# Patient Record
Sex: Female | Born: 1975
Health system: Southern US, Community
[De-identification: ages and names within clinical notes are randomized; demographics above are authoritative.]

## PROBLEM LIST (undated history)

## (undated) DIAGNOSIS — M1990 Unspecified osteoarthritis, unspecified site: Secondary | ICD-10-CM

## (undated) DIAGNOSIS — G4700 Insomnia, unspecified: Secondary | ICD-10-CM

## (undated) DIAGNOSIS — F4481 Dissociative identity disorder: Secondary | ICD-10-CM

## (undated) DIAGNOSIS — F329 Major depressive disorder, single episode, unspecified: Secondary | ICD-10-CM

## (undated) DIAGNOSIS — Z8719 Personal history of other diseases of the digestive system: Secondary | ICD-10-CM

## (undated) DIAGNOSIS — E785 Hyperlipidemia, unspecified: Secondary | ICD-10-CM

## (undated) DIAGNOSIS — G43009 Migraine without aura, not intractable, without status migrainosus: Secondary | ICD-10-CM

## (undated) DIAGNOSIS — J410 Simple chronic bronchitis: Secondary | ICD-10-CM

## (undated) DIAGNOSIS — Z973 Presence of spectacles and contact lenses: Secondary | ICD-10-CM

## (undated) DIAGNOSIS — R0600 Dyspnea, unspecified: Secondary | ICD-10-CM

## (undated) DIAGNOSIS — Z9641 Presence of insulin pump (external) (internal): Secondary | ICD-10-CM

## (undated) DIAGNOSIS — Z7901 Long term (current) use of anticoagulants: Secondary | ICD-10-CM

## (undated) DIAGNOSIS — K219 Gastro-esophageal reflux disease without esophagitis: Secondary | ICD-10-CM

## (undated) DIAGNOSIS — E349 Endocrine disorder, unspecified: Secondary | ICD-10-CM

## (undated) DIAGNOSIS — I10 Essential (primary) hypertension: Secondary | ICD-10-CM

## (undated) DIAGNOSIS — I209 Angina pectoris, unspecified: Secondary | ICD-10-CM

## (undated) DIAGNOSIS — G63 Polyneuropathy in diseases classified elsewhere: Secondary | ICD-10-CM

## (undated) DIAGNOSIS — F411 Generalized anxiety disorder: Secondary | ICD-10-CM

## (undated) DIAGNOSIS — I639 Cerebral infarction, unspecified: Secondary | ICD-10-CM

## (undated) DIAGNOSIS — G5600 Carpal tunnel syndrome, unspecified upper limb: Secondary | ICD-10-CM

## (undated) DIAGNOSIS — Z789 Other specified health status: Secondary | ICD-10-CM

## (undated) DIAGNOSIS — G4730 Sleep apnea, unspecified: Secondary | ICD-10-CM

## (undated) DIAGNOSIS — F32A Depression, unspecified: Secondary | ICD-10-CM

## (undated) DIAGNOSIS — Z0282 Encounter for adoption services: Secondary | ICD-10-CM

## (undated) DIAGNOSIS — F419 Anxiety disorder, unspecified: Secondary | ICD-10-CM

## (undated) DIAGNOSIS — E282 Polycystic ovarian syndrome: Secondary | ICD-10-CM

## (undated) DIAGNOSIS — K580 Irritable bowel syndrome with diarrhea: Secondary | ICD-10-CM

## (undated) DIAGNOSIS — K56609 Unspecified intestinal obstruction, unspecified as to partial versus complete obstruction: Secondary | ICD-10-CM

## (undated) DIAGNOSIS — E1143 Type 2 diabetes mellitus with diabetic autonomic (poly)neuropathy: Secondary | ICD-10-CM

## (undated) DIAGNOSIS — I499 Cardiac arrhythmia, unspecified: Secondary | ICD-10-CM

## (undated) DIAGNOSIS — I493 Ventricular premature depolarization: Secondary | ICD-10-CM

## (undated) DIAGNOSIS — G4733 Obstructive sleep apnea (adult) (pediatric): Secondary | ICD-10-CM

## (undated) DIAGNOSIS — M542 Cervicalgia: Secondary | ICD-10-CM

## (undated) DIAGNOSIS — E039 Hypothyroidism, unspecified: Secondary | ICD-10-CM

## (undated) DIAGNOSIS — K7581 Nonalcoholic steatohepatitis (NASH): Secondary | ICD-10-CM

## (undated) DIAGNOSIS — G43909 Migraine, unspecified, not intractable, without status migrainosus: Secondary | ICD-10-CM

## (undated) DIAGNOSIS — Z8619 Personal history of other infectious and parasitic diseases: Secondary | ICD-10-CM

## (undated) HISTORY — DX: Endocrine disorder, unspecified: E34.9

## (undated) HISTORY — DX: Hypothyroidism, unspecified: E03.9

## (undated) HISTORY — DX: Cardiac arrhythmia, unspecified: I49.9

## (undated) HISTORY — DX: Depression, unspecified: F32.A

## (undated) HISTORY — DX: Major depressive disorder, single episode, unspecified: F32.9

## (undated) HISTORY — DX: Sleep apnea, unspecified: G47.30

## (undated) HISTORY — DX: Morbid (severe) obesity due to excess calories: E66.01

## (undated) HISTORY — DX: Cervicalgia: M54.2

## (undated) HISTORY — DX: Anxiety disorder, unspecified: F41.9

## (undated) HISTORY — DX: Unspecified osteoarthritis, unspecified site: M19.90

## (undated) HISTORY — DX: Migraine, unspecified, not intractable, without status migrainosus: G43.909

## (undated) HISTORY — DX: Unspecified intestinal obstruction, unspecified as to partial versus complete obstruction: K56.609

## (undated) HISTORY — DX: Carpal tunnel syndrome, unspecified upper limb: G56.00

## (undated) HISTORY — DX: Cerebral infarction, unspecified: I63.9

## (undated) HISTORY — DX: Essential (primary) hypertension: I10

## (undated) HISTORY — DX: Hyperlipidemia, unspecified: E78.5

## (undated) HISTORY — DX: Insomnia, unspecified: G47.00

## (undated) HISTORY — DX: Polyneuropathy in diseases classified elsewhere: G63

## (undated) HISTORY — DX: Gastro-esophageal reflux disease without esophagitis: K21.9

---
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
## 1994-10-30 HISTORY — PX: INDUCED ABORTION: SHX677

## 1996-10-30 HISTORY — PX: LAPAROSCOPIC ENDOMETRIOSIS FULGURATION: SUR769

## 1998-12-21 ENCOUNTER — Encounter: Admission: RE | Admit: 1998-12-21 | Discharge: 1998-12-29 | Payer: Self-pay

## 1999-05-17 ENCOUNTER — Other Ambulatory Visit: Admission: RE | Admit: 1999-05-17 | Discharge: 1999-05-17 | Payer: Self-pay | Admitting: Obstetrics and Gynecology

## 1999-06-21 ENCOUNTER — Encounter (INDEPENDENT_AMBULATORY_CARE_PROVIDER_SITE_OTHER): Payer: Self-pay | Admitting: Specialist

## 1999-06-21 ENCOUNTER — Other Ambulatory Visit: Admission: RE | Admit: 1999-06-21 | Discharge: 1999-06-21 | Payer: Self-pay | Admitting: *Deleted

## 1999-10-26 ENCOUNTER — Other Ambulatory Visit: Admission: RE | Admit: 1999-10-26 | Discharge: 1999-10-26 | Payer: Self-pay | Admitting: *Deleted

## 1999-12-01 ENCOUNTER — Other Ambulatory Visit: Admission: RE | Admit: 1999-12-01 | Discharge: 1999-12-30 | Payer: Self-pay | Admitting: Specialist

## 2000-02-21 ENCOUNTER — Other Ambulatory Visit: Admission: RE | Admit: 2000-02-21 | Discharge: 2000-02-21 | Payer: Self-pay | Admitting: Obstetrics and Gynecology

## 2000-02-24 ENCOUNTER — Emergency Department (HOSPITAL_COMMUNITY): Admission: EM | Admit: 2000-02-24 | Discharge: 2000-02-24 | Payer: Self-pay | Admitting: Emergency Medicine

## 2000-02-24 ENCOUNTER — Encounter: Payer: Self-pay | Admitting: Emergency Medicine

## 2000-02-27 ENCOUNTER — Encounter: Admission: RE | Admit: 2000-02-27 | Discharge: 2000-02-27 | Payer: Self-pay | Admitting: Family Medicine

## 2000-02-27 ENCOUNTER — Encounter: Payer: Self-pay | Admitting: Family Medicine

## 2000-03-06 ENCOUNTER — Encounter: Admission: RE | Admit: 2000-03-06 | Discharge: 2000-04-23 | Payer: Self-pay | Admitting: Family Medicine

## 2000-03-22 ENCOUNTER — Ambulatory Visit (HOSPITAL_COMMUNITY): Admission: RE | Admit: 2000-03-22 | Discharge: 2000-03-22 | Payer: Self-pay | Admitting: Internal Medicine

## 2000-03-22 ENCOUNTER — Encounter: Payer: Self-pay | Admitting: Internal Medicine

## 2000-07-30 ENCOUNTER — Other Ambulatory Visit: Admission: RE | Admit: 2000-07-30 | Discharge: 2000-07-30 | Payer: Self-pay | Admitting: *Deleted

## 2000-08-21 ENCOUNTER — Emergency Department (HOSPITAL_COMMUNITY): Admission: EM | Admit: 2000-08-21 | Discharge: 2000-08-21 | Payer: Self-pay

## 2000-08-21 ENCOUNTER — Encounter: Payer: Self-pay | Admitting: Emergency Medicine

## 2000-08-28 ENCOUNTER — Encounter: Admission: RE | Admit: 2000-08-28 | Discharge: 2000-10-29 | Payer: Self-pay | Admitting: Family Medicine

## 2001-01-14 ENCOUNTER — Other Ambulatory Visit: Admission: RE | Admit: 2001-01-14 | Discharge: 2001-01-14 | Payer: Self-pay | Admitting: *Deleted

## 2001-07-05 ENCOUNTER — Emergency Department (HOSPITAL_COMMUNITY): Admission: EM | Admit: 2001-07-05 | Discharge: 2001-07-06 | Payer: Self-pay | Admitting: Emergency Medicine

## 2001-07-06 ENCOUNTER — Encounter: Payer: Self-pay | Admitting: Emergency Medicine

## 2001-10-26 ENCOUNTER — Ambulatory Visit (HOSPITAL_COMMUNITY): Admission: RE | Admit: 2001-10-26 | Discharge: 2001-10-26 | Payer: Self-pay

## 2002-06-14 ENCOUNTER — Inpatient Hospital Stay (HOSPITAL_COMMUNITY): Admission: AD | Admit: 2002-06-14 | Discharge: 2002-06-20 | Payer: Self-pay | Admitting: Psychiatry

## 2002-09-01 ENCOUNTER — Other Ambulatory Visit: Admission: RE | Admit: 2002-09-01 | Discharge: 2002-09-01 | Payer: Self-pay | Admitting: Obstetrics and Gynecology

## 2002-10-30 DIAGNOSIS — Z794 Long term (current) use of insulin: Secondary | ICD-10-CM

## 2002-10-30 DIAGNOSIS — E119 Type 2 diabetes mellitus without complications: Secondary | ICD-10-CM

## 2002-10-30 HISTORY — DX: Type 2 diabetes mellitus without complications: E11.9

## 2002-10-30 HISTORY — DX: Type 2 diabetes mellitus without complications: Z79.4

## 2003-11-10 ENCOUNTER — Observation Stay (HOSPITAL_COMMUNITY): Admission: EM | Admit: 2003-11-10 | Discharge: 2003-11-11 | Payer: Self-pay

## 2003-11-11 ENCOUNTER — Inpatient Hospital Stay (HOSPITAL_COMMUNITY): Admission: EM | Admit: 2003-11-11 | Discharge: 2003-11-16 | Payer: Self-pay | Admitting: Psychiatry

## 2003-11-17 ENCOUNTER — Other Ambulatory Visit (HOSPITAL_COMMUNITY): Admission: RE | Admit: 2003-11-17 | Discharge: 2003-11-26 | Payer: Self-pay | Admitting: Psychiatry

## 2004-02-15 ENCOUNTER — Encounter: Admission: RE | Admit: 2004-02-15 | Discharge: 2004-05-15 | Payer: Self-pay | Admitting: Family Medicine

## 2004-04-06 ENCOUNTER — Emergency Department (HOSPITAL_COMMUNITY): Admission: EM | Admit: 2004-04-06 | Discharge: 2004-04-06 | Payer: Self-pay | Admitting: Emergency Medicine

## 2004-08-19 ENCOUNTER — Emergency Department (HOSPITAL_COMMUNITY): Admission: EM | Admit: 2004-08-19 | Discharge: 2004-08-19 | Payer: Self-pay | Admitting: Emergency Medicine

## 2005-02-03 ENCOUNTER — Emergency Department (HOSPITAL_COMMUNITY): Admission: EM | Admit: 2005-02-03 | Discharge: 2005-02-03 | Payer: Self-pay | Admitting: Family Medicine

## 2005-10-30 HISTORY — PX: WISDOM TOOTH EXTRACTION: SHX21

## 2006-01-04 ENCOUNTER — Encounter: Admission: RE | Admit: 2006-01-04 | Discharge: 2006-01-04 | Payer: Self-pay | Admitting: Obstetrics and Gynecology

## 2006-08-05 ENCOUNTER — Emergency Department (HOSPITAL_COMMUNITY): Admission: EM | Admit: 2006-08-05 | Discharge: 2006-08-05 | Payer: Self-pay | Admitting: Family Medicine

## 2007-03-11 ENCOUNTER — Encounter: Admission: RE | Admit: 2007-03-11 | Discharge: 2007-03-11 | Payer: Self-pay | Admitting: Internal Medicine

## 2007-06-07 ENCOUNTER — Emergency Department (HOSPITAL_COMMUNITY): Admission: EM | Admit: 2007-06-07 | Discharge: 2007-06-07 | Payer: Self-pay | Admitting: Family Medicine

## 2007-12-31 ENCOUNTER — Emergency Department (HOSPITAL_COMMUNITY): Admission: EM | Admit: 2007-12-31 | Discharge: 2008-01-01 | Payer: Self-pay | Admitting: Emergency Medicine

## 2008-01-22 ENCOUNTER — Emergency Department (HOSPITAL_COMMUNITY): Admission: EM | Admit: 2008-01-22 | Discharge: 2008-01-22 | Payer: Self-pay | Admitting: Emergency Medicine

## 2008-03-03 ENCOUNTER — Encounter: Payer: Self-pay | Admitting: Family Medicine

## 2008-03-06 ENCOUNTER — Encounter: Payer: Self-pay | Admitting: Family Medicine

## 2008-03-08 ENCOUNTER — Encounter: Admission: RE | Admit: 2008-03-08 | Discharge: 2008-03-08 | Payer: Self-pay | Admitting: Internal Medicine

## 2008-03-15 ENCOUNTER — Encounter: Payer: Self-pay | Admitting: Family Medicine

## 2008-03-17 ENCOUNTER — Ambulatory Visit: Payer: Self-pay | Admitting: Family Medicine

## 2008-03-17 DIAGNOSIS — R51 Headache: Secondary | ICD-10-CM

## 2008-03-17 DIAGNOSIS — F3289 Other specified depressive episodes: Secondary | ICD-10-CM | POA: Insufficient documentation

## 2008-03-17 DIAGNOSIS — J45909 Unspecified asthma, uncomplicated: Secondary | ICD-10-CM | POA: Insufficient documentation

## 2008-03-17 DIAGNOSIS — K219 Gastro-esophageal reflux disease without esophagitis: Secondary | ICD-10-CM | POA: Insufficient documentation

## 2008-03-17 DIAGNOSIS — I10 Essential (primary) hypertension: Secondary | ICD-10-CM | POA: Insufficient documentation

## 2008-03-17 DIAGNOSIS — R519 Headache, unspecified: Secondary | ICD-10-CM | POA: Insufficient documentation

## 2008-03-17 DIAGNOSIS — G589 Mononeuropathy, unspecified: Secondary | ICD-10-CM | POA: Insufficient documentation

## 2008-03-17 DIAGNOSIS — E039 Hypothyroidism, unspecified: Secondary | ICD-10-CM | POA: Insufficient documentation

## 2008-03-17 DIAGNOSIS — E785 Hyperlipidemia, unspecified: Secondary | ICD-10-CM | POA: Insufficient documentation

## 2008-03-17 DIAGNOSIS — F329 Major depressive disorder, single episode, unspecified: Secondary | ICD-10-CM

## 2008-03-20 ENCOUNTER — Encounter: Payer: Self-pay | Admitting: Family Medicine

## 2008-04-07 ENCOUNTER — Encounter: Payer: Self-pay | Admitting: Family Medicine

## 2008-04-09 ENCOUNTER — Telehealth: Payer: Self-pay | Admitting: Family Medicine

## 2008-04-14 ENCOUNTER — Telehealth: Payer: Self-pay | Admitting: Family Medicine

## 2008-04-14 ENCOUNTER — Ambulatory Visit: Payer: Self-pay | Admitting: Family Medicine

## 2008-04-14 DIAGNOSIS — M25569 Pain in unspecified knee: Secondary | ICD-10-CM | POA: Insufficient documentation

## 2008-05-06 ENCOUNTER — Encounter: Payer: Self-pay | Admitting: Family Medicine

## 2008-05-08 ENCOUNTER — Ambulatory Visit: Payer: Self-pay | Admitting: Family Medicine

## 2008-05-09 ENCOUNTER — Encounter: Admission: RE | Admit: 2008-05-09 | Discharge: 2008-05-09 | Payer: Self-pay | Admitting: Orthopedic Surgery

## 2008-05-13 ENCOUNTER — Encounter: Payer: Self-pay | Admitting: Family Medicine

## 2008-05-20 ENCOUNTER — Encounter: Payer: Self-pay | Admitting: Family Medicine

## 2008-08-21 ENCOUNTER — Ambulatory Visit: Payer: Self-pay | Admitting: Family Medicine

## 2008-08-21 DIAGNOSIS — N63 Unspecified lump in unspecified breast: Secondary | ICD-10-CM | POA: Insufficient documentation

## 2008-08-26 ENCOUNTER — Encounter: Admission: RE | Admit: 2008-08-26 | Discharge: 2008-08-26 | Payer: Self-pay | Admitting: Family Medicine

## 2008-09-14 ENCOUNTER — Encounter: Payer: Self-pay | Admitting: Family Medicine

## 2008-10-28 ENCOUNTER — Encounter: Payer: Self-pay | Admitting: Family Medicine

## 2008-11-20 ENCOUNTER — Emergency Department (HOSPITAL_COMMUNITY): Admission: EM | Admit: 2008-11-20 | Discharge: 2008-11-20 | Payer: Self-pay | Admitting: Emergency Medicine

## 2008-11-24 ENCOUNTER — Telehealth: Payer: Self-pay | Admitting: Family Medicine

## 2008-11-24 ENCOUNTER — Ambulatory Visit: Payer: Self-pay | Admitting: Family Medicine

## 2008-11-24 ENCOUNTER — Ambulatory Visit: Admission: RE | Admit: 2008-11-24 | Discharge: 2008-11-24 | Payer: Self-pay | Admitting: Family Medicine

## 2008-11-24 LAB — CONVERTED CEMR LAB
Blood in Urine, dipstick: NEGATIVE
Nitrite: NEGATIVE
Protein, U semiquant: NEGATIVE
Specific Gravity, Urine: 1.02

## 2008-11-25 ENCOUNTER — Telehealth: Payer: Self-pay | Admitting: Family Medicine

## 2008-11-25 ENCOUNTER — Encounter: Payer: Self-pay | Admitting: Family Medicine

## 2008-11-25 LAB — CONVERTED CEMR LAB
AST: 22 units/L (ref 0–37)
Alkaline Phosphatase: 34 units/L — ABNORMAL LOW (ref 39–117)
BUN: 12 mg/dL (ref 6–23)
Creatinine, Ser: 0.68 mg/dL (ref 0.40–1.20)
Eosinophils Absolute: 0.1 10*3/uL (ref 0.0–0.7)
HCT: 37.1 % (ref 36.0–46.0)
Hemoglobin: 13 g/dL (ref 12.0–15.0)
Lymphocytes Relative: 25 % (ref 12–46)
Lymphs Abs: 2.6 10*3/uL (ref 0.7–4.0)
MCHC: 35 g/dL (ref 30.0–36.0)
MCV: 85.1 fL (ref 78.0–100.0)
Monocytes Relative: 7 % (ref 3–12)
Neutro Abs: 6.8 10*3/uL (ref 1.7–7.7)
Neutrophils Relative %: 66 % (ref 43–77)
Total Bilirubin: 0.4 mg/dL (ref 0.3–1.2)

## 2008-12-01 ENCOUNTER — Encounter: Payer: Self-pay | Admitting: Family Medicine

## 2008-12-07 ENCOUNTER — Encounter: Payer: Self-pay | Admitting: Family Medicine

## 2009-01-22 ENCOUNTER — Encounter: Payer: Self-pay | Admitting: Family Medicine

## 2009-03-15 ENCOUNTER — Encounter: Payer: Self-pay | Admitting: Family Medicine

## 2009-06-08 ENCOUNTER — Ambulatory Visit: Payer: Self-pay | Admitting: Family Medicine

## 2009-06-28 ENCOUNTER — Ambulatory Visit: Payer: Self-pay | Admitting: Family Medicine

## 2009-06-28 DIAGNOSIS — G43909 Migraine, unspecified, not intractable, without status migrainosus: Secondary | ICD-10-CM | POA: Insufficient documentation

## 2009-07-07 ENCOUNTER — Encounter: Payer: Self-pay | Admitting: Family Medicine

## 2009-07-23 ENCOUNTER — Encounter: Payer: Self-pay | Admitting: Family Medicine

## 2009-10-21 ENCOUNTER — Encounter: Payer: Self-pay | Admitting: Family Medicine

## 2009-10-25 ENCOUNTER — Encounter: Payer: Self-pay | Admitting: Family Medicine

## 2009-10-30 HISTORY — PX: CHOLECYSTECTOMY: SHX55

## 2009-12-27 ENCOUNTER — Ambulatory Visit: Payer: Self-pay | Admitting: Family Medicine

## 2009-12-28 ENCOUNTER — Telehealth: Payer: Self-pay | Admitting: Family Medicine

## 2009-12-28 ENCOUNTER — Encounter: Admission: RE | Admit: 2009-12-28 | Discharge: 2009-12-28 | Payer: Self-pay | Admitting: Family Medicine

## 2009-12-30 ENCOUNTER — Encounter (INDEPENDENT_AMBULATORY_CARE_PROVIDER_SITE_OTHER): Payer: Self-pay | Admitting: *Deleted

## 2010-01-26 ENCOUNTER — Encounter: Payer: Self-pay | Admitting: Family Medicine

## 2010-01-26 ENCOUNTER — Ambulatory Visit: Payer: Self-pay | Admitting: Gastroenterology

## 2010-01-26 DIAGNOSIS — K7689 Other specified diseases of liver: Secondary | ICD-10-CM | POA: Insufficient documentation

## 2010-01-26 DIAGNOSIS — K766 Portal hypertension: Secondary | ICD-10-CM | POA: Insufficient documentation

## 2010-01-26 DIAGNOSIS — R112 Nausea with vomiting, unspecified: Secondary | ICD-10-CM | POA: Insufficient documentation

## 2010-01-27 ENCOUNTER — Encounter: Payer: Self-pay | Admitting: Gastroenterology

## 2010-01-27 ENCOUNTER — Encounter: Payer: Self-pay | Admitting: Family Medicine

## 2010-02-14 ENCOUNTER — Encounter: Payer: Self-pay | Admitting: Family Medicine

## 2010-02-14 ENCOUNTER — Ambulatory Visit: Payer: Self-pay | Admitting: Gastroenterology

## 2010-02-15 ENCOUNTER — Ambulatory Visit (HOSPITAL_COMMUNITY): Admission: RE | Admit: 2010-02-15 | Discharge: 2010-02-15 | Payer: Self-pay | Admitting: Gastroenterology

## 2010-02-15 LAB — CONVERTED CEMR LAB
Fecal Occult Blood: NEGATIVE
OCCULT 1: NEGATIVE
OCCULT 3: NEGATIVE
OCCULT 4: NEGATIVE

## 2010-03-01 ENCOUNTER — Telehealth: Payer: Self-pay | Admitting: Gastroenterology

## 2010-03-02 ENCOUNTER — Encounter: Payer: Self-pay | Admitting: Gastroenterology

## 2010-04-01 ENCOUNTER — Encounter: Payer: Self-pay | Admitting: Family Medicine

## 2010-04-11 ENCOUNTER — Telehealth: Payer: Self-pay | Admitting: Gastroenterology

## 2010-04-11 ENCOUNTER — Encounter: Payer: Self-pay | Admitting: Gastroenterology

## 2010-04-19 ENCOUNTER — Ambulatory Visit: Payer: Self-pay | Admitting: Family Medicine

## 2010-04-29 ENCOUNTER — Encounter: Payer: Self-pay | Admitting: Family Medicine

## 2010-05-05 ENCOUNTER — Telehealth: Payer: Self-pay | Admitting: Family Medicine

## 2010-05-10 ENCOUNTER — Encounter: Payer: Self-pay | Admitting: Family Medicine

## 2010-05-10 ENCOUNTER — Encounter: Payer: Self-pay | Admitting: Gastroenterology

## 2010-05-13 ENCOUNTER — Encounter (INDEPENDENT_AMBULATORY_CARE_PROVIDER_SITE_OTHER): Payer: Self-pay | Admitting: General Surgery

## 2010-05-13 ENCOUNTER — Ambulatory Visit (HOSPITAL_COMMUNITY): Admission: RE | Admit: 2010-05-13 | Discharge: 2010-05-14 | Payer: Self-pay | Admitting: General Surgery

## 2010-05-13 HISTORY — PX: CHOLECYSTECTOMY, LAPAROSCOPIC: SHX56

## 2010-06-02 ENCOUNTER — Encounter: Payer: Self-pay | Admitting: Family Medicine

## 2010-06-08 ENCOUNTER — Ambulatory Visit: Payer: Self-pay | Admitting: Family Medicine

## 2010-06-09 ENCOUNTER — Encounter: Payer: Self-pay | Admitting: Family Medicine

## 2010-06-17 ENCOUNTER — Telehealth: Payer: Self-pay | Admitting: Family Medicine

## 2010-06-20 ENCOUNTER — Encounter: Payer: Self-pay | Admitting: Family Medicine

## 2010-07-13 ENCOUNTER — Telehealth: Payer: Self-pay | Admitting: Family Medicine

## 2010-08-09 ENCOUNTER — Ambulatory Visit: Payer: Self-pay | Admitting: Family Medicine

## 2010-08-18 ENCOUNTER — Encounter: Payer: Self-pay | Admitting: Family Medicine

## 2010-09-30 ENCOUNTER — Ambulatory Visit: Payer: Self-pay | Admitting: Family Medicine

## 2010-11-21 ENCOUNTER — Ambulatory Visit
Admission: RE | Admit: 2010-11-21 | Discharge: 2010-11-21 | Payer: Self-pay | Source: Home / Self Care | Attending: Family Medicine | Admitting: Family Medicine

## 2010-11-21 DIAGNOSIS — M542 Cervicalgia: Secondary | ICD-10-CM | POA: Insufficient documentation

## 2010-11-25 ENCOUNTER — Encounter: Payer: Self-pay | Admitting: Gastroenterology

## 2010-11-27 LAB — CONVERTED CEMR LAB
ALT: 45 units/L — ABNORMAL HIGH (ref 0–35)
AST: 37 units/L (ref 0–37)
Albumin: 3.7 g/dL (ref 3.5–5.2)
Alkaline Phosphatase: 36 units/L — ABNORMAL LOW (ref 39–117)
CO2: 28 meq/L (ref 19–32)
Creatinine, Ser: 0.7 mg/dL (ref 0.4–1.2)
Eosinophils Absolute: 0.1 10*3/uL (ref 0.0–0.7)
Eosinophils Relative: 1.4 % (ref 0.0–5.0)
GFR calc non Af Amer: 100.74 mL/min (ref >60)
Lipase: 19 units/L (ref 11.0–59.0)
Lymphocytes Relative: 28.3 % (ref 12.0–46.0)
Lymphs Abs: 2.9 10*3/uL (ref 0.7–4.0)
MCHC: 32.5 g/dL (ref 30.0–36.0)
MCV: 93.5 fL (ref 78.0–100.0)
Monocytes Relative: 6.9 % (ref 3.0–12.0)
Neutrophils Relative %: 62.5 % (ref 43.0–77.0)
Potassium: 4 meq/L (ref 3.5–5.1)
Sodium: 143 meq/L (ref 135–145)
TSH: 3.06 microintl units/mL (ref 0.35–5.50)

## 2010-11-29 NOTE — Letter (Signed)
Summary: [facility] Endocrinology and Diabetes  [facility] Endocrinology and Diabetes   Imported By: Maryln Gottron 11/04/2009 11:16:32  _____________________________________________________________________  External Attachment:    Type:   Image     Comment:   External Document

## 2010-11-29 NOTE — Medication Information (Signed)
Summary: Approval for Aciphex  Approval for Aciphex   Imported By: Maryln Gottron 06/22/2010 12:29:25  _____________________________________________________________________  External Attachment:    Type:   Image     Comment:   External Document

## 2010-11-29 NOTE — Assessment & Plan Note (Signed)
Summary: congestion//ccm   Vital Signs:  Patient profile:   35 year old female Weight:      247 pounds O2 Sat:      98 % Temp:     98.9 degrees F Pulse rate:   99 / minute BP sitting:   122 / 86  (left arm) Cuff size:   large  Vitals Entered By: Pura Spice, RN (September 30, 2010 8:56 AM) CC: cough congestion had sinus inf last wk now into chest . stated she had heard some wheezing    History of Present Illness: Here for one week of chest congestion and coughing up yellow sputum. No fever. On Mucinex and using her inhalers.   Allergies: 1)  ! Lortab 2)  ! Penicillin 3)  ! Ultram 4)  ! * Byetta  Past History:  Past Medical History: Reviewed history from 04/19/2010 and no changes required. Asthma Diabetes mellitus, type II, sees Dr. Leslie Dales (on insulin pump) GERD Headache Hyperlipidemia Hypertension Hypothyroidism Depression, sees Dr. Clayborn Heron in [place]. Also sees a therapist regularly (Dr. Evalina Field) Chickenpox Eating disorder, hx of anorexia sees Julio Sicks for GYN exams carpal tunnel, bilateral proven by EMG/NCV , sees Dr. Vickey Huger torn medial meniscus right knee 2003  Review of Systems  The patient denies anorexia, fever, weight loss, weight gain, vision loss, decreased hearing, hoarseness, chest pain, syncope, dyspnea on exertion, peripheral edema, headaches, hemoptysis, abdominal pain, melena, hematochezia, severe indigestion/heartburn, hematuria, incontinence, genital sores, muscle weakness, suspicious skin lesions, transient blindness, difficulty walking, depression, unusual weight change, abnormal bleeding, enlarged lymph nodes, angioedema, breast masses, and testicular masses.    Physical Exam  General:  Well-developed,well-nourished,in no acute distress; alert,appropriate and cooperative throughout examination Head:  Normocephalic and atraumatic without obvious abnormalities. No apparent alopecia or balding. Eyes:  No corneal or  conjunctival inflammation noted. EOMI. Perrla. Funduscopic exam benign, without hemorrhages, exudates or papilledema. Vision grossly normal. Ears:  External ear exam shows no significant lesions or deformities.  Otoscopic examination reveals clear canals, tympanic membranes are intact bilaterally without bulging, retraction, inflammation or discharge. Hearing is grossly normal bilaterally. Nose:  External nasal examination shows no deformity or inflammation. Nasal mucosa are pink and moist without lesions or exudates. Mouth:  Oral mucosa and oropharynx without lesions or exudates.  Teeth in good repair. Neck:  No deformities, masses, or tenderness noted. Lungs:  scattered wheezes and rhonchi   Impression & Recommendations:  Problem # 1:  ACUTE BRONCHITIS (ICD-466.0)  Her updated medication list for this problem includes:    Proventil Hfa 108 (90 Base) Mcg/act Aers (Albuterol sulfate) .Marland Kitchen... 2 puffs every 4 hours as needed sob    Symbicort 160-4.5 Mcg/act Aero (Budesonide-formoterol fumarate) .Marland Kitchen... 2 puffs two times a day    Zithromax Z-pak 250 Mg Tabs (Azithromycin) .Marland Kitchen... As directed  Complete Medication List: 1)  Crestor 40 Mg Tabs (Rosuvastatin calcium) .Marland Kitchen.. 1 tablet by mouth daily 2)  Synthroid 125 Mcg Tabs (Levothyroxine sodium) .Marland Kitchen.. 1 by mouth once daily 3)  Lovaza 1 Gm Caps (Omega-3-acid ethyl esters) .... 2 caps two times a day 4)  Januvia 100 Mg Tabs (Sitagliptin phosphate) .... Take 1 tab by mouth daily 5)  Actos 45 Mg Tabs (Pioglitazone hcl) .Marland Kitchen.. 1 by mouth daily 6)  Sertraline Hcl 100 Mg Tabs (Sertraline hcl) .... 2 by mouth daily 7)  Furosemide 20 Mg Tabs (Furosemide) .... Once daily 8)  Risperdal 0.5 Mg Tabs (Risperidone) .... One in am 9)  Vitamin D 1000 Unit  Tabs (Cholecalciferol) .... 2 tabs once daily 10)  Vitamin E 600 Unit Caps (Vitamin e) .Marland Kitchen.. 1 by mouth once daily 11)  Vitamin B Complex-c Caps (B complex-c) .Marland Kitchen.. 1 by mouth once daily 12)  Vitamin C 500 Mg Tabs  (Ascorbic acid) .... 2 by mouth once daily 13)  Biotin 1000 Mcg Tabs (Biotin) .Marland Kitchen.. 1 by mouth at bedtime 14)  Humalog 100 Unit/ml Soln (Insulin lispro (human)) .... Via insulin pump as directed 15)  Proventil Hfa 108 (90 Base) Mcg/act Aers (Albuterol sulfate) .... 2 puffs every 4 hours as needed sob 16)  Metformin Hcl 1000 Mg Tabs (Metformin hcl) .... Two times a day 17)  Promethazine Hcl 25 Mg Tabs (Promethazine hcl) .Marland Kitchen.. 1 q 4 hours as needed nausea 18)  Topiramate 50 Mg Tabs (Topiramate) .... At bedtime 19)  Loestrin 24 Fe 1-20 Mg-mcg Tabs (Norethin ace-eth estrad-fe) .... As directed 20)  Cetirizine Hcl 10 Mg Tabs (Cetirizine hcl) .... Every morning 21)  Alpha-lipoic Acid 600 Mg Caps (Alpha-lipoic acid) .... Every morning 22)  Trazodone Hcl 100 Mg Tabs (Trazodone hcl) .... 2 at bedtime 23)  Levsin/sl 0.125 Mg Subl (Hyoscyamine sulfate) .Marland Kitchen.. 1-2 tablets by mouth four times a day as needed 24)  Colace 100 Mg Caps (Docusate sodium) .Marland Kitchen.. 1 once daily 25)  Aspir-low 81 Mg Tbec (Aspirin) .Marland Kitchen.. 1 once daily 26)  Hydroxyzine Hcl 50 Mg Tabs (Hydroxyzine hcl) .Marland Kitchen.. 1 q6h as needed 27)  Risperdal 2 Mg Tabs (Risperidone) .Marland Kitchen.. 1 at bedtime 28)  Prazosin Hcl 2 Mg Caps (Prazosin hcl) .... 3 at bedtime 29)  Budeprion Xl 300 Mg Xr24h-tab (Bupropion hcl) .Marland Kitchen.. 1 once daily 30)  Metoprolol Succinate 25 Mg Xr24h-tab (Metoprolol succinate) .... Once daily 31)  Omeprazole 20 Mg Cpdr (Omeprazole) .Marland Kitchen.. 1 two times a day 32)  Abilify 2 Mg Tabs (Aripiprazole) .... Qd 33)  Symbicort 160-4.5 Mcg/act Aero (Budesonide-formoterol fumarate) .... 2 puffs two times a day 34)  Klor-con M20 20 Meq Cr-tabs (Potassium chloride crys cr) .... Once daily 35)  Zithromax Z-pak 250 Mg Tabs (Azithromycin) .... As directed  Patient Instructions: 1)  Please schedule a follow-up appointment as needed .  Prescriptions: ZITHROMAX Z-PAK 250 MG TABS (AZITHROMYCIN) as directed  #1 x 0   Entered and Authorized by:   Nelwyn Salisbury MD    Signed by:   Nelwyn Salisbury MD on 09/30/2010   Method used:   [facility]        [facility] [phone]* (retail)       [address]       [place], [place]  [address]       Ph: [phone]       Fax: [phone]   RxID:   [id number]    Orders Added: 1)  Est. Patient Level IV [24401]

## 2010-11-29 NOTE — Letter (Signed)
Summary: [facility] Endocrinology & Diabetes  [facility] Endocrinology & Diabetes   Imported By: Maryln Gottron 08/23/2010 14:25:07  _____________________________________________________________________  External Attachment:    Type:   Image     Comment:   External Document

## 2010-11-29 NOTE — Letter (Signed)
Summary: New Patient letter  [facility]astroenterology  [address] [place], [place] [address]   Phone: [phone]  Fax: [phone]       12/30/2009 MRN: [id number]  [facility] [address] [place], [place]  [address]  Dear Ms. Miranda [name],  Welcome to the Gastroenterology Division at [facility].    You are scheduled to see Dr.  Russella Dar on 01-26-10   at 2:30pm on the 3rd floor at [facility], [address]  We ask that you try to arrive at our office 15 minutes prior to your appointment time to allow for check-in.  We would like you to complete the enclosed self-administered evaluation form prior to your visit and bring it with you on the day of your appointment.  We will review it with you.  Also, please bring a complete list of all your medications or, if you prefer, bring the medication bottles and we will list them.  Please bring your insurance card so that we may make a copy of it.  If your insurance requires a referral to see a specialist, please bring your referral form from your primary care physician.  Co-payments are due at the time of your visit and may be paid by cash, check or credit card.     Your office visit will consist of a consult with your physician (includes a physical exam), any laboratory testing he/she may order, scheduling of any necessary diagnostic testing (e.g. x-ray, ultrasound, CT-scan), and scheduling of a procedure (e.g. Endoscopy, Colonoscopy) if required.  Please allow enough time on your schedule to allow for any/all of these possibilities.    If you cannot keep your appointment, please call [phone] to cancel or reschedule prior to your appointment date.  This allows [place] the opportunity to schedule an appointment for another patient in need of care.  If you do not cancel or reschedule by 5 p.m. the business day prior to your appointment date, you will be charged a $50.00 late cancellation/no-show fee.    Thank you for  choosing [facility] for your medical needs.  We appreciate the opportunity to care for you.  Please visit [place] at our website  to learn more about our practice.                     Sincerely,                                                             The Gastroenterology Division

## 2010-11-29 NOTE — Progress Notes (Signed)
Summary: REFILL REQUEST  Phone Note Refill Request Message from:  Patient on May 05, 2010 12:00 PM  Refills Requested: Medication #1:  KLOR-CON 20 MEQ PACK once daily   [facility]    Initial call taken by: Debbra Riding,  May 05, 2010 12:01 PM    Prescriptions: KLOR-CON 20 MEQ PACK (POTASSIUM CHLORIDE) once daily  #30 x 5   Entered by:   Raechel Ache, RN   Authorized by:   Nelwyn Salisbury MD   Signed by:   Raechel Ache, RN on 05/05/2010   Method used:   Electronically to        [facility] [phone]* (retail)       [address]       [place], [place]  [address]       Ph: [phone]       Fax: [phone]   RxID:   [id number]

## 2010-11-29 NOTE — Letter (Signed)
Summary: [facility] Endocrinology & Diabetes  [facility] Endocrinology & Diabetes   Imported By: Sherian Rein 02/03/2010 14:17:12  _____________________________________________________________________  External Attachment:    Type:   Image     Comment:   External Document

## 2010-11-29 NOTE — Progress Notes (Signed)
Summary: rx to [facility]  Phone Note From Pharmacy   Caller: [facility] Reason for Call: Needs renewal Summary of Call: refill 90 day supply omperazole, furosemide  klor-con tabs Initial call taken by: Pura Spice, RN,  July 13, 2010 10:34 AM  Follow-up for Phone Call        ok per dr Elane Peabody Follow-up by: Pura Spice, RN,  July 13, 2010 10:35 AM    New/Updated Medications: FUROSEMIDE 20 MG TABS (FUROSEMIDE) once daily Prescriptions: KLOR-CON 20 MEQ PACK (POTASSIUM CHLORIDE) once daily  #90 x 4   Entered by:   Pura Spice, RN   Authorized by:   Nelwyn Salisbury MD   Signed by:   Pura Spice, RN on 07/13/2010   Method used:   Faxed to ...       [facility] (mail-order)             , [place]         Ph: [phone]       Fax: [phone]   RxID:   [id number] OMEPRAZOLE 20 MG CPDR (OMEPRAZOLE) 1 two times a day  #180 x 4   Entered by:   Pura Spice, RN   Authorized by:   Nelwyn Salisbury MD   Signed by:   Pura Spice, RN on 07/13/2010   Method used:   Faxed to ...       [facility] (mail-order)             , [place]         Ph: [phone]       Fax: [phone]   RxID:   [id number] FUROSEMIDE 20 MG TABS (FUROSEMIDE) once daily  #90 x 4   Entered by:   Pura Spice, RN   Authorized by:   Nelwyn Salisbury MD   Signed by:   Pura Spice, RN on 07/13/2010   Method used:   Faxed to ...       [facility] (mail-order)             , [place]         Ph: [phone]       Fax: [phone]   RxID:   [id number]

## 2010-11-29 NOTE — Letter (Signed)
Summary: [facility]   Imported By: Lester Greenwood 05/03/2010 11:59:24  _____________________________________________________________________  External Attachment:    Type:   Image     Comment:   External Document

## 2010-11-29 NOTE — Assessment & Plan Note (Signed)
Summary: med refill on inhaler/pt wheezing/cjr   Vital Signs:  Patient profile:   35 year old female Weight:      252 pounds O2 Sat:      98 % Temp:     98.7 degrees F Pulse rate:   99 / minute BP sitting:   124 / 82  (left arm) Cuff size:   large  Vitals Entered By: Pura Spice, RN (August 09, 2010 3:32 PM) CC: chest congestion wheezing    History of Present Illness: Here for refills on asthma meds. She says she has been out of all inhalers for the past year, but did not feel like she needed them until recently. Now for several days she has had some chest congestion, a dry cough, and some wheezing. No fever. She does not feel like she has an infection.   Allergies: 1)  ! Lortab 2)  ! Penicillin 3)  ! Ultram 4)  ! * Byetta  Past History:  Past Medical History: Reviewed history from 04/19/2010 and no changes required. Asthma Diabetes mellitus, type II, sees Dr. Leslie Dales (on insulin pump) GERD Headache Hyperlipidemia Hypertension Hypothyroidism Depression, sees Dr. Clayborn Heron in [place]. Also sees a therapist regularly (Dr. Evalina Field) Chickenpox Eating disorder, hx of anorexia sees Julio Sicks for GYN exams carpal tunnel, bilateral proven by EMG/NCV , sees Dr. Vickey Huger torn medial meniscus right knee 2003  Past Surgical History: Reviewed history from 03/17/2008 and no changes required. Blocked intestinal repair at 4 months of age Left inguinal  hernia repair at age 38 years Laparoscopy to rule out endometriosis 1998  Review of Systems  The patient denies anorexia, fever, weight loss, weight gain, vision loss, decreased hearing, hoarseness, chest pain, syncope, dyspnea on exertion, peripheral edema, headaches, hemoptysis, abdominal pain, melena, hematochezia, severe indigestion/heartburn, hematuria, incontinence, genital sores, muscle weakness, suspicious skin lesions, transient blindness, difficulty walking, depression, unusual weight change, abnormal  bleeding, enlarged lymph nodes, angioedema, breast masses, and testicular masses.    Physical Exam  General:  Well-developed,well-nourished,in no acute distress; alert,appropriate and cooperative throughout examination Head:  Normocephalic and atraumatic without obvious abnormalities. No apparent alopecia or balding. Eyes:  No corneal or conjunctival inflammation noted. EOMI. Perrla. Funduscopic exam benign, without hemorrhages, exudates or papilledema. Vision grossly normal. Ears:  External ear exam shows no significant lesions or deformities.  Otoscopic examination reveals clear canals, tympanic membranes are intact bilaterally without bulging, retraction, inflammation or discharge. Hearing is grossly normal bilaterally. Nose:  External nasal examination shows no deformity or inflammation. Nasal mucosa are pink and moist without lesions or exudates. Mouth:  Oral mucosa and oropharynx without lesions or exudates.  Teeth in good repair. Neck:  No deformities, masses, or tenderness noted. Lungs:  soft scattered wheezes Heart:  Normal rate and regular rhythm. S1 and S2 normal without gallop, murmur, click, rub or other extra sounds.   Impression & Recommendations:  Problem # 1:  ASTHMA (ICD-493.90)  The following medications were removed from the medication list:    Advair Diskus 250-50 Mcg/dose Misc (Fluticasone-salmeterol) ..... One puff two times a day Her updated medication list for this problem includes:    Proventil Hfa 108 (90 Base) Mcg/act Aers (Albuterol sulfate) .Marland Kitchen... 2 puffs every 4 hours as needed sob    Symbicort 160-4.5 Mcg/act Aero (Budesonide-formoterol fumarate) .Marland Kitchen... 2 puffs two times a day  Complete Medication List: 1)  Crestor 40 Mg Tabs (Rosuvastatin calcium) .Marland Kitchen.. 1 tablet by mouth daily 2)  Synthroid 125 Mcg Tabs (Levothyroxine sodium) .Marland KitchenMarland KitchenMarland Kitchen  1 by mouth once daily 3)  Lovaza 1 Gm Caps (Omega-3-acid ethyl esters) .... 2 caps two times a day 4)  Januvia 100 Mg Tabs  (Sitagliptin phosphate) .... Take 1 tab by mouth daily 5)  Actos 45 Mg Tabs (Pioglitazone hcl) .Marland Kitchen.. 1 by mouth daily 6)  Sertraline Hcl 100 Mg Tabs (Sertraline hcl) .... 2 by mouth daily 7)  Furosemide 20 Mg Tabs (Furosemide) .... Once daily 8)  Risperdal 0.5 Mg Tabs (Risperidone) .... One in am 9)  Vitamin D 1000 Unit Tabs (Cholecalciferol) .... 2 tabs once daily 10)  Vitamin E 600 Unit Caps (Vitamin e) .Marland Kitchen.. 1 by mouth once daily 11)  Vitamin B Complex-c Caps (B complex-c) .Marland Kitchen.. 1 by mouth once daily 12)  Vitamin C 500 Mg Tabs (Ascorbic acid) .... 2 by mouth once daily 13)  Biotin 1000 Mcg Tabs (Biotin) .Marland Kitchen.. 1 by mouth at bedtime 14)  Humalog 100 Unit/ml Soln (Insulin lispro (human)) .... Via insulin pump as directed 15)  Proventil Hfa 108 (90 Base) Mcg/act Aers (Albuterol sulfate) .... 2 puffs every 4 hours as needed sob 16)  Metformin Hcl 1000 Mg Tabs (Metformin hcl) .... Two times a day 17)  Promethazine Hcl 25 Mg Tabs (Promethazine hcl) .Marland Kitchen.. 1 q 4 hours as needed nausea 18)  Topiramate 50 Mg Tabs (Topiramate) .... At bedtime 19)  Loestrin 24 Fe 1-20 Mg-mcg Tabs (Norethin ace-eth estrad-fe) .... As directed 20)  Cetirizine Hcl 10 Mg Tabs (Cetirizine hcl) .... Every morning 21)  Alpha-lipoic Acid 600 Mg Caps (Alpha-lipoic acid) .... Every morning 22)  Trazodone Hcl 100 Mg Tabs (Trazodone hcl) .... 2 at bedtime 23)  Levsin/sl 0.125 Mg Subl (Hyoscyamine sulfate) .Marland Kitchen.. 1-2 tablets by mouth four times a day as needed 24)  Colace 100 Mg Caps (Docusate sodium) .Marland Kitchen.. 1 once daily 25)  Aspir-low 81 Mg Tbec (Aspirin) .Marland Kitchen.. 1 once daily 26)  Hydroxyzine Hcl 50 Mg Tabs (Hydroxyzine hcl) .Marland Kitchen.. 1 q6h as needed 27)  Risperdal 2 Mg Tabs (Risperidone) .Marland Kitchen.. 1 at bedtime 28)  Prazosin Hcl 2 Mg Caps (Prazosin hcl) .... 3 at bedtime 29)  Budeprion Xl 300 Mg Xr24h-tab (Bupropion hcl) .Marland Kitchen.. 1 once daily 30)  Metoprolol Succinate 25 Mg Xr24h-tab (Metoprolol succinate) .... Once daily 31)  Omeprazole 20 Mg Cpdr  (Omeprazole) .Marland Kitchen.. 1 two times a day 32)  Abilify 2 Mg Tabs (Aripiprazole) .... Qd 33)  Symbicort 160-4.5 Mcg/act Aero (Budesonide-formoterol fumarate) .... 2 puffs two times a day 34)  Klor-con M20 20 Meq Cr-tabs (Potassium chloride crys cr) .... Once daily  Patient Instructions: 1)  I refilled Proventil for rescue. Will switch from Advair to Symbicort for maintenance.  2)  Please schedule a follow-up appointment as needed .  Prescriptions: KLOR-CON M20 20 MEQ CR-TABS (POTASSIUM CHLORIDE CRYS CR) once daily  #30 x 11   Entered and Authorized by:   Nelwyn Salisbury MD   Signed by:   Nelwyn Salisbury MD on 08/09/2010   Method used:   [facility] to        [facility] [phone]* (retail)       [address]       [name], [place]  [address]       Ph: [phone]       Fax: [phone]   RxID:   [id number] PROVENTIL HFA 108 (90 BASE) MCG/ACT  AERS (ALBUTEROL SULFATE) 2 puffs every 4 hours as needed sob  #1 x  11   Entered and Authorized by:   Nelwyn Salisbury MD   Signed by:   Nelwyn Salisbury MD on 08/09/2010   Method used:   Electronically to        [facility] [phone]* (retail)       [address]       [place], [place]  [address]       Ph: [phone]       Fax: [phone]   RxID:   [id number] SYMBICORT 160-4.5 MCG/ACT AERO (BUDESONIDE-FORMOTEROL FUMARATE) 2 puffs two times a day  #30 x 11   Entered and Authorized by:   Nelwyn Salisbury MD   Signed by:   Nelwyn Salisbury MD on 08/09/2010   Method used:   Electronically to        [facility] [phone]* (retail)       [address]       [place], [place]  [address]       Ph: [phone]       Fax: [phone]   RxID:   [id number]

## 2010-11-29 NOTE — Assessment & Plan Note (Signed)
Summary: HYPERGLYCEMIA // RS   Vital Signs:  Patient profile:   35 year old female Weight:      275 pounds Temp:     98.9 degrees F oral Pulse rate:   118 / minute BP sitting:   112 / 90  (left arm) Cuff size:   large  Vitals Entered By: Alfred Levins, CMA (December 27, 2009 1:20 PM) CC: acid reflux, hyperglycemic bs was 172 this am   History of Present Illness: Here for 4 days of epigastric pain, worsening heartburn, and nausea. She threw up several times last weekend but not for the past 2 days. BMs are regular. No fever. Still on Aciphex two times a day , and she suppplements this with Gaviscon several times a day.   Current Medications (verified): 1)  Aciphex 20 Mg Tbec (Rabeprazole Sodium) .... Two Times A Day 2)  Crestor 40 Mg Tabs (Rosuvastatin Calcium) .Marland Kitchen.. 1 Tablet By Mouth Daily 3)  Synthroid 125 Mcg  Tabs (Levothyroxine Sodium) .Marland Kitchen.. 1 By Mouth Once Daily 4)  Lovaza 1 Gm  Caps (Omega-3-Acid Ethyl Esters) .... 2 Caps Two Times A Day 5)  Januvia 100 Mg Tabs (Sitagliptin Phosphate) .... Take 1 Tab By Mouth Daily 6)  Actos 45 Mg Tabs (Pioglitazone Hcl) .Marland Kitchen.. 1 By Mouth Daily 7)  Sertraline Hcl 100 Mg Tabs (Sertraline Hcl) .... 2 By Mouth Daily 8)  Furosemide 20 Mg Tabs (Furosemide) .... Take 1 Tab By Mouth Every Day 9)  Budeprion Sr 150 Mg Tb12 (Bupropion Hcl) .Marland Kitchen.. 1 By Mouth Once Daily 10)  Risperdal 0.5 Mg  Tabs (Risperidone) .... 1/2-1 Up To 4 Times Day 11)  Vitamin D 1000 Unit  Tabs (Cholecalciferol) .... 2 Tabs Once Daily 12)  Vitamin E 600 Unit  Caps (Vitamin E) .Marland Kitchen.. 1 By Mouth Once Daily 13)  Vitamin B Complex-C   Caps (B Complex-C) .Marland Kitchen.. 1 By Mouth Once Daily 14)  Vitamin C 500 Mg  Tabs (Ascorbic Acid) .... 2 By Mouth Once Daily 15)  Biotin 1000 Mcg  Tabs (Biotin) .Marland Kitchen.. 1 By Mouth At Bedtime 16)  Amrix 15 Mg  Cp24 (Cyclobenzaprine Hcl) .Marland Kitchen.. 1 By Mouth Once Daily 17)  Humalog Mix 75/25 Kwikpen 75-25 %  Susp (Insulin Lispro Prot & Lispro) .... 20-25 Units Before Meals 18)   Advair Diskus 250-50 Mcg/dose  Misc (Fluticasone-Salmeterol) .... One Puff Two Times A Day 19)  Proventil Hfa 108 (90 Base) Mcg/act  Aers (Albuterol Sulfate) .... 2 Puffs Every 4 Hours As Needed Sob 20)  Metformin Hcl 1000 Mg  Tabs (Metformin Hcl) .... Two Times A Day  Allergies (verified): 1)  ! Lortab 2)  ! Penicillin 3)  ! Ultram 4)  ! * Byetta  Past History:  Past Medical History: Reviewed history from 11/24/2008 and no changes required. Asthma Diabetes mellitus, type II, sees Dr. Leslie Dales GERD Headache Hyperlipidemia Hypertension Hypothyroidism Depression, sees Dr. Clayborn Heron in [place]. Also sees a therapist regularly. Chickenpox Eating disorder, hx of anorexia sees Julio Sicks for GYN exams carpal tunnel, bilateral proven by EMG/NCV , sees Dr. Vickey Huger torn medial meniscus right knee 2003  Past Surgical History: Reviewed history from 03/17/2008 and no changes required. Blocked intestinal repair at 69 months of age Left inguinal  hernia repair at age 61 years Laparoscopy to rule out endometriosis 1998  Review of Systems  The patient denies anorexia, fever, weight loss, weight gain, vision loss, decreased hearing, hoarseness, chest pain, syncope, dyspnea on exertion, peripheral edema, prolonged cough, headaches, hemoptysis,  melena, hematochezia, hematuria, incontinence, genital sores, muscle weakness, suspicious skin lesions, transient blindness, difficulty walking, depression, unusual weight change, abnormal bleeding, enlarged lymph nodes, angioedema, breast masses, and testicular masses.    Physical Exam  General:  Well-developed,well-nourished,in no acute distress; alert,appropriate and cooperative throughout examination Neck:  No deformities, masses, or tenderness noted. Lungs:  Normal respiratory effort, chest expands symmetrically. Lungs are clear to auscultation, no crackles or wheezes. Heart:  Normal rate and regular rhythm. S1 and S2 normal without  gallop, murmur, click, rub or other extra sounds. Abdomen:  soft, normal bowel sounds, no distention, no masses, no guarding, no rigidity, no rebound tenderness, no abdominal hernia, no hepatomegaly, and no splenomegaly.  moderately tender in the epigastrium and RUQ   Impression & Recommendations:  Problem # 1:  ABDOMINAL PAIN, RIGHT UPPER QUADRANT (ICD-789.01)  Orders: Radiology Referral (Radiology)  Problem # 2:  GERD (ICD-530.81)  Her updated medication list for this problem includes:    Aciphex 20 Mg Tbec (Rabeprazole sodium) .Marland Kitchen..Marland Kitchen Two times a day  Complete Medication List: 1)  Aciphex 20 Mg Tbec (Rabeprazole sodium) .... Two times a day 2)  Crestor 40 Mg Tabs (Rosuvastatin calcium) .Marland Kitchen.. 1 tablet by mouth daily 3)  Synthroid 125 Mcg Tabs (Levothyroxine sodium) .Marland Kitchen.. 1 by mouth once daily 4)  Lovaza 1 Gm Caps (Omega-3-acid ethyl esters) .... 2 caps two times a day 5)  Januvia 100 Mg Tabs (Sitagliptin phosphate) .... Take 1 tab by mouth daily 6)  Actos 45 Mg Tabs (Pioglitazone hcl) .Marland Kitchen.. 1 by mouth daily 7)  Sertraline Hcl 100 Mg Tabs (Sertraline hcl) .... 2 by mouth daily 8)  Furosemide 20 Mg Tabs (Furosemide) .... Take 1 tab by mouth every day 9)  Budeprion Sr 150 Mg Tb12 (Bupropion hcl) .Marland Kitchen.. 1 by mouth once daily 10)  Risperdal 0.5 Mg Tabs (Risperidone) .... 1/2-1 up to 4 times day 11)  Vitamin D 1000 Unit Tabs (Cholecalciferol) .... 2 tabs once daily 12)  Vitamin E 600 Unit Caps (Vitamin e) .Marland Kitchen.. 1 by mouth once daily 13)  Vitamin B Complex-c Caps (B complex-c) .Marland Kitchen.. 1 by mouth once daily 14)  Vitamin C 500 Mg Tabs (Ascorbic acid) .... 2 by mouth once daily 15)  Biotin 1000 Mcg Tabs (Biotin) .Marland Kitchen.. 1 by mouth at bedtime 16)  Amrix 15 Mg Cp24 (Cyclobenzaprine hcl) .Marland Kitchen.. 1 by mouth once daily 17)  Humalog Mix 75/25 Kwikpen 75-25 % Susp (Insulin lispro prot & lispro) .... 20-25 units before meals 18)  Advair Diskus 250-50 Mcg/dose Misc (Fluticasone-salmeterol) .... One puff two times a  day 19)  Proventil Hfa 108 (90 Base) Mcg/act Aers (Albuterol sulfate) .... 2 puffs every 4 hours as needed sob 20)  Metformin Hcl 1000 Mg Tabs (Metformin hcl) .... Two times a day 21)  Promethazine Hcl 25 Mg Tabs (Promethazine hcl) .Marland Kitchen.. 1 q 4 hours as needed nausea  Patient Instructions: 1)  This is suggestive of gallbladder disease. Eat low fat meals, and we will set up an abdominal US soon. Prescriptions: PROMETHAZINE HCL 25 MG TABS (PROMETHAZINE HCL) 1 q 4 hours as needed nausea  #30 x 2   Entered and Authorized by:   Nelwyn Salisbury MD   Signed by:   Nelwyn Salisbury MD on 12/27/2009   Method used:   [facility]        [facility] [phone]       [address]       [place]  [place], [place]  [address]       Ph: [phone]       Fax: [phone]   RxID:   [id number]

## 2010-11-29 NOTE — Letter (Signed)
Summary: [facility]   Imported By: Sherian Rein 05/26/2010 10:35:01  _____________________________________________________________________  External Attachment:    Type:   Image     Comment:   External Document

## 2010-11-29 NOTE — Letter (Signed)
Summary: DMV FORM  DMV FORM   Imported By: Georgian Co 06/09/2010 11:57:23  _____________________________________________________________________  External Attachment:    Type:   Image     Comment:   External Document

## 2010-11-29 NOTE — Progress Notes (Signed)
Summary: Repeat labs  Phone Note Outgoing Call Call back at [facility] Phone [phone]   Call placed by: Christie Nottingham CMA Duncan Dull),  Mar 01, 2010 10:59 AM Call placed to: Patient Summary of Call: Called and left a message for pt to call me back regarding her repeat LFT's for hepatomegaly and fatty liver disease. Initial call taken by: Christie Nottingham CMA Duncan Dull),  Mar 01, 2010 11:00 AM  Follow-up for Phone Call        left message for pt  to call back. Will send pt a letter.  Follow-up by: Christie Nottingham CMA Duncan Dull),  Mar 02, 2010 3:12 PM

## 2010-11-29 NOTE — Letter (Signed)
Summary: Appointment Reminder  [facility]  [address] [place], [place] [address]   Phone: [phone]  Fax: [phone]        Mar 02, 2010 MRN: [id number]    [facility] [address] [place], [place]  [address]    Dear Ms. SMEJKAL,   We have been unable to reach you by phone to schedule a follow up lab   appointment that was recommended for you by Dr. Russella Dar. It is very   important that we reach you to schedule an lab appointment due to your  fatty liver disease. We hope that you allow [place] to participate in your  health care needs. Please contact us [phone] at your earliest   convenience to schedule your appointment.      Sincerely,    Christie Nottingham CMA (AAMA)

## 2010-11-29 NOTE — Progress Notes (Signed)
Summary: aciphex not covered by ins pt req to switch to omeprazole  Phone Note Call from Patient Call back at Home Phone [phone]   Caller: Patient Call For: Nelwyn Salisbury MD Summary of Call: pt stated ins will not pay for aciphex [facility] [phone]  for  new rx omeprazole 20mg  twice a day  Initial call taken by: Heron Sabins,  June 17, 2010 4:10 PM  Follow-up for Phone Call        call this in please, #60 with 11 rf Follow-up by: Nelwyn Salisbury MD,  June 17, 2010 4:19 PM  Additional Follow-up for Phone Call Additional follow up Details #1::        Rx Called In Additional Follow-up by: Raechel Ache, RN,  June 17, 2010 4:27 PM    New/Updated Medications: OMEPRAZOLE 20 MG CPDR (OMEPRAZOLE) 1 two times a day Prescriptions: OMEPRAZOLE 20 MG CPDR (OMEPRAZOLE) 1 two times a day  #60 x 11   Entered by:   Raechel Ache, RN   Authorized by:   Nelwyn Salisbury MD   Signed by:   Raechel Ache, RN on 06/17/2010   Method used:   [facility]        [facility] [phone][facility] ([facility]ail[facility]       [address]       [place], [place]  [address]       Ph: [phone]       Fax: [phone]   RxID:   [id number]

## 2010-11-29 NOTE — Progress Notes (Signed)
Summary: speak to nurse  Phone Note Call from Patient Call back at Work Phone [phone]   Caller: Patient Call For: Russella Dar Reason for Call: Talk to Nurse Summary of Call: Patient is returning Nell's call from 5-3 about labs she has to have done. Patient states that she just got that message because she was hospitalized in [place]. Initial call taken by: Tawni Levy,  April 11, 2010 8:46 AM  Follow-up for Phone Call        patient will come for repeat lab work tomorrow Follow-up by: Darcey Nora RN, CGRN,  April 11, 2010 9:07 AM

## 2010-11-29 NOTE — Letter (Signed)
Summary: [facility]  [facility]   Imported By: Maryln Gottron 05/18/2010 13:19:15  _____________________________________________________________________  External Attachment:    Type:   Image     Comment:   External Document

## 2010-11-29 NOTE — Progress Notes (Signed)
Summary: advice  Phone Note Call from Patient Call back at Home Phone [phone]   Caller: Patient Call For: fry Summary of Call: pt wants to know what she should do now that her u/s was negative for gallstones.  Told pt it could be GERD related but I would consult with Dr Clent Ridges and call her back Initial call taken by: Alfred Levins, CMA,  December 28, 2009 4:38 PM  Follow-up for Phone Call        since she is already on acid blockers I am not sure what the next step may be. refer her to GI for abdominal pains Follow-up by: Nelwyn Salisbury MD,  December 28, 2009 5:07 PM  Additional Follow-up for Phone Call Additional follow up Details #1::        Phone Call Completed Additional Follow-up by: Alfred Levins, CMA,  December 28, 2009 5:21 PM

## 2010-11-29 NOTE — Assessment & Plan Note (Signed)
Summary: ABD PAIN/YF   History of Present Illness Visit Type: Initial Consult Primary GI MD: Elie Goody MD [facility] Primary Provider: Gershon Crane, MD Requesting Provider: Gershon Crane, MD Chief Complaint: Abdominal pain off/on x 23month; vomiting & diarrhea History of Present Illness:   This is a 35 year old female, who relates a three-day history of epigastric pain, nausea, and vomiting with worsening reflux symptoms that occurred approximately one month ago, and all the symptoms completely resolved. Last week, she had 3-4 days of nausea, vomiting, and diarrhea, and the symptoms have completely resolved. Abdominal ultrasound:  IMPRESSION: No gallstones or biliary obstruction.  Hepatomegaly with echogenic liver consistent with fatty infiltration.   GI Review of Systems    Reports abdominal pain, acid reflux, bloating, loss of appetite, nausea, and  vomiting.     Location of  Abdominal pain: epigastric area.    Denies belching, chest pain, dysphagia with liquids, dysphagia with solids, heartburn, vomiting blood, weight loss, and  weight gain.      Reports change in bowel habits and  diarrhea.     Denies anal fissure, black tarry stools, constipation, diverticulosis, fecal incontinence, heme positive stool, hemorrhoids, irritable bowel syndrome, jaundice, light color stool, liver problems, rectal bleeding, and  rectal pain.   Current Medications (verified): 1)  Aciphex 20 Mg Tbec (Rabeprazole Sodium) .... Two Times A Day 2)  Crestor 40 Mg Tabs (Rosuvastatin Calcium) .Marland Kitchen.. 1 Tablet By Mouth Daily 3)  Synthroid 125 Mcg  Tabs (Levothyroxine Sodium) .Marland Kitchen.. 1 By Mouth Once Daily 4)  Lovaza 1 Gm  Caps (Omega-3-Acid Ethyl Esters) .... 2 Caps Two Times A Day 5)  Januvia 100 Mg Tabs (Sitagliptin Phosphate) .... Take 1 Tab By Mouth Daily 6)  Actos 45 Mg Tabs (Pioglitazone Hcl) .Marland Kitchen.. 1 By Mouth Daily 7)  Sertraline Hcl 100 Mg Tabs (Sertraline Hcl) .... 2 By Mouth Daily 8)  Furosemide 20 Mg Tabs  (Furosemide) .... Take 1 Tab By Mouth Every Day 9)  Budeprion Sr 150 Mg Tb12 (Bupropion Hcl) .Marland Kitchen.. 1 By Mouth Once Daily 10)  Risperdal 0.5 Mg  Tabs (Risperidone) .... 1/2-1 Up To 4 Times Day 11)  Vitamin D 1000 Unit  Tabs (Cholecalciferol) .... 2 Tabs Once Daily 12)  Vitamin E 600 Unit  Caps (Vitamin E) .Marland Kitchen.. 1 By Mouth Once Daily 13)  Vitamin B Complex-C   Caps (B Complex-C) .Marland Kitchen.. 1 By Mouth Once Daily 14)  Vitamin C 500 Mg  Tabs (Ascorbic Acid) .... 2 By Mouth Once Daily 15)  Biotin 1000 Mcg  Tabs (Biotin) .Marland Kitchen.. 1 By Mouth At Bedtime 16)  Humalog 100 Unit/ml Soln (Insulin Lispro (Human)) .... Via Insulin Pump As Directed 17)  Advair Diskus 250-50 Mcg/dose  Misc (Fluticasone-Salmeterol) .... One Puff Two Times A Day 18)  Proventil Hfa 108 (90 Base) Mcg/act  Aers (Albuterol Sulfate) .... 2 Puffs Every 4 Hours As Needed Sob 19)  Metformin Hcl 1000 Mg  Tabs (Metformin Hcl) .... Two Times A Day 20)  Promethazine Hcl 25 Mg Tabs (Promethazine Hcl) .Marland Kitchen.. 1 Q 4 Hours As Needed Nausea 21)  Topiramate 50 Mg Tabs (Topiramate) .... At Bedtime 22)  Loestrin 24 Fe 1-20 Mg-Mcg Tabs (Norethin Ace-Eth Estrad-Fe) .... As Directed 23)  Cetirizine Hcl 10 Mg Tabs (Cetirizine Hcl) .... Every Morning 24)  Alpha-Lipoic Acid 600 Mg Caps (Alpha-Lipoic Acid) .... Every Morning 25)  Trazodone Hcl 100 Mg Tabs (Trazodone Hcl) .... At Bedtime  Allergies (verified): 1)  ! Lortab 2)  ! Penicillin 3)  !  Ultram 4)  ! * Byetta  Past History:  Past Medical History: Reviewed history from 11/24/2008 and no changes required. Asthma Diabetes mellitus, type II, sees Dr. Leslie Dales GERD Headache Hyperlipidemia Hypertension Hypothyroidism Depression, sees Dr. Clayborn Heron in [place]. Also sees a therapist regularly. Chickenpox Eating disorder, hx of anorexia sees Julio Sicks for GYN exams carpal tunnel, bilateral proven by EMG/NCV , sees Dr. Vickey Huger torn medial meniscus right knee 2003  Past Surgical  History: Reviewed history from 03/17/2008 and no changes required. Blocked intestinal repair at 17 months of age Left inguinal  hernia repair at age 70 years Laparoscopy to rule out endometriosis 1998  Family History: Reviewed history from 03/17/2008 and no changes required. Adopted Family History of CAD Female 1st degree relative <50 Family History Diabetes 1st degree relative  Social History: Reviewed history from 03/17/2008 and no changes required. Married Current Smoker less than ppd Alcohol use-no Drug use-no Regular exercise-yes Occupation: psychotherapist  Review of Systems       The patient complains of allergy/sinus, anxiety-new, depression-new, fatigue, headaches-new, sleeping problems, and urine leakage.  The patient denies anemia, arthritis/joint pain, back pain, blood in urine, breast changes/lumps, change in vision, confusion, cough, coughing up blood, fainting, fever, hearing problems, heart murmur, heart rhythm changes, itching, menstrual pain, muscle pains/cramps, night sweats, nosebleeds, pregnancy symptoms, shortness of breath, skin rash, sore throat, swelling of feet/legs, swollen lymph glands, thirst - excessive , urination - excessive , urination changes/pain, vision changes, and voice change.    Vital Signs:  Patient profile:   35 year old female Height:      61 inches Weight:      270.38 pounds BMI:     51.27 Pulse rate:   72 / minute Pulse rhythm:   regular BP sitting:   124 / 90  (right arm) Cuff size:   large  Vitals Entered By: June McMurray CMA Duncan Dull) (January 26, 2010 2:40 PM)  Physical Exam  General:  Well developed, well nourished, no acute distress. obese.   Head:  Normocephalic and atraumatic. Eyes:  PERRLA, no icterus. Ears:  Normal auditory acuity. Mouth:  No deformity or lesions, dentition normal. Neck:  Supple; no masses or thyromegaly. Lungs:  Clear throughout to auscultation. Heart:  Regular rate and rhythm; no murmurs, rubs,  or  bruits. Abdomen:  Soft, nontender and nondistended. No masses, hepatosplenomegaly or hernias noted. Normal bowel sounds. Msk:  Symmetrical with no gross deformities. Normal posture. Pulses:  Normal pulses noted. Extremities:  No clubbing, cyanosis, edema or deformities noted. Neurologic:  Alert and  oriented x4;  grossly normal neurologically. Cervical Nodes:  No significant cervical adenopathy. Inguinal Nodes:  No significant inguinal adenopathy. Psych:  Alert and cooperative. Normal mood and affect.  Impression & Recommendations:  Problem # 1:  ABDOMINAL PAIN, EPIGASTRIC (ICD-789.06) Rule out acalculous cholecystitis, GERD flare, irritable bowel syndrome and gastroparesis. If the CCK HIDA is unremarkable consider proceeding to upper endoscopy and GES for further evaluation. Orders: TLB-CBC Platelet - w/Differential (85025-CBCD) TLB-CMP (Comprehensive Metabolic Pnl) (80053-COMP) TLB-TSH (Thyroid Stimulating Hormone) (84443-TSH) TLB-Lipase (83690-LIPASE) HIDA CCK (HIDA CCK)  Problem # 2:  GERD (ICD-530.81) Continue AcipHex b.i.d., and standard antireflux measures.  Problem # 3:  FATTY LIVER DISEASE (ICD-571.8) Long-term weight-loss program and optimal diabetes management per primary physician and endocrinologist.  Patient Instructions: 1)  Get your labs drawn today in the basement.  2)  You have been scheduled for a CCK HIDA Scan.  3)  Follow instructions on Hemoccult cards and mail  back to [place] when finished.  4)  Pick up your prescription from your pharmacy.  5)  Please schedule a follow-up appointment in 6 weeks.  6)  Copy sent to : Gershon Crane, MD 7)  The medication list was reviewed and reconciled.  All changed / newly prescribed medications were explained.  A complete medication list was provided to the patient / caregiver.  Prescriptions: LEVSIN/SL 0.125 MG SUBL (HYOSCYAMINE SULFATE) 1-2 tablets by mouth four times a day as needed  #60 x 5   Entered by:   Christie Nottingham  CMA (AAMA)   Authorized by:   Meryl Dare MD [facility]   Signed by:   Christie Nottingham CMA (AAMA) on 01/26/2010   Method used:   [facility]        [facility] [phone][facility] (retail)       [address]       [place], [place]  [address]       Ph: [phone]       Fax: [phone]   RxID:   [id number]

## 2010-11-29 NOTE — Assessment & Plan Note (Signed)
Summary: Azia Toutant PT/NEW MEDS LIST/FU METOPROLOL----PT [facility] // RS   Vital Signs:  Patient profile:   35 year old female Weight:      250 pounds BMI:     47.41 Pulse rate:   84 / minute Pulse rhythm:   regular BP sitting:   108 / 76  (left arm) Cuff size:   large  Vitals Entered By: Raechel Ache, RN (April 19, 2010 3:26 PM) CC: Hosp f/u- was in [place] x 6 weeks for mental health. Is Patient Diabetic? Yes   History of Present Illness: Here to follow up after a voluntary hospital stay at [facility] in [place], [place] from 02-24-10 to 04-09-10 for recurrent severe major depression, eating disorder NOS, post traumatic stress disorder, dissociative identity disorder, and self mutilation behaviors. This was suggested by Dr. Derrek Gu, her  psychiatrist in [place]. She thinks this was helpful for her, and she feels more in control of herself now. She has had only  one instance of self harm since her DC, as opposed to multiple instances every day. She has seen her psychiatrist and her therapist once each since she got back home. She asks that we refer her to her neurologist, Dr. Vickey Huger, for her migraines. She had been working with Dr. Russella Dar prior to this hospitalization for periodic gall bladder dysfunction, and she plans to have a cholecystectomy per Dr. Dwain Sarna when things settle down.   Allergies: 1)  ! Lortab 2)  ! Penicillin 3)  ! Ultram 4)  ! * Byetta  Past History:  Past Medical History: Asthma Diabetes mellitus, type II, sees Dr. Leslie Dales (on insulin pump) GERD Headache Hyperlipidemia Hypertension Hypothyroidism Depression, sees Dr. Clayborn Heron in [place]. Also sees a therapist regularly (Dr. Evalina Field) Chickenpox Eating disorder, hx of anorexia sees Julio Sicks for GYN exams carpal tunnel, bilateral proven by EMG/NCV , sees Dr. Vickey Huger torn medial meniscus right knee 2003  Past Surgical History: Reviewed history from 03/17/2008 and no changes  required. Blocked intestinal repair at 61 months of age Left inguinal  hernia repair at age 50 years Laparoscopy to rule out endometriosis 1998  Review of Systems  The patient denies anorexia, fever, weight loss, weight gain, vision loss, decreased hearing, hoarseness, chest pain, syncope, dyspnea on exertion, peripheral edema, prolonged cough, hemoptysis, abdominal pain, melena, hematochezia, severe indigestion/heartburn, hematuria, incontinence, genital sores, muscle weakness, suspicious skin lesions, transient blindness, difficulty walking, unusual weight change, abnormal bleeding, enlarged lymph nodes, angioedema, breast masses, and testicular masses.    Physical Exam  General:  overweight-appearing.   Neck:  No deformities, masses, or tenderness noted. Lungs:  Normal respiratory effort, chest expands symmetrically. Lungs are clear to auscultation, no crackles or wheezes. Heart:  Normal rate and regular rhythm. S1 and S2 normal without gallop, murmur, click, rub or other extra sounds. Neurologic:  alert & oriented X3 and gait normal.   Psych:  Cognition and judgment appear intact. Alert and cooperative with normal attention span and concentration. No apparent delusions, illusions, hallucinations   Impression & Recommendations:  Problem # 1:  MIGRAINE HEADACHE (ICD-346.90)  Her updated medication list for this problem includes:    Aspir-low 81 Mg Tbec (Aspirin) .Marland Kitchen... 1 once daily    Metoprolol Succinate 25 Mg Xr24h-tab (Metoprolol succinate) ..... Once daily  Orders: Neurology Referral (Neuro)  Problem # 2:  DEPRESSION (ICD-311)  The following medications were removed from the medication list:    Budeprion Sr 150 Mg Tb12 (Bupropion hcl) .Marland Kitchen... 1 by mouth  once daily Her updated medication list for this problem includes:    Sertraline Hcl 100 Mg Tabs (Sertraline hcl) .Marland Kitchen... 2 by mouth daily    Trazodone Hcl 100 Mg Tabs (Trazodone hcl) .Marland Kitchen... 2 at bedtime    Hydroxyzine Hcl 50 Mg Tabs  (Hydroxyzine hcl) .Marland Kitchen... 1 q6h as needed    Budeprion Xl 300 Mg Xr24h-tab (Bupropion hcl) .Marland Kitchen... 1 once daily  Problem # 3:  HYPOTHYROIDISM (ICD-244.9)  Her updated medication list for this problem includes:    Synthroid 125 Mcg Tabs (Levothyroxine sodium) .Marland Kitchen... 1 by mouth once daily  Problem # 4:  HYPERTENSION (ICD-401.9)  Her updated medication list for this problem includes:    Furosemide 20 Mg Tabs (Furosemide) ..... Once daily    Prazosin Hcl 2 Mg Caps (Prazosin hcl) .Marland KitchenMarland KitchenMarland KitchenMarland Kitchen 3 at bedtime    Metoprolol Succinate 25 Mg Xr24h-tab (Metoprolol succinate) ..... Once daily  Problem # 5:  DIABETES MELLITUS, TYPE II (ICD-250.00)  Her updated medication list for this problem includes:    Januvia 100 Mg Tabs (Sitagliptin phosphate) .Marland Kitchen... Take 1 tab by mouth daily    Actos 45 Mg Tabs (Pioglitazone hcl) .Marland Kitchen... 1 by mouth daily    Humalog 100 Unit/ml Soln (Insulin lispro (human)) .Marland Kitchen... Via insulin pump as directed    Metformin Hcl 1000 Mg Tabs (Metformin hcl) .Marland Kitchen..Marland Kitchen Two times a day    Aspir-low 81 Mg Tbec (Aspirin) .Marland Kitchen... 1 once daily  Complete Medication List: 1)  Aciphex 20 Mg Tbec (Rabeprazole sodium) .... Two times a day 2)  Crestor 40 Mg Tabs (Rosuvastatin calcium) .Marland Kitchen.. 1 tablet by mouth daily 3)  Synthroid 125 Mcg Tabs (Levothyroxine sodium) .Marland Kitchen.. 1 by mouth once daily 4)  Lovaza 1 Gm Caps (Omega-3-acid ethyl esters) .... 2 caps two times a day 5)  Januvia 100 Mg Tabs (Sitagliptin phosphate) .... Take 1 tab by mouth daily 6)  Actos 45 Mg Tabs (Pioglitazone hcl) .Marland Kitchen.. 1 by mouth daily 7)  Sertraline Hcl 100 Mg Tabs (Sertraline hcl) .... 2 by mouth daily 8)  Furosemide 20 Mg Tabs (Furosemide) .... Once daily 9)  Risperdal 0.5 Mg Tabs (Risperidone) .... One in am 10)  Vitamin D 1000 Unit Tabs (Cholecalciferol) .... 2 tabs once daily 11)  Vitamin E 600 Unit Caps (Vitamin e) .Marland Kitchen.. 1 by mouth once daily 12)  Vitamin B Complex-c Caps (B complex-c) .Marland Kitchen.. 1 by mouth once daily 13)  Vitamin C 500 Mg  Tabs (Ascorbic acid) .... 2 by mouth once daily 14)  Biotin 1000 Mcg Tabs (Biotin) .Marland Kitchen.. 1 by mouth at bedtime 15)  Humalog 100 Unit/ml Soln (Insulin lispro (human)) .... Via insulin pump as directed 16)  Advair Diskus 250-50 Mcg/dose Misc (Fluticasone-salmeterol) .... One puff two times a day 17)  Proventil Hfa 108 (90 Base) Mcg/act Aers (Albuterol sulfate) .... 2 puffs every 4 hours as needed sob 18)  Metformin Hcl 1000 Mg Tabs (Metformin hcl) .... Two times a day 19)  Promethazine Hcl 25 Mg Tabs (Promethazine hcl) .Marland Kitchen.. 1 q 4 hours as needed nausea 20)  Topiramate 50 Mg Tabs (Topiramate) .... At bedtime 21)  Loestrin 24 Fe 1-20 Mg-mcg Tabs (Norethin ace-eth estrad-fe) .... As directed 22)  Cetirizine Hcl 10 Mg Tabs (Cetirizine hcl) .... Every morning 23)  Alpha-lipoic Acid 600 Mg Caps (Alpha-lipoic acid) .... Every morning 24)  Trazodone Hcl 100 Mg Tabs (Trazodone hcl) .... 2 at bedtime 25)  Levsin/sl 0.125 Mg Subl (Hyoscyamine sulfate) .Marland Kitchen.. 1-2 tablets by mouth four times a day  as needed 26)  Colace 100 Mg Caps (Docusate sodium) .Marland Kitchen.. 1 once daily 27)  Aspir-low 81 Mg Tbec (Aspirin) .Marland Kitchen.. 1 once daily 28)  Hydroxyzine Hcl 50 Mg Tabs (Hydroxyzine hcl) .Marland Kitchen.. 1 q6h as needed 29)  Klor-con 20 Meq Pack (Potassium chloride) .... Once daily 30)  Risperdal 2 Mg Tabs (Risperidone) .Marland Kitchen.. 1 at bedtime 31)  Prazosin Hcl 2 Mg Caps (Prazosin hcl) .... 3 at bedtime 32)  Budeprion Xl 300 Mg Xr24h-tab (Bupropion hcl) .Marland Kitchen.. 1 once daily 33)  Metoprolol Succinate 25 Mg Xr24h-tab (Metoprolol succinate) .... Once daily  Patient Instructions: 1)  We will refer her to Dr. Vickey Huger for migraines. Follow up with her psychiatrist and therapist as scheduled. See Dr. Dwain Sarna as scheduled.  2)  Please schedule a follow-up appointment in 3 months .  Prescriptions: FUROSEMIDE 20 MG TABS (FUROSEMIDE) once daily  #30 x 11   Entered and Authorized by:   Nelwyn Salisbury MD   Signed by:   Nelwyn Salisbury MD on 04/19/2010    Method used:   [facility]        [facility] [phone]* (retail)       [address]       [place], [place]  [address]       Ph: [phone]       Fax: [phone]   RxID:   [id number] ACIPHEX 20 MG TBEC (RABEPRAZOLE SODIUM) two times a day  #60 x 11   Entered and Authorized by:   Nelwyn Salisbury MD   Signed by:   Nelwyn Salisbury MD on 04/19/2010   Method used:   [facility] to        [facility] [phone]* (retail)       [address]       [place], [place]  [address]       Ph: [phone]       Fax: [phone]   RxID:   [id number] METOPROLOL SUCCINATE 25 MG XR24H-TAB (METOPROLOL SUCCINATE) once daily  #30 x 11   Entered and Authorized by:   Nelwyn Salisbury MD   Signed by:   Nelwyn Salisbury MD on 04/19/2010   Method used:   [facility]ron[facility] to        [facility] [phone]* (retail)       [address]       [place], [place]  [address]       Ph: [phone]       Fax: [phone]   RxID:   [id number]

## 2010-11-29 NOTE — Letter (Signed)
Summary: [facility]  [facility]   Imported By: Maryln Gottron 02/02/2010 11:27:34  _____________________________________________________________________  External Attachment:    Type:   Image     Comment:   External Document

## 2010-11-29 NOTE — Letter (Signed)
Summary: [facility]   Imported By: Maryln Gottron 06/13/2010 15:37:01  _____________________________________________________________________  External Attachment:    Type:   Image     Comment:   External Document

## 2010-11-29 NOTE — Letter (Signed)
Summary: Records from Dr. Rosalene Billings [facility]  Records from Dr. Rosalene Billings [facility]   Imported By: Maryln Gottron 04/21/2010 13:27:30  _____________________________________________________________________  External Attachment:    Type:   Image     Comment:   External Document

## 2010-11-29 NOTE — Letter (Signed)
Summary: [facility]-Dr. W [place] Stephens  [facility]-Dr. Lacretia Nicks [place] Stephens   Imported By: Maryln Gottron 02/24/2010 11:10:22  _____________________________________________________________________  External Attachment:    Type:   Image     Comment:   External Document

## 2010-12-01 NOTE — Assessment & Plan Note (Signed)
Summary: migraines//ccm   Vital Signs:  Patient profile:   35 year old female O2 Sat:      98 % Temp:     98.8 degrees F Pulse rate:   91 / minute BP sitting:   116 / 80  (left arm) Cuff size:   large  Vitals Entered By: Pura Spice, RN (November 21, 2010 9:53 AM) CC: headached; "migraine since saturday"    History of Present Illness: Here for 3 days of HAs which began in the back of the head but have generalized over the entire head now. About 2 weeks ago she pulled a muscle in her upper back while woking out, and she has had pain here ever since. She thinks this generated the migraine. Using heat and Aleeve. No vomitting.   Allergies: 1)  ! Lortab 2)  ! Penicillin 3)  ! Ultram 4)  ! * Byetta  Past History:  Past Medical History: Reviewed history from 04/19/2010 and no changes required. Asthma Diabetes mellitus, type II, sees Dr. Leslie Dales (on insulin pump) GERD Headache Hyperlipidemia Hypertension Hypothyroidism Depression, sees Dr. Clayborn Heron in [place]. Also sees a therapist regularly (Dr. Evalina Field) Chickenpox Eating disorder, hx of anorexia sees Julio Sicks for GYN exams carpal tunnel, bilateral proven by EMG/NCV , sees Dr. Vickey Huger torn medial meniscus right knee 2003  Past Surgical History: Reviewed history from 03/17/2008 and no changes required. Blocked intestinal repair at 7 months of age Left inguinal  hernia repair at age 68 years Laparoscopy to rule out endometriosis 1998  Review of Systems  The patient denies anorexia, fever, weight loss, weight gain, vision loss, decreased hearing, hoarseness, chest pain, syncope, dyspnea on exertion, peripheral edema, prolonged cough, hemoptysis, abdominal pain, melena, hematochezia, severe indigestion/heartburn, hematuria, incontinence, genital sores, muscle weakness, suspicious skin lesions, transient blindness, difficulty walking, depression, unusual weight change, abnormal bleeding, enlarged lymph nodes,  angioedema, breast masses, and testicular masses.    Physical Exam  General:  Well-developed,well-nourished,in no acute distress; alert,appropriate and cooperative throughout examination Head:  Normocephalic and atraumatic without obvious abnormalities. No apparent alopecia or balding. Eyes:  No corneal or conjunctival inflammation noted. EOMI. Perrla. Funduscopic exam benign, without hemorrhages, exudates or papilledema. Vision grossly normal. Neck:  tender posteriorly with some spasm of the trapezei. Full ROM  Neurologic:  No cranial nerve deficits noted. Station and gait are normal. Plantar reflexes are down-going bilaterally. DTRs are symmetrical throughout. Sensory, motor and coordinative functions appear intact.   Impression & Recommendations:  Problem # 1:  NECK PAIN (ICD-723.1)  Her updated medication list for this problem includes:    Aspir-low 81 Mg Tbec (Aspirin) .Marland Kitchen... 1 once daily    Flexeril 10 Mg Tabs (Cyclobenzaprine hcl) .Marland Kitchen... Three times a day as needed spasms  Problem # 2:  MIGRAINE HEADACHE (ICD-346.90)  Her updated medication list for this problem includes:    Aspir-low 81 Mg Tbec (Aspirin) .Marland Kitchen... 1 once daily    Metoprolol Succinate 25 Mg Xr24h-tab (Metoprolol succinate) ..... Once daily    Sumatriptan Succinate 100 Mg Tabs (Sumatriptan succinate) .Marland Kitchen... As needed for migraines  Complete Medication List: 1)  Crestor 40 Mg Tabs (Rosuvastatin calcium) .Marland Kitchen.. 1 tablet by mouth daily 2)  Synthroid 125 Mcg Tabs (Levothyroxine sodium) .Marland Kitchen.. 1 by mouth once daily 3)  Lovaza 1 Gm Caps (Omega-3-acid ethyl esters) .... 2 caps two times a day 4)  Januvia 100 Mg Tabs (Sitagliptin phosphate) .... Take 1 tab by mouth daily 5)  Actos 45 Mg Tabs (Pioglitazone  hcl) .... 1 by mouth daily 6)  Sertraline Hcl 100 Mg Tabs (Sertraline hcl) .... 2 by mouth daily 7)  Furosemide 20 Mg Tabs (Furosemide) .... Once daily 8)  Risperdal 0.5 Mg Tabs (Risperidone) .... One in am 9)  Vitamin D 1000  Unit Tabs (Cholecalciferol) .... 2 tabs once daily 10)  Vitamin E 600 Unit Caps (Vitamin e) .Marland Kitchen.. 1 by mouth once daily 11)  Vitamin B Complex-c Caps (B complex-c) .Marland Kitchen.. 1 by mouth once daily 12)  Vitamin C 500 Mg Tabs (Ascorbic acid) .... 2 by mouth once daily 13)  Biotin 1000 Mcg Tabs (Biotin) .Marland Kitchen.. 1 by mouth at bedtime 14)  Humalog 100 Unit/ml Soln (Insulin lispro (human)) .... Via insulin pump as directed 15)  Proventil Hfa 108 (90 Base) Mcg/act Aers (Albuterol sulfate) .... 2 puffs every 4 hours as needed sob 16)  Metformin Hcl 1000 Mg Tabs (Metformin hcl) .... Two times a day 17)  Promethazine Hcl 25 Mg Tabs (Promethazine hcl) .Marland Kitchen.. 1 q 4 hours as needed nausea 18)  Topiramate 50 Mg Tabs (Topiramate) .... At bedtime 19)  Loestrin 24 Fe 1-20 Mg-mcg Tabs (Norethin ace-eth estrad-fe) .... As directed 20)  Cetirizine Hcl 10 Mg Tabs (Cetirizine hcl) .... Every morning 21)  Alpha-lipoic Acid 600 Mg Caps (Alpha-lipoic acid) .... Every morning 22)  Trazodone Hcl 100 Mg Tabs (Trazodone hcl) .... 2 at bedtime 23)  Levsin/sl 0.125 Mg Subl (Hyoscyamine sulfate) .Marland Kitchen.. 1-2 tablets by mouth four times a day as needed 24)  Colace 100 Mg Caps (Docusate sodium) .Marland Kitchen.. 1 once daily 25)  Aspir-low 81 Mg Tbec (Aspirin) .Marland Kitchen.. 1 once daily 26)  Hydroxyzine Hcl 50 Mg Tabs (Hydroxyzine hcl) .Marland Kitchen.. 1 q6h as needed 27)  Risperdal 2 Mg Tabs (Risperidone) .Marland Kitchen.. 1 at bedtime 28)  Prazosin Hcl 2 Mg Caps (Prazosin hcl) .... 3 at bedtime 29)  Budeprion Xl 300 Mg Xr24h-tab (Bupropion hcl) .Marland Kitchen.. 1 once daily 30)  Metoprolol Succinate 25 Mg Xr24h-tab (Metoprolol succinate) .... Once daily 31)  Omeprazole 20 Mg Cpdr (Omeprazole) .Marland Kitchen.. 1 two times a day 32)  Abilify 2 Mg Tabs (Aripiprazole) .... Qd 33)  Symbicort 160-4.5 Mcg/act Aero (Budesonide-formoterol fumarate) .... 2 puffs two times a day 34)  Klor-con M20 20 Meq Cr-tabs (Potassium chloride crys cr) .... Once daily 35)  Flexeril 10 Mg Tabs (Cyclobenzaprine hcl) .... Three times  a day as needed spasms 36)  Sumatriptan Succinate 100 Mg Tabs (Sumatriptan succinate) .... As needed for migraines  Other Orders: Ketorolac-Toradol 15mg  (J1914) Admin of Therapeutic Inj  intramuscular or subcutaneous (78295)  Patient Instructions: 1)  Please schedule a follow-up appointment as needed .  Prescriptions: SUMATRIPTAN SUCCINATE 100 MG TABS (SUMATRIPTAN SUCCINATE) as needed for migraines  #12 x 5   Entered and Authorized by:   Nelwyn Salisbury MD   Signed by:   Nelwyn Salisbury MD on 11/21/2010   Method used:   [facility]ronically to        [facility] [phone]* (retail)       [address]       [place], [place]  [address]       Ph: [phone]       Fax: [phone]   RxID:   [id number] FLEXERIL 10 MG TABS (CYCLOBENZAPRINE HCL) three times a day as needed spasms  #60 x 5   Entered and Authorized by:   Nelwyn Salisbury MD   Signed by:  Nelwyn Salisbury MD on 11/21/2010   Method used:   Electronically to        [facility] [phone]* (retail)       [address]       [place], [place]  [address]       Ph: [phone]       Fax: [phone]   RxID:   [id number]    Medication Administration  Injection # 1:    Medication: Ketorolac-Toradol 15mg     Diagnosis: HEADACHE (ICD-784.0)    Route: IM    Site: RUOQ gluteus    Exp Date: 08/31/2011    Lot #: [id number]    Mfr: novaplus    Comments: 60mg  given (2mL)    Patient tolerated injection without complications    Given by: Pura Spice, RN (November 21, 2010 10:24 AM)  Orders Added: 1)  Est. Patient Level IV [27253] 2)  Ketorolac-Toradol 15mg  [J1885] 3)  Admin of Therapeutic Inj  intramuscular or subcutaneous [66440]

## 2010-12-15 NOTE — Letter (Signed)
Summary: [facility]  [facility]   Imported By: Lennie Odor 12/08/2010 14:58:27  _____________________________________________________________________  External Attachment:    Type:   Image     Comment:   External Document

## 2011-01-14 LAB — GLUCOSE, CAPILLARY
Glucose-Capillary: 108 mg/dL — ABNORMAL HIGH (ref 70–99)
Glucose-Capillary: 133 mg/dL — ABNORMAL HIGH (ref 70–99)
Glucose-Capillary: 135 mg/dL — ABNORMAL HIGH (ref 70–99)
Glucose-Capillary: 148 mg/dL — ABNORMAL HIGH (ref 70–99)
Glucose-Capillary: 159 mg/dL — ABNORMAL HIGH (ref 70–99)

## 2011-01-15 LAB — COMPREHENSIVE METABOLIC PANEL
AST: 26 U/L (ref 0–37)
Albumin: 3.5 g/dL (ref 3.5–5.2)
Calcium: 9.3 mg/dL (ref 8.4–10.5)
Chloride: 109 mEq/L (ref 96–112)
Creatinine, Ser: 0.7 mg/dL (ref 0.4–1.2)
GFR calc Af Amer: >60 mL/min (ref >60)
Total Protein: 7 g/dL (ref 6.0–8.3)

## 2011-01-15 LAB — DIFFERENTIAL
Eosinophils Relative: 2 % (ref 0–5)
Lymphocytes Relative: 25 % (ref 12–46)
Lymphs Abs: 2.2 10*3/uL (ref 0.7–4.0)
Monocytes Absolute: 0.6 10*3/uL (ref 0.1–1.0)

## 2011-01-15 LAB — CBC
MCH: 31.9 pg (ref 26.0–34.0)
MCHC: 34.4 g/dL (ref 30.0–36.0)
Platelets: 195 10*3/uL (ref 150–400)
RBC: 4.15 MIL/uL (ref 3.87–5.11)

## 2011-01-15 LAB — SURGICAL PCR SCREEN: MRSA, PCR: NEGATIVE

## 2011-01-25 ENCOUNTER — Encounter: Payer: Self-pay | Admitting: Family Medicine

## 2011-02-13 LAB — POCT URINALYSIS DIP (DEVICE)
Ketones, ur: NEGATIVE mg/dL
Nitrite: NEGATIVE
Protein, ur: NEGATIVE mg/dL

## 2011-02-13 LAB — POCT PREGNANCY, URINE: Preg Test, Ur: NEGATIVE

## 2011-02-21 ENCOUNTER — Ambulatory Visit: Payer: Self-pay | Admitting: Psychology

## 2011-02-23 ENCOUNTER — Ambulatory Visit: Payer: Self-pay | Admitting: Psychology

## 2011-02-28 ENCOUNTER — Ambulatory Visit: Payer: Self-pay | Admitting: Psychology

## 2011-03-02 ENCOUNTER — Ambulatory Visit (INDEPENDENT_AMBULATORY_CARE_PROVIDER_SITE_OTHER): Payer: BC Managed Care – PPO | Admitting: Psychology

## 2011-03-02 DIAGNOSIS — F4481 Dissociative identity disorder: Secondary | ICD-10-CM

## 2011-03-07 ENCOUNTER — Ambulatory Visit (INDEPENDENT_AMBULATORY_CARE_PROVIDER_SITE_OTHER): Payer: BC Managed Care – PPO | Admitting: Psychology

## 2011-03-07 DIAGNOSIS — F4310 Post-traumatic stress disorder, unspecified: Secondary | ICD-10-CM

## 2011-03-07 DIAGNOSIS — F4481 Dissociative identity disorder: Secondary | ICD-10-CM

## 2011-03-09 ENCOUNTER — Ambulatory Visit (INDEPENDENT_AMBULATORY_CARE_PROVIDER_SITE_OTHER): Payer: BC Managed Care – PPO | Admitting: Psychology

## 2011-03-09 DIAGNOSIS — F4310 Post-traumatic stress disorder, unspecified: Secondary | ICD-10-CM

## 2011-03-09 DIAGNOSIS — F4481 Dissociative identity disorder: Secondary | ICD-10-CM

## 2011-03-14 ENCOUNTER — Ambulatory Visit (INDEPENDENT_AMBULATORY_CARE_PROVIDER_SITE_OTHER): Payer: BC Managed Care – PPO | Admitting: Psychology

## 2011-03-14 DIAGNOSIS — F4310 Post-traumatic stress disorder, unspecified: Secondary | ICD-10-CM

## 2011-03-14 DIAGNOSIS — F4481 Dissociative identity disorder: Secondary | ICD-10-CM

## 2011-03-14 NOTE — Assessment & Plan Note (Signed)
[facility]                                 ON-CALL NOTE   MODINE, OPPENHEIMER                   MRN:          [id number]  DATE:11/24/2008                            DOB:          1976-06-25    PRIMARY CARE Anysa Tacey:  Tera Mater. Clent Ridges, MD   I received a call from [facility] about a stat laboratory.  In terms  of abnormals, the patient had mildly decreased platelets and a mildly  hypoglycemic glucose.  Otherwise, per [facility], there were no  critical lab values, and there were none that were distinctly abnormal,  and the patient had a normal white cell count.     Juleen China, MD    STC/MedQ  DD: 11/24/2008  DT: 11/25/2008  Job #: [id number]

## 2011-03-16 ENCOUNTER — Ambulatory Visit (INDEPENDENT_AMBULATORY_CARE_PROVIDER_SITE_OTHER): Payer: BC Managed Care – PPO | Admitting: Psychology

## 2011-03-16 DIAGNOSIS — F4310 Post-traumatic stress disorder, unspecified: Secondary | ICD-10-CM

## 2011-03-16 DIAGNOSIS — F4481 Dissociative identity disorder: Secondary | ICD-10-CM
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
## 2011-03-17 NOTE — Discharge Summary (Signed)
NAME:  Miranda, [name]                      ACCOUNT NO.:  [id number]   MEDICAL RECORD NO.:  [id number]                   PATIENT TYPE:  IPS   LOCATION:  0301                                 [facility]ILITY[facility]   PHYSICIAN:  Jeanice Lim, M.D.              DATE OF BIRTH:  [birth year]   DATE OF ADMISSION:  11/11/2003  DATE OF DISCHARGE:  11/16/2003                                 DISCHARGE SUMMARY   IDENTIFYING DATA:  This is a 35 year old married Caucasian female with a  history of dissociative episodes, borderline personality disorder, PTSD.  She stopped Zoloft before Christmas, forgot to fill the prescription, and  then started alcohol around [place] through for several weeks, drinking  several drinks a day, taking Ativan pills.  She does not remember taking  Ativan in an attempt to overdose and kill herself.  She endorsed losing  track of time, having blackout periods.  She was tearful, agitated, and had  decreased sleep.   MEDICATIONS:  1. Zoloft 100 mg a day.  2. Advair.  3. Singulair.  4. Nexium.   DRUG ALLERGIES:  PENICILLIN, ULTRAM, and HYDROCODONE.   PHYSICAL EXAMINATION:  GENERAL:  Essentially within normal limits.  NEUROLOGIC:  Nonfocal.   LABORATORY DATA:  Routine admission labs:  Essentially within normal limits.   MENTAL STATUS EXAM:  Fully alert, tearful, poor eye contact, cooperative,  pleasant, able to appreciate humor.  Speech: Within normal limits.  Mood:  Depressed, anxious.  Thought process: Goal directed; positive agitation,  positive suicidal ideation, no hallucinations.  Cognitive: Intact.  Judgment  and insight: Fair.   ADMISSION DIAGNOSES:   AXIS I:  1. Major depressive disorder, recurrent, severe.  2. Posttraumatic stress disorder.   AXIS II:  Borderline personality disorder.   AXIS III:  1. Status post polypharmacy overdose.  2. Asthma.  3. Gastroesophageal reflux disease.   AXIS IV:  Moderate problems with primary support group.   AXIS V:  25/60   HOSPITAL COURSE:  The patient was admitted, ordered routine p.r.n.  medications, underwent further monitoring, and was encouraged to participate  in individual, group, and milieu therapy.  The patient had safety checks and  was resumed on psychotropics.  Motrin was used for pain, Zoloft targeting  depressive symptoms, BuSpar for anxiety was restarted with a history of  positive response, Vistaril p.r.n.  The patient reported feeling more  stable, sleeping better, and improved mood gradually as she was stabilized  on medications.  She participated in therapy and aftercare planning.   CONDITION ON DISCHARGE:  She was discharged in improved condition.  Mood was  more euthymic.  Affect: Brighter.  No dangerous ideation or psychotic  symptoms.  Motivated to follow up.   DISCHARGE MEDICATIONS:  1. Zoloft 100 mg q.a.m.  2. BuSpar 10 mg t.i.d.  3. Trazodone 100 mg one half to one p.r.n. insomnia.  4. Nexium 40 mg b.i.d.  5. Singulair 10  mg q.a.m.  6. Advair inhaler two times a day.  7. Risperdal 1 mg q.h.s.   FOLLOW UP:  The patient was discharged to follow up with Evalina Field and Dr.  Leone Haven on January 18 at 12:00.   DISCHARGE DIAGNOSES:   AXIS I:  1. Major depressive disorder, recurrent, severe.  2. Posttraumatic stress disorder.   AXIS II:  Borderline personality disorder.   AXIS III:  1. Status post polypharmacy overdose.  2. Asthma.  3. Gastroesophageal reflux disease.   AXIS IV:  Moderate problems with primary support group.   AXIS V:  Global assessment of functioning on discharge was 55.                                               Jeanice Lim, M.D.    JEM/MEDQ  D:  12/15/2003  T:  12/16/2003  Job:  [id number]

## 2011-03-21 ENCOUNTER — Ambulatory Visit (INDEPENDENT_AMBULATORY_CARE_PROVIDER_SITE_OTHER): Payer: BC Managed Care – PPO | Admitting: Psychology

## 2011-03-21 DIAGNOSIS — F4310 Post-traumatic stress disorder, unspecified: Secondary | ICD-10-CM

## 2011-03-28 ENCOUNTER — Ambulatory Visit (INDEPENDENT_AMBULATORY_CARE_PROVIDER_SITE_OTHER): Payer: BC Managed Care – PPO | Admitting: Psychology

## 2011-03-28 DIAGNOSIS — F4481 Dissociative identity disorder: Secondary | ICD-10-CM

## 2011-03-28 DIAGNOSIS — F4310 Post-traumatic stress disorder, unspecified: Secondary | ICD-10-CM

## 2011-04-13 ENCOUNTER — Ambulatory Visit (INDEPENDENT_AMBULATORY_CARE_PROVIDER_SITE_OTHER): Payer: BC Managed Care – PPO | Admitting: Psychology

## 2011-04-13 DIAGNOSIS — F4481 Dissociative identity disorder: Secondary | ICD-10-CM

## 2011-04-13 DIAGNOSIS — F4310 Post-traumatic stress disorder, unspecified: Secondary | ICD-10-CM

## 2011-04-18 ENCOUNTER — Ambulatory Visit (INDEPENDENT_AMBULATORY_CARE_PROVIDER_SITE_OTHER): Payer: BC Managed Care – PPO | Admitting: Psychology

## 2011-04-18 DIAGNOSIS — F4481 Dissociative identity disorder: Secondary | ICD-10-CM

## 2011-04-18 DIAGNOSIS — F4310 Post-traumatic stress disorder, unspecified: Secondary | ICD-10-CM

## 2011-04-20 ENCOUNTER — Ambulatory Visit (INDEPENDENT_AMBULATORY_CARE_PROVIDER_SITE_OTHER): Payer: BC Managed Care – PPO | Admitting: Psychology

## 2011-04-20 DIAGNOSIS — F4310 Post-traumatic stress disorder, unspecified: Secondary | ICD-10-CM

## 2011-04-20 DIAGNOSIS — F4481 Dissociative identity disorder: Secondary | ICD-10-CM

## 2011-04-25 ENCOUNTER — Ambulatory Visit (INDEPENDENT_AMBULATORY_CARE_PROVIDER_SITE_OTHER): Payer: BC Managed Care – PPO | Admitting: Psychology

## 2011-04-25 DIAGNOSIS — F4310 Post-traumatic stress disorder, unspecified: Secondary | ICD-10-CM

## 2011-04-25 DIAGNOSIS — F4481 Dissociative identity disorder: Secondary | ICD-10-CM

## 2011-04-27 ENCOUNTER — Ambulatory Visit (INDEPENDENT_AMBULATORY_CARE_PROVIDER_SITE_OTHER): Payer: BC Managed Care – PPO | Admitting: Psychology

## 2011-04-27 DIAGNOSIS — F4310 Post-traumatic stress disorder, unspecified: Secondary | ICD-10-CM

## 2011-04-27 DIAGNOSIS — F4481 Dissociative identity disorder: Secondary | ICD-10-CM

## 2011-04-30 ENCOUNTER — Emergency Department (HOSPITAL_COMMUNITY)
Admission: EM | Admit: 2011-04-30 | Discharge: 2011-04-30 | Disposition: A | Discharge status: Home / Self Care | Payer: BC Managed Care – PPO | Attending: Emergency Medicine | Admitting: Emergency Medicine

## 2011-04-30 ENCOUNTER — Emergency Department (HOSPITAL_COMMUNITY): Payer: BC Managed Care – PPO

## 2011-04-30 DIAGNOSIS — S060X9A Concussion with loss of consciousness of unspecified duration, initial encounter: Secondary | ICD-10-CM | POA: Insufficient documentation

## 2011-04-30 DIAGNOSIS — W010XXA Fall on same level from slipping, tripping and stumbling without subsequent striking against object, initial encounter: Secondary | ICD-10-CM | POA: Insufficient documentation

## 2011-04-30 DIAGNOSIS — F17200 Nicotine dependence, unspecified, uncomplicated: Secondary | ICD-10-CM | POA: Insufficient documentation

## 2011-04-30 DIAGNOSIS — E119 Type 2 diabetes mellitus without complications: Secondary | ICD-10-CM | POA: Insufficient documentation

## 2011-04-30 DIAGNOSIS — Z794 Long term (current) use of insulin: Secondary | ICD-10-CM | POA: Insufficient documentation

## 2011-04-30 DIAGNOSIS — E039 Hypothyroidism, unspecified: Secondary | ICD-10-CM | POA: Insufficient documentation

## 2011-05-04 ENCOUNTER — Ambulatory Visit (INDEPENDENT_AMBULATORY_CARE_PROVIDER_SITE_OTHER): Payer: BC Managed Care – PPO | Admitting: Psychology

## 2011-05-04 DIAGNOSIS — F4481 Dissociative identity disorder: Secondary | ICD-10-CM

## 2011-05-04 DIAGNOSIS — F4310 Post-traumatic stress disorder, unspecified: Secondary | ICD-10-CM

## 2011-05-09 ENCOUNTER — Ambulatory Visit (INDEPENDENT_AMBULATORY_CARE_PROVIDER_SITE_OTHER): Payer: BC Managed Care – PPO | Admitting: Psychology

## 2011-05-09 DIAGNOSIS — F4310 Post-traumatic stress disorder, unspecified: Secondary | ICD-10-CM

## 2011-05-09 DIAGNOSIS — F4481 Dissociative identity disorder: Secondary | ICD-10-CM

## 2011-05-16 ENCOUNTER — Ambulatory Visit (INDEPENDENT_AMBULATORY_CARE_PROVIDER_SITE_OTHER): Payer: BC Managed Care – PPO | Admitting: Psychology

## 2011-05-16 DIAGNOSIS — F4310 Post-traumatic stress disorder, unspecified: Secondary | ICD-10-CM

## 2011-05-16 DIAGNOSIS — F4481 Dissociative identity disorder: Secondary | ICD-10-CM

## 2011-05-23 ENCOUNTER — Ambulatory Visit (INDEPENDENT_AMBULATORY_CARE_PROVIDER_SITE_OTHER): Payer: BC Managed Care – PPO | Admitting: Psychology

## 2011-05-23 DIAGNOSIS — F4310 Post-traumatic stress disorder, unspecified: Secondary | ICD-10-CM

## 2011-05-23 DIAGNOSIS — F4481 Dissociative identity disorder: Secondary | ICD-10-CM

## 2011-05-25 ENCOUNTER — Ambulatory Visit (INDEPENDENT_AMBULATORY_CARE_PROVIDER_SITE_OTHER): Payer: BC Managed Care – PPO | Admitting: Psychology

## 2011-05-25 DIAGNOSIS — F4310 Post-traumatic stress disorder, unspecified: Secondary | ICD-10-CM

## 2011-05-25 DIAGNOSIS — F4481 Dissociative identity disorder: Secondary | ICD-10-CM

## 2011-05-30 ENCOUNTER — Ambulatory Visit (INDEPENDENT_AMBULATORY_CARE_PROVIDER_SITE_OTHER): Payer: BC Managed Care – PPO | Admitting: Psychology

## 2011-05-30 DIAGNOSIS — F4481 Dissociative identity disorder: Secondary | ICD-10-CM

## 2011-05-30 DIAGNOSIS — F4310 Post-traumatic stress disorder, unspecified: Secondary | ICD-10-CM

## 2011-06-01 ENCOUNTER — Ambulatory Visit (INDEPENDENT_AMBULATORY_CARE_PROVIDER_SITE_OTHER): Payer: BC Managed Care – PPO | Admitting: Psychology

## 2011-06-01 DIAGNOSIS — F4481 Dissociative identity disorder: Secondary | ICD-10-CM

## 2011-06-01 DIAGNOSIS — F4310 Post-traumatic stress disorder, unspecified: Secondary | ICD-10-CM

## 2011-06-06 ENCOUNTER — Ambulatory Visit (INDEPENDENT_AMBULATORY_CARE_PROVIDER_SITE_OTHER): Payer: BC Managed Care – PPO | Admitting: Psychology

## 2011-06-06 DIAGNOSIS — F4310 Post-traumatic stress disorder, unspecified: Secondary | ICD-10-CM

## 2011-06-06 DIAGNOSIS — F4481 Dissociative identity disorder: Secondary | ICD-10-CM

## 2011-06-08 ENCOUNTER — Ambulatory Visit (INDEPENDENT_AMBULATORY_CARE_PROVIDER_SITE_OTHER): Payer: BC Managed Care – PPO | Admitting: Psychology

## 2011-06-08 DIAGNOSIS — F4310 Post-traumatic stress disorder, unspecified: Secondary | ICD-10-CM

## 2011-06-08 DIAGNOSIS — F4481 Dissociative identity disorder: Secondary | ICD-10-CM

## 2011-06-15 ENCOUNTER — Ambulatory Visit (INDEPENDENT_AMBULATORY_CARE_PROVIDER_SITE_OTHER): Payer: BC Managed Care – PPO | Admitting: Psychology

## 2011-06-15 DIAGNOSIS — F4310 Post-traumatic stress disorder, unspecified: Secondary | ICD-10-CM

## 2011-06-15 DIAGNOSIS — F4481 Dissociative identity disorder: Secondary | ICD-10-CM

## 2011-06-16 ENCOUNTER — Ambulatory Visit (INDEPENDENT_AMBULATORY_CARE_PROVIDER_SITE_OTHER): Payer: BC Managed Care – PPO | Admitting: Family Medicine

## 2011-06-16 ENCOUNTER — Encounter: Payer: Self-pay | Admitting: Family Medicine

## 2011-06-16 DIAGNOSIS — S0003XA Contusion of scalp, initial encounter: Secondary | ICD-10-CM

## 2011-06-16 DIAGNOSIS — S1093XA Contusion of unspecified part of neck, initial encounter: Secondary | ICD-10-CM

## 2011-06-16 DIAGNOSIS — S060X9A Concussion with loss of consciousness of unspecified duration, initial encounter: Secondary | ICD-10-CM

## 2011-06-16 DIAGNOSIS — S060XAA Concussion with loss of consciousness status unknown, initial encounter: Secondary | ICD-10-CM

## 2011-06-16 DIAGNOSIS — R51 Headache: Secondary | ICD-10-CM

## 2011-06-16 DIAGNOSIS — S0093XA Contusion of unspecified part of head, initial encounter: Secondary | ICD-10-CM

## 2011-06-16 MED ORDER — OXYCODONE-ACETAMINOPHEN 7.5-500 MG PO TABS: 1.0000 | ORAL_TABLET | Freq: Four times a day (QID) | ORAL | Status: DC | PRN
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
## 2011-06-20 ENCOUNTER — Ambulatory Visit (INDEPENDENT_AMBULATORY_CARE_PROVIDER_SITE_OTHER): Payer: BC Managed Care – PPO | Admitting: Psychology

## 2011-06-20 DIAGNOSIS — F4310 Post-traumatic stress disorder, unspecified: Secondary | ICD-10-CM

## 2011-06-20 DIAGNOSIS — F4481 Dissociative identity disorder: Secondary | ICD-10-CM

## 2011-06-22 ENCOUNTER — Ambulatory Visit: Payer: BC Managed Care – PPO | Admitting: Psychology

## 2011-06-25 ENCOUNTER — Other Ambulatory Visit: Payer: Self-pay | Admitting: Family Medicine

## 2011-06-26 NOTE — Telephone Encounter (Signed)
Script sent e-scribe 

## 2011-06-27 ENCOUNTER — Ambulatory Visit: Payer: BC Managed Care – PPO | Admitting: Psychology

## 2011-06-27 ENCOUNTER — Emergency Department (HOSPITAL_COMMUNITY)
Admission: EM | Admit: 2011-06-27 | Discharge: 2011-06-27 | Disposition: A | Discharge status: Home / Self Care | Payer: No Typology Code available for payment source | Attending: Emergency Medicine | Admitting: Emergency Medicine

## 2011-06-27 DIAGNOSIS — E119 Type 2 diabetes mellitus without complications: Secondary | ICD-10-CM | POA: Insufficient documentation

## 2011-06-27 DIAGNOSIS — T1490XA Injury, unspecified, initial encounter: Secondary | ICD-10-CM | POA: Insufficient documentation

## 2011-06-27 DIAGNOSIS — M549 Dorsalgia, unspecified: Secondary | ICD-10-CM | POA: Insufficient documentation

## 2011-06-27 DIAGNOSIS — Z794 Long term (current) use of insulin: Secondary | ICD-10-CM | POA: Insufficient documentation

## 2011-06-27 DIAGNOSIS — Y92410 Unspecified street and highway as the place of occurrence of the external cause: Secondary | ICD-10-CM | POA: Insufficient documentation

## 2011-06-29 ENCOUNTER — Ambulatory Visit (INDEPENDENT_AMBULATORY_CARE_PROVIDER_SITE_OTHER): Payer: BC Managed Care – PPO | Admitting: Psychology

## 2011-06-29 DIAGNOSIS — F4310 Post-traumatic stress disorder, unspecified: Secondary | ICD-10-CM

## 2011-06-29 DIAGNOSIS — F4481 Dissociative identity disorder: Secondary | ICD-10-CM

## 2011-07-09 ENCOUNTER — Other Ambulatory Visit: Payer: Self-pay | Admitting: Family Medicine

## 2011-07-11 ENCOUNTER — Ambulatory Visit (INDEPENDENT_AMBULATORY_CARE_PROVIDER_SITE_OTHER): Payer: BC Managed Care – PPO | Admitting: Psychology

## 2011-07-11 DIAGNOSIS — F4481 Dissociative identity disorder: Secondary | ICD-10-CM

## 2011-07-11 DIAGNOSIS — F4310 Post-traumatic stress disorder, unspecified: Secondary | ICD-10-CM

## 2011-07-13 ENCOUNTER — Ambulatory Visit (INDEPENDENT_AMBULATORY_CARE_PROVIDER_SITE_OTHER): Payer: BC Managed Care – PPO | Admitting: Psychology

## 2011-07-13 DIAGNOSIS — F4310 Post-traumatic stress disorder, unspecified: Secondary | ICD-10-CM

## 2011-07-13 DIAGNOSIS — F4481 Dissociative identity disorder: Secondary | ICD-10-CM

## 2011-07-18 ENCOUNTER — Ambulatory Visit (INDEPENDENT_AMBULATORY_CARE_PROVIDER_SITE_OTHER): Payer: BC Managed Care – PPO | Admitting: Psychology

## 2011-07-18 DIAGNOSIS — F4310 Post-traumatic stress disorder, unspecified: Secondary | ICD-10-CM

## 2011-07-18 DIAGNOSIS — F4481 Dissociative identity disorder: Secondary | ICD-10-CM

## 2011-07-20 ENCOUNTER — Ambulatory Visit (INDEPENDENT_AMBULATORY_CARE_PROVIDER_SITE_OTHER): Payer: BC Managed Care – PPO | Admitting: Psychology

## 2011-07-20 DIAGNOSIS — F4481 Dissociative identity disorder: Secondary | ICD-10-CM

## 2011-07-20 DIAGNOSIS — F4310 Post-traumatic stress disorder, unspecified: Secondary | ICD-10-CM

## 2011-07-24 LAB — POCT I-STAT, CHEM 8
Chloride: 105
HCT: 39
Hemoglobin: 13.3
Potassium: 3.7
Sodium: 137

## 2011-07-24 LAB — COMPREHENSIVE METABOLIC PANEL
Albumin: 3.6
Alkaline Phosphatase: 42
BUN: 7
Chloride: 101
Creatinine, Ser: 0.6
Glucose, Bld: 117 — ABNORMAL HIGH
Total Bilirubin: 0.6

## 2011-07-24 LAB — CBC
HCT: 38.2
Hemoglobin: 13.2
MCV: 88.6
Platelets: 232
RDW: 14.4
WBC: 10.7 — ABNORMAL HIGH

## 2011-07-24 LAB — DIFFERENTIAL
Basophils Absolute: 0.1
Basophils Relative: 1
Lymphocytes Relative: 25
Monocytes Absolute: 0.5
Neutro Abs: 7.2
Neutrophils Relative %: 67

## 2011-07-24 LAB — D-DIMER, QUANTITATIVE: D-Dimer, Quant: 0.29

## 2011-07-24 LAB — POCT CARDIAC MARKERS
CKMB, poc: <1 — ABNORMAL LOW
Myoglobin, poc: 31.8
Operator id: 295131
Troponin i, poc: <0.05

## 2011-07-24 LAB — LIPASE, BLOOD: Lipase: 27

## 2011-07-25 ENCOUNTER — Ambulatory Visit (INDEPENDENT_AMBULATORY_CARE_PROVIDER_SITE_OTHER): Payer: BC Managed Care – PPO | Admitting: Psychology

## 2011-07-25 DIAGNOSIS — F4481 Dissociative identity disorder: Secondary | ICD-10-CM

## 2011-07-25 DIAGNOSIS — F4310 Post-traumatic stress disorder, unspecified: Secondary | ICD-10-CM

## 2011-07-27 ENCOUNTER — Ambulatory Visit (INDEPENDENT_AMBULATORY_CARE_PROVIDER_SITE_OTHER): Payer: BC Managed Care – PPO | Admitting: Psychology

## 2011-07-27 DIAGNOSIS — F4481 Dissociative identity disorder: Secondary | ICD-10-CM

## 2011-07-27 DIAGNOSIS — F4310 Post-traumatic stress disorder, unspecified: Secondary | ICD-10-CM

## 2011-08-03 ENCOUNTER — Ambulatory Visit (INDEPENDENT_AMBULATORY_CARE_PROVIDER_SITE_OTHER): Payer: BC Managed Care – PPO | Admitting: Psychology

## 2011-08-03 DIAGNOSIS — F4481 Dissociative identity disorder: Secondary | ICD-10-CM

## 2011-08-03 DIAGNOSIS — F4310 Post-traumatic stress disorder, unspecified: Secondary | ICD-10-CM

## 2011-08-08 ENCOUNTER — Ambulatory Visit (INDEPENDENT_AMBULATORY_CARE_PROVIDER_SITE_OTHER): Payer: BC Managed Care – PPO | Admitting: Psychology

## 2011-08-08 DIAGNOSIS — F4310 Post-traumatic stress disorder, unspecified: Secondary | ICD-10-CM

## 2011-08-08 DIAGNOSIS — F4481 Dissociative identity disorder: Secondary | ICD-10-CM

## 2011-08-15 ENCOUNTER — Ambulatory Visit (INDEPENDENT_AMBULATORY_CARE_PROVIDER_SITE_OTHER): Payer: BC Managed Care – PPO | Admitting: Psychology

## 2011-08-15 DIAGNOSIS — F4310 Post-traumatic stress disorder, unspecified: Secondary | ICD-10-CM

## 2011-08-15 DIAGNOSIS — F4481 Dissociative identity disorder: Secondary | ICD-10-CM

## 2011-08-22 ENCOUNTER — Ambulatory Visit: Payer: BC Managed Care – PPO | Admitting: Psychology

## 2011-08-24 ENCOUNTER — Ambulatory Visit (INDEPENDENT_AMBULATORY_CARE_PROVIDER_SITE_OTHER): Payer: BC Managed Care – PPO | Admitting: Psychology

## 2011-08-24 DIAGNOSIS — F4481 Dissociative identity disorder: Secondary | ICD-10-CM

## 2011-08-24 DIAGNOSIS — F4310 Post-traumatic stress disorder, unspecified: Secondary | ICD-10-CM

## 2011-08-29 ENCOUNTER — Ambulatory Visit (INDEPENDENT_AMBULATORY_CARE_PROVIDER_SITE_OTHER): Payer: BC Managed Care – PPO | Admitting: Psychology

## 2011-08-29 DIAGNOSIS — F4481 Dissociative identity disorder: Secondary | ICD-10-CM

## 2011-08-29 DIAGNOSIS — F4310 Post-traumatic stress disorder, unspecified: Secondary | ICD-10-CM

## 2011-08-31 ENCOUNTER — Ambulatory Visit (INDEPENDENT_AMBULATORY_CARE_PROVIDER_SITE_OTHER): Payer: BC Managed Care – PPO | Admitting: Psychology

## 2011-08-31 DIAGNOSIS — F4310 Post-traumatic stress disorder, unspecified: Secondary | ICD-10-CM

## 2011-08-31 DIAGNOSIS — F4481 Dissociative identity disorder: Secondary | ICD-10-CM

## 2011-09-03 ENCOUNTER — Other Ambulatory Visit: Payer: Self-pay | Admitting: Family Medicine

## 2011-09-04 ENCOUNTER — Other Ambulatory Visit: Payer: Self-pay | Admitting: Family Medicine

## 2011-09-05 ENCOUNTER — Ambulatory Visit (INDEPENDENT_AMBULATORY_CARE_PROVIDER_SITE_OTHER): Payer: BC Managed Care – PPO | Admitting: Psychology

## 2011-09-05 DIAGNOSIS — F4310 Post-traumatic stress disorder, unspecified: Secondary | ICD-10-CM

## 2011-09-05 DIAGNOSIS — F4481 Dissociative identity disorder: Secondary | ICD-10-CM

## 2011-09-07 ENCOUNTER — Ambulatory Visit (INDEPENDENT_AMBULATORY_CARE_PROVIDER_SITE_OTHER): Payer: BC Managed Care – PPO | Admitting: Psychology

## 2011-09-07 DIAGNOSIS — F4481 Dissociative identity disorder: Secondary | ICD-10-CM

## 2011-09-07 DIAGNOSIS — F4310 Post-traumatic stress disorder, unspecified: Secondary | ICD-10-CM

## 2011-09-12 ENCOUNTER — Ambulatory Visit (INDEPENDENT_AMBULATORY_CARE_PROVIDER_SITE_OTHER): Payer: BC Managed Care – PPO | Admitting: Psychology

## 2011-09-12 DIAGNOSIS — F4481 Dissociative identity disorder: Secondary | ICD-10-CM

## 2011-09-12 DIAGNOSIS — F4310 Post-traumatic stress disorder, unspecified: Secondary | ICD-10-CM

## 2011-09-14 ENCOUNTER — Ambulatory Visit (INDEPENDENT_AMBULATORY_CARE_PROVIDER_SITE_OTHER): Payer: BC Managed Care – PPO | Admitting: Psychology

## 2011-09-14 DIAGNOSIS — F4310 Post-traumatic stress disorder, unspecified: Secondary | ICD-10-CM

## 2011-09-14 DIAGNOSIS — F4481 Dissociative identity disorder: Secondary | ICD-10-CM

## 2011-09-19 ENCOUNTER — Ambulatory Visit (INDEPENDENT_AMBULATORY_CARE_PROVIDER_SITE_OTHER): Payer: BC Managed Care – PPO | Admitting: Psychology

## 2011-09-19 DIAGNOSIS — F4310 Post-traumatic stress disorder, unspecified: Secondary | ICD-10-CM

## 2011-09-19 DIAGNOSIS — F4481 Dissociative identity disorder: Secondary | ICD-10-CM

## 2011-09-26 ENCOUNTER — Ambulatory Visit (INDEPENDENT_AMBULATORY_CARE_PROVIDER_SITE_OTHER): Payer: BC Managed Care – PPO | Admitting: Psychology

## 2011-09-26 DIAGNOSIS — F4481 Dissociative identity disorder: Secondary | ICD-10-CM

## 2011-09-26 DIAGNOSIS — F4310 Post-traumatic stress disorder, unspecified: Secondary | ICD-10-CM

## 2011-09-29 ENCOUNTER — Ambulatory Visit (INDEPENDENT_AMBULATORY_CARE_PROVIDER_SITE_OTHER): Payer: BC Managed Care – PPO | Admitting: Psychology

## 2011-09-29 DIAGNOSIS — F4481 Dissociative identity disorder: Secondary | ICD-10-CM

## 2011-09-29 DIAGNOSIS — F4310 Post-traumatic stress disorder, unspecified: Secondary | ICD-10-CM

## 2011-10-03 ENCOUNTER — Ambulatory Visit: Payer: BC Managed Care – PPO | Admitting: Psychology

## 2011-10-05 ENCOUNTER — Ambulatory Visit (INDEPENDENT_AMBULATORY_CARE_PROVIDER_SITE_OTHER): Payer: BC Managed Care – PPO | Admitting: Psychology

## 2011-10-05 DIAGNOSIS — F4481 Dissociative identity disorder: Secondary | ICD-10-CM

## 2011-10-05 DIAGNOSIS — F4310 Post-traumatic stress disorder, unspecified: Secondary | ICD-10-CM

## 2011-10-10 ENCOUNTER — Ambulatory Visit: Payer: BC Managed Care – PPO | Admitting: Psychology

## 2011-10-12 ENCOUNTER — Other Ambulatory Visit: Payer: Self-pay | Admitting: Family Medicine

## 2011-10-12 ENCOUNTER — Ambulatory Visit (INDEPENDENT_AMBULATORY_CARE_PROVIDER_SITE_OTHER): Payer: BC Managed Care – PPO | Admitting: Psychology

## 2011-10-12 DIAGNOSIS — F4310 Post-traumatic stress disorder, unspecified: Secondary | ICD-10-CM

## 2011-10-12 DIAGNOSIS — F4481 Dissociative identity disorder: Secondary | ICD-10-CM

## 2011-10-17 ENCOUNTER — Ambulatory Visit (INDEPENDENT_AMBULATORY_CARE_PROVIDER_SITE_OTHER): Payer: BC Managed Care – PPO | Admitting: Psychology

## 2011-10-17 DIAGNOSIS — F4481 Dissociative identity disorder: Secondary | ICD-10-CM

## 2011-10-17 DIAGNOSIS — F4310 Post-traumatic stress disorder, unspecified: Secondary | ICD-10-CM

## 2011-10-18 ENCOUNTER — Ambulatory Visit (INDEPENDENT_AMBULATORY_CARE_PROVIDER_SITE_OTHER): Payer: BC Managed Care – PPO | Admitting: Family Medicine

## 2011-10-18 ENCOUNTER — Encounter: Payer: Self-pay | Admitting: Family Medicine

## 2011-10-18 VITALS — BP 118/76 | HR 123 | Temp 99.2°F | Wt 244.0 lb

## 2011-10-18 DIAGNOSIS — J40 Bronchitis, not specified as acute or chronic: Secondary | ICD-10-CM

## 2011-10-18 MED ORDER — AZITHROMYCIN 250 MG PO TABS: ORAL_TABLET | ORAL | Status: AC

## 2011-10-18 NOTE — Progress Notes (Signed)
  Subjective:    Patient ID: Miranda [name], female    DOB: 12/31/1975, 35 y.o.   MRN: [id number]  HPI Here for 2 days of body aches, low grade fevers, and a dry cough.    Review of Systems  Constitutional: Negative.   HENT: Positive for congestion, postnasal drip and sinus pressure.   Eyes: Negative.   Respiratory: Positive for cough.        Objective:   Physical Exam  Constitutional: She appears well-developed and well-nourished.  HENT:  Right Ear: External ear normal.  Left Ear: External ear normal.  Nose: Nose normal.  Mouth/Throat: Oropharynx is clear and moist. No oropharyngeal exudate.  Eyes: Conjunctivae are normal.  Pulmonary/Chest: Effort normal and breath sounds normal.  Lymphadenopathy:    She has no cervical adenopathy.          Assessment & Plan:  Add Mucinex. Out of work today and tomorrow.

## 2011-10-19 ENCOUNTER — Ambulatory Visit: Payer: BC Managed Care – PPO | Admitting: Psychology

## 2011-10-26 ENCOUNTER — Ambulatory Visit (INDEPENDENT_AMBULATORY_CARE_PROVIDER_SITE_OTHER): Payer: BC Managed Care – PPO | Admitting: Psychology

## 2011-10-26 DIAGNOSIS — F4310 Post-traumatic stress disorder, unspecified: Secondary | ICD-10-CM

## 2011-10-26 DIAGNOSIS — F4481 Dissociative identity disorder: Secondary | ICD-10-CM

## 2011-10-30 ENCOUNTER — Other Ambulatory Visit: Payer: Self-pay | Admitting: Family Medicine

## 2011-10-30 NOTE — Telephone Encounter (Signed)
Pt called and said that Dr Clent Ridges had prescribed a Nicotrol Inhaler to help pt stop smoking. Pt is req a renewal of this med to be [facility] [address]. Pt says that the pharmacy is faxing over a refill request. Pls respond asap.

## 2011-11-01 NOTE — Telephone Encounter (Signed)
I will wait for the fax

## 2011-11-01 NOTE — Telephone Encounter (Signed)
Call this in to use as directed, #1 with 11 rf

## 2011-11-01 NOTE — Telephone Encounter (Signed)
Use as directed, call  In one with 11 rf

## 2011-11-07 ENCOUNTER — Ambulatory Visit (INDEPENDENT_AMBULATORY_CARE_PROVIDER_SITE_OTHER): Payer: BC Managed Care – PPO | Admitting: Psychology

## 2011-11-07 DIAGNOSIS — F4481 Dissociative identity disorder: Secondary | ICD-10-CM

## 2011-11-07 DIAGNOSIS — F4310 Post-traumatic stress disorder, unspecified: Secondary | ICD-10-CM

## 2011-11-09 ENCOUNTER — Ambulatory Visit: Payer: BC Managed Care – PPO | Admitting: Psychology

## 2011-11-09 ENCOUNTER — Other Ambulatory Visit: Payer: Self-pay | Admitting: Family Medicine

## 2011-11-14 ENCOUNTER — Ambulatory Visit (INDEPENDENT_AMBULATORY_CARE_PROVIDER_SITE_OTHER): Payer: BC Managed Care – PPO | Admitting: Psychology

## 2011-11-14 DIAGNOSIS — F4481 Dissociative identity disorder: Secondary | ICD-10-CM

## 2011-11-14 DIAGNOSIS — F4310 Post-traumatic stress disorder, unspecified: Secondary | ICD-10-CM

## 2011-11-16 ENCOUNTER — Ambulatory Visit (INDEPENDENT_AMBULATORY_CARE_PROVIDER_SITE_OTHER): Payer: BC Managed Care – PPO | Admitting: Psychology

## 2011-11-16 DIAGNOSIS — F4310 Post-traumatic stress disorder, unspecified: Secondary | ICD-10-CM

## 2011-11-16 DIAGNOSIS — F4481 Dissociative identity disorder: Secondary | ICD-10-CM

## 2011-11-21 ENCOUNTER — Ambulatory Visit (INDEPENDENT_AMBULATORY_CARE_PROVIDER_SITE_OTHER): Payer: BC Managed Care – PPO | Admitting: Psychology

## 2011-11-21 DIAGNOSIS — F4310 Post-traumatic stress disorder, unspecified: Secondary | ICD-10-CM

## 2011-11-21 DIAGNOSIS — F4481 Dissociative identity disorder: Secondary | ICD-10-CM

## 2011-11-23 ENCOUNTER — Ambulatory Visit (INDEPENDENT_AMBULATORY_CARE_PROVIDER_SITE_OTHER): Payer: BC Managed Care – PPO | Admitting: Psychology

## 2011-11-23 DIAGNOSIS — F4481 Dissociative identity disorder: Secondary | ICD-10-CM

## 2011-11-23 DIAGNOSIS — F4310 Post-traumatic stress disorder, unspecified: Secondary | ICD-10-CM

## 2011-11-27 ENCOUNTER — Other Ambulatory Visit: Payer: Self-pay

## 2011-11-27 MED ORDER — POTASSIUM CHLORIDE CRYS ER 20 MEQ PO TBCR: 20.0000 meq | EXTENDED_RELEASE_TABLET | Freq: Every day | ORAL | Status: DC

## 2011-11-28 ENCOUNTER — Ambulatory Visit (INDEPENDENT_AMBULATORY_CARE_PROVIDER_SITE_OTHER): Payer: BC Managed Care – PPO | Admitting: Psychology

## 2011-11-28 DIAGNOSIS — F4310 Post-traumatic stress disorder, unspecified: Secondary | ICD-10-CM

## 2011-11-28 DIAGNOSIS — F4481 Dissociative identity disorder: Secondary | ICD-10-CM

## 2011-11-30 ENCOUNTER — Ambulatory Visit (INDEPENDENT_AMBULATORY_CARE_PROVIDER_SITE_OTHER): Payer: BC Managed Care – PPO | Admitting: Psychology

## 2011-11-30 DIAGNOSIS — F4310 Post-traumatic stress disorder, unspecified: Secondary | ICD-10-CM

## 2011-11-30 DIAGNOSIS — F4481 Dissociative identity disorder: Secondary | ICD-10-CM

## 2011-12-05 ENCOUNTER — Ambulatory Visit (INDEPENDENT_AMBULATORY_CARE_PROVIDER_SITE_OTHER): Payer: BC Managed Care – PPO | Admitting: Psychology

## 2011-12-05 DIAGNOSIS — F4310 Post-traumatic stress disorder, unspecified: Secondary | ICD-10-CM

## 2011-12-05 DIAGNOSIS — F4481 Dissociative identity disorder: Secondary | ICD-10-CM

## 2011-12-07 ENCOUNTER — Ambulatory Visit (INDEPENDENT_AMBULATORY_CARE_PROVIDER_SITE_OTHER): Payer: BC Managed Care – PPO | Admitting: Psychology

## 2011-12-07 DIAGNOSIS — F4481 Dissociative identity disorder: Secondary | ICD-10-CM

## 2011-12-07 DIAGNOSIS — F4310 Post-traumatic stress disorder, unspecified: Secondary | ICD-10-CM

## 2011-12-12 ENCOUNTER — Ambulatory Visit (INDEPENDENT_AMBULATORY_CARE_PROVIDER_SITE_OTHER): Payer: BC Managed Care – PPO | Admitting: Psychology

## 2011-12-12 DIAGNOSIS — F4481 Dissociative identity disorder: Secondary | ICD-10-CM

## 2011-12-12 DIAGNOSIS — F4310 Post-traumatic stress disorder, unspecified: Secondary | ICD-10-CM

## 2011-12-14 ENCOUNTER — Ambulatory Visit (INDEPENDENT_AMBULATORY_CARE_PROVIDER_SITE_OTHER): Payer: BC Managed Care – PPO | Admitting: Psychology

## 2011-12-14 DIAGNOSIS — F4310 Post-traumatic stress disorder, unspecified: Secondary | ICD-10-CM

## 2011-12-14 DIAGNOSIS — F4481 Dissociative identity disorder: Secondary | ICD-10-CM

## 2011-12-19 ENCOUNTER — Encounter: Payer: Self-pay | Admitting: Family Medicine

## 2011-12-19 ENCOUNTER — Ambulatory Visit (INDEPENDENT_AMBULATORY_CARE_PROVIDER_SITE_OTHER): Payer: BC Managed Care – PPO | Admitting: Family Medicine

## 2011-12-19 ENCOUNTER — Ambulatory Visit (INDEPENDENT_AMBULATORY_CARE_PROVIDER_SITE_OTHER): Payer: BC Managed Care – PPO | Admitting: Psychology

## 2011-12-19 VITALS — BP 118/72 | HR 120 | Temp 98.5°F | Wt 256.0 lb

## 2011-12-19 DIAGNOSIS — F4310 Post-traumatic stress disorder, unspecified: Secondary | ICD-10-CM

## 2011-12-19 DIAGNOSIS — J329 Chronic sinusitis, unspecified: Secondary | ICD-10-CM

## 2011-12-19 DIAGNOSIS — F4481 Dissociative identity disorder: Secondary | ICD-10-CM

## 2011-12-19 MED ORDER — DOXYCYCLINE HYCLATE 100 MG PO CAPS: 100.0000 mg | ORAL_CAPSULE | Freq: Two times a day (BID) | ORAL | Status: AC

## 2011-12-19 NOTE — Progress Notes (Signed)
  Subjective:    Patient ID: Miranda [name], female    DOB: 20-Jun-1976, 36 y.o.   MRN: [id number]  HPI Here for one week of sinus pressure, PND, HA, ST, and coughing up green sputum.    Review of Systems  Constitutional: Negative.   HENT: Positive for congestion, postnasal drip and sinus pressure.   Eyes: Negative.   Respiratory: Positive for cough.        Objective:   Physical Exam  Constitutional: She appears well-developed and well-nourished.  HENT:  Right Ear: External ear normal.  Left Ear: External ear normal.  Nose: Nose normal.  Mouth/Throat: Oropharynx is clear and moist. No oropharyngeal exudate.  Eyes: Conjunctivae are normal.  Pulmonary/Chest: Effort normal and breath sounds normal.  Lymphadenopathy:    She has no cervical adenopathy.          Assessment & Plan:  Add Mucinex

## 2011-12-21 ENCOUNTER — Ambulatory Visit: Payer: BC Managed Care – PPO | Admitting: Psychology

## 2011-12-26 ENCOUNTER — Ambulatory Visit: Payer: BC Managed Care – PPO | Admitting: Psychology

## 2011-12-28 ENCOUNTER — Ambulatory Visit: Payer: BC Managed Care – PPO | Admitting: Psychology

## 2012-01-02 ENCOUNTER — Ambulatory Visit (INDEPENDENT_AMBULATORY_CARE_PROVIDER_SITE_OTHER): Payer: BC Managed Care – PPO | Admitting: Psychology

## 2012-01-02 DIAGNOSIS — F4481 Dissociative identity disorder: Secondary | ICD-10-CM

## 2012-01-02 DIAGNOSIS — F4310 Post-traumatic stress disorder, unspecified: Secondary | ICD-10-CM

## 2012-01-04 ENCOUNTER — Ambulatory Visit (INDEPENDENT_AMBULATORY_CARE_PROVIDER_SITE_OTHER): Payer: BC Managed Care – PPO | Admitting: Psychology

## 2012-01-04 DIAGNOSIS — F4310 Post-traumatic stress disorder, unspecified: Secondary | ICD-10-CM

## 2012-01-04 DIAGNOSIS — F4481 Dissociative identity disorder: Secondary | ICD-10-CM

## 2012-01-06 ENCOUNTER — Other Ambulatory Visit: Payer: Self-pay | Admitting: Family Medicine

## 2012-01-09 ENCOUNTER — Ambulatory Visit (INDEPENDENT_AMBULATORY_CARE_PROVIDER_SITE_OTHER): Payer: BC Managed Care – PPO | Admitting: Psychology

## 2012-01-09 DIAGNOSIS — F4481 Dissociative identity disorder: Secondary | ICD-10-CM

## 2012-01-09 DIAGNOSIS — F4310 Post-traumatic stress disorder, unspecified: Secondary | ICD-10-CM

## 2012-01-11 ENCOUNTER — Ambulatory Visit (INDEPENDENT_AMBULATORY_CARE_PROVIDER_SITE_OTHER): Payer: BC Managed Care – PPO | Admitting: Psychology

## 2012-01-11 DIAGNOSIS — F4481 Dissociative identity disorder: Secondary | ICD-10-CM

## 2012-01-11 DIAGNOSIS — F4310 Post-traumatic stress disorder, unspecified: Secondary | ICD-10-CM

## 2012-01-18 ENCOUNTER — Ambulatory Visit: Payer: BC Managed Care – PPO | Admitting: Psychology

## 2012-01-18 ENCOUNTER — Ambulatory Visit (INDEPENDENT_AMBULATORY_CARE_PROVIDER_SITE_OTHER): Payer: BC Managed Care – PPO | Admitting: Psychology

## 2012-01-18 DIAGNOSIS — F4310 Post-traumatic stress disorder, unspecified: Secondary | ICD-10-CM

## 2012-01-18 DIAGNOSIS — F4481 Dissociative identity disorder: Secondary | ICD-10-CM

## 2012-01-19 ENCOUNTER — Other Ambulatory Visit: Payer: Self-pay | Admitting: Family Medicine

## 2012-01-23 ENCOUNTER — Ambulatory Visit: Payer: BC Managed Care – PPO | Admitting: Psychology

## 2012-01-25 ENCOUNTER — Ambulatory Visit (INDEPENDENT_AMBULATORY_CARE_PROVIDER_SITE_OTHER): Payer: BC Managed Care – PPO | Admitting: Psychology

## 2012-01-25 DIAGNOSIS — F4310 Post-traumatic stress disorder, unspecified: Secondary | ICD-10-CM

## 2012-01-25 DIAGNOSIS — F4481 Dissociative identity disorder: Secondary | ICD-10-CM

## 2012-01-30 ENCOUNTER — Ambulatory Visit: Payer: BC Managed Care – PPO | Admitting: Psychology

## 2012-02-01 ENCOUNTER — Ambulatory Visit (INDEPENDENT_AMBULATORY_CARE_PROVIDER_SITE_OTHER): Payer: BC Managed Care – PPO | Admitting: Psychology

## 2012-02-01 DIAGNOSIS — F4481 Dissociative identity disorder: Secondary | ICD-10-CM

## 2012-02-01 DIAGNOSIS — F4310 Post-traumatic stress disorder, unspecified: Secondary | ICD-10-CM

## 2012-02-05 ENCOUNTER — Encounter: Payer: Self-pay | Admitting: Family Medicine

## 2012-02-05 ENCOUNTER — Ambulatory Visit (INDEPENDENT_AMBULATORY_CARE_PROVIDER_SITE_OTHER): Payer: BC Managed Care – PPO | Admitting: Family Medicine

## 2012-02-05 VITALS — BP 118/78 | HR 118 | Temp 98.4°F | Wt 258.0 lb

## 2012-02-05 DIAGNOSIS — F32A Depression, unspecified: Secondary | ICD-10-CM

## 2012-02-05 DIAGNOSIS — E785 Hyperlipidemia, unspecified: Secondary | ICD-10-CM

## 2012-02-05 DIAGNOSIS — I10 Essential (primary) hypertension: Secondary | ICD-10-CM

## 2012-02-05 DIAGNOSIS — E039 Hypothyroidism, unspecified: Secondary | ICD-10-CM

## 2012-02-05 DIAGNOSIS — E119 Type 2 diabetes mellitus without complications: Secondary | ICD-10-CM

## 2012-02-05 DIAGNOSIS — J45909 Unspecified asthma, uncomplicated: Secondary | ICD-10-CM

## 2012-02-05 DIAGNOSIS — F3289 Other specified depressive episodes: Secondary | ICD-10-CM

## 2012-02-05 DIAGNOSIS — F329 Major depressive disorder, single episode, unspecified: Secondary | ICD-10-CM

## 2012-02-05 LAB — CBC WITH DIFFERENTIAL/PLATELET
Basophils Absolute: 0.1 K/uL (ref 0.0–0.1)
Basophils Relative: 0.8 % (ref 0.0–3.0)
Eosinophils Absolute: 0.2 K/uL (ref 0.0–0.7)
Eosinophils Relative: 2.3 % (ref 0.0–5.0)
HCT: 39.7 % (ref 36.0–46.0)
Hemoglobin: 13.3 g/dL (ref 12.0–15.0)
Lymphocytes Relative: 27.4 % (ref 12.0–46.0)
Lymphs Abs: 2.2 K/uL (ref 0.7–4.0)
MCHC: 33.4 g/dL (ref 30.0–36.0)
MCV: 94.5 fl (ref 78.0–100.0)
Monocytes Absolute: 0.6 K/uL (ref 0.1–1.0)
Monocytes Relative: 7.2 % (ref 3.0–12.0)
Neutro Abs: 5.1 K/uL (ref 1.4–7.7)
Neutrophils Relative %: 62.3 % (ref 43.0–77.0)
Platelets: 204 K/uL (ref 150.0–400.0)
RBC: 4.21 Mil/uL (ref 3.87–5.11)
RDW: 13.4 % (ref 11.5–14.6)
WBC: 8.2 K/uL (ref 4.5–10.5)

## 2012-02-05 LAB — HEPATIC FUNCTION PANEL
ALT: 125 U/L — ABNORMAL HIGH (ref 0–35)
AST: 105 U/L — ABNORMAL HIGH (ref 0–37)
Albumin: 4.3 g/dL (ref 3.5–5.2)
Alkaline Phosphatase: 41 U/L (ref 39–117)

## 2012-02-05 LAB — POCT URINALYSIS DIPSTICK
Bilirubin, UA: NEGATIVE
Blood, UA: NEGATIVE
Nitrite, UA: NEGATIVE
Protein, UA: NEGATIVE
pH, UA: 5.5

## 2012-02-05 LAB — BASIC METABOLIC PANEL WITH GFR
BUN: 13 mg/dL (ref 6–23)
CO2: 23 meq/L (ref 19–32)
Calcium: 9.6 mg/dL (ref 8.4–10.5)
Chloride: 106 meq/L (ref 96–112)
Creatinine, Ser: 0.8 mg/dL (ref 0.4–1.2)
GFR: 84.18 mL/min
Glucose, Bld: 145 mg/dL — ABNORMAL HIGH (ref 70–99)
Potassium: 5.1 meq/L (ref 3.5–5.1)
Sodium: 141 meq/L (ref 135–145)

## 2012-02-05 LAB — LIPID PANEL
Cholesterol: 252 mg/dL — ABNORMAL HIGH (ref 0–200)
HDL: 53.3 mg/dL
Total CHOL/HDL Ratio: 5
Triglycerides: 288 mg/dL — ABNORMAL HIGH (ref 0.0–149.0)
VLDL: 57.6 mg/dL — ABNORMAL HIGH (ref 0.0–40.0)

## 2012-02-05 LAB — LDL CHOLESTEROL, DIRECT: Direct LDL: 160.9 mg/dL

## 2012-02-05 NOTE — Progress Notes (Signed)
  Subjective:    Patient ID: Miranda [name], female    DOB: [birth year], 36 y.o.   MRN: [id number]  HPI Here for follow up. She feels well. Her glucoses range from 120 to 160 most of the time. She has not seen Dr. Leslie Dales for over a year.    Review of Systems  Constitutional: Negative.   Respiratory: Negative.   Cardiovascular: Negative.   Psychiatric/Behavioral: Negative.        Objective:   Physical Exam  Constitutional: She appears well-developed and well-nourished.  Cardiovascular: Normal rate, regular rhythm, normal heart sounds and intact distal pulses.   Pulmonary/Chest: Effort normal and breath sounds normal.  Psychiatric: She has a normal mood and affect. Her behavior is normal. Thought content normal.          Assessment & Plan:  Doing well. Get fasting labs. She needs to see Dr. Leslie Dales soon

## 2012-02-06 ENCOUNTER — Ambulatory Visit: Payer: BC Managed Care – PPO | Admitting: Psychology

## 2012-02-08 ENCOUNTER — Ambulatory Visit: Payer: BC Managed Care – PPO | Admitting: Psychology

## 2012-02-08 ENCOUNTER — Telehealth: Payer: Self-pay | Admitting: Family Medicine

## 2012-02-08 NOTE — Telephone Encounter (Signed)
Pt called req to get lab results and also wants to know if she can get refills of Synthroid . and Humalog Insulin [facility] [address].

## 2012-02-12 ENCOUNTER — Encounter: Payer: Self-pay | Admitting: Family Medicine

## 2012-02-12 MED ORDER — ATORVASTATIN CALCIUM 20 MG PO TABS: 20.0000 mg | ORAL_TABLET | Freq: Every day | ORAL | Status: DC

## 2012-02-12 NOTE — Telephone Encounter (Signed)
What is the insulin name & dosage?

## 2012-02-12 NOTE — Progress Notes (Signed)
Quick Note:  I tried to reach pt by phone, no answer. I sent script e-scribe for Lipitor 20 mg and also put a copy of results in mail. ______

## 2012-02-12 NOTE — Progress Notes (Signed)
Addended by: Aniceto Boss A on: 02/12/2012 12:20 PM   Modules accepted: Orders

## 2012-02-13 NOTE — Telephone Encounter (Signed)
Spoke with pt

## 2012-02-13 NOTE — Telephone Encounter (Signed)
She sees Dr. Leslie Dales for her diabetes and thyroid care, so she needs to ask him for these meds

## 2012-02-19 ENCOUNTER — Telehealth: Payer: Self-pay | Admitting: Family Medicine

## 2012-02-19 NOTE — Telephone Encounter (Signed)
Patient called stating that she never received a call with her lab results and she would also like to habe her lab results faxed to  [facility] Attn:Janet [phone]. Please assist.

## 2012-02-19 NOTE — Telephone Encounter (Signed)
I spoke with pt and gave lab results and faxed a copy to the below number. ( lab results were sent in mail to pt on 02/12/12 )

## 2012-03-20 ENCOUNTER — Telehealth: Payer: Self-pay | Admitting: Family Medicine

## 2012-03-20 DIAGNOSIS — E039 Hypothyroidism, unspecified: Secondary | ICD-10-CM

## 2012-03-20 DIAGNOSIS — E119 Type 2 diabetes mellitus without complications: Secondary | ICD-10-CM

## 2012-03-20 NOTE — Telephone Encounter (Signed)
Pt would like to be referred to and Endocrinologist, Dr. Sharl Ma. Please contact

## 2012-03-21 NOTE — Telephone Encounter (Signed)
I spoke with pt  

## 2012-03-21 NOTE — Telephone Encounter (Signed)
The referral was done  

## 2012-04-11 ENCOUNTER — Other Ambulatory Visit: Payer: BC Managed Care – PPO

## 2012-04-11 ENCOUNTER — Ambulatory Visit (INDEPENDENT_AMBULATORY_CARE_PROVIDER_SITE_OTHER): Payer: BC Managed Care – PPO | Admitting: Endocrinology

## 2012-04-11 ENCOUNTER — Other Ambulatory Visit (INDEPENDENT_AMBULATORY_CARE_PROVIDER_SITE_OTHER): Payer: BC Managed Care – PPO

## 2012-04-11 ENCOUNTER — Encounter: Payer: Self-pay | Admitting: Endocrinology

## 2012-04-11 VITALS — BP 128/68 | HR 111 | Temp 98.1°F | Ht 62.0 in | Wt 261.0 lb

## 2012-04-11 DIAGNOSIS — E282 Polycystic ovarian syndrome: Secondary | ICD-10-CM | POA: Insufficient documentation

## 2012-04-11 DIAGNOSIS — K7689 Other specified diseases of liver: Secondary | ICD-10-CM

## 2012-04-11 DIAGNOSIS — E119 Type 2 diabetes mellitus without complications: Secondary | ICD-10-CM

## 2012-04-11 DIAGNOSIS — E785 Hyperlipidemia, unspecified: Secondary | ICD-10-CM

## 2012-04-11 LAB — LIPID PANEL
Cholesterol: 112 mg/dL (ref 0–200)
HDL: 50.5 mg/dL (ref >39.00)
LDL Cholesterol: 32 mg/dL (ref 0–99)
VLDL: 29.2 mg/dL (ref 0.0–40.0)

## 2012-04-11 LAB — HEPATIC FUNCTION PANEL
ALT: 123 U/L — ABNORMAL HIGH (ref 0–35)
Total Bilirubin: 0.7 mg/dL (ref 0.3–1.2)
Total Protein: 7.1 g/dL (ref 6.0–8.3)

## 2012-04-11 LAB — BASIC METABOLIC PANEL
CO2: 24 mEq/L (ref 19–32)
Chloride: 106 mEq/L (ref 96–112)
Potassium: 4.1 mEq/L (ref 3.5–5.1)
Sodium: 140 mEq/L (ref 135–145)

## 2012-04-11 LAB — HEMOGLOBIN A1C: Hgb A1c MFr Bld: 8.5 % — ABNORMAL HIGH (ref 4.6–6.5)

## 2012-04-11 MED ORDER — INSULIN LISPRO 100 UNIT/ML ~~LOC~~ SOLN: SUBCUTANEOUS | Status: DC

## 2012-04-11 MED ORDER — INSULIN PUMP SYRINGE RESERVOIR MISC: 1.0000 | Freq: Three times a day (TID) | Status: DC | PRN

## 2012-04-11 NOTE — Progress Notes (Signed)
Subjective:    Patient ID: Miranda [name], female    DOB: October 23, 1976, 36 y.o.   MRN: [id number]  HPI pt states 8 years h/o dm.  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  he has been on insulin x 6 years, and pump rx since 2010.  She has been off pump rx x a few mos, and cbg's are worse since then.  pt says his diet and exercise are "ok."  Pt states few years of intermittent slight numbness of the feet, but no assoc pain.  Past Medical History  Diagnosis Date  . Hypothyroidism     sees Dr. Leslie Dales  . Diabetes mellitus     type 2, on insulin pump, sees Dr. Leslie Dales  . GERD (gastroesophageal reflux disease)   . Hyperlipidemia   . Hypertension   . Migraine syndrome   . Neuropathy associated with endocrine disorder   . Asthma   . Neck pain   . Depression   . Insomnia     No past surgical history on file.  History   Social History  . Marital Status: Married    Spouse Name: N/A    Number of Children: N/A  . Years of Education: N/A   Occupational History  . Not on file.   Social History Main Topics  . Smoking status: Current Everyday Smoker -- 0.5 packs/day    Types: Cigarettes  . Smokeless tobacco: Never Used   Comment: less than  . Alcohol Use: No  . Drug Use: No  . Sexually Active: Not on file   Other Topics Concern  . Not on file   Social History Narrative  . No narrative on file    Current Outpatient Prescriptions on File Prior to Visit  Medication Sig Dispense Refill  . atorvastatin (LIPITOR) 20 MG tablet Take 1 tablet (20 mg total) by mouth daily.  30 tablet  11  . buPROPion (WELLBUTRIN SR) 150 MG 12 hr tablet Take 150 mg by mouth daily. Take 300 mg every morning       . cetirizine (ZYRTEC) 10 MG tablet Take 10 mg by mouth daily.        . cyclobenzaprine (FLEXERIL) 10 MG tablet Take 10 mg by mouth as needed.        . cycloSPORINE (RESTASIS) 0.05 % ophthalmic emulsion 1 drop 2 (two) times daily.      . hydrOXYzine (ATARAX) 50 MG tablet Take  50 mg by mouth at bedtime as needed.        . hyoscyamine (LEVSIN) 0.125 MG/5ML ELIX Take 0.125 mg by mouth as needed.        . Insulin Human (INSULIN PUMP) 100 unit/ml SOLN Inject into the skin. humalog       . levothyroxine (SYNTHROID, LEVOTHROID) 125 MCG tablet Take 125 mcg by mouth daily.        . metFORMIN (GLUCOPHAGE) 1000 MG tablet Take 1,000 mg by mouth 2 (two) times daily with a meal.        . metoprolol succinate (TOPROL-XL) 25 MG 24 hr tablet 25 mg daily. Take 1/2 tablet every day      . metoprolol succinate (TOPROL-XL) 25 MG 24 hr tablet TAKE 1 TABLET BY MOUTH EVERY DAY  30 tablet  11  . Multiple Vitamin (MULTIVITAMIN) tablet Take 1 tablet by mouth daily.        Marland Kitchen omeprazole (PRILOSEC) 20 MG capsule TAKE ONE CAPSULE BY MOUTH TWICE A DAY  60 capsule  11  .  pioglitazone (ACTOS) 45 MG tablet Take 45 mg by mouth daily.        . potassium chloride SA (KLOR-CON M20) 20 MEQ tablet Take 1 tablet (20 mEq total) by mouth daily.  30 tablet  3  . prazosin (MINIPRESS) 5 MG capsule Take 5 mg by mouth at bedtime. Take 2      . promethazine (PHENERGAN) 25 MG tablet Take 25 mg by mouth as needed.        . risperiDONE (RISPERDAL) 2 MG tablet Take 2 mg by mouth 2 (two) times daily.       . sertraline (ZOLOFT) 100 MG tablet Take 100 mg by mouth daily. Take 2 every day      . sitaGLIPtin (JANUVIA) 100 MG tablet Take 100 mg by mouth daily.        . SUMAtriptan (IMITREX) 100 MG tablet Take 100 mg by mouth as needed.        . SYMBICORT 160-4.5 MCG/ACT inhaler INHALE 2 PUFFS TWICE A DAY  10.2 g  2  . traZODone (DESYREL) 100 MG tablet Take 100 mg by mouth daily as needed.       . zolpidem (AMBIEN CR) 12.5 MG CR tablet Take 12.5 mg by mouth at bedtime as needed.          Allergies  Allergen Reactions  . Exenatide     hives  . Hydrocodone-Acetaminophen   . Penicillins   . Tramadol Hcl     No family history on file. adopted There were no vitals taken for this visit.  Review of Systems denies  blurry vision, headache, chest pain, n/v, urinary frequency, cramps, rhinorrhea, and easy bruising.  She has slight rash of her face.  She report weight gain, irreg menses, difficulty with concentration, excessive diaphoresis, and doe.  Depression is improved recently.      Objective:   Physical Exam VS: see vs page GEN: no distress. obese HEAD: head: no deformity eyes: no periorbital swelling, no proptosis external nose and ears are normal mouth: no lesion seen NECK: supple, thyroid is not enlarged CHEST WALL: no deformity LUNGS:  Clear to auscultation CV: reg rate and rhythm, no murmur ABD: abdomen is soft, nontender.  no hepatosplenomegaly.  not distended.  no hernia.  Insulin injection sites at the anterior abdomen are normal MUSCULOSKELETAL: muscle bulk and strength are grossly normal.  no obvious joint swelling.  gait is normal and steady EXTEMITIES: no deformity.  no ulcer on the feet.  feet are of normal color and temp.  no edema.  There is bilateral onychomycosis PULSES: dorsalis pedis intact bilat.  no carotid bruit NEURO:  cn 2-12 grossly intact.   readily moves all 4's.  sensation is intact to touch on the feet SKIN:  Normal texture and temperature.  No rash or suspicious lesion is visible.  Moderate hirsutism on the face NODES:  None palpable at the neck PSYCH: alert, oriented x3.  Does not appear anxious nor depressed.   (pt says a1c was 6.4; 2 mos ago)    Assessment & Plan:  DM.  Control is apparently worse since pump rx was stopped. Depression.  This complicates the rx of DM. Neuropathy, due to DM.

## 2012-04-11 NOTE — Patient Instructions (Addendum)
good diet and exercise habits significanly improve the control of your diabetes.  please let me know if you wish to be referred to a dietician, or for weight-loss surgery.  high blood sugar is very risky to your health.  you should see an eye doctor every year. controlling your blood pressure and cholesterol drastically reduces the damage diabetes does to your body.  this also applies to quitting smoking.  please discuss these with your doctor.  you should take an aspirin every day, unless you have been advised by a doctor not to. check your blood sugar 4 times a day--before the 3 meals, and at bedtime.  also check if you have symptoms of your blood sugar being too high or too low.  please keep a record of the readings and bring it to your next appointment here.  please call us sooner if your blood sugar goes below 70, or if it stays over 200. Resume your pump: Please come back for a follow-up appointment in 2 weeks. Please let me know your pump settings then. blood tests are being requested for you today.

## 2012-04-12 ENCOUNTER — Telehealth: Payer: Self-pay | Admitting: *Deleted

## 2012-04-12 NOTE — Telephone Encounter (Signed)
Called pt to inform of lab results, pt informed (letter also mailed to pt). 

## 2012-04-17 ENCOUNTER — Other Ambulatory Visit: Payer: Self-pay | Admitting: Family Medicine

## 2012-04-25 ENCOUNTER — Other Ambulatory Visit: Payer: Self-pay | Admitting: Endocrinology

## 2012-04-29 ENCOUNTER — Encounter: Payer: Self-pay | Admitting: Endocrinology

## 2012-04-29 ENCOUNTER — Telehealth: Payer: Self-pay | Admitting: Endocrinology

## 2012-04-29 ENCOUNTER — Ambulatory Visit (INDEPENDENT_AMBULATORY_CARE_PROVIDER_SITE_OTHER): Payer: BC Managed Care – PPO | Admitting: Endocrinology

## 2012-04-29 VITALS — BP 110/80 | HR 100 | Temp 97.4°F | Ht 61.25 in | Wt 263.0 lb

## 2012-04-29 DIAGNOSIS — E119 Type 2 diabetes mellitus without complications: Secondary | ICD-10-CM

## 2012-04-29 NOTE — Telephone Encounter (Signed)
Forward 20 pages to Dr. Romero Belling for review on 04-29-12 ym

## 2012-04-29 NOTE — Patient Instructions (Addendum)
continue basal rate of 1.5 units/hr, 24 hrs per day. increase mealtime bolus to 1 unit/15 grams carbohydrate. continue correction bolus (which some people call "sensitivity," or "insulin sensitivity ratio," or just "isr") of 1 unit for each 30 by which your glucose exceeds 100. Please come back for a follow-up appointment for 1 month.

## 2012-04-29 NOTE — Progress Notes (Signed)
Subjective:    Patient ID: Miranda [name], female    DOB: 1976/06/25, 36 y.o.   MRN: [id number]  HPI pt returns for f/u of IDDM (dx'ed 2004; complicated by peripheral sensory neuropathy).  no cbg record, but states cbg's are consistently in the 200's and 300's.  She averages a total of approx 45 units/day, via her pump.   Past Medical History  Diagnosis Date  . Hypothyroidism     sees Dr. Leslie Dales  . Diabetes mellitus     type 2, on insulin pump, sees Dr. Leslie Dales  . GERD (gastroesophageal reflux disease)   . Hyperlipidemia   . Hypertension   . Migraine syndrome   . Neuropathy associated with endocrine disorder   . Asthma   . Neck pain   . Depression   . Insomnia     No past surgical history on file.  History   Social History  . Marital Status: Married    Spouse Name: N/A    Number of Children: N/A  . Years of Education: N/A   Occupational History  . Not on file.   Social History Main Topics  . Smoking status: Former Smoker -- 0.5 packs/day    Types: Cigarettes    Quit date: 02/28/2012  . Smokeless tobacco: Never Used   Comment: less than  . Alcohol Use: No  . Drug Use: No  . Sexually Active: Not on file   Other Topics Concern  . Not on file   Social History Narrative  . No narrative on file    Current Outpatient Prescriptions on File Prior to Visit  Medication Sig Dispense Refill  . buPROPion (WELLBUTRIN SR) 150 MG 12 hr tablet Take 150 mg by mouth daily. Take 300 mg every morning       . cetirizine (ZYRTEC) 10 MG tablet Take 10 mg by mouth daily.        . cyclobenzaprine (FLEXERIL) 10 MG tablet Take 10 mg by mouth as needed.        . cycloSPORINE (RESTASIS) 0.05 % ophthalmic emulsion 1 drop 2 (two) times daily.      Marland Kitchen glucose blood (ONE TOUCH ULTRA TEST) test strip Use as directed to test blood sugar four times daily  120 each  6  . hydrOXYzine (ATARAX) 50 MG tablet Take 50 mg by mouth at bedtime as needed.        . hyoscyamine (LEVSIN) 0.125 MG/5ML  ELIX Take 0.125 mg by mouth as needed.        . Insulin Infusion Pump (ONETOUCH PING INSULIN PUMP) KIT by Does not apply route. One device      . Insulin Infusion Pump Supplies (INSULIN PUMP SYRINGE RESERVOIR) MISC 1 Device by Does not apply route 3 (three) times daily with meals as needed.  30 each  3  . KLOR-CON M20 20 MEQ tablet TAKE 1 TABLET EVERY DAY (NEED APPT)  30 tablet  11  . levothyroxine (SYNTHROID, LEVOTHROID) 125 MCG tablet Take 125 mcg by mouth daily.        . metFORMIN (GLUCOPHAGE) 1000 MG tablet Take 1,000 mg by mouth 2 (two) times daily with a meal.        . Multiple Vitamin (MULTIVITAMIN) tablet Take 1 tablet by mouth daily.        Marland Kitchen omeprazole (PRILOSEC) 20 MG capsule       . pioglitazone (ACTOS) 45 MG tablet Take 45 mg by mouth daily.        . prazosin (MINIPRESS)  5 MG capsule Take 5 mg by mouth at bedtime. Take 2      . promethazine (PHENERGAN) 25 MG tablet Take 25 mg by mouth as needed.        . risperiDONE (RISPERDAL) 2 MG tablet Take 2 mg by mouth 2 (two) times daily.       . rosuvastatin (CRESTOR) 40 MG tablet Take 40 mg by mouth daily.      . sertraline (ZOLOFT) 100 MG tablet Take 100 mg by mouth daily. Take 2 every day      . sitaGLIPtin (JANUVIA) 100 MG tablet Take 100 mg by mouth daily.        . SUMAtriptan (IMITREX) 100 MG tablet Take 100 mg by mouth as needed.        . SYMBICORT 160-4.5 MCG/ACT inhaler INHALE 2 PUFFS TWICE A DAY  10.2 g  2  . zolpidem (AMBIEN) 10 MG tablet Take by mouth at bedtime as needed. 1/3 tablet as needed      . DISCONTD: insulin lispro (HUMALOG) 100 UNIT/ML injection As directed for insulin pump, total 40 units/day  20 mL  12  . DISCONTD: metoprolol succinate (TOPROL-XL) 25 MG 24 hr tablet TAKE 1 TABLET BY MOUTH EVERY DAY  30 tablet  11  . DISCONTD: potassium chloride SA (KLOR-CON M20) 20 MEQ tablet Take 1 tablet (20 mEq total) by mouth daily.  30 tablet  3    Allergies  Allergen Reactions  . Exenatide     hives  . Lortab  (Hydrocodone-Acetaminophen)   . Penicillins   . Tramadol Hcl     No family history on file.  BP 110/80  Pulse 100  Temp 97.4 F (36.3 C) (Oral)  Ht 5' 1.25" (1.556 m)  Wt 263 lb (119.296 kg)  BMI 49.29 kg/m2  SpO2 97%  LMP 04/10/2012  Review of Systems denies hypoglycemia    Objective:   Physical Exam VITAL SIGNS:  See vs page GENERAL: no distress SKIN:  Insulin infusion sites at the anterior abdomen are normal, except for a few ecchymoses     Assessment & Plan:  DM.  needs increased rx.  She is on mostly basal insulin--for that to be her requirement would be unusual

## 2012-05-31 ENCOUNTER — Encounter: Payer: Self-pay | Admitting: Endocrinology

## 2012-05-31 ENCOUNTER — Ambulatory Visit (INDEPENDENT_AMBULATORY_CARE_PROVIDER_SITE_OTHER): Payer: BC Managed Care – PPO | Admitting: Endocrinology

## 2012-05-31 VITALS — BP 108/72 | HR 78 | Temp 97.2°F | Ht 61.5 in | Wt 268.0 lb

## 2012-05-31 DIAGNOSIS — E119 Type 2 diabetes mellitus without complications: Secondary | ICD-10-CM

## 2012-05-31 NOTE — Patient Instructions (Addendum)
reduce basal rate to 1.2 units/hr, 24 hrs per day. increase mealtime bolus to 1 unit/12 grams carbohydrate. continue correction bolus (which some people call "sensitivity," or "insulin sensitivity ratio," or just "isr") of 1 unit for each 30 by which your glucose exceeds 100. Please come back for a follow-up appointment in 6 weeks.   Stop [place].

## 2012-05-31 NOTE — Progress Notes (Signed)
Subjective:    Patient ID: Miranda [name], female    DOB: 23-Feb-1976, 36 y.o.   MRN: [id number]  HPI pt returns for f/u of IDDM (dx'ed 2004; complicated by peripheral sensory neuropathy).  no cbg record, but states cbg's are improved since the last ov.  It varies from 150-200.   It is lowest if lunch is delayed.  She still averages a total of approx 45 units/day, via her pump.   Past Medical History  Diagnosis Date  . Hypothyroidism     sees Dr. Leslie Dales  . Diabetes mellitus     type 2, on insulin pump, sees Dr. Leslie Dales  . GERD (gastroesophageal reflux disease)   . Hyperlipidemia   . Hypertension   . Migraine syndrome   . Neuropathy associated with endocrine disorder   . Asthma   . Neck pain   . Depression   . Insomnia     No past surgical history on file.  History   Social History  . Marital Status: Married    Spouse Name: N/A    Number of Children: N/A  . Years of Education: N/A   Occupational History  . Not on file.   Social History Main Topics  . Smoking status: Former Smoker -- 0.5 packs/day    Types: Cigarettes    Quit date: 02/28/2012  . Smokeless tobacco: Never Used   Comment: less than  . Alcohol Use: No  . Drug Use: No  . Sexually Active: Not on file   Other Topics Concern  . Not on file   Social History Narrative  . No narrative on file    Current Outpatient Prescriptions on File Prior to Visit  Medication Sig Dispense Refill  . buPROPion (WELLBUTRIN SR) 150 MG 12 hr tablet Take 150 mg by mouth daily. Take 300 mg every morning       . cetirizine (ZYRTEC) 10 MG tablet Take 10 mg by mouth daily.        . cyclobenzaprine (FLEXERIL) 10 MG tablet Take 10 mg by mouth as needed.        . cycloSPORINE (RESTASIS) 0.05 % ophthalmic emulsion 1 drop 2 (two) times daily.      Marland Kitchen glucose blood (ONE TOUCH ULTRA TEST) test strip Use as directed to test blood sugar four times daily  120 each  6  . hydrOXYzine (ATARAX) 50 MG tablet Take 50 mg by mouth at  bedtime as needed.        . hyoscyamine (LEVSIN) 0.125 MG/5ML ELIX Take 0.125 mg by mouth as needed.        . Insulin Infusion Pump (ONETOUCH PING INSULIN PUMP) KIT by Does not apply route. One device      . Insulin Infusion Pump Supplies (INSET INFUSION SET 23" ) MISC 1 Device by Does not apply route every 3 (three) days.      . Insulin Infusion Pump Supplies (INSULIN PUMP SYRINGE RESERVOIR) MISC 1 Device by Does not apply route 3 (three) times daily with meals as needed.  30 each  3  . insulin lispro (HUMALOG) 100 UNIT/ML injection As directed for insulin pump, total 50 units/day      . KLOR-CON M20 20 MEQ tablet TAKE 1 TABLET EVERY DAY (NEED APPT)  30 tablet  11  . levothyroxine (SYNTHROID, LEVOTHROID) 125 MCG tablet Take 125 mcg by mouth daily.        . metFORMIN (GLUCOPHAGE) 1000 MG tablet Take 1,000 mg by mouth 2 (two) times daily with  a meal.        . metoprolol succinate (TOPROL-XL) 25 MG 24 hr tablet       . Multiple Vitamin (MULTIVITAMIN) tablet Take 1 tablet by mouth daily.        Marland Kitchen omeprazole (PRILOSEC) 20 MG capsule Take 20 mg by mouth daily.       . pioglitazone (ACTOS) 45 MG tablet Take 45 mg by mouth daily.        . prazosin (MINIPRESS) 5 MG capsule Take 5 mg by mouth at bedtime. Take 2      . promethazine (PHENERGAN) 25 MG tablet Take 25 mg by mouth as needed.        . risperiDONE (RISPERDAL) 2 MG tablet Take 2 mg by mouth 2 (two) times daily.       . rosuvastatin (CRESTOR) 40 MG tablet Take 40 mg by mouth daily.      . sertraline (ZOLOFT) 100 MG tablet Take 100 mg by mouth daily. Take 2 every day      . SUMAtriptan (IMITREX) 100 MG tablet Take 100 mg by mouth as needed.        . SYMBICORT 160-4.5 MCG/ACT inhaler INHALE 2 PUFFS TWICE A DAY  10.2 g  2  . zolpidem (AMBIEN) 10 MG tablet Take by mouth at bedtime as needed. 1/3 tablet as needed        Allergies  Allergen Reactions  . Exenatide     hives  . Lortab (Hydrocodone-Acetaminophen)   . Penicillins   . Tramadol Hcl      No family history on file.  BP 108/72  Pulse 78  Temp 97.2 F (36.2 C) (Oral)  Ht 5' 1.5" (1.562 m)  Wt 268 lb (121.564 kg)  BMI 49.82 kg/m2  SpO2 96%   Review of Systems denies hypoglycemia    Objective:   Physical Exam VITAL SIGNS:  See vs page GENERAL: no distress.  Morbid obesity.   PSYCH: Alert and oriented x 3.  Does not appear anxious nor depressed.     Assessment & Plan:  DM.  needs increased rx

## 2012-07-12 ENCOUNTER — Other Ambulatory Visit (INDEPENDENT_AMBULATORY_CARE_PROVIDER_SITE_OTHER): Payer: BC Managed Care – PPO

## 2012-07-12 ENCOUNTER — Encounter: Payer: Self-pay | Admitting: Endocrinology

## 2012-07-12 ENCOUNTER — Ambulatory Visit (INDEPENDENT_AMBULATORY_CARE_PROVIDER_SITE_OTHER): Payer: BC Managed Care – PPO | Admitting: Endocrinology

## 2012-07-12 VITALS — BP 110/72 | HR 73 | Temp 97.7°F | Wt 257.0 lb

## 2012-07-12 DIAGNOSIS — E039 Hypothyroidism, unspecified: Secondary | ICD-10-CM

## 2012-07-12 DIAGNOSIS — E119 Type 2 diabetes mellitus without complications: Secondary | ICD-10-CM

## 2012-07-12 LAB — HEMOGLOBIN A1C: Hgb A1c MFr Bld: 7.6 % — ABNORMAL HIGH (ref 4.6–6.5)

## 2012-07-12 LAB — TSH: TSH: 2.88 u[IU]/mL (ref 0.35–5.50)

## 2012-07-12 NOTE — Patient Instructions (Addendum)
continue basal rate of 1.2 units/hr, 24 hrs per day.  continue mealtime bolus of 1 unit/12 grams carbohydrate.  continue correction bolus (which some people call "sensitivity," or "insulin sensitivity ratio," or just "isr") of 1 unit for each 30 by which your glucose exceeds 100. Please come back for a follow-up appointment in 3 months. blood tests are being requested for you today.  You will receive a letter with results. Refer to a diabetes education specialist, to consider a continuous glucose monitor.  you will receive a phone call, about a day and time for an appointment.

## 2012-07-12 NOTE — Progress Notes (Signed)
Subjective:    Patient ID: Miranda [name], female    DOB: 04/28/1976, 36 y.o.   MRN: [id number]  HPI pt returns for f/u of IDDM (dx'ed 2004; complicated by peripheral sensory neuropathy; she has a one-touch ping insulin infusion pump).  no cbg record, but states cbg's are well-controlled.  It is highest in am, and lowest in the afternoon.  She still averages a total of approx 35 units/day, via her pump.  pt states she feels well in general, and her diet is better recently.   Past Medical History  Diagnosis Date  . Hypothyroidism     sees Dr. Leslie Dales  . Diabetes mellitus     type 2, on insulin pump, sees Dr. Leslie Dales  . GERD (gastroesophageal reflux disease)   . Hyperlipidemia   . Hypertension   . Migraine syndrome   . Neuropathy associated with endocrine disorder   . Asthma   . Neck pain   . Depression   . Insomnia     No past surgical history on file.  History   Social History  . Marital Status: Married    Spouse Name: N/A    Number of Children: N/A  . Years of Education: N/A   Occupational History  . Not on file.   Social History Main Topics  . Smoking status: Current Every Day Smoker -- 0.5 packs/day    Types: Cigarettes    Last Attempt to Quit: 02/28/2012  . Smokeless tobacco: Never Used   Comment: less than  . Alcohol Use: No  . Drug Use: No  . Sexually Active: Not on file   Other Topics Concern  . Not on file   Social History Narrative  . No narrative on file    Current Outpatient Prescriptions on File Prior to Visit  Medication Sig Dispense Refill  . buPROPion (WELLBUTRIN SR) 150 MG 12 hr tablet Take 150 mg by mouth daily. Take 300 mg every morning       . cetirizine (ZYRTEC) 10 MG tablet Take 10 mg by mouth daily.        . cyclobenzaprine (FLEXERIL) 10 MG tablet Take 10 mg by mouth as needed.        . cycloSPORINE (RESTASIS) 0.05 % ophthalmic emulsion 1 drop 2 (two) times daily.      Marland Kitchen glucose blood (ONE TOUCH ULTRA TEST) test strip Use as  directed to test blood sugar four times daily  120 each  6  . hydrOXYzine (ATARAX) 50 MG tablet Take 50 mg by mouth at bedtime as needed.        . hyoscyamine (LEVSIN) 0.125 MG/5ML ELIX Take 0.125 mg by mouth as needed.        . Insulin Infusion Pump (ONETOUCH PING INSULIN PUMP) KIT by Does not apply route. One device      . Insulin Infusion Pump Supplies (INSET INFUSION SET 23" ) MISC 1 Device by Does not apply route every 3 (three) days.      . Insulin Infusion Pump Supplies (INSULIN PUMP SYRINGE RESERVOIR) MISC 1 Device by Does not apply route 3 (three) times daily with meals as needed.  30 each  3  . insulin lispro (HUMALOG) 100 UNIT/ML injection As directed for insulin pump, total 50 units/day      . KLOR-CON M20 20 MEQ tablet TAKE 1 TABLET EVERY DAY (NEED APPT)  30 tablet  11  . levothyroxine (SYNTHROID, LEVOTHROID) 125 MCG tablet Take 125 mcg by mouth daily.        Marland Kitchen  metFORMIN (GLUCOPHAGE) 1000 MG tablet Take 1,000 mg by mouth 2 (two) times daily with a meal.        . metoprolol succinate (TOPROL-XL) 25 MG 24 hr tablet       . Multiple Vitamin (MULTIVITAMIN) tablet Take 1 tablet by mouth daily.        Marland Kitchen omeprazole (PRILOSEC) 20 MG capsule Take 20 mg by mouth daily.       . pioglitazone (ACTOS) 45 MG tablet Take 45 mg by mouth daily.        . prazosin (MINIPRESS) 5 MG capsule Take 5 mg by mouth at bedtime. Take 2      . promethazine (PHENERGAN) 25 MG tablet Take 25 mg by mouth as needed.        . risperiDONE (RISPERDAL) 2 MG tablet Take 2 mg by mouth daily.       . rosuvastatin (CRESTOR) 40 MG tablet Take 40 mg by mouth daily.      . sertraline (ZOLOFT) 100 MG tablet Take 100 mg by mouth daily. Take 2 every day      . SUMAtriptan (IMITREX) 100 MG tablet Take 100 mg by mouth as needed.        . SYMBICORT 160-4.5 MCG/ACT inhaler INHALE 2 PUFFS TWICE A DAY  10.2 g  2  . zolpidem (AMBIEN) 10 MG tablet Take by mouth at bedtime as needed. 1/3 tablet as needed        Allergies  Allergen  Reactions  . Exenatide     hives  . Lortab (Hydrocodone-Acetaminophen)   . Penicillins   . Tramadol Hcl     No family history on file.  BP 110/72  Pulse 73  Temp 97.7 F (36.5 C) (Oral)  Wt 257 lb (116.574 kg)  SpO2 96%  Review of Systems denies hypoglycemia    Objective:   Physical Exam VITAL SIGNS:  See vs page GENERAL: no distress Pulses: dorsalis pedis intact bilat.   Feet: no deformity.  no ulcer on the feet.  feet are of normal color and temp.  no edema Neuro: sensation is intact to touch on the feet.   Lab Results  Component Value Date   HGBA1C 7.6* 07/12/2012      Assessment & Plan:  DM; needs increased rx

## 2012-07-25 ENCOUNTER — Ambulatory Visit (INDEPENDENT_AMBULATORY_CARE_PROVIDER_SITE_OTHER): Payer: BC Managed Care – PPO | Admitting: Family Medicine

## 2012-07-25 ENCOUNTER — Encounter: Payer: Self-pay | Admitting: Family Medicine

## 2012-07-25 VITALS — BP 110/70 | HR 125 | Temp 98.5°F | Wt 258.0 lb

## 2012-07-25 DIAGNOSIS — M79609 Pain in unspecified limb: Secondary | ICD-10-CM

## 2012-07-25 DIAGNOSIS — M79673 Pain in unspecified foot: Secondary | ICD-10-CM

## 2012-07-25 NOTE — Progress Notes (Signed)
  Subjective:    Patient ID: Miranda [name], female    DOB: 1976-05-15, 36 y.o.   MRN: [id number]  HPI Here for 4 months of sharp pains in the right foot. These affect the top and the bottom of the lateral foot. No redness or swelling. No hx of trauma.   Review of Systems  Constitutional: Negative.   Musculoskeletal: Positive for arthralgias.       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 Refer to Podiatry

## 2012-08-07 ENCOUNTER — Encounter: Payer: BC Managed Care – PPO | Attending: Endocrinology | Admitting: *Deleted

## 2012-08-07 ENCOUNTER — Encounter: Payer: Self-pay | Admitting: *Deleted

## 2012-08-07 VITALS — Ht 61.5 in | Wt 256.4 lb

## 2012-08-07 DIAGNOSIS — E119 Type 2 diabetes mellitus without complications: Secondary | ICD-10-CM

## 2012-08-07 DIAGNOSIS — Z713 Dietary counseling and surveillance: Secondary | ICD-10-CM | POA: Insufficient documentation

## 2012-08-07 NOTE — Progress Notes (Signed)
CGM Education  Appt start time: 0930 end time:1030.  Assessment:  Primary concerns today: Patient here for information on Continuous Glucose Monitoring.  She states she has had DM2 since 2005 and has been on insulin pump for past 3-4 years. She demonstrated adequate understanding of Carb Counting, Carb and Correction Ratios, hyper and hypoglycemia and insulin action.  She has concerns regarding hypoglycemia unawareness.   Medications: see list. Currently on [employer]imas Ping insulin pump     Intervention: As patient is on Animas Pump, she would need a separate CGM product. Discussed potential for Dexcom or Medtronic Guardian CGM products  Discussed the following points: Understanding Glucose Sensing vs BG data Potential Sensor Settings-  High Glucose:and Low Glucose Alerts  Predictive Alerts  Rate Change Alerts Sensor insertion info Use of Product  Entering BG, Calibration schedule, and Graphs with [employer] and Reports CGM packet provided: Medtronic per patient request  Follow Up Patient prefers Medtronic option , plans to contact Medtr[employer] Rep to initiate assessment of insurance coverage. If ordered, she will contact me for training appointments as needed.

## 2012-08-07 NOTE — Patient Instructions (Signed)
Plan: Review websites for both CGM companies; Medtronic Guardian and the [employer] of your choice to initiate process and cost information If you decide to order a CGM product, contact this office for training options.

## 2012-08-08 ENCOUNTER — Telehealth: Payer: Self-pay | Admitting: Endocrinology

## 2012-08-08 ENCOUNTER — Other Ambulatory Visit (INDEPENDENT_AMBULATORY_CARE_PROVIDER_SITE_OTHER): Payer: BC Managed Care – PPO

## 2012-08-08 ENCOUNTER — Other Ambulatory Visit: Payer: Self-pay | Admitting: Endocrinology

## 2012-08-08 DIAGNOSIS — E119 Type 2 diabetes mellitus without complications: Secondary | ICD-10-CM

## 2012-08-08 LAB — HEMOGLOBIN A1C: Hgb A1c MFr Bld: 7.9 % — ABNORMAL HIGH (ref 4.6–6.5)

## 2012-08-08 NOTE — Telephone Encounter (Signed)
Placed a A1c lab for Pt advised to have labs drawn.

## 2012-08-08 NOTE — Telephone Encounter (Signed)
Called Pt for clarification. Per [employer] a continuous glucose monitor is only covered with Type 1 diabetes. Pt stated this is the type you recommended. Pt also states she can bring in the papers or come for an appt if she needs further evaluations. Please advise. Call pt back at [phone] (H)

## 2012-08-08 NOTE — Telephone Encounter (Signed)
Pt was advised at previous fu appt to get a glucose monitor, per Dr. Everardo All. Pt is in process of getting monitor, but she needs information about her diagnosis and possibly written documents stating that the monitor is medically necessary. Does she need an appointment? Please advise.

## 2012-08-08 NOTE — Telephone Encounter (Signed)
i would advise Miranda [name] to have labs drawn, to prove to [facility] that Miranda [name] has type 1 DM

## 2012-09-01 ENCOUNTER — Other Ambulatory Visit: Payer: Self-pay | Admitting: Family Medicine

## 2012-09-02 ENCOUNTER — Ambulatory Visit (INDEPENDENT_AMBULATORY_CARE_PROVIDER_SITE_OTHER): Payer: BC Managed Care – PPO | Admitting: Family Medicine

## 2012-09-02 ENCOUNTER — Encounter: Payer: Self-pay | Admitting: Family Medicine

## 2012-09-02 ENCOUNTER — Telehealth: Payer: Self-pay | Admitting: Family Medicine

## 2012-09-02 VITALS — BP 118/74 | HR 140 | Temp 98.6°F

## 2012-09-02 DIAGNOSIS — G43909 Migraine, unspecified, not intractable, without status migrainosus: Secondary | ICD-10-CM

## 2012-09-02 MED ORDER — MEPERIDINE HCL 100 MG/ML IJ SOLN
100.0000 mg | Freq: Once | INTRAMUSCULAR | Status: AC
  Administered 2012-09-02: 100 mg via INTRAMUSCULAR

## 2012-09-02 MED ORDER — PROMETHAZINE HCL 50 MG/ML IJ SOLN
50.0000 mg | Freq: Once | INTRAMUSCULAR | Status: AC
  Administered 2012-09-02: 50 mg via INTRAMUSCULAR

## 2012-09-02 MED ORDER — OXYCODONE-ACETAMINOPHEN 7.5-500 MG PO TABS: 1.0000 | ORAL_TABLET | Freq: Four times a day (QID) | ORAL | Status: DC | PRN

## 2012-09-02 NOTE — Addendum Note (Signed)
Addended by: Aniceto Boss A on: 09/02/2012 05:25 PM   Modules accepted: Orders

## 2012-09-02 NOTE — Progress Notes (Signed)
  Subjective:    Patient ID: Miranda [name], female    DOB: 1976/03/14, 36 y.o.   MRN: [id number]  HPI Here with her friend who drove her here for a migraine that began this am when he woke up. This is centered in the back of her head, which is typical for her migraines. This has not responded at all to Sumatriptan or Percocet. She is nauseated but has not vomited. No vision changes.    Review of Systems  Constitutional: Negative.   Respiratory: Negative.   Cardiovascular: Negative.   Neurological: Positive for headaches. Negative for dizziness, tremors, seizures, syncope, facial asymmetry, speech difficulty, weakness, light-headedness and numbness.       Objective:   Physical Exam  Constitutional: She is oriented to person, place, and time.       In pain, photophobic   Eyes: Conjunctivae normal and EOM are normal. Pupils are equal, round, and reactive to light.  Neurological: She is alert and oriented to person, place, and time. She has normal reflexes. No cranial nerve deficit. She exhibits normal muscle tone. Coordination normal.          Assessment & Plan:  Migraine. Given a work note for today

## 2012-09-02 NOTE — Telephone Encounter (Signed)
Caller: Leitha/Patient; Patient Name: Miranda [name], Miranda [name]; PCP: Fry, Stephen (Family Practice); Best Callback Phone Number: [phone]; Reason for call: migraine headache. Onset 09/02/12. Has taken Sumatriptan twice, Oxycodone twice, Flexeril, Ibuprofen, and Decongestant. Nothing has relieved the pain. Reports pain to the back of her head down into her shoulders. Triaged per Headache guideline, disposition: see provider within 4 hours for "New onset of severe headache unrelieved with 4 hours of home care." Appointment scheduled with Dr. Fry 09/02/12 at 3:45pm. Advised patient to have someone else drive her to the appointment. Patient reports she has someone that will pick her up and take her.  °

## 2012-09-02 NOTE — Telephone Encounter (Signed)
Caller: Erienne/Patient; Patient Name: Miranda [name]; PCP: Gershon Cr[facility]); Best Callback Phone Number: [phone]; Reason for call: migraine headache. Onset 09/02/12. Has taken Sumatriptan twice, Oxycodone twice, Flexeril, Ibuprofen, and Decongestant. Nothing has relieved the pain. Reports pain to the back of her head down into her shoulders. Triaged per Headache guideline, disposition: see provider within 4 hours for "New onset of severe headache unrelieved with 4 hours of home care." Appointment scheduled with Dr. Clent Ridges 09/02/12 at 3:45pm. Advised patient to have someone else drive her to the appointment. Patient reports she has someone that will pick her up and take her.

## 2012-09-17 ENCOUNTER — Telehealth: Payer: Self-pay | Admitting: Family Medicine

## 2012-09-17 ENCOUNTER — Encounter: Payer: Self-pay | Admitting: Family Medicine

## 2012-09-17 ENCOUNTER — Ambulatory Visit (INDEPENDENT_AMBULATORY_CARE_PROVIDER_SITE_OTHER): Payer: BC Managed Care – PPO | Admitting: Family Medicine

## 2012-09-17 VITALS — BP 120/72 | HR 110 | Temp 98.9°F

## 2012-09-17 DIAGNOSIS — G43909 Migraine, unspecified, not intractable, without status migrainosus: Secondary | ICD-10-CM

## 2012-09-17 MED ORDER — PROMETHAZINE HCL 50 MG/ML IJ SOLN
50.0000 mg | Freq: Four times a day (QID) | INTRAMUSCULAR | Status: DC | PRN
  Administered 2012-09-17: 50 mg via INTRAMUSCULAR

## 2012-09-17 MED ORDER — TOPIRAMATE 50 MG PO TABS: 100.0000 mg | ORAL_TABLET | Freq: Every day | ORAL | Status: DC

## 2012-09-17 MED ORDER — MEPERIDINE HCL 100 MG/ML IJ SOLN
100.0000 mg | Freq: Once | INTRAMUSCULAR | Status: AC
  Administered 2012-09-17: 100 mg via INTRAMUSCULAR

## 2012-09-17 MED ORDER — RIZATRIPTAN BENZOATE 10 MG PO TABS: 10.0000 mg | ORAL_TABLET | ORAL | Status: DC | PRN

## 2012-09-17 NOTE — Telephone Encounter (Signed)
Please see note below. Pt refused OV, would like diff med for migraines. Please advise.

## 2012-09-17 NOTE — Progress Notes (Signed)
  Subjective:    Patient ID: Miranda [name], female    DOB: 21-Mar-1976, 36 y.o.   MRN: [id number]  HPI Here with another cycle of 3 days of migraines. She was here several weeks ago for a Demerol shot and this worked well. Now she has been taking Imitrex with no response.    Review of Systems  Constitutional: Negative.   Neurological: Positive for headaches.       Objective:   Physical Exam  Constitutional: She is oriented to person, place, and time. She appears well-developed and well-nourished.       In pain, photophobic  HENT:  Head: Normocephalic and atraumatic.  Eyes: Pupils are equal, round, and reactive to light.  Neck: Neck supple. No thyromegaly present.  Lymphadenopathy:    She has no cervical adenopathy.  Neurological: She is alert and oriented to person, place, and time.          Assessment & Plan:  Given another Demerol shot. Try Maxalt for an acute migraine. Increase Topamax to 100 mg a day to prophylax against migraines.

## 2012-09-17 NOTE — Telephone Encounter (Signed)
Patient called back. Appt made for 2pm today.

## 2012-09-17 NOTE — Telephone Encounter (Signed)
Patient Information:  Caller Name: Dharma  Phone: [phone]  Patient: Miranda [name], Miranda [name]  Gender: Female  DOB: [birth year]  Age: 36 Years  PCP: G[facility])  Pregnant: No   Symptoms  Reason For Call & Symptoms: Pt is calling for 3rd day of migraine sx. Pt usually takes Immitrex at onset and then prn.  Reviewed Health History In EMR: Yes  Reviewed Medications In EMR: Yes  Reviewed Allergies In EMR: Yes  Date of Onset of Symptoms: 09/15/2012  Treatments Tried: Immitrex; hydrocodone ; Flexaril ; Motrin  Treatments Tried Worked: No OB:  LMP: 03/30/2012  Guideline(s) Used:  Headache  Headache  Disposition Per Guideline:   Callback by PCP or Subspecialist within 1 Hour  Reason For Disposition Reached:   Severe headache and not relieved by pain meds  Advice Given:  N/A  Office Follow Up:  Does the office need to follow up with this patient?: Yes  Instructions For The Office: Please call pt back and advise  Patient Refused Recommendation:  Patient Requests Prescription  Pt requests MD suggest alternate medication regime for migraines  RN Note:  Pt does not want to come in. she was seen for migraine h/a 2 weeks ago that was similar. This one has lasted 3 days. Would MD consider a different medication or changing Immitrex to something else? office please call back.

## 2012-10-14 ENCOUNTER — Ambulatory Visit: Payer: BC Managed Care – PPO | Admitting: Endocrinology

## 2012-10-18 ENCOUNTER — Ambulatory Visit: Payer: BC Managed Care – PPO | Admitting: Endocrinology

## 2012-10-22 ENCOUNTER — Encounter: Payer: Self-pay | Admitting: Family

## 2012-10-22 ENCOUNTER — Ambulatory Visit (INDEPENDENT_AMBULATORY_CARE_PROVIDER_SITE_OTHER): Payer: BC Managed Care – PPO | Admitting: Family

## 2012-10-22 VITALS — BP 110/70 | Temp 98.4°F | Wt 268.0 lb

## 2012-10-22 DIAGNOSIS — B009 Herpesviral infection, unspecified: Secondary | ICD-10-CM

## 2012-10-22 MED ORDER — VALACYCLOVIR HCL 1 G PO TABS: 1000.0000 mg | ORAL_TABLET | Freq: Two times a day (BID) | ORAL | Status: DC

## 2012-10-22 MED ORDER — DIPHENHYD-HYDROCORT-NYSTATIN MT SUSP: 10.0000 mL | Freq: Four times a day (QID) | OROMUCOSAL | Status: DC | PRN

## 2012-10-22 NOTE — Patient Instructions (Addendum)
2 Valtrex tabs twice a day x 1 day... (2 doses)  Cold Sores (Herpes Simplex Virus) The herpes simplex virus causes fever blisters or cold sores. Fever blisters are small sores on the lips, gums or roof of the mouth. People commonly get infected with this herpes virus but do not have any symptoms. The blisters may break out when a person:  Is especially tired.  Has a fever.  Is menstruating (in women  having a monthly period).  Under emotional stress.  Has another infection (such as a cold).  Is exposed to sunlight. Pain, tingling, itch, burning may occur before the blisters in the affected area. The blisters usually heal within a week. The virus can be easily passed to other people and to other parts of the body such as the eyes and sex organs. HOME CARE INSTRUCTIONS   Only take over-the-counter or prescription medicines for pain, discomfort, or fever as directed by your caregiver. Do not use aspirin.  Do not touch the blisters or pick the scabs. Wash your hands often and do not touch your eyes without washing your hands first.  To help reduce discomfort, apply an ice cube or ice pack to your lip for 30 minutes or suck on frozen juice bars.  Use a sun screen on your lips when you go outdoors.  This infection is contagious. Avoid close contact with other people, especially kissing or oral sex, until blisters heal. The virus that causes cold sores usually is different than the one that causes sores on the genitals. However, cold sores may occur in persons who have oral sex with a partner who has genital herpes.  Hot, cold, or salty foods may hurt your mouth. Use a straw to drink. Eating a well-balanced diet will help healing. SEEK IMMEDIATE MEDICAL CARE IF:   Your eye feels irritated, painful, or you feel like you have something in your eye.  You develop a fever, feel achy, or see pus instead of clear fluid in the sores. These are signs of a bacterial (germ) infection. You can apply  over-the-counter neomycin ointment.  You get blisters on your genitals.  You develop new unexplained symptoms. MAKE SURE YOU:   Understand these instructions.  Will watch your condition.  Will get help right away if you are not doing well or get worse. Document Released: 10/13/2000 Document Revised: 01/08/2012 Document Reviewed: 02/28/2012 [facility] Patient Information 2013 [place], [place].

## 2012-10-22 NOTE — Addendum Note (Signed)
Addended by: Beverely Low on: 10/22/2012 12:54 PM   Modules accepted: Orders

## 2012-10-22 NOTE — Progress Notes (Signed)
Subjective:    Patient ID: Miranda [name], female    DOB: Jun 02, 1976, 36 y.o.   MRN: [id number]  HPI  36 year old white female, smoker, patient of Dr. Clent Ridges is in today with complaints of sores on her lips, tongue, and the inside of her cheeks x1 month and worsening. She has a history of herpes simplex has never had an outbreak quite this severe. patient has been using Abreva over-the-counter does not open her symptoms. Patient reports an increase in her stress level and has subsequently been taken out of work half time. Describes the discomfort as burning and tingling, worse at night. Patient has been unable to eat normally.   Review of Systems  Constitutional: Positive for fatigue.  HENT: Negative.   Respiratory: Negative.   Cardiovascular: Negative.   Skin: Positive for rash.       Cold sores to the lips, chin, and time of the mouth  Neurological: Negative.   Psychiatric/Behavioral: Negative.    Past Medical History  Diagnosis Date  . Hypothyroidism     sees Dr. Leslie Dales  . Diabetes mellitus     type 2, on insulin pump, sees Dr. Leslie Dales  . GERD (gastroesophageal reflux disease)   . Hyperlipidemia   . Hypertension   . Migraine syndrome   . Neuropathy associated with endocrine disorder   . Asthma   . Neck pain   . Insomnia   . Depression     sees Dr. Clayborn Heron in [place]     History   Social History  . Marital Status: Married    Spouse Name: N/A    Number of Children: N/A  . Years of Education: N/A   Occupational History  . Not on file.   Social History Main Topics  . Smoking status: Current Every Day Smoker -- 1.0 packs/day    Types: Cigarettes    Last Attempt to Quit: 02/28/2012  . Smokeless tobacco: Never Used     Comment: less than 1 pack per day  . Alcohol Use: No  . Drug Use: No  . Sexually Active: Not on file   Other Topics Concern  . Not on file   Social History Narrative  . No narrative on file    No past surgical history on  file.  No family history on file.  Allergies  Allergen Reactions  . Exenatide     hives  . Lortab (Hydrocodone-Acetaminophen)   . Penicillins   . Tramadol Hcl     Current Outpatient Prescriptions on File Prior to Visit  Medication Sig Dispense Refill  . buPROPion (WELLBUTRIN SR) 150 MG 12 hr tablet Take 150 mg by mouth daily. Take 300 mg every morning       . cetirizine (ZYRTEC) 10 MG tablet Take 10 mg by mouth daily.        . cyclobenzaprine (FLEXERIL) 10 MG tablet Take 10 mg by mouth as needed.        . cycloSPORINE (RESTASIS) 0.05 % ophthalmic emulsion 1 drop 2 (two) times daily.      Marland Kitchen glucose blood (ONE TOUCH ULTRA TEST) test strip Use as directed to test blood sugar four times daily  120 each  6  . hydrOXYzine (ATARAX) 50 MG tablet Take 50 mg by mouth at bedtime as needed.        . hyoscyamine (LEVSIN) 0.125 MG/5ML ELIX Take 0.125 mg by mouth as needed.        . Insulin Infusion Pump (ONETOUCH PING INSULIN PUMP) KIT  by Does not apply route. One device      . Insulin Infusion Pump Supplies (INSET INFUSION SET 23" ) MISC 1 Device by Does not apply route every 3 (three) days.      . Insulin Infusion Pump Supplies (INSULIN PUMP SYRINGE RESERVOIR) MISC 1 Device by Does not apply route 3 (three) times daily with meals as needed.  30 each  3  . insulin lispro (HUMALOG) 100 UNIT/ML injection As directed for insulin pump      . levothyroxine (SYNTHROID, LEVOTHROID) 125 MCG tablet Take 125 mcg by mouth daily.        . metFORMIN (GLUCOPHAGE) 1000 MG tablet Take 1,000 mg by mouth 2 (two) times daily with a meal.        . metoprolol succinate (TOPROL-XL) 25 MG 24 hr tablet Take 12.5 mg by mouth daily.       . Multiple Vitamin (MULTIVITAMIN) tablet Take 1 tablet by mouth daily.        Marland Kitchen omeprazole (PRILOSEC) 20 MG capsule Take 20 mg by mouth daily.       Marland Kitchen omeprazole (PRILOSEC) 20 MG capsule TAKE ONE CAPSULE BY MOUTH TWICE A DAY  60 capsule  2  . oxyCODONE-acetaminophen (PERCOCET) 7.5-500  MG per tablet Take 1 tablet by mouth every 6 (six) hours as needed for pain.  60 tablet  0  . pioglitazone (ACTOS) 45 MG tablet Take 45 mg by mouth daily.        . potassium chloride SA (KLOR-CON M20) 20 MEQ tablet Take 20 mEq by mouth daily.       . prazosin (MINIPRESS) 5 MG capsule Take 5 mg by mouth at bedtime. Take 2      . promethazine (PHENERGAN) 25 MG tablet Take 25 mg by mouth as needed.        . risperiDONE (RISPERDAL) 2 MG tablet Take 2 mg by mouth daily.       . rizatriptan (MAXALT) 10 MG tablet Take 1 tablet (10 mg total) by mouth as needed for migraine.  10 tablet  11  . rosuvastatin (CRESTOR) 40 MG tablet Take 40 mg by mouth daily.      . sertraline (ZOLOFT) 100 MG tablet Take 100 mg by mouth daily. Take 2 every day      . SUMAtriptan (IMITREX) 100 MG tablet Take 100 mg by mouth as needed.        . SYMBICORT 160-4.5 MCG/ACT inhaler INHALE 2 PUFFS TWICE A DAY  10.2 g  2  . topiramate (TOPAMAX) 50 MG tablet Take 2 tablets (100 mg total) by mouth daily.  2 tablet  0  . zolpidem (AMBIEN) 10 MG tablet Take by mouth at bedtime as needed.         BP 110/70  Temp 98.4 F (36.9 C) (Oral)  Wt 268 lb (121.564 kg)chart    Objective:   Physical Exam  Constitutional: She appears well-developed and well-nourished.  Neck: Normal range of motion. Neck supple.  Cardiovascular: Normal rate, regular rhythm and normal heart sounds.   Pulmonary/Chest: Effort normal and breath sounds normal.  Skin: Skin is warm and dry.       Cold sores to the mouth externally, left chin, Right buccal membrane, and left tongue.  Psychiatric: She has a normal mood and affect.          Assessment & Plan  Assessment: Herpes Simplex  Plan:  Valtrex 2 g by mouth twice a day times one day. Magic mouthwash  gargle swish and swallow 4 times a day as needed. Patient call the office if symptoms worsen or persist. Recheck as scheduled, and as needed.

## 2012-10-24 ENCOUNTER — Telehealth: Payer: Self-pay | Admitting: Family Medicine

## 2012-10-24 ENCOUNTER — Ambulatory Visit (INDEPENDENT_AMBULATORY_CARE_PROVIDER_SITE_OTHER): Payer: BC Managed Care – PPO | Admitting: Family Medicine

## 2012-10-24 VITALS — BP 120/80 | HR 107 | Temp 98.7°F | Ht 61.0 in | Wt 267.0 lb

## 2012-10-24 DIAGNOSIS — R6889 Other general symptoms and signs: Secondary | ICD-10-CM

## 2012-10-24 DIAGNOSIS — J111 Influenza due to unidentified influenza virus with other respiratory manifestations: Secondary | ICD-10-CM

## 2012-10-24 MED ORDER — OSELTAMIVIR PHOSPHATE 75 MG PO CAPS: 75.0000 mg | ORAL_CAPSULE | Freq: Two times a day (BID) | ORAL | Status: DC

## 2012-10-24 NOTE — Progress Notes (Signed)
Chief Complaint  Patient presents with  . Cough    HPI:  -started: 2 days ago -symptoms:nasal congestion, sore throat, cough, sore throat, fever 102 last night per her report, body aches, weakness, malaise -denies:fever, SOB, NVD, tooth pain, strep or mono exposure -has tried: tylenol, ibuprofen, musinex - last dose of tylenol at 9am today -sick contacts: niece with similar symptoms -Hx of: hx of asthma - takes proventil as needed and symbicort, never severe or hospitalized for this, Hx of DM as well   ROS: See pertinent positives and negatives per HPI.  Past Medical History  Diagnosis Date  . Hypothyroidism     sees Dr. Leslie Dales  . Diabetes mellitus     type 2, on insulin pump, sees Dr. Leslie Dales  . GERD (gastroesophageal reflux disease)   . Hyperlipidemia   . Hypertension   . Migraine syndrome   . Neuropathy associated with endocrine disorder   . Asthma   . Neck pain   . Insomnia   . Depression     sees Dr. Clayborn Heron in [place]     No family history on file.  History   Social History  . Marital Status: Married    Spouse Name: N/A    Number of Children: N/A  . Years of Education: N/A   Social History Main Topics  . Smoking status: Current Every Day Smoker -- 1.0 packs/day    Types: Cigarettes    Last Attempt to Quit: 02/28/2012  . Smokeless tobacco: Never Used     Comment: less than 1 pack per day  . Alcohol Use: No  . Drug Use: No  . Sexually Active: Not on file   Other Topics Concern  . Not on file   Social History Narrative  . No narrative on file    Current outpatient prescriptions:buPROPion (WELLBUTRIN SR) 150 MG 12 hr tablet, Take 150 mg by mouth daily. Take 300 mg every morning , Disp: , Rfl: ;  cetirizine (ZYRTEC) 10 MG tablet, Take 10 mg by mouth daily.  , Disp: , Rfl: ;  cyclobenzaprine (FLEXERIL) 10 MG tablet, Take 10 mg by mouth as needed.  , Disp: , Rfl: ;  cycloSPORINE (RESTASIS) 0.05 % ophthalmic emulsion, 1 drop 2 (two) times  daily., Disp: , Rfl:  Diphenhyd-Hydrocort-Nystatin SUSP, Use as directed 10 mLs in the mouth or throat 4 (four) times daily as needed., Disp: 150 mL, Rfl: 0;  glucose blood (ONE TOUCH ULTRA TEST) test strip, Use as directed to test blood sugar four times daily, Disp: 120 each, Rfl: 6;  hydrOXYzine (ATARAX) 50 MG tablet, Take 50 mg by mouth at bedtime as needed.  , Disp: , Rfl: ;  hydrOXYzine (VISTARIL) 50 MG capsule, Take 1 tablet by mouth daily., Disp: , Rfl:  hyoscyamine (LEVSIN) 0.125 MG/5ML ELIX, Take 0.125 mg by mouth as needed.  , Disp: , Rfl: ;  Insulin Infusion Pump (ONETOUCH PING INSULIN PUMP) KIT, by Does not apply route. One device, Disp: , Rfl: ;  Insulin Infusion Pump Supplies (INSET INFUSION SET 23" ) MISC, 1 Device by Does not apply route every 3 (three) days., Disp: , Rfl:  Insulin Infusion Pump Supplies (INSULIN PUMP SYRINGE RESERVOIR) MISC, 1 Device by Does not apply route 3 (three) times daily with meals as needed., Disp: 30 each, Rfl: 3;  insulin lispro (HUMALOG) 100 UNIT/ML injection, As directed for insulin pump, Disp: , Rfl: ;  levothyroxine (SYNTHROID, LEVOTHROID) 125 MCG tablet, Take 125 mcg by mouth daily.  ,  Disp: , Rfl:  metFORMIN (GLUCOPHAGE) 1000 MG tablet, Take 1,000 mg by mouth 2 (two) times daily with a meal.  , Disp: , Rfl: ;  metoprolol succinate (TOPROL-XL) 25 MG 24 hr tablet, Take 12.5 mg by mouth daily. , Disp: , Rfl: ;  Multiple Vitamin (MULTIVITAMIN) tablet, Take 1 tablet by mouth daily.  , Disp: , Rfl: ;  omeprazole (PRILOSEC) 20 MG capsule, Take 20 mg by mouth daily. , Disp: , Rfl:  omeprazole (PRILOSEC) 20 MG capsule, TAKE ONE CAPSULE BY MOUTH TWICE A DAY, Disp: 60 capsule, Rfl: 2;  oseltamivir (TAMIFLU) 75 MG capsule, Take 1 capsule (75 mg total) by mouth 2 (two) times daily., Disp: 10 capsule, Rfl: 0;  oxyCODONE-acetaminophen (PERCOCET) 7.5-500 MG per tablet, Take 1 tablet by mouth every 6 (six) hours as needed for pain., Disp: 60 tablet, Rfl: 0;  pioglitazone  (ACTOS) 45 MG tablet, Take 45 mg by mouth daily.  , Disp: , Rfl:  potassium chloride SA (KLOR-CON M20) 20 MEQ tablet, Take 20 mEq by mouth daily. , Disp: , Rfl: ;  prazosin (MINIPRESS) 5 MG capsule, Take 5 mg by mouth at bedtime. Take 2, Disp: , Rfl: ;  promethazine (PHENERGAN) 25 MG tablet, Take 25 mg by mouth as needed.  , Disp: , Rfl: ;  risperiDONE (RISPERDAL) 2 MG tablet, Take 2 mg by mouth daily. , Disp: , Rfl:  rizatriptan (MAXALT) 10 MG tablet, Take 1 tablet (10 mg total) by mouth as needed for migraine., Disp: 10 tablet, Rfl: 11;  rosuvastatin (CRESTOR) 40 MG tablet, Take 40 mg by mouth daily., Disp: , Rfl: ;  sertraline (ZOLOFT) 100 MG tablet, Take 100 mg by mouth daily. Take 2 every day, Disp: , Rfl: ;  SUMAtriptan (IMITREX) 100 MG tablet, Take 100 mg by mouth as needed.  , Disp: , Rfl:  SYMBICORT 160-4.5 MCG/ACT inhaler, INHALE 2 PUFFS TWICE A DAY, Disp: 10.2 g, Rfl: 2;  topiramate (TOPAMAX) 50 MG tablet, Take 2 tablets (100 mg total) by mouth daily., Disp: 2 tablet, Rfl: 0;  valACYclovir (VALTREX) 1000 MG tablet, Take 1 tablet (1,000 mg total) by mouth 2 (two) times daily., Disp: 20 tablet, Rfl: 0;  zolpidem (AMBIEN) 10 MG tablet, Take by mouth at bedtime as needed. , Disp: , Rfl:   EXAM:  Filed Vitals:   10/24/12 1608  BP: 120/80  Pulse: 107  Temp: 98.7 F (37.1 C)    Body mass index is 50.45 kg/(m^2).  GENERAL: vitals reviewed and listed above, alert, oriented, appears well hydrated and in no acute distress  HEENT: atraumatic, conjunttiva clear, no obvious abnormalities on inspection of external nose and ears, normal appearance of ear canals and TMs, clear nasal congestion, mild post oropharyngeal erythema with PND, no tonsillar edema or exudate, no sinus TTP  NECK: no obvious masses on inspection  LUNGS: clear to auscultation bilaterally, no wheezes, rales or rhonchi, good air movement  CV: HRRR, no peripheral edema  MS: moves all extremities without noticeable  abnormality  PSYCH: pleasant and cooperative, no obvious depression or anxiety  ASSESSMENT AND PLAN:  Discussed the following assessment and plan:  1. Flu-like symptoms  POCT Influenza A/B, oseltamivir (TAMIFLU) 75 MG capsule   -discussed that this is likely flu versus flu like viral illness -lung exam normal with O2 sats 97% on RA, no fever in clinic today -discussed tx options risks/benefits including tamiflu, pt wanted to do rapid flu test (understanding potential for false neg) and then decide on tamiflu or  not -rapid flu positive and pt wants to start tamiflu - reasonable given high risk -Patient advised to return or notify a doctor immediately if symptoms worsen or persist or new concerns arise.  Patient Instructions   -As we discussed, we have prescribed a new medication (TAMIFLU) for you at this appointment. We discussed the common and serious potential adverse effects of this medication and you can review these and more with the pharmacist when you pick up your medication.  Please follow the instructions for use carefully and notify us immediately if you have any problems taking this medication.    Influenza Facts Flu (influenza) is a contagious respiratory illness caused by the influenza viruses. It can cause mild to severe illness. While most healthy people recover from the flu without specific treatment and without complications, older people, young children, and people with certain health conditions are at higher risk for serious complications from the flu, including death. CAUSES   The flu virus is spread from person to person by respiratory droplets from coughing and sneezing.  A person can also become infected by touching an object or surface with a virus on it and then touching their mouth, eye or nose.  Adults may be able to infect others from 1 day before symptoms occur and up to 7 days after getting sick. So it is possible to give someone the flu even before you know  you are sick and continue to infect others while you are sick. SYMPTOMS   Fever (usually high).  Headache.  Tiredness (can be extreme).  Cough.  Sore throat.  Runny or stuffy nose.  Body aches.  Diarrhea and vomiting may also occur, particularly in children.  These symptoms are referred to as "flu-like symptoms". A lot of different illnesses, including the common cold, can have similar symptoms. DIAGNOSIS   There are tests that can determine if you have the flu as long you are tested within the first 2 or 3 days of illness.  A doctor's exam and additional tests may be needed to identify if you have a disease that is a complicating the flu. RISKS AND COMPLICATIONS  Some of the complications caused by the flu include:  Bacterial pneumonia or progressive pneumonia caused by the flu virus.  Loss of body fluids (dehydration).  Worsening of chronic medical conditions, such as heart failure, asthma, or diabetes.  Sinus problems and ear infections. HOME CARE INSTRUCTIONS   Seek medical care early on.  If you are at high risk from complications of the flu, consult your health-care provider as soon as you develop flu-like symptoms. Those at high risk for complications include:  People 65 years or older.  People with chronic medical conditions, including diabetes.  Pregnant women.  Young children.  Your caregiver may recommend use of an antiviral medication to help treat the flu.  If you get the flu, get plenty of rest, drink a lot of liquids, and avoid using alcohol and tobacco.  You can take over-the-counter medications to relieve the symptoms of the flu if your caregiver approves. (Never give aspirin to children or teenagers who have flu-like symptoms, particularly fever). PREVENTION  The single best way to prevent the flu is to get a flu vaccine each fall. Other measures that can help protect against the flu are:  Antiviral Medications  A number of antiviral drugs  are approved for use in preventing the flu. These are prescription medications, and a doctor should be consulted before they are used.  Habits  for Good Health  Cover your nose and mouth with a tissue when you cough or sneeze, throw the tissue away after you use it.  Wash your hands often with soap and water, especially after you cough or sneeze. If you are not near water, use an alcohol-based hand cleaner.  Avoid people who are sick.  If you get the flu, stay home from work or school. Avoid contact with other people so that you do not make them sick, too.  Try not to touch your eyes, nose, or mouth as germs ore often spread this way. IN CHILDREN, EMERGENCY WARNING SIGNS THAT NEED URGENT MEDICAL ATTENTION:  Fast breathing or trouble breathing.  Bluish skin color.  Not drinking enough fluids.  Not waking up or not interacting.  Being so irritable that the child does not want to be held.  Flu-like symptoms improve but then return with fever and worse cough.  Fever with a rash. IN ADULTS, EMERGENCY WARNING SIGNS THAT NEED URGENT MEDICAL ATTENTION:  Difficulty breathing or shortness of breath.  Pain or pressure in the chest or abdomen.  Sudden dizziness.  Confusion.  Severe or persistent vomiting. SEEK IMMEDIATE MEDICAL CARE IF:  You or someone you know is experiencing any of the symptoms above. When you arrive at the emergency center,report that you think you have the flu. You may be asked to wear a mask and/or sit in a secluded area to protect others from getting sick. MAKE SURE YOU:   Understand these instructions.  Monitor your condition.  Seek medical care if you are getting worse, or not improving. Document Released: 10/19/2003 Document Revised: 01/08/2012 Document Reviewed: 07/15/2009 [facility] Patient Information 2013 [place], [place], [place].

## 2012-10-24 NOTE — Patient Instructions (Signed)
-As we discussed, we have prescribed a new medication (TAMIFLU) for you at this appointment. We discussed the common and serious potential adverse effects of this medication and you can review these and more with the pharmacist when you pick up your medication.  Please follow the instructions for use carefully and notify us immediately if you have any problems taking this medication.    Influenza Facts Flu (influenza) is a contagious respiratory illness caused by the influenza viruses. It can cause mild to severe illness. While most healthy people recover from the flu without specific treatment and without complications, older people, young children, and people with certain health conditions are at higher risk for serious complications from the flu, including death. CAUSES   The flu virus is spread from person to person by respiratory droplets from coughing and sneezing.  A person can also become infected by touching an object or surface with a virus on it and then touching their mouth, eye or nose.  Adults may be able to infect others from 1 day before symptoms occur and up to 7 days after getting sick. So it is possible to give someone the flu even before you know you are sick and continue to infect others while you are sick. SYMPTOMS   Fever (usually high).  Headache.  Tiredness (can be extreme).  Cough.  Sore throat.  Runny or stuffy nose.  Body aches.  Diarrhea and vomiting may also occur, particularly in children.  These symptoms are referred to as "flu-like symptoms". A lot of different illnesses, including the common cold, can have similar symptoms. DIAGNOSIS   There are tests that can determine if you have the flu as long you are tested within the first 2 or 3 days of illness.  A doctor's exam and additional tests may be needed to identify if you have a disease that is a complicating the flu. RISKS AND COMPLICATIONS  Some of the complications caused by the flu  include:  Bacterial pneumonia or progressive pneumonia caused by the flu virus.  Loss of body fluids (dehydration).  Worsening of chronic medical conditions, such as heart failure, asthma, or diabetes.  Sinus problems and ear infections. HOME CARE INSTRUCTIONS   Seek medical care early on.  If you are at high risk from complications of the flu, consult your health-care provider as soon as you develop flu-like symptoms. Those at high risk for complications include:  People 65 years or older.  People with chronic medical conditions, including diabetes.  Pregnant women.  Young children.  Your caregiver may recommend use of an antiviral medication to help treat the flu.  If you get the flu, get plenty of rest, drink a lot of liquids, and avoid using alcohol and tobacco.  You can take over-the-counter medications to relieve the symptoms of the flu if your caregiver approves. (Never give aspirin to children or teenagers who have flu-like symptoms, particularly fever). PREVENTION  The single best way to prevent the flu is to get a flu vaccine each fall. Other measures that can help protect against the flu are:  Antiviral Medications  A number of antiviral drugs are approved for use in preventing the flu. These are prescription medications, and a doctor should be consulted before they are used.  Habits for Good Health  Cover your nose and mouth with a tissue when you cough or sneeze, throw the tissue away after you use it.  Wash your hands often with soap and water, especially after you cough or  sneeze. If you are not near water, use an alcohol-based hand cleaner.  Avoid people who are sick.  If you get the flu, stay home from work or school. Avoid contact with other people so that you do not make them sick, too.  Try not to touch your eyes, nose, or mouth as germs ore often spread this way. IN CHILDREN, EMERGENCY WARNING SIGNS THAT NEED URGENT MEDICAL ATTENTION:  Fast  breathing or trouble breathing.  Bluish skin color.  Not drinking enough fluids.  Not waking up or not interacting.  Being so irritable that the child does not want to be held.  Flu-like symptoms improve but then return with fever and worse cough.  Fever with a rash. IN ADULTS, EMERGENCY WARNING SIGNS THAT NEED URGENT MEDICAL ATTENTION:  Difficulty breathing or shortness of breath.  Pain or pressure in the chest or abdomen.  Sudden dizziness.  Confusion.  Severe or persistent vomiting. SEEK IMMEDIATE MEDICAL CARE IF:  You or someone you know is experiencing any of the symptoms above. When you arrive at the emergency center,report that you think you have the flu. You may be asked to wear a mask and/or sit in a secluded area to protect others from getting sick. MAKE SURE YOU:   Understand these instructions.  Monitor your condition.  Seek medical care if you are getting worse, or not improving. Document Released: 10/19/2003 Document Revised: 01/08/2012 Document Reviewed: 07/15/2009 [facility] Patient Information 2013 [place], [place].

## 2012-10-24 NOTE — Telephone Encounter (Signed)
Patient Information:  Caller Name: Carolle  Phone: [phone]  Patient: Miranda [name]  Gender: Female  DOB: [birth year]  Age: 36 Years  PCP: Gershon Crane [facility])  Pregnant: No  Office Follow Up:  Does the office need to follow up with this patient?: No  Instructions For The Office: N/A   Symptoms  Reason For Call & Symptoms: Patient states Fever since Tuesday Evening 10/22/12- Highest 102.6 (o)  Afebrile at present. . +chest congestion- productive Brown. , sinus pressure /discomfort along with body aches.  She states she saw Dr. 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 Fever started after evaluation. she did not have cold symptoms at time of seeing Dr. Orvan Falconer.  She was given Valtrex and Magic Mouthwash.  Reviewed Health History In EMR: Yes  Reviewed Medications In EMR: Yes  Reviewed Allergies In EMR: Yes  Reviewed Surgeries / Procedures: No  Date of Onset of Symptoms: 10/22/2012  Treatments Tried: Magic mouthwash, Tylenol and Ibuprofen, Mucinex  Treatments Tried Worked: No  Any Fever: Yes  Fever Taken: Oral  Fever Time Of Reading: 15:00:00  Fever Last Reading: 98.0 OB / GYN:  LMP: Unknown  Guideline(s) Used:  Colds  Disposition Per Guideline:   See Today in Office  Reason For Disposition Reached:   Sinus pain (not just congestion) and fever  Advice Given:  Reassurance  Colds are caused by viruses, and no medicine or "shot" will cure an uncomplicated cold.  For a Stuffy Nose - Use Nasal Washes:  Introduction: Saline (salt water) nasal irrigation (nasal wash) is an effective and simple home remedy for treating stuffy nose and sinus congestion. The nose can be irrigated by pouring, spraying, or squirting salt water into the nose and then letting it run back out.  How it Helps: The salt water rinses out excess mucus, washes out any irritants (dust, allergens) that might be present, and moistens the nasal cavity.  Methods:  There are several ways to perform nasal irrigation. You can use a saline nasal spray bottle (available over-the-counter), a rubber ear syringe, a medical syringe without the needle, or a Neti Pot.  Humidifier:  If the air in your home is dry, use a cool-mist humidifier  Treatment for Associated Symptoms of Colds:  For muscle aches, headaches, or moderate fever (more than 101 F or 38.9 C): Take acetaminophen every 4 hours.  Cough: Use cough drops.  Hydrate: Drink adequate liquids.  Cough Medicines:  Home Remedy - Hard Candy: Hard candy works just as well as medicine-flavored OTC cough drops. Diabetics should use sugar-free candy.  Home Remedy - Honey: This old home remedy has been shown to help decrease coughing at night. The adult dosage is 2 teaspoons (10 ml) at bedtime. Honey should not be given to infants under one year of age.  Vitamin C:   A number of experts, including [employer], have promoted taking high doses of this vitamin as a treatment for the common cold.  Research to date shows that vitamin C has minimal (if any) effect on the duration or degree of cold symptoms. Thus, it cannot be recommended as a treatment.  Vitamin C is probably harmless in standard doses (less than 2 gm daily).  Zinc:   Some studies have reported that zinc gluconate lozenges (i.e., Cold-Eeze) may reduce the duration and severity of cold symptoms.  Dosage: Taken by mouth. You should take this with food to minimize the chance of nausea. Follow package  instructions.  Appointment Scheduled:  10/24/2012 16:00:00 Appointment Scheduled Provider:  Kriste Basque [facility])

## 2012-10-28 ENCOUNTER — Telehealth: Payer: Self-pay | Admitting: Family Medicine

## 2012-10-28 NOTE — Telephone Encounter (Signed)
Pt confused about dosage of Valtrex. MD told her to take 2 the first day, and one after that. Pt received  500 mg tablets , and script said 1000mg . She feels she may have received wrong dosage. OK to leave message.

## 2012-10-28 NOTE — Telephone Encounter (Signed)
Left message to advise pt instructions were to take 2 1000mg  tablets bid for one day, then stop. Advised pt that was given more just in case she needed them in the future

## 2012-11-06 ENCOUNTER — Encounter: Payer: Self-pay | Admitting: Family Medicine

## 2012-11-06 ENCOUNTER — Ambulatory Visit (INDEPENDENT_AMBULATORY_CARE_PROVIDER_SITE_OTHER): Payer: BC Managed Care – PPO | Admitting: Family Medicine

## 2012-11-06 VITALS — BP 110/70 | Temp 98.6°F | Wt 263.0 lb

## 2012-11-06 DIAGNOSIS — J111 Influenza due to unidentified influenza virus with other respiratory manifestations: Secondary | ICD-10-CM

## 2012-11-06 NOTE — Progress Notes (Signed)
  Subjective:    Patient ID: Miranda [name], female    DOB: Jul 05, 1976, 37 y.o.   MRN: [id number]  HPI Here to follow up on influenza. She was here on 10-24-12 for fevers, body aches, ST, diarrhea, and coughing. Most of this has resolved although she still has an occasional dry cough. She took a course of Tamiflu. She went back to work 3 days ago.    Review of Systems  Constitutional: Negative.   HENT: Negative.   Eyes: Negative.   Respiratory: Positive for cough.        Objective:   Physical Exam  Constitutional: She appears well-developed and well-nourished.  HENT:  Right Ear: External ear normal.  Left Ear: External ear normal.  Nose: Nose normal.  Mouth/Throat: Oropharynx is clear and moist.  Eyes: Conjunctivae normal are normal.  Pulmonary/Chest: Effort normal and breath sounds normal.  Lymphadenopathy:    She has no cervical adenopathy.          Assessment & Plan:  She is recovering from flu as expected.

## 2012-11-25 ENCOUNTER — Encounter: Payer: Self-pay | Admitting: Endocrinology

## 2012-11-25 ENCOUNTER — Ambulatory Visit: Payer: BC Managed Care – PPO | Admitting: Endocrinology

## 2012-11-25 ENCOUNTER — Ambulatory Visit (INDEPENDENT_AMBULATORY_CARE_PROVIDER_SITE_OTHER): Payer: BC Managed Care – PPO | Admitting: Endocrinology

## 2012-11-25 VITALS — BP 130/70 | HR 90 | Wt 262.0 lb

## 2012-11-25 DIAGNOSIS — E119 Type 2 diabetes mellitus without complications: Secondary | ICD-10-CM

## 2012-11-25 NOTE — Progress Notes (Signed)
Subjective:    Patient ID: Miranda [name], female    DOB: [birth year], 37 y.o.   MRN: [id number]  HPI pt returns for f/u of IDDM (dx'ed 2004; complicated by peripheral sensory neuropathy; she has a one-touch ping insulin infusion pump; she says she'll reconsider a continuous glucose monitor in the future).  It is highest in am, and lowest in the afternoon.  She still averages a total of approx 31 units/day, via her pump.  Pt says she just resumed checking cbg's.   Past Medical History  Diagnosis Date  . Hypothyroidism     sees Dr. Leslie Dales  . Diabetes mellitus     type 2, on insulin pump, sees Dr. Leslie Dales  . GERD (gastroesophageal reflux disease)   . Hyperlipidemia   . Hypertension   . Migraine syndrome   . Neuropathy associated with endocrine disorder   . Asthma   . Neck pain   . Insomnia   . Depression     sees Dr. Clayborn Heron in [place]     No past surgical history on file.  History   Social History  . Marital Status: Married    Spouse Name: N/A    Number of Children: N/A  . Years of Education: N/A   Occupational History  . Not on file.   Social History Main Topics  . Smoking status: Current Every Day Smoker -- 1.0 packs/day    Types: Cigarettes    Last Attempt to Quit: 02/28/2012  . Smokeless tobacco: Never Used     Comment: less than 1 pack per day  . Alcohol Use: No  . Drug Use: No  . Sexually Active: Not on file   Other Topics Concern  . Not on file   Social History Narrative  . No narrative on file    Current Outpatient Prescriptions on File Prior to Visit  Medication Sig Dispense Refill  . albuterol (PROVENTIL HFA;VENTOLIN HFA) 108 (90 BASE) MCG/ACT inhaler Inhale 2 puffs into the lungs every 6 (six) hours as needed.      Marland Kitchen buPROPion (WELLBUTRIN SR) 150 MG 12 hr tablet Take 300 mg by mouth daily. And 100 mg at noon      . cetirizine (ZYRTEC) 10 MG tablet Take 10 mg by mouth daily.        . cyclobenzaprine (FLEXERIL) 10 MG tablet Take 10 mg  by mouth as needed.        . cycloSPORINE (RESTASIS) 0.05 % ophthalmic emulsion 1 drop 2 (two) times daily.      . Diphenhyd-Hydrocort-Nystatin SUSP Use as directed 10 mLs in the mouth or throat 4 (four) times daily as needed.  150 mL  0  . glucose blood (ONE TOUCH ULTRA TEST) test strip Use as directed to test blood sugar four times daily  120 each  6  . hydrOXYzine (VISTARIL) 50 MG capsule Take 1 tablet by mouth daily.      . hyoscyamine (LEVSIN) 0.125 MG/5ML ELIX Take 0.125 mg by mouth as needed.        . Insulin Infusion Pump (ONETOUCH PING INSULIN PUMP) KIT by Does not apply route. One device      . Insulin Infusion Pump Supplies (INSET INFUSION SET 23" ) MISC 1 Device by Does not apply route every 3 (three) days.      . Insulin Infusion Pump Supplies (INSULIN PUMP SYRINGE RESERVOIR) MISC 1 Device by Does not apply route 3 (three) times daily with meals as needed.  30 each  3  . insulin lispro (HUMALOG) 100 UNIT/ML injection As directed for insulin pump      . levothyroxine (SYNTHROID, LEVOTHROID) 125 MCG tablet Take 125 mcg by mouth daily.        . metFORMIN (GLUCOPHAGE) 1000 MG tablet Take 1,000 mg by mouth 2 (two) times daily with a meal.        . metoprolol succinate (TOPROL-XL) 25 MG 24 hr tablet Take 12.5 mg by mouth daily.       . Multiple Vitamin (MULTIVITAMIN) tablet Take 1 tablet by mouth daily.        Marland Kitchen omeprazole (PRILOSEC) 20 MG capsule Take 20 mg by mouth daily.       Marland Kitchen oxyCODONE-acetaminophen (PERCOCET) 7.5-500 MG per tablet Take 1 tablet by mouth every 6 (six) hours as needed for pain.  60 tablet  0  . pioglitazone (ACTOS) 45 MG tablet Take 45 mg by mouth daily.        . potassium chloride SA (KLOR-CON M20) 20 MEQ tablet Take 20 mEq by mouth daily.       . prazosin (MINIPRESS) 5 MG capsule Take 5 mg by mouth at bedtime. Take 2      . promethazine (PHENERGAN) 25 MG tablet Take 25 mg by mouth as needed.        . risperiDONE (RISPERDAL) 2 MG tablet Take 2 mg by mouth daily.        . rizatriptan (MAXALT) 10 MG tablet Take 1 tablet (10 mg total) by mouth as needed for migraine.  10 tablet  11  . rosuvastatin (CRESTOR) 40 MG tablet Take 40 mg by mouth daily.      . sertraline (ZOLOFT) 100 MG tablet Take 100 mg by mouth daily. Take 2 every day      . SYMBICORT 160-4.5 MCG/ACT inhaler INHALE 2 PUFFS TWICE A DAY  10.2 g  2  . topiramate (TOPAMAX) 50 MG tablet Take 2 tablets (100 mg total) by mouth daily.  2 tablet  0  . valACYclovir (VALTREX) 1000 MG tablet Take 1 tablet (1,000 mg total) by mouth 2 (two) times daily.  20 tablet  0  . zolpidem (AMBIEN) 10 MG tablet Take by mouth at bedtime as needed.         Allergies  Allergen Reactions  . Exenatide     hives  . Lortab (Hydrocodone-Acetaminophen)   . Penicillins   . Tramadol Hcl     No family history on file.  BP 130/70  Pulse 90  Wt 262 lb (118.842 kg)  SpO2 95%  Review of Systems denies hypoglycemia.      Objective:   Physical Exam VITAL SIGNS:  See vs page GENERAL: no distress SKIN:  Insulin infusion sites at the anterior abdomen are normal.       Assessment & Plan:  DM: we can only make small adjustments now, until we have more cbg information.

## 2012-11-25 NOTE — Patient Instructions (Addendum)
reduce basal rate to 1 unit/hr, except 2 units/hr, 3 AM-6 AM.  continue mealtime bolus of 1 unit/10 grams carbohydrate.  continue correction bolus (which some people call "sensitivity," or "insulin sensitivity ratio," or just "isr") of 1 unit for each 30 by which your glucose exceeds 100. Please come back for a follow-up appointment in 3 months. blood tests are being requested for you today.  You will receive a letter with results.

## 2012-12-01 ENCOUNTER — Other Ambulatory Visit: Payer: Self-pay | Admitting: Family Medicine

## 2012-12-14 ENCOUNTER — Other Ambulatory Visit: Payer: Self-pay

## 2012-12-16 ENCOUNTER — Telehealth: Payer: Self-pay | Admitting: *Deleted

## 2012-12-16 NOTE — Telephone Encounter (Signed)
PATIENT NOTIFIED OF FORM READY FOR PICK UP FOR DEPARTMENT OF TRANSPORTATION. FORMS ALSO FAXED.

## 2013-02-24 ENCOUNTER — Other Ambulatory Visit: Payer: Self-pay | Admitting: Family Medicine

## 2013-02-25 ENCOUNTER — Encounter: Payer: Self-pay | Admitting: Endocrinology

## 2013-02-25 ENCOUNTER — Ambulatory Visit (INDEPENDENT_AMBULATORY_CARE_PROVIDER_SITE_OTHER): Payer: BC Managed Care – PPO | Admitting: Endocrinology

## 2013-02-25 VITALS — BP 128/74 | HR 70 | Wt 270.0 lb

## 2013-02-25 DIAGNOSIS — E119 Type 2 diabetes mellitus without complications: Secondary | ICD-10-CM

## 2013-02-25 MED ORDER — ROSUVASTATIN CALCIUM 40 MG PO TABS: 40.0000 mg | ORAL_TABLET | Freq: Every day | ORAL | Status: DC

## 2013-02-25 NOTE — Patient Instructions (Addendum)
Please increase your basal rate to 2 units/hr, except 3 units/hr, 3 AM-6 AM.  increase mealtime bolus to 1 unit/6 grams carbohydrate.    increase correction bolus (which some people call "sensitivity," or "insulin sensitivity ratio," or just "isr") to 1 unit for each 20 by which your glucose exceeds 100. Please come back for a follow-up appointment in 1 month.   Refer to a weight-loss surgery specialist.  you will receive a phone call, about a day and time for an appointment.

## 2013-02-25 NOTE — Progress Notes (Signed)
Subjective:    Patient ID: Miranda [name], female    DOB: 10-Mar-1976, 37 y.o.   MRN: [id number]  HPI pt returns for f/u of IDDM (dx'ed 2004; complicated by peripheral sensory neuropathy; she has a one-touch ping insulin infusion pump; she declines continuous glucose monitor; she has never had severe hypoglycemia or DKA).  no cbg record, but states cbg's now vary from 300-400.  She now takes a total of approx 100 units of insulin total per day, via her pump.  She reports weight gain and polyuria.  She is unable to cite precip factor for the hyperglycemia.   Past Medical History  Diagnosis Date  . Hypothyroidism     sees Dr. Leslie Dales  . Diabetes mellitus     type 2, on insulin pump, sees Dr. Leslie Dales  . GERD (gastroesophageal reflux disease)   . Hyperlipidemia   . Hypertension   . Migraine syndrome   . Neuropathy associated with endocrine disorder   . Asthma   . Neck pain   . Insomnia   . Depression     sees Dr. Clayborn Heron in [place]     No past surgical history on file.  History   Social History  . Marital Status: Married    Spouse Name: N/A    Number of Children: N/A  . Years of Education: N/A   Occupational History  . Not on file.   Social History Main Topics  . Smoking status: Current Every Day Smoker -- 1.00 packs/day    Types: Cigarettes    Last Attempt to Quit: 02/28/2012  . Smokeless tobacco: Never Used     Comment: less than 1 pack per day  . Alcohol Use: No  . Drug Use: No  . Sexually Active: Not on file   Other Topics Concern  . Not on file   Social History Narrative  . No narrative on file    Current Outpatient Prescriptions on File Prior to Visit  Medication Sig Dispense Refill  . albuterol (PROVENTIL HFA;VENTOLIN HFA) 108 (90 BASE) MCG/ACT inhaler Inhale 2 puffs into the lungs every 6 (six) hours as needed.      Marland Kitchen buPROPion (WELLBUTRIN SR) 150 MG 12 hr tablet Take 300 mg by mouth daily. And 100 mg at noon      . cetirizine (ZYRTEC) 10 MG  tablet Take 10 mg by mouth daily.        . cyclobenzaprine (FLEXERIL) 10 MG tablet Take 10 mg by mouth as needed.        . cycloSPORINE (RESTASIS) 0.05 % ophthalmic emulsion 1 drop 2 (two) times daily.      . Diphenhyd-Hydrocort-Nystatin SUSP Use as directed 10 mLs in the mouth or throat 4 (four) times daily as needed.  150 mL  0  . glucose blood (ONE TOUCH ULTRA TEST) test strip Use as directed to test blood sugar four times daily  120 each  6  . hydrOXYzine (VISTARIL) 50 MG capsule Take 1 tablet by mouth daily.      . hyoscyamine (LEVSIN) 0.125 MG/5ML ELIX Take 0.125 mg by mouth as needed.        . Insulin Infusion Pump (ONETOUCH PING INSULIN PUMP) KIT by Does not apply route. One device      . Insulin Infusion Pump Supplies (INSET INFUSION SET 23" ) MISC 1 Device by Does not apply route every 3 (three) days.      . Insulin Infusion Pump Supplies (INSULIN PUMP SYRINGE RESERVOIR) MISC 1 Device  by Does not apply route 3 (three) times daily with meals as needed.  30 each  3  . insulin lispro (HUMALOG) 100 UNIT/ML injection As directed for insulin pump, total of 100 units per day      . levothyroxine (SYNTHROID, LEVOTHROID) 125 MCG tablet Take 125 mcg by mouth daily.        . metFORMIN (GLUCOPHAGE) 1000 MG tablet Take 1,000 mg by mouth 2 (two) times daily with a meal.        . metoprolol succinate (TOPROL-XL) 25 MG 24 hr tablet Take 12.5 mg by mouth daily.       . metoprolol succinate (TOPROL-XL) 25 MG 24 hr tablet TAKE 1 TABLET BY MOUTH EVERY DAY  30 tablet  5  . Multiple Vitamin (MULTIVITAMIN) tablet Take 1 tablet by mouth daily.        Marland Kitchen omeprazole (PRILOSEC) 20 MG capsule Take 20 mg by mouth daily.       Marland Kitchen omeprazole (PRILOSEC) 20 MG capsule TAKE ONE CAPSULE BY MOUTH TWICE A DAY  60 capsule  2  . oxyCODONE-acetaminophen (PERCOCET) 7.5-500 MG per tablet Take 1 tablet by mouth every 6 (six) hours as needed for pain.  60 tablet  0  . pioglitazone (ACTOS) 45 MG tablet Take 45 mg by mouth daily.         . potassium chloride SA (KLOR-CON M20) 20 MEQ tablet Take 20 mEq by mouth daily.       . prazosin (MINIPRESS) 5 MG capsule Take 5 mg by mouth at bedtime. Take 2      . promethazine (PHENERGAN) 25 MG tablet Take 25 mg by mouth as needed.        . risperiDONE (RISPERDAL) 2 MG tablet Take 2 mg by mouth daily.       . rizatriptan (MAXALT) 10 MG tablet Take 1 tablet (10 mg total) by mouth as needed for migraine.  10 tablet  11  . sertraline (ZOLOFT) 100 MG tablet Take 100 mg by mouth daily. Take 2 every day      . SYMBICORT 160-4.5 MCG/ACT inhaler INHALE 2 PUFFS TWICE A DAY  10.2 g  2  . topiramate (TOPAMAX) 50 MG tablet Take 2 tablets (100 mg total) by mouth daily.  2 tablet  0  . valACYclovir (VALTREX) 1000 MG tablet Take 1 tablet (1,000 mg total) by mouth 2 (two) times daily.  20 tablet  0  . zolpidem (AMBIEN) 10 MG tablet Take by mouth at bedtime as needed.        No current facility-administered medications on file prior to visit.    Allergies  Allergen Reactions  . Exenatide     hives  . Lortab (Hydrocodone-Acetaminophen)   . Penicillins   . Tramadol Hcl     No family history on file.  BP 128/74  Pulse 70  Wt 270 lb (122.471 kg)  BMI 51.04 kg/m2  SpO2 97%    Review of Systems denies hypoglycemia.    Objective:   Physical Exam VITAL SIGNS:  See vs page GENERAL: no distress      Assessment & Plan:  DM: deterioration in control, usually due to weight gain Morbid obesity, worse.

## 2013-03-06 ENCOUNTER — Encounter: Payer: Self-pay | Admitting: Family Medicine

## 2013-03-10 ENCOUNTER — Telehealth: Payer: Self-pay | Admitting: Family Medicine

## 2013-03-10 DIAGNOSIS — M722 Plantar fascial fibromatosis: Secondary | ICD-10-CM

## 2013-03-10 NOTE — Telephone Encounter (Signed)
The referral was done  

## 2013-03-25 ENCOUNTER — Encounter: Payer: Self-pay | Admitting: Family Medicine

## 2013-03-31 ENCOUNTER — Ambulatory Visit (INDEPENDENT_AMBULATORY_CARE_PROVIDER_SITE_OTHER): Payer: BC Managed Care – PPO | Admitting: Endocrinology

## 2013-03-31 ENCOUNTER — Encounter: Payer: Self-pay | Admitting: Endocrinology

## 2013-03-31 VITALS — BP 136/80 | HR 80 | Ht 61.0 in | Wt 258.0 lb

## 2013-03-31 DIAGNOSIS — E119 Type 2 diabetes mellitus without complications: Secondary | ICD-10-CM

## 2013-03-31 DIAGNOSIS — G4730 Sleep apnea, unspecified: Secondary | ICD-10-CM

## 2013-03-31 DIAGNOSIS — E1039 Type 1 diabetes mellitus with other diabetic ophthalmic complication: Secondary | ICD-10-CM

## 2013-03-31 LAB — HEMOGLOBIN A1C: Hgb A1c MFr Bld: 8.6 % — ABNORMAL HIGH (ref 4.6–6.5)

## 2013-03-31 LAB — MICROALBUMIN / CREATININE URINE RATIO: Microalb Creat Ratio: 2 mg/g (ref 0.0–30.0)

## 2013-03-31 NOTE — Progress Notes (Signed)
Subjective:    Patient ID: Miranda [name], female    DOB: 22-Mar-1976, 37 y.o.   MRN: [id number]  HPI pt returns for f/u of IDDM (dx'ed 2004; she has mild sensory neuropathy of the lower extremities; she has no other associated complications; she has a one-touch ping insulin infusion pump; she declines continuous glucose monitor; she has never had severe hypoglycemia or DKA).  no cbg record, but states cbg's now vary 73-200, but most are in the high-100's.  There is no trend throughout the day.  She takes a total of approx 100 units of insulin total per day, via her pump.   Past Medical History  Diagnosis Date  . Hypothyroidism     sees Dr. Leslie Dales  . Diabetes mellitus     type 2, on insulin pump, sees Dr. Leslie Dales  . GERD (gastroesophageal reflux disease)   . Hyperlipidemia   . Hypertension   . Migraine syndrome   . Neuropathy associated with endocrine disorder   . Asthma   . Neck pain   . Insomnia   . Depression     sees Dr. Clayborn Heron in [place]     No past surgical history on file.  History   Social History  . Marital Status: Married    Spouse Name: N/A    Number of Children: N/A  . Years of Education: N/A   Occupational History  . Not on file.   Social History Main Topics  . Smoking status: Current Every Day Smoker -- 1.00 packs/day    Types: Cigarettes    Last Attempt to Quit: 02/28/2012  . Smokeless tobacco: Never Used     Comment: less than 1 pack per day  . Alcohol Use: No  . Drug Use: No  . Sexually Active: Not on file   Other Topics Concern  . Not on file   Social History Narrative  . No narrative on file    Current Outpatient Prescriptions on File Prior to Visit  Medication Sig Dispense Refill  . albuterol (PROVENTIL HFA;VENTOLIN HFA) 108 (90 BASE) MCG/ACT inhaler Inhale 2 puffs into the lungs every 6 (six) hours as needed.      Marland Kitchen buPROPion (WELLBUTRIN SR) 150 MG 12 hr tablet Take 300 mg by mouth daily. And 100 mg at noon      .  cetirizine (ZYRTEC) 10 MG tablet Take 10 mg by mouth daily.        . cyclobenzaprine (FLEXERIL) 10 MG tablet Take 10 mg by mouth as needed.        . cycloSPORINE (RESTASIS) 0.05 % ophthalmic emulsion 1 drop 2 (two) times daily.      . Diphenhyd-Hydrocort-Nystatin SUSP Use as directed 10 mLs in the mouth or throat 4 (four) times daily as needed.  150 mL  0  . glucose blood (ONE TOUCH ULTRA TEST) test strip Use as directed to test blood sugar four times daily  120 each  6  . hydrOXYzine (VISTARIL) 50 MG capsule Take 1 tablet by mouth daily.      . hyoscyamine (LEVSIN) 0.125 MG/5ML ELIX Take 0.125 mg by mouth as needed.        . Insulin Infusion Pump (ONETOUCH PING INSULIN PUMP) KIT by Does not apply route. One device      . Insulin Infusion Pump Supplies (INSET INFUSION SET 23" ) MISC 1 Device by Does not apply route every 3 (three) days.      . Insulin Infusion Pump Supplies (INSULIN PUMP SYRINGE  RESERVOIR) MISC 1 Device by Does not apply route 3 (three) times daily with meals as needed.  30 each  3  . insulin lispro (HUMALOG) 100 UNIT/ML injection As directed for insulin pump, total of 100 units per day      . levothyroxine (SYNTHROID, LEVOTHROID) 125 MCG tablet Take 125 mcg by mouth daily.        . metFORMIN (GLUCOPHAGE) 1000 MG tablet Take 1,000 mg by mouth 2 (two) times daily with a meal.        . metoprolol succinate (TOPROL-XL) 25 MG 24 hr tablet Take 12.5 mg by mouth daily.       . metoprolol succinate (TOPROL-XL) 25 MG 24 hr tablet TAKE 1 TABLET BY MOUTH EVERY DAY  30 tablet  5  . Multiple Vitamin (MULTIVITAMIN) tablet Take 1 tablet by mouth daily.        Marland Kitchen omeprazole (PRILOSEC) 20 MG capsule Take 20 mg by mouth daily.       Marland Kitchen omeprazole (PRILOSEC) 20 MG capsule TAKE ONE CAPSULE BY MOUTH TWICE A DAY  60 capsule  2  . oxyCODONE-acetaminophen (PERCOCET) 7.5-500 MG per tablet Take 1 tablet by mouth every 6 (six) hours as needed for pain.  60 tablet  0  . pioglitazone (ACTOS) 45 MG tablet  Take 45 mg by mouth daily.        . potassium chloride SA (KLOR-CON M20) 20 MEQ tablet Take 20 mEq by mouth daily.       . prazosin (MINIPRESS) 5 MG capsule Take 5 mg by mouth at bedtime. Take 2      . promethazine (PHENERGAN) 25 MG tablet Take 25 mg by mouth as needed.        . risperiDONE (RISPERDAL) 2 MG tablet Take 2 mg by mouth daily.       . rizatriptan (MAXALT) 10 MG tablet Take 1 tablet (10 mg total) by mouth as needed for migraine.  10 tablet  11  . rosuvastatin (CRESTOR) 40 MG tablet Take 1 tablet (40 mg total) by mouth daily.  30 tablet  2  . sertraline (ZOLOFT) 100 MG tablet Take 100 mg by mouth daily. Take 2 every day      . SYMBICORT 160-4.5 MCG/ACT inhaler INHALE 2 PUFFS TWICE A DAY  10.2 g  2  . topiramate (TOPAMAX) 50 MG tablet Take 2 tablets (100 mg total) by mouth daily.  2 tablet  0  . valACYclovir (VALTREX) 1000 MG tablet Take 1 tablet (1,000 mg total) by mouth 2 (two) times daily.  20 tablet  0  . zolpidem (AMBIEN) 10 MG tablet Take by mouth at bedtime as needed.        No current facility-administered medications on file prior to visit.    Allergies  Allergen Reactions  . Exenatide     hives  . Lortab (Hydrocodone-Acetaminophen)   . Penicillins   . Tramadol Hcl     No family history on file.  BP 136/80  Pulse 80  Ht 5\' 1"  (1.549 m)  Wt 258 lb (117.028 kg)  BMI 48.77 kg/m2  SpO2 98%  Review of Systems denies hypoglycemia    Objective:   Physical Exam VITAL SIGNS:  See vs page GENERAL: no distress SKIN:  Insulin infusion sites at the anterior abdomen are normal.    Lab Results  Component Value Date   HGBA1C 8.6* 03/31/2013      Assessment & Plan:  DM: This insulin pump regimen was chosen from multiple  options, as it best matches his insulin to his changing requirements throughout the day.  The benefits of glycemic control must be weighed against the risks of hypoglycemia.  She needs increased rx.

## 2013-03-31 NOTE — Patient Instructions (Addendum)
Please continue your basal rate of 2 units/hr, except 3 units/hr, 3 AM-6 AM.  continue mealtime bolus of 1 unit/6 grams carbohydrate.    continue correction bolus (which some people call "sensitivity," or "insulin sensitivity ratio," or just "isr") to 1 unit for each 20 by which your glucose exceeds 100. Please come back for a follow-up appointment in 3 months.   Please continue to pursue the weight-loss surgery.   check your blood sugar 4 times a day--before the 3 meals, and at bedtime.  also check if you have symptoms of your blood sugar being too high or too low.  please keep a record of the readings and bring it to your next appointment here.  please call us sooner if your blood sugar goes below 70, or if you have a lot of readings over 200.

## 2013-04-01 ENCOUNTER — Telehealth: Payer: Self-pay | Admitting: Endocrinology

## 2013-04-01 NOTE — Telephone Encounter (Signed)
please call patient: Blood sugar is still high Please increase mealtime bolus to 1 unit/5 grams carbohydrate.

## 2013-04-01 NOTE — Telephone Encounter (Signed)
Pt called back left voice mail, that she received message

## 2013-04-03 ENCOUNTER — Ambulatory Visit (INDEPENDENT_AMBULATORY_CARE_PROVIDER_SITE_OTHER): Payer: BC Managed Care – PPO | Admitting: General Surgery

## 2013-04-09 ENCOUNTER — Encounter (INDEPENDENT_AMBULATORY_CARE_PROVIDER_SITE_OTHER): Payer: Self-pay | Admitting: General Surgery

## 2013-04-09 ENCOUNTER — Ambulatory Visit (INDEPENDENT_AMBULATORY_CARE_PROVIDER_SITE_OTHER): Payer: BC Managed Care – PPO | Admitting: General Surgery

## 2013-04-09 ENCOUNTER — Telehealth: Payer: Self-pay

## 2013-04-09 ENCOUNTER — Other Ambulatory Visit (INDEPENDENT_AMBULATORY_CARE_PROVIDER_SITE_OTHER): Payer: Self-pay

## 2013-04-09 VITALS — BP 122/74 | HR 76 | Temp 97.6°F | Resp 16 | Ht 61.75 in | Wt 254.2 lb

## 2013-04-09 DIAGNOSIS — K2100 Gastro-esophageal reflux disease with esophagitis, without bleeding: Secondary | ICD-10-CM

## 2013-04-09 DIAGNOSIS — E1039 Type 1 diabetes mellitus with other diabetic ophthalmic complication: Secondary | ICD-10-CM

## 2013-04-09 DIAGNOSIS — Z6841 Body Mass Index (BMI) 40.0 and over, adult: Secondary | ICD-10-CM

## 2013-04-09 NOTE — Patient Instructions (Addendum)
We will start the evaluation process. Please call me with any questions. Remember you need to stop smoking

## 2013-04-09 NOTE — Telephone Encounter (Signed)
done

## 2013-04-09 NOTE — Telephone Encounter (Signed)
Pt left voicemail stating she needs referral for pump training [phone]

## 2013-04-09 NOTE — Telephone Encounter (Signed)
Left voicemail pt states an understanding

## 2013-04-11 ENCOUNTER — Encounter (INDEPENDENT_AMBULATORY_CARE_PROVIDER_SITE_OTHER): Payer: Self-pay

## 2013-04-11 ENCOUNTER — Encounter: Payer: BC Managed Care – PPO | Attending: Endocrinology | Admitting: *Deleted

## 2013-04-14 ENCOUNTER — Telehealth: Payer: Self-pay

## 2013-04-14 NOTE — Telephone Encounter (Signed)
Pt left voicemail stating she did not get a new rx for insulin pump contour next link

## 2013-04-14 NOTE — Telephone Encounter (Signed)
Pt states this is a new meter and pump that she just got a couple of days ago so there is no form that has been done

## 2013-04-14 NOTE — Telephone Encounter (Signed)
Pt would have no way of knowing if we have done a form. What does pt want.  Just a prescription? Who do we send it to?

## 2013-04-14 NOTE — Telephone Encounter (Signed)
i recall recently dong a form for this.  If the pt is inquiring, we'll have to retreive the form.

## 2013-04-15 ENCOUNTER — Other Ambulatory Visit: Payer: Self-pay

## 2013-04-15 MED ORDER — GLUCOSE BLOOD VI STRP: 1.0000 | ORAL_STRIP | Freq: Four times a day (QID) | Status: DC

## 2013-04-15 NOTE — Telephone Encounter (Signed)
Pt states she needs test strips for meter

## 2013-04-15 NOTE — Telephone Encounter (Signed)
The contour next is a meter.  i would be happy to send rx for this.   Your call also mentions a pump.  Do you also need rx for this?

## 2013-04-15 NOTE — Telephone Encounter (Signed)
Ok, i sent.

## 2013-04-15 NOTE — Telephone Encounter (Signed)
Pt states she needs rx for contour next link sent to [facility] [address]  

## 2013-04-15 NOTE — Addendum Note (Signed)
Addended by: Romero Belling on: 04/15/2013 05:03 PM   Modules accepted: Orders

## 2013-04-15 NOTE — Telephone Encounter (Signed)
Yes pt needs rx for pump  Contour next link

## 2013-04-15 NOTE — Telephone Encounter (Signed)
Pt states she needs rx for contour next link sent to [facility] [address]

## 2013-04-18 ENCOUNTER — Ambulatory Visit: Payer: BC Managed Care – PPO | Admitting: *Deleted

## 2013-04-23 ENCOUNTER — Ambulatory Visit: Payer: BC Managed Care – PPO | Admitting: *Deleted

## 2013-04-23 ENCOUNTER — Encounter: Payer: BC Managed Care – PPO | Admitting: *Deleted

## 2013-04-28 ENCOUNTER — Ambulatory Visit (INDEPENDENT_AMBULATORY_CARE_PROVIDER_SITE_OTHER): Payer: BC Managed Care – PPO | Admitting: Family Medicine

## 2013-04-28 ENCOUNTER — Encounter: Payer: Self-pay | Admitting: Family Medicine

## 2013-04-28 ENCOUNTER — Telehealth: Payer: Self-pay | Admitting: Family Medicine

## 2013-04-28 VITALS — BP 120/64 | HR 116 | Temp 98.3°F | Ht 61.75 in | Wt 250.0 lb

## 2013-04-28 DIAGNOSIS — Z0000 Encounter for general adult medical examination without abnormal findings: Secondary | ICD-10-CM

## 2013-04-28 LAB — POCT URINALYSIS DIPSTICK
Blood, UA: 5
Glucose, UA: NEGATIVE
Nitrite, UA: NEGATIVE
Protein, UA: NEGATIVE
Spec Grav, UA: 1.015
Urobilinogen, UA: 0.2
pH, UA: 5

## 2013-04-28 LAB — HEPATIC FUNCTION PANEL
ALT: 64 U/L — ABNORMAL HIGH (ref 0–35)
Albumin: 4.2 g/dL (ref 3.5–5.2)
Alkaline Phosphatase: 45 U/L (ref 39–117)
Bilirubin, Direct: 0 mg/dL (ref 0.0–0.3)
Total Protein: 7.2 g/dL (ref 6.0–8.3)

## 2013-04-28 LAB — CBC WITH DIFFERENTIAL/PLATELET
Basophils Absolute: 0.1 10*3/uL (ref 0.0–0.1)
Eosinophils Absolute: 0.2 10*3/uL (ref 0.0–0.7)
HCT: 39.7 % (ref 36.0–46.0)
Hemoglobin: 13.2 g/dL (ref 12.0–15.0)
Lymphs Abs: 2.4 10*3/uL (ref 0.7–4.0)
MCHC: 33.3 g/dL (ref 30.0–36.0)
Neutro Abs: 5.7 10*3/uL (ref 1.4–7.7)
RDW: 14.2 % (ref 11.5–14.6)

## 2013-04-28 LAB — BASIC METABOLIC PANEL
CO2: 25 mEq/L (ref 19–32)
Calcium: 9.7 mg/dL (ref 8.4–10.5)
Chloride: 103 mEq/L (ref 96–112)
Glucose, Bld: 142 mg/dL — ABNORMAL HIGH (ref 70–99)
Sodium: 137 mEq/L (ref 135–145)

## 2013-04-28 LAB — LIPID PANEL
HDL: 37.5 mg/dL — ABNORMAL LOW (ref >39.00)
Total CHOL/HDL Ratio: 2
Triglycerides: 115 mg/dL (ref 0.0–149.0)

## 2013-04-28 MED ORDER — POTASSIUM CHLORIDE CRYS ER 20 MEQ PO TBCR: 20.0000 meq | EXTENDED_RELEASE_TABLET | Freq: Every day | ORAL | Status: DC

## 2013-04-28 MED ORDER — ALBUTEROL SULFATE HFA 108 (90 BASE) MCG/ACT IN AERS: 2.0000 | INHALATION_SPRAY | RESPIRATORY_TRACT | Status: DC | PRN

## 2013-04-28 MED ORDER — LEVOTHYROXINE SODIUM 125 MCG PO TABS: 125.0000 ug | ORAL_TABLET | Freq: Every day | ORAL | Status: DC

## 2013-04-28 MED ORDER — HYDROCODONE-ACETAMINOPHEN 5-325 MG PO TABS: 1.0000 | ORAL_TABLET | Freq: Four times a day (QID) | ORAL | Status: DC | PRN

## 2013-04-28 MED ORDER — BUDESONIDE-FORMOTEROL FUMARATE 160-4.5 MCG/ACT IN AERO: 2.0000 | INHALATION_SPRAY | Freq: Two times a day (BID) | RESPIRATORY_TRACT | Status: DC

## 2013-04-28 MED ORDER — FUROSEMIDE 20 MG PO TABS: 20.0000 mg | ORAL_TABLET | Freq: Every day | ORAL | Status: DC

## 2013-04-28 MED ORDER — INSULIN ASPART 100 UNIT/ML ~~LOC~~ SOLN: SUBCUTANEOUS | Status: DC

## 2013-04-28 MED ORDER — CYCLOBENZAPRINE HCL 10 MG PO TABS: 10.0000 mg | ORAL_TABLET | Freq: Three times a day (TID) | ORAL | Status: DC | PRN

## 2013-04-28 NOTE — Progress Notes (Signed)
  Subjective:    Patient ID: Miranda [name], female    DOB: [birth year], 37 y.o.   MRN: [id number]  HPI 36 yr old female for a cpx. She has been doing well except for plantar fasciitis. She is seeing Dr. Lajoyce Corners, and they are contemplating surgery for this at some point. She has lost 20 lbs in the past 3 months.    Review of Systems  Constitutional: Negative.   HENT: Negative.   Eyes: Negative.   Respiratory: Negative.   Cardiovascular: Negative.   Gastrointestinal: Negative.   Genitourinary: Negative for dysuria, urgency, frequency, hematuria, flank pain, decreased urine volume, enuresis, difficulty urinating, pelvic pain and dyspareunia.  Musculoskeletal: Negative.   Skin: Negative.   Neurological: Negative.   Psychiatric/Behavioral: Negative.        Objective:   Physical Exam  Constitutional: She is oriented to person, place, and time. She appears well-developed and well-nourished. No distress.  HENT:  Head: Normocephalic and atraumatic.  Right Ear: External ear normal.  Left Ear: External ear normal.  Nose: Nose normal.  Mouth/Throat: Oropharynx is clear and moist. No oropharyngeal exudate.  Eyes: Conjunctivae and EOM are normal. Pupils are equal, round, and reactive to light. No scleral icterus.  Neck: Normal range of motion. Neck supple. No JVD present. No thyromegaly present.  Cardiovascular: Normal rate, regular rhythm, normal heart sounds and intact distal pulses.  Exam reveals no gallop and no friction rub.   No murmur heard. Pulmonary/Chest: Effort normal and breath sounds normal. No respiratory distress. She has no wheezes. She has no rales. She exhibits no tenderness.  Abdominal: Soft. Bowel sounds are normal. She exhibits no distension and no mass. There is no tenderness. There is no rebound and no guarding.  Musculoskeletal: Normal range of motion. She exhibits no edema and no tenderness.  Lymphadenopathy:    She has no cervical adenopathy.  Neurological: She is  alert and oriented to person, place, and time. She has normal reflexes. No cranial nerve deficit. She exhibits normal muscle tone. Coordination normal.  Skin: Skin is warm and dry. No rash noted. No erythema.  Psychiatric: She has a normal mood and affect. Her behavior is normal. Judgment and thought content normal.          Assessment & Plan:  Well exam. Get fasting labs

## 2013-04-28 NOTE — Telephone Encounter (Signed)
Pt's Proventil inhaler requires prior auth. They also require she try 2 alternatives first. Is a Pro-Air inhaler or any other lower tier generic inhaler an option? Please advise.

## 2013-04-29 MED ORDER — ALBUTEROL SULFATE HFA 108 (90 BASE) MCG/ACT IN AERS
2.0000 | INHALATION_SPRAY | RESPIRATORY_TRACT | Status: DC | PRN
Start: 2013-04-29 — End: 2017-07-24

## 2013-04-29 NOTE — Progress Notes (Signed)
Patient ID: Miranda [name], female   DOB: 1976/05/30, 37 y.o.   MRN: [id number] Delayed note entry Chief Complaint  Patient presents with  . New Evaluation    new bariatric    HPI Miranda [name] is a 37 y.o. female.   HPI 37 year old morbidly obese female referred by Dr. Shellia Carwin for evaluation for weight loss surgery.The patient states that she has struggled with her weight most of her adult life.The patient works as a Systems analyst at [employer]. She has tried weight watchers, Redux, and phentermine all without any long-term success.  She also uses My FitnessPal. She does water aerobics and weight lifting. Despite all these efforts and numerous attempts for sustained weight loss she has been unsuccessful. She is most interested in the gastric bypass procedure.  She does smoke cigarettes. She smokes less than one pack per day. She sees a number of different  Physicians. She has a primary care physician, endocrinologist, gastroenterologist, podiatrist, neurologist, urologist, nutritionist, chiropractor, psychologist, psychiatrist, art therapist, and marriage counselor.  Past Medical History  Diagnosis Date  . Hypothyroidism     sees Dr. Leslie Dales  . GERD (gastroesophageal reflux disease)   . Hyperlipidemia   . Hypertension   . Migraine syndrome   . Neuropathy associated with endocrine disorder   . Asthma   . Neck pain   . Insomnia   . Depression     sees Dr. Clayborn Heron in [place]   . Diabetes mellitus     type 2, on insulin pump, sees Dr. Everardo All     Past Surgical History  Procedure Laterality Date  . Blocked itestinal repair    . Hernia repair    . Cholecystectomy      History reviewed. No pertinent family history.  Social History History  Substance Use Topics  . Smoking status: Former Smoker -- 1.00 packs/day    Types: Cigarettes    Quit date: 04/27/2013  . Smokeless tobacco: Never Used     Comment: less than 1 pack per day  . Alcohol Use: No     Allergies  Allergen Reactions  . Exenatide     hives  . Lortab (Hydrocodone-Acetaminophen)   . Penicillins   . Tramadol Hcl     Current Outpatient Prescriptions  Medication Sig Dispense Refill  . cetirizine (ZYRTEC) 10 MG tablet Take 10 mg by mouth daily.        . cycloSPORINE (RESTASIS) 0.05 % ophthalmic emulsion 1 drop 2 (two) times daily.      . hydrOXYzine (VISTARIL) 50 MG capsule Take 1 tablet by mouth daily as needed.       . hyoscyamine (LEVSIN) 0.125 MG/5ML ELIX Take 0.125 mg by mouth as needed.        . Insulin Infusion Pump (ONETOUCH PING INSULIN PUMP) KIT by Does not apply route. One device      . Insulin Infusion Pump Supplies (INSET INFUSION SET 23" ) MISC 1 Device by Does not apply route every 3 (three) days.      . Insulin Infusion Pump Supplies (INSULIN PUMP SYRINGE RESERVOIR) MISC 1 Device by Does not apply route 3 (three) times daily with meals as needed.  30 each  3  . metFORMIN (GLUCOPHAGE) 1000 MG tablet Take 1,000 mg by mouth 2 (two) times daily with a meal.        . Multiple Vitamin (MULTIVITAMIN) tablet Take 1 tablet by mouth daily.        Marland Kitchen omeprazole (PRILOSEC) 20 MG  capsule Take 20 mg by mouth daily.       . pioglitazone (ACTOS) 45 MG tablet Take 45 mg by mouth daily.        . prazosin (MINIPRESS) 5 MG capsule Take 5 mg by mouth at bedtime. Take 2      . promethazine (PHENERGAN) 25 MG tablet Take 25 mg by mouth as needed.        . risperiDONE (RISPERDAL) 2 MG tablet Take 2 mg by mouth daily.       . rizatriptan (MAXALT) 10 MG tablet Take 1 tablet (10 mg total) by mouth as needed for migraine.  10 tablet  11  . rosuvastatin (CRESTOR) 40 MG tablet Take 1 tablet (40 mg total) by mouth daily.  30 tablet  2  . sertraline (ZOLOFT) 100 MG tablet Take 100 mg by mouth daily. Take 2 every day      . topiramate (TOPAMAX) 50 MG tablet Take 2 tablets (100 mg total) by mouth daily.  2 tablet  0  . valACYclovir (VALTREX) 1000 MG tablet Take 1 tablet (1,000 mg total)  by mouth 2 (two) times daily.  20 tablet  0  . albuterol (PROAIR HFA) 108 (90 BASE) MCG/ACT inhaler Inhale 2 puffs into the lungs every 4 (four) hours as needed for wheezing.  8.5 g  11  . budesonide-formoterol (SYMBICORT) 160-4.5 MCG/ACT inhaler Inhale 2 puffs into the lungs 2 (two) times daily.  30.4 g  3  . buPROPion (WELLBUTRIN XL) 300 MG 24 hr tablet Take 300 mg by mouth daily.      . cyclobenzaprine (FLEXERIL) 10 MG tablet Take 1 tablet (10 mg total) by mouth 3 (three) times daily as needed for muscle spasms.  90 tablet  3  . furosemide (LASIX) 20 MG tablet Take 1 tablet (20 mg total) by mouth daily.  90 tablet  3  . glucose blood (BAYER CONTOUR NEXT TEST) test strip 1 each by Other route 4 (four) times daily. And lancets 4/day  120 each  12  . HYDROcodone-acetaminophen (NORCO/VICODIN) 5-325 MG per tablet Take 1 tablet by mouth every 6 (six) hours as needed for pain.  60 tablet  2  . insulin aspart (NOVOLOG) 100 UNIT/ML injection Per insulin pump  1 vial  12  . levothyroxine (SYNTHROID, LEVOTHROID) 125 MCG tablet Take 1 tablet (125 mcg total) by mouth daily.  90 tablet  3  . metoprolol succinate (TOPROL-XL) 25 MG 24 hr tablet       . potassium chloride SA (KLOR-CON M20) 20 MEQ tablet Take 1 tablet (20 mEq total) by mouth daily.  90 tablet  3  . zolpidem (AMBIEN) 10 MG tablet Take by mouth at bedtime as needed.        No current facility-administered medications for this visit.    Review of Systems Review of Systems  Respiratory: Positive for apnea. Negative for cough and shortness of breath.        Uses CPAP for obstructive sleep apnea; uses inhalers as needed for asthma  Cardiovascular: Positive for leg swelling. Negative for chest pain.       Takes metoprolol for irregular heartbeat not because of hypertension; denies chest pain, shortness of breath, orthopnea, paroxysmal nocturnal dyspnea. Has some dyspnea on exertion. Has some occasional leg swelling.states she had a cardiac workup in  2011 by Dr.James Clark  Gastrointestinal: Negative for abdominal pain, diarrhea, constipation and blood in stool.       Takes omeprazole for reflux disease; has  had a cholecystectomy; has had an upper endoscopy in [place]; denies diarrhea, constipation, melena or hematochezia; patient relates that she had blocked intestinal repair at age 74 months. In discussing it with her it sounds like she had intussusception  Endocrine:       Has insulin-dependent diabetes mellitus. States that she was diagnosed in June 2005. Takes medication for hypothyroidism  Genitourinary: Positive for menstrual problem. Negative for dysuria, decreased urine volume and difficulty urinating.       G1 P0, has irregular menses, takes progesterone for it; has PCOS; states she sees Julio Sicks RN for gyn issues  Musculoskeletal:       Has bilateral knee pain, endorses some carpal tunnel symptoms; states that she takes Percocet for her plantar fasciitis  Neurological: Positive for numbness and headaches. Negative for tremors, seizures, facial asymmetry, weakness and light-headedness.       Denies TIAs and amaurosis fugax; has migraines about once a week; has some peripheral neuropathy from diabetes  Psychiatric/Behavioral: Positive for behavioral problems. Negative for suicidal ideas, hallucinations and self-injury.       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 rate 16, height 5' 1.75" (1.568 m), weight 254 lb 3.2 oz (115.304 kg).  Physical Exam Physical Exam  Vitals reviewed. Constitutional: She is oriented to person, place, and time. She appears well-developed and well-nourished.  Morbidly obese  HENT:  Head: Normocephalic and atraumatic.  Right Ear: External ear normal.  Left Ear: External ear normal.  Eyes: Conjunctivae are normal. No scleral icterus.  Neck: Normal range of motion. Neck supple. No  tracheal deviation present. No thyromegaly present.  Cardiovascular: Normal rate, regular rhythm, normal heart sounds and intact distal pulses.   Pulmonary/Chest: Effort normal. No respiratory distress. She has no wheezes.  Abdominal: Soft. She exhibits no distension. There is no tenderness. There is no rebound and no guarding.  Insulin pump  Musculoskeletal: She exhibits no edema.  Lymphadenopathy:    She has no cervical adenopathy.  Neurological: She is alert and oriented to person, place, and time. She exhibits normal muscle tone.  Skin: Skin is warm and dry. No rash noted. No erythema. No pallor.  Psychiatric: She has a normal mood and affect. Her behavior is normal. Judgment and thought content normal.    Data Reviewed Pt's medication form and physician form Dr George Hugh note from 6/2 Dr Claris Che last note Hgb A1C 8.6 on 03/31/13  Assessment    Morbid obesity BMI 46.8 Dislipidemia IDDM Hypothyroid GERD B/l Knee pain Dissociative Identity Disorder HTN? Depression Tobacco use     Plan    The patient meets weight loss surgery criteria. I think the patient would be an acceptable candidate for Laparoscopic Roux-en-Y Gastric bypass pending psychiatric/psychologist evaluation AND if she stops smoking. I explained to the patient that I would not perform a gastric bypass on her as long as she continued to smoke. I Discussed the association between tobacco use and marginal ulcer formation. I explained to her that we would check her urine for nicotine once she got through the initial evaluation stage.  We discussed laparoscopic Roux-en-Y gastric bypass. We discussed the preoperative, operative and postoperative process. Using diagrams, I explained the surgery in detail including the performance of an EGD near the end of the surgery and an Upper GI swallow study on POD 1. We discussed the typical hospital course including a 2-3 day stay baring  any complications.   The patient was given  educational material. I quoted the patient that they can expect to lose 50-70% of their excess weight with the gastric bypass. We did discuss the possibility of weight regain several years after the procedure.  We discussed the risk and benefits of surgery including but not limited to anesthesia risk, bleeding, infection, anastomotic edema requiring a few additional days in the hospital, postop nausea, blood clot formation, anastomotic leak, anastomotic stricture, ulcer formation, death, respiratory complications, intestinal blockage, internal hernia, gallstone formation, vitamin and nutritional deficiencies, injury to surrounding structures, failure to lose weight and mood changes.  We discussed that before and after surgery that there would be an alteration in their diet. I explained that we have put them on a diet 2 weeks before surgery. I also explained that they would be on a liquid diet for 2 weeks after surgery. We discussed that they would have to avoid certain foods such as sugar after surgery. We discussed the importance of physical activity as well as compliance with our dietary and supplement recommendations and routine follow-up.  I explained to the patient that we will start our evaluation process which includes labs, Upper GI to evaluate stomach and swallowing anatomy, nutritionist consultation, psychiatrist consultation, EKG, CXR, abdominal ultrasound, urine nicotine (on down the road).  Mary Sella. Andrey Campanile, MD, FACS General, Bariatric, & Minimally Invasive Surgery [facility], [place]         [facility] M 04/29/2013, 1:27 PM

## 2013-04-29 NOTE — Progress Notes (Signed)
Quick Note:  I tried to reach pt by phone, no answer. I did release results in my chart. ______

## 2013-04-29 NOTE — Telephone Encounter (Signed)
Please switch her inhaler to Proair and the directions are the same

## 2013-04-29 NOTE — Telephone Encounter (Signed)
I sent script e-scribe. 

## 2013-05-04 NOTE — Progress Notes (Signed)
CGM Training:  Appt start time: 1515 end time:1816.  Assessment:  Primary concerns today: Patient here for initiation of Medtronic Continuous Glucose Monitoring on 530G pump with Enlite sensor  Medications: see list     Intervention:   Understanding Glucose Sensing Programming Sensor Information  High Glucose: 200 mg/dl  Low Glucose 80 mg/dl  Low Repeat: 20 minutes  High Repeat: 2.0 hours  Other settings to be added at follow up visit Starting [employer] of Product  Entering BG, Calibration Technique and Graphs with Public librarian and Alarms  Follow Up Patient to see MD for insulin dose adjustments and to schedule visit with me for CGM follow up within 1-2 week(s).

## 2013-05-06 ENCOUNTER — Other Ambulatory Visit (INDEPENDENT_AMBULATORY_CARE_PROVIDER_SITE_OTHER): Payer: Self-pay | Admitting: General Surgery

## 2013-05-06 ENCOUNTER — Ambulatory Visit (HOSPITAL_COMMUNITY)
Admission: RE | Admit: 2013-05-06 | Discharge: 2013-05-06 | Disposition: A | Discharge status: Home / Self Care | Payer: BC Managed Care – PPO | Source: Ambulatory Visit | Attending: General Surgery | Admitting: General Surgery

## 2013-05-06 ENCOUNTER — Encounter (HOSPITAL_COMMUNITY): Payer: Self-pay

## 2013-05-06 ENCOUNTER — Other Ambulatory Visit: Payer: Self-pay

## 2013-05-06 DIAGNOSIS — I10 Essential (primary) hypertension: Secondary | ICD-10-CM | POA: Insufficient documentation

## 2013-05-06 DIAGNOSIS — F329 Major depressive disorder, single episode, unspecified: Secondary | ICD-10-CM | POA: Insufficient documentation

## 2013-05-06 DIAGNOSIS — I998 Other disorder of circulatory system: Secondary | ICD-10-CM | POA: Insufficient documentation

## 2013-05-06 DIAGNOSIS — Z6841 Body Mass Index (BMI) 40.0 and over, adult: Secondary | ICD-10-CM | POA: Insufficient documentation

## 2013-05-06 DIAGNOSIS — E119 Type 2 diabetes mellitus without complications: Secondary | ICD-10-CM | POA: Insufficient documentation

## 2013-05-06 DIAGNOSIS — K219 Gastro-esophageal reflux disease without esophagitis: Secondary | ICD-10-CM | POA: Insufficient documentation

## 2013-05-06 DIAGNOSIS — F17200 Nicotine dependence, unspecified, uncomplicated: Secondary | ICD-10-CM | POA: Insufficient documentation

## 2013-05-06 DIAGNOSIS — E039 Hypothyroidism, unspecified: Secondary | ICD-10-CM | POA: Insufficient documentation

## 2013-05-06 DIAGNOSIS — F3289 Other specified depressive episodes: Secondary | ICD-10-CM | POA: Insufficient documentation

## 2013-05-06 DIAGNOSIS — K449 Diaphragmatic hernia without obstruction or gangrene: Secondary | ICD-10-CM | POA: Insufficient documentation

## 2013-05-13 ENCOUNTER — Encounter: Payer: BC Managed Care – PPO | Attending: Endocrinology | Admitting: *Deleted

## 2013-05-13 ENCOUNTER — Encounter: Payer: Self-pay | Admitting: *Deleted

## 2013-05-13 VITALS — Ht 61.5 in | Wt 255.0 lb

## 2013-05-13 DIAGNOSIS — Z713 Dietary counseling and surveillance: Secondary | ICD-10-CM | POA: Insufficient documentation

## 2013-05-13 DIAGNOSIS — E669 Obesity, unspecified: Secondary | ICD-10-CM | POA: Insufficient documentation

## 2013-05-13 NOTE — Progress Notes (Addendum)
  Pre-Op Assessment Visit:  Pre-Operative RYGB Surgery  Medical Nutrition Therapy:  Appt start time: 0800   End time:  0900.  Patient was seen on 05/13/2013 for Pre-Operative RYGB Nutrition Assessment. Assessment and letter of approval faxed to [facility] B[facility] Program coordinator on 05/13/2013.  Approval letter sent to [facility] Scan center and will be available in the chart under the media tab.  TANITA  BODY COMP RESULTS  05/13/13   BMI (kg/m^2) 47.4   Fat Mass (lbs) 125.5   Fat Free Mass (lbs) 129.5   Total Body Water (lbs) 95.0   Handouts given during visit include:  Pre-Op Goals   Bariatric Surgery Protein Shakes  Samples given during visit include:   Premier Protein Shake - 1 bottle Lot # [id number];  Exp: 11/11/13   Unjury Protein Powder:  Lot: [id number]; Exp: 09/15 - 1 pkt Lot: [id number]; Exp: 09/15 - 1 pkt Lot: [id number]; Exp: 09/15 - 1 pkt Lot: [id number]; Exp: 09/15 - 2 pkts  Patient to call for Pre-Op and Post-Op Nutrition Education at [facility] when surgery is scheduled.

## 2013-05-13 NOTE — Patient Instructions (Addendum)
   Follow Pre-Op Nutrition Goals to prepare for Gastric Bypass Surgery.   Call the [facility] at [phone] once you have been given your surgery date to enrolled in the Pre-Op Nutrition Class. You will need to attend this nutrition class 3-4 weeks prior to your surgery. 

## 2013-05-20 ENCOUNTER — Other Ambulatory Visit: Payer: Self-pay | Admitting: Endocrinology

## 2013-05-20 NOTE — Telephone Encounter (Signed)
Please advise rx for metformin and Actos? Patient requests 90 day supply.

## 2013-05-20 NOTE — Telephone Encounter (Signed)
Please refer crestor request to dr fry Please decline actos and metformin requests, as pt is on insulin now.

## 2013-05-22 ENCOUNTER — Telehealth: Payer: Self-pay | Admitting: Endocrinology

## 2013-05-22 NOTE — Telephone Encounter (Signed)
Pt is very upset she is out of medicine she has been calling since Monday for her med refill she has gone to the Pharm everyday and still dont' have it, she said her blood sugar is being affected and would like her RX to be refilled ASAP

## 2013-05-26 ENCOUNTER — Telehealth: Payer: Self-pay | Admitting: Endocrinology

## 2013-05-26 NOTE — Telephone Encounter (Signed)
i read this and the 7/24 message.  Pt needs to tell us what med she needs to refill.

## 2013-05-26 NOTE — Telephone Encounter (Signed)
Pt has called again, she still does not know what to do with meds. Wanted an appt w/ Everardo All but can not wait 2 more week. Pt is frustrated. Wants to change care to Dr. Elvera Lennox.

## 2013-05-28 ENCOUNTER — Encounter: Payer: Self-pay | Admitting: Internal Medicine

## 2013-05-28 NOTE — Telephone Encounter (Signed)
Please refill metformin prn

## 2013-05-28 NOTE — Telephone Encounter (Signed)
Pt request to speak with Dr. Everardo All directly concerning why she was taken off Metformin, even though she is on the pump, pt feels she needs Metformin and states she has been on this med for years, pt advised your were out of the office, pt states it would be best to speak with you directly, please call pt at [phone]

## 2013-05-29 ENCOUNTER — Other Ambulatory Visit: Payer: Self-pay

## 2013-05-29 MED ORDER — METFORMIN HCL 1000 MG PO TABS: 1000.0000 mg | ORAL_TABLET | Freq: Two times a day (BID) | ORAL | Status: DC

## 2013-06-09 ENCOUNTER — Other Ambulatory Visit: Payer: Self-pay | Admitting: Family Medicine

## 2013-06-09 ENCOUNTER — Other Ambulatory Visit: Payer: Self-pay | Admitting: Endocrinology

## 2013-06-10 ENCOUNTER — Other Ambulatory Visit: Payer: Self-pay | Admitting: *Deleted

## 2013-06-10 MED ORDER — PIOGLITAZONE HCL 45 MG PO TABS: 45.0000 mg | ORAL_TABLET | Freq: Every day | ORAL | Status: DC

## 2013-06-14 ENCOUNTER — Other Ambulatory Visit: Payer: Self-pay | Admitting: Endocrinology

## 2013-07-01 ENCOUNTER — Encounter: Payer: Self-pay | Admitting: Endocrinology

## 2013-07-01 ENCOUNTER — Other Ambulatory Visit: Payer: Self-pay | Admitting: Family Medicine

## 2013-07-01 ENCOUNTER — Ambulatory Visit (INDEPENDENT_AMBULATORY_CARE_PROVIDER_SITE_OTHER): Payer: BC Managed Care – PPO | Admitting: Endocrinology

## 2013-07-01 VITALS — BP 120/70 | HR 90 | Ht 63.0 in | Wt 241.0 lb

## 2013-07-01 DIAGNOSIS — E1039 Type 1 diabetes mellitus with other diabetic ophthalmic complication: Secondary | ICD-10-CM

## 2013-07-01 NOTE — Patient Instructions (Addendum)
Please continue your basal rate of 1 unit/hr, except 3 units/hr, 3 AM-6 AM.  continue mealtime bolus of 1 unit/6 grams carbohydrate.  However, take an extra 5 units with your lunch bolus.   continue correction bolus (which some people call "sensitivity," or "insulin sensitivity ratio," or just "isr") to 1 unit for each 20 by which your glucose exceeds 100. Please come back for a follow-up appointment in 3 months.  check your blood sugar 4 times a day--before the 3 meals, and at bedtime.  also check if you have symptoms of your blood sugar being too high or too low.  please keep a record of the readings and bring it to your next appointment here.  please call us sooner if your blood sugar goes below 70, or if you have a lot of readings over 200.

## 2013-07-01 NOTE — Progress Notes (Signed)
Subjective:    Patient ID: Miranda [name], female    DOB: 04/17/1976, 37 y.o.   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  She takes a total of approx 100 units of insulin total per day, via her pump.  i have downloaded and reviewed date from her continuous glucose monitor.  There is no trend throughout the day, except it goes higher after lunch.  She has hypoglycemia 1-2/month (no particular time of day or situation).  She says her basal rate has been reduced to 1 unit per hr. Past Medical History  Diagnosis Date  . Hypothyroidism     sees Dr. Leslie Dales  . GERD (gastroesophageal reflux disease)   . Hyperlipidemia   . Hypertension   . Migraine syndrome   . Neuropathy associated with endocrine disorder   . Asthma   . Neck pain   . Insomnia   . Depression     sees Dr. Clayborn Heron in [place]   . Diabetes mellitus     type 2, on insulin pump, sees Dr. Everardo All   . Morbid obesity     Past Surgical History  Procedure Laterality Date  . Blocked itestinal repair    . Hernia repair    . Cholecystectomy      History   Social History  . Marital Status: Married    Spouse Name: N/A    Number of Children: N/A  . Years of Education: N/A   Occupational History  . Not on file.   Social History Main Topics  . Smoking status: Former Smoker -- 1.00 packs/day    Types: Cigarettes    Quit date: 04/27/2013  . Smokeless tobacco: Never Used     Comment: less than 1 pack per day  . Alcohol Use: No  . Drug Use: No  . Sexual Activity: Not on file   Other Topics Concern  . Not on file   Social History Narrative  . No narrative on file    Current  Outpatient Prescriptions on File Prior to Visit  Medication Sig Dispense Refill  . albuterol (PROAIR HFA) 108 (90 BASE) MCG/ACT inhaler Inhale 2 puffs into the lungs every 4 (four) hours as needed for wheezing.  8.5 g  11  . budesonide-formoterol (SYMBICORT) 160-4.5 MCG/ACT inhaler Inhale 2 puffs into the lungs 2 (two) times daily.  30.4 g  3  . buPROPion (WELLBUTRIN XL) 300 MG 24 hr tablet Take 300 mg by mouth daily.      . cetirizine (ZYRTEC) 10 MG tablet Take 10 mg by mouth daily.        . CRESTOR 40 MG tablet TAKE 1 TABLET (40 MG TOTAL) BY MOUTH DAILY.  30 tablet  2  . cyclobenzaprine (FLEXERIL) 10 MG tablet Take 1 tablet (10 mg total) by mouth 3 (three) times daily as needed for muscle spasms.  90 tablet  3  . cycloSPORINE (RESTASIS) 0.05 % ophthalmic emulsion 1 drop 2 (two) times daily.      . furosemide (LASIX) 20 MG tablet Take 1 tablet (20 mg total) by mouth daily.  90 tablet  3  . glucose blood (BAYER CONTOUR NEXT TEST) test  strip 1 each by Other route 4 (four) times daily. And lancets 4/day  120 each  12  . HYDROcodone-acetaminophen (NORCO/VICODIN) 5-325 MG per tablet Take 1 tablet by mouth every 6 (six) hours as needed for pain.  60 tablet  2  . hydrOXYzine (VISTARIL) 50 MG capsule Take 1 tablet by mouth daily as needed.       . hyoscyamine (LEVSIN) 0.125 MG/5ML ELIX Take 0.125 mg by mouth as needed.        . insulin aspart (NOVOLOG) 100 UNIT/ML injection Per insulin pump  1 vial  12  . Insulin Infusion Pump (ONETOUCH PING INSULIN PUMP) KIT by Does not apply route. One device      . Insulin Infusion Pump Supplies (INSET INFUSION SET 23" ) MISC 1 Device by Does not apply route every 3 (three) days.      . Insulin Infusion Pump Supplies (INSULIN PUMP SYRINGE RESERVOIR) MISC 1 Device by Does not apply route 3 (three) times daily with meals as needed.  30 each  3  . levothyroxine (SYNTHROID, LEVOTHROID) 125 MCG tablet Take 1 tablet (125 mcg total) by mouth daily.  90 tablet  3  .  metFORMIN (GLUCOPHAGE) 1000 MG tablet Take 1 tablet (1,000 mg total) by mouth 2 (two) times daily with a meal.  60 tablet  3  . metoprolol succinate (TOPROL-XL) 25 MG 24 hr tablet       . Multiple Vitamin (MULTIVITAMIN) tablet Take 1 tablet by mouth daily.        Marland Kitchen omeprazole (PRILOSEC) 20 MG capsule Take 20 mg by mouth daily.       . pioglitazone (ACTOS) 45 MG tablet TAKE 1 TABLET ONCE A DAY ORALLY  90 tablet  4  . pioglitazone (ACTOS) 45 MG tablet Take 1 tablet (45 mg total) by mouth daily.  90 tablet  3  . potassium chloride SA (KLOR-CON M20) 20 MEQ tablet Take 1 tablet (20 mEq total) by mouth daily.  90 tablet  3  . prazosin (MINIPRESS) 5 MG capsule Take 5 mg by mouth at bedtime. Take 2      . promethazine (PHENERGAN) 25 MG tablet Take 25 mg by mouth as needed.        . risperiDONE (RISPERDAL) 2 MG tablet Take 2 mg by mouth daily.       . rizatriptan (MAXALT) 10 MG tablet Take 1 tablet (10 mg total) by mouth as needed for migraine.  10 tablet  11  . sertraline (ZOLOFT) 100 MG tablet Take 100 mg by mouth daily. Take 2 every day      . topiramate (TOPAMAX) 50 MG tablet Take 2 tablets (100 mg total) by mouth daily.  2 tablet  0  . valACYclovir (VALTREX) 1000 MG tablet Take 1 tablet (1,000 mg total) by mouth 2 (two) times daily.  20 tablet  0   No current facility-administered medications on file prior to visit.    Allergies  Allergen Reactions  . Exenatide     hives  . Lortab [Hydrocodone-Acetaminophen]   . Penicillins   . Tramadol Hcl   . Avocado Diarrhea and Other (See Comments)    Severe cramping, sweats, and diarrhea in upper GI/stomach    No family history on file.  BP 120/70  Pulse 90  Ht 5\' 3"  (1.6 m)  Wt 241 lb (109.317 kg)  BMI 42.7 kg/m2  SpO2 98%  Review of Systems Denies LOC.  She has lost weight, due to her efforts.  Objective:   Physical Exam VITAL SIGNS:  See vs page GENERAL: no distress  Lab Results  Component Value Date   HGBA1C 7.4* 07/01/2013       Assessment & Plan:  DM: Based on the pattern of her cbg's, she needs some adjustment in her therapy.  This insulin pump regimen was chosen from multiple options, as it best matches his insulin to his changing requirements throughout the day.  The benefits of glycemic control must be weighed against the risks of hypoglycemia.   PCOS: it is ok to take the metformin for this.   NASH: it ios ok for her to continue the actos for this.

## 2013-08-06 ENCOUNTER — Telehealth: Payer: Self-pay | Admitting: Family Medicine

## 2013-08-06 NOTE — Telephone Encounter (Addendum)
Pt needs new rx hydrocodone for plantar fascitis. Pt will make appt if necessary

## 2013-08-07 NOTE — Telephone Encounter (Signed)
She needs an OV 

## 2013-08-07 NOTE — Telephone Encounter (Signed)
appt sch °

## 2013-08-08 ENCOUNTER — Encounter: Payer: Self-pay | Admitting: Family Medicine

## 2013-08-08 ENCOUNTER — Ambulatory Visit (INDEPENDENT_AMBULATORY_CARE_PROVIDER_SITE_OTHER): Payer: BC Managed Care – PPO | Admitting: Family Medicine

## 2013-08-08 ENCOUNTER — Ambulatory Visit: Payer: BC Managed Care – PPO

## 2013-08-08 VITALS — BP 110/72 | Temp 98.7°F | Wt 246.0 lb

## 2013-08-08 DIAGNOSIS — M722 Plantar fascial fibromatosis: Secondary | ICD-10-CM

## 2013-08-08 DIAGNOSIS — Z23 Encounter for immunization: Secondary | ICD-10-CM

## 2013-08-08 MED ORDER — DICLOFENAC SODIUM 75 MG PO TBEC: 75.0000 mg | DELAYED_RELEASE_TABLET | Freq: Two times a day (BID) | ORAL | Status: DC

## 2013-08-08 MED ORDER — HYDROCODONE-ACETAMINOPHEN 5-325 MG PO TABS: 1.0000 | ORAL_TABLET | Freq: Four times a day (QID) | ORAL | Status: DC | PRN

## 2013-08-08 NOTE — Progress Notes (Signed)
  Subjective:    Patient ID: Miranda [name], female    DOB: [birth year], 37 y.o.   MRN: [id number]  HPI Here for med refills. She has had plantar fasciitis in the left foot for several years, and she had been seeing Dr. Lajoyce Corners for this. He recommended surgery but she is not ready to do this yet. She uses Ibuprofen or Vicodin about 3-4 days a week.    Review of Systems  Constitutional: Negative.   Musculoskeletal: Positive for arthralgias.       Objective:   Physical Exam  Constitutional: She appears well-developed and well-nourished.          Assessment & Plan:  Use Diclofenac bid for typical pain and use Vicodin for severe pain. She already uses stretches, ice packs, etc.

## 2013-08-27 ENCOUNTER — Encounter: Payer: Self-pay | Admitting: Endocrinology

## 2013-09-17 ENCOUNTER — Other Ambulatory Visit: Payer: Self-pay | Admitting: *Deleted

## 2013-09-17 ENCOUNTER — Other Ambulatory Visit: Payer: Self-pay | Admitting: Endocrinology

## 2013-09-17 MED ORDER — ROSUVASTATIN CALCIUM 40 MG PO TABS: 40.0000 mg | ORAL_TABLET | Freq: Every day | ORAL | Status: DC

## 2013-10-02 ENCOUNTER — Ambulatory Visit: Payer: BC Managed Care – PPO | Admitting: Endocrinology

## 2013-10-03 ENCOUNTER — Ambulatory Visit: Payer: BC Managed Care – PPO | Admitting: Endocrinology

## 2013-10-03 DIAGNOSIS — Z0289 Encounter for other administrative examinations: Secondary | ICD-10-CM

## 2013-10-06 ENCOUNTER — Telehealth: Payer: Self-pay

## 2013-10-06 NOTE — Telephone Encounter (Signed)
Request came from pharmacy requesting a refill for humalog. Medication was D/C on 04/28/2013. Ok to refill?  Thanks!

## 2013-10-06 NOTE — Telephone Encounter (Signed)
It was changed to [employer], prob for insurance reasons

## 2013-10-09 ENCOUNTER — Telehealth: Payer: Self-pay

## 2013-10-09 NOTE — Telephone Encounter (Signed)
It was changed to novolog, prob for insurance reasons 

## 2013-10-09 NOTE — Telephone Encounter (Signed)
Pharmacy requesting refill on humalog. Medication was D/C on 04/28/2013.   Ok to refill?  Thanks!

## 2013-10-13 ENCOUNTER — Other Ambulatory Visit: Payer: Self-pay | Admitting: Obstetrics and Gynecology

## 2013-10-15 ENCOUNTER — Encounter: Payer: Self-pay | Admitting: Endocrinology

## 2013-10-15 ENCOUNTER — Ambulatory Visit (INDEPENDENT_AMBULATORY_CARE_PROVIDER_SITE_OTHER): Payer: BC Managed Care – PPO | Admitting: Endocrinology

## 2013-10-15 ENCOUNTER — Telehealth: Payer: Self-pay

## 2013-10-15 VITALS — BP 116/68 | HR 101 | Temp 97.8°F | Ht 63.0 in | Wt 257.0 lb

## 2013-10-15 DIAGNOSIS — E1039 Type 1 diabetes mellitus with other diabetic ophthalmic complication: Secondary | ICD-10-CM

## 2013-10-15 MED ORDER — INSULIN ASPART 100 UNIT/ML ~~LOC~~ SOLN: SUBCUTANEOUS | Status: DC

## 2013-10-15 NOTE — Telephone Encounter (Signed)
i have sent to pharmacy, via epic, which i thought was her request.  Now if have printed refill.

## 2013-10-15 NOTE — Progress Notes (Signed)
Subjective:    Patient ID: Miranda [name], female    DOB: 31-May-1976, 37 y.o.   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  She says she takes a total of approx 50 units of insulin total per day, via her pump.  i have downloaded and reviewed data from her continuous glucose monitor.  There is no trend throughout the day.  She seldom has hypoglycemia, and these episodes are mild.  Last months, she increased her basal rate to 2 units/hr. She says her CHO ratio is 1 unit/16 grams.   Past Medical History  Diagnosis Date  . Hypothyroidism     sees Dr. Leslie Dales  . GERD (gastroesophageal reflux disease)   . Hyperlipidemia   . Hypertension   . Migraine syndrome   . Neuropathy associated with endocrine disorder   . Asthma   . Neck pain   . Insomnia   . Depression     sees Dr. Clayborn Heron in [place]   . Diabetes mellitus     type 2, on insulin pump, sees Dr. Everardo All   . Morbid obesity     Past Surgical History  Procedure Laterality Date  . Blocked itestinal repair    . Hernia repair    . Cholecystectomy      History   Social History  . Marital Status: Married    Spouse Name: N/A    Number of Children: N/A  . Years of Education: N/A   Occupational History  . Not on file.   Social History Main Topics  . Smoking status: Former Smoker -- 1.00 packs/day    Types: Cigarettes    Quit date: 04/27/2013  . Smokeless tobacco: Never Used     Comment: less than 1 pack per day  . Alcohol Use: No  . Drug Use: No  . Sexual Activity: Not on file   Other Topics Concern  . Not on file   Social History  Narrative  . No narrative on file    Current Outpatient Prescriptions on File Prior to Visit  Medication Sig Dispense Refill  . albuterol (PROAIR HFA) 108 (90 BASE) MCG/ACT inhaler Inhale 2 puffs into the lungs every 4 (four) hours as needed for wheezing.  8.5 g  11  . budesonide-formoterol (SYMBICORT) 160-4.5 MCG/ACT inhaler Inhale 2 puffs into the lungs 2 (two) times daily.  30.4 g  3  . buPROPion (WELLBUTRIN XL) 300 MG 24 hr tablet Take 300 mg by mouth daily.      . cetirizine (ZYRTEC) 10 MG tablet Take 10 mg by mouth daily.        . cyclobenzaprine (FLEXERIL) 10 MG tablet Take 1 tablet (10 mg total) by mouth 3 (three) times daily as needed for muscle spasms.  90 tablet  3  . cycloSPORINE (RESTASIS) 0.05 % ophthalmic emulsion 1 drop 2 (two) times daily.      . diclofenac (VOLTAREN) 75 MG EC tablet Take 1 tablet (75 mg total) by mouth 2 (two) times daily.  60 tablet  5  . furosemide (LASIX) 20 MG tablet Take 1 tablet (20 mg total) by mouth daily.  90 tablet  3  . glucose blood (BAYER CONTOUR NEXT TEST) test strip 1 each by Other route 4 (four) times daily. And lancets 4/day  120 each  12  . HYDROcodone-acetaminophen (NORCO/VICODIN) 5-325 MG per tablet Take 1 tablet by mouth every 6 (six) hours as needed for pain.  60 tablet  0  . hydrOXYzine (VISTARIL) 50 MG capsule Take 1 tablet by mouth daily as needed.       . hyoscyamine (LEVSIN) 0.125 MG/5ML ELIX Take 0.125 mg by mouth as needed.        . Insulin Infusion Pump Supplies (INSET INFUSION SET 23" ) MISC 1 Device by Does not apply route every 3 (three) days.      . Insulin Infusion Pump Supplies (INSULIN PUMP SYRINGE RESERVOIR) MISC 1 Device by Does not apply route 3 (three) times daily with meals as needed.  30 each  3  . levothyroxine (SYNTHROID, LEVOTHROID) 125 MCG tablet Take 1 tablet (125 mcg total) by mouth daily.  90 tablet  3  . metFORMIN (GLUCOPHAGE) 1000 MG tablet Take 1 tablet (1,000 mg total) by mouth 2 (two) times daily with a  meal.  60 tablet  3  . metoprolol succinate (TOPROL-XL) 25 MG 24 hr tablet       . Multiple Vitamin (MULTIVITAMIN) tablet Take 1 tablet by mouth daily.        . pioglitazone (ACTOS) 45 MG tablet TAKE 1 TABLET ONCE A DAY ORALLY  90 tablet  4  . potassium chloride SA (KLOR-CON M20) 20 MEQ tablet Take 1 tablet (20 mEq total) by mouth daily.  90 tablet  3  . prazosin (MINIPRESS) 5 MG capsule Take 5 mg by mouth at bedtime. Take 2      . promethazine (PHENERGAN) 25 MG tablet Take 25 mg by mouth as needed.        . risperiDONE (RISPERDAL) 2 MG tablet Take 2 mg by mouth daily.       . rizatriptan (MAXALT) 10 MG tablet Take 1 tablet (10 mg total) by mouth as needed for migraine.  10 tablet  11  . rosuvastatin (CRESTOR) 40 MG tablet Take 1 tablet (40 mg total) by mouth daily.  30 tablet  2  . sertraline (ZOLOFT) 100 MG tablet Take 100 mg by mouth daily. Take 2 every day      . valACYclovir (VALTREX) 1000 MG tablet Take 1 tablet (1,000 mg total) by mouth 2 (two) times daily.  20 tablet  0   No current facility-administered medications on file prior to visit.    Allergies  Allergen Reactions  . Exenatide     hives  . Penicillins   . Tramadol Hcl   . Avocado Diarrhea and Other (See Comments)    Severe cramping, sweats, and diarrhea in upper GI/stomach    No family history on file.  BP 116/68  Pulse 101  Temp(Src) 97.8 F (36.6 C) (Oral)  Ht 5\' 3"  (1.6 m)  Wt 257 lb (116.574 kg)  BMI 45.54 kg/m2  SpO2 95%  Review of Systems Denies LOC and weight change.      Objective:   Physical Exam VITAL SIGNS:  See vs page GENERAL: no distress  (pt says her a1c last week was 8.4%)    Assessment & Plan:  DM: she needs increased rx.  This insulin pump regimen was chosen from multiple options, as it best matches his insulin to his changing requirements throughout the day.  The benefits of glycemic  control must be weighed against the risks of hypoglycemia.   Morbid obesity: this complicates the rx  of DM.

## 2013-10-15 NOTE — Patient Instructions (Addendum)
Please take a basal rate of 1.5 unit/hr, except 3 units/hr, 3 AM-6 AM.   Increase your mealtime bolus of 1 unit/10 grams carbohydrate.  However, take an extra 5 units with your lunch bolus.   continue correction bolus (which some people call "sensitivity," or "insulin sensitivity ratio," or just "isr") to 1 unit for each 20 by which your glucose exceeds 100. Please come back for a follow-up appointment in 3 months.  check your blood sugar 4 times a day--before the 3 meals, and at bedtime.  also check if you have symptoms of your blood sugar being too high or too low.  please keep a record of the readings and bring it to your next appointment here.  please call us sooner if your blood sugar goes below 70, or if you have a lot of readings over 200.

## 2013-10-15 NOTE — Telephone Encounter (Signed)
Patient was seen today and was printed out a script for novolog. Patient states that script was not given to her.  Please advise,  Thanks!

## 2013-10-16 MED ORDER — TOPIRAMATE 50 MG PO TABS: 100.0000 mg | ORAL_TABLET | Freq: Every day | ORAL | Status: DC

## 2013-10-16 MED ORDER — INSULIN ASPART 100 UNIT/ML ~~LOC~~ SOLN: SUBCUTANEOUS | Status: DC

## 2013-10-16 MED ORDER — OMEPRAZOLE 20 MG PO CPDR: 20.0000 mg | DELAYED_RELEASE_CAPSULE | Freq: Every day | ORAL | Status: DC

## 2013-10-16 NOTE — Telephone Encounter (Signed)
Is there an covered alternative?

## 2013-10-16 NOTE — Telephone Encounter (Signed)
Patient called stating insulin was not at pharmacy. Resent order to pharmacy.

## 2013-10-16 NOTE — Telephone Encounter (Signed)
Printed preferred list for [employer]. Put on desk to review in the morning.

## 2013-10-16 NOTE — Telephone Encounter (Signed)
PA received for Novolog.

## 2013-10-17 ENCOUNTER — Telehealth: Payer: Self-pay | Admitting: Family Medicine

## 2013-10-17 DIAGNOSIS — Z0279 Encounter for issue of other medical certificate: Secondary | ICD-10-CM

## 2013-10-17 NOTE — Telephone Encounter (Signed)
Patient called stating that she has contacted her insurance company regarding insulin. Pt stated that once PA was sent it would be approved.  Pt wanted Dr to know she had spoken with them.

## 2013-10-17 NOTE — Telephone Encounter (Signed)
Pt called to asked dr fry advise. Pt is having issues w/ getting her insulin. Her insulin requires a prior auth. Pt has called her insurance co and they say they will approve as soon as they get info back from the doctor. -pt is out of insulin and doesn't know what to do. Pt would like to know if there's anything dr fry can do. Her BC running 200-400. Advised pt dr fry out of office this afternoon.

## 2013-10-17 NOTE — Telephone Encounter (Signed)
I spoke with pt and she did get Dr. George Hugh office to handle this and they also have samples for the pt to pick up.

## 2013-10-17 NOTE — Telephone Encounter (Signed)
Please call third-party help desk.  Why do they want PA, when novolog says tier 2?

## 2013-10-17 NOTE — Telephone Encounter (Signed)
Patient states that she is completely out of insulin. Please advise, Thanks!

## 2013-10-17 NOTE — Telephone Encounter (Signed)
done

## 2013-10-17 NOTE — Telephone Encounter (Signed)
Called help desk help desk personal that I spoke with said that they were not able to see all of the plans for [employer] of . Stated that when form is sent in the would be able to view it and approve it that way. Sated that the tier did not matter cause they could not view the plan. Received PA form.

## 2013-10-20 ENCOUNTER — Telehealth: Payer: Self-pay | Admitting: Family Medicine

## 2013-10-20 DIAGNOSIS — E119 Type 2 diabetes mellitus without complications: Secondary | ICD-10-CM

## 2013-10-20 NOTE — Telephone Encounter (Signed)
Patient stated she left a message @[facility] on 10/20/2013, they stated she needs a referral. She prefers Dr. [facility].

## 2013-10-21 NOTE — Telephone Encounter (Signed)
The referral was done  

## 2013-10-22 NOTE — Telephone Encounter (Signed)
I spoke with pt  

## 2013-11-06 ENCOUNTER — Other Ambulatory Visit: Payer: Self-pay | Admitting: Family Medicine

## 2013-12-08 ENCOUNTER — Telehealth: Payer: Self-pay | Admitting: Family Medicine

## 2013-12-08 NOTE — Telephone Encounter (Signed)
I understand. I suggest she keep seeing Dr. Loanne Drilling

## 2013-12-08 NOTE — Telephone Encounter (Signed)
Miranda [name] calling from Dr. Cindra Eves office to inform PCP that Dr. Buddy Duty is unable to accept referral to see pt because pt has been discharged from [facility].

## 2013-12-08 NOTE — Telephone Encounter (Signed)
I spoke with pt and if she needs another type of referral, then she will call us back.

## 2013-12-23 ENCOUNTER — Encounter: Payer: Self-pay | Admitting: Endocrinology

## 2013-12-23 ENCOUNTER — Ambulatory Visit: Payer: BC Managed Care – PPO | Admitting: Endocrinology

## 2013-12-23 ENCOUNTER — Ambulatory Visit (INDEPENDENT_AMBULATORY_CARE_PROVIDER_SITE_OTHER): Payer: BC Managed Care – PPO | Admitting: Endocrinology

## 2013-12-23 VITALS — BP 122/70 | HR 125 | Temp 97.9°F | Ht 63.0 in | Wt 266.0 lb

## 2013-12-23 DIAGNOSIS — E1039 Type 1 diabetes mellitus with other diabetic ophthalmic complication: Secondary | ICD-10-CM

## 2013-12-23 LAB — HEMOGLOBIN A1C: HEMOGLOBIN A1C: 9.1 % — AB (ref 4.6–6.5)

## 2013-12-23 MED ORDER — INSULIN ASPART 100 UNIT/ML ~~LOC~~ SOLN: SUBCUTANEOUS | Status: DC

## 2013-12-23 NOTE — Patient Instructions (Addendum)
A diabetes blood test is requested for you today.  We'll contact you with results. Please take a basal rate of 1.5 unit/hr, except 3 units/hr, 3 AM-6 AM.   Increase your mealtime bolus to 1 unit/7 grams carbohydrate.  However, take an extra 5 units with your lunch bolus.   continue correction bolus (which some people call "sensitivity," or "insulin sensitivity ratio," or just "isr") to 1 unit for each 20 by which your glucose exceeds 100. Please come back for a follow-up appointment in 3 months.  check your blood sugar 4 times a day--before the 3 meals, and at bedtime.  also check if you have symptoms of your blood sugar being too high or too low.  please keep a record of the readings and bring it to your next appointment here.  please call us sooner if your blood sugar goes below 70, or if you have a lot of readings over 200.

## 2013-12-23 NOTE — Progress Notes (Signed)
Subjective:    Patient ID: Miranda [name], female    DOB: Jul 04, 1976, 38 y.o.   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  She says she takes a total of approx 100 units of insulin total per day, via her pump.  i have downloaded and reviewed data from her continuous glucose monitor.  She is unable to cite precip factor for this deterioration in her control.  pt states she feels well in general, except for shoulder pain.   Past Medical History  Diagnosis Date  . Hypothyroidism     sees Dr. Elyse Hsu  . GERD (gastroesophageal reflux disease)   . Hyperlipidemia   . Hypertension   . Migraine syndrome   . Neuropathy associated with endocrine disorder   . Asthma   . Neck pain   . Insomnia   . Depression     sees Dr. Matilde Haymaker in [place]   . Diabetes mellitus     type 2, on insulin pump, sees Dr. Loanne Drilling   . Morbid obesity     Past Surgical History  Procedure Laterality Date  . Blocked itestinal repair    . Hernia repair    . Cholecystectomy      History   Social History  . Marital Status: Married    Spouse Name: N/A    Number of Children: N/A  . Years of Education: N/A   Occupational History  . Not on file.   Social History Main Topics  . Smoking status: Former Smoker -- 1.00 packs/day    Types: Cigarettes    Quit date: 04/27/2013  . Smokeless tobacco: Never Used     Comment: less than 1 pack per day  . Alcohol Use: No  . Drug Use: No  . Sexual Activity: Not on file   Other Topics Concern  . Not on file   Social History Narrative  . No narrative on file    Current Outpatient  Prescriptions on File Prior to Visit  Medication Sig Dispense Refill  . albuterol (PROAIR HFA) 108 (90 BASE) MCG/ACT inhaler Inhale 2 puffs into the lungs every 4 (four) hours as needed for wheezing.  8.5 g  11  . budesonide-formoterol (SYMBICORT) 160-4.5 MCG/ACT inhaler Inhale 2 puffs into the lungs 2 (two) times daily.  30.4 g  3  . buPROPion (WELLBUTRIN XL) 300 MG 24 hr tablet Take 300 mg by mouth daily.      . cetirizine (ZYRTEC) 10 MG tablet Take 10 mg by mouth daily.        . cyclobenzaprine (FLEXERIL) 10 MG tablet Take 1 tablet (10 mg total) by mouth 3 (three) times daily as needed for muscle spasms.  90 tablet  3  . cycloSPORINE (RESTASIS) 0.05 % ophthalmic emulsion 1 drop 2 (two) times daily.      . diclofenac (VOLTAREN) 75 MG EC tablet Take 1 tablet (75 mg total) by mouth 2 (two) times daily.  60 tablet  5  . glucose blood (BAYER CONTOUR NEXT TEST) test strip 1 each by Other route 4 (four) times daily. And lancets 4/day  120 each  12  .  HYDROcodone-acetaminophen (NORCO/VICODIN) 5-325 MG per tablet Take 1 tablet by mouth every 6 (six) hours as needed for pain.  60 tablet  0  . hydrOXYzine (VISTARIL) 50 MG capsule Take 1 tablet by mouth daily as needed.       . hyoscyamine (LEVSIN) 0.125 MG/5ML ELIX Take 0.125 mg by mouth as needed.        . Insulin Infusion Pump Supplies (INSET INFUSION SET 23" 9MM) MISC 1 Device by Does not apply route every 3 (three) days.      . Insulin Infusion Pump Supplies (INSULIN PUMP SYRINGE RESERVOIR) MISC 1 Device by Does not apply route 3 (three) times daily with meals as needed.  30 each  3  . levothyroxine (SYNTHROID, LEVOTHROID) 125 MCG tablet Take 1 tablet (125 mcg total) by mouth daily.  90 tablet  3  . metFORMIN (GLUCOPHAGE) 1000 MG tablet Take 1 tablet (1,000 mg total) by mouth 2 (two) times daily with a meal.  60 tablet  3  . metoprolol succinate (TOPROL-XL) 25 MG 24 hr tablet       . metoprolol succinate (TOPROL-XL) 25 MG 24 hr tablet TAKE 1 TABLET BY  MOUTH EVERY DAY  30 tablet  3  . Multiple Vitamin (MULTIVITAMIN) tablet Take 1 tablet by mouth daily.        Marland Kitchen omeprazole (PRILOSEC) 20 MG capsule Take 1 capsule (20 mg total) by mouth daily.  90 capsule  0  . pioglitazone (ACTOS) 45 MG tablet TAKE 1 TABLET ONCE A DAY ORALLY  90 tablet  4  . potassium chloride SA (KLOR-CON M20) 20 MEQ tablet Take 1 tablet (20 mEq total) by mouth daily.  90 tablet  3  . prazosin (MINIPRESS) 5 MG capsule Take 5 mg by mouth at bedtime. Take 2      . promethazine (PHENERGAN) 25 MG tablet Take 25 mg by mouth as needed.        . risperiDONE (RISPERDAL) 2 MG tablet Take 2 mg by mouth daily.       . rizatriptan (MAXALT) 10 MG tablet Take 1 tablet (10 mg total) by mouth as needed for migraine.  10 tablet  11  . rosuvastatin (CRESTOR) 40 MG tablet Take 1 tablet (40 mg total) by mouth daily.  30 tablet  2  . sertraline (ZOLOFT) 100 MG tablet Take 100 mg by mouth daily. Take 2 every day      . topiramate (TOPAMAX) 50 MG tablet Take 2 tablets (100 mg total) by mouth daily.  180 tablet  0  . valACYclovir (VALTREX) 1000 MG tablet Take 1 tablet (1,000 mg total) by mouth 2 (two) times daily.  20 tablet  0  . furosemide (LASIX) 20 MG tablet Take 1 tablet (20 mg total) by mouth daily.  90 tablet  3   No current facility-administered medications on file prior to visit.    Allergies  Allergen Reactions  . Exenatide     hives  . Penicillins   . Tramadol Hcl   . Avocado Diarrhea and Other (See Comments)    Severe cramping, sweats, and diarrhea in upper GI/stomach    No family history on file.  BP 122/70  Pulse 125  Temp(Src) 97.9 F (36.6 C) (Oral)  Ht 5\' 3"  (1.6 m)  Wt 266 lb (120.657 kg)  BMI 47.13 kg/m2  SpO2 95%  Review of Systems denies hypoglycemia.  She has weight gain.    Objective:   Physical Exam VITAL SIGNS:  See vs page  GENERAL: no distress SKIN:  Insulin injection and continuous glucose monitor sites at the anterior abdomen are normal  Lab  Results  Component Value Date   HGBA1C 9.1* 12/23/2013      Assessment & Plan:  DM: reason for deterioration i her control is uncertain.  This insulin pump regimen was chosen from multiple options, as it best matches his insulin to his changing requirements throughout the day.  The benefits of glycemic control must be weighed against the risks of hypoglycemia.   Morbid obesity: this complicates the rx of DM.

## 2013-12-29 ENCOUNTER — Emergency Department (HOSPITAL_COMMUNITY)
Admission: EM | Admit: 2013-12-29 | Discharge: 2013-12-29 | Disposition: A | Discharge status: Home / Self Care | Payer: BC Managed Care – PPO | Source: Home / Self Care | Attending: Emergency Medicine | Admitting: Emergency Medicine

## 2013-12-29 ENCOUNTER — Encounter (HOSPITAL_COMMUNITY): Payer: Self-pay | Admitting: Emergency Medicine

## 2013-12-29 ENCOUNTER — Telehealth: Payer: Self-pay | Admitting: Family Medicine

## 2013-12-29 DIAGNOSIS — E860 Dehydration: Secondary | ICD-10-CM

## 2013-12-29 DIAGNOSIS — K529 Noninfective gastroenteritis and colitis, unspecified: Secondary | ICD-10-CM

## 2013-12-29 DIAGNOSIS — K5289 Other specified noninfective gastroenteritis and colitis: Secondary | ICD-10-CM

## 2013-12-29 LAB — CBC WITH DIFFERENTIAL/PLATELET
BASOS ABS: 0 10*3/uL (ref 0.0–0.1)
Basophils Relative: 1 % (ref 0–1)
EOS ABS: 0.1 10*3/uL (ref 0.0–0.7)
EOS PCT: 1 % (ref 0–5)
HCT: 41.4 % (ref 36.0–46.0)
Hemoglobin: 14.6 g/dL (ref 12.0–15.0)
Lymphocytes Relative: 27 % (ref 12–46)
Lymphs Abs: 1.6 10*3/uL (ref 0.7–4.0)
MCH: 31.5 pg (ref 26.0–34.0)
MCHC: 35.3 g/dL (ref 30.0–36.0)
MCV: 89.2 fL (ref 78.0–100.0)
Monocytes Absolute: 0.8 10*3/uL (ref 0.1–1.0)
Monocytes Relative: 13 % — ABNORMAL HIGH (ref 3–12)
Neutro Abs: 3.5 10*3/uL (ref 1.7–7.7)
Neutrophils Relative %: 58 % (ref 43–77)
PLATELETS: 125 10*3/uL — AB (ref 150–400)
RBC: 4.64 MIL/uL (ref 3.87–5.11)
RDW: 13.1 % (ref 11.5–15.5)
WBC: 6 10*3/uL (ref 4.0–10.5)

## 2013-12-29 LAB — POCT I-STAT, CHEM 8
BUN: 6 mg/dL (ref 6–23)
CALCIUM ION: 1.15 mmol/L (ref 1.12–1.23)
CHLORIDE: 103 meq/L (ref 96–112)
Creatinine, Ser: 0.6 mg/dL (ref 0.50–1.10)
GLUCOSE: 195 mg/dL — AB (ref 70–99)
HCT: 44 % (ref 36.0–46.0)
Hemoglobin: 15 g/dL (ref 12.0–15.0)
Potassium: 4.3 mEq/L (ref 3.7–5.3)
Sodium: 139 mEq/L (ref 137–147)
TCO2: 23 mmol/L (ref 0–100)

## 2013-12-29 LAB — CLOSTRIDIUM DIFFICILE BY PCR: Toxigenic C. Difficile by PCR: NEGATIVE

## 2013-12-29 MED ORDER — SODIUM CHLORIDE 0.9 % IV SOLN: INTRAVENOUS | Status: DC

## 2013-12-29 MED ORDER — ONDANSETRON 4 MG PO TBDP
8.0000 mg | ORAL_TABLET | Freq: Once | ORAL | Status: AC
  Administered 2013-12-29: 8 mg via ORAL

## 2013-12-29 MED ORDER — ONDANSETRON 4 MG PO TBDP
ORAL_TABLET | ORAL | Status: AC
  Filled 2013-12-29: qty 2

## 2013-12-29 MED ORDER — DIPHENOXYLATE-ATROPINE 2.5-0.025 MG PO TABS: 1.0000 | ORAL_TABLET | Freq: Four times a day (QID) | ORAL | Status: DC | PRN

## 2013-12-29 MED ORDER — ONDANSETRON 8 MG PO TBDP: 8.0000 mg | ORAL_TABLET | Freq: Three times a day (TID) | ORAL | Status: DC | PRN

## 2013-12-29 NOTE — ED Notes (Signed)
C/o   Vomiting and diarrhea since Saturday.   Fever for the past 48 hours.  Denies any changes in diet.   Denies any other symptoms.  No otc meds tried.

## 2013-12-29 NOTE — ED Provider Notes (Signed)
Chief Complaint   Chief Complaint  Patient presents with  . Emesis  . Diarrhea     History of Present Illness   Miranda [name] is a 38 year old female with diabetes who has had a three-day history of nausea, vomiting, and diarrhea. She's only vomited twice. There's been no blood in the vomitus, no coffee-ground emesis or bilious emesis. The patient estimates that she's had about 50 loose stools without blood or melena. She feels weak and dizzy. She's had a temperature as high as 104.5, and crampy upper abdominal pain. She denies any suspicious ingestions. She's had no sick exposure foreign travel. Her blood sugars have been up and down, but not anything out of the ordinary.  Review of Systems   Other than as noted above, the patient denies any of the following symptoms: Systemic:  No fevers, chills, sweats, weight loss or gain, fatigue, or tiredness. ENT:  No nasal congestion, rhinorrhea, or sore throat. Lungs:  No cough, wheezing, or shortness of breath. Cardiac:  No chest pain, syncope, or presyncope. GI:  No abdominal pain, nausea, vomiting, anorexia, diarrhea, constipation, blood in stool or vomitus. GU:  No dysuria, frequency, or urgency.  [place]   Past medical history, family history, social history, meds, and allergies were reviewed.  She is allergic to penicillin, Lortabs, tramadol, and [place]. She has diabetes, hypercholesterolemia, hypothyroidism, hypercholesterolemia, migraine, neuropathy, asthma, insomnia, and depression. She's on insulin pump and also takes a multitude of medications including Symbicort, Wellbutrin, Zyrtec, Flexeril, Restasis, Voltaren, Lasix, Levsin, Synthroid, metformin, Prilosec, Actos, Klor-Con, Minipress, Phenergan, Risperdal, Maxalt, Crestor, Zoloft, Topamax, Valtrex, Pro-Air, Vistaril, and Toprol.  Physical Exam     Vital signs:  BP 161/106  Pulse 108  Temp(Src) 99 F (37.2 C) (Oral)  Resp 20  SpO2 97% Filed Vitals:   12/29/13 1437  12/29/13 1500 Supine  12/29/13 1501 Sitting  12/29/13 1502 Standing   BP: 144/99 140/89 141/96 161/106  Pulse: 103 82 98 108  Temp: 99 F (37.2 C)     TempSrc: Oral     Resp: 20     SpO2: 97%      General:  Alert and oriented.  In no distress.  Skin warm and dry.  Good skin turgor, brisk capillary refill. ENT:  No scleral icterus, moist mucous membranes, no oral lesions, pharynx clear. Lungs:  Breath sounds clear and equal bilaterally.  No wheezes, rales, or rhonchi. Heart:  Rhythm regular, without extrasystoles.  No gallops or murmers. Abdomen:  Soft, flat, nondistended. No organomegaly or mass. Bowel sounds are normally active. There is mild tenderness to palpation across the entire upper abdomen without guarding or rebound. No organomegaly or mass. No lower abdominal tenderness to palpation. Skin: Clear, warm, and dry.  Good turgor.  Brisk capillary refill.  Labs   Results for orders placed during the hospital encounter of 12/29/13  CBC WITH DIFFERENTIAL      Result Value Ref Range   WBC 6.0  4.0 - 10.5 K/uL   RBC 4.64  3.87 - 5.11 MIL/uL   Hemoglobin 14.6  12.0 - 15.0 g/dL   HCT 41.4  36.0 - 46.0 %   MCV 89.2  78.0 - 100.0 fL   MCH 31.5  26.0 - 34.0 pg   MCHC 35.3  30.0 - 36.0 g/dL   RDW 13.1  11.5 - 15.5 %   Platelets 125 (*) 150 - 400 K/uL   Neutrophils Relative % 58  43 - 77 %   Neutro Abs 3.5  1.7 - 7.7 K/uL   Lymphocytes Relative 27  12 - 46 %   Lymphs Abs 1.6  0.7 - 4.0 K/uL   Monocytes Relative 13 (*) 3 - 12 %   Monocytes Absolute 0.8  0.1 - 1.0 K/uL   Eosinophils Relative 1  0 - 5 %   Eosinophils Absolute 0.1  0.0 - 0.7 K/uL   Basophils Relative 1  0 - 1 %   Basophils Absolute 0.0  0.0 - 0.1 K/uL  POCT I-STAT, CHEM 8      Result Value Ref Range   Sodium 139  137 - 147 mEq/L   Potassium 4.3  3.7 - 5.3 mEq/L   Chloride 103  96 - 112 mEq/L   BUN 6  6 - 23 mg/dL   Creatinine, Ser 0.60  0.50 - 1.10 mg/dL   Glucose, Bld 195 (*) 70 - 99 mg/dL   Calcium, Ion  1.15  1.12 - 1.23 mmol/L   TCO2 23  0 - 100 mmol/L   Hemoglobin 15.0  12.0 - 15.0 g/dL   HCT 44.0  36.0 - 46.0 %    Stools were obtained for culture and C. difficile DNA probe.  Course in [facility] [place]   She was given Zofran ODT 8 mg sublingually and 1 L of normal saline IV. After that she felt a lot better.   Assessment   The primary encounter diagnosis was Gastroenteritis. A diagnosis of Dehydration was also pertinent to this visit.  Plan   1.  Meds:  The following meds were prescribed:   New Prescriptions   DIPHENOXYLATE-ATROPINE (LOMOTIL) 2.5-0.025 MG PER TABLET    Take 1 tablet by mouth 4 (four) times daily as needed for diarrhea or loose stools.   ONDANSETRON (ZOFRAN ODT) 8 MG DISINTEGRATING TABLET    Take 1 tablet (8 mg total) by mouth every 8 (eight) hours as needed for nausea.    2.  Patient Education/Counseling:  The patient was given appropriate handouts, self care instructions, and instructed in symptomatic relief. The patient was told to stay on clear liquids for the remainder of the day, then advance to a B.R.A.T. diet starting tomorrow.   3.  Follow up:  The patient was told to follow up here if no better in 2 to 3 days, or sooner if becoming worse in any way, and given some red flag symptoms such as persistent vomitng, high fever, severe abdominal pain, or any GI bleeding which would prompt immediate return.         Harden Mo, MD 12/29/13 [id number]

## 2013-12-29 NOTE — Telephone Encounter (Signed)
Office please call pt regarding appt for today.  Pt is having > 15 diarrhea stools in the last 24 hours.  Dr Sarajane Jews is full but practice orders states to send note if he is in office for possible appt.  Pt aware that she will receive a call back from office with further instructions.  Please see Triage Report that was faxed to the office due to system down.

## 2013-12-29 NOTE — Discharge Instructions (Signed)

## 2013-12-29 NOTE — Telephone Encounter (Signed)
Spoke to pt, asked her if she is having any other symptoms? Pt said chills, fever, stomach cramps and diarrhea. Told pt there are no appointments available in this office today. Told pt need to go to [facility] to be evaluated they will be able to give you medication for diarrhea and make sure you are not dehydrated. Pt verbalized understanding.

## 2014-01-02 LAB — STOOL CULTURE

## 2014-01-02 NOTE — Progress Notes (Signed)
Quick Note:  Test result was normal. No further action is needed at this time. ______ 

## 2014-01-08 ENCOUNTER — Other Ambulatory Visit: Payer: Self-pay | Admitting: Endocrinology

## 2014-01-08 ENCOUNTER — Other Ambulatory Visit: Payer: Self-pay | Admitting: *Deleted

## 2014-01-08 MED ORDER — ROSUVASTATIN CALCIUM 40 MG PO TABS: 40.0000 mg | ORAL_TABLET | Freq: Every day | ORAL | Status: DC

## 2014-01-13 ENCOUNTER — Ambulatory Visit (INDEPENDENT_AMBULATORY_CARE_PROVIDER_SITE_OTHER): Payer: BC Managed Care – PPO | Admitting: Endocrinology

## 2014-01-13 ENCOUNTER — Encounter: Payer: Self-pay | Admitting: Endocrinology

## 2014-01-13 VITALS — BP 116/62 | HR 119 | Temp 97.9°F | Ht 63.0 in | Wt 262.0 lb

## 2014-01-13 DIAGNOSIS — E1039 Type 1 diabetes mellitus with other diabetic ophthalmic complication: Secondary | ICD-10-CM

## 2014-01-13 NOTE — Progress Notes (Signed)
Subjective:    Patient ID: Miranda [name], female    DOB: 06-27-1976, 38 y.o.   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  She says she takes a total of approx 80-100 units of insulin total per day, via her pump.  She denies pump problems.  i have downloaded and reviewed data from her continuous glucose monitor.  Last 2 weeks are scanned into the medical record.  pt states she feels well in general.    Past Medical History  Diagnosis Date  . Hypothyroidism     sees Dr. Elyse Hsu  . GERD (gastroesophageal reflux disease)   . Hyperlipidemia   . Hypertension   . Migraine syndrome   . Neuropathy associated with endocrine disorder   . Asthma   . Neck pain   . Insomnia   . Depression     sees Dr. Matilde Haymaker in [place]   . Diabetes mellitus     type 2, on insulin pump, sees Dr. Loanne Drilling   . Morbid obesity     Past Surgical History  Procedure Laterality Date  . Blocked itestinal repair    . Hernia repair    . Cholecystectomy      History   Social History  . Marital Status: Married    Spouse Name: N/A    Number of Children: N/A  . Years of Education: N/A   Occupational History  . Not on file.   Social History Main Topics  . Smoking status: Former Smoker -- 1.00 packs/day    Types: Cigarettes    Quit date: 04/27/2013  . Smokeless tobacco: Never Used     Comment: less than 1 pack per day  . Alcohol Use: No  . Drug Use: No  . Sexual Activity: Yes   Other Topics Concern  . Not on file   Social History Narrative  . No narrative on file    Current Outpatient Prescriptions on File Prior to  Visit  Medication Sig Dispense Refill  . albuterol (PROAIR HFA) 108 (90 BASE) MCG/ACT inhaler Inhale 2 puffs into the lungs every 4 (four) hours as needed for wheezing.  8.5 g  11  . budesonide-formoterol (SYMBICORT) 160-4.5 MCG/ACT inhaler Inhale 2 puffs into the lungs 2 (two) times daily.  30.4 g  3  . buPROPion (WELLBUTRIN XL) 300 MG 24 hr tablet Take 300 mg by mouth daily.      . cetirizine (ZYRTEC) 10 MG tablet Take 10 mg by mouth daily.        . cyclobenzaprine (FLEXERIL) 10 MG tablet Take 1 tablet (10 mg total) by mouth 3 (three) times daily as needed for muscle spasms.  90 tablet  3  . cycloSPORINE (RESTASIS) 0.05 % ophthalmic emulsion 1 drop 2 (two) times daily.      . diclofenac (VOLTAREN) 75 MG EC tablet Take 1 tablet (75 mg total) by mouth 2 (two) times daily.  60 tablet  5  . diphenoxylate-atropine (LOMOTIL) 2.5-0.025 MG per tablet Take 1 tablet by mouth 4 (four) times daily as needed for diarrhea or loose stools.  16 tablet  0  . furosemide (LASIX)  20 MG tablet Take 1 tablet (20 mg total) by mouth daily.  90 tablet  3  . glucose blood (BAYER CONTOUR NEXT TEST) test strip 1 each by Other route 4 (four) times daily. And lancets 4/day  120 each  12  . HYDROcodone-acetaminophen (NORCO/VICODIN) 5-325 MG per tablet Take 1 tablet by mouth every 6 (six) hours as needed for pain.  60 tablet  0  . hydrOXYzine (VISTARIL) 50 MG capsule Take 1 tablet by mouth daily as needed.       . hyoscyamine (LEVSIN) 0.125 MG/5ML ELIX Take 0.125 mg by mouth as needed.        . insulin aspart (NOVOLOG) 100 UNIT/ML injection For use in insulin pump, a total of 120 units per day  120 mL  3  . Insulin Infusion Pump Supplies (INSET INFUSION SET 23" 9MM) MISC 1 Device by Does not apply route every 3 (three) days.      . Insulin Infusion Pump Supplies (INSULIN PUMP SYRINGE RESERVOIR) MISC 1 Device by Does not apply route 3 (three) times daily with meals as needed.  30 each  3  . levothyroxine (SYNTHROID, LEVOTHROID)  125 MCG tablet Take 1 tablet (125 mcg total) by mouth daily.  90 tablet  3  . metFORMIN (GLUCOPHAGE) 1000 MG tablet Take 1 tablet (1,000 mg total) by mouth 2 (two) times daily with a meal.  60 tablet  3  . metoprolol succinate (TOPROL-XL) 25 MG 24 hr tablet TAKE 1 TABLET BY MOUTH EVERY DAY  30 tablet  3  . Multiple Vitamin (MULTIVITAMIN) tablet Take 1 tablet by mouth daily.        Marland Kitchen omeprazole (PRILOSEC) 20 MG capsule Take 1 capsule (20 mg total) by mouth daily.  90 capsule  0  . ondansetron (ZOFRAN ODT) 8 MG disintegrating tablet Take 1 tablet (8 mg total) by mouth every 8 (eight) hours as needed for nausea.  20 tablet  0  . pioglitazone (ACTOS) 45 MG tablet TAKE 1 TABLET ONCE A DAY ORALLY  90 tablet  4  . potassium chloride SA (KLOR-CON M20) 20 MEQ tablet Take 1 tablet (20 mEq total) by mouth daily.  90 tablet  3  . prazosin (MINIPRESS) 5 MG capsule Take 5 mg by mouth at bedtime. Take 2      . promethazine (PHENERGAN) 25 MG tablet Take 25 mg by mouth as needed.        . risperiDONE (RISPERDAL) 2 MG tablet Take 2 mg by mouth daily.       . rizatriptan (MAXALT) 10 MG tablet Take 1 tablet (10 mg total) by mouth as needed for migraine.  10 tablet  11  . rosuvastatin (CRESTOR) 40 MG tablet Take 1 tablet (40 mg total) by mouth daily.  30 tablet  3  . sertraline (ZOLOFT) 100 MG tablet Take 100 mg by mouth daily. Take 2 every day      . topiramate (TOPAMAX) 50 MG tablet Take 2 tablets (100 mg total) by mouth daily.  180 tablet  0  . valACYclovir (VALTREX) 1000 MG tablet Take 1 tablet (1,000 mg total) by mouth 2 (two) times daily.  20 tablet  0   No current facility-administered medications on file prior to visit.    Allergies  Allergen Reactions  . Exenatide     hives  . Penicillins   . Tramadol Hcl   . Avocado Diarrhea and Other (See Comments)    Severe cramping, sweats, and diarrhea in upper GI/stomach  No family history on file.  BP 116/62  Pulse 119  Temp(Src) 97.9 F (36.6 C)  (Oral)  Ht 5\' 3"  (1.6 m)  Wt 262 lb (118.842 kg)  BMI 46.42 kg/m2  SpO2 97%  Review of Systems She has gained a few lbs.  She denies hypoglycemia    Objective:   Physical Exam VITAL SIGNS:  See vs page GENERAL: no distress   Lab Results  Component Value Date   HGBA1C 9.1* 12/23/2013      Assessment & Plan:  DM: needs increased rxThis insulin pump regimen was chosen from multiple options, as it best matches his insulin to his changing requirements throughout the day.  The benefits of glycemic control must be weighed against the risks of hypoglycemia.   Morbid obesity: this complicates the rx of DM.

## 2014-01-13 NOTE — Patient Instructions (Addendum)
Please increase your basal rate to 1.8 unit/hr, except 3.5 units/hr, 3 AM-6 AM.   Increase your mealtime bolus to 1 unit/6 grams carbohydrate.  However, take an extra 5 units with your lunch bolus.   continue correction bolus (which some people call "sensitivity," or "insulin sensitivity ratio," or just "isr") to 1 unit for each 20 by which your glucose exceeds 100. Please come back for a follow-up appointment in 2 months.  check your blood sugar 4 times a day--before the 3 meals, and at bedtime.  also check if you have symptoms of your blood sugar being too high or too low.  please keep a record of the readings and bring it to your next appointment here.  please call us sooner if your blood sugar goes below 70, or if you have a lot of readings over 200.

## 2014-01-23 ENCOUNTER — Encounter: Payer: Self-pay | Admitting: Family Medicine

## 2014-01-23 ENCOUNTER — Ambulatory Visit (INDEPENDENT_AMBULATORY_CARE_PROVIDER_SITE_OTHER): Payer: BC Managed Care – PPO | Admitting: Family Medicine

## 2014-01-23 VITALS — BP 118/74 | HR 125 | Temp 98.3°F | Ht 63.0 in | Wt 265.0 lb

## 2014-01-23 DIAGNOSIS — K766 Portal hypertension: Secondary | ICD-10-CM

## 2014-01-23 DIAGNOSIS — I10 Essential (primary) hypertension: Secondary | ICD-10-CM

## 2014-01-23 DIAGNOSIS — E1039 Type 1 diabetes mellitus with other diabetic ophthalmic complication: Secondary | ICD-10-CM

## 2014-01-23 DIAGNOSIS — E039 Hypothyroidism, unspecified: Secondary | ICD-10-CM

## 2014-01-23 DIAGNOSIS — G589 Mononeuropathy, unspecified: Secondary | ICD-10-CM

## 2014-01-23 LAB — CBC WITH DIFFERENTIAL/PLATELET
BASOS ABS: 0 10*3/uL (ref 0.0–0.1)
BASOS PCT: 0.5 % (ref 0.0–3.0)
EOS ABS: 0.2 10*3/uL (ref 0.0–0.7)
Eosinophils Relative: 2 % (ref 0.0–5.0)
HCT: 41.1 % (ref 36.0–46.0)
Hemoglobin: 13.8 g/dL (ref 12.0–15.0)
LYMPHS PCT: 25 % (ref 12.0–46.0)
Lymphs Abs: 2.2 10*3/uL (ref 0.7–4.0)
MCHC: 33.6 g/dL (ref 30.0–36.0)
MCV: 91.7 fl (ref 78.0–100.0)
Monocytes Absolute: 0.6 10*3/uL (ref 0.1–1.0)
Monocytes Relative: 7.2 % (ref 3.0–12.0)
NEUTROS PCT: 65.3 % (ref 43.0–77.0)
Neutro Abs: 5.6 10*3/uL (ref 1.4–7.7)
PLATELETS: 191 10*3/uL (ref 150.0–400.0)
RBC: 4.48 Mil/uL (ref 3.87–5.11)
RDW: 14 % (ref 11.5–14.6)
WBC: 8.6 10*3/uL (ref 4.5–10.5)

## 2014-01-23 LAB — BASIC METABOLIC PANEL
BUN: 15 mg/dL (ref 6–23)
CALCIUM: 9.9 mg/dL (ref 8.4–10.5)
CHLORIDE: 100 meq/L (ref 96–112)
CO2: 27 meq/L (ref 19–32)
CREATININE: 0.7 mg/dL (ref 0.4–1.2)
GFR: 100.26 mL/min (ref >60.00)
Glucose, Bld: 352 mg/dL — ABNORMAL HIGH (ref 70–99)
Potassium: 4.9 mEq/L (ref 3.5–5.1)
Sodium: 136 mEq/L (ref 135–145)

## 2014-01-23 LAB — HEPATIC FUNCTION PANEL
ALBUMIN: 4 g/dL (ref 3.5–5.2)
ALT: 159 U/L — AB (ref 0–35)
AST: 165 U/L — ABNORMAL HIGH (ref 0–37)
Alkaline Phosphatase: 58 U/L (ref 39–117)
Bilirubin, Direct: 0 mg/dL (ref 0.0–0.3)
Total Bilirubin: 0.5 mg/dL (ref 0.3–1.2)
Total Protein: 7.2 g/dL (ref 6.0–8.3)

## 2014-01-23 LAB — VITAMIN B12: VITAMIN B 12: 1069 pg/mL — AB (ref 211–911)

## 2014-01-23 LAB — TSH: TSH: 4.52 u[IU]/mL (ref 0.35–5.50)

## 2014-01-23 NOTE — Progress Notes (Signed)
Pre visit review using our clinic review tool, if applicable. No additional management support is needed unless otherwise documented below in the visit note. 

## 2014-01-23 NOTE — Progress Notes (Signed)
   Subjective:    Patient ID: Miranda [name], female    DOB: [birth year], 38 y.o.   MRN: [id number]  HPI Here to discuss generalized numbness. For the past year she has noticed some generalized lack of sensation over her body. She says she sometimes cannot feel her clothing on her body. She has had diabetic neuropathy with burning in her feet for several years.    Review of Systems  Constitutional: Negative.   Respiratory: Negative.   Cardiovascular: Negative.   Neurological: Positive for numbness. Negative for dizziness, tremors, seizures, syncope, facial asymmetry, speech difficulty, weakness, light-headedness and headaches.       Objective:   Physical Exam  Constitutional: She is oriented to person, place, and time. She appears well-developed and well-nourished.  Cardiovascular: Normal rate, regular rhythm, normal heart sounds and intact distal pulses.   Pulmonary/Chest: Effort normal and breath sounds normal.  Neurological: She is alert and oriented to person, place, and time. No cranial nerve deficit. She exhibits normal muscle tone. Coordination normal.          Assessment & Plan:  This is probably an extension of her diabetic neuropathy, but we will get some labs to check for other possible etiologies. We discussed the possibility of getting back on Gabapentin, but she wants to avoid this for now.

## 2014-01-24 ENCOUNTER — Telehealth: Payer: Self-pay | Admitting: Family Medicine

## 2014-01-24 NOTE — Telephone Encounter (Signed)
Relevant patient education assigned to patient using Emmi. ° °

## 2014-02-04 ENCOUNTER — Telehealth: Payer: Self-pay

## 2014-02-04 NOTE — Telephone Encounter (Signed)
Relevant patient education assigned to patient using Emmi. ° °

## 2014-02-19 ENCOUNTER — Other Ambulatory Visit: Payer: Self-pay | Admitting: Endocrinology

## 2014-02-20 ENCOUNTER — Encounter: Payer: Self-pay | Admitting: Family Medicine

## 2014-03-30 ENCOUNTER — Ambulatory Visit: Payer: BC Managed Care – PPO | Admitting: Endocrinology

## 2014-04-13 ENCOUNTER — Telehealth: Payer: Self-pay | Admitting: Gastroenterology

## 2014-04-13 NOTE — Telephone Encounter (Signed)
Left message for patient to call back  

## 2014-04-14 NOTE — Telephone Encounter (Signed)
Patient with alternating bowel habits she would like to wait and see Dr. Fuller Plan.  She is scheduled for 06/24/14

## 2014-05-25 ENCOUNTER — Other Ambulatory Visit: Payer: Self-pay | Admitting: Family Medicine

## 2014-05-25 ENCOUNTER — Telehealth: Payer: Self-pay | Admitting: *Deleted

## 2014-05-25 DIAGNOSIS — E785 Hyperlipidemia, unspecified: Secondary | ICD-10-CM

## 2014-05-25 DIAGNOSIS — E119 Type 2 diabetes mellitus without complications: Secondary | ICD-10-CM

## 2014-05-25 NOTE — Telephone Encounter (Signed)
Unable to reach patient by phone.   Message sent to mychart Lipid panel ordered Diabetic bundle

## 2014-06-12 NOTE — Telephone Encounter (Signed)
Left message on machine for patient to schedule an appointment to follow up with diabetes

## 2014-06-24 ENCOUNTER — Ambulatory Visit: Payer: BC Managed Care – PPO | Admitting: Gastroenterology

## 2014-06-25 ENCOUNTER — Telehealth: Payer: Self-pay

## 2014-06-25 NOTE — Telephone Encounter (Signed)
Diabetic Bundle. Requested call back to schedule with Dr. Loanne Drilling.

## 2014-07-01 ENCOUNTER — Telehealth: Payer: Self-pay | Admitting: Family Medicine

## 2014-07-01 ENCOUNTER — Encounter: Payer: Self-pay | Admitting: Family Medicine

## 2014-07-01 ENCOUNTER — Ambulatory Visit (INDEPENDENT_AMBULATORY_CARE_PROVIDER_SITE_OTHER): Payer: Self-pay | Admitting: Family Medicine

## 2014-07-01 ENCOUNTER — Telehealth: Payer: Self-pay | Admitting: Endocrinology

## 2014-07-01 VITALS — BP 106/89 | HR 105 | Temp 97.9°F | Ht 63.0 in | Wt 240.0 lb

## 2014-07-01 DIAGNOSIS — E039 Hypothyroidism, unspecified: Secondary | ICD-10-CM

## 2014-07-01 DIAGNOSIS — E785 Hyperlipidemia, unspecified: Secondary | ICD-10-CM

## 2014-07-01 DIAGNOSIS — I10 Essential (primary) hypertension: Secondary | ICD-10-CM

## 2014-07-01 DIAGNOSIS — G43909 Migraine, unspecified, not intractable, without status migrainosus: Secondary | ICD-10-CM

## 2014-07-01 DIAGNOSIS — G589 Mononeuropathy, unspecified: Secondary | ICD-10-CM

## 2014-07-01 DIAGNOSIS — K219 Gastro-esophageal reflux disease without esophagitis: Secondary | ICD-10-CM

## 2014-07-01 DIAGNOSIS — Z23 Encounter for immunization: Secondary | ICD-10-CM

## 2014-07-01 MED ORDER — DIPHENOXYLATE-ATROPINE 2.5-0.025 MG PO TABS: 1.0000 | ORAL_TABLET | Freq: Four times a day (QID) | ORAL | Status: DC | PRN

## 2014-07-01 MED ORDER — LEVOTHYROXINE SODIUM 125 MCG PO TABS: 125.0000 ug | ORAL_TABLET | Freq: Every day | ORAL | Status: DC

## 2014-07-01 MED ORDER — PIOGLITAZONE HCL 45 MG PO TABS: ORAL_TABLET | ORAL | Status: DC

## 2014-07-01 MED ORDER — POTASSIUM CHLORIDE CRYS ER 20 MEQ PO TBCR
20.0000 meq | EXTENDED_RELEASE_TABLET | Freq: Every day | ORAL | Status: DC
Start: 2014-07-01 — End: 2016-01-14

## 2014-07-01 MED ORDER — OMEPRAZOLE 20 MG PO CPDR: DELAYED_RELEASE_CAPSULE | ORAL | Status: DC

## 2014-07-01 MED ORDER — METFORMIN HCL 1000 MG PO TABS: 1000.0000 mg | ORAL_TABLET | Freq: Two times a day (BID) | ORAL | Status: DC

## 2014-07-01 MED ORDER — VALACYCLOVIR HCL 1 G PO TABS: 1000.0000 mg | ORAL_TABLET | Freq: Two times a day (BID) | ORAL | Status: DC

## 2014-07-01 MED ORDER — METOPROLOL SUCCINATE ER 25 MG PO TB24: ORAL_TABLET | ORAL | Status: DC

## 2014-07-01 MED ORDER — ROSUVASTATIN CALCIUM 40 MG PO TABS: 40.0000 mg | ORAL_TABLET | Freq: Every day | ORAL | Status: DC

## 2014-07-01 NOTE — Progress Notes (Signed)
Pre visit review using our clinic review tool, if applicable. No additional management support is needed unless otherwise documented below in the visit note. 

## 2014-07-01 NOTE — Telephone Encounter (Signed)
Patient would like to have her medications refilled   Actos Metformin   Pharmacy: [facility]  Thank you

## 2014-07-01 NOTE — Progress Notes (Signed)
   Subjective:    Patient ID: Miranda [name], female    DOB: 03/19/1976, 38 y.o.   MRN: [id number]  HPI Here for refills. She had lost her insurance for a time but she has it back now, and she needs multiple refills. She feels well in general. She will see Dr. Loanne Drilling on 07-10-14.    Review of Systems  Constitutional: Negative.   Respiratory: Negative.   Cardiovascular: Negative.   Gastrointestinal: Negative.   Neurological: Negative.        Objective:   Physical Exam  Constitutional: She appears well-developed and well-nourished.  Cardiovascular: Normal rate, regular rhythm, normal heart sounds and intact distal pulses.   Pulmonary/Chest: Effort normal and breath sounds normal.          Assessment & Plan:  meds were refilled. I filled out my portion of her DMV forms.

## 2014-07-01 NOTE — Telephone Encounter (Signed)
Pt would like to know should she continue lasix if so please rx into . [facility] for [phone]

## 2014-07-01 NOTE — Telephone Encounter (Signed)
Rx sent per pt's request.  

## 2014-07-01 NOTE — Telephone Encounter (Signed)
Yes she should stay on this so refill for one year

## 2014-07-03 MED ORDER — FUROSEMIDE 20 MG PO TABS: 20.0000 mg | ORAL_TABLET | Freq: Every day | ORAL | Status: DC

## 2014-07-03 NOTE — Telephone Encounter (Signed)
Rx done and I called the pt and advised her she should continue this and refills were sent to her pharmacy.

## 2014-07-07 ENCOUNTER — Ambulatory Visit: Payer: BC Managed Care – PPO | Admitting: Family Medicine

## 2014-07-10 ENCOUNTER — Ambulatory Visit (INDEPENDENT_AMBULATORY_CARE_PROVIDER_SITE_OTHER): Payer: Self-pay | Admitting: Endocrinology

## 2014-07-10 ENCOUNTER — Encounter: Payer: Self-pay | Admitting: Endocrinology

## 2014-07-10 VITALS — BP 122/78 | HR 85 | Temp 97.6°F | Ht 63.0 in | Wt 240.0 lb

## 2014-07-10 DIAGNOSIS — E1039 Type 1 diabetes mellitus with other diabetic ophthalmic complication: Secondary | ICD-10-CM

## 2014-07-10 MED ORDER — INSULIN GLARGINE 100 UNIT/ML SOLOSTAR PEN: 30.0000 [IU] | PEN_INJECTOR | Freq: Every day | SUBCUTANEOUS | Status: DC

## 2014-07-10 MED ORDER — INSULIN ASPART 100 UNIT/ML ~~LOC~~ SOLN: SUBCUTANEOUS | Status: DC

## 2014-07-10 NOTE — Progress Notes (Signed)
Subjective:    Patient ID: Miranda [name], female    DOB: 10-18-1976, 38 y.o.   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  She had to d/c pump, as she could not afford the supplies.  She is recently separated.  She recently regained her health insurance. She takes novolog, 1 unit/5 grams CHO, and 1 unit for each 20 mg% over 100.  no cbg record, but states cbg's are very high. She takes a total of approx 50 units/day. Past Medical History  Diagnosis Date  . Hypothyroidism     sees Dr. Elyse Hsu  . GERD (gastroesophageal reflux disease)   . Hyperlipidemia   . Hypertension   . Migraine syndrome   . Neuropathy associated with endocrine disorder   . Asthma   . Neck pain   . Insomnia   . Depression     sees Dr. Matilde Haymaker in [place]   . Diabetes mellitus     type 2, on insulin pump, sees Dr. Loanne Drilling   . Morbid obesity     Past Surgical History  Procedure Laterality Date  . Blocked itestinal repair    . Hernia repair    . Cholecystectomy      History   Social History  . Marital Status: Married    Spouse Name: N/A    Number of Children: N/A  . Years of Education: N/A   Occupational History  . Not on file.   Social History Main Topics  . Smoking status: Former Smoker -- 1.00 packs/day    Types: Cigarettes    Quit date: 04/27/2013  . Smokeless tobacco: Never Used     Comment: less than 1 pack per day  . Alcohol Use: Yes     Comment: occ  . Drug Use: No  . Sexual Activity: Yes   Other Topics Concern  . Not on file   Social History Narrative  . No narrative on file     Current Outpatient Prescriptions on File Prior to Visit  Medication Sig Dispense Refill  . albuterol (PROAIR HFA) 108 (90 BASE) MCG/ACT inhaler Inhale 2 puffs into the lungs every 4 (four) hours as needed for wheezing.  8.5 g  11  . budesonide-formoterol (SYMBICORT) 160-4.5 MCG/ACT inhaler Inhale 2 puffs into the lungs 2 (two) times daily.  30.4 g  3  . buPROPion (WELLBUTRIN XL) 300 MG 24 hr tablet Take 300 mg by mouth daily.      . cetirizine (ZYRTEC) 10 MG tablet Take 10 mg by mouth daily.        . cyclobenzaprine (FLEXERIL) 10 MG tablet Take 1 tablet (10 mg total) by mouth 3 (three) times daily as needed for muscle spasms.  90 tablet  3  . cycloSPORINE (RESTASIS) 0.05 % ophthalmic emulsion 1 drop 2 (two) times daily.      . diclofenac (VOLTAREN) 75 MG EC tablet Take 1 tablet (75 mg total) by mouth 2 (two) times daily.  60 tablet  5  . diphenoxylate-atropine (LOMOTIL) 2.5-0.025 MG per tablet Take 1 tablet by mouth 4 (four) times daily as needed for diarrhea or loose stools.  60 tablet  5  . furosemide (LASIX) 20 MG tablet Take 1 tablet (20 mg total) by mouth daily.  90 tablet  3  . glucose blood (BAYER CONTOUR NEXT TEST) test strip 1 each by Other route 4 (four) times daily. And lancets 4/day  120 each  12  . HYDROcodone-acetaminophen (NORCO/VICODIN) 5-325 MG per tablet Take 1 tablet by mouth every 6 (six) hours as needed for pain.  60 tablet  0  . hydrOXYzine (VISTARIL) 50 MG capsule Take 1 tablet by mouth daily as needed.       . hyoscyamine (LEVSIN) 0.125 MG/5ML ELIX Take 0.125 mg by mouth as needed.        Marland Kitchen levothyroxine (SYNTHROID, LEVOTHROID) 125 MCG tablet Take 1 tablet (125 mcg total) by mouth daily.  90 tablet  3  . metFORMIN (GLUCOPHAGE) 1000 MG tablet Take 1 tablet (1,000 mg total) by mouth 2 (two) times daily with a meal.  60 tablet  0  . metoprolol succinate (TOPROL-XL) 25 MG 24 hr tablet TAKE 1/2 TABLET BY MOUTH EVERY DAY  90 tablet  3  . Multiple Vitamin (MULTIVITAMIN)  tablet Take 1 tablet by mouth daily.        Marland Kitchen omeprazole (PRILOSEC) 20 MG capsule TAKE 1 CAPSULE (20 MG TOTAL) BY MOUTH DAILY.  90 capsule  3  . ondansetron (ZOFRAN ODT) 8 MG disintegrating tablet Take 1 tablet (8 mg total) by mouth every 8 (eight) hours as needed for nausea.  20 tablet  0  . pioglitazone (ACTOS) 45 MG tablet TAKE 1 TABLET ONCE A DAY ORALLY  90 tablet  0  . potassium chloride SA (KLOR-CON M20) 20 MEQ tablet Take 1 tablet (20 mEq total) by mouth daily.  90 tablet  3  . prazosin (MINIPRESS) 5 MG capsule Take 5 mg by mouth at bedtime. Take 2      . promethazine (PHENERGAN) 25 MG tablet Take 25 mg by mouth as needed.        . risperiDONE (RISPERDAL) 2 MG tablet Take 2 mg by mouth daily.       . rizatriptan (MAXALT) 10 MG tablet Take 1 tablet (10 mg total) by mouth as needed for migraine.  10 tablet  11  . rosuvastatin (CRESTOR) 40 MG tablet Take 1 tablet (40 mg total) by mouth daily.  90 tablet  3  . sertraline (ZOLOFT) 100 MG tablet Take 100 mg by mouth daily. Take 2 every day      . valACYclovir (VALTREX) 1000 MG tablet Take 1 tablet (1,000 mg total) by mouth 2 (two) times daily.  60 tablet  5   No current facility-administered medications on file prior to visit.    Allergies  Allergen Reactions  . Exenatide     hives  . Penicillins   . Tramadol Hcl   . Avocado Diarrhea and Other (See Comments)    Severe cramping, sweats, and diarrhea in upper GI/stomach    No family history on file.  BP 122/78  Pulse 85  Temp(Src) 97.6 F (36.4 C) (Oral)  Ht 5\' 3"  (1.6 m)  Wt 240 lb (108.863 kg)  BMI 42.52 kg/m2  SpO2 97%  LMP 06/18/2014   Review of Systems She denies hypoglycemia and weight change.      Objective:   Physical Exam VITAL SIGNS:  See vs page GENERAL: no distress Pulses: dorsalis pedis intact bilat.   Feet: no deformity.  no edema.  There is bilateral onychomycosis Skin:  no ulcer on  the feet.  normal color and temp.   Neuro: sensation is intact to touch  on the feet.         Assessment & Plan:  DM: moderate exacerbation Noncompliance with cbg recording: I'll work around this as best I can Domestic situation, new: this is complicating rx of her DM.  i wished her well.    Patient is advised the following: Patient Instructions  Please start lantus, 30 units at bedtime Take novolog, 1 unit/6 grams carbohydrate.  However, take an extra 5 units with your lunch bolus.   continue extra novolog, 1 unit for each 20 by which your glucose exceeds 100. Please come back for a follow-up appointment in 1 month.  check your blood sugar 4 times a day--before the 3 meals, and at bedtime.  also check if you have symptoms of your blood sugar being too high or too low.  please keep a record of the readings and bring it to your next appointment here.  please call us sooner if your blood sugar goes below 70, or if you have a lot of readings over 200.

## 2014-07-10 NOTE — Patient Instructions (Addendum)
Please start lantus, 30 units at bedtime Take novolog, 1 unit/6 grams carbohydrate.  However, take an extra 5 units with your lunch bolus.   continue extra novolog, 1 unit for each 20 by which your glucose exceeds 100. Please come back for a follow-up appointment in 1 month.  check your blood sugar 4 times a day--before the 3 meals, and at bedtime.  also check if you have symptoms of your blood sugar being too high or too low.  please keep a record of the readings and bring it to your next appointment here.  please call us sooner if your blood sugar goes below 70, or if you have a lot of readings over 200.

## 2014-07-21 LAB — HM DIABETES EYE EXAM

## 2014-08-10 ENCOUNTER — Ambulatory Visit: Payer: BC Managed Care – PPO | Admitting: Endocrinology

## 2014-08-29 ENCOUNTER — Other Ambulatory Visit: Payer: Self-pay | Admitting: Endocrinology

## 2014-09-04 ENCOUNTER — Telehealth: Payer: Self-pay | Admitting: Family Medicine

## 2014-09-04 NOTE — Telephone Encounter (Signed)
Patient Information:  Caller Name: Vinessa  Phone: [phone]  Patient: Miranda [name]  Gender: Female  DOB: 26-Jun-1976  Age: 38 Years  PCP: [facility])  Pregnant: No  Office Follow Up:  Does the office need to follow up with this patient?: No  Instructions For The Office: N/A  RN Note:  Pt is calling about abscess on right arm.  She reports that it has [name] present for "months" and worse "in the past 4 weeks"   Pt reports that area looks like a boil.  Afebrile, Size of marble but deep into arm.  Does not have a white head or pus.  It has drained in the past.  She reports that it appears purple/red on surface.  Pain is mild to moderate.  Pt is concerned because she does not have insurance at this time.  Pt triaged to be seen in 3 days.  She is connected with a scheduler to attempt to eastablish a payment option.  Home care advice and call back information given.  Symptoms  Reason For Call & Symptoms: Abscess under right arm  Reviewed Health History In EMR: Yes  Reviewed Medications In EMR: Yes  Reviewed Allergies In EMR: Yes  Reviewed Surgeries / Procedures: Yes  Date of Onset of Symptoms: 07/30/2014  Treatments Tried: warm compress  Treatments Tried Worked: No OB / GYN:  LMP: Unknown  Guideline(s) Used:  Wound Infection  Disposition Per Guideline:   See Within 3 Days in Office  Reason For Disposition Reached:   Wound hasn't healed within 10 days after the injury  Advice Given:  Warm Soaks or Local Heat:  If the wound is open, soak it in warm water or put a warm wet cloth on the wound for 20 minutes 3 times per day. Use a warm saltwater solution containing 2 teaspoons of table salt per quart of water. If the wound is closed, apply a heating pad or warm, moist washcloth to the reddened area for 20 minutes 3 times per day.  Call Back If:   Wound becomes more tender  Redness starts to spread  Pus, drainage, or fever occurs  You become worse  Patient  Will Follow Care Advice:  YES

## 2014-09-04 NOTE — Telephone Encounter (Signed)
I spoke with pt and she will follow the advice from call a nurse and she is scheduled to see Dr. Sarajane Jews on Monday 09/07/14.

## 2014-09-07 ENCOUNTER — Ambulatory Visit (INDEPENDENT_AMBULATORY_CARE_PROVIDER_SITE_OTHER): Payer: Self-pay | Admitting: Family Medicine

## 2014-09-07 ENCOUNTER — Encounter: Payer: Self-pay | Admitting: Family Medicine

## 2014-09-07 VITALS — BP 116/82 | HR 95 | Temp 99.4°F | Ht 63.0 in | Wt 236.0 lb

## 2014-09-07 DIAGNOSIS — L02439 Carbuncle of limb, unspecified: Secondary | ICD-10-CM

## 2014-09-07 DIAGNOSIS — L02429 Furuncle of limb, unspecified: Secondary | ICD-10-CM

## 2014-09-07 MED ORDER — DOXYCYCLINE HYCLATE 100 MG PO CAPS: 100.0000 mg | ORAL_CAPSULE | Freq: Two times a day (BID) | ORAL | Status: AC

## 2014-09-07 NOTE — Progress Notes (Signed)
Pre visit review using our clinic review tool, if applicable. No additional management support is needed unless otherwise documented below in the visit note. 

## 2014-09-07 NOTE — Progress Notes (Signed)
   Subjective:    Patient ID: Miranda [name], female    DOB: 08/11/1976, 38 y.o.   MRN: [id number]  HPI Here for one week of a swollen tender lump under the right armpit. No fever. It drains fluid off and on. Using hot compresses.    Review of Systems  Constitutional: Negative.   Skin: Positive for wound.       Objective:   Physical Exam  Constitutional: She appears well-developed and well-nourished.  Skin:  1 cm tender boil in the right axilla, no active drainage           Assessment & Plan:  Treat with Doxycycline.

## 2014-11-27 ENCOUNTER — Other Ambulatory Visit: Payer: Self-pay | Admitting: Endocrinology

## 2015-01-04 ENCOUNTER — Telehealth: Payer: Self-pay | Admitting: Endocrinology

## 2015-01-04 NOTE — Telephone Encounter (Signed)
please call patient: i received request for pump supplies.   1.  Are you back on the pump? 2.  Ov is due

## 2015-01-05 NOTE — Telephone Encounter (Signed)
Left message for pt to call back  °

## 2015-01-05 NOTE — Telephone Encounter (Signed)
Pt state she is trying to get back on the pump.  Appointment scheduled

## 2015-01-18 ENCOUNTER — Encounter: Payer: Self-pay | Admitting: Endocrinology

## 2015-01-21 ENCOUNTER — Ambulatory Visit: Payer: Self-pay | Admitting: Endocrinology

## 2015-01-22 ENCOUNTER — Ambulatory Visit: Payer: Self-pay | Admitting: Endocrinology

## 2015-02-10 ENCOUNTER — Encounter: Payer: Self-pay | Admitting: Endocrinology

## 2015-02-10 ENCOUNTER — Ambulatory Visit (INDEPENDENT_AMBULATORY_CARE_PROVIDER_SITE_OTHER): Payer: BLUE CROSS/BLUE SHIELD | Admitting: Endocrinology

## 2015-02-10 VITALS — BP 130/84 | HR 111 | Temp 97.7°F | Ht 63.0 in | Wt 249.0 lb

## 2015-02-10 DIAGNOSIS — E1042 Type 1 diabetes mellitus with diabetic polyneuropathy: Secondary | ICD-10-CM

## 2015-02-10 NOTE — Progress Notes (Signed)
Subjective:    Patient ID: Miranda [name], female    DOB: 10-25-1976, 39 y.o.   MRN: [id number]  HPI  Pt returns for f/u of diabetes mellitus: DM type: 1 Dx'ed: 9476 Complications: polyneuropathy and retinopathy Therapy: insulin since dx GDM: never DKA: never Severe hypoglycemia: never Pancreatitis: never Other: she has a one-touch ping insulin infusion pump, and a medtronic continuous glucose monitor; she says she takes the metformin and actos for the PCOS and NASH, rather than DM Interval history:  She takes actos as rx'ed.   She has resumed the pump. continue basal rate of 2.5 units/hr, except for 3.5 units/hr, 3 am to 6 am continue mealtime bolus of 1 unit/ 6 grams carbohydrate, but she takes takes 5 extra units at lunch. continue correction bolus (which some people call "sensitivity," or "insulin sensitivity ratio," or just "isr") of 1 unit for each 20 by which your glucose exceeds 100.   no cbg record, but states she has had only 1 episode of hypoglycemia recently, and this was mild.  It happened in the afternoon.  She takes a total of approx 100 total units per day.   Past Medical History  Diagnosis Date  . Hypothyroidism     sees Dr. Elyse Hsu  . GERD (gastroesophageal reflux disease)   . Hyperlipidemia   . Hypertension   . Migraine syndrome   . Neuropathy associated with endocrine disorder   . Asthma   . Neck pain   . Insomnia   . Depression     sees Dr. Matilde Haymaker in [place]   . Diabetes mellitus     type 2, on insulin pump, sees Dr. Loanne Drilling   . Morbid obesity     Past Surgical History  Procedure Laterality Date  . Blocked itestinal repair    . Hernia repair    . Cholecystectomy      History   Social History  . Marital Status: Married    Spouse Name: N/A  . Number of Children: N/A  . Years of Education: N/A   Occupational History  . Not on file.   Social History Main Topics  . Smoking status: Former Smoker -- 1.00 packs/day    Types:  Cigarettes    Quit date: 04/27/2013  . Smokeless tobacco: Never Used     Comment: less than 1 pack per day  . Alcohol Use: 0.0 oz/week    0 Standard drinks or equivalent per week     Comment: rare  . Drug Use: No  . Sexual Activity: Yes   Other Topics Concern  . Not on file   Social History Narrative    Current Outpatient Prescriptions on File Prior to Visit  Medication Sig Dispense Refill  . albuterol (PROAIR HFA) 108 (90 BASE) MCG/ACT inhaler Inhale 2 puffs into the lungs every 4 (four) hours as needed for wheezing. 8.5 g 11  . budesonide-formoterol (SYMBICORT) 160-4.5 MCG/ACT inhaler Inhale 2 puffs into the lungs 2 (two) times daily. 30.4 g 3  . buPROPion (WELLBUTRIN XL) 300 MG 24 hr tablet Take 300 mg by mouth daily.    . cetirizine (ZYRTEC) 10 MG tablet Take 10 mg by mouth daily.      . cyclobenzaprine (FLEXERIL) 10 MG tablet Take 1 tablet (10 mg total) by mouth 3 (three) times daily as needed for muscle spasms. 90 tablet 3  . cycloSPORINE (RESTASIS) 0.05 % ophthalmic emulsion 1 drop 2 (two) times daily.    . diclofenac (VOLTAREN) 75 MG EC  tablet Take 1 tablet (75 mg total) by mouth 2 (two) times daily. 60 tablet 5  . diphenoxylate-atropine (LOMOTIL) 2.5-0.025 MG per tablet Take 1 tablet by mouth 4 (four) times daily as needed for diarrhea or loose stools. 60 tablet 5  . furosemide (LASIX) 20 MG tablet Take 1 tablet (20 mg total) by mouth daily. 90 tablet 3  . glucose blood (BAYER CONTOUR NEXT TEST) test strip 1 each by Other route 4 (four) times daily. And lancets 4/day 120 each 12  . HYDROcodone-acetaminophen (NORCO/VICODIN) 5-325 MG per tablet Take 1 tablet by mouth every 6 (six) hours as needed for pain. 60 tablet 0  . hydrOXYzine (VISTARIL) 50 MG capsule Take 1 tablet by mouth daily as needed.     . hyoscyamine (LEVSIN) 0.125 MG/5ML ELIX Take 0.125 mg by mouth as needed.      . insulin aspart (NOVOLOG) 100 UNIT/ML injection As directed, for a total of 120 units per day.  (Patient taking differently: For use in pump, for a total of 120 units per day.) 40 mL 11  . levothyroxine (SYNTHROID, LEVOTHROID) 125 MCG tablet Take 1 tablet (125 mcg total) by mouth daily. 90 tablet 3  . metFORMIN (GLUCOPHAGE) 1000 MG tablet TAKE 1 TABLET BY MOUTH TWICE A DAY WITH A MEAL 60 tablet 0  . metoprolol succinate (TOPROL-XL) 25 MG 24 hr tablet TAKE 1/2 TABLET BY MOUTH EVERY DAY 90 tablet 3  . Multiple Vitamin (MULTIVITAMIN) tablet Take 1 tablet by mouth daily.      Marland Kitchen omeprazole (PRILOSEC) 20 MG capsule TAKE 1 CAPSULE (20 MG TOTAL) BY MOUTH DAILY. 90 capsule 3  . ondansetron (ZOFRAN ODT) 8 MG disintegrating tablet Take 1 tablet (8 mg total) by mouth every 8 (eight) hours as needed for nausea. 20 tablet 0  . pioglitazone (ACTOS) 45 MG tablet TAKE 1 TABLET BY MOUTH EVERY DAY 90 tablet 0  . potassium chloride SA (KLOR-CON M20) 20 MEQ tablet Take 1 tablet (20 mEq total) by mouth daily. 90 tablet 3  . prazosin (MINIPRESS) 5 MG capsule Take 5 mg by mouth at bedtime. Take 2    . promethazine (PHENERGAN) 25 MG tablet Take 25 mg by mouth as needed.      . risperiDONE (RISPERDAL) 2 MG tablet Take 2 mg by mouth daily.     . rizatriptan (MAXALT) 10 MG tablet Take 1 tablet (10 mg total) by mouth as needed for migraine. 10 tablet 11  . sertraline (ZOLOFT) 100 MG tablet Take 100 mg by mouth daily. Take 2 every day    . valACYclovir (VALTREX) 1000 MG tablet Take 1 tablet (1,000 mg total) by mouth 2 (two) times daily. 60 tablet 5  . rosuvastatin (CRESTOR) 40 MG tablet Take 1 tablet (40 mg total) by mouth daily. (Patient not taking: Reported on 02/10/2015) 90 tablet 3   No current facility-administered medications on file prior to visit.    Allergies  Allergen Reactions  . Exenatide     hives  . Penicillins   . Tramadol Hcl   . Avocado Diarrhea and Other (See Comments)    Severe cramping, sweats, and diarrhea in upper GI/stomach    No family history on file.  BP 130/84 mmHg  Pulse 111   Temp(Src) 97.7 F (36.5 C) (Oral)  Ht 5\' 3"  (1.6 m)  Wt 249 lb (112.946 kg)  BMI 44.12 kg/m2  SpO2 96%   Review of Systems Denies LOC and weight change    Objective:   Physical  Exam VITAL SIGNS:  See vs page GENERAL: no distress Pulses: dorsalis pedis intact bilat.   MSK: no deformity of the feet CV: trace bilat leg edema.   Skin:  no ulcer on the feet.  normal color and temp on the feet. Neuro: sensation is intact to touch on the feet.   outside test results are reviewed: A1c=9.9 Hepatic transaminases: elevated  (i discussed with Leonia Reader, RN).    Assessment & Plan:  Morbid obesity: unchanged: i advised surgery, but she declines.   DM: worse.  NASH: worse.   Please continue the same pioglitizone.    Patient is advised the following: Patient Instructions  Please continue the same pump settings for now. Please call or message [place] next week, to tell us how the blood sugar is doing. check your blood sugar 4 times a day: before the 3 meals, and at bedtime.  also check if you have symptoms of your blood sugar being too high or too low.  please keep a record of the readings and bring it to your next appointment here.  You can write it on any piece of paper.  please call us sooner if your blood sugar goes below 70, or if you have a lot of readings over 200.   Please continue to pursue the continuous glucose monitor.   Please come back for a follow-up appointment in 6 weeks.

## 2015-02-10 NOTE — Patient Instructions (Addendum)
Please continue the same pump settings for now. Please call or message [place] next week, to tell us how the blood sugar is doing. check your blood sugar 4 times a day: before the 3 meals, and at bedtime.  also check if you have symptoms of your blood sugar being too high or too low.  please keep a record of the readings and bring it to your next appointment here.  You can write it on any piece of paper.  please call us sooner if your blood sugar goes below 70, or if you have a lot of readings over 200.   Please continue to pursue the continuous glucose monitor.   Please come back for a follow-up appointment in 6 weeks.

## 2015-02-28 ENCOUNTER — Other Ambulatory Visit: Payer: Self-pay | Admitting: Endocrinology

## 2015-03-04 ENCOUNTER — Encounter: Payer: Self-pay | Admitting: Family Medicine

## 2015-03-12 ENCOUNTER — Emergency Department (HOSPITAL_COMMUNITY)
Admission: EM | Admit: 2015-03-12 | Discharge: 2015-03-12 | Disposition: A | Discharge status: Home / Self Care | Payer: BLUE CROSS/BLUE SHIELD | Source: Home / Self Care | Attending: Family Medicine | Admitting: Family Medicine

## 2015-03-12 ENCOUNTER — Telehealth: Payer: Self-pay | Admitting: Family Medicine

## 2015-03-12 ENCOUNTER — Encounter (HOSPITAL_COMMUNITY): Payer: Self-pay

## 2015-03-12 DIAGNOSIS — R197 Diarrhea, unspecified: Secondary | ICD-10-CM | POA: Diagnosis not present

## 2015-03-12 LAB — CBC WITH DIFFERENTIAL/PLATELET
Basophils Absolute: 0.1 10*3/uL (ref 0.0–0.1)
Basophils Relative: 1 % (ref 0–1)
Eosinophils Absolute: 0.3 10*3/uL (ref 0.0–0.7)
Eosinophils Relative: 4 % (ref 0–5)
HCT: 38 % (ref 36.0–46.0)
Hemoglobin: 13.4 g/dL (ref 12.0–15.0)
Lymphocytes Relative: 35 % (ref 12–46)
Lymphs Abs: 3 10*3/uL (ref 0.7–4.0)
MCH: 31.8 pg (ref 26.0–34.0)
MCHC: 35.3 g/dL (ref 30.0–36.0)
MCV: 90.3 fL (ref 78.0–100.0)
Monocytes Absolute: 0.6 10*3/uL (ref 0.1–1.0)
Monocytes Relative: 6 % (ref 3–12)
NEUTROS ABS: 4.8 10*3/uL (ref 1.7–7.7)
Neutrophils Relative %: 54 % (ref 43–77)
Platelets: 208 10*3/uL (ref 150–400)
RBC: 4.21 MIL/uL (ref 3.87–5.11)
RDW: 13.2 % (ref 11.5–15.5)
WBC: 8.7 10*3/uL (ref 4.0–10.5)

## 2015-03-12 LAB — POCT URINALYSIS DIP (DEVICE)
Bilirubin Urine: NEGATIVE
GLUCOSE, UA: NEGATIVE mg/dL
Hgb urine dipstick: NEGATIVE
Ketones, ur: NEGATIVE mg/dL
Leukocytes, UA: NEGATIVE
Nitrite: NEGATIVE
Protein, ur: NEGATIVE mg/dL
Specific Gravity, Urine: 1.025 (ref 1.005–1.030)
UROBILINOGEN UA: 0.2 mg/dL (ref 0.0–1.0)
pH: 6 (ref 5.0–8.0)

## 2015-03-12 LAB — BASIC METABOLIC PANEL
ANION GAP: 8 (ref 5–15)
BUN: 9 mg/dL (ref 6–20)
CO2: 24 mmol/L (ref 22–32)
Calcium: 9.5 mg/dL (ref 8.9–10.3)
Chloride: 106 mmol/L (ref 101–111)
Creatinine, Ser: 0.68 mg/dL (ref 0.44–1.00)
GFR calc Af Amer: >60 mL/min (ref >60)
Glucose, Bld: 208 mg/dL — ABNORMAL HIGH (ref 65–99)
Potassium: 4 mmol/L (ref 3.5–5.1)
Sodium: 138 mmol/L (ref 135–145)

## 2015-03-12 MED ORDER — DIPHENOXYLATE-ATROPINE 2.5-0.025 MG PO TABS: 2.0000 | ORAL_TABLET | Freq: Three times a day (TID) | ORAL | Status: DC | PRN

## 2015-03-12 NOTE — Telephone Encounter (Signed)
Pt is going to Urgent Care.

## 2015-03-12 NOTE — ED Notes (Signed)
C/o diarrhea x couple of days

## 2015-03-12 NOTE — Discharge Instructions (Signed)
Try probiotic of choice, use medicine as needed, continue dietary restrictions over weekend , see your doctor if further problems.

## 2015-03-12 NOTE — ED Provider Notes (Signed)
CSN: [id number]     Arrival date & time 03/12/15  1433 History   First MD Initiated Contact with Patient 03/12/15 1455     Chief Complaint  Patient presents with  . GI Problem   (Consider location/radiation/quality/duration/timing/severity/associated sxs/prior Treatment) Patient is a 39 y.o. female presenting with diarrhea. The history is provided by the patient.  Diarrhea Quality:  Watery and mucous Severity:  Mild Onset quality:  Gradual Number of episodes:  6 Duration:  5 days Progression:  Unchanged Ineffective treatments:  Anti-motility medications and change in diet Associated symptoms: abdominal pain, chills and fever   Associated symptoms: no vomiting   Risk factors comment:  H/o IBS   Past Medical History  Diagnosis Date  . Hypothyroidism     sees Dr. Elyse Hsu  . GERD (gastroesophageal reflux disease)   . Hyperlipidemia   . Hypertension   . Migraine syndrome   . Neuropathy associated with endocrine disorder   . Asthma   . Neck pain   . Insomnia   . Depression     sees Dr. Matilde Haymaker in [place]   . Diabetes mellitus     type 2, on insulin pump, sees Dr. Loanne Drilling   . Morbid obesity    Past Surgical History  Procedure Laterality Date  . Blocked itestinal repair    . Hernia repair    . Cholecystectomy     History reviewed. No pertinent family history. History  Substance Use Topics  . Smoking status: Former Smoker -- 1.00 packs/day    Types: Cigarettes    Quit date: 04/27/2013  . Smokeless tobacco: Never Used     Comment: less than 1 pack per day  . Alcohol Use: 0.0 oz/week    0 Standard drinks or equivalent per week     Comment: rare   OB History    No data available     Review of Systems  Constitutional: Positive for fever and chills.  Gastrointestinal: Positive for abdominal pain and diarrhea. Negative for nausea, vomiting and blood in stool.  Genitourinary: Negative.     Allergies  Exenatide; Penicillins; Tramadol hcl; and  Avocado  Home Medications   Prior to Admission medications   Medication Sig Start Date End Date Taking? Authorizing Provider  albuterol (PROAIR HFA) 108 (90 BASE) MCG/ACT inhaler Inhale 2 puffs into the lungs every 4 (four) hours as needed for wheezing. 04/29/13   Laurey Morale, MD  budesonide-formoterol [facility]) 160-4.5 MCG/ACT inhaler Inhale 2 puffs into the lungs 2 (two) times daily. 04/28/13   Laurey Morale, MD  buPROPion (WELLBUTRIN XL) 300 MG 24 hr tablet Take 300 mg by mouth daily.    Historical Provider, MD  cetirizine (ZYRTEC) 10 MG tablet Take 10 mg by mouth daily.      Historical Provider, MD  cyclobenzaprine (FLEXERIL) 10 MG tablet Take 1 tablet (10 mg total) by mouth 3 (three) times daily as needed for muscle spasms. 04/28/13   Laurey Morale, MD  cycloSPORINE (RESTASIS) 0.05 % ophthalmic emulsion 1 drop 2 (two) times daily.    Historical Provider, MD  diclofenac (VOLTAREN) 75 MG EC tablet Take 1 tablet (75 mg total) by mouth 2 (two) times daily. 08/08/13   Laurey Morale, MD  diphenoxylate-atropine (LOMOTIL) 2.5-0.025 MG per tablet Take 2 tablets by mouth 3 (three) times daily as needed for diarrhea or loose stools. 03/12/15   Billy Fischer, MD  furosemide (LASIX) 20 MG tablet Take 1 tablet (20 mg total) by mouth daily.  07/03/14   Laurey Morale, MD  glucose blood (BAYER CONTOUR NEXT TEST) test strip 1 each by Other route 4 (four) times daily. And lancets 4/day 04/15/13   Renato Shin, MD  HYDROcodone-acetaminophen (NORCO/VICODIN) 5-325 MG per tablet Take 1 tablet by mouth every 6 (six) hours as needed for pain. 08/08/13   Laurey Morale, MD  hydrOXYzine (VISTARIL) 50 MG capsule Take 1 tablet by mouth daily as needed.  10/10/12   Historical Provider, MD  hyoscyamine (LEVSIN) 0.125 MG/5ML ELIX Take 0.125 mg by mouth as needed.      Historical Provider, MD  insulin aspart (NOVOLOG) 100 UNIT/ML injection As directed, for a total of 120 units per day. Patient taking differently: For use in pump,  for a total of 120 units per day. 07/10/14   Renato Shin, MD  levothyroxine (SYNTHROID, LEVOTHROID) 125 MCG tablet Take 1 tablet (125 mcg total) by mouth daily. 07/01/14   Laurey Morale, MD  metFORMIN (GLUCOPHAGE) 1000 MG tablet TAKE 1 TABLET BY MOUTH TWICE A DAY WITH A MEAL 08/31/14   Renato Shin, MD  metoprolol succinate (TOPROL-XL) 25 MG 24 hr tablet TAKE 1/2 TABLET BY MOUTH EVERY DAY 07/01/14   Laurey Morale, MD  Multiple Vitamin (MULTIVITAMIN) tablet Take 1 tablet by mouth daily.      Historical Provider, MD  omeprazole (PRILOSEC) 20 MG capsule TAKE 1 CAPSULE (20 MG TOTAL) BY MOUTH DAILY. 07/01/14   Laurey Morale, MD  ondansetron (ZOFRAN ODT) 8 MG disintegrating tablet Take 1 tablet (8 mg total) by mouth every 8 (eight) hours as needed for nausea. 12/29/13   Harden Mo, MD  pioglitazone (ACTOS) 45 MG tablet TAKE 1 TABLET BY MOUTH EVERY DAY 03/01/15   Renato Shin, MD  potassium chloride SA (KLOR-CON M20) 20 MEQ tablet Take 1 tablet (20 mEq total) by mouth daily. 07/01/14   Laurey Morale, MD  prazosin (MINIPRESS) 5 MG capsule Take 5 mg by mouth at bedtime. Take 2    Historical Provider, MD  promethazine (PHENERGAN) 25 MG tablet Take 25 mg by mouth as needed.      Historical Provider, MD  risperiDONE (RISPERDAL) 2 MG tablet Take 2 mg by mouth daily.     Historical Provider, MD  rizatriptan (MAXALT) 10 MG tablet Take 1 tablet (10 mg total) by mouth as needed for migraine. 09/17/12   Laurey Morale, MD  rosuvastatin (CRESTOR) 40 MG tablet Take 1 tablet (40 mg total) by mouth daily. Patient not taking: Reported on 02/10/2015 07/01/14   Laurey Morale, MD  sertraline (ZOLOFT) 100 MG tablet Take 100 mg by mouth daily. Take 2 every day    Historical Provider, MD  valACYclovir (VALTREX) 1000 MG tablet Take 1 tablet (1,000 mg total) by mouth 2 (two) times daily. 07/01/14   Laurey Morale, MD   BP 124/71 mmHg  Pulse 86  Temp(Src) 99.1 F (37.3 C) (Oral)  Resp 16  SpO2 98% Physical Exam  Constitutional: She is  oriented to person, place, and time. She appears well-developed and well-nourished. No distress.  Pulmonary/Chest: Effort normal and breath sounds normal.  Abdominal: Soft. Bowel sounds are normal. She exhibits no distension and no mass. There is no tenderness. There is no rebound and no guarding.  Neurological: She is alert and oriented to person, place, and time.  Skin: Skin is warm and dry.  Nursing note and vitals reviewed.   ED Course  Procedures (including critical care time) Labs Review Labs  Reviewed  BASIC METABOLIC PANEL - Abnormal; Notable for the following:    Glucose, Bld 208 (*)    All other components within normal limits  CBC WITH DIFFERENTIAL/PLATELET  POCT URINALYSIS DIP (DEVICE)   Cbc, bmet and u/a wnl.  Imaging Review No results found.   MDM   1. Acute diarrhea        Billy Fischer, MD 03/12/15 2146

## 2015-03-12 NOTE — Telephone Encounter (Signed)
DOB: 11/18/1975    Initial Comment Caller states she has had fever and chills all week, also diarrhea Sunday. Only had clear liquids. Diarrhea came back yesterday.   Nurse Assessment  Nurse: Raphael Gibney, RN, Vanita Ingles Date/Time (Eastern Time): 03/12/2015 12:15:17 PM  Confirm and document reason for call. If symptomatic, describe symptoms. ---Caller states she has had diarrhea all week. She is not eating solid food. She is drinking clear liquids. She is having at least 6 diarrhea stools daily. Temp 99-100.5 all week. Highest temp is 101. She is chilling. She is weak. She is nauseated but has not vomited. She is urinating.  Has the patient traveled out of the country within the last 30 days? ---No  Does the patient require triage? ---Yes  Related visit to physician within the last 2 weeks? ---No  Does the PT have any chronic conditions? (i.e. diabetes, asthma, etc.) ---Yes  List chronic conditions. ---IBS; diabetic; POCS; hypothroidism  Did the patient indicate they were pregnant? ---No     Guidelines    Guideline Title Affirmed Question Affirmed Notes  Diarrhea [1] Mucus or pus in stool AND [2] present > 2 days AND [3] diarrhea is more than mild    Final Disposition User   See Physician within 24 Hours [place], Therapist, sports, [place]    Comments  No appts available at [facility]; does not want to go to another office. States she will go to urgent care.

## 2015-03-24 ENCOUNTER — Ambulatory Visit: Payer: BLUE CROSS/BLUE SHIELD | Admitting: Endocrinology

## 2015-03-26 ENCOUNTER — Ambulatory Visit: Payer: Self-pay | Admitting: Family Medicine

## 2015-04-06 ENCOUNTER — Encounter: Payer: Self-pay | Admitting: Endocrinology

## 2015-04-06 ENCOUNTER — Ambulatory Visit (INDEPENDENT_AMBULATORY_CARE_PROVIDER_SITE_OTHER): Payer: BLUE CROSS/BLUE SHIELD | Admitting: Endocrinology

## 2015-04-06 VITALS — BP 136/84 | HR 98 | Temp 99.3°F | Ht 63.0 in | Wt 257.0 lb

## 2015-04-06 DIAGNOSIS — E1042 Type 1 diabetes mellitus with diabetic polyneuropathy: Secondary | ICD-10-CM

## 2015-04-06 MED ORDER — INSULIN ASPART 100 UNIT/ML ~~LOC~~ SOLN: SUBCUTANEOUS | Status: DC

## 2015-04-06 NOTE — Progress Notes (Signed)
Subjective:    Patient ID: Miranda [name], female    DOB: 07-31-1976, 39 y.o.   MRN: [id number]  HPI Pt returns for f/u of diabetes mellitus: DM type: 1 Dx'ed: 9562 Complications: polyneuropathy and retinopathy Therapy: insulin since dx GDM: never DKA: never Severe hypoglycemia: never Pancreatitis: never Other: she has a one-touch ping insulin infusion pump, and a medtronic continuous glucose monitor; she says she takes the metformin and actos for the PCOS and NASH, rather than DM; she declines weight loss surgery.   Interval history:  continue basal rate of 2.5 units/hr, except for 3.5 units/hr, 3 am to 6 am continue mealtime bolus of 1 unit/ 6 grams carbohydrate, but she takes takes 5 extra units at lunch. continue correction bolus (which some people call "sensitivity," or "insulin sensitivity ratio," or just "isr") of 1 unit for each 20 by which your glucose exceeds 100.   no cbg record, but states she has had only 1 episode of hypoglycemia recently, and this was mild.  It happened in the afternoon.  She takes a total of approx 100 total units per day.   i have reviewed continuous glucose monitor data, and scanned into the record.  She eats only 2 meals per day.   Past Medical History  Diagnosis Date  . Hypothyroidism     sees Dr. Elyse Hsu  . GERD (gastroesophageal reflux disease)   . Hyperlipidemia   . Hypertension   . Migraine syndrome   . Neuropathy associated with endocrine disorder   . Asthma   . Neck pain   . Insomnia   . Depression     sees Dr. Matilde Haymaker in [place]   . Diabetes mellitus     type 2, on insulin pump, sees Dr. Loanne Drilling   . Morbid obesity     Past Surgical History  Procedure Laterality Date  . Blocked itestinal repair    . Hernia repair    . Cholecystectomy      History   Social History  . Marital Status: Married    Spouse Name: N/A  . Number of Children: N/A  . Years of Education: N/A   Occupational History  . Not on file.    Social History Main Topics  . Smoking status: Former Smoker -- 1.00 packs/day    Types: Cigarettes    Quit date: 04/27/2013  . Smokeless tobacco: Never Used     Comment: less than 1 pack per day  . Alcohol Use: 0.0 oz/week    0 Standard drinks or equivalent per week     Comment: rare  . Drug Use: No  . Sexual Activity: Yes   Other Topics Concern  . Not on file   Social History Narrative    Current Outpatient Prescriptions on File Prior to Visit  Medication Sig Dispense Refill  . albuterol (PROAIR HFA) 108 (90 BASE) MCG/ACT inhaler Inhale 2 puffs into the lungs every 4 (four) hours as needed for wheezing. 8.5 g 11  . budesonide-formoterol (SYMBICORT) 160-4.5 MCG/ACT inhaler Inhale 2 puffs into the lungs 2 (two) times daily. 30.4 g 3  . buPROPion (WELLBUTRIN XL) 300 MG 24 hr tablet Take 300 mg by mouth daily.    . cetirizine (ZYRTEC) 10 MG tablet Take 10 mg by mouth daily.      . cyclobenzaprine (FLEXERIL) 10 MG tablet Take 1 tablet (10 mg total) by mouth 3 (three) times daily as needed for muscle spasms. 90 tablet 3  . cycloSPORINE (RESTASIS) 0.05 % ophthalmic  emulsion 1 drop 2 (two) times daily.    . diclofenac (VOLTAREN) 75 MG EC tablet Take 1 tablet (75 mg total) by mouth 2 (two) times daily. 60 tablet 5  . diphenoxylate-atropine (LOMOTIL) 2.5-0.025 MG per tablet Take 2 tablets by mouth 3 (three) times daily as needed for diarrhea or loose stools. 15 tablet 0  . furosemide (LASIX) 20 MG tablet Take 1 tablet (20 mg total) by mouth daily. 90 tablet 3  . glucose blood (BAYER CONTOUR NEXT TEST) test strip 1 each by Other route 4 (four) times daily. And lancets 4/day 120 each 12  . HYDROcodone-acetaminophen (NORCO/VICODIN) 5-325 MG per tablet Take 1 tablet by mouth every 6 (six) hours as needed for pain. 60 tablet 0  . hydrOXYzine (VISTARIL) 50 MG capsule Take 1 tablet by mouth daily as needed.     . hyoscyamine (LEVSIN) 0.125 MG/5ML ELIX Take 0.125 mg by mouth as needed.      Marland Kitchen  levothyroxine (SYNTHROID, LEVOTHROID) 125 MCG tablet Take 1 tablet (125 mcg total) by mouth daily. 90 tablet 3  . metFORMIN (GLUCOPHAGE) 1000 MG tablet TAKE 1 TABLET BY MOUTH TWICE A DAY WITH A MEAL 60 tablet 0  . metoprolol succinate (TOPROL-XL) 25 MG 24 hr tablet TAKE 1/2 TABLET BY MOUTH EVERY DAY 90 tablet 3  . Multiple Vitamin (MULTIVITAMIN) tablet Take 1 tablet by mouth daily.      Marland Kitchen omeprazole (PRILOSEC) 20 MG capsule TAKE 1 CAPSULE (20 MG TOTAL) BY MOUTH DAILY. 90 capsule 3  . ondansetron (ZOFRAN ODT) 8 MG disintegrating tablet Take 1 tablet (8 mg total) by mouth every 8 (eight) hours as needed for nausea. 20 tablet 0  . pioglitazone (ACTOS) 45 MG tablet TAKE 1 TABLET BY MOUTH EVERY DAY 90 tablet 0  . potassium chloride SA (KLOR-CON M20) 20 MEQ tablet Take 1 tablet (20 mEq total) by mouth daily. 90 tablet 3  . prazosin (MINIPRESS) 5 MG capsule Take 5 mg by mouth at bedtime. Take 2    . promethazine (PHENERGAN) 25 MG tablet Take 25 mg by mouth as needed.      . risperiDONE (RISPERDAL) 2 MG tablet Take 2 mg by mouth daily.     . rizatriptan (MAXALT) 10 MG tablet Take 1 tablet (10 mg total) by mouth as needed for migraine. 10 tablet 11  . rosuvastatin (CRESTOR) 40 MG tablet Take 1 tablet (40 mg total) by mouth daily. 90 tablet 3  . sertraline (ZOLOFT) 100 MG tablet Take 100 mg by mouth daily. Take 2 every day    . valACYclovir (VALTREX) 1000 MG tablet Take 1 tablet (1,000 mg total) by mouth 2 (two) times daily. 60 tablet 5   No current facility-administered medications on file prior to visit.    Allergies  Allergen Reactions  . Exenatide     hives  . Penicillins   . Tramadol Hcl   . Avocado Diarrhea and Other (See Comments)    Severe cramping, sweats, and diarrhea in upper GI/stomach    No family history on file.  BP 136/84 mmHg  Pulse 98  Temp(Src) 99.3 F (37.4 C) (Oral)  Ht 5\' 3"  (1.6 m)  Wt 257 lb (116.574 kg)  BMI 45.54 kg/m2  SpO2 97%  Review of Systems She denies  LOC    Objective:   Physical Exam VITAL SIGNS:  See vs page GENERAL: no distress Pulses: dorsalis pedis intact bilat.   MSK: no deformity of the feet CV: no leg edema Skin:  no ulcer on the feet.  normal color and temp on the feet. Neuro: sensation is intact to touch on the feet.    outside test results are reviewed: A1c=9.9    Assessment & Plan:  DM: she needs increased rx.    Patient is advised the following: Patient Instructions  Please change basal rate to 2.8 units/hr, except for 3.0 units/hr, 3 am to 6 am continue mealtime bolus of 1 unit/ 6 grams carbohydrate, but she takes takes 5 extra units at lunch. continue correction bolus (which some people call "sensitivity," or "insulin sensitivity ratio," or just "isr") of 1 unit for each 20 by which your glucose exceeds 100.  check your blood sugar 4 times a day: before the 3 meals, and at bedtime.  also check if you have symptoms of your blood sugar being too high or too low.  please keep a record of the readings and bring it to your next appointment here.  You can write it on any piece of paper.  please call us sooner if your blood sugar goes below 70, or if you have a lot of readings over 200.   Please come back for a follow-up appointment in 2 months.

## 2015-04-06 NOTE — Patient Instructions (Addendum)
Please change basal rate to 2.8 units/hr, except for 3.0 units/hr, 3 am to 6 am continue mealtime bolus of 1 unit/ 6 grams carbohydrate, but she takes takes 5 extra units at lunch. continue correction bolus (which some people call "sensitivity," or "insulin sensitivity ratio," or just "isr") of 1 unit for each 20 by which your glucose exceeds 100.  check your blood sugar 4 times a day: before the 3 meals, and at bedtime.  also check if you have symptoms of your blood sugar being too high or too low.  please keep a record of the readings and bring it to your next appointment here.  You can write it on any piece of paper.  please call us sooner if your blood sugar goes below 70, or if you have a lot of readings over 200.   Please come back for a follow-up appointment in 2 months.

## 2015-06-08 ENCOUNTER — Ambulatory Visit: Payer: BLUE CROSS/BLUE SHIELD | Admitting: Endocrinology

## 2015-07-01 ENCOUNTER — Other Ambulatory Visit: Payer: Self-pay | Admitting: Endocrinology

## 2015-07-05 ENCOUNTER — Other Ambulatory Visit: Payer: Self-pay | Admitting: Family Medicine

## 2015-07-07 NOTE — Telephone Encounter (Signed)
I spoke with pt and she is going to schedule to have labs done.

## 2015-08-04 ENCOUNTER — Telehealth: Payer: Self-pay | Admitting: Endocrinology

## 2015-08-04 NOTE — Telephone Encounter (Signed)
See note below and please advise, Thanks! 

## 2015-08-04 NOTE — Telephone Encounter (Signed)
Patient advised of note below. Pt stated she was not having nay n/v/sob. Scheduled for 8 am tomorrow.

## 2015-08-04 NOTE — Telephone Encounter (Signed)
please call patient: Please verify no n/v/sob Please take your "correction" bolus every hour if necessary. Ov tomorrow

## 2015-08-04 NOTE — Telephone Encounter (Signed)
Patient stated that she is having a hyperglycemic attack, what should she do

## 2015-08-05 ENCOUNTER — Ambulatory Visit (INDEPENDENT_AMBULATORY_CARE_PROVIDER_SITE_OTHER): Payer: BLUE CROSS/BLUE SHIELD | Admitting: Endocrinology

## 2015-08-05 ENCOUNTER — Encounter: Payer: Self-pay | Admitting: Endocrinology

## 2015-08-05 VITALS — BP 134/94 | HR 111 | Temp 97.7°F | Ht 63.0 in | Wt 254.0 lb

## 2015-08-05 DIAGNOSIS — E1042 Type 1 diabetes mellitus with diabetic polyneuropathy: Secondary | ICD-10-CM | POA: Diagnosis not present

## 2015-08-05 LAB — POCT GLYCOSYLATED HEMOGLOBIN (HGB A1C): Hemoglobin A1C: 9.9

## 2015-08-05 MED ORDER — INSULIN NPH ISOPHANE & REGULAR (70-30) 100 UNIT/ML ~~LOC~~ SUSP: SUBCUTANEOUS | Status: DC

## 2015-08-05 NOTE — Progress Notes (Signed)
Subjective:    Patient ID: Miranda [name], female    DOB: 1976-03-29, 39 y.o.   MRN: [id number]  HPI Pt returns for f/u of diabetes mellitus: DM type: 1 Dx'ed: 3419 Complications: polyneuropathy and retinopathy Therapy: insulin since dx GDM: never DKA: never Severe hypoglycemia: never Pancreatitis: never Other: she has a one-touch ping insulin infusion pump, and a medtronic continuous glucose monitor; she says she takes the metformin and actos for the PCOS and NASH, rather than DM; she declines weight loss surgery.   Interval history:  She says she has lost her continuous glucose monitor.  Meter is downloaded today, and the printout is scanned into the record.  It varies from 70-450.  It is in general higher as the day goes on.  There are only a few cbg's.  She uses the pump only approx every other week.  She says she cannot get pump supplies now, due to loss of insurance.   Past Medical History  Diagnosis Date  . Hypothyroidism     sees Dr. Elyse Hsu  . GERD (gastroesophageal reflux disease)   . Hyperlipidemia   . Hypertension   . Migraine syndrome   . Neuropathy associated with endocrine disorder ([place])   . Asthma   . Neck pain   . Insomnia   . Depression     sees Dr. Matilde Haymaker in [place]   . Diabetes mellitus     type 2, on insulin pump, sees Dr. Loanne Drilling   . Morbid obesity [facility])     Past Surgical History  Procedure Laterality Date  . Blocked itestinal repair    . Hernia repair    . Cholecystectomy      Social History   Social History  . Marital Status: Married    Spouse Name: N/A  . Number of Children: N/A  . Years of Education: N/A   Occupational History  . Not on file.   Social History Main Topics  . Smoking status: Former Smoker -- 1.00 packs/day    Types: Cigarettes    Quit date: 04/27/2013  . Smokeless tobacco: Never Used     Comment: less than 1 pack per day  . Alcohol Use: 0.0 oz/week    0 Standard drinks or equivalent per week   Comment: rare  . Drug Use: No  . Sexual Activity: Yes   Other Topics Concern  . Not on file   Social History Narrative    Current Outpatient Prescriptions on File Prior to Visit  Medication Sig Dispense Refill  . albuterol (PROAIR HFA) 108 (90 BASE) MCG/ACT inhaler Inhale 2 puffs into the lungs every 4 (four) hours as needed for wheezing. 8.5 g 11  . budesonide-formoterol (SYMBICORT) 160-4.5 MCG/ACT inhaler Inhale 2 puffs into the lungs 2 (two) times daily. 30.4 g 3  . buPROPion (WELLBUTRIN XL) 300 MG 24 hr tablet Take 300 mg by mouth daily.    . cetirizine (ZYRTEC) 10 MG tablet Take 10 mg by mouth daily.      . cyclobenzaprine (FLEXERIL) 10 MG tablet Take 1 tablet (10 mg total) by mouth 3 (three) times daily as needed for muscle spasms. 90 tablet 3  . cycloSPORINE (RESTASIS) 0.05 % ophthalmic emulsion 1 drop 2 (two) times daily.    . diclofenac (VOLTAREN) 75 MG EC tablet Take 1 tablet (75 mg total) by mouth 2 (two) times daily. 60 tablet 5  . diphenoxylate-atropine (LOMOTIL) 2.5-0.025 MG per tablet Take 2 tablets by mouth 3 (three) times daily as needed  for diarrhea or loose stools. 15 tablet 0  . furosemide (LASIX) 20 MG tablet Take 1 tablet (20 mg total) by mouth daily. 90 tablet 3  . glucose blood (BAYER CONTOUR NEXT TEST) test strip 1 each by Other route 4 (four) times daily. And lancets 4/day 120 each 12  . HYDROcodone-acetaminophen (NORCO/VICODIN) 5-325 MG per tablet Take 1 tablet by mouth every 6 (six) hours as needed for pain. 60 tablet 0  . hydrOXYzine (VISTARIL) 50 MG capsule Take 1 tablet by mouth daily as needed.     . hyoscyamine (LEVSIN) 0.125 MG/5ML ELIX Take 0.125 mg by mouth as needed.      Marland Kitchen levothyroxine (SYNTHROID, LEVOTHROID) 125 MCG tablet TAKE 1 TABLET EVERY DAY 90 tablet 0  . metFORMIN (GLUCOPHAGE) 1000 MG tablet TAKE 1 TABLET BY MOUTH TWICE A DAY WITH A MEAL 60 tablet 0  . metoprolol succinate (TOPROL-XL) 25 MG 24 hr tablet TAKE 1/2 TABLET EVERY DAY 90 tablet 0    . Multiple Vitamin (MULTIVITAMIN) tablet Take 1 tablet by mouth daily.      Marland Kitchen omeprazole (PRILOSEC) 20 MG capsule TAKE 1 CAPSULE (20 MG TOTAL) BY MOUTH DAILY. 90 capsule 3  . ondansetron (ZOFRAN ODT) 8 MG disintegrating tablet Take 1 tablet (8 mg total) by mouth every 8 (eight) hours as needed for nausea. 20 tablet 0  . pioglitazone (ACTOS) 45 MG tablet TAKE 1 TABLET BY MOUTH EVERY DAY 90 tablet 0  . potassium chloride SA (KLOR-CON M20) 20 MEQ tablet Take 1 tablet (20 mEq total) by mouth daily. 90 tablet 3  . prazosin (MINIPRESS) 5 MG capsule Take 5 mg by mouth at bedtime. Take 2    . Probiotic Product (PROBIOTIC COLON SUPPORT PO) Take by mouth.    . progesterone (PROMETRIUM) 200 MG capsule   3  . promethazine (PHENERGAN) 25 MG tablet Take 25 mg by mouth as needed.      . risperiDONE (RISPERDAL) 2 MG tablet Take 2 mg by mouth daily.     . rizatriptan (MAXALT) 10 MG tablet Take 1 tablet (10 mg total) by mouth as needed for migraine. 10 tablet 11  . rosuvastatin (CRESTOR) 40 MG tablet Take 1 tablet (40 mg total) by mouth daily. 90 tablet 3  . sertraline (ZOLOFT) 100 MG tablet Take 100 mg by mouth daily. Take 2 every day    . valACYclovir (VALTREX) 1000 MG tablet Take 1 tablet (1,000 mg total) by mouth 2 (two) times daily. 60 tablet 5   No current facility-administered medications on file prior to visit.    Allergies  Allergen Reactions  . Exenatide     hives  . Penicillins   . Tramadol Hcl   . Avocado Diarrhea and Other (See Comments)    Severe cramping, sweats, and diarrhea in upper GI/stomach    No family history on file.  BP 134/94 mmHg  Pulse 111  Temp(Src) 97.7 F (36.5 C) (Oral)  Ht 5\' 3"  (1.6 m)  Wt 254 lb (115.214 kg)  BMI 45.01 kg/m2  SpO2 97%  Review of Systems She denies hypoglycemia.      Objective:   Physical Exam VITAL SIGNS:  See vs page GENERAL: no distress Pulses: dorsalis pedis intact bilat.   MSK: no deformity of the feet. CV: no leg edema. Skin:   no ulcer on the feet.  normal color and temp on the feet. Neuro: sensation is intact to touch on the feet Ext; There is bilateral onychomycosis of the toenails.  Lab Results  Component Value Date   HGBA1C 9.9 08/05/2015      Assessment & Plan:  DM: therapy limited by pt's request for least expensive meds.  Patient is advised the following: Patient Instructions  For now, take [facility] 70/30 insulin, 70 units with breakfast, and 30 units supper. check your blood sugar twice a day.  vary the time of day when you check, between before the 3 meals, and at bedtime.  also check if you have symptoms of your blood sugar being too high or too low.  please keep a record of the readings and bring it to your next appointment here.  You can write it on any piece of paper.  please call us sooner if your blood sugar goes below 70, or if you have a lot of readings over 200. Please call us next week, to tell us how the blood sugar is doing.   Please come back for a follow-up appointment in 2 months.

## 2015-08-05 NOTE — Patient Instructions (Addendum)
For now, take [facility] 70/30 insulin, 70 units with breakfast, and 30 units supper. check your blood sugar twice a day.  vary the time of day when you check, between before the 3 meals, and at bedtime.  also check if you have symptoms of your blood sugar being too high or too low.  please keep a record of the readings and bring it to your next appointment here.  You can write it on any piece of paper.  please call us sooner if your blood sugar goes below 70, or if you have a lot of readings over 200. Please call us next week, to tell us how the blood sugar is doing.   Please come back for a follow-up appointment in 2 months.

## 2015-08-06 ENCOUNTER — Ambulatory Visit: Payer: BLUE CROSS/BLUE SHIELD | Admitting: Endocrinology

## 2015-10-05 ENCOUNTER — Ambulatory Visit: Payer: BLUE CROSS/BLUE SHIELD | Admitting: Endocrinology

## 2015-10-27 ENCOUNTER — Other Ambulatory Visit: Payer: Self-pay | Admitting: Family Medicine

## 2015-10-28 ENCOUNTER — Other Ambulatory Visit: Payer: Self-pay | Admitting: Family Medicine

## 2015-10-29 ENCOUNTER — Other Ambulatory Visit: Payer: Self-pay | Admitting: Family Medicine

## 2015-10-29 ENCOUNTER — Other Ambulatory Visit: Payer: Self-pay | Admitting: Endocrinology

## 2015-10-29 NOTE — Telephone Encounter (Signed)
Patient has appt 11/02/15 - went ahead and sent in refill for #90 with 0 refills.

## 2015-10-31 HISTORY — PX: BREAST BIOPSY: SHX20

## 2015-11-02 ENCOUNTER — Encounter: Payer: Self-pay | Admitting: Family Medicine

## 2015-11-02 ENCOUNTER — Ambulatory Visit (INDEPENDENT_AMBULATORY_CARE_PROVIDER_SITE_OTHER): Payer: BLUE CROSS/BLUE SHIELD | Admitting: Family Medicine

## 2015-11-02 VITALS — BP 102/60 | HR 107 | Wt 257.1 lb

## 2015-11-02 DIAGNOSIS — J209 Acute bronchitis, unspecified: Secondary | ICD-10-CM | POA: Diagnosis not present

## 2015-11-02 DIAGNOSIS — E1042 Type 1 diabetes mellitus with diabetic polyneuropathy: Secondary | ICD-10-CM | POA: Diagnosis not present

## 2015-11-02 MED ORDER — METHYLPREDNISOLONE ACETATE PF 80 MG/ML IJ SUSP
120.0000 mg | Freq: Once | INTRAMUSCULAR | Status: AC
  Administered 2015-11-02: 120 mg via INTRAMUSCULAR

## 2015-11-02 MED ORDER — DIPHENOXYLATE-ATROPINE 2.5-0.025 MG PO TABS: 2.0000 | ORAL_TABLET | Freq: Three times a day (TID) | ORAL | Status: DC | PRN

## 2015-11-02 MED ORDER — AZITHROMYCIN 250 MG PO TABS: ORAL_TABLET | ORAL | Status: DC

## 2015-11-02 NOTE — Addendum Note (Signed)
Addended by: Sandria Bales B on: 11/02/2015 05:19 PM   Modules accepted: Orders

## 2015-11-02 NOTE — Progress Notes (Signed)
Pre visit review using our clinic review tool, if applicable. No additional management support is needed unless otherwise documented below in the visit note. 

## 2015-11-02 NOTE — Progress Notes (Signed)
   Subjective:    Patient ID: Miranda [name], female    DOB: May 27, 1976, 40 y.o.   MRN: [id number]  HPI Here for one week of chest tightness and coughing up green sputum. Using Robitussin. No fever. Also she asks to see a different endocrinologist for her diabetes. She has been seeing Dr. Loanne Drilling but she feels she needs a fresh perspective.    Review of Systems  Constitutional: Negative.   HENT: Positive for congestion, postnasal drip and sinus pressure. Negative for ear pain and sore throat.   Eyes: Negative.   Respiratory: Positive for cough and chest tightness.   Endocrine: Negative.   Neurological: Negative.        Objective:   Physical Exam  Constitutional: She is oriented to person, place, and time. She appears well-developed and well-nourished.  HENT:  Right Ear: External ear normal.  Left Ear: External ear normal.  Nose: Nose normal.  Mouth/Throat: Oropharynx is clear and moist.  Eyes: Conjunctivae are normal.  Neck: No thyromegaly present.  Cardiovascular: Normal rate, regular rhythm, normal heart sounds and intact distal pulses.   Pulmonary/Chest: Effort normal and breath sounds normal.  Lymphadenopathy:    She has no cervical adenopathy.  Neurological: She is alert and oriented to person, place, and time.          Assessment & Plan:  Treat the bronchitis with a Zpack. Refer to a new endocrinologist.

## 2015-11-11 ENCOUNTER — Telehealth: Payer: Self-pay | Admitting: Family Medicine

## 2015-11-11 NOTE — Telephone Encounter (Signed)
Pt said she is having a lot of coughing spells and is asking for something that will control this cough.       [phone]   Pharmacy ; [facility] [address] [facility] [address]

## 2015-11-12 ENCOUNTER — Ambulatory Visit (INDEPENDENT_AMBULATORY_CARE_PROVIDER_SITE_OTHER): Payer: BLUE CROSS/BLUE SHIELD | Admitting: Psychology

## 2015-11-12 DIAGNOSIS — F4481 Dissociative identity disorder: Secondary | ICD-10-CM

## 2015-11-12 DIAGNOSIS — F4310 Post-traumatic stress disorder, unspecified: Secondary | ICD-10-CM | POA: Diagnosis not present

## 2015-11-12 MED ORDER — HYDROCODONE-HOMATROPINE 5-1.5 MG/5ML PO SYRP: 5.0000 mL | ORAL_SOLUTION | ORAL | Status: DC | PRN

## 2015-11-12 NOTE — Telephone Encounter (Signed)
rx ready to pick up

## 2015-11-12 NOTE — Telephone Encounter (Signed)
Script is ready for pick up and I spoke with pt.  

## 2015-11-18 ENCOUNTER — Other Ambulatory Visit: Payer: Self-pay | Admitting: Obstetrics and Gynecology

## 2015-11-18 DIAGNOSIS — N6320 Unspecified lump in the left breast, unspecified quadrant: Secondary | ICD-10-CM

## 2015-11-24 ENCOUNTER — Encounter: Payer: Self-pay | Admitting: Gastroenterology

## 2015-11-24 ENCOUNTER — Ambulatory Visit
Admission: RE | Admit: 2015-11-24 | Discharge: 2015-11-24 | Disposition: A | Discharge status: Home / Self Care | Payer: No Typology Code available for payment source | Source: Ambulatory Visit | Attending: Obstetrics and Gynecology | Admitting: Obstetrics and Gynecology

## 2015-11-24 ENCOUNTER — Other Ambulatory Visit: Payer: Self-pay | Admitting: Obstetrics and Gynecology

## 2015-11-24 ENCOUNTER — Ambulatory Visit
Admission: RE | Admit: 2015-11-24 | Discharge: 2015-11-24 | Disposition: A | Discharge status: Home / Self Care | Payer: BLUE CROSS/BLUE SHIELD | Source: Ambulatory Visit | Attending: Obstetrics and Gynecology | Admitting: Obstetrics and Gynecology

## 2015-11-24 DIAGNOSIS — N6320 Unspecified lump in the left breast, unspecified quadrant: Secondary | ICD-10-CM

## 2015-11-26 ENCOUNTER — Telehealth: Payer: Self-pay | Admitting: Family Medicine

## 2015-11-26 NOTE — Telephone Encounter (Signed)
Pt returned your call from 11/12/15 she said it was concerning a referral

## 2015-11-29 ENCOUNTER — Ambulatory Visit
Admission: RE | Admit: 2015-11-29 | Discharge: 2015-11-29 | Disposition: A | Discharge status: Home / Self Care | Payer: No Typology Code available for payment source | Source: Ambulatory Visit | Attending: Obstetrics and Gynecology | Admitting: Obstetrics and Gynecology

## 2015-11-29 ENCOUNTER — Ambulatory Visit (INDEPENDENT_AMBULATORY_CARE_PROVIDER_SITE_OTHER): Payer: BLUE CROSS/BLUE SHIELD | Admitting: Psychology

## 2015-11-29 ENCOUNTER — Telehealth: Payer: Self-pay | Admitting: Family Medicine

## 2015-11-29 ENCOUNTER — Other Ambulatory Visit: Payer: Self-pay | Admitting: Obstetrics and Gynecology

## 2015-11-29 DIAGNOSIS — F4323 Adjustment disorder with mixed anxiety and depressed mood: Secondary | ICD-10-CM | POA: Diagnosis not present

## 2015-11-29 DIAGNOSIS — N6320 Unspecified lump in the left breast, unspecified quadrant: Secondary | ICD-10-CM

## 2015-11-29 NOTE — Telephone Encounter (Signed)
I already did this referral. Please ask Neoma Laming where this stands now

## 2015-11-29 NOTE — Telephone Encounter (Signed)
I left a voice message for pt to call back or send [place] a my chart message. We need more information, not sure exactly what she needs?

## 2015-11-29 NOTE — Telephone Encounter (Signed)
Pt is waiting on a referral for endocrinology.

## 2015-12-01 NOTE — Telephone Encounter (Signed)
I have called this pt several times  asked this pt to call  Dr Buddy Duty office .Per jennifer  Their office is not able to schedule pt she must contact their billing office -Dr. Delrae Rend Address: [address] [place], [place], [place] [address] Phone: [phone] Left a msg her 3 messages for her have not heard from the pt

## 2015-12-02 ENCOUNTER — Other Ambulatory Visit (INDEPENDENT_AMBULATORY_CARE_PROVIDER_SITE_OTHER): Payer: BLUE CROSS/BLUE SHIELD

## 2015-12-02 ENCOUNTER — Ambulatory Visit (INDEPENDENT_AMBULATORY_CARE_PROVIDER_SITE_OTHER): Payer: BLUE CROSS/BLUE SHIELD | Admitting: Physician Assistant

## 2015-12-02 ENCOUNTER — Encounter: Payer: Self-pay | Admitting: Physician Assistant

## 2015-12-02 VITALS — BP 76/50 | HR 96 | Ht 61.5 in | Wt 256.1 lb

## 2015-12-02 DIAGNOSIS — R1013 Epigastric pain: Secondary | ICD-10-CM

## 2015-12-02 DIAGNOSIS — R197 Diarrhea, unspecified: Secondary | ICD-10-CM | POA: Diagnosis not present

## 2015-12-02 DIAGNOSIS — R140 Abdominal distension (gaseous): Secondary | ICD-10-CM

## 2015-12-02 DIAGNOSIS — R1012 Left upper quadrant pain: Secondary | ICD-10-CM

## 2015-12-02 LAB — CBC WITH DIFFERENTIAL/PLATELET
Basophils Absolute: 0.1 10*3/uL (ref 0.0–0.1)
Basophils Relative: 0.6 % (ref 0.0–3.0)
Eosinophils Absolute: 0.2 10*3/uL (ref 0.0–0.7)
Eosinophils Relative: 2.4 % (ref 0.0–5.0)
HCT: 44.4 % (ref 36.0–46.0)
Hemoglobin: 14.8 g/dL (ref 12.0–15.0)
LYMPHS ABS: 3.6 10*3/uL (ref 0.7–4.0)
LYMPHS PCT: 36.9 % (ref 12.0–46.0)
MCHC: 33.4 g/dL (ref 30.0–36.0)
MCV: 92.4 fl (ref 78.0–100.0)
MONOS PCT: 6.5 % (ref 3.0–12.0)
Monocytes Absolute: 0.6 10*3/uL (ref 0.1–1.0)
Neutro Abs: 5.2 10*3/uL (ref 1.4–7.7)
Neutrophils Relative %: 53.6 % (ref 43.0–77.0)
Platelets: 217 10*3/uL (ref 150.0–400.0)
RBC: 4.8 Mil/uL (ref 3.87–5.11)
RDW: 13.4 % (ref 11.5–15.5)
WBC: 9.6 10*3/uL (ref 4.0–10.5)

## 2015-12-02 LAB — SEDIMENTATION RATE: Sed Rate: 19 mm/hr (ref 0–22)

## 2015-12-02 LAB — HIGH SENSITIVITY CRP: CRP, High Sensitivity: 11.56 mg/L — ABNORMAL HIGH (ref 0.000–5.000)

## 2015-12-02 LAB — IGA: IGA: 136 mg/dL (ref 68–378)

## 2015-12-02 MED ORDER — DICYCLOMINE HCL 10 MG PO CAPS: 10.0000 mg | ORAL_CAPSULE | Freq: Three times a day (TID) | ORAL | Status: DC

## 2015-12-02 NOTE — Progress Notes (Signed)
Patient ID: Miranda [name], female   DOB: [birth year], 40 y.o.   MRN: [id number]   Subjective:    Patient ID: Miranda [name], female    DOB: [birth year], 40 y.o.   MRN: [id number]  HPI  Miranda [name] is a 40 year old white female known to Dr. Fuller Plan to had been seen here several years ago, no procedures were done. She says that she was having gallbladder problems at the time and had been referred for cholecystectomy which was done in 2011 per Dr. Donne Hazel. Patient comes in today with complaints of intermittent left-sided abdominal pain and frequent bouts of diarrhea which has been going on for several years but worsened recently. She is morbidly obese has insulin-dependent diabetes mellitus, hypertension, polycystic ovaries, and psychiatric disorder with previous diagnosis of a dissociative personality disorder. Patient says she feels that she's had IBS issues for 15-20 years exacerbated by certain foods, coffee etc. She is not lactose intolerant. She says over the past year she has  gotten to the point where she can't eat hardly anything other than very bland high carb foods which are not good for her diabetes. If she eats fruits that she doubles spicy foods etc. she will have urgent postprandial diarrhea. Her usual at this point is to have diarrhea 3-4 times daily postprandially generally liquid stool sometimes "poorly". She has not noticed any blood. She also has left-sided abdominal pain which seems to be exacerbated by eating and is sometimes sharp. Her appetite is been fine her weight has been stable. She has no complaints of heartburn or indigestion or dysphagia. She is on chronic PPI therapy. 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 Family history unknown/adopted. No recent imaging.  Review of Systems Pertinent positive and negative review of systems were noted in the above HPI section.  All other review of systems was otherwise negative.  Outpatient Encounter  Prescriptions as of 12/02/2015  Medication Sig  . albuterol (PROAIR HFA) 108 (90 BASE) MCG/ACT inhaler Inhale 2 puffs into the lungs every 4 (four) hours as needed for wheezing.  Marland Kitchen buPROPion (WELLBUTRIN XL) 300 MG 24 hr tablet Take 300 mg by mouth daily.  . cyclobenzaprine (FLEXERIL) 10 MG tablet Take 1 tablet (10 mg total) by mouth 3 (three) times daily as needed for muscle spasms.  . diclofenac (VOLTAREN) 75 MG EC tablet Take 1 tablet (75 mg total) by mouth 2 (two) times daily. (Patient taking differently: Take 75 mg by mouth as needed. )  . diphenoxylate-atropine (LOMOTIL) 2.5-0.025 MG tablet Take 2 tablets by mouth 3 (three) times daily as needed for diarrhea or loose stools.  Marland Kitchen Fexofenadine HCl (ALLEGRA PO) Take 1 tablet by mouth daily.  . furosemide (LASIX) 20 MG tablet Take 1 tablet (20 mg total) by mouth daily.  . Glucos-Chondroit-Hyaluron-MSM (GLUCOSAMINE CHONDROITIN JOINT PO) Take 2 tablets by mouth daily.  Marland Kitchen glucose blood (BAYER CONTOUR NEXT TEST) test strip 1 each by Other route 4 (four) times daily. And lancets 4/day  . HYDROcodone-acetaminophen (NORCO/VICODIN) 5-325 MG per tablet Take 1 tablet by mouth every 6 (six) hours as needed for pain.  . hydrOXYzine (VISTARIL) 50 MG capsule Take 1 tablet by mouth daily as needed.   . hyoscyamine (LEVSIN) 0.125 MG/5ML ELIX Take 0.125 mg by mouth as needed.    . insulin NPH-regular Human (NOVOLIN 70/30) (70-30) 100 UNIT/ML injection 70 units with breakfast, and 30 units with supper.  . levothyroxine (SYNTHROID, LEVOTHROID) 125 MCG tablet TAKE ONE TABLET BY MOUTH ONCE  DAILY  . Menaquinone-7 (VITAMIN K2 PO) Take 200 mg by mouth daily.  . metFORMIN (GLUCOPHAGE) 1000 MG tablet TAKE 1 TABLET BY MOUTH TWICE A DAY WITH A MEAL  . metoprolol succinate (TOPROL-XL) 25 MG 24 hr tablet TAKE ONE-HALF TABLET BY MOUTH ONCE DAILY  . MILK THISTLE PO Take 2 capsules by mouth daily.  Marland Kitchen omeprazole (PRILOSEC) 20 MG capsule TAKE 1 CAPSULE BY MOUTH DAILY  . ondansetron  (ZOFRAN ODT) 8 MG disintegrating tablet Take 1 tablet (8 mg total) by mouth every 8 (eight) hours as needed for nausea.  . pioglitazone (ACTOS) 45 MG tablet TAKE 1 TABLET BY MOUTH EVERY DAY  . prazosin (MINIPRESS) 5 MG capsule Take 5 mg by mouth at bedtime. Take 2  . risperiDONE (RISPERDAL) 2 MG tablet Take 2 mg by mouth daily.   . rizatriptan (MAXALT) 10 MG tablet Take 1 tablet (10 mg total) by mouth as needed for migraine.  . sertraline (ZOLOFT) 100 MG tablet Take 100 mg by mouth daily. Take 2 every day  . cycloSPORINE (RESTASIS) 0.05 % ophthalmic emulsion 1 drop 2 (two) times daily. Reported on 12/02/2015  . dicyclomine (BENTYL) 10 MG capsule Take 1 capsule (10 mg total) by mouth 3 (three) times daily before meals.  . Multiple Vitamin (MULTIVITAMIN) tablet Take 1 tablet by mouth daily. Reported on 12/02/2015  . potassium chloride SA (KLOR-CON M20) 20 MEQ tablet Take 1 tablet (20 mEq total) by mouth daily. (Patient not taking: Reported on 11/02/2015)  . Probiotic Product (PROBIOTIC COLON SUPPORT PO) Take by mouth. Reported on 12/02/2015  . progesterone (PROMETRIUM) 200 MG capsule Reported on 12/02/2015  . rosuvastatin (CRESTOR) 40 MG tablet Take 1 tablet (40 mg total) by mouth daily. (Patient not taking: Reported on 12/02/2015)  . valACYclovir (VALTREX) 1000 MG tablet Take 1 tablet (1,000 mg total) by mouth 2 (two) times daily. (Patient taking differently: Take 1,000 mg by mouth as needed. With outbreaks)  . [DISCONTINUED] azithromycin (ZITHROMAX Z-PAK) 250 MG tablet As directed  . [DISCONTINUED] budesonide-formoterol (SYMBICORT) 160-4.5 MCG/ACT inhaler Inhale 2 puffs into the lungs 2 (two) times daily.  . [DISCONTINUED] cetirizine (ZYRTEC) 10 MG tablet Take 10 mg by mouth daily.    . [DISCONTINUED] HYDROcodone-homatropine (HYDROMET) 5-1.5 MG/5ML syrup Take 5 mLs by mouth every 4 (four) hours as needed.  . [DISCONTINUED] promethazine (PHENERGAN) 25 MG tablet Take 25 mg by mouth as needed.     No  facility-administered encounter medications on file as of 12/02/2015.   Allergies  Allergen Reactions  . Byetta 10 Mcg Pen [Exenatide] Hives  . Penicillins   . Tramadol Hcl   . Avocado Diarrhea and Other (See Comments)    Severe cramping, sweats, and diarrhea in upper GI/stomach   Patient Active Problem List   Diagnosis Date Noted  . Plantar fasciitis 08/08/2013  . Obesity, Class III, BMI 40-49.9 (morbid obesity) ([place]) 04/09/2013  . Diabetes ([place]) 03/31/2013  . Unspecified sleep apnea 03/31/2013  . PCO (polycystic ovaries) 04/11/2012  . NECK PAIN 11/21/2010  . FATTY LIVER DISEASE 01/26/2010  . NAUSEA WITH VOMITING 01/26/2010  . ABDOMINAL PAIN, EPIGASTRIC 01/26/2010  . PORTAL HYPERTENSION 01/26/2010  . ABDOMINAL PAIN, RIGHT UPPER QUADRANT 12/27/2009  . MIGRAINE HEADACHE 06/28/2009  . ACUTE BRONCHITIS 06/08/2009  . ABDOMINAL PAIN, RIGHT LOWER QUADRANT 11/24/2008  . BREAST MASS 08/21/2008  . KNEE PAIN 04/14/2008  . HYPOTHYROIDISM 03/17/2008  . HYPERLIPIDEMIA 03/17/2008  . DEPRESSION 03/17/2008  . NEUROPATHY 03/17/2008  . HYPERTENSION 03/17/2008  . ASTHMA 03/17/2008  .  GERD 03/17/2008  . HEADACHE 03/17/2008   Social History   Social History  . Marital Status: Married    Spouse Name: N/A  . Number of Children: 5  . Years of Education: N/A   Occupational History  . Nurse, learning disability    Social History Main Topics  . Smoking status: Current Every Day Smoker -- 1.00 packs/day    Types: Cigarettes  . Smokeless tobacco: Never Used  . Alcohol Use: 0.0 oz/week    0 Standard drinks or equivalent per week     Comment: rare  . Drug Use: No  . Sexual Activity: Yes   Other Topics Concern  . Not on file   Social History Narrative    Ms. Proby's family history is not on file. She was adopted.      Objective:    Filed Vitals:   12/02/15 1357  BP: 76/50  Pulse: 96    Physical Exam  well-developed morbidly obese white female in no acute distress,  pleasant blood pressure 76/50 pulse 96 height 5 foot 1 weight 256, BMI 47. HEENT ;nontraumatic, cephalic EOMI PERRLA sclera anicteric, Cardiovascular ;regular rate and rhythm with S1-S2 no murmur or gallop, Pulmonary; clear bilaterally, Abdomen ;morbidly obese soft she has mild rather generalized tenderness more noticeable in the left mid and left upper  abdomen no guarding or rebound no palpable mass or hepatosplenomegaly, Bowel sounds are present, Rectal; exam not done, Ext; No Clubbing Cyanosis or Edema Skin Warm and Dry, Neuropsych; Mood and Affect Appropriate     Assessment & Plan:   #1 40 yo female with frequent post prandial diarrhea- symptoms for years worse past several months Most consistent with IBS- will r/o IBD,microscopic colitis, celiac, diabetic  visceral neuropathy #2 LUQ pain - may be IBS related, r/o gastritis /PUD #3 s/p GB #4 remote small bowel resection/intussussception as infant #5 IDDM #6 morbid obesity-BMI 93 #7 PCO #8HTN #9 asthma #10 psychiatric disorder  Plan;labs today, ESR,CRP,TTG/IGA Schedule for EGD and Colonoscopy with Dr Fuller Plan- procedures discussed in detail with patient and she is agreeable to proceed Continue omeprazole 20 mg by mouth daily Add Bentyl 10 mg by mouth one half hour before meals Add daily probiotic, align or culturelle 1 by mouth every morning     S  PA-C 12/02/2015   Cc: Laurey Morale, MD

## 2015-12-02 NOTE — Patient Instructions (Addendum)
Please go to the basement level to have your labs drawn.  You have been scheduled for an endoscopy and colonoscopy. Please follow the written instructions given to you at your visit today. We have given you a prep sample. If you use inhalers (even only as needed), please bring them with you on the day of your procedure. Your physician has requested that you go to www.[email or web address].com and enter the access code given to you at your visit today. This web site gives a general overview about your procedure. However, you should still follow specific instructions given to you by our office regarding your preparation for the procedure.  You may have a light breakfast the morning of prep day (the day before the procedure).  You may choose from: eggs, toast, chicken noodle soup, crackers.  You should have your breakfast completed between 8:00 and 9:00 am. Clear liquids only for the rest of the day on prep day and up until 3 hours before procedure.  You can get a probiotic at the pharmacy or [facility]. Suggestions are: Align. We have provided you with a coupon.

## 2015-12-02 NOTE — Telephone Encounter (Signed)
FYI

## 2015-12-03 LAB — TISSUE TRANSGLUTAMINASE, IGG: Tissue Transglut Ab: 1 U/mL (ref <6)

## 2015-12-03 NOTE — Progress Notes (Signed)
Reviewed and agree with management plan.  Mehtaab Mayeda T. Sly Parlee, MD FACG 

## 2015-12-13 ENCOUNTER — Telehealth: Payer: Self-pay | Admitting: Endocrinology

## 2015-12-13 NOTE — Telephone Encounter (Signed)
please call patient: Ov due 

## 2015-12-14 NOTE — Telephone Encounter (Signed)
Left a voicemail requesting a call back from the pt to schedule her appointment.

## 2015-12-20 ENCOUNTER — Ambulatory Visit: Payer: BLUE CROSS/BLUE SHIELD | Admitting: Psychology

## 2015-12-29 ENCOUNTER — Encounter: Payer: Self-pay | Admitting: Gastroenterology

## 2015-12-29 ENCOUNTER — Ambulatory Visit (AMBULATORY_SURGERY_CENTER): Payer: BLUE CROSS/BLUE SHIELD | Admitting: Gastroenterology

## 2015-12-29 VITALS — BP 119/74 | HR 72 | Temp 98.0°F | Resp 11 | Ht 61.0 in | Wt 256.0 lb

## 2015-12-29 DIAGNOSIS — R197 Diarrhea, unspecified: Secondary | ICD-10-CM | POA: Diagnosis present

## 2015-12-29 DIAGNOSIS — R1012 Left upper quadrant pain: Secondary | ICD-10-CM | POA: Diagnosis not present

## 2015-12-29 DIAGNOSIS — K3189 Other diseases of stomach and duodenum: Secondary | ICD-10-CM | POA: Diagnosis not present

## 2015-12-29 DIAGNOSIS — K2950 Unspecified chronic gastritis without bleeding: Secondary | ICD-10-CM | POA: Diagnosis not present

## 2015-12-29 DIAGNOSIS — D125 Benign neoplasm of sigmoid colon: Secondary | ICD-10-CM

## 2015-12-29 LAB — GLUCOSE, CAPILLARY
GLUCOSE-CAPILLARY: 135 mg/dL — AB (ref 65–99)
Glucose-Capillary: 170 mg/dL — ABNORMAL HIGH (ref 65–99)

## 2015-12-29 MED ORDER — OMEPRAZOLE 40 MG PO CPDR: 40.0000 mg | DELAYED_RELEASE_CAPSULE | Freq: Every day | ORAL | Status: DC

## 2015-12-29 MED ORDER — SODIUM CHLORIDE 0.9 % IV SOLN: 500.0000 mL | INTRAVENOUS | Status: DC

## 2015-12-29 NOTE — Patient Instructions (Signed)
Discharge instructions given. Handouts on polyps, hiatal hernia, esophagitis and a FODMAP diet. Resume previous medications. YOU HAD AN ENDOSCOPIC PROCEDURE TODAY AT [facility]:   Refer to the procedure report that was given to you for any specific questions about what was found during the examination.  If the procedure report does not answer your questions, please call your gastroenterologist to clarify.  If you requested that your care partner not be given the details of your procedure findings, then the procedure report has been included in a sealed envelope for you to review at your convenience later.  YOU SHOULD EXPECT: Some feelings of bloating in the abdomen. Passage of more gas than usual.  Walking can help get rid of the air that was put into your GI tract during the procedure and reduce the bloating. If you had a lower endoscopy (such as a colonoscopy or flexible sigmoidoscopy) you may notice spotting of blood in your stool or on the toilet paper. If you underwent a bowel prep for your procedure, you may not have a normal bowel movement for a few days.  Please Note:  You might notice some irritation and congestion in your nose or some drainage.  This is from the oxygen used during your procedure.  There is no need for concern and it should clear up in a day or so.  SYMPTOMS TO REPORT IMMEDIATELY:   Following lower endoscopy (colonoscopy or flexible sigmoidoscopy):  Excessive amounts of blood in the stool  Significant tenderness or worsening of abdominal pains  Swelling of the abdomen that is new, acute  Fever of 100F or higher   Following upper endoscopy (EGD)  Vomiting of blood or coffee ground material  New chest pain or pain under the shoulder blades  Painful or persistently difficult swallowing  New shortness of breath  Fever of 100F or higher  Black, tarry-looking stools  For urgent or emergent issues, a gastroenterologist can be reached at any hour by  calling [phone].   DIET: Your first meal following the procedure should be a small meal and then it is ok to progress to your normal diet. Heavy or fried foods are harder to digest and may make you feel nauseous or bloated.  Likewise, meals heavy in dairy and vegetables can increase bloating.  Drink plenty of fluids but you should avoid alcoholic beverages for 24 hours.  ACTIVITY:  You should plan to take it easy for the rest of today and you should NOT DRIVE or use heavy machinery until tomorrow (because of the sedation medicines used during the test).    FOLLOW UP: Our staff will call the number listed on your records the next business day following your procedure to check on you and address any questions or concerns that you may have regarding the information given to you following your procedure. If we do not reach you, we will leave a message.  However, if you are feeling well and you are not experiencing any problems, there is no need to return our call.  We will assume that you have returned to your regular daily activities without incident.  If any biopsies were taken you will be contacted by phone or by letter within the next 1-3 weeks.  Please call us at [phone] if you have not heard about the biopsies in 3 weeks.    SIGNATURES/CONFIDENTIALITY: You and/or your care partner have signed paperwork which will be entered into your electronic medical record.  These signatures attest  to the fact that that the information above on your After Visit Summary has been reviewed and is understood.  Full responsibility of the confidentiality of this discharge information lies with you and/or your care-partner. 

## 2015-12-29 NOTE — Op Note (Signed)
[facility]  [address] [place], [address]   ENDOSCOPY PROCEDURE REPORT  PATIENT: Miranda [name], Miranda [name]  MR#: [id number] BIRTHDATE: [birth year] , 39  yrs. old GENDER: female ENDOSCOPIST: Ladene Artist, MD, Marval Regal REFERRED BY:  Laurey Morale, M.D. PROCEDURE DATE:  12/29/2015 PROCEDURE:  EGD w/ biopsy ASA CLASS:     Class II INDICATIONS:  abdominal pain in upper left quadrant. MEDICATIONS: Monitored anesthesia care, Residual sedation present, and Propofol 150 mg IV TOPICAL ANESTHETIC: none  DESCRIPTION OF PROCEDURE: After the risks benefits and alternatives of the procedure were thoroughly explained, informed consent was obtained.  The LB [id number] endoscope was introduced through the mouth and advanced to the second portion of the duodenum , Without limitations.  The instrument was slowly withdrawn as the mucosa was fully examined.   ESOPHAGUS: There was LA Class B esophagitis (One or more mucosal breaks > 30mm, but without continuity across mucosal folds) noted. The esophagus otherwise appeared normal. STOMACH: A single 6 mm umbilicated center nodule was located in the gastric antrum.  Multiple biopsies were performed.   The stomach otherwise appeared normal. DUODENUM: The duodenal mucosa showed no abnormalities in the bulb and 2nd part of the duodenum.  Retroflexed views revealed a small hiatal hernia.  The scope was then withdrawn from the patient and the procedure completed.  COMPLICATIONS: There were no immediate complications.  ENDOSCOPIC IMPRESSION: 1.   LA Class B esophagitis 2.   Nodule in the gastric antrum; multiple biopsies performed 3.   Small hiatal hernia  RECOMMENDATIONS: 1.  Anti-reflux regimen long term 2.  Await pathology results 3.  PPI qam long term: omeprazole 40 mg po qam, 1 year of refills   eSigned:  Ladene Artist, MD, [place] 12/29/2015 3:12 PM

## 2015-12-29 NOTE — Progress Notes (Signed)
Called to room to assist during endoscopic procedure.  Patient ID and intended procedure confirmed with present staff. Received instructions for my participation in the procedure from the performing physician.  

## 2015-12-29 NOTE — Op Note (Signed)
[facility]  [address]. [place], [address]   COLONOSCOPY PROCEDURE REPORT  PATIENT: [name], Miranda  MR#: [id number] BIRTHDATE: 08/19/1976 , 39  yrs. old GENDER: female ENDOSCOPIST: Ladene Artist, MD, Marval Regal REFERRED BY:  Laurey Morale, M.D. PROCEDURE DATE:  12/29/2015 PROCEDURE:   Colonoscopy, diagnostic and Colonoscopy with biopsy First Screening Colonoscopy - Avg.  risk and is 50 yrs.  old or older - No.  Prior Negative Screening - Now for repeat screening. N/A  History of Adenoma - Now for follow-up colonoscopy & has been > or = to 3 yrs.  N/A  Polyps removed today? Yes ASA CLASS:   Class II INDICATIONS:Clinically significant diarrhea of unexplained origin.  MEDICATIONS: Monitored anesthesia care and Propofol 250 mg IV DESCRIPTION OF PROCEDURE:   After the risks benefits and alternatives of the procedure were thoroughly explained, informed consent was obtained.  The digital rectal exam revealed no abnormalities of the rectum.   The LB [id number]  endoscope was introduced through the anus and advanced to the terminal ileum which was intubated for a short distance. No adverse events experienced.   The quality of the prep was excellent.  (Suprep was used)  The instrument was then slowly withdrawn as the colon was fully examined. Estimated blood loss is zero unless otherwise noted in this procedure report.   COLON FINDINGS: A sessile polyp measuring 5 mm in size was found in the sigmoid colon.  A polypectomy was performed with cold forceps. The resection was complete, the polyp tissue was completely retrieved and sent to histology.   The colonic mucosa appeared normal at the splenic flexure, in the transverse colon, rectum, descending colon, at the ileocecal valve, cecum, hepatic flexure, and in the ascending colon.  Multiple random biopsies were performed.   The examined terminal ileum appeared to be normal. Retroflexed views revealed no  abnormalities. The time to cecum = 1.2 Withdrawal time = 10.5   The scope was withdrawn and the procedure completed. COMPLICATIONS: There were no immediate complications.  ENDOSCOPIC IMPRESSION: 1.   Sessile polyp in the sigmoid colon; polypectomy performed with cold forceps 2.   The colonic mucosa otherwise appeared normal; multiple random biopsies performed 3.   The examined terminal ileum appeared to be normal  RECOMMENDATIONS: 1.  Await pathology results 2.  Repeat colonoscopy in 5 years if polyp adenomatous; otherwise 10 years 3.  Continue Bentyl 10 mg tid ac 4.  Trial of low FODMAP diet  eSigned:  Ladene Artist, MD, [facility] 12/29/2015 3:06 PM

## 2015-12-29 NOTE — Progress Notes (Signed)
A/ox3 pleased with MAC, report to Celia RN 

## 2015-12-29 NOTE — Progress Notes (Signed)
Dental advisory given to patient 

## 2015-12-30 ENCOUNTER — Telehealth: Payer: Self-pay | Admitting: *Deleted

## 2015-12-30 NOTE — Telephone Encounter (Signed)
  Follow up Call-  Call back number 12/29/2015  Post procedure Call Back phone  # [phone]  Permission to leave phone message Yes     Patient questions:  Do you have a fever, pain , or abdominal swelling? Yes.  Migraine Pain Score  7 *  Have you tolerated food without any problems? No. Due to migraine  Have you been able to return to your normal activities? No. Migraine related  Do you have any questions about your discharge instructions: Diet   No. Medications  No. Follow up visit  No.  Do you have questions or concerns about your Care? No.  Actions: * If pain score is 4 or above: No action needed, pain <4. Pain is a migraine #7. Patient states that it was due to not eating. States that she will be fine once it goes away. States nothing related to procedure.

## 2016-01-04 ENCOUNTER — Encounter: Payer: Self-pay | Admitting: Gastroenterology

## 2016-01-10 ENCOUNTER — Ambulatory Visit: Payer: BLUE CROSS/BLUE SHIELD | Admitting: Psychology

## 2016-01-11 LAB — HM DIABETES EYE EXAM

## 2016-01-14 ENCOUNTER — Encounter: Payer: Self-pay | Admitting: Family Medicine

## 2016-01-14 ENCOUNTER — Telehealth: Payer: Self-pay | Admitting: Family Medicine

## 2016-01-14 ENCOUNTER — Ambulatory Visit (INDEPENDENT_AMBULATORY_CARE_PROVIDER_SITE_OTHER): Payer: BLUE CROSS/BLUE SHIELD | Admitting: Family Medicine

## 2016-01-14 VITALS — HR 117 | Temp 98.7°F | Ht 61.0 in | Wt 268.0 lb

## 2016-01-14 DIAGNOSIS — G43701 Chronic migraine without aura, not intractable, with status migrainosus: Secondary | ICD-10-CM

## 2016-01-14 MED ORDER — KETOROLAC TROMETHAMINE 60 MG/2ML IM SOLN
60.0000 mg | Freq: Once | INTRAMUSCULAR | Status: AC
  Administered 2016-01-14: 60 mg via INTRAMUSCULAR

## 2016-01-14 MED ORDER — TOPIRAMATE 100 MG PO TABS: 100.0000 mg | ORAL_TABLET | Freq: Every day | ORAL | Status: DC

## 2016-01-14 MED ORDER — TOPIRAMATE 100 MG PO TABS: 100.0000 mg | ORAL_TABLET | Freq: Two times a day (BID) | ORAL | Status: DC

## 2016-01-14 NOTE — Addendum Note (Signed)
Addended by: Aggie Hacker A on: 01/14/2016 04:28 PM   Modules accepted: Orders

## 2016-01-14 NOTE — Telephone Encounter (Signed)
Per Dr. Sarajane Jews resend script for Topamax 100 mg take 1 po bid. I did resend script e-scribe and spoke with pt.

## 2016-01-14 NOTE — Progress Notes (Signed)
   Subjective:    Patient ID: Miranda [name], female    DOB: [birth year], 40 y.o.   MRN: [id number]  HPI Here for worsening migraines in the past month. For the past 3 weeks she has had daily migraines, some with vomiting and some without. No recent medication changes. No precipitating factors that she can idenitfy. Sometimes Maxalt helps and sometimes not.    Review of Systems  Constitutional: Negative.   Eyes: Negative.   Respiratory: Negative.   Cardiovascular: Negative.   Neurological: Positive for headaches.       Objective:   Physical Exam  Constitutional: She is oriented to person, place, and time. She appears well-developed and well-nourished.  In pain, photophobic   HENT:  Head: Normocephalic and atraumatic.  Eyes: Conjunctivae and EOM are normal. Pupils are equal, round, and reactive to light.  Neck: No thyromegaly present.  Cardiovascular: Normal rate, regular rhythm, normal heart sounds and intact distal pulses.   Pulmonary/Chest: Effort normal and breath sounds normal.  Lymphadenopathy:    She has no cervical adenopathy.  Neurological: She is alert and oriented to person, place, and time. She has normal reflexes. No cranial nerve deficit. She exhibits normal muscle tone. Coordination normal.          Assessment & Plan:  Migraines. Given a Toradol shot today. For prevention we will increase her Topamax to 100 mg daily.

## 2016-01-14 NOTE — Telephone Encounter (Signed)
Pt states Dr Sarajane Jews was going to increase her topiramate (TOPAMAX) 100 MG tablet.  Pt states she thought it was 50 mg, but she states she is already taking 100 mg. So she is not sure what Dr Sarajane Jews will want to do now.  Pt is on her way to  [facility]/ [address]

## 2016-01-14 NOTE — Progress Notes (Signed)
Pre visit review using our clinic review tool, if applicable. No additional management support is needed unless otherwise documented below in the visit note. 

## 2016-01-27 ENCOUNTER — Other Ambulatory Visit: Payer: Self-pay | Admitting: Family Medicine

## 2016-01-28 ENCOUNTER — Ambulatory Visit (INDEPENDENT_AMBULATORY_CARE_PROVIDER_SITE_OTHER): Payer: BLUE CROSS/BLUE SHIELD | Admitting: Family Medicine

## 2016-01-28 ENCOUNTER — Encounter: Payer: Self-pay | Admitting: Family Medicine

## 2016-01-28 VITALS — BP 98/50 | HR 106 | Temp 98.8°F | Ht 61.0 in | Wt 261.0 lb

## 2016-01-28 DIAGNOSIS — J069 Acute upper respiratory infection, unspecified: Secondary | ICD-10-CM

## 2016-01-28 MED ORDER — METHYLPREDNISOLONE ACETATE 80 MG/ML IJ SUSP
120.0000 mg | Freq: Once | INTRAMUSCULAR | Status: AC
  Administered 2016-01-28: 120 mg via INTRAMUSCULAR

## 2016-01-28 NOTE — Progress Notes (Signed)
   Subjective:    Patient ID: Miranda [name], female    DOB: [birth year], 40 y.o.   MRN: [id number]  HPI Here for the onset 3 days ago of fever to 101 degrees, aches, and a ST. No cough. Drinking fluids.    Review of Systems  Constitutional: Positive for fever.  HENT: Positive for sore throat. Negative for congestion, postnasal drip and sinus pressure.   Eyes: Negative.   Respiratory: Negative.        Objective:   Physical Exam  Constitutional: She appears well-developed and well-nourished.  HENT:  Right Ear: External ear normal.  Left Ear: External ear normal.  Nose: Nose normal.  Mouth/Throat: Oropharynx is clear and moist. No oropharyngeal exudate.  Eyes: Conjunctivae are normal.  Neck: Neck supple. No thyromegaly present.  Pulmonary/Chest: Effort normal and breath sounds normal.  Lymphadenopathy:    She has no cervical adenopathy.          Assessment & Plan:  Viral URI. Given a steroid shot.  Laurey Morale, MD

## 2016-01-28 NOTE — Addendum Note (Signed)
Addended by: Aggie Hacker A on: 01/28/2016 02:08 PM   Modules accepted: Orders

## 2016-01-31 ENCOUNTER — Ambulatory Visit (INDEPENDENT_AMBULATORY_CARE_PROVIDER_SITE_OTHER): Payer: BLUE CROSS/BLUE SHIELD | Admitting: Psychology

## 2016-01-31 DIAGNOSIS — F4310 Post-traumatic stress disorder, unspecified: Secondary | ICD-10-CM

## 2016-02-03 ENCOUNTER — Ambulatory Visit (INDEPENDENT_AMBULATORY_CARE_PROVIDER_SITE_OTHER): Payer: BLUE CROSS/BLUE SHIELD | Admitting: Psychology

## 2016-02-03 DIAGNOSIS — F4310 Post-traumatic stress disorder, unspecified: Secondary | ICD-10-CM

## 2016-02-09 ENCOUNTER — Ambulatory Visit (INDEPENDENT_AMBULATORY_CARE_PROVIDER_SITE_OTHER): Payer: BLUE CROSS/BLUE SHIELD | Admitting: Psychology

## 2016-02-09 ENCOUNTER — Other Ambulatory Visit: Payer: Self-pay | Admitting: Endocrinology

## 2016-02-09 DIAGNOSIS — F4310 Post-traumatic stress disorder, unspecified: Secondary | ICD-10-CM | POA: Diagnosis not present

## 2016-02-16 ENCOUNTER — Ambulatory Visit (INDEPENDENT_AMBULATORY_CARE_PROVIDER_SITE_OTHER): Payer: BLUE CROSS/BLUE SHIELD | Admitting: Family Medicine

## 2016-02-16 ENCOUNTER — Encounter: Payer: Self-pay | Admitting: Family Medicine

## 2016-02-16 VITALS — BP 116/92 | HR 83 | Temp 98.7°F | Ht 61.0 in | Wt 254.0 lb

## 2016-02-16 DIAGNOSIS — T50905A Adverse effect of unspecified drugs, medicaments and biological substances, initial encounter: Secondary | ICD-10-CM

## 2016-02-16 NOTE — Progress Notes (Signed)
Pre visit review using our clinic review tool, if applicable. No additional management support is needed unless otherwise documented below in the visit note. 

## 2016-02-16 NOTE — Progress Notes (Signed)
   Subjective:    Patient ID: Miranda [name], female    DOB: Mar 14, 1976, 40 y.o.   MRN: [id number]  HPI Here for advice about probable medication side effects. She saw Dr. Melissa Montane recently for a sinus infection and was prescribed Clindamycin. After taking this for 5 days she had to stop it due to nausea, vomiting, and diarrhea. When she called the ENT office back about this, she was told to see [place]. She has been taking Zofran and Lomotil, and the nausea and diarrhea have greatly improved. She feels very weak and a bit lightheaded now. No fever. Her appetite is minimal but she is drinking fluids.    Review of Systems  Constitutional: Positive for fatigue. Negative for fever.  HENT: Negative.   Eyes: Negative.   Respiratory: Negative.   Cardiovascular: Negative.   Gastrointestinal: Positive for nausea, vomiting and diarrhea. Negative for abdominal pain, constipation and abdominal distention.  Neurological: Positive for light-headedness. Negative for dizziness and headaches.       Objective:   Physical Exam  Constitutional: She is oriented to person, place, and time.  She appears ill, alert.   Eyes: Conjunctivae are normal.  Neck: Neck supple. No thyromegaly present.  Cardiovascular: Normal rate, regular rhythm, normal heart sounds and intact distal pulses.   Pulmonary/Chest: Effort normal and breath sounds normal.  Abdominal: Soft. Bowel sounds are normal. She exhibits no distension and no mass. There is no tenderness. There is no rebound and no guarding.  Lymphadenopathy:    She has no cervical adenopathy.  Neurological: She is alert and oriented to person, place, and time.          Assessment & Plan:  She has had GI side effects of the Clindamycin. She already has medications to help the nausea and diarrhea. I think it will be a matter of time for her to get her strength back. She will rest and drink plenty of fluids. Recheck prn  Laurey Morale, MD

## 2016-02-21 ENCOUNTER — Ambulatory Visit (INDEPENDENT_AMBULATORY_CARE_PROVIDER_SITE_OTHER): Payer: BLUE CROSS/BLUE SHIELD | Admitting: Psychology

## 2016-02-21 DIAGNOSIS — F4310 Post-traumatic stress disorder, unspecified: Secondary | ICD-10-CM | POA: Diagnosis not present

## 2016-03-13 ENCOUNTER — Ambulatory Visit: Payer: BLUE CROSS/BLUE SHIELD | Admitting: Psychology

## 2016-03-22 ENCOUNTER — Encounter: Payer: Self-pay | Admitting: Family Medicine

## 2016-03-22 ENCOUNTER — Ambulatory Visit (INDEPENDENT_AMBULATORY_CARE_PROVIDER_SITE_OTHER): Payer: BLUE CROSS/BLUE SHIELD | Admitting: Family Medicine

## 2016-03-22 VITALS — BP 98/68 | HR 108 | Temp 98.8°F | Ht 61.0 in | Wt 264.0 lb

## 2016-03-22 DIAGNOSIS — H60392 Other infective otitis externa, left ear: Secondary | ICD-10-CM

## 2016-03-22 MED ORDER — NEOMYCIN-POLYMYXIN-HC 3.5-10000-1 OT SUSP: 4.0000 [drp] | Freq: Four times a day (QID) | OTIC | Status: DC

## 2016-03-22 MED ORDER — CIPROFLOXACIN-DEXAMETHASONE 0.3-0.1 % OT SUSP: 4.0000 [drp] | Freq: Two times a day (BID) | OTIC | Status: DC

## 2016-03-22 NOTE — Progress Notes (Signed)
Pre visit review using our clinic review tool, if applicable. No additional management support is needed unless otherwise documented below in the visit note. 

## 2016-03-22 NOTE — Progress Notes (Signed)
   Subjective:    Patient ID: Miranda [name], female    DOB: [birth year], 40 y.o.   MRN: [id number]  HPI Here for 3 days of pain and decreased hearing in the left ear. No sinus pressure or fever.    Review of Systems  Constitutional: Negative.   HENT: Positive for congestion, ear pain and hearing loss. Negative for ear discharge, postnasal drip, sinus pressure, sore throat and tinnitus.   Eyes: Negative.   Respiratory: Negative.        Objective:   Physical Exam  Constitutional: She appears well-developed and well-nourished.  HENT:  Right Ear: External ear normal.  Nose: Nose normal.  Mouth/Throat: Oropharynx is clear and moist.  Left ear canal is red and swollen and is about 1/3 occluded with cerumen. The TM is clear.   Eyes: Conjunctivae are normal.  Neck: Neck supple. No thyromegaly present.  Pulmonary/Chest: Effort normal and breath sounds normal.  Lymphadenopathy:    She has no cervical adenopathy.          Assessment & Plan:  Otitis externa. Treat with Ciprodex drops.  Laurey Morale, MD

## 2016-03-22 NOTE — Addendum Note (Signed)
Addended by: Alysia Penna A on: 03/22/2016 04:43 PM   Modules accepted: Orders, Medications

## 2016-03-25 DIAGNOSIS — Z8673 Personal history of transient ischemic attack (TIA), and cerebral infarction without residual deficits: Secondary | ICD-10-CM

## 2016-03-25 DIAGNOSIS — I639 Cerebral infarction, unspecified: Secondary | ICD-10-CM

## 2016-03-25 HISTORY — DX: Personal history of transient ischemic attack (TIA), and cerebral infarction without residual deficits: Z86.73

## 2016-03-25 HISTORY — DX: Cerebral infarction, unspecified: I63.9

## 2016-03-28 ENCOUNTER — Telehealth: Payer: Self-pay | Admitting: Family Medicine

## 2016-03-28 ENCOUNTER — Ambulatory Visit: Payer: BLUE CROSS/BLUE SHIELD | Admitting: Psychology

## 2016-03-28 NOTE — Telephone Encounter (Signed)
Spoke to the pt.  She stated she was seen at [facility] over the weekend and was dx with a stroke.  Scheduled her a hospital follow up with Dr. Sarajane Jews on 03/29/16.  Dr. Sarajane Jews notified.

## 2016-03-28 NOTE — Telephone Encounter (Signed)
[facility] Night - Client [place] Patient Name: Miranda [name] Gender: Female DOB: [birth year] Age: 40 Y 11 M 19 D Return Phone Number: [phone] (Secondary) Address: City/State/Zip: [place] [address] Client [facility] Night - Client Client Site [facility] - Night Physician Alysia Penna - MD Contact Type Call Who Is Calling Patient / Member / Family / Caregiver Call Type Triage / Clinical Caller Name Sheliah Plane Relationship To Patient Friend Return Phone Number [phone] (Secondary) Chief Complaint FAINTING or [place] Reason for Call Symptomatic / Request for [place] states, room mate has been acting drunk for 20 hrs, having trouble staying awake. She has not had any alcohol. she had 2 doses of Topamax recently. She is diabetic 197 blood sugar, BP 112/81 Hr 111. PreDisposition InappropriateToAsk Translation No Nurse Assessment Nurse: Genoveva Ill, RN, Lattie Haw Date/Time (Eastern Time): 03/25/2016 1:34:33 PM Confirm and document reason for call. If symptomatic, describe symptoms. You must click the next button to save text entered. ---Caller states room mate has been acting drunk for 20 hrs, having trouble staying awake, slurring speech, giddy and off balance; has not had any alcohol. Has the patient traveled out of the country within the last 30 days? ---Not Applicable Does the patient have any new or worsening symptoms? ---Yes Will a triage be completed? ---Yes Related visit to physician within the last 2 weeks? ---No Does the PT have any chronic conditions? (i.e. diabetes, asthma, etc.) ---Yes List chronic conditions. ---PCOS, diabetes Is the patient pregnant or possibly pregnant? (Ask all females between the ages of 72-55) ---No Is this a behavioral health or substance abuse call? ---No Guidelines Guideline Title  Affirmed Question Affirmed Notes Nurse Date/Time (Eastern Time) Confusion - Delirium [1] Difficult to awaken or acting confused (disoriented, slurred Burress, RN, Lattie Haw 03/25/2016 1:36:55 PM PLEASE NOTE: All timestamps contained within this report are represented as [place] Standard Time. CONFIDENTIALTY NOTICE: This fax transmission is intended only for the addressee. It contains information that is legally privileged, confidential or otherwise protected from use or disclosure. If you are not the intended recipient, you are strictly prohibited from reviewing, disclosing, copying using or disseminating any of this information or taking any action in reliance on or regarding this information. If you have received this fax in error, please notify us immediately by telephone so that we can arrange for its return to [place]. Phone: [phone], Toll-Free: [phone], Fax: [phone] Page: 2 of 2 Call Id: [id number] Guidelines Guideline Title Affirmed Question Affirmed Notes Nurse Date/Time Eilene Ghazi Time) speech) AND [2] present now AND [3] diabetic Disp. Time Eilene Ghazi Time) Disposition Final User 03/25/2016 1:32:38 PM Send to Urgent Queue Eather Colas 03/25/2016 1:41:19 PM 911 Outcome Documentation Burress, RN, Lattie Haw Reason: caller declined calling 911and states will take pt to ED; recommendation reinforced 03/25/2016 1:40:22 PM Call EMS 911 Now Yes Burress, RN, Leland Johns Understands: Yes Disagree/Comply: Disagree Disagree/Comply Reason: Disagree with instructions Care Advice Given Per Guideline CALL EMS 911 NOW: Immediate medical attention is needed. You need to hang up and call 911 (or an ambulance). ([place] Discretion: I'll call you back in a few minutes to be sure you were able to reach them.) CARE ADVICE given per Confusion- Delirium (Adult) guideline. Referrals [facility] [place] - ED

## 2016-03-29 ENCOUNTER — Ambulatory Visit (INDEPENDENT_AMBULATORY_CARE_PROVIDER_SITE_OTHER): Payer: BLUE CROSS/BLUE SHIELD | Admitting: Family Medicine

## 2016-03-29 ENCOUNTER — Encounter: Payer: Self-pay | Admitting: Family Medicine

## 2016-03-29 ENCOUNTER — Telehealth: Payer: Self-pay | Admitting: Family Medicine

## 2016-03-29 VITALS — BP 110/78 | HR 116 | Temp 98.4°F | Ht 61.0 in | Wt 259.0 lb

## 2016-03-29 DIAGNOSIS — I63512 Cerebral infarction due to unspecified occlusion or stenosis of left middle cerebral artery: Secondary | ICD-10-CM | POA: Diagnosis not present

## 2016-03-29 DIAGNOSIS — I10 Essential (primary) hypertension: Secondary | ICD-10-CM | POA: Diagnosis not present

## 2016-03-29 DIAGNOSIS — E1042 Type 1 diabetes mellitus with diabetic polyneuropathy: Secondary | ICD-10-CM | POA: Diagnosis not present

## 2016-03-29 DIAGNOSIS — E785 Hyperlipidemia, unspecified: Secondary | ICD-10-CM

## 2016-03-29 DIAGNOSIS — H6122 Impacted cerumen, left ear: Secondary | ICD-10-CM

## 2016-03-29 NOTE — Telephone Encounter (Signed)
Pt does not want to take lipitor. Pt would like crestor instead.  [facility]

## 2016-03-29 NOTE — Progress Notes (Signed)
Pre visit review using our clinic review tool, if applicable. No additional management support is needed unless otherwise documented below in the visit note. 

## 2016-03-29 NOTE — Progress Notes (Signed)
   Subjective:    Patient ID: Miranda [name], female    DOB: [birth year], 40 y.o.   MRN: [id number]  HPI Here to follow up a hospital stay from 03-25-16 to 03-27-16 at [facility] for a left middle cerebral artery stoke. She presented with weakness in the right arm and leg, confusion, and expressive speech deficits. Scans revealed small infarcts in the left putamen and thalamus. Too much time had elapsed for her to be a candidate for TPA. She was stabilized and fortunately much of her neurologic function promptly returned. She was put on aspirin 81 mg and Lipitor 40 mg daily, and her other meds were continued. Her BP remained well controlled. Her A1c at that time was mildly high at 8.0. She was referred for ST, OT, and PT as an outpatient and she will begin these later today. She walks with a walker. She feels fine except for some residual right sided weakness and some speech difficulties.  She also mentions not being able to hear well out of the left ear. No ear pain.   Review of Systems  HENT: Positive for hearing loss. Negative for ear pain and trouble swallowing.   Eyes: Negative.   Respiratory: Negative.   Cardiovascular: Negative.   Neurological: Positive for speech difficulty and weakness. Negative for dizziness, tremors, seizures, syncope, facial asymmetry, light-headedness, numbness and headaches.       Objective:   Physical Exam  Constitutional: She is oriented to person, place, and time. She appears well-developed and well-nourished.  HENT:  Right Ear: External ear normal.  Nose: Nose normal.  Mouth/Throat: Oropharynx is clear and moist.  Left ear canal is full of cerumen   Eyes: Conjunctivae and EOM are normal. Pupils are equal, round, and reactive to light.  Neck: Neck supple. No thyromegaly present.  Cardiovascular: Normal rate, regular rhythm, normal heart sounds and intact distal pulses.   Pulmonary/Chest: Effort normal and breath sounds normal.  Musculoskeletal:  She exhibits no edema.  Lymphadenopathy:    She has no cervical adenopathy.  Neurological: She is alert and oriented to person, place, and time. No cranial nerve deficit. She exhibits abnormal muscle tone. Coordination abnormal.  She has expressive aphasia and has trouble finding her words           Assessment & Plan:  Recent left MCA stroke. She is already set up for ST, OT, and PT. We will refer for her to follow up with Neurology with Dr. Adrian Prows and with Cardiology with Dr. Bland Span soon, both in [facility]. Refer to ENT for the cerumen impaction.  Laurey Morale, MD

## 2016-03-30 MED ORDER — ROSUVASTATIN CALCIUM 20 MG PO TABS: 20.0000 mg | ORAL_TABLET | Freq: Every day | ORAL | Status: DC

## 2016-03-30 NOTE — Telephone Encounter (Signed)
Stop the Lipitor and call in Crestor 20 mg daily, one year supply

## 2016-03-30 NOTE — Telephone Encounter (Signed)
I sent script e-scribe, updated medication list and spoke with pt.

## 2016-04-03 ENCOUNTER — Telehealth: Payer: Self-pay | Admitting: Family Medicine

## 2016-04-03 NOTE — Telephone Encounter (Signed)
Per Dr Sarajane Jews, verbal approval for UA and culture.  Notified Denyse Amass.

## 2016-04-03 NOTE — Telephone Encounter (Signed)
Denyse Amass with [facility] states he saw pt and she is having symptoms of UTI. Pt has pain with urination, frequency, urgency. Denyse Amass would like to know if he can get a verbal to get urine sample when he goes tomorrow and he will take to the lab.

## 2016-04-05 ENCOUNTER — Telehealth: Payer: Self-pay | Admitting: Family Medicine

## 2016-04-05 NOTE — Telephone Encounter (Signed)
Have you received any results on this pt?

## 2016-04-05 NOTE — Telephone Encounter (Signed)
Pt call to ask if her UA results has come back

## 2016-04-05 NOTE — Telephone Encounter (Signed)
From what I can see in the chart, no urine culture was ever sent

## 2016-04-06 ENCOUNTER — Ambulatory Visit: Payer: BLUE CROSS/BLUE SHIELD | Admitting: Psychology

## 2016-04-06 ENCOUNTER — Telehealth: Payer: Self-pay

## 2016-04-06 ENCOUNTER — Telehealth: Payer: Self-pay | Admitting: *Deleted

## 2016-04-06 ENCOUNTER — Ambulatory Visit (INDEPENDENT_AMBULATORY_CARE_PROVIDER_SITE_OTHER): Payer: BLUE CROSS/BLUE SHIELD | Admitting: Psychology

## 2016-04-06 DIAGNOSIS — F4310 Post-traumatic stress disorder, unspecified: Secondary | ICD-10-CM | POA: Diagnosis not present

## 2016-04-06 MED ORDER — CIPROFLOXACIN HCL 500 MG PO TABS: 500.0000 mg | ORAL_TABLET | Freq: Two times a day (BID) | ORAL | Status: DC

## 2016-04-06 NOTE — Telephone Encounter (Signed)
Prior authorization request received from [facility] for Ciprodex.  Pt reported not taking on 03/29/16, discarding PA request

## 2016-04-06 NOTE — Telephone Encounter (Signed)
Left detailed message.   

## 2016-04-06 NOTE — Addendum Note (Signed)
Addended by: Miles Costain T on: 04/06/2016 02:47 PM   Modules accepted: Orders

## 2016-04-06 NOTE — Telephone Encounter (Signed)
[facility] RN called in to report urine culture that was ordered verbally b/c patient was complaining of burning w/ urination resulted positive for E. Coli. RN states result report should be faxed to office but wanted to contact our office and provide results verbally b/c patient is very uncomfortable and in need of antibiotic Rx. Advised would fwd message to Dr. Sarajane Jews.

## 2016-04-06 NOTE — Telephone Encounter (Signed)
Call in Cipro 500 mg bid for 7 days  

## 2016-04-06 NOTE — Telephone Encounter (Signed)
Pt notified to pick up at the pharmacy. 

## 2016-04-07 ENCOUNTER — Encounter: Payer: Self-pay | Admitting: Family Medicine

## 2016-04-11 ENCOUNTER — Telehealth: Payer: Self-pay | Admitting: Family Medicine

## 2016-04-11 ENCOUNTER — Ambulatory Visit (INDEPENDENT_AMBULATORY_CARE_PROVIDER_SITE_OTHER): Payer: BLUE CROSS/BLUE SHIELD | Admitting: Psychology

## 2016-04-11 DIAGNOSIS — F4310 Post-traumatic stress disorder, unspecified: Secondary | ICD-10-CM

## 2016-04-11 NOTE — Telephone Encounter (Signed)
Miranda [name] with [facility] is discharging pt from skilled nursing today. Pt will still be receiving some therapy from other areas

## 2016-04-12 NOTE — Telephone Encounter (Signed)
Will route to Dr. Fry as FYI.  

## 2016-04-18 ENCOUNTER — Ambulatory Visit (INDEPENDENT_AMBULATORY_CARE_PROVIDER_SITE_OTHER): Payer: BLUE CROSS/BLUE SHIELD | Admitting: Psychology

## 2016-04-18 DIAGNOSIS — F4310 Post-traumatic stress disorder, unspecified: Secondary | ICD-10-CM | POA: Diagnosis not present

## 2016-04-19 ENCOUNTER — Encounter: Payer: Self-pay | Admitting: Family Medicine

## 2016-04-21 ENCOUNTER — Ambulatory Visit: Payer: BLUE CROSS/BLUE SHIELD | Admitting: Psychology

## 2016-04-24 ENCOUNTER — Ambulatory Visit: Payer: BLUE CROSS/BLUE SHIELD | Admitting: Psychology

## 2016-04-25 ENCOUNTER — Ambulatory Visit: Payer: BLUE CROSS/BLUE SHIELD | Admitting: Psychology

## 2016-04-27 ENCOUNTER — Ambulatory Visit: Payer: BLUE CROSS/BLUE SHIELD | Admitting: Psychology

## 2016-05-04 ENCOUNTER — Ambulatory Visit: Payer: BLUE CROSS/BLUE SHIELD | Admitting: Psychology

## 2016-05-05 ENCOUNTER — Ambulatory Visit (INDEPENDENT_AMBULATORY_CARE_PROVIDER_SITE_OTHER): Payer: BLUE CROSS/BLUE SHIELD | Admitting: Family Medicine

## 2016-05-05 ENCOUNTER — Telehealth: Payer: Self-pay | Admitting: Family Medicine

## 2016-05-05 ENCOUNTER — Encounter: Payer: Self-pay | Admitting: Family Medicine

## 2016-05-05 DIAGNOSIS — R51 Headache: Secondary | ICD-10-CM | POA: Diagnosis not present

## 2016-05-05 DIAGNOSIS — E038 Other specified hypothyroidism: Secondary | ICD-10-CM | POA: Diagnosis not present

## 2016-05-05 DIAGNOSIS — R519 Headache, unspecified: Secondary | ICD-10-CM

## 2016-05-05 LAB — TSH: TSH: 4.68 u[IU]/mL — ABNORMAL HIGH (ref 0.35–4.50)

## 2016-05-05 LAB — POCT CBG MONITORING

## 2016-05-05 MED ORDER — KETOROLAC TROMETHAMINE 60 MG/2ML IM SOLN
60.0000 mg | Freq: Once | INTRAMUSCULAR | Status: AC
  Administered 2016-05-05: 60 mg via INTRAMUSCULAR

## 2016-05-05 NOTE — Progress Notes (Signed)
Pre visit review using our clinic review tool, if applicable. No additional management support is needed unless otherwise documented below in the visit note. 

## 2016-05-05 NOTE — Patient Instructions (Signed)
A few things to remember from today's visit:  [name] I have seen you today for an acute visit because your primary care provider was not available. Monitor for signs of worsening symptoms and seek immediate medical attention if any concerning/warning symptom as we discussed.     Headache, unspecified headache type - Plan: CT Head Wo Contrast  Other specified hypothyroidism - Plan: TSH   Headache could be related with mild, because you are reporting a "atypical" headache associated with right eyelid weakness a head CT is been arranged. Today he received a dose of Toradol 60 mg. I am checking thyroid function because you have not had one in a while and it wasn't done during your recent hospitalization.  Caution with some other medications, some can increase the risk of interaction.  Please be sure to follow with your doctor in 1-2 weeks, before if needed. If headache becomes severe, focal weakness, fever, visual changes, or other worrisome sign please seek immediate medical attention.    Please be sure medication list is accurate. If a new problem present, please set up appointment sooner than planned today.

## 2016-05-05 NOTE — Progress Notes (Signed)
HPI:  ACUTE VISIT:  Chief Complaint  Patient presents with  . Headache    Pt had a stroke in May. Pt had a sharp headache yesterday, still currently has it, located around her right eye area. Pt did have a left ear infection & wonders if it traveled to the right side now.    Ms.Brinda SHAMS URENA is a 40 y.o. female, who is here today with her roommate complaining of severe "atypical" headache that started last night at 7 pm. Hx of DM II, thyroid disease, neuropathic pain, and depression among some. She denies any history of renal disease.   She has history of migraine headaches, she states that her migraines have been better controlled since she started Topamax. She also mentions that she cannot keep right eye open, this is new. She denies visual changes or eye pain.  Hx of allergic rhinitis, has not noted nasal congestion or rhinorrhea.  Headache is localized above right eye, she describes it as throbbing, "ice freeze" sensation, constant, 8/10 in intensity. She took ibuprofen 800 mg today at around 8 AM, did not help.  She denies any new focal deficit or weakness. No associated involuntary extremities movement or syncope.  + Nausea, no vomiting. + Photophobia but feels like it is just right eye.  She is concerned about this headache, she doesn't feel like it is a migraine.  She was recently admitted to the hospital, [facility], because CVA 02/2016.  She has had head imaging in the past due to headaches.  Head CT with contrast 02/2016: Hypodensity left with putamen and caudate compatible with acute infarct. No hemorrhage. Ventricle size normal. No midline shift. Calvarium and skull base: Negative Paranasal sinuses: Large mucous retention cyst left maxillary sinus. Remaining sinuses clear. Orbits: Negative for orbital mass or edema. Bilateral exophthalmos  Brain MRI 02/2016: Focal area of restricted motion and T2 altered signal intensity superior posterior left  lenticular nucleus/ posterior left caudate region suspicious for acute/ subacute infarct in this insulin-dependent diabetic patient with acute onset of symptoms.  Minimal nonspecific white matter changes right peri atrial region and left frontal lobe. No abnormal intracranial enhancement. Exophthalmos.   04/2011 Head CT:  1.  Scalp injury at the vertex. 2.  No acute intracranial or calvarial findings. 3.  Bilateral maxillary sinus disease with possible air fluid levels.   Head CT 06/2001 due to headaches: EVIDENCE OF CHRONIC SINUSITIS WITH BILATERAL MAXILLARY SINUS POLYPS OR MUCUS RETENTION CYSTS.  Head CT in 01/2000: NO EVIDENCE OF INTRACRANIAL MASS OR HEMORRHAGE.  NO SKULL FRACTURE OR FOREIGN BODY.  EVIDENCE OF PROBABLE CHRONIC AND ACUTE MAXILLARY SINUSITIS.  Cervical MRI 02/2008: Normal examination.       Lab Results  Component Value Date   CREATININE 0.68 03/12/2015   BUN 9 03/12/2015   NA 138 03/12/2015   K 4.0 03/12/2015   CL 106 03/12/2015   CO2 24 03/12/2015   Lab Results  Component Value Date   TSH 4.52 01/23/2014               HgA1C 02/2016 elevated at 8.  03/26/16 at [facility] care elevated transaminases AST 67 and ALT 77, e GFR > 60.   Review of Systems  Constitutional: Negative for fever, appetite change, fatigue and unexpected weight change.  HENT: Negative for facial swelling, mouth sores, nosebleeds and trouble swallowing.   Eyes: Positive for photophobia. Negative for pain, redness and visual disturbance.  Respiratory: Negative for cough, shortness of  breath and wheezing.   Cardiovascular: Negative for chest pain, palpitations and leg swelling.  Gastrointestinal: Positive for nausea. Negative for vomiting and abdominal pain.       Negative for changes in bowel habits.  Genitourinary: Negative for dysuria, hematuria, decreased urine volume and difficulty urinating.  Skin: Negative for pallor and rash.  Neurological: Positive for dizziness and  headaches. Negative for seizures, syncope, weakness and numbness.  Psychiatric/Behavioral: Negative for hallucinations and confusion. The patient is nervous/anxious.       Current Outpatient Prescriptions on File Prior to Visit  Medication Sig Dispense Refill  . albuterol (PROAIR HFA) 108 (90 BASE) MCG/ACT inhaler Inhale 2 puffs into the lungs every 4 (four) hours as needed for wheezing. 8.5 g 11  . aspirin 81 MG tablet Take 81 mg by mouth daily.    Marland Kitchen buPROPion (WELLBUTRIN XL) 300 MG 24 hr tablet Take 300 mg by mouth daily.    . cyclobenzaprine (FLEXERIL) 10 MG tablet Take 1 tablet (10 mg total) by mouth 3 (three) times daily as needed for muscle spasms. 90 tablet 3  . cycloSPORINE (RESTASIS) 0.05 % ophthalmic emulsion 1 drop 2 (two) times daily. Reported on 12/29/2015    . dicyclomine (BENTYL) 10 MG capsule Take 1 capsule (10 mg total) by mouth 3 (three) times daily before meals. (Patient not taking: Reported on 03/29/2016) 90 capsule 1  . diphenoxylate-atropine (LOMOTIL) 2.5-0.025 MG tablet Take 2 tablets by mouth 3 (three) times daily as needed for diarrhea or loose stools. (Patient not taking: Reported on 03/29/2016) 100 tablet 0  . Fexofenadine HCl (ALLEGRA PO) Take 1 tablet by mouth daily.    . Glucos-Chondroit-Hyaluron-MSM (GLUCOSAMINE CHONDROITIN JOINT PO) Take 2 tablets by mouth daily.    Marland Kitchen glucose blood (BAYER CONTOUR NEXT TEST) test strip 1 each by Other route 4 (four) times daily. And lancets 4/day 120 each 12  . hydrOXYzine (VISTARIL) 50 MG capsule Take 1 tablet by mouth daily as needed.     . hyoscyamine (LEVSIN) 0.125 MG/5ML ELIX Take 0.125 mg by mouth as needed. Reported on 03/29/2016    . insulin NPH-regular Human (NOVOLIN 70/30) (70-30) 100 UNIT/ML injection 70 units with breakfast, and 30 units with supper. 30 mL 11  . levothyroxine (SYNTHROID, LEVOTHROID) 125 MCG tablet TAKE ONE TABLET BY MOUTH ONCE DAILY 90 tablet 0  . Menaquinone-7 (VITAMIN K2 PO) Take 200 mg by mouth daily.      . metFORMIN (GLUCOPHAGE) 1000 MG tablet TAKE 1 TABLET BY MOUTH TWICE A DAY WITH A MEAL 60 tablet 0  . metoprolol succinate (TOPROL-XL) 25 MG 24 hr tablet TAKE ONE-HALF TABLET BY MOUTH ONCE DAILY (Patient not taking: Reported on 03/29/2016) 45 tablet 0  . MILK THISTLE PO Take 2 capsules by mouth daily.    . Multiple Vitamin (MULTIVITAMIN) tablet Take 1 tablet by mouth daily. Prenatal    . omeprazole (PRILOSEC) 40 MG capsule Take 1 capsule (40 mg total) by mouth daily. 90 capsule 3  . ondansetron (ZOFRAN ODT) 8 MG disintegrating tablet Take 1 tablet (8 mg total) by mouth every 8 (eight) hours as needed for nausea. (Patient not taking: Reported on 03/29/2016) 20 tablet 0  . pioglitazone (ACTOS) 45 MG tablet TAKE 1 TABLET BY MOUTH EVERY DAY 90 tablet 0  . prazosin (MINIPRESS) 5 MG capsule Take 5 mg by mouth at bedtime. Reported on 03/29/2016    . Probiotic Product (PROBIOTIC COLON SUPPORT PO) Take by mouth. Reported on 12/29/2015    . risperiDONE (RISPERDAL)  2 MG tablet Take 2 mg by mouth daily.     . rizatriptan (MAXALT) 10 MG tablet Take 1 tablet (10 mg total) by mouth as needed for migraine. (Patient not taking: Reported on 03/29/2016) 10 tablet 11  . rosuvastatin (CRESTOR) 20 MG tablet Take 1 tablet (20 mg total) by mouth daily. 30 tablet 11  . sertraline (ZOLOFT) 100 MG tablet Take 100 mg by mouth daily. Take 2 every day    . topiramate (TOPAMAX) 100 MG tablet Take 1 tablet (100 mg total) by mouth 2 (two) times daily. 60 tablet 5  . valACYclovir (VALTREX) 1000 MG tablet Take 1 tablet (1,000 mg total) by mouth 2 (two) times daily. (Patient not taking: Reported on 01/28/2016) 60 tablet 5   No current facility-administered medications on file prior to visit.     Past Medical History  Diagnosis Date  . Hypothyroidism     sees Dr. Elyse Hsu  . GERD (gastroesophageal reflux disease)   . Hyperlipidemia   . Hypertension   . Migraine syndrome   . Neuropathy associated with endocrine disorder ([place])    . Asthma   . Neck pain   . Insomnia   . Depression     sees Dr. Matilde Haymaker in [place]   . Diabetes mellitus     type 2, on insulin pump, sees Dr. Loanne Drilling   . Morbid obesity ([place])   . Anxiety   . Cardiac arrhythmia   . Arthritis   . Sleep apnea with use of continuous positive airway pressure (CPAP)     on CPAP  . Bowel obstruction ([place])   . 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 Byetta 10 Mcg Pen [Exenatide] Hives  . Clindamycin/Lincomycin Nausea And Vomiting  . Penicillins   . Tramadol Hcl   . Avocado Diarrhea and Other (See Comments)    Severe cramping, sweats, and diarrhea in upper GI/stomach    Social History   Social History  . Marital Status: Married    Spouse Name: N/A  . Number of Children: 5  . Years of Education: N/A   Occupational History  . Nurse, learning disability    Social History Main Topics  . Smoking status: Former Smoker    Types: Cigarettes  . Smokeless tobacco: Never Used     Comment: quit 03/25/2016  . Alcohol Use: No  . Drug Use: No  . Sexual Activity: Yes   Other Topics Concern  . None   Social History Narrative    Filed Vitals:   05/05/16 1341  BP: 120/80  Pulse: 70  Temp: 98.9 F (37.2 C)  Resp: 12   Body mass index is 50.48 kg/(m^2).  SpO2 Readings from Last 3 Encounters:  05/05/16 97%  03/29/16 97%  03/22/16 97%     Physical Exam  Constitutional: She is oriented to person, place, and time. She appears well-developed. She appears distressed (due to headache.).  HENT:  Head: Atraumatic.  Nose: Right sinus exhibits frontal sinus tenderness. Right sinus exhibits no maxillary sinus tenderness. Left sinus exhibits no maxillary sinus tenderness and no frontal sinus tenderness.  Eyes: Conjunctivae and EOM are normal. Pupils are equal, round, and reactive to light.  Fundoscopic exam:      The right eye shows no hemorrhage and no papilledema.       The left eye shows no hemorrhage  and no papilledema.  Photophobia, most noticeable when evaluating right eye. Bilateral exophthalmos. Mild right  palpebral ptosis.  Cardiovascular: Normal rate and regular rhythm.   No murmur heard. Pulses:      Dorsalis pedis pulses are 2+ on the right side, and 2+ on the left side.  Respiratory: Effort normal and breath sounds normal. No respiratory distress.  GI: Soft. She exhibits no mass. There is no tenderness.  Musculoskeletal: She exhibits no edema.  Lymphadenopathy:    She has no cervical adenopathy.  Neurological: She is alert and oriented to person, place, and time. She has normal strength. No cranial nerve deficit. Coordination normal.  Right upper eyelid intermittent ptosis but no focal deficit appreciated on physical exam. Stable gait assisted with cane  Skin: Skin is warm. No erythema.  Psychiatric: Her speech is normal. Her mood appears anxious.  poor groomed, good eye contact.      ASSESSMENT AND PLAN:     Javier was seen today for headache.  Diagnoses and all orders for this visit:  Headache, unspecified headache type   Because reporting changes or headache and right upper eye lid ptosis, I think it is appropriate to arrange a head CT, stat. She received Toradol 60 mg IM once, she has had this medication before treated for acute migraines.  She developed dizziness and nausea after the injection, stayed in observation for 25-35 minutes, resolved. Clearly instructed about warning signs. Follow-up with PCP in a week.  -     CT Head Wo Contrast; Future -     ketorolac (TORADOL) injection 60 mg; Inject 2 mLs (60 mg total) into the muscle once.  -     POCT CBG monitoring   Other specified hypothyroidism  No changes in current management, will follow labs done today and will give further recommendations accordingly.  -     TSH            -Ms.Natalyia Dreisbach Arnall advised to return or notify a doctor immediately if symptoms worsen or persist or new  concerns arise.       Jakobi Thetford G. Martinique, MD  [facility]. [place] office.

## 2016-05-05 NOTE — Telephone Encounter (Signed)
Patient Name: Miranda [name]  DOB: Oct 10, 1976    Initial Comment Caller states she has head pain like brain freeze, causing right eye to to twitch and shut   Nurse Assessment  Nurse: Mallie Mussel, RN, Alveta Heimlich Date/Time (Eastern Time): 05/05/2016 11:13:09 AM  Confirm and document reason for call. If symptomatic, describe symptoms. You must click the next button to save text entered. ---Caller states that she has a headache which began last night. She rates her pain as 7-8 on 0-10 scale. She states that it is closer to 8. This is not the worst headache of her life. The headache began as a 10 on 0-10 scale. It causes her right eye to twitch and close. She denies pain in her eyeball. Denies head injury. She had a CVA in May and she is concerned. She has a way to check her BP. I had her to check it for me. Current BP is 137/91. Pulse is 71. She denies unilateral weakness and numbness other than what she had before from her previous stroke. No change.  Has the patient traveled out of the country within the last 30 days? ---No  Does the patient have any new or worsening symptoms? ---Yes  Will a triage be completed? ---Yes  Related visit to physician within the last 2 weeks? ---No  Does the PT have any chronic conditions? (i.e. diabetes, asthma, etc.) ---Yes  List chronic conditions. ---Diabetes, Hypothyroidism, Hypercholesterolemia,  Is the patient pregnant or possibly pregnant? (Ask all females between the ages of 65-55) ---No  Is this a behavioral health or substance abuse call? ---No     Guidelines    Guideline Title Affirmed Question Affirmed Notes  Headache [1] SEVERE headache AND [2] sudden-onset (i.e., reaching maximum intensity within seconds)    Final Disposition User   Go to ED Now (or PCP triage) Mallie Mussel, RN, Alveta Heimlich    Comments  There are no appointments for Dr. Sarajane Jews for today. I scheduled her to be seen by Dr. Betty Martinique today at 1:45pm.   Referrals  REFERRED TO PCP OFFICE   Disagree/Comply:  Comply

## 2016-05-05 NOTE — Telephone Encounter (Signed)
Noted. Patient will see Dr. Martinique today

## 2016-05-07 ENCOUNTER — Encounter: Payer: Self-pay | Admitting: Family Medicine

## 2016-05-09 ENCOUNTER — Telehealth: Payer: Self-pay | Admitting: Family Medicine

## 2016-05-09 ENCOUNTER — Ambulatory Visit: Payer: BLUE CROSS/BLUE SHIELD | Admitting: Psychology

## 2016-05-09 NOTE — Telephone Encounter (Signed)
Pervis Hocking, PHD [place] at [facility] Dr. [phone]) called to get advice for the patient. The patient seen Dr. Martinique Friday 05/05/16 for pain above her eye. She had a CT scan yesterday 05/08/2016 and now she is in [place] office today slurring words, confused and still has head pain. She also told Pervis Hocking that she sleeps non stop and she had a stroke about a month or so ago. Pervis Hocking was wanting to know if she needed to have the patient come here to be seen or what did she need to do. She didn't want her to drive. I consulted with [place] [facility] and with Kateri Mc to see what they suggested for Pervis Hocking to do. They suggested for her to call 911 and have her sent to the ED. When I relayed the message to Opal Sidles she said that the patient had someone coming to get her and take her to the hospital.

## 2016-05-11 ENCOUNTER — Ambulatory Visit: Payer: BLUE CROSS/BLUE SHIELD | Admitting: Psychology

## 2016-05-15 ENCOUNTER — Ambulatory Visit (INDEPENDENT_AMBULATORY_CARE_PROVIDER_SITE_OTHER): Payer: BLUE CROSS/BLUE SHIELD | Admitting: Psychology

## 2016-05-15 DIAGNOSIS — F4481 Dissociative identity disorder: Secondary | ICD-10-CM | POA: Diagnosis not present

## 2016-05-15 DIAGNOSIS — F4310 Post-traumatic stress disorder, unspecified: Secondary | ICD-10-CM | POA: Diagnosis not present

## 2016-05-16 ENCOUNTER — Ambulatory Visit (INDEPENDENT_AMBULATORY_CARE_PROVIDER_SITE_OTHER): Payer: BLUE CROSS/BLUE SHIELD | Admitting: Family Medicine

## 2016-05-16 ENCOUNTER — Encounter: Payer: Self-pay | Admitting: Family Medicine

## 2016-05-16 VITALS — BP 112/78 | Temp 98.8°F | Ht 61.0 in | Wt 265.0 lb

## 2016-05-16 DIAGNOSIS — R51 Headache: Secondary | ICD-10-CM | POA: Diagnosis not present

## 2016-05-16 DIAGNOSIS — I10 Essential (primary) hypertension: Secondary | ICD-10-CM

## 2016-05-16 DIAGNOSIS — R519 Headache, unspecified: Secondary | ICD-10-CM

## 2016-05-16 DIAGNOSIS — G459 Transient cerebral ischemic attack, unspecified: Secondary | ICD-10-CM

## 2016-05-16 DIAGNOSIS — I63512 Cerebral infarction due to unspecified occlusion or stenosis of left middle cerebral artery: Secondary | ICD-10-CM

## 2016-05-16 DIAGNOSIS — G8929 Other chronic pain: Secondary | ICD-10-CM

## 2016-05-16 NOTE — Progress Notes (Signed)
Pre visit review using our clinic review tool, if applicable. No additional management support is needed unless otherwise documented below in the visit note. 

## 2016-05-16 NOTE — Progress Notes (Signed)
   Subjective:    Patient ID: Miranda [name], female    DOB: 1975/12/27, 40 y.o.   MRN: [id number]  HPI Here to follow up after a hospital stay at [facility] from 05-09-16 to 05-10-16 for a TIA. This started with a severe headache over the right eye about 4 days prior to that, and on the day of admission she had the sudden onset of slurred speech and weakness in the right arm and leg. MRI scan of the brain revealed the 2 prior strokes but no new lesions, and the symptoms resolved within 24 hours. Now she is almost back to baseline although she still has a mild chronic headache and she has difficulty expressing herself. She is seeing Dr. Adria Dill of [facility] Neurology. She has some chronic sinus problems and she will see Dr. Janace Hoard of ENT next Monday. She was taken off aspirin but she still takes Plavix.   Review of Systems  Constitutional: Negative.   Respiratory: Negative.   Cardiovascular: Negative.   Neurological: Positive for speech difficulty and headaches. Negative for dizziness, tremors, seizures, syncope, facial asymmetry, weakness, light-headedness and numbness.       Objective:   Physical Exam  Constitutional: She is oriented to person, place, and time. She appears well-developed and well-nourished. No distress.  Neck: No thyromegaly present.  Cardiovascular: Normal rate, regular rhythm, normal heart sounds and intact distal pulses.   Pulmonary/Chest: Effort normal and breath sounds normal.  Lymphadenopathy:    She has no cervical adenopathy.  Neurological: She is alert and oriented to person, place, and time. She exhibits normal muscle tone. Coordination normal.  She has expressive aphasia and has to take long pauses to find the words she wants to say          Assessment & Plan:  Here after another TIA, and she has had 2 recent strokes. She has residual neurologic deficits and a chronic headache. She will see ENT and Neurology as above.  Laurey Morale, MD

## 2016-05-29 ENCOUNTER — Other Ambulatory Visit: Payer: Self-pay

## 2016-05-29 MED ORDER — PIOGLITAZONE HCL 45 MG PO TABS: 45.0000 mg | ORAL_TABLET | Freq: Every day | ORAL | 0 refills | Status: DC

## 2016-06-01 ENCOUNTER — Ambulatory Visit: Payer: BLUE CROSS/BLUE SHIELD | Admitting: Psychology

## 2016-06-05 ENCOUNTER — Ambulatory Visit (INDEPENDENT_AMBULATORY_CARE_PROVIDER_SITE_OTHER): Payer: BLUE CROSS/BLUE SHIELD | Admitting: Psychology

## 2016-06-05 DIAGNOSIS — F4310 Post-traumatic stress disorder, unspecified: Secondary | ICD-10-CM

## 2016-06-10 ENCOUNTER — Other Ambulatory Visit: Payer: Self-pay | Admitting: Family Medicine

## 2016-06-19 ENCOUNTER — Encounter: Payer: Self-pay | Admitting: Family Medicine

## 2016-06-19 ENCOUNTER — Ambulatory Visit (INDEPENDENT_AMBULATORY_CARE_PROVIDER_SITE_OTHER): Payer: BLUE CROSS/BLUE SHIELD | Admitting: Family Medicine

## 2016-06-19 VITALS — BP 130/80 | HR 76 | Wt 269.0 lb

## 2016-06-19 DIAGNOSIS — I10 Essential (primary) hypertension: Secondary | ICD-10-CM

## 2016-06-19 DIAGNOSIS — G459 Transient cerebral ischemic attack, unspecified: Secondary | ICD-10-CM

## 2016-06-19 DIAGNOSIS — E1042 Type 1 diabetes mellitus with diabetic polyneuropathy: Secondary | ICD-10-CM | POA: Diagnosis not present

## 2016-06-19 DIAGNOSIS — G43701 Chronic migraine without aura, not intractable, with status migrainosus: Secondary | ICD-10-CM

## 2016-06-19 MED ORDER — CLOPIDOGREL BISULFATE 75 MG PO TABS: 75.0000 mg | ORAL_TABLET | Freq: Every day | ORAL | 3 refills | Status: DC

## 2016-06-19 NOTE — Progress Notes (Signed)
   Subjective:    Patient ID: Miranda [name], female    DOB: 1976/09/16, 40 y.o.   MRN: [id number]  HPI Here to follow up. Se saw Dr. Janace Hoard last month and he did not recommend any procedures to remove the cyst in her maxillary sinus. He will see her in 3 months. She was just discharged from [place] but she continues with PT through her Neurologist Dr. Guy Begin. She feels well in general and she has not had any more neurologic events like before. She is frustrated at her inability to lose weight. She feels like her diet is very healthy.    Review of Systems  Constitutional: Negative.   Respiratory: Negative.   Cardiovascular: Negative.   Neurological: Negative.        Objective:   Physical Exam  Constitutional: She is oriented to person, place, and time. She appears well-developed and well-nourished.  Cardiovascular: Normal rate, regular rhythm, normal heart sounds and intact distal pulses.   Pulmonary/Chest: Effort normal and breath sounds normal.  Neurological: She is alert and oriented to person, place, and time.          Assessment & Plan:  Her migraines are stable. Her diabetes is stable. Her HTN is stable. As for the weight issue, I advised her to exercise for 45 mintues a day for 6 days a week. Swimming would be an excellent non-weight bearing exercise that she can do.  Laurey Morale, MD

## 2016-06-20 ENCOUNTER — Ambulatory Visit (INDEPENDENT_AMBULATORY_CARE_PROVIDER_SITE_OTHER): Payer: BLUE CROSS/BLUE SHIELD | Admitting: Psychology

## 2016-06-20 DIAGNOSIS — F4323 Adjustment disorder with mixed anxiety and depressed mood: Secondary | ICD-10-CM | POA: Diagnosis not present

## 2016-06-26 ENCOUNTER — Other Ambulatory Visit: Payer: Self-pay | Admitting: Family Medicine

## 2016-06-26 ENCOUNTER — Ambulatory Visit (INDEPENDENT_AMBULATORY_CARE_PROVIDER_SITE_OTHER): Payer: BLUE CROSS/BLUE SHIELD | Admitting: Psychology

## 2016-06-26 DIAGNOSIS — F4481 Dissociative identity disorder: Secondary | ICD-10-CM | POA: Diagnosis not present

## 2016-06-26 DIAGNOSIS — F4310 Post-traumatic stress disorder, unspecified: Secondary | ICD-10-CM | POA: Diagnosis not present

## 2016-06-27 NOTE — Telephone Encounter (Signed)
Rx refill sent to pharmacy. 

## 2016-07-04 ENCOUNTER — Telehealth: Payer: Self-pay

## 2016-07-04 NOTE — Telephone Encounter (Signed)
Received PA request from [facility] for Ciprodex. Patient was seen back in May 2017 for the ear issue. The Ciprodex is not on her medication list, and another ear drop had been sent in during the visit. Please advise.

## 2016-07-06 NOTE — Telephone Encounter (Signed)
I spoke with pt and she does not need any more of the ear drops.

## 2016-07-17 ENCOUNTER — Ambulatory Visit (INDEPENDENT_AMBULATORY_CARE_PROVIDER_SITE_OTHER): Payer: BLUE CROSS/BLUE SHIELD | Admitting: Psychology

## 2016-07-17 DIAGNOSIS — F4481 Dissociative identity disorder: Secondary | ICD-10-CM | POA: Diagnosis not present

## 2016-07-17 DIAGNOSIS — F4310 Post-traumatic stress disorder, unspecified: Secondary | ICD-10-CM

## 2016-07-20 ENCOUNTER — Ambulatory Visit (INDEPENDENT_AMBULATORY_CARE_PROVIDER_SITE_OTHER): Payer: BLUE CROSS/BLUE SHIELD | Admitting: Psychology

## 2016-07-20 DIAGNOSIS — F4481 Dissociative identity disorder: Secondary | ICD-10-CM

## 2016-07-20 DIAGNOSIS — F4310 Post-traumatic stress disorder, unspecified: Secondary | ICD-10-CM | POA: Diagnosis not present

## 2016-08-07 ENCOUNTER — Ambulatory Visit (INDEPENDENT_AMBULATORY_CARE_PROVIDER_SITE_OTHER): Payer: BLUE CROSS/BLUE SHIELD | Admitting: Psychology

## 2016-08-07 DIAGNOSIS — F4310 Post-traumatic stress disorder, unspecified: Secondary | ICD-10-CM | POA: Diagnosis not present

## 2016-08-07 DIAGNOSIS — F449 Dissociative and conversion disorder, unspecified: Secondary | ICD-10-CM | POA: Diagnosis not present

## 2016-08-27 ENCOUNTER — Other Ambulatory Visit: Payer: Self-pay | Admitting: Endocrinology

## 2016-08-28 ENCOUNTER — Ambulatory Visit (INDEPENDENT_AMBULATORY_CARE_PROVIDER_SITE_OTHER): Payer: BLUE CROSS/BLUE SHIELD | Admitting: Psychology

## 2016-08-28 DIAGNOSIS — F4481 Dissociative identity disorder: Secondary | ICD-10-CM | POA: Diagnosis not present

## 2016-08-28 DIAGNOSIS — F4310 Post-traumatic stress disorder, unspecified: Secondary | ICD-10-CM | POA: Diagnosis not present

## 2016-08-28 NOTE — Telephone Encounter (Signed)
Please refill x 1 Ov is due  

## 2016-08-29 ENCOUNTER — Encounter: Payer: Self-pay | Admitting: Family Medicine

## 2016-08-29 ENCOUNTER — Ambulatory Visit (INDEPENDENT_AMBULATORY_CARE_PROVIDER_SITE_OTHER): Payer: BLUE CROSS/BLUE SHIELD | Admitting: Family Medicine

## 2016-08-29 VITALS — BP 112/70 | Temp 98.3°F | Ht 61.0 in | Wt 265.0 lb

## 2016-08-29 DIAGNOSIS — R309 Painful micturition, unspecified: Secondary | ICD-10-CM | POA: Diagnosis not present

## 2016-08-29 DIAGNOSIS — N3001 Acute cystitis with hematuria: Secondary | ICD-10-CM

## 2016-08-29 LAB — POC URINALSYSI DIPSTICK (AUTOMATED)
BILIRUBIN UA: NEGATIVE
Ketones, UA: NEGATIVE
Leukocytes, UA: NEGATIVE
NITRITE UA: NEGATIVE
PH UA: 5
Protein, UA: NEGATIVE
UROBILINOGEN UA: 0.2

## 2016-08-29 MED ORDER — CIPROFLOXACIN HCL 500 MG PO TABS
500.0000 mg | ORAL_TABLET | Freq: Two times a day (BID) | ORAL | 0 refills | Status: DC
Start: 2016-08-29 — End: 2016-10-20

## 2016-08-29 NOTE — Progress Notes (Signed)
   Subjective:    Patient ID: Miranda [name], female    DOB: 18-Jun-1976, 40 y.o.   MRN: [id number]  HPI Here for one week of increased urgency to urinate with burning. No fever. She is drinking water.    Review of Systems  Constitutional: Negative.   Respiratory: Negative.   Cardiovascular: Negative.   Gastrointestinal: Negative.   Genitourinary: Positive for dysuria, frequency and urgency. Negative for flank pain.       Objective:   Physical Exam  Constitutional: She appears well-developed and well-nourished.  Cardiovascular: Normal rate, regular rhythm, normal heart sounds and intact distal pulses.   Pulmonary/Chest: Effort normal and breath sounds normal.  Abdominal: Soft. Bowel sounds are normal. She exhibits no distension and no mass. There is no tenderness. There is no rebound and no guarding.          Assessment & Plan:  UTI, treat with Cipro. Culture the sample.  Laurey Morale, MD

## 2016-08-29 NOTE — Progress Notes (Signed)
Pre visit review using our clinic review tool, if applicable. No additional management support is needed unless otherwise documented below in the visit note. 

## 2016-08-30 ENCOUNTER — Ambulatory Visit (INDEPENDENT_AMBULATORY_CARE_PROVIDER_SITE_OTHER): Payer: BLUE CROSS/BLUE SHIELD | Admitting: Psychology

## 2016-08-30 DIAGNOSIS — F449 Dissociative and conversion disorder, unspecified: Secondary | ICD-10-CM

## 2016-08-30 DIAGNOSIS — F4310 Post-traumatic stress disorder, unspecified: Secondary | ICD-10-CM | POA: Diagnosis not present

## 2016-08-31 LAB — URINE CULTURE

## 2016-09-01 ENCOUNTER — Other Ambulatory Visit: Payer: Self-pay | Admitting: Endocrinology

## 2016-09-14 ENCOUNTER — Ambulatory Visit (INDEPENDENT_AMBULATORY_CARE_PROVIDER_SITE_OTHER): Payer: BLUE CROSS/BLUE SHIELD | Admitting: Psychology

## 2016-09-14 DIAGNOSIS — F4310 Post-traumatic stress disorder, unspecified: Secondary | ICD-10-CM

## 2016-09-14 DIAGNOSIS — F4481 Dissociative identity disorder: Secondary | ICD-10-CM | POA: Diagnosis not present

## 2016-09-18 ENCOUNTER — Ambulatory Visit (INDEPENDENT_AMBULATORY_CARE_PROVIDER_SITE_OTHER): Payer: BLUE CROSS/BLUE SHIELD | Admitting: Psychology

## 2016-09-18 DIAGNOSIS — F4481 Dissociative identity disorder: Secondary | ICD-10-CM | POA: Diagnosis not present

## 2016-09-18 DIAGNOSIS — F4310 Post-traumatic stress disorder, unspecified: Secondary | ICD-10-CM

## 2016-09-26 ENCOUNTER — Telehealth: Payer: Self-pay | Admitting: Family Medicine

## 2016-09-26 NOTE — Telephone Encounter (Signed)
Patient came in complaining of right jaw hurting. Patient states that she went to the ENT last week. 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

## 2016-09-26 NOTE — Telephone Encounter (Signed)
Call in a Medrol dose pack  

## 2016-09-27 NOTE — Telephone Encounter (Signed)
Spoke to patient in regards to her prescription for Medrol pack that was send to [place] by Dr Sarajane Jews. Patient verbalized understanding.

## 2016-09-29 ENCOUNTER — Ambulatory Visit (INDEPENDENT_AMBULATORY_CARE_PROVIDER_SITE_OTHER): Payer: BLUE CROSS/BLUE SHIELD | Admitting: Psychology

## 2016-09-29 DIAGNOSIS — F4310 Post-traumatic stress disorder, unspecified: Secondary | ICD-10-CM

## 2016-10-03 ENCOUNTER — Telehealth: Payer: Self-pay | Admitting: Family Medicine

## 2016-10-03 ENCOUNTER — Encounter: Payer: Self-pay | Admitting: Family Medicine

## 2016-10-03 DIAGNOSIS — H9203 Otalgia, bilateral: Secondary | ICD-10-CM

## 2016-10-03 NOTE — Telephone Encounter (Signed)
I did a stat referral

## 2016-10-03 NOTE — Telephone Encounter (Signed)
Duplicate, see previous note 

## 2016-10-03 NOTE — Telephone Encounter (Signed)
Pt calling to let you know she has not gotten any better and would like to come in before the Friday that was available at 3:15 did not take appt slot.

## 2016-10-04 NOTE — Telephone Encounter (Signed)
Dr. Sarajane Jews did a STAT referral and I sent pt a my chart message with this information.

## 2016-10-09 ENCOUNTER — Ambulatory Visit (INDEPENDENT_AMBULATORY_CARE_PROVIDER_SITE_OTHER): Payer: BLUE CROSS/BLUE SHIELD | Admitting: Psychology

## 2016-10-09 DIAGNOSIS — F449 Dissociative and conversion disorder, unspecified: Secondary | ICD-10-CM

## 2016-10-09 DIAGNOSIS — F4310 Post-traumatic stress disorder, unspecified: Secondary | ICD-10-CM

## 2016-10-10 ENCOUNTER — Ambulatory Visit (INDEPENDENT_AMBULATORY_CARE_PROVIDER_SITE_OTHER): Payer: BLUE CROSS/BLUE SHIELD | Admitting: Psychology

## 2016-10-10 DIAGNOSIS — F4481 Dissociative identity disorder: Secondary | ICD-10-CM | POA: Diagnosis not present

## 2016-10-10 DIAGNOSIS — F4310 Post-traumatic stress disorder, unspecified: Secondary | ICD-10-CM | POA: Diagnosis not present

## 2016-10-16 ENCOUNTER — Ambulatory Visit (INDEPENDENT_AMBULATORY_CARE_PROVIDER_SITE_OTHER): Payer: BLUE CROSS/BLUE SHIELD | Admitting: Psychology

## 2016-10-16 DIAGNOSIS — F4310 Post-traumatic stress disorder, unspecified: Secondary | ICD-10-CM | POA: Diagnosis not present

## 2016-10-16 DIAGNOSIS — F4481 Dissociative identity disorder: Secondary | ICD-10-CM | POA: Diagnosis not present

## 2016-10-18 ENCOUNTER — Emergency Department (HOSPITAL_COMMUNITY)
Admission: EM | Admit: 2016-10-18 | Discharge: 2016-10-20 | Disposition: A | Discharge status: Other Acute Inpatient Hospital | Payer: BLUE CROSS/BLUE SHIELD | Attending: Emergency Medicine | Admitting: Emergency Medicine

## 2016-10-18 ENCOUNTER — Encounter (HOSPITAL_COMMUNITY): Payer: Self-pay

## 2016-10-18 DIAGNOSIS — I10 Essential (primary) hypertension: Secondary | ICD-10-CM | POA: Insufficient documentation

## 2016-10-18 DIAGNOSIS — Z79899 Other long term (current) drug therapy: Secondary | ICD-10-CM | POA: Insufficient documentation

## 2016-10-18 DIAGNOSIS — Z794 Long term (current) use of insulin: Secondary | ICD-10-CM | POA: Insufficient documentation

## 2016-10-18 DIAGNOSIS — Z7984 Long term (current) use of oral hypoglycemic drugs: Secondary | ICD-10-CM | POA: Insufficient documentation

## 2016-10-18 DIAGNOSIS — J45909 Unspecified asthma, uncomplicated: Secondary | ICD-10-CM | POA: Diagnosis not present

## 2016-10-18 DIAGNOSIS — F333 Major depressive disorder, recurrent, severe with psychotic symptoms: Secondary | ICD-10-CM | POA: Diagnosis present

## 2016-10-18 DIAGNOSIS — Z87891 Personal history of nicotine dependence: Secondary | ICD-10-CM | POA: Diagnosis not present

## 2016-10-18 DIAGNOSIS — Z888 Allergy status to other drugs, medicaments and biological substances status: Secondary | ICD-10-CM | POA: Diagnosis not present

## 2016-10-18 DIAGNOSIS — Z91018 Allergy to other foods: Secondary | ICD-10-CM | POA: Diagnosis not present

## 2016-10-18 DIAGNOSIS — E039 Hypothyroidism, unspecified: Secondary | ICD-10-CM | POA: Diagnosis not present

## 2016-10-18 DIAGNOSIS — E1140 Type 2 diabetes mellitus with diabetic neuropathy, unspecified: Secondary | ICD-10-CM | POA: Insufficient documentation

## 2016-10-18 DIAGNOSIS — Z8673 Personal history of transient ischemic attack (TIA), and cerebral infarction without residual deficits: Secondary | ICD-10-CM | POA: Insufficient documentation

## 2016-10-18 DIAGNOSIS — F061 Catatonic disorder due to known physiological condition: Secondary | ICD-10-CM | POA: Diagnosis present

## 2016-10-18 HISTORY — DX: Dissociative identity disorder: F44.81

## 2016-10-18 LAB — ACETAMINOPHEN LEVEL: Acetaminophen (Tylenol), Serum: <10 ug/mL — ABNORMAL LOW (ref 10–30)

## 2016-10-18 LAB — RAPID URINE DRUG SCREEN, HOSP PERFORMED
AMPHETAMINES: NOT DETECTED
BARBITURATES: NOT DETECTED
Benzodiazepines: NOT DETECTED
Cocaine: NOT DETECTED
OPIATES: NOT DETECTED
TETRAHYDROCANNABINOL: NOT DETECTED

## 2016-10-18 LAB — I-STAT BETA HCG BLOOD, ED (MC, WL, AP ONLY): HCG, QUANTITATIVE: 26.3 m[IU]/mL — AB (ref <5)

## 2016-10-18 LAB — COMPREHENSIVE METABOLIC PANEL
ALK PHOS: 49 U/L (ref 38–126)
ALT: 44 U/L (ref 14–54)
AST: 44 U/L — AB (ref 15–41)
Albumin: 4.4 g/dL (ref 3.5–5.0)
Anion gap: 12 (ref 5–15)
BUN: 9 mg/dL (ref 6–20)
CHLORIDE: 101 mmol/L (ref 101–111)
CO2: 23 mmol/L (ref 22–32)
CREATININE: 0.83 mg/dL (ref 0.44–1.00)
Calcium: 9.4 mg/dL (ref 8.9–10.3)
GFR calc non Af Amer: >60 mL/min (ref >60)
Glucose, Bld: 156 mg/dL — ABNORMAL HIGH (ref 65–99)
Potassium: 3.9 mmol/L (ref 3.5–5.1)
SODIUM: 136 mmol/L (ref 135–145)
Total Bilirubin: 1 mg/dL (ref 0.3–1.2)
Total Protein: 7.9 g/dL (ref 6.5–8.1)

## 2016-10-18 LAB — CBC
HEMATOCRIT: 41.4 % (ref 36.0–46.0)
HEMOGLOBIN: 14.9 g/dL (ref 12.0–15.0)
MCH: 30.4 pg (ref 26.0–34.0)
MCHC: 36 g/dL (ref 30.0–36.0)
MCV: 84.5 fL (ref 78.0–100.0)
Platelets: 255 10*3/uL (ref 150–400)
RBC: 4.9 MIL/uL (ref 3.87–5.11)
RDW: 13.4 % (ref 11.5–15.5)
WBC: 11.8 10*3/uL — ABNORMAL HIGH (ref 4.0–10.5)

## 2016-10-18 LAB — ETHANOL: Alcohol, Ethyl (B): <5 mg/dL (ref <5)

## 2016-10-18 LAB — HCG, QUANTITATIVE, PREGNANCY

## 2016-10-18 LAB — SALICYLATE LEVEL

## 2016-10-18 MED ORDER — CYCLOSPORINE 0.05 % OP EMUL: 1.0000 [drp] | Freq: Two times a day (BID) | OPHTHALMIC | Status: DC

## 2016-10-18 MED ORDER — PRAZOSIN HCL 5 MG PO CAPS
5.0000 mg | ORAL_CAPSULE | Freq: Every day | ORAL | Status: DC
  Administered 2016-10-18: 5 mg via ORAL
  Filled 2016-10-18: qty 1

## 2016-10-18 MED ORDER — GLUCOSAMINE CHONDROITIN JOINT PO TABS: ORAL_TABLET | Freq: Every day | ORAL | Status: DC

## 2016-10-18 MED ORDER — METOPROLOL SUCCINATE 12.5 MG HALF TABLET
12.5000 mg | ORAL_TABLET | Freq: Every day | ORAL | Status: DC
  Administered 2016-10-18 – 2016-10-20 (×3): 12.5 mg via ORAL
  Filled 2016-10-18 (×3): qty 1

## 2016-10-18 MED ORDER — DIPHENOXYLATE-ATROPINE 2.5-0.025 MG PO TABS: 2.0000 | ORAL_TABLET | Freq: Three times a day (TID) | ORAL | Status: DC | PRN

## 2016-10-18 MED ORDER — INSULIN ASPART PROT & ASPART (70-30 MIX) 100 UNIT/ML ~~LOC~~ SUSP
70.0000 [IU] | Freq: Every day | SUBCUTANEOUS | Status: DC
  Administered 2016-10-19 – 2016-10-20 (×2): 70 [IU] via SUBCUTANEOUS
  Filled 2016-10-18: qty 10

## 2016-10-18 MED ORDER — METFORMIN HCL 500 MG PO TABS
1000.0000 mg | ORAL_TABLET | Freq: Two times a day (BID) | ORAL | Status: DC
  Administered 2016-10-19 – 2016-10-20 (×3): 1000 mg via ORAL
  Filled 2016-10-18 (×3): qty 2

## 2016-10-18 MED ORDER — CLOPIDOGREL BISULFATE 75 MG PO TABS
75.0000 mg | ORAL_TABLET | Freq: Every day | ORAL | Status: DC
  Filled 2016-10-18 (×2): qty 1

## 2016-10-18 MED ORDER — HYOSCYAMINE SULFATE 0.125 MG/5ML PO ELIX: 0.1250 mg | ORAL_SOLUTION | ORAL | Status: DC | PRN

## 2016-10-18 MED ORDER — TOPIRAMATE 100 MG PO TABS
100.0000 mg | ORAL_TABLET | Freq: Two times a day (BID) | ORAL | Status: DC
  Administered 2016-10-18 – 2016-10-20 (×3): 100 mg via ORAL
  Filled 2016-10-18 (×3): qty 1

## 2016-10-18 MED ORDER — BUPROPION HCL ER (XL) 150 MG PO TB24: 300.0000 mg | ORAL_TABLET | Freq: Every day | ORAL | Status: DC

## 2016-10-18 MED ORDER — DICYCLOMINE HCL 10 MG PO CAPS: 10.0000 mg | ORAL_CAPSULE | Freq: Three times a day (TID) | ORAL | Status: DC

## 2016-10-18 MED ORDER — INSULIN ASPART PROT & ASPART (70-30 MIX) 100 UNIT/ML ~~LOC~~ SUSP
30.0000 [IU] | Freq: Every day | SUBCUTANEOUS | Status: DC
  Administered 2016-10-19: 30 [IU] via SUBCUTANEOUS
  Filled 2016-10-18: qty 10

## 2016-10-18 MED ORDER — ALBUTEROL SULFATE HFA 108 (90 BASE) MCG/ACT IN AERS: 2.0000 | INHALATION_SPRAY | RESPIRATORY_TRACT | Status: DC | PRN

## 2016-10-18 MED ORDER — ROSUVASTATIN CALCIUM 20 MG PO TABS
20.0000 mg | ORAL_TABLET | Freq: Every day | ORAL | Status: DC
  Administered 2016-10-18 – 2016-10-20 (×3): 20 mg via ORAL
  Filled 2016-10-18 (×3): qty 1

## 2016-10-18 MED ORDER — PANTOPRAZOLE SODIUM 40 MG PO TBEC
40.0000 mg | DELAYED_RELEASE_TABLET | Freq: Every day | ORAL | Status: DC
  Administered 2016-10-19 – 2016-10-20 (×2): 40 mg via ORAL
  Filled 2016-10-18 (×2): qty 1

## 2016-10-18 MED ORDER — LEVOTHYROXINE SODIUM 125 MCG PO TABS
125.0000 ug | ORAL_TABLET | Freq: Every day | ORAL | Status: DC
  Administered 2016-10-19 – 2016-10-20 (×2): 125 ug via ORAL
  Filled 2016-10-18 (×2): qty 1

## 2016-10-18 MED ORDER — PIOGLITAZONE HCL 45 MG PO TABS
45.0000 mg | ORAL_TABLET | Freq: Every day | ORAL | Status: DC
  Administered 2016-10-18 – 2016-10-20 (×3): 45 mg via ORAL
  Filled 2016-10-18 (×3): qty 1

## 2016-10-18 NOTE — ED Notes (Signed)
Pt awake, alert & responsive, slightly confused, presents with IVC papers, transferred from [place].  Per IVC papers drove away from home, was found in parking lot at [facility], didn't know where she was.  Pt reported SI and left a note stating she would end it.  Pt not forthcoming with information at present.  Mumbling at present.  Monitoring for safety, Q 15 min checks in effect.  SAFETY CHECK FOR CONTRABAND COMPLETED, NO ITEMS FOUND.

## 2016-10-18 NOTE — ED Triage Notes (Signed)
Pt BIB [facility]. Silver Alert was activated for pt earlier today because she left home without telling anyone. 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 Pt staring straight ahead and not verbally responding to questions in triage, but was complaint otherwise.

## 2016-10-18 NOTE — ED Notes (Signed)
Trey, TTS, in with pt.

## 2016-10-18 NOTE — ED Notes (Signed)
Encouraged pt to eat dinner.  Pt stated "I don't want it".

## 2016-10-18 NOTE — ED Provider Notes (Signed)
[facility] DEPT Provider Note   CSN: [id number] Arrival date & time: 10/18/16  1617     History   Chief Complaint Chief Complaint  Patient presents with  . Psychiatric Evaluation    HPI Miranda [name] is a 40 y.o. female.  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 According to her caregiver who lives with the patient she states that she has 170 documented different personalities. Over the last day or so patient's main personality as not indicated. This is caused her to go on the states where she will not communicate. According to the caregiver, patient taking some suicidal ideations without a definitive plan yesterday. Her caregiver is unsure if she's been compliant with her medications. There's been no reported history of fever, cough, head trauma. She is currently under IVC which was placed by her caregiver. History of prior psychiatric hospitalization in the past      Past Medical History:  Diagnosis Date  . Anxiety   . Arthritis   . Asthma   . Bowel obstruction   . Cardiac arrhythmia   . Depression    sees Dr. Matilde Haymaker in [place]   . Diabetes mellitus    type 2, on insulin pump, sees Dr. Loanne Drilling   . GERD (gastroesophageal reflux disease)   . Hyperlipidemia   . Hypertension   . Hypothyroidism    sees Dr. Elyse Hsu  . Insomnia   . Migraine syndrome   . Morbid obesity ([place])   . Neck pain   . Neuropathy associated with endocrine disorder ([place])   . Sleep apnea with use of continuous positive airway pressure (CPAP)    on CPAP  . Stroke [facility]) 03-25-16   left MCA     Patient Active Problem List   Diagnosis Date Noted  . TIA (transient ischemic attack) 05/16/2016  . Arterial ischemic stroke, MCA, left, acute ([place]) 03/29/2016  . Plantar fasciitis 08/08/2013  . Obesity, Class III, BMI 40-49.9 (morbid obesity) ([place]) 04/09/2013  . Diabetes ([place]) 03/31/2013  . Unspecified sleep apnea  03/31/2013  . PCO (polycystic ovaries) 04/11/2012  . NECK PAIN 11/21/2010  . FATTY LIVER DISEASE 01/26/2010  . NAUSEA WITH VOMITING 01/26/2010  . ABDOMINAL PAIN, EPIGASTRIC 01/26/2010  . PORTAL HYPERTENSION 01/26/2010  . ABDOMINAL PAIN, RIGHT UPPER QUADRANT 12/27/2009  . Migraine headache 06/28/2009  . ACUTE BRONCHITIS 06/08/2009  . ABDOMINAL PAIN, RIGHT LOWER QUADRANT 11/24/2008  . BREAST MASS 08/21/2008  . KNEE PAIN 04/14/2008  . Hypothyroidism 03/17/2008  . Hyperlipidemia 03/17/2008  . DEPRESSION 03/17/2008  . NEUROPATHY 03/17/2008  . Essential hypertension 03/17/2008  . ASTHMA 03/17/2008  . GERD 03/17/2008  . Headache 03/17/2008    Past Surgical History:  Procedure Laterality Date  . blocked itestinal repair  77   age 55 months  . CHOLECYSTECTOMY  2011  . INDUCED ABORTION  1996   forced abortion  . INGUINAL HERNIA REPAIR Left 1980   age 48  . LAPAROSCOPIC ENDOMETRIOSIS FULGURATION  1998  . [place] EXTRACTION  2007    OB History    No data available       Home Medications    Prior to Admission medications   Medication Sig Start Date End Date Taking? Authorizing Provider  albuterol (PROAIR HFA) 108 (90 BASE) MCG/ACT inhaler Inhale 2 puffs into the lungs every 4 (four) hours as needed for wheezing. 04/29/13   Laurey Morale, MD  buPROPion (WELLBUTRIN XL) 300 MG 24 hr tablet Take  300 mg by mouth daily.    Historical Provider, MD  ciprofloxacin (CIPRO) 500 MG tablet Take 1 tablet (500 mg total) by mouth 2 (two) times daily. 08/29/16   Laurey Morale, MD  clopidogrel (PLAVIX) 75 MG tablet Take 1 tablet (75 mg total) by mouth daily. 06/19/16   Laurey Morale, MD  cyclobenzaprine (FLEXERIL) 10 MG tablet Take 1 tablet (10 mg total) by mouth 3 (three) times daily as needed for muscle spasms. 04/28/13   Laurey Morale, MD  cycloSPORINE (RESTASIS) 0.05 % ophthalmic emulsion 1 drop 2 (two) times daily. Reported on 12/29/2015    Historical Provider, MD  dicyclomine (BENTYL) 10 MG  capsule Take 1 capsule (10 mg total) by mouth 3 (three) times daily before meals. 12/02/15   Amy S Esterwood, PA-C  diphenoxylate-atropine (LOMOTIL) 2.5-0.025 MG tablet Take 2 tablets by mouth 3 (three) times daily as needed for diarrhea or loose stools. 11/02/15   Laurey Morale, MD  Fexofenadine HCl (ALLEGRA PO) Take 1 tablet by mouth daily.    Historical Provider, MD  Glucos-Chondroit-Hyaluron-MSM (GLUCOSAMINE CHONDROITIN JOINT PO) Take 2 tablets by mouth daily.    Historical Provider, MD  glucose blood (BAYER CONTOUR NEXT TEST) test strip 1 each by Other route 4 (four) times daily. And lancets 4/day 04/15/13   Renato Shin, MD  hydrOXYzine (VISTARIL) 50 MG capsule Take 1 tablet by mouth daily as needed.  10/10/12   Historical Provider, MD  hyoscyamine (LEVSIN) 0.125 MG/5ML ELIX Take 0.125 mg by mouth as needed. Reported on 05/16/2016    Historical Provider, MD  levothyroxine (SYNTHROID, LEVOTHROID) 125 MCG tablet TAKE 1 TABLET BY MOUTH EVERY DAY 06/12/16   Laurey Morale, MD  Menaquinone-7 (VITAMIN K2 PO) Take 200 mg by mouth daily.    Historical Provider, MD  metFORMIN (GLUCOPHAGE) 1000 MG tablet TAKE 1 TABLET BY MOUTH TWICE A DAY WITH A MEAL 08/31/14   Renato Shin, MD  metoprolol succinate (TOPROL-XL) 25 MG 24 hr tablet TAKE 1/2 TABLET BY MOUTH EVERY DAY 06/27/16   Laurey Morale, MD  MILK THISTLE PO Take 1 capsule by mouth daily. 1000 mg    Historical Provider, MD  Multiple Vitamin (MULTIVITAMIN) tablet Take 1 tablet by mouth daily. Prenatal    Historical Provider, MD  NOVOLIN 70/30 RELION (70-30) 100 UNIT/ML injection INJECT 70 UNITS WITH BREAKFAST AND 30 UNITS WITH SUPPER 08/28/16   Renato Shin, MD  omeprazole (PRILOSEC) 40 MG capsule Take 1 capsule (40 mg total) by mouth daily. 12/29/15   Ladene Artist, MD  ondansetron (ZOFRAN ODT) 8 MG disintegrating tablet Take 1 tablet (8 mg total) by mouth every 8 (eight) hours as needed for nausea. Patient not taking: Reported on 08/29/2016 12/29/13   Harden Mo, MD  pioglitazone (ACTOS) 45 MG tablet TAKE 1 TABLET(45 MG) BY MOUTH DAILY 09/01/16   Renato Shin, MD  prazosin (MINIPRESS) 5 MG capsule Take 5 mg by mouth at bedtime. Reported on 03/29/2016    Historical Provider, MD  Probiotic Product (PROBIOTIC COLON SUPPORT PO) Take by mouth. Reported on 05/16/2016    Historical Provider, MD  risperiDONE (RISPERDAL) 2 MG tablet Take 2 mg by mouth daily.     Historical Provider, MD  rizatriptan (MAXALT) 10 MG tablet Take 1 tablet (10 mg total) by mouth as needed for migraine. 09/17/12   Laurey Morale, MD  rosuvastatin (CRESTOR) 20 MG tablet Take 1 tablet (20 mg total) by mouth daily. 03/30/16   Ishmael Holter  Sarajane Jews, MD  sertraline (ZOLOFT) 100 MG tablet Take 100 mg by mouth daily. Take 2 every day    Historical Provider, MD  topiramate (TOPAMAX) 100 MG tablet Take 1 tablet (100 mg total) by mouth 2 (two) times daily. 01/14/16   Laurey Morale, MD  valACYclovir (VALTREX) 1000 MG tablet Take 1 tablet (1,000 mg total) by mouth 2 (two) times daily. 07/01/14   Laurey Morale, MD    Family History Family History  Problem Relation Age of Onset  . Adopted: Yes  . Heart disease      on both sides of family  . Diabetes      on both sides of family    Social History Social History  Substance Use Topics  . Smoking status: Former Smoker    Types: Cigarettes  . Smokeless tobacco: Never Used     Comment: quit 03/25/2016  . Alcohol use No     Allergies   Byetta 10 mcg pen [exenatide]; Clindamycin/lincomycin; Penicillins; Tramadol hcl; and Avocado   Review of Systems Review of Systems  Unable to perform ROS: Psychiatric disorder     Physical Exam Updated Vital Signs BP 118/70   Pulse 110   Temp 98.7 F (37.1 C) (Oral)   Resp 18   SpO2 98%   Physical Exam  Constitutional: She is oriented to person, place, and time. She appears well-developed and well-nourished.  Non-toxic appearance. No distress.  HENT:  Head: Normocephalic and atraumatic.  Eyes:  Conjunctivae, EOM and lids are normal. Pupils are equal, round, and reactive to light.  Neck: Normal range of motion. Neck supple. No tracheal deviation present. No thyroid mass present.  Cardiovascular: Regular rhythm and normal heart sounds.  Tachycardia present.  Exam reveals no gallop.   No murmur heard. Pulmonary/Chest: Effort normal and breath sounds normal. No stridor. No respiratory distress. She has no decreased breath sounds. She has no wheezes. She has no rhonchi. She has no rales.  Abdominal: Soft. Normal appearance and bowel sounds are normal. She exhibits no distension. There is no tenderness. There is no rebound and no CVA tenderness.  Musculoskeletal: Normal range of motion. She exhibits no edema or tenderness.  Neurological: She is alert and oriented to person, place, and time. She displays no tremor. No cranial nerve deficit or sensory deficit. GCS eye subscore is 4. GCS verbal subscore is 2. GCS motor subscore is 5.  Skin: Skin is warm and dry. No abrasion and no rash noted.  Psychiatric: She is inattentive.  Nursing note and vitals reviewed.    ED Treatments / Results  Labs (all labs ordered are listed, but only abnormal results are displayed) Labs Reviewed  COMPREHENSIVE METABOLIC PANEL  ETHANOL  SALICYLATE LEVEL  ACETAMINOPHEN LEVEL  CBC  RAPID URINE DRUG SCREEN, HOSP PERFORMED  I-STAT BETA HCG BLOOD, ED ([facility], [facility], [facility] ONLY)    EKG  EKG Interpretation None       Radiology No results found.  Procedures Procedures (including critical care time)  Medications Ordered in ED Medications - No data to display   Initial Impression / Assessment and Plan / ED Course  I have reviewed the triage vital signs and the nursing notes.  Pertinent labs & imaging results that were available during my care of the patient were reviewed by me and considered in my medical decision making (see chart for details).  Clinical Course     Patient is medically cleared for  psychiatric disposition  Final Clinical Impressions(s) / ED  Diagnoses   Final diagnoses:  None    New Prescriptions New Prescriptions   No medications on file     Lacretia Leigh, MD 10/18/16 2049

## 2016-10-18 NOTE — ED Notes (Signed)
Pt's contact:  Raymon Mutton, [phone].

## 2016-10-18 NOTE — ED Notes (Signed)
Called pharmacy, spoke with Janett Billow to request all 2030 meds due.

## 2016-10-18 NOTE — BHH Counselor (Signed)
[name] (pt's caregiver) reported she and Dr. Rexene Edison (pt's psychologist) has the pt a bed at [facility] (treatment facility that specializes in traumatic disorders) in [place], [place]. Per [name], the staff at [facility] are requesting discharge summaries from [facility] where the pt was hospitalized for two strokes (in May and June) to see if any deficits were found.    Edd Fabian, MS, [facility], [facility] Triage Specialist [phone]
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
## 2016-10-18 NOTE — ED Notes (Signed)
Primary contact- Raymon Mutton. Pt lives with her as well.

## 2016-10-18 NOTE — ED Notes (Addendum)
Called lab to f/u on hCG quantitative preg results, spoke with Ace, informed had to be diluted & was running presently, should be ready in 10-15 minutes.

## 2016-10-18 NOTE — ED Notes (Addendum)
Belongings placed in locker 40. Belongings include phone, wallet, a jacket, jeans, slides and a shirt.

## 2016-10-19 ENCOUNTER — Ambulatory Visit: Payer: BLUE CROSS/BLUE SHIELD | Admitting: Psychology

## 2016-10-19 DIAGNOSIS — F333 Major depressive disorder, recurrent, severe with psychotic symptoms: Secondary | ICD-10-CM | POA: Diagnosis not present

## 2016-10-19 DIAGNOSIS — R45851 Suicidal ideations: Secondary | ICD-10-CM

## 2016-10-19 DIAGNOSIS — Z91018 Allergy to other foods: Secondary | ICD-10-CM | POA: Diagnosis not present

## 2016-10-19 DIAGNOSIS — Z888 Allergy status to other drugs, medicaments and biological substances status: Secondary | ICD-10-CM

## 2016-10-19 DIAGNOSIS — Z87891 Personal history of nicotine dependence: Secondary | ICD-10-CM

## 2016-10-19 DIAGNOSIS — Z79899 Other long term (current) drug therapy: Secondary | ICD-10-CM

## 2016-10-19 DIAGNOSIS — Z880 Allergy status to penicillin: Secondary | ICD-10-CM

## 2016-10-19 DIAGNOSIS — Z794 Long term (current) use of insulin: Secondary | ICD-10-CM

## 2016-10-19 LAB — CBG MONITORING, ED
GLUCOSE-CAPILLARY: 185 mg/dL — AB (ref 65–99)
GLUCOSE-CAPILLARY: 154 mg/dL — AB (ref 65–99)
Glucose-Capillary: 159 mg/dL — ABNORMAL HIGH (ref 65–99)
Glucose-Capillary: 167 mg/dL — ABNORMAL HIGH (ref 65–99)

## 2016-10-19 MED ORDER — HYDROXYZINE HCL 25 MG PO TABS
50.0000 mg | ORAL_TABLET | Freq: Once | ORAL | Status: AC
  Administered 2016-10-19: 50 mg via ORAL
  Filled 2016-10-19: qty 2

## 2016-10-19 MED ORDER — PRAZOSIN HCL 5 MG PO CAPS
10.0000 mg | ORAL_CAPSULE | Freq: Once | ORAL | Status: AC
  Administered 2016-10-19: 10 mg via ORAL
  Filled 2016-10-19: qty 2

## 2016-10-19 MED ORDER — BUPROPION HCL ER (XL) 150 MG PO TB24
300.0000 mg | ORAL_TABLET | Freq: Every day | ORAL | Status: DC
  Administered 2016-10-20: 300 mg via ORAL
  Filled 2016-10-19 (×2): qty 2

## 2016-10-19 MED ORDER — ONDANSETRON 8 MG PO TBDP
8.0000 mg | ORAL_TABLET | Freq: Once | ORAL | Status: AC
  Administered 2016-10-19: 8 mg via ORAL
  Filled 2016-10-19: qty 1

## 2016-10-19 MED ORDER — LOPERAMIDE HCL 2 MG PO CAPS
4.0000 mg | ORAL_CAPSULE | ORAL | Status: DC | PRN
  Administered 2016-10-19: 4 mg via ORAL
  Filled 2016-10-19: qty 2

## 2016-10-19 MED ORDER — CLOPIDOGREL BISULFATE 75 MG PO TABS
75.0000 mg | ORAL_TABLET | Freq: Every day | ORAL | Status: DC
Start: 2016-10-19 — End: 2016-10-20
  Administered 2016-10-19 – 2016-10-20 (×2): 75 mg via ORAL
  Filled 2016-10-19 (×2): qty 1

## 2016-10-19 MED ORDER — CLOPIDOGREL BISULFATE 75 MG PO TABS: 75.0000 mg | ORAL_TABLET | Freq: Every day | ORAL | Status: DC

## 2016-10-19 NOTE — BH Assessment (Signed)
Per Dr. Darleene Cleaver and Waylan Boga, DNP, patient meets criteria for INPT treatment. TTS to seek placement.    [name] (pt's caregiver) and Dr. Rexene Edison (pt's psychologist) has the [facility] ([facility] that specializes in traumatic disorders) in [place], [place]. Per [name], the staff at [facility] are requesting discharge summaries from [facility] where the pt was hospitalized for two strokes (in May and June) to see if any deficits were found. Contacted [name] [phone] and made her aware that out of state placement is not an option at this time. She was made aware that patient's under IVC must be referred/transported to facilities within state lines only.

## 2016-10-19 NOTE — Progress Notes (Signed)
10/19/16:  1342:  LRT went to pt room to introduce self and offer activities, pt declined because she had a visitor coming.  Victorino Sparrow, LRT/CTRS

## 2016-10-19 NOTE — ED Notes (Signed)
Patient disorganized and unable to complete her thoughts while speaking. Pt referring to herself as "We" and states "I don't know how many pills We took before We came in". Pt also states "I am upset that you did not know I have diarrhea". Pt states "I want the doctor to see me before the 40 yo me comes back like yesterday. I can't speak when I am [age] again". Pt fidgety, restless and crying during conversation. Pt then apologetic for "getting so upset". This writer able to eventually talk patient into calming down and taking medication. No distress noted at this time. Pt resting in bed currently.
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
## 2016-10-19 NOTE — ED Notes (Addendum)
Pt with complaints of nausea and anxiety. MD notified, new orders received. Pt given meds as written. Pt with multiple somatic complaints and with attention-seeking behaviors.

## 2016-10-19 NOTE — ED Notes (Signed)
Patient tearful, and is requesting to have her nightly Minipress for nightmares. Corene Cornea, NP notified.

## 2016-10-19 NOTE — ED Notes (Addendum)
Pt ambulated to BR without assist, then stated she is dizzy and feels like she is going to pass out.  Pt assisted to bench in bathroom, vital signs and CBG obtained.  Pt transported back to room via wheelchair. Awake, alert & responsive.  Neg LOC. Complaint of dry mouth. Ice chips given. Dr Roxanne Mins notified.

## 2016-10-19 NOTE — ED Notes (Signed)
Miranda [name] continues to be a bit anxious.  She has been out of her room some and did take a shower.  At one point she was able to tell us that she was Miranda [name], but states she has no recall of the past couple of days.  Her friend was here and asked about getting her transferred to [place].  I explained the problem of her being under IVC and getting her transported.  I encouraged her to stay in contact and if Miranda [name] cleared up enough the psychiatrist may consider working with them.  Both were able to verbalize understanding at the time.  Miranda [name] does need to sign a release when she is able.  Will attempt after dinner.

## 2016-10-19 NOTE — ED Notes (Signed)
Pt with complaints of diarrhea and states she has had several loose stools during the day today but did not mention it to the day shift nurse. Corene Cornea, NP notified; new orders received.

## 2016-10-19 NOTE — ED Notes (Signed)
Patient awake, and ambulated to the bathroom. After pt left the bathroom, she looked at staff and states she could not walk. Staff assisted pt to her room.

## 2016-10-20 ENCOUNTER — Inpatient Hospital Stay (HOSPITAL_COMMUNITY)
Admission: AD | Admit: 2016-10-20 | Discharge: 2016-10-27 | DRG: 885 | Disposition: A | Discharge status: Other Acute Inpatient Hospital | Payer: BLUE CROSS/BLUE SHIELD | Attending: Psychiatry | Admitting: Psychiatry

## 2016-10-20 ENCOUNTER — Encounter (HOSPITAL_COMMUNITY): Payer: Self-pay

## 2016-10-20 DIAGNOSIS — E119 Type 2 diabetes mellitus without complications: Secondary | ICD-10-CM

## 2016-10-20 DIAGNOSIS — Z6841 Body Mass Index (BMI) 40.0 and over, adult: Secondary | ICD-10-CM

## 2016-10-20 DIAGNOSIS — Z87891 Personal history of nicotine dependence: Secondary | ICD-10-CM | POA: Diagnosis not present

## 2016-10-20 DIAGNOSIS — I10 Essential (primary) hypertension: Secondary | ICD-10-CM | POA: Diagnosis present

## 2016-10-20 DIAGNOSIS — Z915 Personal history of self-harm: Secondary | ICD-10-CM | POA: Diagnosis not present

## 2016-10-20 DIAGNOSIS — F4310 Post-traumatic stress disorder, unspecified: Secondary | ICD-10-CM | POA: Diagnosis present

## 2016-10-20 DIAGNOSIS — F333 Major depressive disorder, recurrent, severe with psychotic symptoms: Principal | ICD-10-CM | POA: Diagnosis present

## 2016-10-20 DIAGNOSIS — Z794 Long term (current) use of insulin: Secondary | ICD-10-CM

## 2016-10-20 DIAGNOSIS — Z9641 Presence of insulin pump (external) (internal): Secondary | ICD-10-CM | POA: Diagnosis present

## 2016-10-20 DIAGNOSIS — E11649 Type 2 diabetes mellitus with hypoglycemia without coma: Secondary | ICD-10-CM | POA: Diagnosis not present

## 2016-10-20 DIAGNOSIS — R4182 Altered mental status, unspecified: Secondary | ICD-10-CM | POA: Diagnosis present

## 2016-10-20 DIAGNOSIS — Z888 Allergy status to other drugs, medicaments and biological substances status: Secondary | ICD-10-CM | POA: Diagnosis not present

## 2016-10-20 DIAGNOSIS — F4481 Dissociative identity disorder: Secondary | ICD-10-CM | POA: Diagnosis present

## 2016-10-20 DIAGNOSIS — Z9049 Acquired absence of other specified parts of digestive tract: Secondary | ICD-10-CM | POA: Diagnosis not present

## 2016-10-20 DIAGNOSIS — G4700 Insomnia, unspecified: Secondary | ICD-10-CM | POA: Diagnosis present

## 2016-10-20 DIAGNOSIS — Z9889 Other specified postprocedural states: Secondary | ICD-10-CM | POA: Diagnosis not present

## 2016-10-20 DIAGNOSIS — Z880 Allergy status to penicillin: Secondary | ICD-10-CM | POA: Diagnosis not present

## 2016-10-20 DIAGNOSIS — Z8673 Personal history of transient ischemic attack (TIA), and cerebral infarction without residual deficits: Secondary | ICD-10-CM

## 2016-10-20 DIAGNOSIS — Z79899 Other long term (current) drug therapy: Secondary | ICD-10-CM | POA: Diagnosis not present

## 2016-10-20 LAB — GLUCOSE, CAPILLARY
GLUCOSE-CAPILLARY: 163 mg/dL — AB (ref 65–99)
GLUCOSE-CAPILLARY: 117 mg/dL — AB (ref 65–99)

## 2016-10-20 LAB — CBG MONITORING, ED: GLUCOSE-CAPILLARY: 156 mg/dL — AB (ref 65–99)

## 2016-10-20 MED ORDER — RISPERIDONE 0.5 MG PO TABS
0.5000 mg | ORAL_TABLET | Freq: Two times a day (BID) | ORAL | Status: DC
  Administered 2016-10-20 – 2016-10-23 (×6): 0.5 mg via ORAL
  Filled 2016-10-20 (×9): qty 1

## 2016-10-20 MED ORDER — PIOGLITAZONE HCL 45 MG PO TABS
45.0000 mg | ORAL_TABLET | Freq: Every day | ORAL | Status: DC
  Administered 2016-10-21 – 2016-10-27 (×7): 45 mg via ORAL
  Filled 2016-10-20 (×9): qty 1

## 2016-10-20 MED ORDER — METOPROLOL SUCCINATE 12.5 MG HALF TABLET
12.5000 mg | ORAL_TABLET | Freq: Every day | ORAL | Status: DC
  Administered 2016-10-21 – 2016-10-27 (×7): 12.5 mg via ORAL
  Filled 2016-10-20 (×9): qty 1

## 2016-10-20 MED ORDER — PNEUMOCOCCAL VAC POLYVALENT 25 MCG/0.5ML IJ INJ
0.5000 mL | INJECTION | INTRAMUSCULAR | Status: AC
  Administered 2016-10-21: 0.5 mL via INTRAMUSCULAR

## 2016-10-20 MED ORDER — ACETAMINOPHEN 325 MG PO TABS
650.0000 mg | ORAL_TABLET | Freq: Four times a day (QID) | ORAL | Status: DC | PRN
Start: 2016-10-20 — End: 2016-10-27
  Administered 2016-10-23 – 2016-10-24 (×4): 650 mg via ORAL
  Filled 2016-10-20 (×4): qty 2

## 2016-10-20 MED ORDER — LEVOTHYROXINE SODIUM 125 MCG PO TABS
125.0000 ug | ORAL_TABLET | Freq: Every day | ORAL | Status: DC
  Administered 2016-10-21 – 2016-10-27 (×7): 125 ug via ORAL
  Filled 2016-10-20 (×11): qty 1

## 2016-10-20 MED ORDER — INFLUENZA VAC SPLIT QUAD 0.5 ML IM SUSY
0.5000 mL | PREFILLED_SYRINGE | INTRAMUSCULAR | Status: AC
  Administered 2016-10-21: 0.5 mL via INTRAMUSCULAR
  Filled 2016-10-20: qty 0.5

## 2016-10-20 MED ORDER — TOPIRAMATE 100 MG PO TABS
100.0000 mg | ORAL_TABLET | Freq: Two times a day (BID) | ORAL | Status: DC
  Administered 2016-10-20 – 2016-10-27 (×14): 100 mg via ORAL
  Filled 2016-10-20 (×20): qty 1

## 2016-10-20 MED ORDER — RISPERIDONE 0.5 MG PO TABS
0.5000 mg | ORAL_TABLET | Freq: Two times a day (BID) | ORAL | Status: DC
  Administered 2016-10-20: 0.5 mg via ORAL
  Filled 2016-10-20: qty 1

## 2016-10-20 MED ORDER — ALUM & MAG HYDROXIDE-SIMETH 200-200-20 MG/5ML PO SUSP
30.0000 mL | ORAL | Status: DC | PRN
  Administered 2016-10-21 – 2016-10-24 (×2): 30 mL via ORAL
  Filled 2016-10-20 (×2): qty 30

## 2016-10-20 MED ORDER — MAGNESIUM HYDROXIDE 400 MG/5ML PO SUSP
30.0000 mL | Freq: Every day | ORAL | Status: DC | PRN
  Administered 2016-10-25: 30 mL via ORAL
  Filled 2016-10-20: qty 30

## 2016-10-20 MED ORDER — BUPROPION HCL ER (XL) 300 MG PO TB24
300.0000 mg | ORAL_TABLET | Freq: Every day | ORAL | Status: DC
  Administered 2016-10-21 – 2016-10-27 (×7): 300 mg via ORAL
  Filled 2016-10-20 (×9): qty 1

## 2016-10-20 MED ORDER — INSULIN ASPART PROT & ASPART (70-30 MIX) 100 UNIT/ML ~~LOC~~ SUSP
70.0000 [IU] | Freq: Every day | SUBCUTANEOUS | Status: DC
  Administered 2016-10-21 – 2016-10-23 (×3): 70 [IU] via SUBCUTANEOUS

## 2016-10-20 MED ORDER — INSULIN ASPART PROT & ASPART (70-30 MIX) 100 UNIT/ML ~~LOC~~ SUSP
30.0000 [IU] | Freq: Every day | SUBCUTANEOUS | Status: DC
  Administered 2016-10-20 – 2016-10-22 (×3): 30 [IU] via SUBCUTANEOUS

## 2016-10-20 MED ORDER — PANTOPRAZOLE SODIUM 40 MG PO TBEC
40.0000 mg | DELAYED_RELEASE_TABLET | Freq: Every day | ORAL | Status: DC
  Administered 2016-10-21 – 2016-10-27 (×7): 40 mg via ORAL
  Filled 2016-10-20 (×9): qty 1

## 2016-10-20 MED ORDER — LOPERAMIDE HCL 2 MG PO CAPS
4.0000 mg | ORAL_CAPSULE | ORAL | Status: DC | PRN
  Administered 2016-10-21: 4 mg via ORAL
  Filled 2016-10-20: qty 2

## 2016-10-20 MED ORDER — METFORMIN HCL 500 MG PO TABS
1000.0000 mg | ORAL_TABLET | Freq: Two times a day (BID) | ORAL | Status: DC
  Administered 2016-10-20 – 2016-10-27 (×13): 1000 mg via ORAL
  Filled 2016-10-20 (×21): qty 2

## 2016-10-20 MED ORDER — CLOPIDOGREL BISULFATE 75 MG PO TABS
75.0000 mg | ORAL_TABLET | Freq: Every day | ORAL | Status: DC
  Administered 2016-10-21 – 2016-10-27 (×7): 75 mg via ORAL
  Filled 2016-10-20 (×9): qty 1

## 2016-10-20 MED ORDER — ROSUVASTATIN CALCIUM 20 MG PO TABS
20.0000 mg | ORAL_TABLET | Freq: Every day | ORAL | Status: DC
  Administered 2016-10-21 – 2016-10-27 (×7): 20 mg via ORAL
  Filled 2016-10-20 (×9): qty 1

## 2016-10-20 MED ORDER — ALBUTEROL SULFATE HFA 108 (90 BASE) MCG/ACT IN AERS: 2.0000 | INHALATION_SPRAY | RESPIRATORY_TRACT | Status: DC | PRN

## 2016-10-20 NOTE — Tx Team (Signed)
Initial Treatment Plan 10/20/2016 9:31 PM Miranda [name] [id number]    PATIENT STRESSORS: Health problems Marital or family conflict Traumatic event   PATIENT STRENGTHS: Average or above average intelligence Capable of independent living Communication skills General fund of knowledge Motivation for treatment/growth Supportive family/friends   PATIENT IDENTIFIED PROBLEMS: Depression  Anxiety  Dissociative Identity Disorder   Confusion-Psychosis  Suicidal ideation  "To be discharged"  "To be present long enough to get to [facility] in [place]"  "Remain not suicidal and stop switching so much."       DISCHARGE CRITERIA:  Improved stabilization in mood, thinking, and/or behavior Verbal commitment to aftercare and medication compliance  PRELIMINARY DISCHARGE PLAN: Outpatient therapy Medication management  PATIENT/FAMILY INVOLVEMENT: This treatment plan has been presented to and reviewed with the patient, Miranda [name].  The patient and family have been given the opportunity to ask questions and make suggestions.  Windell Moment, RN 10/20/2016, 9:31 PM

## 2016-10-20 NOTE — BH Assessment (Signed)
Patient has been accepted to [facility] 504-2 at 13:30. Patient awaiting sheriff transport.

## 2016-10-20 NOTE — Progress Notes (Signed)
10/20/16 1332:  LRT went to pt room to offer activities, pt was sleep.  Victorino Sparrow, LRT/CTRS

## 2016-10-20 NOTE — ED Notes (Signed)
[employer] called for transport to [facility].

## 2016-10-20 NOTE — Progress Notes (Signed)
Report received from admitting nurse. Patient denies SI/HI/AVH and pain at this time. Orders received and acknowledged.  Medications administered as per order. Patient verbally contracts for safety on the unit and agrees to come to staff before acting on any self harm thoughts/feelings and other needs or concerns. 15 min checks initiated for safety and patient oriented to the unit and the unit schedule. 

## 2016-10-20 NOTE — ED Notes (Signed)
Per Sharyn Lull patient was upset last night finally got to sleep let rest

## 2016-10-20 NOTE — BH Assessment (Signed)
[facility] Assessment Progress Note Patient was seen this date by this writer to assess treatment progress. Patient is oriented to time/place but is very disorganized and states "it is hard to remember anything." This writer attempts to redirect patient unsuccessfully. Patient is slow to respond to this writer's questions and seems not to process the content of this writer's questions. Case was staffed with Akintayo MD who recommended continued inpatient treatment.

## 2016-10-20 NOTE — Progress Notes (Signed)
Miranda [name] is a 40 year old female being admitted involuntarily to [facility] from [facility].  She was IVC'd by her roommate for altered mental status.  She was making suicidal statements, not speaking, leaving the house and wandering to [facility].  Silver alert was placed and she was found by the police.  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 Rexene Edison (psychologist).  She is trying to get placed in a special hospital in [place] called [facility].  She currently denies SI/HI or A/V hallucinations.  "I am feeling a little better today."  She had some thought blocking and confusion at times but was able to answer questions appropriate during [facility] admission.  She reported multiple medical problems.  She has had two strokes this year and was hospitalized at [facility] for this.  She has diabetes and a thyroid disorder.  She denies any pain of discomfort and appears to be in no physical distress.  Oriented her to the unit.  Admission paperwork completed and signed.  Skin assessment completed and no skin issues noted.  Belongings searched and secured in locker 28.  Q 15 minute checks initiated for safety.  We will monitor the progress towards her goals.

## 2016-10-21 DIAGNOSIS — Z794 Long term (current) use of insulin: Secondary | ICD-10-CM

## 2016-10-21 DIAGNOSIS — Z880 Allergy status to penicillin: Secondary | ICD-10-CM

## 2016-10-21 DIAGNOSIS — Z79899 Other long term (current) drug therapy: Secondary | ICD-10-CM

## 2016-10-21 DIAGNOSIS — Z888 Allergy status to other drugs, medicaments and biological substances status: Secondary | ICD-10-CM

## 2016-10-21 DIAGNOSIS — Z9889 Other specified postprocedural states: Secondary | ICD-10-CM

## 2016-10-21 DIAGNOSIS — F333 Major depressive disorder, recurrent, severe with psychotic symptoms: Principal | ICD-10-CM

## 2016-10-21 DIAGNOSIS — Z91018 Allergy to other foods: Secondary | ICD-10-CM

## 2016-10-21 LAB — GLUCOSE, CAPILLARY
Glucose-Capillary: 96 mg/dL (ref 65–99)
Glucose-Capillary: 112 mg/dL — ABNORMAL HIGH (ref 65–99)
Glucose-Capillary: 65 mg/dL (ref 65–99)
Glucose-Capillary: 87 mg/dL (ref 65–99)
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
## 2016-10-21 NOTE — Progress Notes (Signed)
DAR NOTE: Patient presents with anxious affect and  mood.  Denies pain, auditory and visual hallucinations.  Patient observed responding to internal stimuli.    Maintained on routine safety checks.  Medications given as prescribed.  Support and encouragement offered as needed.  Attended group and participated.  Patient was visible in milieu for activity and therapy.  No signs and symptoms of hypoglycemia noted.  Offered no complaint.

## 2016-10-21 NOTE — BHH Counselor (Signed)
CSW met with pt at this time, to attempt to complete the PSA.  Pt states that she goes by Miranda [name], right now".  Pt states that she doesn't know what led to her coming to the hospital.  Pt states that she feels exhausted today.  Pt appears to be thought blocking and has a hard time answering any questions without a long pause before answering.  Pt states that she lives in a house with 3 other roommates, [place], [name] and [name].  Pt states that she and [name] have been friends for 17 years.  Pt states that she is currently unemployed, as she is an [employer] and it is unethical for her to practice right now.  Pt states that she was married before, and divorced, with no kids, but this is a sore subject.  Pt states that she has Dissociative Identity Disorder and sees Dr. Rexene Edison for therapy.  Pt states that she is interested in transferring to [facility] treatment center in [place], [place], that specializes in trauma.  Pt unable to answer any further questions as she appeared distraught and upset by the questions asked above.  CSW to attempt to assess at a later time.    Theressa Millard, [place] 10/21/2016  11:03 AM
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
## 2016-10-21 NOTE — [facility] Group Notes (Signed)
[place] Group Notes:  (Clinical Social Work)  10/21/2016  1:15-2:00PM  Summary of Progress/Problems:   Today's process group involved patients discussing their feelings related to being hospitalized over the holiday season.  They were given the opportunity to share any memories, negative or positive, about holidays past.  The patient expressed a great deal of confusion throughout group, spoke haltingly.  She stated she is not necessarily glad to be in the hospital, would like to say she would like to be at home but does not feel safe at home.  She would like to be at home with her roommates if she would be safe.  She stated she cannot remember her traditions, knows that she has nothing to do with any family members "for good reason" and thinks she is in this shape because she went "weeks and weeks" without sleep.  She talked about having an identity disorder.  She stated that the thing that could help her the most in the next day or two soul be to have "familiarity" by talking with her therapist Dr. Rexene Edison, and to get into the [employer] facility for identity disorders in [place].  Type of Therapy:  Group Therapy - Process  Participation Level:  Active  Participation Quality:  Attentive and Sharing  Affect:  Depressed, Flat, Irritable and Tearful  Cognitive:  Disorganized and Confused  Insight:  Limited  Engagement in Therapy:  Limited  Modes of Intervention:  Exploration, Discussion  Selmer Dominion, LCSW 10/21/2016, 2:17 PM

## 2016-10-21 NOTE — Progress Notes (Signed)
Hypoglycemic Event  CBG: 65  Treatment: 15 GM carbohydrate snack  Symptoms: None  Follow-up CBG: Time:2134 CBG Result:87   Possible Reasons for Event: Inadequate meal intake  Comments/MD notified:NP notified at 2136. Pt is paranoid; took Pt awhile to eat snack.    Leia Alf

## 2016-10-21 NOTE — Progress Notes (Signed)
DAR Note  Pt at the time of assessment denied depression, anxiety, SI, HI or AVH only by shaking her head from side to side when asked about each. Pt is however, flat and withdrawn to room; remained in bed with eyes closed all evening. Pt does not look to be in any acute distress. Support, encouragement, and safe environment provided.  15-minute safety checks continue.

## 2016-10-22 LAB — GLUCOSE, CAPILLARY
GLUCOSE-CAPILLARY: 137 mg/dL — AB (ref 65–99)
GLUCOSE-CAPILLARY: 93 mg/dL (ref 65–99)
GLUCOSE-CAPILLARY: 103 mg/dL — AB (ref 65–99)
Glucose-Capillary: 104 mg/dL — ABNORMAL HIGH (ref 65–99)
Glucose-Capillary: 48 mg/dL — ABNORMAL LOW (ref 65–99)
Glucose-Capillary: 58 mg/dL — ABNORMAL LOW (ref 65–99)
Glucose-Capillary: 74 mg/dL (ref 65–99)
Glucose-Capillary: 108 mg/dL — ABNORMAL HIGH (ref 65–99)

## 2016-10-22 MED ORDER — ONDANSETRON 4 MG PO TBDP
4.0000 mg | ORAL_TABLET | Freq: Four times a day (QID) | ORAL | Status: DC | PRN
  Administered 2016-10-22 – 2016-10-24 (×3): 4 mg via ORAL
  Filled 2016-10-22 (×3): qty 1

## 2016-10-22 MED ORDER — ONDANSETRON 4 MG PO TBDP
ORAL_TABLET | ORAL | Status: AC
  Administered 2016-10-22: 13:00:00
  Filled 2016-10-22: qty 1

## 2016-10-22 MED ORDER — TRAZODONE HCL 50 MG PO TABS
50.0000 mg | ORAL_TABLET | Freq: Every evening | ORAL | Status: AC | PRN
  Administered 2016-10-22 (×2): 50 mg via ORAL
  Filled 2016-10-22 (×4): qty 1

## 2016-10-22 NOTE — [facility] Group Notes (Signed)
Adult Therapy Group Note  Date:  10/22/2016 Time: 11:00-11:30AM  Group Topic/Focus:  In group today we briefly listened to music that patients requested, and they shared what each song meant to them.   Participation Level:  Active  Participation Quality:  Attentive and Sharing  Affect:  Blunted  Cognitive:  Disorganized and Confused  Insight: Limited  Engagement in Group:  Limited  Modes of Intervention:  Activity and Exploration  Additional Comments:  Pt did not talk or react during group.   Miranda [name] 10/22/2016, 1:45 PM

## 2016-10-22 NOTE — Progress Notes (Addendum)
Hypoglycemic Event 1151  CBG: 58  Treatment: 15 GM carbohydrate snack- 4 oz cranberry juice  Symptoms: None  Follow-up CBG: Time:1216 CBG Result:48  Treatment: another 15 GM carbohydrate snack- 4 oz cranberry juice- meal in front of her.  Follow-up CBG: Time:1233 CBG Result:74  Possible Reasons for Event: Medication regimen: takes 70 units 70/30 in AM.  Provider notified.  States she ate 100% of breakfast (eggs, french toast, milk.)   Comments/MD notified:MD notified.  Ate 50% of meal plus 30g total extra carbohydrates for hypoglycemia.

## 2016-10-22 NOTE — Progress Notes (Signed)
The focus of this group is to help patients review their daily goal of treatment and discuss progress on daily workbooks. Pt attended the evening group but responded minimally to discussion prompts from the [place]. Pt shared that today was an "okay" day, the highlight of which was a visit from her roommate during evening visitation.  Pt rated her day a 3 out of 10. Her affect was flat and depressed. She was slow to respond to discussion prompts and did not make eye contact when speaking/being spoken to.

## 2016-10-22 NOTE — Progress Notes (Signed)
D: Initially pt wouldn't talk to, or look at the writer. When asked if she was still experiencing hallucinations pt stated, "If you call flashbacks or nightmares seeing things,  then I guess I am." When asked if could explain the flashback pt stated, the death of her son 29 yrs ago.  Pt has no other questions or concerns.   A:  Support and encouragement was offered. 15 min checks continued for safety.  R: Pt remains safe.

## 2016-10-22 NOTE — Progress Notes (Signed)
[facility] MD Progress Note  10/22/2016 9:35 AM [name]  MRN:  [id number] Subjective:  Patient reports " Today, I am called Miranda [name], and I am not feeling great."  Objective:Miranda [name] is awake, alert and oriented. Seen resting in bed staring at the wall.  Denies suicidal or homicidal ideation during this assessment. Denies auditory or visual hallucination, however dose appear to be responding to internal stimuli. Patient reports she is medication compliant without mediation side effects. Reports she is unsure how helpful the medication are, cause she is still feeling sad. Patient she is having memory loss and her life appears fragmented. States she was resting well throughout the night. Support, encouragement and reassurance was provided.   Principal Problem: Major depressive disorder, recurrent episode, severe, with psychosis ([place]) Diagnosis:   Patient Active Problem List   Diagnosis Date Noted  . Major depressive disorder, recurrent episode, severe, with psychosis ([place]) [F33.3] 10/20/2016  . Major depressive disorder, recurrent episode, severe, 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[place]) [F33.3] 10/19/2016  . TIA (transient ischemic attack) [G45.9] 05/16/2016  . Arterial ischemic stroke, MCA, left, acute ([place]) [I63.512] 03/29/2016  . Plantar fasciitis [M72.2] 08/08/2013  . Obesity, Class III, BMI 40-49.9 (morbid obesity) ([place]) [E66.01] 04/09/2013  . Diabetes ([place]) [E11.9] 03/31/2013  . Unspecified sleep apnea [G47.30] 03/31/2013  . PCO (polycystic ovaries) [E28.2] 04/11/2012  . NECK PAIN [M54.2] 11/21/2010  . FATTY LIVER DISEASE [K76.89] 01/26/2010  . NAUSEA WITH VOMITING [R11.2] 01/26/2010  . ABDOMINAL PAIN, EPIGASTRIC [R10.13] 01/26/2010  . PORTAL HYPERTENSION [K76.6] 01/26/2010  . ABDOMINAL PAIN, RIGHT UPPER QUADRANT [R10.11] 12/27/2009  . Migraine headache [G43.909] 06/28/2009  . ACUTE BRONCHITIS [J20.9] 06/08/2009  . ABDOMINAL PAIN, RIGHT LOWER QUADRANT [R10.31] 11/24/2008  . BREAST  MASS [N63.0] 08/21/2008  . KNEE PAIN [M25.569] 04/14/2008  . Hypothyroidism [E03.9] 03/17/2008  . Hyperlipidemia [E78.5] 03/17/2008  . DEPRESSION [F32.9] 03/17/2008  . NEUROPATHY [G58.9] 03/17/2008  . Essential hypertension [I10] 03/17/2008  . ASTHMA [J45.909] 03/17/2008  . GERD [K21.9] 03/17/2008  . Headache [R51] 03/17/2008   Total Time spent with patient: 30 minutes  Past Psychiatric History:  Past Medical History:  Past Medical History:  Diagnosis Date  . Anxiety   . Arthritis   . Asthma   . Bowel obstruction   . Cardiac arrhythmia   . Depression    sees Dr. Matilde Haymaker in [place]   . Diabetes mellitus    type 2, on insulin pump, sees Dr. Loanne Drilling   . GERD (gastroesophageal reflux disease)   . Hyperlipidemia   . Hypertension   . Hypothyroidism    sees Dr. Elyse Hsu  . Insomnia   . Migraine syndrome   . Morbid obesity ([place])   . Multiple personality   . Neck pain   . Neuropathy associated with endocrine disorder ([place])   . Sleep apnea with use of continuous positive airway pressure (CPAP)    on CPAP  . Stroke [facility]) 03-25-16   left MCA   . Stroke [facility]) 04/2016    Past Surgical History:  Procedure Laterality Date  . blocked itestinal repair  47   age 9 months  . CHOLECYSTECTOMY  2011  . INDUCED ABORTION  1996   forced abortion  . INGUINAL HERNIA REPAIR Left 1980   age 73  . LAPAROSCOPIC ENDOMETRIOSIS FULGURATION  1998  . WISDOM TOOTH EXTRACTION  2007   Family History:  Family History  Problem Relation Age of Onset  . Adopted: Yes  . Heart disease  on both sides of family  . Diabetes      on both sides of family   Family Psychiatric  History:  Social History:  History  Alcohol Use No     History  Drug Use No    Social History   Social History  . Marital status: Married    Spouse name: N/A  . Number of children: 5  . Years of education: N/A   Occupational History  . Nurse, learning disability    Social History Main Topics   . Smoking status: Former Smoker    Packs/day: 0.00    Types: Cigarettes  . Smokeless tobacco: Never Used     Comment: quit 03/25/2016  . Alcohol use No  . Drug use: No  . Sexual activity: Yes   Other Topics Concern  . None   Social History Narrative  . None   Additional Social History:    Pain Medications: See MAR Prescriptions: See MAR Over the Counter: See MAR History of alcohol / drug use?: Yes Name of Substance 1: Cigarettes 1 - Age of First Use: UTA 1 - Amount (size/oz): Pt cargiver, it depends on the pt's current personality.  1 - Frequency: UTA 1 - Duration: UTA 1 - Last Use / Amount: UTA                  Sleep: Fair  Appetite:  Fair  Current Medications: Current Facility-Administered Medications  Medication Dose Route Frequency Provider Last Rate Last Dose  . acetaminophen (TYLENOL) tablet 650 mg  650 mg Oral Q6H PRN Patrecia Pour, NP      . albuterol (PROVENTIL HFA;VENTOLIN HFA) 108 (90 Base) MCG/ACT inhaler 2 puff  2 puff Inhalation Q4H PRN Patrecia Pour, NP      . alum & mag hydroxide-simeth (MAALOX/MYLANTA) 200-200-20 MG/5ML suspension 30 mL  30 mL Oral Q4H PRN Patrecia Pour, NP   30 mL at 10/21/16 2248  . buPROPion (WELLBUTRIN XL) 24 hr tablet 300 mg  300 mg Oral Daily Patrecia Pour, NP   300 mg at 10/22/16 0820  . clopidogrel (PLAVIX) tablet 75 mg  75 mg Oral Daily Patrecia Pour, NP   75 mg at 10/22/16 0820  . insulin aspart protamine- aspart (NOVOLOG MIX 70/30) injection 30 Units  30 Units Subcutaneous Q supper Patrecia Pour, NP   30 Units at 10/21/16 1640  . insulin aspart protamine- aspart (NOVOLOG MIX 70/30) injection 70 Units  70 Units Subcutaneous Q breakfast Patrecia Pour, NP   70 Units at 10/22/16 [id number]  . levothyroxine (SYNTHROID, LEVOTHROID) tablet 125 mcg  125 mcg Oral QAC breakfast Patrecia Pour, NP   125 mcg at 10/22/16 [id number]  . loperamide (IMODIUM) capsule 4 mg  4 mg Oral PRN Patrecia Pour, NP   4 mg at 10/21/16 0849  . magnesium  hydroxide (MILK OF MAGNESIA) suspension 30 mL  30 mL Oral Daily PRN Patrecia Pour, NP      . metFORMIN (GLUCOPHAGE) tablet 1,000 mg  1,000 mg Oral BID WC Patrecia Pour, NP   1,000 mg at 10/22/16 0820  . metoprolol succinate (TOPROL-XL) 24 hr tablet 12.5 mg  12.5 mg Oral Daily Patrecia Pour, NP   12.5 mg at 10/22/16 0819  . pantoprazole (PROTONIX) EC tablet 40 mg  40 mg Oral Daily Patrecia Pour, NP   40 mg at 10/22/16 0820  . pioglitazone (ACTOS) tablet 45 mg  45 mg Oral  Daily Patrecia Pour, NP   45 mg at 10/22/16 0820  . risperiDONE (RISPERDAL) tablet 0.5 mg  0.5 mg Oral BID Patrecia Pour, NP   0.5 mg at 10/22/16 0820  . rosuvastatin (CRESTOR) tablet 20 mg  20 mg Oral Daily Patrecia Pour, NP   20 mg at 10/22/16 0820  . topiramate (TOPAMAX) tablet 100 mg  100 mg Oral BID Patrecia Pour, NP   100 mg at 10/22/16 [id number]    Lab Results:  Results for orders placed or performed during the hospital encounter of 10/20/16 (from the past 48 hour(s))  Glucose, capillary     Status: Abnormal   Collection Time: 10/20/16  5:13 PM  Result Value Ref Range   Glucose-Capillary 163 (H) 65 - 99 mg/dL   Comment 1 Notify RN    Comment 2 Document in Chart   Glucose, capillary     Status: Abnormal   Collection Time: 10/20/16  8:23 PM  Result Value Ref Range   Glucose-Capillary 117 (H) 65 - 99 mg/dL  Glucose, capillary     Status: None   Collection Time: 10/21/16  6:39 AM  Result Value Ref Range   Glucose-Capillary 96 65 - 99 mg/dL  Glucose, capillary     Status: Abnormal   Collection Time: 10/21/16  4:50 PM  Result Value Ref Range   Glucose-Capillary 112 (H) 65 - 99 mg/dL  Glucose, capillary     Status: None   Collection Time: 10/21/16  8:42 PM  Result Value Ref Range   Glucose-Capillary 65 65 - 99 mg/dL  Glucose, capillary     Status: None   Collection Time: 10/21/16  9:34 PM  Result Value Ref Range   Glucose-Capillary 87 65 - 99 mg/dL  Glucose, capillary     Status: Abnormal   Collection Time:  10/22/16  6:12 AM  Result Value Ref Range   Glucose-Capillary 104 (H) 65 - 99 mg/dL   Comment 1 Notify RN     Blood Alcohol level:  Lab Results  Component Value Date   ETH <5 [id number]    Metabolic Disorder Labs: Lab Results  Component Value Date   HGBA1C 9.9 08/05/2015   No results found for: PROLACTIN Lab Results  Component Value Date   CHOL 85 04/28/2013   TRIG 115.0 04/28/2013   HDL 37.50 (L) 04/28/2013   CHOLHDL 2 04/28/2013   VLDL 23.0 04/28/2013   LDLCALC 25 04/28/2013   LDLCALC 32 04/11/2012    Physical Findings: AIMS: Facial and Oral Movements Muscles of Facial Expression: None, normal Lips and Perioral Area: None, normal Jaw: None, normal Tongue: None, normal,Extremity Movements Upper (arms, wrists, hands, fingers): None, normal Lower (legs, knees, ankles, toes): None, normal, Trunk Movements Neck, shoulders, hips: None, normal, Overall Severity Severity of abnormal movements (highest score from questions above): None, normal Incapacitation due to abnormal movements: None, normal Patient's awareness of abnormal movements (rate only patient's report): No Awareness, Dental Status Current problems with teeth and/or dentures?: No Does patient usually wear dentures?: No  CIWA:    COWS:     Musculoskeletal: Strength & Muscle Tone: within normal limits Gait & Station: normal Patient leans: N/A  Psychiatric Specialty Exam: Physical Exam  Nursing note and vitals reviewed. Constitutional: She is oriented to person, place, and time. She appears well-developed.  Cardiovascular: Regular rhythm.   Neurological: She is alert and oriented to person, place, and time.  Psychiatric: She has a normal mood and affect. Her behavior is normal.  Review of Systems  Psychiatric/Behavioral: Positive for depression. The patient is nervous/anxious and has insomnia.     Blood pressure 107/82, pulse 100, temperature 98.7 F (37.1 C), resp. rate 16, height 5\' 1"  (1.549 m),  weight 115.4 kg (254 lb 8 oz), SpO2 99 %.Body mass index is 48.09 kg/m.  General Appearance: Disheveled and Guarded  Eye Contact:  Minimal  Speech:  Clear and Coherent  Volume:  Normal  Mood:  Dysphoric and Irritable  Affect:  Constricted, Depressed and Flat  Thought Process:  Disorganized  Orientation:  Full (Time, Place, and Person)  Thought Content:  Hallucinations: Auditory and Paranoid Ideation  Suicidal Thoughts:  No  Homicidal Thoughts:  No  Memory:  Immediate;   Fair Recent;   Fair Remote;   Fair  Judgement:  Fair  Insight:  Present and Shallow  Psychomotor Activity:  Normal  Concentration:  Concentration: Fair  Recall:  [employer] of Knowledge:  Fair  Language:  Fair  Akathisia:  No  Handed:  Right  AIMS (if indicated):     Assets:  Communication Skills Desire for Improvement Resilience  ADL's:  Intact  Cognition:  WNL  Sleep:  Number of Hours: 5.25     I agree with current treatment plan on 10/22/2016, Patient seen face-to-face for psychiatric evaluation follow-up, chart reviewed. Reviewed the information documented and agree with the treatment plan.  Treatment Plan Summary: Daily contact with patient to assess and evaluate symptoms and progress in treatment and Medication management  Continue with Risperdal 0.5mg , Wellbutrin 300 mg for mood stabilization. Continue with Trazodone 100 mg for insomnia Will continue to monitor vitals ,medication compliance and treatment side effects while patient is here.  Reviewed labs: BAL -, UDS - . CSW will start working on disposition.  Patient to participate in therapeutic milieu   Derrill [place], NP 10/22/2016, 9:35 AM   Agree with NP progress note as above
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
## 2016-10-23 LAB — GLUCOSE, CAPILLARY
GLUCOSE-CAPILLARY: 88 mg/dL (ref 65–99)
Glucose-Capillary: 98 mg/dL (ref 65–99)
Glucose-Capillary: 44 mg/dL — CL (ref 65–99)
Glucose-Capillary: 63 mg/dL — ABNORMAL LOW (ref 65–99)
Glucose-Capillary: 78 mg/dL (ref 65–99)
Glucose-Capillary: 131 mg/dL — ABNORMAL HIGH (ref 65–99)

## 2016-10-23 MED ORDER — INSULIN ASPART PROT & ASPART (70-30 MIX) 100 UNIT/ML ~~LOC~~ SUSP
25.0000 [IU] | Freq: Every day | SUBCUTANEOUS | Status: DC
Start: 2016-10-23 — End: 2016-10-25
  Administered 2016-10-23: 25 [IU] via SUBCUTANEOUS

## 2016-10-23 MED ORDER — TRAZODONE HCL 50 MG PO TABS
50.0000 mg | ORAL_TABLET | Freq: Every evening | ORAL | Status: DC | PRN
  Administered 2016-10-24: 50 mg via ORAL
  Filled 2016-10-23: qty 1

## 2016-10-23 MED ORDER — GLUCOSE 4 G PO CHEW
CHEWABLE_TABLET | ORAL | Status: AC
  Administered 2016-10-23: 1
  Filled 2016-10-23: qty 1

## 2016-10-23 MED ORDER — RISPERIDONE 1 MG PO TABS
1.0000 mg | ORAL_TABLET | Freq: Two times a day (BID) | ORAL | Status: DC
  Administered 2016-10-23 – 2016-10-25 (×4): 1 mg via ORAL
  Filled 2016-10-23 (×6): qty 1

## 2016-10-23 MED ORDER — INSULIN ASPART PROT & ASPART (70-30 MIX) 100 UNIT/ML ~~LOC~~ SUSP
65.0000 [IU] | Freq: Every day | SUBCUTANEOUS | Status: DC
  Administered 2016-10-24 – 2016-10-25 (×2): 65 [IU] via SUBCUTANEOUS

## 2016-10-23 NOTE — Progress Notes (Signed)
Pt's 1200 POC CBG taken and results were 44. Pt asymptomatic denying s/s hypoglycemia such as weakness, hunger, diaphoresis, weakness or confusion. Per protocol ordered and given 1 glucose tablet po @ 1211. Rechecked CBG @ 1226 with results of 66. MD [place] notified. Pt consumed less than 50% of lunch and at 1315 pt's CBG was 78. Pt remains asymptomatic. Alert and responsive with a depressed and sad affect.

## 2016-10-23 NOTE — Progress Notes (Signed)
D: Stayed in bed for most of the day. Dd not go to the cafeteria for meals and was self isolative. Visited by family without incident. Low blood sugar noted but pt remains asymptomatic. Denies SI/HI/AVH/Pain.  A: Safety checks maintained. Continued to monitor pt for symptoms of hypo/hyperglycemia.  R: Pt contracted for safety.

## 2016-10-23 NOTE — Plan of Care (Signed)
Problem: Coping: Goal: Ability to verbalize feelings will improve Outcome: Progressing Encouraged pt to make eye contact and make needs known to staff.

## 2016-10-23 NOTE — Progress Notes (Signed)
[facility] MD Progress Note  10/23/2016 1:54 PM AVANGELINA FLIGHT  MRN:  [id number] Subjective:  Patient reports feeling " so -so". She states she feels she has continued to have frequent " switches " in personalities since her admission , but at this time reports she is herself - refers to herself as " Mandy".  Does not endorse medication side effects. Denies suicidal ideations at this time   Objective: I have discussed case with treatment team and have met with patient . Patient currently in bed, presents fairly related- fair eye contact, uses mono-syllablses or short answers to questions. She denies suicidal ideations. At this time denies any medication side effects. As above, reports history of having multiple personalities , and states she has continued to have frequent switches. She states that when she does not answer questions or interact with anyone, it may mean her personality has switched to " Adam", whom she states does not interact . Limited milieu participation, going to some groups, labile in groups . Of note, patient had episode of hypoglycemia earlier today.   Principal Problem: Major depressive disorder, recurrent episode, severe, with psychosis ([employer]) Diagnosis:   Patient Active Problem List   Diagnosis Date Noted  . Major depressive disorder, recurrent episode, severe, with psychosis ([place]) [F33.3] 10/20/2016  . Major depressive disorder, recurrent episode, severe, 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[place]) [F33.3] 10/19/2016  . TIA (transient ischemic attack) [G45.9] 05/16/2016  . Arterial ischemic stroke, MCA, left, acute ([place]) [I63.512] 03/29/2016  . Plantar fasciitis [M72.2] 08/08/2013  . Obesity, Class III, BMI 40-49.9 (morbid obesity) ([place]) [E66.01] 04/09/2013  . Diabetes ([place]) [E11.9] 03/31/2013  . Unspecified sleep apnea [G47.30] 03/31/2013  . PCO (polycystic ovaries) [E28.2] 04/11/2012  . NECK PAIN [M54.2] 11/21/2010  . FATTY LIVER DISEASE [K76.89] 01/26/2010  . NAUSEA WITH  VOMITING [R11.2] 01/26/2010  . ABDOMINAL PAIN, EPIGASTRIC [R10.13] 01/26/2010  . PORTAL HYPERTENSION [K76.6] 01/26/2010  . ABDOMINAL PAIN, RIGHT UPPER QUADRANT [R10.11] 12/27/2009  . Migraine headache [G43.909] 06/28/2009  . ACUTE BRONCHITIS [J20.9] 06/08/2009  . ABDOMINAL PAIN, RIGHT LOWER QUADRANT [R10.31] 11/24/2008  . BREAST MASS [N63.0] 08/21/2008  . KNEE PAIN [M25.569] 04/14/2008  . Hypothyroidism [E03.9] 03/17/2008  . Hyperlipidemia [E78.5] 03/17/2008  . DEPRESSION [F32.9] 03/17/2008  . NEUROPATHY [G58.9] 03/17/2008  . Essential hypertension [I10] 03/17/2008  . ASTHMA [J45.909] 03/17/2008  . GERD [K21.9] 03/17/2008  . Headache [R51] 03/17/2008   Total Time spent with patient:  20 minutes   Past Psychiatric History:  Past Medical History:  Past Medical History:  Diagnosis Date  . Anxiety   . Arthritis   . Asthma   . Bowel obstruction   . Cardiac arrhythmia   . Depression    sees Dr. Matilde Haymaker in [place]   . Diabetes mellitus    type 2, on insulin pump, sees Dr. Loanne Drilling   . GERD (gastroesophageal reflux disease)   . Hyperlipidemia   . Hypertension   . Hypothyroidism    sees Dr. Elyse Hsu  . Insomnia   . Migraine syndrome   . Morbid obesity ([place])   . Multiple personality   . Neck pain   . Neuropathy associated with endocrine disorder ([place])   . Sleep apnea with use of continuous positive airway pressure (CPAP)    on CPAP  . [facility]roke [facility]) 03-25-16   left MCA   . Stroke [facility]) 04/2016    Past Surgical History:  Procedure Laterality Date  . blocked itestinal repair  68   age 48 months  .  CHOLECYSTECTOMY  2011  . INDUCED ABORTION  1996   forced abortion  . INGUINAL HERNIA REPAIR Left 1980   age 3  . LAPAROSCOPIC ENDOMETRIOSIS FULGURATION  1998  . WISDOM TOOTH EXTRACTION  2007   Family History:  Family History  Problem Relation Age of Onset  . Adopted: Yes  . Heart disease      on both sides of family  . Diabetes      on both sides of family    Family Psychiatric  History:  Social History:  History  Alcohol Use No     History  Drug Use No    Social History   Social History  . Marital status: Married    Spouse name: N/A  . Number of children: 5  . Years of education: N/A   Occupational History  . Nurse, learning disability    Social History Main Topics  . Smoking status: Former Smoker    Packs/day: 0.00    Types: Cigarettes  . Smokeless tobacco: Never Used     Comment: quit 03/25/2016  . Alcohol use No  . Drug use: No  . Sexual activity: Yes   Other Topics Concern  . None   Social History Narrative  . None   Additional Social History:    Pain Medications: See MAR Prescriptions: See MAR Over the Counter: See MAR History of alcohol / drug use?: Yes Name of Substance 1: Cigarettes 1 - Age of First Use: UTA 1 - Amount (size/oz): Pt cargiver, it depends on the pt's current personality.  1 - Frequency: UTA 1 - Duration: UTA 1 - Last Use / Amount: UTA  Sleep: Fair  Appetite:  Fair  Current Medications: Current Facility-Administered Medications  Medication Dose Route Frequency Provider Last Rate Last Dose  . acetaminophen (TYLENOL) tablet 650 mg  650 mg Oral Q6H PRN Patrecia Pour, NP      . albuterol (PROVENTIL HFA;VENTOLIN HFA) 108 (90 Base) MCG/ACT inhaler 2 puff  2 puff Inhalation Q4H PRN Patrecia Pour, NP      . alum & mag hydroxide-simeth (MAALOX/MYLANTA) 200-200-20 MG/5ML suspension 30 mL  30 mL Oral Q4H PRN Patrecia Pour, NP   30 mL at 10/21/16 2248  . buPROPion (WELLBUTRIN XL) 24 hr tablet 300 mg  300 mg Oral Daily Patrecia Pour, NP   300 mg at 10/23/16 4680  . clopidogrel (PLAVIX) tablet 75 mg  75 mg Oral Daily Patrecia Pour, NP   75 mg at 10/23/16 3212  . insulin aspart protamine- aspart (NOVOLOG MIX 70/30) injection 30 Units  30 Units Subcutaneous Q supper Patrecia Pour, NP   30 Units at 10/22/16 1728  . insulin aspart protamine- aspart (NOVOLOG MIX 70/30) injection 70 Units  70  Units Subcutaneous Q breakfast Patrecia Pour, NP   70 Units at 10/23/16 2482  . levothyroxine (SYNTHROID, LEVOTHROID) tablet 125 mcg  125 mcg Oral QAC breakfast Patrecia Pour, NP   125 mcg at 10/23/16 0645  . loperamide (IMODIUM) capsule 4 mg  4 mg Oral PRN Patrecia Pour, NP   4 mg at 10/21/16 0849  . magnesium hydroxide (MILK OF MAGNESIA) suspension 30 mL  30 mL Oral Daily PRN Patrecia Pour, NP      . metFORMIN (GLUCOPHAGE) tablet 1,000 mg  1,000 mg Oral BID WC Patrecia Pour, NP   1,000 mg at 10/23/16 0802  . metoprolol succinate (TOPROL-XL) 24 hr tablet 12.5 mg  12.5 mg  Oral Daily Patrecia Pour, NP   12.5 mg at 10/23/16 0801  . ondansetron (ZOFRAN-ODT) disintegrating tablet 4 mg  4 mg Oral Q6H PRN Encarnacion Slates, NP   4 mg at 10/22/16 1758  . pantoprazole (PROTONIX) EC tablet 40 mg  40 mg Oral Daily Patrecia Pour, NP   40 mg at 10/23/16 0802  . pioglitazone (ACTOS) tablet 45 mg  45 mg Oral Daily Patrecia Pour, NP   45 mg at 10/23/16 0802  . risperiDONE (RISPERDAL) tablet 0.5 mg  0.5 mg Oral BID Patrecia Pour, NP   0.5 mg at 10/23/16 3536  . rosuvastatin (CRESTOR) tablet 20 mg  20 mg Oral Daily Patrecia Pour, NP   20 mg at 10/23/16 0802  . topiramate (TOPAMAX) tablet 100 mg  100 mg Oral BID Patrecia Pour, NP   100 mg at 10/23/16 1443    Lab Results:  Results for orders placed or performed during the hospital encounter of 10/20/16 (from the past 48 hour(s))  Glucose, capillary     Status: Abnormal   Collection Time: 10/21/16  4:50 PM  Result Value Ref Range   Glucose-Capillary 112 (H) 65 - 99 mg/dL  Glucose, capillary     Status: None   Collection Time: 10/21/16  8:42 PM  Result Value Ref Range   Glucose-Capillary 65 65 - 99 mg/dL  Glucose, capillary     Status: None   Collection Time: 10/21/16  9:34 PM  Result Value Ref Range   Glucose-Capillary 87 65 - 99 mg/dL  Glucose, capillary     Status: Abnormal   Collection Time: 10/22/16  6:12 AM  Result Value Ref Range    Glucose-Capillary 104 (H) 65 - 99 mg/dL   Comment 1 Notify RN   Glucose, capillary     Status: Abnormal   Collection Time: 10/22/16 11:51 AM  Result Value Ref Range   Glucose-Capillary 58 (L) 65 - 99 mg/dL  Glucose, capillary     Status: Abnormal   Collection Time: 10/22/16 12:16 PM  Result Value Ref Range   Glucose-Capillary 48 (L) 65 - 99 mg/dL  Glucose, capillary     Status: None   Collection Time: 10/22/16 12:33 PM  Result Value Ref Range   Glucose-Capillary 74 65 - 99 mg/dL  Glucose, capillary     Status: Abnormal   Collection Time: 10/22/16  2:12 PM  Result Value Ref Range   Glucose-Capillary 108 (H) 65 - 99 mg/dL  Glucose, capillary     Status: Abnormal   Collection Time: 10/22/16  5:02 PM  Result Value Ref Range   Glucose-Capillary 137 (H) 65 - 99 mg/dL  Glucose, capillary     Status: None   Collection Time: 10/22/16  7:47 PM  Result Value Ref Range   Glucose-Capillary 93 65 - 99 mg/dL  Glucose, capillary     Status: Abnormal   Collection Time: 10/22/16 10:05 PM  Result Value Ref Range   Glucose-Capillary 103 (H) 65 - 99 mg/dL  Glucose, capillary     Status: None   Collection Time: 10/23/16  6:06 AM  Result Value Ref Range   Glucose-Capillary 98 65 - 99 mg/dL  Glucose, capillary     Status: Abnormal   Collection Time: 10/23/16 11:59 AM  Result Value Ref Range   Glucose-Capillary 44 (LL) 65 - 99 mg/dL  Glucose, capillary     Status: Abnormal   Collection Time: 10/23/16 12:37 PM  Result Value Ref Range  Glucose-Capillary 63 (L) 65 - 99 mg/dL  Glucose, capillary     Status: None   Collection Time: 10/23/16  1:16 PM  Result Value Ref Range   Glucose-Capillary 78 65 - 99 mg/dL    Blood Alcohol level:  Lab Results  Component Value Date   ETH <5 29/52/8413    Metabolic Disorder Labs: Lab Results  Component Value Date   HGBA1C 9.9 08/05/2015   No results found for: PROLACTIN Lab Results  Component Value Date   CHOL 85 04/28/2013   TRIG 115.0 04/28/2013    HDL 37.50 (L) 04/28/2013   CHOLHDL 2 04/28/2013   VLDL 23.0 04/28/2013   LDLCALC 25 04/28/2013   LDLCALC 32 04/11/2012    Physical Findings: AIMS: Facial and Oral Movements Muscles of Facial Expression: None, normal Lips and Perioral Area: None, normal Jaw: None, normal Tongue: None, normal,Extremity Movements Upper (arms, wrists, hands, fingers): None, normal Lower (legs, knees, ankles, toes): None, normal, Trunk Movements Neck, shoulders, hips: None, normal, Overall Severity Severity of abnormal movements (highest score from questions above): None, normal Incapacitation due to abnormal movements: None, normal Patient's awareness of abnormal movements (rate only patient's report): No Awareness, Dental Status Current problems with teeth and/or dentures?: No Does patient usually wear dentures?: No  CIWA:    COWS:     Musculoskeletal: Strength & Muscle Tone: within normal limits Gait & Station: normal Patient leans: N/A  Psychiatric Specialty Exam: Physical Exam  Nursing note and vitals reviewed. Constitutional: She is oriented to person, place, and time. She appears well-developed.  Cardiovascular: Regular rhythm.   Neurological: She is alert and oriented to person, place, and time.  Psychiatric: She has a normal mood and affect. Her behavior is normal.    Review of Systems  Psychiatric/Behavioral: Positive for depression. The patient is nervous/anxious and has insomnia.     Blood pressure 107/67, pulse 88, temperature 98.9 F (37.2 C), resp. rate 16, height 5' 1"  (1.549 m), weight 115.4 kg (254 lb 8 oz), SpO2 99 %.Body mass index is 48.09 kg/m.  General Appearance: Fairly Groomed  Eye Contact:  Fair  Speech:  Slow  Volume:  Decreased  Mood:  labile, vaguely irritable  Affect:  labile, smiles at times appropriately , vaguely irritable   Thought Process:  Linear  Orientation:  Other:  fully alert and attentive   Thought Content:  currently denies hallucinations,  does not express any delusions, focused on having multiple personalities- does not appear internally preoccupied at this time  Suicidal Thoughts:  No - denies any current suicidal or self injurious ideations  Homicidal Thoughts:  No denies any homicidal or violent ideations   Memory:  Immediate;   Fair Recent;   Fair Remote;   Fair  Judgement:  Fair  Insight:  Fair   Psychomotor Activity:  Decreased   Concentration:  Concentration: Fair  Recall:  [employer] of Knowledge:  Fair  Language:  Fair  Akathisia:  No  Handed:  Right  AIMS (if indicated):     Assets:  Communication Skills Desire for Improvement Resilience  ADL's:  Intact  Cognition:  WNL  Sleep:  Number of Hours: 4.25   Assessment - patient presents with partial improvement compared to admission but remains focused on having different personalities . Currently denies suicidal ideations. Tolerating medications well, denies side effects.  Has had episode of hypoglycemia .  Treatment Plan Summary: Daily contact with patient to assess and evaluate symptoms and progress in treatment and Medication management  Encourage ongoing group and milieu participation to work on coping skills and symptom reduction Increase Risperidone to 1 mgr BID for psychosis, mood disorder Continue   Wellbutrin XL  300 mg QDAY for depression Continue Trazodone 50 mg QHS PRN for insomnia Re: hypoglycemia, I have discussed case with hospitalist consultant regarding glycemia for possible adjustments to her diabetic medication management Treatment team working on disposition planning options    Neita Garnet, MD 10/23/2016, 1:54 PM   Patient ID: Reece Packer, female   DOB: 10/05/1976, 40 y.o.   MRN: [id number]

## 2016-10-23 NOTE — Progress Notes (Signed)
Adult Psychoeducational Group Note  Date:  10/23/2016 Time:  12:23 AM  Group Topic/Focus:  Wrap-Up Group:   The focus of this group is to help patients review their daily goal of treatment and discuss progress on daily workbooks.   Participation Level:  Minimal  Participation Quality:  Attentive  Affect:  Tearful  Cognitive:  Alert  Insight: Limited  Engagement in Group:  Limited  Modes of Intervention:  Discussion, Education and Support  Additional Comments:  Pt was asked to participate in the group session, but she was unable to answer due to her becoming emotional. Pt teared up and stated, "at one point the day was above a 5, but now it's below a zero". Rocky Crafts 10/23/2016, 12:23 AM

## 2016-10-24 DIAGNOSIS — Z9049 Acquired absence of other specified parts of digestive tract: Secondary | ICD-10-CM

## 2016-10-24 LAB — GLUCOSE, CAPILLARY
GLUCOSE-CAPILLARY: 81 mg/dL (ref 65–99)
GLUCOSE-CAPILLARY: 152 mg/dL — AB (ref 65–99)
Glucose-Capillary: 100 mg/dL — ABNORMAL HIGH (ref 65–99)
Glucose-Capillary: 83 mg/dL (ref 65–99)

## 2016-10-24 MED ORDER — TRAZODONE HCL 50 MG PO TABS
50.0000 mg | ORAL_TABLET | Freq: Every evening | ORAL | Status: DC | PRN
  Administered 2016-10-24: 50 mg via ORAL
  Filled 2016-10-24 (×4): qty 1

## 2016-10-24 MED ORDER — SERTRALINE HCL 100 MG PO TABS: 200.0000 mg | ORAL_TABLET | Freq: Every day | ORAL | Status: DC

## 2016-10-24 MED ORDER — SERTRALINE HCL 100 MG PO TABS
100.0000 mg | ORAL_TABLET | Freq: Every day | ORAL | Status: DC
Start: 2016-10-24 — End: 2016-10-25
  Administered 2016-10-24 – 2016-10-25 (×2): 100 mg via ORAL
  Filled 2016-10-24 (×2): qty 1
  Filled 2016-10-24: qty 2
  Filled 2016-10-24: qty 1

## 2016-10-24 NOTE — Progress Notes (Signed)
Adult Psychoeducational Group Note  Date:  10/24/2016 Time:  8:51 PM  Group Topic/Focus:  Wrap-Up Group:   The focus of this group is to help patients review their daily goal of treatment and discuss progress on daily workbooks.   Participation Level:  Active  Participation Quality:  Appropriate  Affect:  Appropriate  Cognitive:  Appropriate  Insight: Appropriate  Engagement in Group:  Engaged  Modes of Intervention:  Discussion  Additional Comments:  The patient expressed that she rates today a 8.The patient also said that she had a good day. Nash Shearer 10/24/2016, 8:51 PM

## 2016-10-24 NOTE — [facility] Group Notes (Signed)
[facility] LCSW Group Therapy  10/24/2016 3:06 PM  Type of Therapy:  Group Therapy  Participation Level:  Minimal  Participation Quality:  Inattentive  Affect:  Flat  Cognitive:  Disorganized  Insight:  Limited  Engagement in Therapy:  Limited  Modes of Intervention:  Discussion, Education, Socialization and Support  Summary of Progress/Problems: Patients identify obstacles, self-sabotaging and enabling behaviors. Patients explore aspects of self sabotage and enabling and how to limit these self-destructive behaviors in everyday life. Pt sat quietly and listened to other group members share.   [place] MSW, LCSWA  10/24/2016, 3:06 PM

## 2016-10-24 NOTE — Progress Notes (Signed)
DAR NOTE: Patient presents with flat affect and depressed mood.  Denies pain, auditory and visual hallucinations.  Maintained on routine safety checks.  Medications given as prescribed.  Support and encouragement offered as needed.  Attended group and participated.  Patient observed responding to internal stimuli.  Patient requested and received Tylenol 650 mg for complain of generalized discomfort with good effect.

## 2016-10-24 NOTE — Progress Notes (Signed)
Recreation Therapy Notes  Date: 10/24/16 Time: 1000 Location: 500 Hall Dayroom  Group Topic: Leisure Education  Goal Area(s) Addresses:  Patient will identify positive leisure activities.  Patient will identify one positive benefit of participation in leisure activities.   Intervention: [employer], various sports equipment, paper to write on  Activity: [place]amin.  LRT put various sports equipment and random objects into a bag.  Each person in the group was to pick something from the bag.  The group would then have 20 minutes to make up a game, give it a name, create the rules and use all the equipment they selected.  At the end, they would then have to teach the other group how to play their game.  Education:  Leisure Education, Dentist  Education Outcome: Acknowledges education/In group clarification offered/Needs additional education  Clinical Observations/Feedback: Pt did not attend group.   Victorino Sparrow, LRT/CTRS         Victorino Sparrow A 10/24/2016 12:01 PM

## 2016-10-24 NOTE — Progress Notes (Signed)
[facility] MD Progress Note  10/24/2016 1:17 PM [name]  MRN:  [id number]  Subjective: Miranda [name] reports "I'm okay. I did not go to the group sessions because I did not know that it was going on. I did not sleep well last night. I have not had a bowel movement in 3 days. She states that she is 'Miranda [name]'.  Have had no personality switches today. Says was on Zoloft 200 mg prior to admission, questioned when she is going to resume it. Confirmed this this with the [place]. Miranda [name] has a prescription for Sertraline 200 mg daily that needed to be picked up now. Does not endorse medication side effects. Denies suicidal ideations at this time  Objective: I have discussed case with treatment team and have met with patient. Patient currently in bed, presents fairly related- fair eye contact. She denies suicidal ideations. At this time denies any medication side effects. As above, reports history of having multiple personalities , and states she has no frequent switches today. She states that when she does not answer questions or interact with anyone, it may mean her personality has switched to " Adam", whom she states does not interact. Today, she refers herself as 'Miranda [name]' her real self. Limited milieu participation, going to some groups, labile in groups. Resumed patient on Sertraline 100 mg daily for depression. Continue Wellbutrin XL as recommended.   Principal Problem: Major depressive disorder, recurrent episode, severe, with psychosis ([place]) Diagnosis:   Patient Active Problem List   Diagnosis Date Noted  . Major depressive disorder, recurrent episode, severe, with psychosis ([place]) [F33.3] 10/20/2016  . Major depressive disorder, recurrent episode, severe, 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[place]) [F33.3] 10/19/2016  . TIA (transient ischemic attack) [G45.9] 05/16/2016  . Arterial ischemic stroke, MCA, left, acute ([place]) [I63.512] 03/29/2016  . Plantar fasciitis [M72.2] 08/08/2013  . Obesity, Class III, BMI  40-49.9 (morbid obesity) ([place]) [E66.01] 04/09/2013  . Diabetes ([place]) [E11.9] 03/31/2013  . Unspecified sleep apnea [G47.30] 03/31/2013  . PCO (polycystic ovaries) [E28.2] 04/11/2012  . NECK PAIN [M54.2] 11/21/2010  . FATTY LIVER DISEASE [K76.89] 01/26/2010  . NAUSEA WITH VOMITING [R11.2] 01/26/2010  . ABDOMINAL PAIN, EPIGASTRIC [R10.13] 01/26/2010  . PORTAL HYPERTENSION [K76.6] 01/26/2010  . ABDOMINAL PAIN, RIGHT UPPER QUADRANT [R10.11] 12/27/2009  . Migraine headache [G43.909] 06/28/2009  . ACUTE BRONCHITIS [J20.9] 06/08/2009  . ABDOMINAL PAIN, RIGHT LOWER QUADRANT [R10.31] 11/24/2008  . BREAST MASS [N63.0] 08/21/2008  . KNEE PAIN [M25.569] 04/14/2008  . Hypothyroidism [E03.9] 03/17/2008  . Hyperlipidemia [E78.5] 03/17/2008  . DEPRESSION [F32.9] 03/17/2008  . NEUROPATHY [G58.9] 03/17/2008  . Essential hypertension [I10] 03/17/2008  . ASTHMA [J45.909] 03/17/2008  . GERD [K21.9] 03/17/2008  . Headache [R51] 03/17/2008   Total Time spent with patient:  20 minutes   Past Psychiatric History:  Past Medical History:  Past Medical History:  Diagnosis Date  . Anxiety   . Arthritis   . Asthma   . Bowel obstruction   . Cardiac arrhythmia   . Depression    sees Dr. Matilde Haymaker in [place]   . Diabetes mellitus    type 2, on insulin pump, sees Dr. Loanne Drilling   . GERD (gastroesophageal reflux disease)   . Hyperlipidemia   . Hypertension   . Hypothyroidism    sees Dr. Elyse Hsu  . Insomnia   . Migraine syndrome   . Morbid obesity ([place])   . Multiple personality   . Neck pain   . Neuropathy associated with endocrine disorder ([place])   . Sleep  apnea with use of continuous positive airway pressure (CPAP)    on CPAP  . Stroke [facility]) 03-25-16   left MCA   . Stroke [facility]) 04/2016    Past Surgical History:  Procedure Laterality Date  . blocked itestinal repair  58   age 47 months  . CHOLECYSTECTOMY  2011  . INDUCED ABORTION  1996   forced abortion  . INGUINAL HERNIA REPAIR Left  1980   age 56  . LAPAROSCOPIC ENDOMETRIOSIS FULGURATION  1998  . WISDOM TOOTH EXTRACTION  2007   Family History:  Family History  Problem Relation Age of [place]  . Adopted: Yes  . Heart disease      on both sides of family  . Diabetes      on both sides of family   Family Psychiatric  History:  Social History:  History  Alcohol Use No     History  Drug Use No    Social History   Social History  . Marital status: Married    Spouse name: N/A  . Number of children: 5  . Years of education: N/A   Occupational History  . Nurse, learning disability    Social History Main Topics  . Smoking status: Former Smoker    Packs/day: 0.00    Types: Cigarettes  . Smokeless tobacco: Never Used     Comment: quit 03/25/2016  . Alcohol use No  . Drug use: No  . Sexual activity: Yes   Other Topics Concern  . None   Social History Narrative  . None   Additional Social History:    Pain Medications: See MAR Prescriptions: See MAR Over the Counter: See MAR History of alcohol / drug use?: Yes Name of Substance 1: Cigarettes 1 - Age of First Use: UTA 1 - Amount (size/oz): Pt cargiver, it depends on the pt's current personality.  1 - Frequency: UTA 1 - Duration: UTA 1 - Last Use / Amount: UTA  Sleep: Fair  Appetite:  Fair  Current Medications: Current Facility-Administered Medications  Medication Dose Route Frequency Provider Last Rate Last Dose  . acetaminophen (TYLENOL) tablet 650 mg  650 mg Oral Q6H PRN Patrecia Pour, NP   650 mg at 10/24/16 1022  . albuterol (PROVENTIL HFA;VENTOLIN HFA) 108 (90 Base) MCG/ACT inhaler 2 puff  2 puff Inhalation Q4H PRN Patrecia Pour, NP      . alum & mag hydroxide-simeth (MAALOX/MYLANTA) 200-200-20 MG/5ML suspension 30 mL  30 mL Oral Q4H PRN Patrecia Pour, NP   30 mL at 10/21/16 2248  . buPROPion (WELLBUTRIN XL) 24 hr tablet 300 mg  300 mg Oral Daily Patrecia Pour, NP   300 mg at 10/24/16 9563  . clopidogrel (PLAVIX) tablet 75 mg   75 mg Oral Daily Patrecia Pour, NP   75 mg at 10/24/16 8756  . insulin aspart protamine- aspart (NOVOLOG MIX 70/30) injection 25 Units  25 Units Subcutaneous Q supper Eber Jones, MD   25 Units at 10/23/16 1728  . insulin aspart protamine- aspart (NOVOLOG MIX 70/30) injection 65 Units  65 Units Subcutaneous Q breakfast Eber Jones, MD   65 Units at 10/24/16 0827  . levothyroxine (SYNTHROID, LEVOTHROID) tablet 125 mcg  125 mcg Oral QAC breakfast Patrecia Pour, NP   125 mcg at 10/24/16 0645  . loperamide (IMODIUM) capsule 4 mg  4 mg Oral PRN Patrecia Pour, NP   4 mg at 10/21/16 0849  . magnesium hydroxide (MILK  OF MAGNESIA) suspension 30 mL  30 mL Oral Daily PRN Patrecia Pour, NP      . metFORMIN (GLUCOPHAGE) tablet 1,000 mg  1,000 mg Oral BID WC Patrecia Pour, NP   1,000 mg at 10/24/16 0825  . metoprolol succinate (TOPROL-XL) 24 hr tablet 12.5 mg  12.5 mg Oral Daily Patrecia Pour, NP   12.5 mg at 10/24/16 0349  . ondansetron (ZOFRAN-ODT) disintegrating tablet 4 mg  4 mg Oral Q6H PRN Encarnacion Slates, NP   4 mg at 10/24/16 0315  . pantoprazole (PROTONIX) EC tablet 40 mg  40 mg Oral Daily Patrecia Pour, NP   40 mg at 10/24/16 1791  . pioglitazone (ACTOS) tablet 45 mg  45 mg Oral Daily Patrecia Pour, NP   45 mg at 10/24/16 5056  . risperiDONE (RISPERDAL) tablet 1 mg  1 mg Oral BID Jenne Campus, MD   1 mg at 10/24/16 0824  . rosuvastatin (CRESTOR) tablet 20 mg  20 mg Oral Daily Patrecia Pour, NP   20 mg at 10/24/16 9794  . topiramate (TOPAMAX) tablet 100 mg  100 mg Oral BID Patrecia Pour, NP   100 mg at 10/24/16 8016  . traZODone (DESYREL) tablet 50 mg  50 mg Oral QHS PRN Jenne Campus, MD        Lab Results:  Results for orders placed or performed during the hospital encounter of 10/20/16 (from the past 48 hour(s))  Glucose, capillary     Status: Abnormal   Collection Time: 10/22/16  2:12 PM  Result Value Ref Range   Glucose-Capillary 108 (H) 65 - 99 mg/dL  Glucose,  capillary     Status: Abnormal   Collection Time: 10/22/16  5:02 PM  Result Value Ref Range   Glucose-Capillary 137 (H) 65 - 99 mg/dL  Glucose, capillary     Status: None   Collection Time: 10/22/16  7:47 PM  Result Value Ref Range   Glucose-Capillary 93 65 - 99 mg/dL  Glucose, capillary     Status: Abnormal   Collection Time: 10/22/16 10:05 PM  Result Value Ref Range   Glucose-Capillary 103 (H) 65 - 99 mg/dL  Glucose, capillary     Status: None   Collection Time: 10/23/16  6:06 AM  Result Value Ref Range   Glucose-Capillary 98 65 - 99 mg/dL  Glucose, capillary     Status: Abnormal   Collection Time: 10/23/16 11:59 AM  Result Value Ref Range   Glucose-Capillary 44 (LL) 65 - 99 mg/dL  Glucose, capillary     Status: Abnormal   Collection Time: 10/23/16 12:37 PM  Result Value Ref Range   Glucose-Capillary 63 (L) 65 - 99 mg/dL  Glucose, capillary     Status: None   Collection Time: 10/23/16  1:16 PM  Result Value Ref Range   Glucose-Capillary 78 65 - 99 mg/dL  Glucose, capillary     Status: Abnormal   Collection Time: 10/23/16  5:00 PM  Result Value Ref Range   Glucose-Capillary 131 (H) 65 - 99 mg/dL  Glucose, capillary     Status: None   Collection Time: 10/23/16  9:13 PM  Result Value Ref Range   Glucose-Capillary 88 65 - 99 mg/dL  Glucose, capillary     Status: Abnormal   Collection Time: 10/24/16  6:13 AM  Result Value Ref Range   Glucose-Capillary 100 (H) 65 - 99 mg/dL  Glucose, capillary     Status: None  Collection Time: 10/24/16 11:45 AM  Result Value Ref Range   Glucose-Capillary 83 65 - 99 mg/dL   Comment 1 Notify RN    Comment 2 Document in Chart     Blood Alcohol level:  Lab Results  Component Value Date   ETH <5 53/64/6803    Metabolic Disorder Labs: Lab Results  Component Value Date   HGBA1C 9.9 08/05/2015   No results found for: PROLACTIN Lab Results  Component Value Date   CHOL 85 04/28/2013   TRIG 115.0 04/28/2013   HDL 37.50 (L)  04/28/2013   CHOLHDL 2 04/28/2013   VLDL 23.0 04/28/2013   LDLCALC 25 04/28/2013   LDLCALC 32 04/11/2012    Physical Findings: AIMS: Facial and Oral Movements Muscles of Facial Expression: None, normal Lips and Perioral Area: None, normal Jaw: None, normal Tongue: None, normal,Extremity Movements Upper (arms, wrists, hands, fingers): None, normal Lower (legs, knees, ankles, toes): None, normal, Trunk Movements Neck, shoulders, hips: None, normal, Overall Severity Severity of abnormal movements (highest score from questions above): None, normal Incapacitation due to abnormal movements: None, normal Patient's awareness of abnormal movements (rate only patient's report): No Awareness, Dental Status Current problems with teeth and/or dentures?: No Does patient usually wear dentures?: No  CIWA:    COWS:     Musculoskeletal: Strength & Muscle Tone: within normal limits Gait & Station: normal Patient leans: N/A  Psychiatric Specialty Exam: Physical Exam  Nursing note and vitals reviewed. Constitutional: She is oriented to person, place, and time. She appears well-developed.  Cardiovascular: Regular rhythm.   Neurological: She is alert and oriented to person, place, and time.  Psychiatric: She has a normal mood and affect. Her behavior is normal.    Review of Systems  Psychiatric/Behavioral: Positive for depression. The patient is nervous/anxious and has insomnia.     Blood pressure 115/67, pulse 76, temperature 97.9 F (36.6 C), temperature source Oral, resp. rate 16, height 5' 1"  (1.549 m), weight 115.4 kg (254 lb 8 oz), SpO2 99 %.Body mass index is 48.09 kg/m.  General Appearance: Fairly Groomed  Eye Contact:  Fair  Speech:  Slow  Volume:  Decreased  Mood:  labile, vaguely irritable  Affect:  labile, smiles at times appropriately , vaguely irritable   Thought Process:  Linear  Orientation:  Other:  fully alert and attentive   Thought Content:  currently denies  hallucinations, does not express any delusions, focused on having multiple personalities- does not appear internally preoccupied at this time  Suicidal Thoughts:  No - denies any current suicidal or self injurious ideations  Homicidal Thoughts:  No denies any homicidal or violent ideations   Memory:  Immediate;   Fair Recent;   Fair Remote;   Fair  Judgement:  Fair  Insight:  Fair   Psychomotor Activity:  Decreased   Concentration:  Concentration: Fair  Recall:  [employer] of Knowledge:  Fair  Language:  Fair  Akathisia:  No  Handed:  Right  AIMS (if indicated):     Assets:  Communication Skills Desire for Improvement Resilience  ADL's:  Intact  Cognition:  WNL  Sleep:  Number of Hours: 6.25   Assessment - patient presents with partial improvement compared to admission but remains focused on having different personalities . Currently denies suicidal ideations. Tolerating medications well, denies side effects.  Has had episode of hypoglycemia .  Treatment Plan Summary: Daily contact with patient to assess and evaluate symptoms and progress in treatment and Medication management  Encourage ongoing group and milieu participation to work on Radiographer, therapeutic and symptom reduction Will continue Risperidone 1 mgr BID for psychosis, mood disorder Continue  Wellbutrin XL  300 mg QDAY for depression Continue Trazodone 50 mg QHS PRN for insomnia Re: hypoglycemia, hospitalist consultanted regarding glycemia for possible adjustments to her diabetic medication management Treatment team working on disposition planning options. Reinitiated Sertraline 100 mg for depression.   Encarnacion Slates, NP, PMHNP, FNP-BC. 10/24/2016, 1:17 PM  Patient ID: Miranda [name], female   DOB: Nov 05, 1975, 40 y.o.   MRN: [id number] Agree with NP progress note as above

## 2016-10-24 NOTE — Progress Notes (Signed)
Recreation Therapy Notes  INPATIENT RECREATION THERAPY ASSESSMENT  Patient Details Name: Miranda [name] MRN: [id number] DOB: Nov 24, 1975 Today's Date: 10/24/2016  Patient Stressors: Other (Comment) (Contact with adopted mom over the past year; roommate is pregnant)  Pt stated she was here because she stopped functioning very well, took off and scared her roommate and herself.  Coping Skills:   Isolate, Substance Abuse, Exercise, Art/Dance, Talking, Music, Sports, Other (Comment) (Kayak, meditate)  Personal Challenges: Communication, Concentration, Decision-Making, Expressing Yourself, Problem-Solving, Relationships, Self-Esteem/Confidence, Social Interaction, Stress Management  Leisure Interests (2+):  Games - Cards, Individual - Other (Comment) (Snuggle with cats)  Awareness of Community Resources:  Yes  Community Resources:  Sondra Barges, Other (Comment) (Shopping center)  Current Use: Yes  Patient Strengths:  Resourceful, smart; patient to a fault  Patient Identified Areas of Improvement:  Being too patient; don't want to seem to pestering  Current Recreation Participation:  "I'm not going to be here long enough to answer that question"  Patient Goal for Hospitalization:  "I'm lost"  [place] of Residence:  [place] of Residence:  [place]   Current [place] (including self-harm):  No  Current [place]:  No  Consent to Intern Participation: N/A   Victorino Sparrow, LRT/CTRS  Victorino Sparrow A 10/24/2016, 2:11 PM

## 2016-10-24 NOTE — Progress Notes (Signed)
D: Pt was in her room upon initial approach.  Pt presents with depressed affect and mood; affect brighter and mood improved later in the evening.  Her goal is to "stay mellow, go to bed calm and sleep."  Pt reports difficulty sleeping last night and reports "having trouble with dreams and flashbacks."  She reports having difficulty distinguishing between reality and her dreams/flashbacks at times.  Pt denies SI/HI, denies hallucinations, reports pain from headache of 5/10.  Pt informed staff that she tends to "isolate" when she is having thoughts of harming self or others.  Pt has been visible in milieu interacting with peers and staff appropriately.  Pt attended evening group.   A: Introduced self to pt.  Actively listened to pt and offered support and encouragement.  PRN medication administered for sleep, indigestion, and pain.  Medication education provided.   R: Pt is safe on the unit.  Pt is compliant with medications.  Pt verbally contracts for safety.  Will continue to monitor and assess.

## 2016-10-24 NOTE — Progress Notes (Signed)
DAR Note: Miranda [name] has remained in her room this evening.  She would get up for water in the day room.  She stated that she had a migraine earlier and the Zofran has not helped with her nausea.  She requested ginger ale and went to her room to lay down.  Her HS blood sugar was 88.  She declined her evening snack.  [place] provided and encouraged her to eat slow and notify staff if there was anything that she needed.  She declined sleeping pill at bedtime.  She denies SI/HI or A/V hallucinations at this time.  Encouraged participation in group and unit activities.  Q 15 minute checks maintained for safety.  We will continue to monitor the progress towards her goals.

## 2016-10-25 DIAGNOSIS — F4310 Post-traumatic stress disorder, unspecified: Secondary | ICD-10-CM | POA: Diagnosis present

## 2016-10-25 DIAGNOSIS — F4481 Dissociative identity disorder: Secondary | ICD-10-CM | POA: Diagnosis present

## 2016-10-25 DIAGNOSIS — Z87891 Personal history of nicotine dependence: Secondary | ICD-10-CM

## 2016-10-25 DIAGNOSIS — R45851 Suicidal ideations: Secondary | ICD-10-CM

## 2016-10-25 DIAGNOSIS — E119 Type 2 diabetes mellitus without complications: Secondary | ICD-10-CM

## 2016-10-25 LAB — GLUCOSE, CAPILLARY
GLUCOSE-CAPILLARY: 140 mg/dL — AB (ref 65–99)
GLUCOSE-CAPILLARY: 56 mg/dL — AB (ref 65–99)
Glucose-Capillary: 87 mg/dL (ref 65–99)
Glucose-Capillary: 102 mg/dL — ABNORMAL HIGH (ref 65–99)
Glucose-Capillary: 73 mg/dL (ref 65–99)

## 2016-10-25 MED ORDER — BENZTROPINE MESYLATE 0.5 MG PO TABS: 0.5000 mg | ORAL_TABLET | Freq: Four times a day (QID) | ORAL | Status: DC | PRN

## 2016-10-25 MED ORDER — ARIPIPRAZOLE 5 MG PO TABS
5.0000 mg | ORAL_TABLET | Freq: Every day | ORAL | Status: DC
  Administered 2016-10-25: 5 mg via ORAL
  Filled 2016-10-25 (×3): qty 1

## 2016-10-25 MED ORDER — SERTRALINE HCL 100 MG PO TABS
125.0000 mg | ORAL_TABLET | Freq: Every day | ORAL | Status: DC
  Administered 2016-10-26 – 2016-10-27 (×2): 125 mg via ORAL
  Filled 2016-10-25 (×4): qty 1

## 2016-10-25 MED ORDER — INSULIN ASPART PROT & ASPART (70-30 MIX) 100 UNIT/ML ~~LOC~~ SUSP
20.0000 [IU] | Freq: Every day | SUBCUTANEOUS | Status: DC
  Administered 2016-10-25: 20 [IU] via SUBCUTANEOUS

## 2016-10-25 MED ORDER — LORAZEPAM 2 MG/ML IJ SOLN: 1.0000 mg | Freq: Four times a day (QID) | INTRAMUSCULAR | Status: DC | PRN

## 2016-10-25 MED ORDER — HALOPERIDOL LACTATE 5 MG/ML IJ SOLN: 5.0000 mg | Freq: Three times a day (TID) | INTRAMUSCULAR | Status: DC | PRN

## 2016-10-25 MED ORDER — BENZTROPINE MESYLATE 1 MG/ML IJ SOLN: 0.5000 mg | Freq: Four times a day (QID) | INTRAMUSCULAR | Status: DC | PRN

## 2016-10-25 MED ORDER — RISPERIDONE 0.5 MG PO TABS
0.5000 mg | ORAL_TABLET | Freq: Two times a day (BID) | ORAL | Status: DC
  Administered 2016-10-25 – 2016-10-26 (×2): 0.5 mg via ORAL
  Filled 2016-10-25 (×6): qty 1

## 2016-10-25 MED ORDER — LORAZEPAM 1 MG PO TABS
1.0000 mg | ORAL_TABLET | Freq: Four times a day (QID) | ORAL | Status: DC | PRN
  Administered 2016-10-27: 1 mg via ORAL
  Filled 2016-10-25: qty 1

## 2016-10-25 MED ORDER — HALOPERIDOL 5 MG PO TABS: 5.0000 mg | ORAL_TABLET | Freq: Three times a day (TID) | ORAL | Status: DC | PRN

## 2016-10-25 MED ORDER — TRAZODONE HCL 100 MG PO TABS
125.0000 mg | ORAL_TABLET | Freq: Every day | ORAL | Status: DC
  Administered 2016-10-25 – 2016-10-26 (×2): 125 mg via ORAL
  Filled 2016-10-25 (×3): qty 1

## 2016-10-25 MED ORDER — INSULIN ASPART PROT & ASPART (70-30 MIX) 100 UNIT/ML ~~LOC~~ SUSP
60.0000 [IU] | Freq: Every day | SUBCUTANEOUS | Status: DC
  Administered 2016-10-26: 60 [IU] via SUBCUTANEOUS

## 2016-10-25 MED ORDER — LORAZEPAM 1 MG PO TABS: 1.0000 mg | ORAL_TABLET | Freq: Four times a day (QID) | ORAL | Status: DC | PRN

## 2016-10-25 MED ORDER — LORAZEPAM 2 MG/ML IJ SOLN
0.5000 mg | Freq: Four times a day (QID) | INTRAMUSCULAR | Status: DC | PRN
Start: 2016-10-25 — End: 2016-10-25

## 2016-10-25 MED ORDER — LORATADINE 10 MG PO TABS
10.0000 mg | ORAL_TABLET | Freq: Every day | ORAL | Status: DC
  Administered 2016-10-25 – 2016-10-27 (×3): 10 mg via ORAL
  Filled 2016-10-25 (×5): qty 1
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
## 2016-10-25 NOTE — Progress Notes (Signed)
Inpatient Diabetes Program Recommendations  AACE/ADA: New Consensus Statement on Inpatient Glycemic Control (2015)  Target Ranges:  Prepandial:   less than 140 mg/dL      Peak postprandial:   less than 180 mg/dL (1-2 hours)      Critically ill patients:  140 - 180 mg/dL   Results for Miranda, [name] (MRN [id number]) as of 10/25/2016 09:17  Ref. Range 10/22/2016 06:12 10/22/2016 11:51 10/22/2016 12:16 10/22/2016 12:33 10/22/2016 14:12 10/22/2016 17:02 10/22/2016 19:47 10/22/2016 22:05  Glucose-Capillary Latest Ref Range: 65 - 99 mg/dL 104 (H) 58 (L) 48 (L) 74 108 (H) 137 (H) 93 103 (H)   Results for Miranda, [name] (MRN [id number]) as of 10/25/2016 09:17  Ref. Range 10/23/2016 06:06 10/23/2016 11:59 10/23/2016 12:37 10/23/2016 13:16 10/23/2016 17:00 10/23/2016 21:13  Glucose-Capillary Latest Ref Range: 65 - 99 mg/dL 98 44 (LL) 63 (L) 78 131 (H) 88   Results for Miranda, [name] (MRN [id number]) as of 10/25/2016 09:17  Ref. Range 10/24/2016 06:13 10/24/2016 11:45 10/24/2016 17:05 10/24/2016 20:08  Glucose-Capillary Latest Ref Range: 65 - 99 mg/dL 100 (H) 83 81 152 (H)    Admit with: MDD/ Psychosis  History: DM2  Home DM Meds: Metformin 1000 mg BID       Actos 45 mg daily       70/30 Insulin- 70 units AM/ 30 units PM  Current Insulin Orders: Metformin 1000 mg BID                 Actos 45 mg daily                 70/30 Insulin- 65 units AM/ 25 units PM      -Have reviewed CBGs since 12/24.  Note patient had Hypoglycemic episodes on 12/24 and 12/25 at lunch time after receiving 70 units 70/30 insulin with breakfast.  -Note 70/30 insulin reduced to 65 units AM/ 25 units PM on 12/26.    -Suppertime dose of 70/30 insulin held last night b/c CBG was only 81 mg/dl.     MD- Please consider the following 70/30 Insulin adjustments:  Recommend further reducing 70/30 Insulin to: 60 units with Breakfast and 20 units with Supper      --Will follow patient during  hospitalization--  Wyn Quaker RN, MSN, CDE Diabetes Coordinator Inpatient Glycemic Control Team Team Pager: [phone] (8a-5p)

## 2016-10-25 NOTE — Progress Notes (Signed)
D: Pt was in the day room upon initial approach.  Pt presents with depressed affect and mood.  Pt reports her goal is "mindfulness."  Pt reports she has thoughts of self-harm when she has nightmares and states "it's nothing I'd act on."  Pt verbally contracts for safety.  Pt denies HI, denies hallucinations.  Pt has been visible in milieu interacting with peers and staff appropriately.  Pt attended evening group.   A: Introduced self to pt.  Actively listened to pt and offered support and encouragement. Medications administered per order.  R: Pt is safe on the unit.  Pt is compliant with medications.  Pt verbally contracts for safety.  Will continue to monitor and assess.

## 2016-10-25 NOTE — Progress Notes (Signed)
Recreation Therapy Notes  Date: 10/25/16 Time: 1000 Location: 500 Hall Dayroom  Group Topic: Self-Esteem  Goal Area(s) Addresses:  Patient will identify positive ways to increase self-esteem. Patient will verbalize benefit of increased self-esteem.  Behavioral Response: Minimal  Intervention: Worksheet with mirror outline, writing utencils  Activity: Haematologist.  Patients were given a worksheet with an outline of a mirror on it.  Patients were to write or draw how they see themselves at this moment.  Once patients identified how they viewed themselves, they were to identify any areas where they felt they could use some improvement and what steps they could take to improve in those areas.  Education:  Self-Esteem, Dentist.   Education Outcome: Acknowledges education/In group clarification offered/Needs additional education  Clinical Observations/Feedback: Pt was quiet and flat.  Pt didn't say anything during group.  Pt did work diligently on her worksheet.  Pt left early with doctor and did not return.   Victorino Sparrow, LRT/CTRS         Victorino Sparrow A 10/25/2016 11:57 AM

## 2016-10-25 NOTE — BHH Group Notes (Signed)
[facility] LCSW Group Therapy  10/25/2016 4:33 PM  Type of Therapy:  Group Therapy  Participation Level:  Active  Participation Quality: Attentive   Affect:  Flat  Cognitive:  Alert  Insight:  Limited  Engagement in Therapy:  Limited  Modes of Intervention:  Discussion, Education, Socialization and Support  Summary of Progress/Problems: Balance in life: Patients will discuss the concept of balance and how it looks and feels to be unbalanced. Pt will identify areas in their life that is unbalanced and ways to become more balanced.  Pt discussed the need to feel believed and validated. She states she needs to cut out some family members to regain balance.   [place] MSW, LCSWA  10/25/2016, 4:33 PM

## 2016-10-25 NOTE — Progress Notes (Addendum)
Adult Psychoeducational Group Note  Date:  10/25/2016 Time:  9:25 PM  Group Topic/Focus:  Wrap-Up Group:   The focus of this group is to help patients review their daily goal of treatment and discuss progress on daily workbooks.   Participation Level:  Active  Participation Quality:  Appropriate  Affect:  Appropriate  Cognitive:  Alert  Insight: Appropriate  Engagement in Group:  Engaged  Modes of Intervention:  Discussion  Additional Comments:  Patient states, "my day was okay". Patient's goal throughout the day was to become more present.  Yiselle Babich L Jameir Ake 10/25/2016, 9:25 PM

## 2016-10-25 NOTE — Tx Team (Signed)
Interdisciplinary Treatment and Diagnostic Plan Update  10/25/2016 Time of Session: 9:00 am Miranda [name] MRN: [id number]  Principal Diagnosis: Major depressive disorder, recurrent episode, severe, with psychosis ([place])  Secondary Diagnoses: Principal Problem:   Major depressive disorder, recurrent episode, severe, with psychosis ([place])   Current Medications:  Current Facility-Administered Medications  Medication Dose Route Frequency Provider Last Rate Last Dose  . acetaminophen (TYLENOL) tablet 650 mg  650 mg Oral Q6H PRN Patrecia Pour, NP   650 mg at 10/24/16 2003  . albuterol (PROVENTIL HFA;VENTOLIN HFA) 108 (90 Base) MCG/ACT inhaler 2 puff  2 puff Inhalation Q4H PRN Patrecia Pour, NP      . alum & mag hydroxide-simeth (MAALOX/MYLANTA) 200-200-20 MG/5ML suspension 30 mL  30 mL Oral Q4H PRN Patrecia Pour, NP   30 mL at 10/24/16 2219  . buPROPion (WELLBUTRIN XL) 24 hr tablet 300 mg  300 mg Oral Daily Patrecia Pour, NP   300 mg at 10/25/16 0804  . clopidogrel (PLAVIX) tablet 75 mg  75 mg Oral Daily Patrecia Pour, NP   75 mg at 10/25/16 0804  . insulin aspart protamine- aspart (NOVOLOG MIX 70/30) injection 25 Units  25 Units Subcutaneous Q supper Eber Jones, MD   Stopped at 10/24/16 1706  . insulin aspart protamine- aspart (NOVOLOG MIX 70/30) injection 65 Units  65 Units Subcutaneous Q breakfast Eber Jones, MD   65 Units at 10/25/16 [id number]  . levothyroxine (SYNTHROID, LEVOTHROID) tablet 125 mcg  125 mcg Oral QAC breakfast Patrecia Pour, NP   125 mcg at 10/25/16 0636  . loperamide (IMODIUM) capsule 4 mg  4 mg Oral PRN Patrecia Pour, NP   4 mg at 10/21/16 0849  . magnesium hydroxide (MILK OF MAGNESIA) suspension 30 mL  30 mL Oral Daily PRN Patrecia Pour, NP   30 mL at 10/25/16 [id number]  . metFORMIN (GLUCOPHAGE) tablet 1,000 mg  1,000 mg Oral BID WC Patrecia Pour, NP   1,000 mg at 10/25/16 [id number]  . metoprolol succinate (TOPROL-XL) 24 hr tablet 12.5 mg  12.5 mg Oral  Daily Patrecia Pour, NP   12.5 mg at 10/25/16 0804  . ondansetron (ZOFRAN-ODT) disintegrating tablet 4 mg  4 mg Oral Q6H PRN Encarnacion Slates, NP   4 mg at 10/24/16 0315  . pantoprazole (PROTONIX) EC tablet 40 mg  40 mg Oral Daily Patrecia Pour, NP   40 mg at 10/25/16 0805  . pioglitazone (ACTOS) tablet 45 mg  45 mg Oral Daily Patrecia Pour, NP   45 mg at 10/25/16 0805  . risperiDONE (RISPERDAL) tablet 1 mg  1 mg Oral BID Jenne Campus, MD   1 mg at 10/25/16 0804  . rosuvastatin (CRESTOR) tablet 20 mg  20 mg Oral Daily Patrecia Pour, NP   20 mg at 10/25/16 0804  . [START ON 10/26/2016] sertraline (ZOLOFT) tablet 125 mg  125 mg Oral Daily Saramma Eappen, MD      . topiramate (TOPAMAX) tablet 100 mg  100 mg Oral BID Patrecia Pour, NP   100 mg at 10/25/16 0804  . traZODone (DESYREL) tablet 125 mg  125 mg Oral QHS Ursula Alert, MD       PTA Medications: Prescriptions Prior to Admission  Medication Sig Dispense Refill Last Dose  . albuterol (PROAIR HFA) 108 (90 BASE) MCG/ACT inhaler Inhale 2 puffs into the lungs every 4 (four) hours as needed for wheezing. 8.5 g 11  Past Month at Unknown time  . buPROPion (WELLBUTRIN XL) 300 MG 24 hr tablet Take 300 mg by mouth daily.   10/17/2016 at Unknown time  . clopidogrel (PLAVIX) 75 MG tablet Take 1 tablet (75 mg total) by mouth daily. 90 tablet 3 10/17/2016 at Unknown time  . co-enzyme Q-10 30 MG capsule Take 30 mg by mouth daily.   10/17/2016 at Unknown time  . Fexofenadine HCl (ALLEGRA PO) Take 1 tablet by mouth daily.   10/17/2016 at Unknown time  . Glucos-Chondroit-Hyaluron-MSM (GLUCOSAMINE CHONDROITIN JOINT PO) Take 2 tablets by mouth daily.   10/17/2016 at Unknown time  . glucose blood (BAYER CONTOUR NEXT TEST) test strip 1 each by Other route 4 (four) times daily. And lancets 4/day 120 each 12 Taking  . levothyroxine (SYNTHROID, LEVOTHROID) 125 MCG tablet TAKE 1 TABLET BY MOUTH EVERY DAY 90 tablet 0 10/17/2016 at Unknown time  . Magnesium 500 MG  TABS Take 500 mg by mouth daily.   10/17/2016 at Unknown time  . metFORMIN (GLUCOPHAGE) 1000 MG tablet TAKE 1 TABLET BY MOUTH TWICE A DAY WITH A MEAL 60 tablet 0 10/17/2016 at Unknown time  . metoprolol succinate (TOPROL-XL) 25 MG 24 hr tablet TAKE 1/2 TABLET BY MOUTH EVERY DAY 45 tablet 0 10/17/2016 at Unknown time  . MILK THISTLE PO Take 1 capsule by mouth daily. 1000 mg   10/17/2016 at Unknown time  . Multiple Vitamin (MULTIVITAMIN) tablet Take 1 tablet by mouth daily. Prenatal   10/17/2016 at Unknown time  . NOVOLIN 70/30 RELION (70-30) 100 UNIT/ML injection INJECT 70 UNITS WITH BREAKFAST AND 30 UNITS WITH SUPPER 30 mL 0 10/17/2016 at Unknown time  . omeprazole (PRILOSEC) 40 MG capsule Take 1 capsule (40 mg total) by mouth daily. 90 capsule 3 10/17/2016 at Unknown time  . ondansetron (ZOFRAN ODT) 8 MG disintegrating tablet Take 1 tablet (8 mg total) by mouth every 8 (eight) hours as needed for nausea. (Patient not taking: Reported on 08/29/2016) 20 tablet 0 Not Taking  . pioglitazone (ACTOS) 45 MG tablet TAKE 1 TABLET(45 MG) BY MOUTH DAILY 90 tablet 0 10/17/2016 at Unknown time  . prazosin (MINIPRESS) 5 MG capsule Take 10 mg by mouth at bedtime. Reported on 03/29/2016   Past Month at Unknown time  . rosuvastatin (CRESTOR) 20 MG tablet Take 1 tablet (20 mg total) by mouth daily. 30 tablet 11 10/17/2016 at Unknown time  . topiramate (TOPAMAX) 100 MG tablet Take 1 tablet (100 mg total) by mouth 2 (two) times daily. 60 tablet 5 10/17/2016 at Unknown time  . valACYclovir (VALTREX) 1000 MG tablet Take 1 tablet (1,000 mg total) by mouth 2 (two) times daily. (Patient not taking: Reported on 10/18/2016) 60 tablet 5 Not Taking at Unknown time    Patient Stressors: Health problems Marital or family conflict Traumatic event  Patient Strengths: Average or above average intelligence Capable of independent living Communication skills General fund of knowledge Motivation for treatment/growth Supportive  family/friends  Treatment Modalities: Medication Management, Group therapy, Case management,  1 to 1 session with clinician, Psychoeducation, Recreational therapy.   Physician Treatment Plan for Primary Diagnosis: Major depressive disorder, recurrent episode, severe, with psychosis ([place]) Long Term Goal(s): Improvement in symptoms so as ready for discharge Improvement in symptoms so as ready for discharge   Short Term Goals: Ability to identify changes in lifestyle to reduce recurrence of condition will improve Ability to demonstrate self-control will improve Compliance with prescribed medications will improve Ability to identify and develop effective  coping behaviors will improve Ability to maintain clinical measurements within normal limits will improve Ability to identify triggers associated with substance abuse/mental health issues will improve  Medication Management: Evaluate patient's response, side effects, and tolerance of medication regimen.  Therapeutic Interventions: 1 to 1 sessions, Unit Group sessions and Medication administration.  Evaluation of Outcomes: Progressing  Physician Treatment Plan for Secondary Diagnosis: Principal Problem:   Major depressive disorder, recurrent episode, severe, with psychosis ([place])  Long Term Goal(s): Improvement in symptoms so as ready for discharge Improvement in symptoms so as ready for discharge   Short Term Goals: Ability to identify changes in lifestyle to reduce recurrence of condition will improve Ability to demonstrate self-control will improve Compliance with prescribed medications will improve Ability to identify and develop effective coping behaviors will improve Ability to maintain clinical measurements within normal limits will improve Ability to identify triggers associated with substance abuse/mental health issues will improve     Medication Management: Evaluate patient's response, side effects, and tolerance of medication  regimen.  Therapeutic Interventions: 1 to 1 sessions, Unit Group sessions and Medication administration.  Evaluation of Outcomes: Progressing   RN Treatment Plan for Primary Diagnosis: Major depressive disorder, recurrent episode, severe, with psychosis ([place]) Long Term Goal(s): Knowledge of disease and therapeutic regimen to maintain health will improve  Short Term Goals: Ability to verbalize feelings will improve, Ability to identify and develop effective coping behaviors will improve and Compliance with prescribed medications will improve  Medication Management: RN will administer medications as ordered by provider, will assess and evaluate patient's response and provide education to patient for prescribed medication. RN will report any adverse and/or side effects to prescribing provider.  Therapeutic Interventions: 1 on 1 counseling sessions, Psychoeducation, Medication administration, Evaluate responses to treatment, Monitor vital signs and CBGs as ordered, Perform/monitor CIWA, COWS, AIMS and Fall Risk screenings as ordered, Perform wound care treatments as ordered.  Evaluation of Outcomes: Progressing   Recreational Therapy Treatment Plan for Primary Diagnosis: PTSD (post-traumatic stress disorder) Long Term Goal(s): LTG- Patient will participate in recreation therapy tx in at least 2 group sessions without prompting from LRT.  Short Term Goals: Patient will improve self-esteem as demonstrated by ability to identify at least 5 positive qualities about him/herself by conclusion of recreation therapy tx.  Treatment Modalities: Group and Pet Therapy  Therapeutic Interventions: Psychoeducation  Evaluation of Outcomes: Progressing   LCSW Treatment Plan for Primary Diagnosis: Major depressive disorder, recurrent episode, severe, with psychosis ([place]) Long Term Goal(s): Safe transition to appropriate next level of care at discharge, Engage patient in therapeutic group addressing  interpersonal concerns.  Short Term Goals: Engage patient in aftercare planning with referrals and resources, Increase social support, Increase ability to appropriately verbalize feelings, Increase emotional regulation, Facilitate acceptance of mental health diagnosis and concerns, Identify triggers associated with mental health/substance abuse issues and Increase skills for wellness and recovery  Therapeutic Interventions: Assess for all discharge needs, 1 to 1 time with Social worker, Explore available resources and support systems, Assess for adequacy in community support network, Educate family and significant other(s) on suicide prevention, Complete Psychosocial Assessment, Interpersonal group therapy.  Evaluation of Outcomes: Progressing   Progress in Treatment: Attending groups: Yes. Participating in groups: Yes. Taking medication as prescribed: Yes. Toleration medication: Yes. Family/Significant other contact made: No, will contact:  CSW assessing  Patient understands diagnosis: No. and As evidenced by:  limited insight  Discussing patient identified problems/goals with staff: Yes. Medical problems stabilized or resolved: Yes. Denies suicidal/homicidal  ideation: Contracts for safety on unit.  Issues/concerns per patient self-inventory: No. Other: NA  New problem(s) identified: No, Describe:  NA  New Short Term/Long Term Goal(s): NA  Discharge Plan or Barriers: Pt plans to return home and follow up with outpatient.    Reason for Continuation of Hospitalization: Delusions  Medication stabilization Suicidal ideation Other; describe Confusion   Estimated Length of Stay: 3-5 days   Attendees: Patient: 10/25/2016 10:40 AM  Physician: Ursula Alert, MD  10/25/2016 10:40 AM  Nursing: Idamae Lusher, RN  10/25/2016 10:40 AM  RN Care Manager: 10/25/2016 10:40 AM  Social Worker: Wray Kearns, [place] 10/25/2016 10:40 AM  Recreational Therapist:  10/25/2016 10:40 AM  Other:   10/25/2016 10:40 AM  Other:  10/25/2016 10:40 AM  Other: 10/25/2016 10:40 AM    Scribe for Treatment Team: Wray Kearns, LCSWA 10/25/2016 10:40 AM

## 2016-10-25 NOTE — Progress Notes (Signed)
DAR NOTE: Patient presents with flat affect and depressed mood.  Patient observed responding to internal stimuli during assessment.  Reports suicidal thoughts on self inventory form but contracts for safety during assessment.  Rates depression at 4, hopelessness at 5, and anxiety at 7.  Maintained on routine safety checks.  Medications given as prescribed.  Support and encouragement offered as needed.  Attended group and participated.  States goal for today is "talk to social worker about Josem Kaufmann and getting records."  Patient was visible in milieu.  Blood glucose result of 56 mg/dl reported to MD.  Patient offered glucose tablet and juice.  CBG result of 87 obtained after a recheck.  Patient is alert and oriented.

## 2016-10-26 LAB — GLUCOSE, CAPILLARY
GLUCOSE-CAPILLARY: 124 mg/dL — AB (ref 65–99)
GLUCOSE-CAPILLARY: 73 mg/dL (ref 65–99)
Glucose-Capillary: 124 mg/dL — ABNORMAL HIGH (ref 65–99)
Glucose-Capillary: 120 mg/dL — ABNORMAL HIGH (ref 65–99)

## 2016-10-26 LAB — LIPID PANEL
CHOL/HDL RATIO: 2.3 ratio
CHOLESTEROL: 110 mg/dL (ref 0–200)
HDL: 47 mg/dL (ref >40)
LDL Cholesterol: 45 mg/dL (ref 0–99)
Triglycerides: 91 mg/dL (ref <150)
VLDL: 18 mg/dL (ref 0–40)

## 2016-10-26 LAB — TSH: TSH: 0.752 u[IU]/mL (ref 0.350–4.500)

## 2016-10-26 MED ORDER — ARIPIPRAZOLE 10 MG PO TABS
10.0000 mg | ORAL_TABLET | Freq: Every day | ORAL | Status: DC
  Administered 2016-10-26: 10 mg via ORAL
  Filled 2016-10-26 (×4): qty 1

## 2016-10-26 MED ORDER — INSULIN ASPART PROT & ASPART (70-30 MIX) 100 UNIT/ML ~~LOC~~ SUSP
55.0000 [IU] | Freq: Every day | SUBCUTANEOUS | Status: DC
  Administered 2016-10-27: 55 [IU] via SUBCUTANEOUS

## 2016-10-26 MED ORDER — INSULIN ASPART PROT & ASPART (70-30 MIX) 100 UNIT/ML ~~LOC~~ SUSP
15.0000 [IU] | Freq: Every day | SUBCUTANEOUS | Status: DC
  Administered 2016-10-26: 15 [IU] via SUBCUTANEOUS

## 2016-10-26 MED ORDER — DOCUSATE SODIUM 100 MG PO CAPS
100.0000 mg | ORAL_CAPSULE | Freq: Two times a day (BID) | ORAL | Status: DC
  Administered 2016-10-26 – 2016-10-27 (×2): 100 mg via ORAL
  Filled 2016-10-26 (×7): qty 1

## 2016-10-26 NOTE — Plan of Care (Signed)
Problem: Health Behavior/Discharge Planning: Goal: Compliance with prescribed medication regimen will improve Outcome: Progressing Pt was compliant with scheduled medications tonight.

## 2016-10-26 NOTE — Progress Notes (Signed)
DAR NOTE: Patient presents with anxious affect and depressed mood.  Denies pain, auditory and visual hallucinations.  Described energy level as low and concentration as good.  Rates depression at 2, hopelessness at 2, and anxiety at 5.  Maintained on routine safety checks.  Medications given as prescribed.  Support and encouragement offered as needed.  Attended group and participated.  States goal for today is "continue to work on mindfulness in present."  Patient was visible in milieu.  Minimal interaction with staff and peers.

## 2016-10-26 NOTE — Progress Notes (Signed)
Inpatient Diabetes Program Recommendations  AACE/ADA: New Consensus Statement on Inpatient Glycemic Control (2015)  Target Ranges:  Prepandial:   less than 140 mg/dL      Peak postprandial:   less than 180 mg/dL (1-2 hours)      Critically ill patients:  140 - 180 mg/dL   Results for Miranda [name], Miranda [name] (MRN [id number]) as of 10/26/2016 09:02  Ref. Range 10/25/2016 06:25 10/25/2016 12:02 10/25/2016 12:58 10/25/2016 16:55 10/25/2016 21:03  Glucose-Capillary Latest Ref Range: 65 - 99 mg/dL 140 (H) 56 (L) 87 102 (H) 73   Results for Miranda [name], Miranda [name] (MRN [id number]) as of 10/26/2016 09:02  Ref. Range 10/26/2016 06:10  Glucose-Capillary Latest Ref Range: 65 - 99 mg/dL 124 (H)    Home DM Meds: Metformin 1000 mg BID                             Actos 45 mg daily                             70/30 Insulin- 70 units AM/ 30 units PM  Current Insulin Orders: Metformin 1000 mg BID                                       Actos 45 mg daily                                       70/30 Insulin- 60 units AM/ 20 units PM      MD- Note 70/30 Insulin reduced yesterday, but not before pt received 65 units 70/30 with breakfast.  Mild Hypoglycemic event at 12pm yesterday.  Patient will receive reduced AM dose of 70/30 insulin today.  Would like to review 12 pm CBG today before making further insulin recommendations.      --Will follow patient during hospitalization--  Wyn Quaker RN, MSN, CDE Diabetes Coordinator Inpatient Glycemic Control Team Team Pager: [phone] (8a-5p)

## 2016-10-26 NOTE — [facility] Group Notes (Signed)
[place] LCSW Group Therapy  10/26/2016 4:12 PM  Type of Therapy:  Group Therapy  Participation Level:  Active  Participation Quality:  Attentive  Affect:  Appropriate  Cognitive:  Confused  Insight:  Improving  Engagement in Therapy:  Improving  Modes of Intervention:  Discussion, Education, Socialization and Support  Summary of Progress/Problems: Feelings around Relapse. Group members discussed the meaning of relapse and shared personal stories of relapse, how it affected them and others, and how they perceived themselves during this time. Group members were encouraged to identify triggers, warning signs and coping skills used when facing the possibility of relapse. Social supports were discussed and explored in detail. Pt attended group and stayed the entire time. She states she does not know what caused her relapse. She believes something may have happened and she is tryin to piece together memories.   [place] MSW, LCSWA  10/26/2016, 4:12 PM

## 2016-10-26 NOTE — Progress Notes (Signed)
[facility] MD Progress Note  10/26/2016 4:00 PM Miranda [name]  MRN:  [id number]  Subjective: Pt states " I feel ok , I think my medications are working. I want to go to [facility] , I have a nbed waiting for me.'    Objective: Patient seen and chart reviewed.Discussed patient with treatment team.  I have also reviewed notes . Pt today continues to have thought blocking - although improving. Pt continues to have her alter others that she talks to - however states that is chronic and she is able to cope. Pt tolerating her abilify - added yesterday. CSW to discuss her referral to [facility] with her therapist . Pt continues to need support.    Principal Problem: PTSD (post-traumatic stress disorder) Diagnosis:   Patient Active Problem List   Diagnosis Date Noted  . PTSD (post-traumatic stress disorder) [F43.10] 10/25/2016  . 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 Diabetes mellitus ([place]) [E11.9] 10/25/2016  . Major depressive disorder, recurrent episode, severe, with psychosis ([place]) [F33.3] 10/20/2016  . Major depressive disorder, recurrent episode, severe, 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[place]) [F33.3] 10/19/2016  . TIA (transient ischemic attack) [G45.9] 05/16/2016  . Arterial ischemic stroke, MCA, left, acute ([place]) [I63.512] 03/29/2016  . Plantar fasciitis [M72.2] 08/08/2013  . Obesity, Class III, BMI 40-49.9 (morbid obesity) ([place]) [E66.01] 04/09/2013  . Diabetes ([place]) [E11.9] 03/31/2013  . Unspecified sleep apnea [G47.30] 03/31/2013  . PCO (polycystic ovaries) [E28.2] 04/11/2012  . NECK PAIN [M54.2] 11/21/2010  . FATTY LIVER DISEASE [K76.89] 01/26/2010  . NAUSEA WITH VOMITING [R11.2] 01/26/2010  . ABDOMINAL PAIN, EPIGASTRIC [R10.13] 01/26/2010  . PORTAL HYPERTENSION [K76.6] 01/26/2010  . ABDOMINAL PAIN, RIGHT UPPER QUADRANT [R10.11] 12/27/2009  . Migraine headache [G43.909] 06/28/2009  . ACUTE BRONCHITIS [J20.9] 06/08/2009  . ABDOMINAL PAIN, RIGHT LOWER QUADRANT  [R10.31] 11/24/2008  . BREAST MASS [N63.0] 08/21/2008  . KNEE PAIN [M25.569] 04/14/2008  . Hypothyroidism [E03.9] 03/17/2008  . Hyperlipidemia [E78.5] 03/17/2008  . DEPRESSION [F32.9] 03/17/2008  . NEUROPATHY [G58.9] 03/17/2008  . Essential hypertension [I10] 03/17/2008  . ASTHMA [J45.909] 03/17/2008  . GERD [K21.9] 03/17/2008  . Headache [R51] 03/17/2008   Total Time spent with patient:  15 minutes   Past Psychiatric History:Please see H&P. - pt with hx of PTSD , dissociative identity do , MDD - currently has a provider on an out patient basis- pt reports she was referred to [facility] - and was awaiting placement prior to this admission. Pt has been admitted to [facility] in the past- 2011. Pt reports past admission to [facility].  Pt reports hx of suicide attempts as well as self mutilating behavior in the past.  Past Medical History: hx of recent strokes few months ago. Past Medical History:  Diagnosis Date  . Anxiety   . Arthritis   . Asthma   . Bowel obstruction   . Cardiac arrhythmia   . Depression    sees Dr. Matilde Haymaker in [place]   . Diabetes mellitus    type 2, on insulin pump, sees Dr. Loanne Drilling   . GERD (gastroesophageal reflux disease)   . Hyperlipidemia   . Hypertension   . Hypothyroidism    sees Dr. Elyse Hsu  . Insomnia   . Migraine syndrome   . Morbid obesity ([place])   . Multiple personality   . Neck pain   . Neuropathy associated with endocrine disorder ([place])   . Sleep apnea with use of continuous positive airway pressure (CPAP)    on CPAP  .  Stroke [facility]) 03-25-16   left MCA   . Stroke [facility]) 04/2016    Past Surgical History:  Procedure Laterality Date  . blocked itestinal repair  44   age 72 months  . CHOLECYSTECTOMY  2011  . INDUCED ABORTION  1996   forced abortion  . INGUINAL HERNIA REPAIR Left 1980   age 15  . LAPAROSCOPIC ENDOMETRIOSIS FULGURATION  1998  . WISDOM TOOTH EXTRACTION  2007   Family History:  Family History  Problem  Relation Age of Onset  . Adopted: Yes  . Heart disease      on both sides of family  . Diabetes      on both sides of family   Family Psychiatric  History: reports she was adopted and does not know her biological family - denies any hx of mental illness in her adoptive family as far as she knows.  Social History: Pt lives with room mates in [place] - friends or room mates are supportive . Pt is currently divorced - denies having children. History  Alcohol Use No     History  Drug Use No    Social History   Social History  . Marital status: Married    Spouse name: N/A  . Number of children: 5  . Years of education: N/A   Occupational History  . Nurse, learning disability    Social History Main Topics  . Smoking status: Former Smoker    Packs/day: 0.00    Types: Cigarettes  . Smokeless tobacco: Never Used     Comment: quit 03/25/2016  . Alcohol use No  . Drug use: No  . Sexual activity: Yes   Other Topics Concern  . None   Social History Narrative  . None   Additional Social History:    Pain Medications: See MAR Prescriptions: See MAR Over the Counter: See MAR History of alcohol / drug use?: Yes Name of Substance 1: Cigarettes 1 - Age of First Use: UTA 1 - Amount (size/oz): Pt cargiver, it depends on the pt's current personality.  1 - Frequency: UTA 1 - Duration: UTA 1 - Last Use / Amount: UTA  Sleep: Fair  Appetite:  Fair  Current Medications: Current Facility-Administered Medications  Medication Dose Route Frequency Provider Last Rate Last Dose  . acetaminophen (TYLENOL) tablet 650 mg  650 mg Oral Q6H PRN Patrecia Pour, NP   650 mg at 10/24/16 2003  . albuterol (PROVENTIL HFA;VENTOLIN HFA) 108 (90 Base) MCG/ACT inhaler 2 puff  2 puff Inhalation Q4H PRN Patrecia Pour, NP      . alum & mag hydroxide-simeth (MAALOX/MYLANTA) 200-200-20 MG/5ML suspension 30 mL  30 mL Oral Q4H PRN Patrecia Pour, NP   30 mL at 10/24/16 2219  . ARIPiprazole  (ABILIFY) tablet 10 mg  10 mg Oral QHS Baileigh Modisette, MD      . benztropine (COGENTIN) tablet 0.5 mg  0.5 mg Oral Q6H PRN Ursula Alert, MD       Or  . benztropine mesylate (COGENTIN) injection 0.5 mg  0.5 mg Intramuscular Q6H PRN Nora Rooke, MD      . buPROPion (WELLBUTRIN XL) 24 hr tablet 300 mg  300 mg Oral Daily Patrecia Pour, NP   300 mg at 10/26/16 0829  . clopidogrel (PLAVIX) tablet 75 mg  75 mg Oral Daily Patrecia Pour, NP   75 mg at 10/26/16 0829  . haloperidol (HALDOL) tablet 5 mg  5 mg Oral Q8H PRN Renuka Farfan  Tamberlyn Midgley, MD       Or  . haloperidol lactate (HALDOL) injection 5 mg  5 mg Intramuscular Q8H PRN Jesiah Yerby, MD      . insulin aspart protamine- aspart (NOVOLOG MIX 70/30) injection 20 Units  20 Units Subcutaneous Q supper Ursula Alert, MD   20 Units at 10/25/16 1833  . insulin aspart protamine- aspart (NOVOLOG MIX 70/30) injection 60 Units  60 Units Subcutaneous Q breakfast Ursula Alert, MD   60 Units at 10/26/16 0834  . levothyroxine (SYNTHROID, LEVOTHROID) tablet 125 mcg  125 mcg Oral QAC breakfast Patrecia Pour, NP   125 mcg at 10/26/16 [id number]  . loperamide (IMODIUM) capsule 4 mg  4 mg Oral PRN Patrecia Pour, NP   4 mg at 10/21/16 0849  . loratadine (CLARITIN) tablet 10 mg  10 mg Oral Daily Ursula Alert, MD   10 mg at 10/26/16 0829  . LORazepam (ATIVAN) injection 1 mg  1 mg Intramuscular Q6H PRN Ursula Alert, MD       Or  . LORazepam (ATIVAN) tablet 1 mg  1 mg Oral Q6H PRN Chrisopher Pustejovsky, MD      . magnesium hydroxide (MILK OF MAGNESIA) suspension 30 mL  30 mL Oral Daily PRN Patrecia Pour, NP   30 mL at 10/25/16 0812  . metFORMIN (GLUCOPHAGE) tablet 1,000 mg  1,000 mg Oral BID WC Patrecia Pour, NP   1,000 mg at 10/26/16 [id number]  . metoprolol succinate (TOPROL-XL) 24 hr tablet 12.5 mg  12.5 mg Oral Daily Patrecia Pour, NP   12.5 mg at 10/26/16 [id number]  . ondansetron (ZOFRAN-ODT) disintegrating tablet 4 mg  4 mg Oral Q6H PRN Encarnacion Slates, NP   4 mg at 10/24/16 0315   . pantoprazole (PROTONIX) EC tablet 40 mg  40 mg Oral Daily Patrecia Pour, NP   40 mg at 10/26/16 [id number]  . pioglitazone (ACTOS) tablet 45 mg  45 mg Oral Daily Patrecia Pour, NP   45 mg at 10/26/16 [id number]  . rosuvastatin (CRESTOR) tablet 20 mg  20 mg Oral Daily Patrecia Pour, NP   20 mg at 10/26/16 [id number]  . sertraline (ZOLOFT) tablet 125 mg  125 mg Oral Daily Ursula Alert, MD   125 mg at 10/26/16 0830  . topiramate (TOPAMAX) tablet 100 mg  100 mg Oral BID Patrecia Pour, NP   100 mg at 10/26/16 [id number]  . traZODone (DESYREL) tablet 125 mg  125 mg Oral QHS Ursula Alert, MD   125 mg at 10/25/16 2129    Lab Results:  Results for orders placed or performed during the hospital encounter of 10/20/16 (from the past 48 hour(s))  Glucose, capillary     Status: None   Collection Time: 10/24/16  5:05 PM  Result Value Ref Range   Glucose-Capillary 81 65 - 99 mg/dL   Comment 1 Notify RN    Comment 2 Document in Chart   Glucose, capillary     Status: Abnormal   Collection Time: 10/24/16  8:08 PM  Result Value Ref Range   Glucose-Capillary 152 (H) 65 - 99 mg/dL  Glucose, capillary     Status: Abnormal   Collection Time: 10/25/16  6:25 AM  Result Value Ref Range   Glucose-Capillary 140 (H) 65 - 99 mg/dL  Glucose, capillary     Status: Abnormal   Collection Time: 10/25/16 12:02 PM  Result Value Ref Range   Glucose-Capillary 56 (L) 65 - 99 mg/dL  Comment 1 Notify RN    Comment 2 Document in Chart   Glucose, capillary     Status: None   Collection Time: 10/25/16 12:58 PM  Result Value Ref Range   Glucose-Capillary 87 65 - 99 mg/dL   Comment 1 Notify RN    Comment 2 Document in Chart   Glucose, capillary     Status: Abnormal   Collection Time: 10/25/16  4:55 PM  Result Value Ref Range   Glucose-Capillary 102 (H) 65 - 99 mg/dL  Glucose, capillary     Status: None   Collection Time: 10/25/16  9:03 PM  Result Value Ref Range   Glucose-Capillary 73 65 - 99 mg/dL  Glucose, capillary     Status:  Abnormal   Collection Time: 10/26/16  6:10 AM  Result Value Ref Range   Glucose-Capillary 124 (H) 65 - 99 mg/dL  Lipid panel     Status: None   Collection Time: 10/26/16  6:20 AM  Result Value Ref Range   Cholesterol 110 0 - 200 mg/dL   Triglycerides 91 <150 mg/dL   HDL 47 >40 mg/dL   Total CHOL/HDL Ratio 2.3 RATIO   VLDL 18 0 - 40 mg/dL   LDL Cholesterol 45 0 - 99 mg/dL    Comment:        Total Cholesterol/HDL:CHD Risk Coronary Heart Disease Risk Table                     Men   Women  1/2 Average Risk   3.4   3.3  Average Risk       5.0   4.4  2 X Average Risk   9.6   7.1  3 X Average Risk  23.4   11.0        Use the calculated Patient Ratio above and the CHD Risk Table to determine the patient's CHD Risk.        ATP III CLASSIFICATION (LDL):  <100     mg/dL   Optimal  100-129  mg/dL   Near or Above                    Optimal  130-159  mg/dL   Borderline  160-189  mg/dL   High  >190     mg/dL   Very High Performed at [facility]   TSH     Status: None   Collection Time: 10/26/16  6:20 AM  Result Value Ref Range   TSH 0.752 0.350 - 4.500 uIU/mL    Comment: Performed by a 3rd Generation assay with a functional sensitivity of <=0.01 uIU/mL. Performed at [facility]   Glucose, capillary     Status: None   Collection Time: 10/26/16 11:53 AM  Result Value Ref Range   Glucose-Capillary 73 65 - 99 mg/dL   Comment 1 Notify RN    Comment 2 Document in Chart     Blood Alcohol level:  Lab Results  Component Value Date   ETH <5 [id number]    Metabolic Disorder Labs: Lab Results  Component Value Date   HGBA1C 9.9 08/05/2015   No results found for: PROLACTIN Lab Results  Component Value Date   CHOL 110 10/26/2016   TRIG 91 10/26/2016   HDL 47 10/26/2016   CHOLHDL 2.3 10/26/2016   VLDL 18 10/26/2016   LDLCALC 45 10/26/2016   LDLCALC 25 04/28/2013    Physical Findings: AIMS: Facial and Oral Movements Muscles of  Facial Expression:  None, normal Lips and Perioral Area: None, normal Jaw: None, normal Tongue: None, normal,Extremity Movements Upper (arms, wrists, hands, fingers): None, normal Lower (legs, knees, ankles, toes): None, normal, Trunk Movements Neck, shoulders, hips: None, normal, Overall Severity Severity of abnormal movements (highest score from questions above): None, normal Incapacitation due to abnormal movements: None, normal Patient's awareness of abnormal movements (rate only patient's report): No Awareness, Dental Status Current problems with teeth and/or dentures?: No Does patient usually wear dentures?: No  CIWA:    COWS:     Musculoskeletal: Strength & Muscle Tone: within normal limits Gait & Station: normal Patient leans: N/A  Psychiatric Specialty Exam: Physical Exam  Nursing note and vitals reviewed.   Review of Systems  Psychiatric/Behavioral: Positive for depression, hallucinations and substance abuse. The patient has insomnia.   All other systems reviewed and are negative.   Blood pressure 107/66, pulse 74, temperature 98.8 F (37.1 C), temperature source Oral, resp. rate 16, height 5\' 1"  (1.549 m), weight 115.4 kg (254 lb 8 oz), SpO2 99 %.Body mass index is 48.09 kg/m.  General Appearance: Guarded  Eye Contact:  Fair  Speech:  Blocked and Slow  Volume:  Decreased  Mood:  Depressed  Affect:  Congruent  Thought Process:  Goal Directed and Descriptions of Associations: Circumstantial  Orientation:  Full (Time, Place, and Person)  Thought Content:  Delusions, Paranoid Ideation and Rumination talks to her other alters - states it is chronic   Suicidal Thoughts:  No  Homicidal Thoughts:  No   Memory:  Immediate;   Fair Recent;   Fair Remote;   Fair  Judgement:  Fair  Insight: fair  Psychomotor Activity: slow  Concentration:  Concentration: Fair and Attention Span: Fair  Recall:  [employer] of Knowledge:  Fair  Language:  Fair  Akathisia:  No  Handed:  Right  AIMS (if  indicated):     Assets:  Communication Skills Desire for Improvement  ADL's:  Intact  Cognition:  WNL  Sleep:  Number of Hours: 4.75   Assessment - Patient today seen as less anxious - thought blocking improved since yesterday - reports she is better and she does not think her talking to her alters are hallucinations. Pt wants to be transitioned to [facility] - states she has a bed waiting. CSW to work on this .   PTSD (post-traumatic stress disorder) unstable - improving  Will continue today 10/26/16  plan as below except where it is noted.   Treatment Plan Summary: Daily contact with patient to assess and evaluate symptoms and progress in treatment and Medication management   For PTSD sx: Continue Wellbutrin XL 300 mg po daily. Increased Zoloft to 125 mg po daily.  For psychosis: Continue cross titrating Risperidone with Abilify. Risperidone tapered down due to hx of hyperglycemia /DM.  For sleep issues: Increased Trazodone to 125 mg po qhs.  For DM: Continue medications as per MAR. Make changes as per Diabetic consult recommendations.  For depressive sx: Wellbutrin 300 mg po daily. Zoloft 125 mg po daily.  Reviewed  lipid panel- wnl , pending hba1c, pl .  Restarted home medications as indicated.  CSW will continue to work on disposition.   Sevyn Paredez, MD 10/26/2016, 4:00 PM  Patient ID: Reece Packer, female   DOB: 1976/04/12, 40 y.o.   MRN: [id number]

## 2016-10-26 NOTE — Progress Notes (Signed)
Inpatient Diabetes Program Recommendations  AACE/ADA: New Consensus Statement on Inpatient Glycemic Control (2015)  Target Ranges:  Prepandial:   less than 140 mg/dL      Peak postprandial:   less than 180 mg/dL (1-2 hours)      Critically ill patients:  140 - 180 mg/dL      MD- After review of yesterday and today's glucose levels, please consider reducing 70/30 Insulin doses to the following amounts:  55 units AM/ 15 units PM     --Will follow patient during hospitalization--  Wyn Quaker RN, MSN, CDE Diabetes Coordinator Inpatient Glycemic Control Team Team Pager: [phone] (8a-5p)

## 2016-10-26 NOTE — Progress Notes (Signed)
Recreation Therapy Notes  Date: 10/26/16 Time: 1000 Location: 500 Hall Dayroom  Group Topic: Communication  Goal Area(s) Addresses:  Patient will effectively communicate with peers in group.  Patient will verbalize benefit of healthy communication. Patient will verbalize positive effect of healthy communication on post d/c goals.  Patient will identify communication techniques that made activity effective for group.   Behavioral Response: Engaged  Intervention: Academic librarian, chairs  Activity: Keep It Chartered certified accountant.  Patients were arranged in a circle.  Patients were to hit a giant beach ball to each like you would in volleyball.  Patients were to hit the ball as many times as they could without it rolling to a stop on the floor.  The ball could bounce off of the floor but not stop.  The group was trying to beat the record set by a previous group of 650.  Patients were to use teamwork, communication and strategies to complete the activity.  Education: Communication, Discharge Planning  Education Outcome: Acknowledges understanding/In group clarification offered/Needs additional education.   Clinical Observations/Feedback:  Pt was smiling, bright and very active during group.  Pt was social with peers and seemed to be enjoying the activity.   Victorino Sparrow, LRT/CTRS       Victorino Sparrow A 10/26/2016 12:11 PM

## 2016-10-26 NOTE — Progress Notes (Signed)
Pt attended Music Therapy this evening.  

## 2016-10-27 LAB — HEMOGLOBIN A1C
HEMOGLOBIN A1C: 7.8 % — AB (ref 4.8–5.6)
Mean Plasma Glucose: 177 mg/dL

## 2016-10-27 LAB — GLUCOSE, CAPILLARY
GLUCOSE-CAPILLARY: 108 mg/dL — AB (ref 65–99)
Glucose-Capillary: 89 mg/dL (ref 65–99)

## 2016-10-27 LAB — PROLACTIN: PROLACTIN: 45.8 ng/mL — AB (ref 4.8–23.3)

## 2016-10-27 MED ORDER — METOPROLOL SUCCINATE ER 25 MG PO TB24: 12.5000 mg | ORAL_TABLET | Freq: Every day | ORAL | 0 refills | Status: AC

## 2016-10-27 MED ORDER — BUPROPION HCL ER (XL) 300 MG PO TB24: 300.0000 mg | ORAL_TABLET | Freq: Every day | ORAL | 0 refills | Status: DC

## 2016-10-27 MED ORDER — SERTRALINE HCL 25 MG PO TABS: 125.0000 mg | ORAL_TABLET | Freq: Every day | ORAL | 0 refills | Status: DC

## 2016-10-27 MED ORDER — ARIPIPRAZOLE 10 MG PO TABS: 10.0000 mg | ORAL_TABLET | Freq: Every day | ORAL | 0 refills | Status: DC

## 2016-10-27 MED ORDER — LEVOTHYROXINE SODIUM 125 MCG PO TABS: 125.0000 ug | ORAL_TABLET | Freq: Every day | ORAL | 0 refills | Status: DC

## 2016-10-27 MED ORDER — TRAZODONE HCL 100 MG PO TABS: 100.0000 mg | ORAL_TABLET | Freq: Every day | ORAL | 0 refills | Status: DC

## 2016-10-27 MED ORDER — METFORMIN HCL 1000 MG PO TABS: 1000.0000 mg | ORAL_TABLET | Freq: Two times a day (BID) | ORAL | 0 refills | Status: DC

## 2016-10-27 MED ORDER — TRAZODONE HCL 100 MG PO TABS
100.0000 mg | ORAL_TABLET | Freq: Every day | ORAL | Status: DC
  Filled 2016-10-27 (×2): qty 1

## 2016-10-27 MED ORDER — INSULIN ASPART PROT & ASPART (70-30 MIX) 100 UNIT/ML ~~LOC~~ SUSP: 55.0000 [IU] | Freq: Every day | SUBCUTANEOUS | 0 refills | Status: DC

## 2016-10-27 MED ORDER — PIOGLITAZONE HCL 45 MG PO TABS: 45.0000 mg | ORAL_TABLET | Freq: Every day | ORAL | 0 refills | Status: DC

## 2016-10-27 NOTE — Progress Notes (Signed)
  [facility] Adult Case Management Discharge Plan :  Will you be returning to the same living situation after discharge:  Yes,  home  At discharge, do you have transportation home?: Yes,  roommate  Do you have the ability to pay for your medications: Yes,  insurance   Release of information consent forms completed and in the chart;  Patient's signature needed at discharge.  Patient to Follow up at: Follow-up Information    Dr. Pervis Hocking. Schedule an appointment as soon as possible for a visit.   Why:  Please call to schedule appointment as soon as possible.  Contact information: [place] at [address]  [address]  [place], [place] [address] [phone].        [place] Follow up on 10/31/2016.   Specialty:  Behavioral Health Why:  Medication management appointment on Tuesday Jan. 2nd at 9:30am.  Contact information: [place] [address] [phone]           Next level of care provider has access to [place] and Suicide Prevention discussed: Yes,  with patient   Have you used any form of tobacco in the last 30 days? (Cigarettes, Smokeless Tobacco, Cigars, and/or Pipes): No  Has patient been referred to the Quitline?: N/A patient is not a smoker  Patient has been referred for addiction treatment: [facility] MSW, [place]  10/27/2016, 1:15 PM

## 2016-10-27 NOTE — Progress Notes (Signed)
Recreation Therapy Notes  Date: 10/27/16 Time: 1000 Location: 500 Hall Dayroom  Group Topic: Coping Skills  Goal Area(s) Addresses:  Patient will be able to identify positive coping skills. Patient will be able to identify benefits of coping skills. Patient will be able to identify benefits of using coping skills post d/c.  Behavioral Response: Engaged  Intervention: Can with various coping skills in it, dry erase maker, dry erase board  Activity: Landscape architect.  LRT had a can with various types of coping skills.  A patient would come to the board, pick a slip of paper from the can and draw whatever is on the paper on the board.  The remaining patients would attempt to guess what the patient is drawing.  The person that guesses correctly would go next.  Education: Radiographer, therapeutic, Dentist.   Education Outcome: Acknowledges understanding/In group clarification offered/Needs additional education.   Clinical Observations/Feedback: Pt was active during group.  Pt was bright and laughing with peers.  Pt did express the activity made her a little stressed because she had to think about how to draw what was on the paper.      Victorino Sparrow, LRT/CTRS      Victorino Sparrow A 10/27/2016 12:56 PM

## 2016-10-27 NOTE — Progress Notes (Signed)
D: Pt was in the day room talking to peer upon initial approach.  Pt presents with depressed affect and mood.  Her goal is to "be mindful, stay present, stay awake."  Pt reports she accomplished her goals.  Pt denies SI/HI, denies hallucinations, reports right ear pain of 6/10.  Pt reports she is "going to a trauma center" when she leaves [place].  Pt reports she feels safe with this plan.  Pt has been visible in milieu interacting with peers and staff appropriately.  Pt attended evening group and sang during music therapy.   A: Actively listened to pt and offered support and encouragement. Medications administered per order.  PRN medication offered for pain, pt declined.   R: Pt is safe on the unit.  Pt is compliant with medications.  Pt verbally contracts for safety.  Will continue to monitor and assess.

## 2016-10-27 NOTE — Progress Notes (Signed)
Patient A/O, no noted distress. Denies AVH. Patient noted depressed and withdrawn. She rates her depression 4/10. She is in compliance with medication. She is out of room in dayroom. Administered prn regimen for anxiety,  See flow sheet. Patient noted she did not sleep well: awaken during the night and nightmares. Patient noted she continues to have a poor appetite Patient verbal contract to safety. Staff will continue to monitor and meet needs.

## 2016-10-27 NOTE — BHH Suicide Risk Assessment (Signed)
[facility] Discharge Suicide Risk Assessment   Principal Problem: PTSD (post-traumatic stress disorder) Discharge Diagnoses:  Patient Active Problem List   Diagnosis Date Noted  . PTSD (post-traumatic stress disorder) [F43.10] 10/25/2016  . 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 Diabetes mellitus ([place]) [E11.9] 10/25/2016  . Major depressive disorder, recurrent episode, severe, with psychosis ([place]) [F33.3] 10/20/2016  . Major depressive disorder, recurrent episode, severe, 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[place]) [F33.3] 10/19/2016  . TIA (transient ischemic attack) [G45.9] 05/16/2016  . Arterial ischemic stroke, MCA, left, acute ([place]) [I63.512] 03/29/2016  . Plantar fasciitis [M72.2] 08/08/2013  . Obesity, Class III, BMI 40-49.9 (morbid obesity) ([place]) [E66.01] 04/09/2013  . Diabetes ([place]) [E11.9] 03/31/2013  . Unspecified sleep apnea [G47.30] 03/31/2013  . PCO (polycystic ovaries) [E28.2] 04/11/2012  . NECK PAIN [M54.2] 11/21/2010  . FATTY LIVER DISEASE [K76.89] 01/26/2010  . NAUSEA WITH VOMITING [R11.2] 01/26/2010  . ABDOMINAL PAIN, EPIGASTRIC [R10.13] 01/26/2010  . PORTAL HYPERTENSION [K76.6] 01/26/2010  . ABDOMINAL PAIN, RIGHT UPPER QUADRANT [R10.11] 12/27/2009  . Migraine headache [G43.909] 06/28/2009  . ACUTE BRONCHITIS [J20.9] 06/08/2009  . ABDOMINAL PAIN, RIGHT LOWER QUADRANT [R10.31] 11/24/2008  . BREAST MASS [N63.0] 08/21/2008  . KNEE PAIN [M25.569] 04/14/2008  . Hypothyroidism [E03.9] 03/17/2008  . Hyperlipidemia [E78.5] 03/17/2008  . DEPRESSION [F32.9] 03/17/2008  . NEUROPATHY [G58.9] 03/17/2008  . Essential hypertension [I10] 03/17/2008  . ASTHMA [J45.909] 03/17/2008  . GERD [K21.9] 03/17/2008  . Headache [R51] 03/17/2008    Total Time spent with patient: 30 minutes  Musculoskeletal: Strength & Muscle Tone: within normal limits Gait & Station: normal Patient leans: N/A  Psychiatric Specialty Exam: Review of Systems  Psychiatric/Behavioral: Positive for  depression (improved). Negative for suicidal ideas. The patient is nervous/anxious (improved).   All other systems reviewed and are negative.   Blood pressure 109/66, pulse 90, temperature 98.3 F (36.8 C), resp. rate 18, height 5\' 1"  (1.549 m), weight 115.4 kg (254 lb 8 oz), SpO2 99 %.Body mass index is 48.09 kg/m.  General Appearance: Casual  Eye Contact::  Fair  Speech:  Clear and Coherent409  Volume:  Normal  Mood:  Euthymic  Affect:  Appropriate  Thought Process:  Goal Directed and Descriptions of Associations: Intact  Orientation:  Full (Time, Place, and Person)  Thought Content:  Logical  Suicidal Thoughts:  No  Homicidal Thoughts:  No  Memory:  Immediate;   Fair Recent;   Fair Remote;   Fair  Judgement:  Fair  Insight:  Fair  Psychomotor Activity:  Normal  Concentration:  Fair  Recall:  [employer] of Knowledge:Fair  Language: Fair  Akathisia:  No  Handed:  Right  AIMS (if indicated):     Assets:  Desire for Improvement  Sleep:  Number of Hours: 6.5  Cognition: WNL  ADL's:  Intact   Mental Status Per Nursing Assessment::   On Admission:  NA  Demographic Factors:  Caucasian  Loss Factors: NA  Historical Factors: Impulsivity  Risk Reduction Factors:   Positive social support and Positive therapeutic relationship  Continued Clinical Symptoms:  Previous Psychiatric Diagnoses and Treatments Medical Diagnoses and Treatments/Surgeries  Cognitive Features That Contribute To Risk:  None    Suicide Risk:  Minimal: No identifiable suicidal ideation.  Patients presenting with no risk factors but with morbid ruminations; may be classified as minimal risk based on the severity of the depressive symptoms  Follow-up Information    Dr. Pervis Hocking Follow up.   Why:  Your Education officer, museum will call and confirm your  next appointment date/time. Contact information: [place] at [address]  [address]  [place], [place]  [address] [phone].           Plan Of Care/Follow-up recommendations:  Activity:  no restrictions Diet:  carb modified Other:  patient has bed waiting at [facility] long term care - plan to follow up with them-.  Amory Simonetti, MD 10/27/2016, 9:05 AM

## 2016-10-27 NOTE — Discharge Summary (Signed)
Physician Discharge Summary Note  Patient:  Miranda [name] is an 40 y.o., female MRN:  [id number] DOB:  August 22, 1976 Patient phone:  [phone] (home)  Patient address:   [address] [place] [address],  Total Time spent with patient: 30 minutes  Date of Admission:  10/20/2016 Date of Discharge: 10/27/2016  Reason for Admission:  Altered mental status  Principal Problem: PTSD (post-traumatic stress disorder) Discharge Diagnoses: Patient Active Problem List   Diagnosis Date Noted  . PTSD (post-traumatic stress disorder) [F43.10] 10/25/2016  . 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 Diabetes mellitus ([place]) [E11.9] 10/25/2016  . Major depressive disorder, recurrent episode, severe, with psychosis ([place]) [F33.3] 10/20/2016  . Major depressive disorder, recurrent episode, severe, 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[place]) [F33.3] 10/19/2016  . TIA (transient ischemic attack) [G45.9] 05/16/2016  . Arterial ischemic stroke, MCA, left, acute ([place]) [I63.512] 03/29/2016  . Plantar fasciitis [M72.2] 08/08/2013  . Obesity, Class III, BMI 40-49.9 (morbid obesity) ([place]) [E66.01] 04/09/2013  . Diabetes ([place]) [E11.9] 03/31/2013  . Unspecified sleep apnea [G47.30] 03/31/2013  . PCO (polycystic ovaries) [E28.2] 04/11/2012  . NECK PAIN [M54.2] 11/21/2010  . FATTY LIVER DISEASE [K76.89] 01/26/2010  . NAUSEA WITH VOMITING [R11.2] 01/26/2010  . ABDOMINAL PAIN, EPIGASTRIC [R10.13] 01/26/2010  . PORTAL HYPERTENSION [K76.6] 01/26/2010  . ABDOMINAL PAIN, RIGHT UPPER QUADRANT [R10.11] 12/27/2009  . Migraine headache [G43.909] 06/28/2009  . ACUTE BRONCHITIS [J20.9] 06/08/2009  . ABDOMINAL PAIN, RIGHT LOWER QUADRANT [R10.31] 11/24/2008  . BREAST MASS [N63.0] 08/21/2008  . KNEE PAIN [M25.569] 04/14/2008  . Hypothyroidism [E03.9] 03/17/2008  . Hyperlipidemia [E78.5] 03/17/2008  . DEPRESSION [F32.9] 03/17/2008  . NEUROPATHY [G58.9] 03/17/2008  . Essential hypertension [I10] 03/17/2008  .  ASTHMA [J45.909] 03/17/2008  . GERD [K21.9] 03/17/2008  . Headache [R51] 03/17/2008    Past Psychiatric History: see HPI  Past Medical History:  Past Medical History:  Diagnosis Date  . Anxiety   . Arthritis   . Asthma   . Bowel obstruction   . Cardiac arrhythmia   . Depression    sees Dr. Matilde Haymaker in [place]   . Diabetes mellitus    type 2, on insulin pump, sees Dr. Loanne Drilling   . GERD (gastroesophageal reflux disease)   . Hyperlipidemia   . Hypertension   . Hypothyroidism    sees Dr. Elyse Hsu  . Insomnia   . Migraine syndrome   . Morbid obesity ([place])   . Multiple personality   . Neck pain   . Neuropathy associated with endocrine disorder ([place])   . Sleep apnea with use of continuous positive airway pressure (CPAP)    on CPAP  . Stroke [facility]) 03-25-16   left MCA   . Stroke [facility]) 04/2016    Past Surgical History:  Procedure Laterality Date  . blocked itestinal repair  30   age 24 months  . CHOLECYSTECTOMY  2011  . INDUCED ABORTION  1996   forced abortion  . INGUINAL HERNIA REPAIR Left 1980   age 26  . LAPAROSCOPIC ENDOMETRIOSIS FULGURATION  1998  . WISDOM TOOTH EXTRACTION  2007   Family History:  Family History  Problem Relation Age of Onset  . Adopted: Yes  . Heart disease      on both sides of family  . Diabetes      on both sides of family   Family Psychiatric  History: see HPI Social History:  History  Alcohol Use No     History  Drug Use No    Social  History   Social History  . Marital status: Married    Spouse name: N/A  . Number of children: 5  . Years of education: N/A   Occupational History  . Nurse, learning disability    Social History Main Topics  . Smoking status: Former Smoker    Packs/day: 0.00    Types: Cigarettes  . Smokeless tobacco: Never Used     Comment: quit 03/25/2016  . Alcohol use No  . Drug use: No  . Sexual activity: Yes   Other Topics Concern  . None   Social History Narrative  . None     Hospital Course:  Miranda [name] an 40 y.o.female, who presented involuntarily and unaccompanied to [facility]. Pt was a poor historian with altered mental status.  Patient has a caregiver and per admission note, patient is followed by Dr Rexene Edison (outpatient) and it had been documented that Miranda [name] has "170 documented personalities".  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  Patient was treated with medications with their indications listed below in detail under Medication List.  Medical problems were identified and treated as needed.  Home medications were restarted as appropriate.  Improvement was monitored by observation and Miranda [name] daily report of symptom reduction.  Emotional and mental status was monitored by daily self inventory reports completed by Miranda [name] and clinical staff.  Patient reported continued improvement, denied any new concerns.  Patient had been compliant on medications and denied side effects.  Support and encouragement was provided.         Miranda [name] was evaluated by the treatment team for stability and plans for continued recovery upon discharge.  Patient was offered further treatment options upon discharge including Residential, Intensive Outpatient and Outpatient treatment. Patient will follow up with agency listed below for medication management and counseling.  Encouraged patient to maintain satisfactory support network and home environment.  Advised to adhere to medication compliance and outpatient treatment follow up.  Prescriptions provided.       Miranda [name] motivation was an integral factor for scheduling further treatment.  Employment, transportation, bed availability, health status, family support, and any pending legal issues were also  considered during patient's hospital stay.  Upon completion of this admission the patient was both mentally and medically stable for discharge denying suicidal/homicidal ideation, auditory/visual/tactile hallucinations, delusional thoughts and paranoia.      Physical Findings: AIMS: Facial and Oral Movements Muscles of Facial Expression: None, normal Lips and Perioral Area: None, normal Jaw: None, normal Tongue: None, normal,Extremity Movements Upper (arms, wrists, hands, fingers): None, normal Lower (legs, knees, ankles, toes): None, normal, Trunk Movements Neck, shoulders, hips: None, normal, Overall Severity Severity of abnormal movements (highest score from questions above): None, normal Incapacitation due to abnormal movements: None, normal Patient's awareness of abnormal movements (rate only patient's report): No Awareness, Dental Status Current problems with teeth and/or dentures?: No Does patient usually wear dentures?: No  CIWA:    COWS:     Musculoskeletal: Strength & Muscle Tone: within normal limits Gait & Station: normal Patient leans: N/A  Psychiatric Specialty Exam:  SEE MD SRA Physical Exam  Nursing note and vitals reviewed. Psychiatric: She has a normal mood and affect. Her speech is normal and behavior is normal. Judgment and thought content normal. Cognition and memory are normal.  Review of Systems  Constitutional: Negative.   HENT: Negative.   Eyes: Negative.   Respiratory: Negative.   Cardiovascular: Negative.   Gastrointestinal: Negative.   Genitourinary: Negative.   Musculoskeletal: Negative.   Skin: Negative.   Neurological: Negative.   Endo/Heme/Allergies: Negative.   Psychiatric/Behavioral: Negative for depression, hallucinations, substance abuse and suicidal ideas. The patient is not nervous/anxious.   All other systems reviewed and are negative.   Blood pressure 109/66, pulse 90, temperature 98.3 F (36.8 C), resp. rate 18, height 5\' 1"   (1.549 m), weight 115.4 kg (254 lb 8 oz), SpO2 99 %.Body mass index is 48.09 kg/m.    Have you used any form of tobacco in the last 30 days? (Cigarettes, Smokeless Tobacco, Cigars, and/or Pipes): No  Has this patient used any form of tobacco in the last 30 days? (Cigarettes, Smokeless Tobacco, Cigars, and/or Pipes) Yes, NA  Blood Alcohol level:  Lab Results  Component Value Date   ETH <5 [id number]    Metabolic Disorder Labs:  Lab Results  Component Value Date   HGBA1C 7.8 (H) 10/26/2016   MPG 177 10/26/2016   Lab Results  Component Value Date   PROLACTIN 45.8 (H) 10/26/2016   Lab Results  Component Value Date   CHOL 110 10/26/2016   TRIG 91 10/26/2016   HDL 47 10/26/2016   CHOLHDL 2.3 10/26/2016   VLDL 18 10/26/2016   LDLCALC 45 10/26/2016   LDLCALC 25 04/28/2013    See Psychiatric Specialty Exam and Suicide Risk Assessment completed by Attending Physician prior to discharge.  Discharge destination:  Other:  [facility]  Is patient on multiple antipsychotic therapies at discharge:  No   Has Patient had three or more failed trials of antipsychotic monotherapy by history:  No  Recommended Plan for Multiple Antipsychotic Therapies: NA   Allergies as of 10/27/2016      Reactions   Byetta 10 Mcg Pen [exenatide] Hives   Clindamycin/lincomycin Nausea And Vomiting   Penicillins Other (See Comments)   Has patient had a PCN reaction causing immediate rash, facial/tongue/throat swelling, SOB or lightheadedness with hypotension: No Has patient had a PCN reaction causing severe rash involving mucus membranes or skin necrosis: No Has patient had a PCN reaction that required hospitalization No Has patient had a PCN reaction occurring within the last 10 years: No If all of the above answers are "NO", then may proceed with Cephalosporin use.   Avocado Diarrhea, Other (See Comments)   Severe cramping, sweats, and diarrhea in upper GI/stomach   Tramadol Hcl Itching, Rash       Medication List    STOP taking these medications   ALLEGRA PO   ciprofloxacin 500 MG tablet Commonly known as:  CIPRO   co-enzyme Q-10 30 MG capsule   GLUCOSAMINE CHONDROITIN JOINT PO   glucose blood test strip Commonly known as:  BAYER CONTOUR NEXT TEST   Magnesium 500 MG Tabs   MILK THISTLE PO   multivitamin tablet   NOVOLIN 70/30 RELION (70-30) 100 UNIT/ML injection Generic drug:  insulin NPH-regular Human Replaced by:  insulin aspart protamine- aspart (70-30) 100 UNIT/ML injection   ondansetron 8 MG disintegrating tablet Commonly known as:  ZOFRAN ODT   prazosin 5 MG capsule Commonly known as:  MINIPRESS   valACYclovir 1000 MG tablet Commonly known as:  VALTREX     TAKE these medications     Indication  albuterol 108 (90 Base) MCG/ACT inhaler Commonly known as:  PROAIR HFA Inhale 2 puffs into  the lungs every 4 (four) hours as needed for wheezing.  Indication:  Disease Involving Spasms of the Bronchus   ARIPiprazole 10 MG tablet Commonly known as:  ABILIFY Take 1 tablet (10 mg total) by mouth at bedtime.  Indication:  mood stabilization   buPROPion 300 MG 24 hr tablet Commonly known as:  WELLBUTRIN XL Take 1 tablet (300 mg total) by mouth daily. Start taking on:  10/28/2016  Indication:  Major Depressive Disorder   clopidogrel 75 MG tablet Commonly known as:  PLAVIX Take 1 tablet (75 mg total) by mouth daily.  Indication:  Stroke caused by a Blood Clot   insulin aspart protamine- aspart (70-30) 100 UNIT/ML injection Commonly known as:  NOVOLOG MIX 70/30 Inject 0.55 mLs (55 Units total) into the skin daily with breakfast. Replaces:  NOVOLIN 70/30 RELION (70-30) 100 UNIT/ML injection  Indication:  Type 2 Diabetes   levothyroxine 125 MCG tablet Commonly known as:  SYNTHROID, LEVOTHROID Take 1 tablet (125 mcg total) by mouth daily before breakfast. Start taking on:  10/28/2016 What changed:  See the new instructions.  Indication:   Underactive Thyroid   metFORMIN 1000 MG tablet Commonly known as:  GLUCOPHAGE Take 1 tablet (1,000 mg total) by mouth 2 (two) times daily with a meal. What changed:  See the new instructions.  Indication:  Type 2 Diabetes   metoprolol succinate 25 MG 24 hr tablet Commonly known as:  TOPROL-XL Take 0.5 tablets (12.5 mg total) by mouth daily. Start taking on:  10/28/2016 What changed:  See the new instructions.  Indication:  High Blood Pressure Disorder   omeprazole 40 MG capsule Commonly known as:  PRILOSEC Take 1 capsule (40 mg total) by mouth daily.  Indication:  Gastroesophageal Reflux Disease   pioglitazone 45 MG tablet Commonly known as:  ACTOS Take 1 tablet (45 mg total) by mouth daily. Start taking on:  10/28/2016 What changed:  See the new instructions.  Indication:  Type 2 Diabetes   rosuvastatin 20 MG tablet Commonly known as:  CRESTOR Take 1 tablet (20 mg total) by mouth daily.  Indication:  High Amount of Fats in the Blood   sertraline 25 MG tablet Commonly known as:  ZOLOFT Take 5 tablets (125 mg total) by mouth daily. Start taking on:  10/28/2016  Indication:  Major Depressive Disorder   topiramate 100 MG tablet Commonly known as:  TOPAMAX Take 1 tablet (100 mg total) by mouth 2 (two) times daily.  Indication:  Migraine Headache   traZODone 100 MG tablet Commonly known as:  DESYREL Take 1 tablet (100 mg total) by mouth at bedtime.  Indication:  Trouble Sleeping      Follow-up Information    Dr. Pervis Hocking Follow up.   Why:  Your Education officer, museum will call and confirm your next appointment date/time. Contact information: [place] at [address]  [address]  [place], [place] [address] [phone].           Follow-up recommendations:  Activity:  as tol Diet:  as tol  Comments:  1.  Take all your medications as prescribed.   2.  Report any adverse side effects to outpatient provider. 3.  Patient instructed to  not use alcohol or illegal drugs while on prescription medicines. 4.  In the event of worsening symptoms, instructed patient to call 911, the crisis hotline or go to nearest emergency room for evaluation of symptoms.  Signed: Janett Labella, NP [facility] 10/27/2016, 10:22 AM

## 2016-10-27 NOTE — Plan of Care (Signed)
Problem: [facility] Participation in Recreation Therapeutic Interventions Goal: STG-Patient will demonstrate improved self esteem by identif STG: Self-Esteem - Patient will improve self-esteem, as demonstrated by ability to identify at least 5 positive qualities about him/herself by conclusion of recreation therapy tx  Outcome: Adequate for Discharge Pt was able to demonstrate improved self esteem by attending coping skills, self-esteem and communication recreation therapy sessions.  Victorino Sparrow, LRT/CTRS

## 2016-10-27 NOTE — BHH Counselor (Signed)
Per patient request, CSW faxed request to release medical records from [facility] to [facility]. CSW spoke with pt's therapist and admission coordinator at [facility]. Pt has a bed at [facility] inpatient trauma program but needs records stating how her strokes were treated and any recommendations made by the neurologist. Pt can return home to await transportation to program.   Wray Kearns MSW, [facility]  10/27/2016 8:58 AM

## 2016-10-27 NOTE — BHH Suicide Risk Assessment (Signed)
[place] INPATIENT:  Family/Significant Other Suicide Prevention Education  Suicide Prevention Education:  Contact Attempts:Janetta Elie Goody (roommate) [phone] has been identified by the patient as the family member/significant other with whom the patient will be residing, and identified as the person(s) who will aid the patient in the event of a mental health crisis.  With written consent from the patient, two attempts were made to provide suicide prevention education, prior to and/or following the patient's discharge.  We were unsuccessful in providing suicide prevention education.  A suicide education pamphlet was given to the patient to share with family/significant other.  Date and time of first attempt:10/27/2016 10:14 AM  Date and time of second attempt:10/27/2016 12:00pm   [place] MSW, LCSWA  10/27/2016, 10:12 AM

## 2016-10-31 ENCOUNTER — Ambulatory Visit (INDEPENDENT_AMBULATORY_CARE_PROVIDER_SITE_OTHER): Payer: BLUE CROSS/BLUE SHIELD | Admitting: Psychology

## 2016-10-31 DIAGNOSIS — F4481 Dissociative identity disorder: Secondary | ICD-10-CM | POA: Diagnosis not present

## 2016-10-31 DIAGNOSIS — F4310 Post-traumatic stress disorder, unspecified: Secondary | ICD-10-CM

## 2016-10-31 DIAGNOSIS — F449 Dissociative and conversion disorder, unspecified: Secondary | ICD-10-CM

## 2016-11-01 ENCOUNTER — Ambulatory Visit (INDEPENDENT_AMBULATORY_CARE_PROVIDER_SITE_OTHER): Payer: BLUE CROSS/BLUE SHIELD | Admitting: Psychology

## 2016-11-01 DIAGNOSIS — F4310 Post-traumatic stress disorder, unspecified: Secondary | ICD-10-CM | POA: Diagnosis not present

## 2016-11-01 DIAGNOSIS — F331 Major depressive disorder, recurrent, moderate: Secondary | ICD-10-CM | POA: Diagnosis not present

## 2016-11-01 DIAGNOSIS — F4481 Dissociative identity disorder: Secondary | ICD-10-CM | POA: Diagnosis not present

## 2016-11-03 ENCOUNTER — Encounter: Payer: Self-pay | Admitting: Family Medicine

## 2016-11-03 ENCOUNTER — Telehealth: Payer: Self-pay | Admitting: Family Medicine

## 2016-11-03 NOTE — Telephone Encounter (Signed)
I really would not have much to offer them. We had treated her off and on for migraines over the years but these were never a major problem until she started having stroke like symptoms last fall. All her neurologic care in the past year has been with Dr. Lauraine Rinne of [facility] at the [facility] in [facility]. His phone # is [phone].

## 2016-11-03 NOTE — Telephone Encounter (Signed)
° ° ° °  Dr Shirlean Schlein a Psychiatrist with [facility] call to ask if Dr Sarajane Jews would call her she has questions about pt migraines.She said pt is a in patient at  [facility]. She would like a call back     [phone]

## 2016-11-08 NOTE — Telephone Encounter (Signed)
I left a voice message for Dr. Shirlean Schlein with below information.

## 2016-11-20 ENCOUNTER — Ambulatory Visit: Payer: BLUE CROSS/BLUE SHIELD | Admitting: Psychology

## 2016-12-10 ENCOUNTER — Other Ambulatory Visit: Payer: Self-pay | Admitting: Gastroenterology

## 2016-12-10 DIAGNOSIS — R1012 Left upper quadrant pain: Secondary | ICD-10-CM

## 2016-12-10 DIAGNOSIS — D125 Benign neoplasm of sigmoid colon: Secondary | ICD-10-CM

## 2016-12-10 DIAGNOSIS — R197 Diarrhea, unspecified: Secondary | ICD-10-CM

## 2016-12-10 DIAGNOSIS — K3189 Other diseases of stomach and duodenum: Secondary | ICD-10-CM

## 2016-12-11 ENCOUNTER — Ambulatory Visit: Payer: BLUE CROSS/BLUE SHIELD | Admitting: Psychology

## 2016-12-28 DIAGNOSIS — F4481 Dissociative identity disorder: Secondary | ICD-10-CM | POA: Diagnosis not present

## 2016-12-29 DIAGNOSIS — F4481 Dissociative identity disorder: Secondary | ICD-10-CM | POA: Diagnosis not present

## 2016-12-30 DIAGNOSIS — F4481 Dissociative identity disorder: Secondary | ICD-10-CM | POA: Diagnosis not present

## 2017-01-01 ENCOUNTER — Other Ambulatory Visit: Payer: Self-pay | Admitting: Obstetrics and Gynecology

## 2017-01-01 DIAGNOSIS — Z1231 Encounter for screening mammogram for malignant neoplasm of breast: Secondary | ICD-10-CM

## 2017-01-03 ENCOUNTER — Ambulatory Visit (INDEPENDENT_AMBULATORY_CARE_PROVIDER_SITE_OTHER): Payer: BLUE CROSS/BLUE SHIELD | Admitting: Psychology

## 2017-01-03 DIAGNOSIS — F4481 Dissociative identity disorder: Secondary | ICD-10-CM

## 2017-01-04 ENCOUNTER — Ambulatory Visit (INDEPENDENT_AMBULATORY_CARE_PROVIDER_SITE_OTHER): Payer: BLUE CROSS/BLUE SHIELD | Admitting: Family Medicine

## 2017-01-04 ENCOUNTER — Encounter: Payer: Self-pay | Admitting: Family Medicine

## 2017-01-04 VITALS — Temp 98.3°F | Ht 61.0 in | Wt 236.0 lb

## 2017-01-04 DIAGNOSIS — G4489 Other headache syndrome: Secondary | ICD-10-CM | POA: Diagnosis not present

## 2017-01-04 DIAGNOSIS — F4481 Dissociative identity disorder: Secondary | ICD-10-CM | POA: Diagnosis not present

## 2017-01-04 DIAGNOSIS — G459 Transient cerebral ischemic attack, unspecified: Secondary | ICD-10-CM | POA: Diagnosis not present

## 2017-01-04 DIAGNOSIS — E119 Type 2 diabetes mellitus without complications: Secondary | ICD-10-CM

## 2017-01-04 DIAGNOSIS — F4310 Post-traumatic stress disorder, unspecified: Secondary | ICD-10-CM | POA: Diagnosis not present

## 2017-01-04 DIAGNOSIS — Z794 Long term (current) use of insulin: Secondary | ICD-10-CM | POA: Diagnosis not present

## 2017-01-04 DIAGNOSIS — I10 Essential (primary) hypertension: Secondary | ICD-10-CM | POA: Diagnosis not present

## 2017-01-04 DIAGNOSIS — F333 Major depressive disorder, recurrent, severe with psychotic symptoms: Secondary | ICD-10-CM | POA: Diagnosis not present

## 2017-01-04 DIAGNOSIS — F4311 Post-traumatic stress disorder, acute: Secondary | ICD-10-CM | POA: Diagnosis not present

## 2017-01-04 DIAGNOSIS — F449 Dissociative and conversion disorder, unspecified: Secondary | ICD-10-CM | POA: Diagnosis not present

## 2017-01-04 MED ORDER — RIZATRIPTAN BENZOATE 10 MG PO TABS: 10.0000 mg | ORAL_TABLET | ORAL | 5 refills | Status: DC | PRN

## 2017-01-04 MED ORDER — INSULIN GLARGINE 100 UNIT/ML SOLOSTAR PEN: 20.0000 [IU] | PEN_INJECTOR | Freq: Every day | SUBCUTANEOUS | 3 refills | Status: DC

## 2017-01-04 NOTE — Patient Instructions (Signed)
Pre visit review using our clinic review tool, if applicable. No additional management support is needed unless otherwise documented below in the visit note. 

## 2017-01-04 NOTE — Progress Notes (Signed)
Pre visit review using our clinic review tool, if applicable. No additional management support is needed unless otherwise documented below in the visit note. 

## 2017-01-04 NOTE — Progress Notes (Signed)
   Subjective:    Patient ID: Miranda [name], female    DOB: [birth year], 41 y.o.   MRN: [id number]  HPI Here to follow up a hospital stay at [facility] from 10-20-16 to 10-27-16 and at the [facility] system in [place] from 11-02-16 to 12-30-16. The first of these was for headaches and stroke like symptoms that were eventually diagnosed as both TIA's and as complicated migraines. No actual strokes were found. Then the second hospital stay was for psychiatric issues including severe depression with psychosis, multiple personalities, and PTSD. Now her psychiatric state is much better controlled. She sees Noemi Chapel NP once a month for medication management and she sees Pervis Hocking PhD twice a week for therapy. During this time her BP have been running quite low and she monitors this at home closely. She takes Metoprolol for her headaches and she takes Prazocin at night to control nightmares, but she will hold one or the other of these meds if her BP gets too low. Her A1c on 11-06-16 was 7.3. She watches her diet closely now and she has lost about 15 lbs of weight.    Review of Systems  Constitutional: Negative.   Respiratory: Negative.   Cardiovascular: Negative.   Neurological: Positive for headaches. Negative for dizziness, tremors, seizures, syncope, facial asymmetry, speech difficulty, weakness, light-headedness and numbness.  Psychiatric/Behavioral: Positive for decreased concentration, dysphoric mood and sleep disturbance. Negative for agitation, behavioral problems, confusion, hallucinations, self-injury and suicidal ideas. The patient is nervous/anxious.        Objective:   Physical Exam  Constitutional: She is oriented to person, place, and time. She appears well-developed and well-nourished. No distress.  Neck: No thyromegaly present.  Cardiovascular: Normal rate, regular rhythm, normal heart sounds and intact distal pulses.   Pulmonary/Chest: Effort normal and breath sounds  normal.  Lymphadenopathy:    She has no cervical adenopathy.  Neurological: She is alert and oriented to person, place, and time.  Psychiatric: She has a normal mood and affect. Her behavior is normal. Thought content normal.          Assessment & Plan:  Her psychiatric issues seem to be fairly well controlled and she will follow up with her therapist and psychiatrist as above. Her headaches are stable, and I refilled Maxalt to use prn. She will see Dr. Buddy Duty on 02-02-17 about her diabetes. She will see her neurologist Dr. Rollene Rotunda on 01-22-17. Her low BP seems to be stable.  Alysia Penna, MD

## 2017-01-05 ENCOUNTER — Ambulatory Visit (INDEPENDENT_AMBULATORY_CARE_PROVIDER_SITE_OTHER): Payer: BLUE CROSS/BLUE SHIELD | Admitting: Psychology

## 2017-01-05 DIAGNOSIS — F4481 Dissociative identity disorder: Secondary | ICD-10-CM

## 2017-01-05 DIAGNOSIS — F4312 Post-traumatic stress disorder, chronic: Secondary | ICD-10-CM | POA: Diagnosis not present

## 2017-01-09 DIAGNOSIS — Z01419 Encounter for gynecological examination (general) (routine) without abnormal findings: Secondary | ICD-10-CM | POA: Diagnosis not present

## 2017-01-09 DIAGNOSIS — Z6841 Body Mass Index (BMI) 40.0 and over, adult: Secondary | ICD-10-CM | POA: Diagnosis not present

## 2017-01-09 DIAGNOSIS — Z113 Encounter for screening for infections with a predominantly sexual mode of transmission: Secondary | ICD-10-CM | POA: Diagnosis not present

## 2017-01-12 ENCOUNTER — Ambulatory Visit (INDEPENDENT_AMBULATORY_CARE_PROVIDER_SITE_OTHER): Payer: BLUE CROSS/BLUE SHIELD | Admitting: Psychology

## 2017-01-12 ENCOUNTER — Telehealth: Payer: Self-pay | Admitting: Family Medicine

## 2017-01-12 DIAGNOSIS — F4481 Dissociative identity disorder: Secondary | ICD-10-CM

## 2017-01-12 NOTE — Telephone Encounter (Signed)
Pt needs new rx flomax 0.4 mg #90 w/refills send to [facility]

## 2017-01-15 NOTE — Telephone Encounter (Signed)
I have never prescribed this for a female. It was probably started by a Urologist, so she would need to get refills from them.

## 2017-01-15 NOTE — Telephone Encounter (Signed)
Can we refill this? 

## 2017-01-15 NOTE — Telephone Encounter (Signed)
Pt is calling to check the status of Rx Flomax

## 2017-01-16 ENCOUNTER — Ambulatory Visit (INDEPENDENT_AMBULATORY_CARE_PROVIDER_SITE_OTHER): Payer: BLUE CROSS/BLUE SHIELD | Admitting: Psychology

## 2017-01-16 DIAGNOSIS — F449 Dissociative and conversion disorder, unspecified: Secondary | ICD-10-CM | POA: Diagnosis not present

## 2017-01-16 DIAGNOSIS — F4481 Dissociative identity disorder: Secondary | ICD-10-CM

## 2017-01-16 DIAGNOSIS — I63512 Cerebral infarction due to unspecified occlusion or stenosis of left middle cerebral artery: Secondary | ICD-10-CM | POA: Diagnosis not present

## 2017-01-16 DIAGNOSIS — I639 Cerebral infarction, unspecified: Secondary | ICD-10-CM | POA: Diagnosis not present

## 2017-01-16 DIAGNOSIS — Z9181 History of falling: Secondary | ICD-10-CM | POA: Diagnosis not present

## 2017-01-16 NOTE — Telephone Encounter (Signed)
We see her tomorrow so I'll do it then

## 2017-01-16 NOTE — Telephone Encounter (Signed)
Pt requesting a referral for Urology, can you review the ER note from 12/14/2016, diagnosis was urinary urgency?

## 2017-01-17 ENCOUNTER — Encounter: Payer: Self-pay | Admitting: Family Medicine

## 2017-01-17 ENCOUNTER — Ambulatory Visit
Admission: RE | Admit: 2017-01-17 | Discharge: 2017-01-17 | Disposition: A | Discharge status: Home / Self Care | Payer: BLUE CROSS/BLUE SHIELD | Source: Ambulatory Visit | Attending: Obstetrics and Gynecology | Admitting: Obstetrics and Gynecology

## 2017-01-17 ENCOUNTER — Ambulatory Visit (INDEPENDENT_AMBULATORY_CARE_PROVIDER_SITE_OTHER): Payer: BLUE CROSS/BLUE SHIELD | Admitting: Family Medicine

## 2017-01-17 ENCOUNTER — Ambulatory Visit: Payer: Self-pay

## 2017-01-17 VITALS — BP 124/82 | HR 59 | Temp 98.7°F | Ht 61.0 in | Wt 237.0 lb

## 2017-01-17 DIAGNOSIS — J039 Acute tonsillitis, unspecified: Secondary | ICD-10-CM

## 2017-01-17 DIAGNOSIS — Z1231 Encounter for screening mammogram for malignant neoplasm of breast: Secondary | ICD-10-CM

## 2017-01-17 MED ORDER — CEPHALEXIN 500 MG PO CAPS: 500.0000 mg | ORAL_CAPSULE | Freq: Four times a day (QID) | ORAL | 0 refills | Status: AC

## 2017-01-17 NOTE — Patient Instructions (Signed)
WE NOW OFFER   [facility] FAST TRACK!!!  SAME DAY Appointments for ACUTE CARE  Such as: Sprains, Injuries, cuts, abrasions, rashes, muscle pain, joint pain, back pain Colds, flu, sore throats, headache, allergies, cough, fever  Ear pain, sinus and eye infections Abdominal pain, nausea, vomiting, diarrhea, upset stomach Animal/insect bites  3 Easy Ways to Schedule: Walk-In Scheduling Call in scheduling Mychart Sign-up: [email or web address]         

## 2017-01-17 NOTE — Progress Notes (Signed)
   Subjective:    Patient ID: Miranda [name], female    DOB: 09-Feb-1976, 41 y.o.   MRN: [id number]  HPI Here for one week of soreness in the right side of her throat and swollen nodes in the right neck. No fever.    Review of Systems  Constitutional: Negative.   HENT: Positive for postnasal drip and sore throat. Negative for congestion, sinus pain and sinus pressure.   Eyes: Negative.   Respiratory: Negative.        Objective:   Physical Exam  Constitutional: She appears well-developed and well-nourished.  HENT:  Right Ear: External ear normal.  Left Ear: External ear normal.  Nose: Nose normal.  Right tonsil is pink and mildly swollen   Eyes: Conjunctivae are normal.  Neck: Neck supple. No thyromegaly present.  Shotty but tender right AC nodes   Pulmonary/Chest: Effort normal and breath sounds normal.          Assessment & Plan:  Tonsillitis, treat with Keflex.  Alysia Penna, MD

## 2017-01-17 NOTE — Progress Notes (Signed)
Pre visit review using our clinic review tool, if applicable. No additional management support is needed unless otherwise documented below in the visit note. 

## 2017-01-18 ENCOUNTER — Ambulatory Visit (INDEPENDENT_AMBULATORY_CARE_PROVIDER_SITE_OTHER): Payer: BLUE CROSS/BLUE SHIELD | Admitting: Psychology

## 2017-01-18 DIAGNOSIS — F4312 Post-traumatic stress disorder, chronic: Secondary | ICD-10-CM

## 2017-01-18 DIAGNOSIS — F4481 Dissociative identity disorder: Secondary | ICD-10-CM | POA: Diagnosis not present

## 2017-01-22 ENCOUNTER — Ambulatory Visit: Payer: BLUE CROSS/BLUE SHIELD | Admitting: Psychology

## 2017-01-22 DIAGNOSIS — R42 Dizziness and giddiness: Secondary | ICD-10-CM | POA: Diagnosis not present

## 2017-01-22 DIAGNOSIS — H9203 Otalgia, bilateral: Secondary | ICD-10-CM | POA: Diagnosis not present

## 2017-01-22 DIAGNOSIS — H6123 Impacted cerumen, bilateral: Secondary | ICD-10-CM | POA: Diagnosis not present

## 2017-01-23 DIAGNOSIS — E785 Hyperlipidemia, unspecified: Secondary | ICD-10-CM | POA: Diagnosis not present

## 2017-01-23 DIAGNOSIS — I639 Cerebral infarction, unspecified: Secondary | ICD-10-CM | POA: Diagnosis not present

## 2017-01-23 DIAGNOSIS — G4733 Obstructive sleep apnea (adult) (pediatric): Secondary | ICD-10-CM | POA: Diagnosis not present

## 2017-01-25 DIAGNOSIS — R569 Unspecified convulsions: Secondary | ICD-10-CM | POA: Diagnosis not present

## 2017-01-29 ENCOUNTER — Ambulatory Visit (INDEPENDENT_AMBULATORY_CARE_PROVIDER_SITE_OTHER): Payer: BLUE CROSS/BLUE SHIELD | Admitting: Psychology

## 2017-01-29 DIAGNOSIS — F4481 Dissociative identity disorder: Secondary | ICD-10-CM

## 2017-01-30 ENCOUNTER — Other Ambulatory Visit: Payer: Self-pay | Admitting: Family Medicine

## 2017-02-01 ENCOUNTER — Ambulatory Visit (INDEPENDENT_AMBULATORY_CARE_PROVIDER_SITE_OTHER): Payer: BLUE CROSS/BLUE SHIELD | Admitting: Psychology

## 2017-02-01 DIAGNOSIS — F4481 Dissociative identity disorder: Secondary | ICD-10-CM

## 2017-02-01 DIAGNOSIS — F4312 Post-traumatic stress disorder, chronic: Secondary | ICD-10-CM | POA: Diagnosis not present

## 2017-02-02 DIAGNOSIS — E1165 Type 2 diabetes mellitus with hyperglycemia: Secondary | ICD-10-CM | POA: Diagnosis not present

## 2017-02-02 DIAGNOSIS — Z794 Long term (current) use of insulin: Secondary | ICD-10-CM | POA: Diagnosis not present

## 2017-02-02 DIAGNOSIS — Z5181 Encounter for therapeutic drug level monitoring: Secondary | ICD-10-CM | POA: Diagnosis not present

## 2017-02-02 DIAGNOSIS — E039 Hypothyroidism, unspecified: Secondary | ICD-10-CM | POA: Diagnosis not present

## 2017-02-02 DIAGNOSIS — Z0279 Encounter for issue of other medical certificate: Secondary | ICD-10-CM

## 2017-02-02 LAB — BASIC METABOLIC PANEL
BUN: 8 mg/dL (ref 4–21)
Creatinine: 0.6 mg/dL (ref 0.5–1.1)
GLUCOSE: 214 mg/dL
Potassium: 3.6 mmol/L (ref 3.4–5.3)
SODIUM: 137 mmol/L (ref 137–147)

## 2017-02-02 LAB — HEPATIC FUNCTION PANEL
ALK PHOS: 53 U/L (ref 25–125)
ALT: 27 U/L (ref 7–35)
AST: 27 U/L (ref 13–35)
BILIRUBIN, TOTAL: 0.4 mg/dL

## 2017-02-02 LAB — HEMOGLOBIN A1C: Hemoglobin A1C: 7.1

## 2017-02-02 LAB — TSH: TSH: 3.03 u[IU]/mL (ref 0.41–5.90)

## 2017-02-02 LAB — VITAMIN B12: Vitamin B-12: 664

## 2017-02-05 DIAGNOSIS — M1990 Unspecified osteoarthritis, unspecified site: Secondary | ICD-10-CM | POA: Diagnosis not present

## 2017-02-05 DIAGNOSIS — Z6841 Body Mass Index (BMI) 40.0 and over, adult: Secondary | ICD-10-CM | POA: Diagnosis not present

## 2017-02-05 DIAGNOSIS — G4733 Obstructive sleep apnea (adult) (pediatric): Secondary | ICD-10-CM | POA: Diagnosis not present

## 2017-02-05 DIAGNOSIS — Z7902 Long term (current) use of antithrombotics/antiplatelets: Secondary | ICD-10-CM | POA: Diagnosis not present

## 2017-02-05 DIAGNOSIS — R002 Palpitations: Secondary | ICD-10-CM | POA: Diagnosis not present

## 2017-02-05 DIAGNOSIS — Z79899 Other long term (current) drug therapy: Secondary | ICD-10-CM | POA: Diagnosis not present

## 2017-02-05 DIAGNOSIS — E785 Hyperlipidemia, unspecified: Secondary | ICD-10-CM | POA: Diagnosis not present

## 2017-02-05 DIAGNOSIS — E119 Type 2 diabetes mellitus without complications: Secondary | ICD-10-CM | POA: Diagnosis not present

## 2017-02-05 DIAGNOSIS — I10 Essential (primary) hypertension: Secondary | ICD-10-CM | POA: Diagnosis not present

## 2017-02-05 DIAGNOSIS — Z794 Long term (current) use of insulin: Secondary | ICD-10-CM | POA: Diagnosis not present

## 2017-02-05 DIAGNOSIS — Z8673 Personal history of transient ischemic attack (TIA), and cerebral infarction without residual deficits: Secondary | ICD-10-CM | POA: Diagnosis not present

## 2017-02-05 DIAGNOSIS — Z9989 Dependence on other enabling machines and devices: Secondary | ICD-10-CM | POA: Diagnosis not present

## 2017-02-05 HISTORY — PX: LOOP RECORDER IMPLANT: SHX5954

## 2017-02-06 ENCOUNTER — Ambulatory Visit (INDEPENDENT_AMBULATORY_CARE_PROVIDER_SITE_OTHER): Payer: BLUE CROSS/BLUE SHIELD | Admitting: Psychology

## 2017-02-06 DIAGNOSIS — F4481 Dissociative identity disorder: Secondary | ICD-10-CM | POA: Diagnosis not present

## 2017-02-06 DIAGNOSIS — R002 Palpitations: Secondary | ICD-10-CM | POA: Diagnosis not present

## 2017-02-06 DIAGNOSIS — F4312 Post-traumatic stress disorder, chronic: Secondary | ICD-10-CM

## 2017-02-07 ENCOUNTER — Ambulatory Visit: Payer: BLUE CROSS/BLUE SHIELD | Admitting: Psychology

## 2017-02-08 ENCOUNTER — Ambulatory Visit (INDEPENDENT_AMBULATORY_CARE_PROVIDER_SITE_OTHER): Payer: BLUE CROSS/BLUE SHIELD | Admitting: Psychology

## 2017-02-08 DIAGNOSIS — F4312 Post-traumatic stress disorder, chronic: Secondary | ICD-10-CM

## 2017-02-08 DIAGNOSIS — F4481 Dissociative identity disorder: Secondary | ICD-10-CM | POA: Diagnosis not present

## 2017-02-13 DIAGNOSIS — R102 Pelvic and perineal pain: Secondary | ICD-10-CM | POA: Diagnosis not present

## 2017-02-13 DIAGNOSIS — Z3202 Encounter for pregnancy test, result negative: Secondary | ICD-10-CM | POA: Diagnosis not present

## 2017-02-14 ENCOUNTER — Ambulatory Visit (INDEPENDENT_AMBULATORY_CARE_PROVIDER_SITE_OTHER): Payer: BLUE CROSS/BLUE SHIELD | Admitting: Psychology

## 2017-02-14 DIAGNOSIS — F4312 Post-traumatic stress disorder, chronic: Secondary | ICD-10-CM

## 2017-02-14 DIAGNOSIS — F4481 Dissociative identity disorder: Secondary | ICD-10-CM | POA: Diagnosis not present

## 2017-02-15 ENCOUNTER — Ambulatory Visit (INDEPENDENT_AMBULATORY_CARE_PROVIDER_SITE_OTHER): Payer: BLUE CROSS/BLUE SHIELD | Admitting: Psychology

## 2017-02-15 DIAGNOSIS — F4312 Post-traumatic stress disorder, chronic: Secondary | ICD-10-CM

## 2017-02-15 DIAGNOSIS — F4481 Dissociative identity disorder: Secondary | ICD-10-CM | POA: Diagnosis not present

## 2017-02-18 DIAGNOSIS — G4733 Obstructive sleep apnea (adult) (pediatric): Secondary | ICD-10-CM | POA: Diagnosis not present

## 2017-02-19 ENCOUNTER — Encounter: Payer: Self-pay | Admitting: Endocrinology

## 2017-02-19 DIAGNOSIS — H35413 Lattice degeneration of retina, bilateral: Secondary | ICD-10-CM | POA: Diagnosis not present

## 2017-02-19 DIAGNOSIS — H2513 Age-related nuclear cataract, bilateral: Secondary | ICD-10-CM | POA: Diagnosis not present

## 2017-02-19 DIAGNOSIS — H25013 Cortical age-related cataract, bilateral: Secondary | ICD-10-CM | POA: Diagnosis not present

## 2017-02-19 DIAGNOSIS — E119 Type 2 diabetes mellitus without complications: Secondary | ICD-10-CM | POA: Diagnosis not present

## 2017-02-19 LAB — HM DIABETES EYE EXAM

## 2017-02-20 ENCOUNTER — Encounter: Payer: Self-pay | Admitting: Family Medicine

## 2017-02-20 ENCOUNTER — Ambulatory Visit (INDEPENDENT_AMBULATORY_CARE_PROVIDER_SITE_OTHER): Payer: BLUE CROSS/BLUE SHIELD | Admitting: Psychology

## 2017-02-20 DIAGNOSIS — F4481 Dissociative identity disorder: Secondary | ICD-10-CM | POA: Diagnosis not present

## 2017-02-20 DIAGNOSIS — F4312 Post-traumatic stress disorder, chronic: Secondary | ICD-10-CM | POA: Diagnosis not present

## 2017-02-22 ENCOUNTER — Ambulatory Visit (INDEPENDENT_AMBULATORY_CARE_PROVIDER_SITE_OTHER): Payer: BLUE CROSS/BLUE SHIELD | Admitting: Psychology

## 2017-02-22 DIAGNOSIS — F4481 Dissociative identity disorder: Secondary | ICD-10-CM | POA: Diagnosis not present

## 2017-02-22 DIAGNOSIS — F4312 Post-traumatic stress disorder, chronic: Secondary | ICD-10-CM | POA: Diagnosis not present

## 2017-02-27 ENCOUNTER — Ambulatory Visit (INDEPENDENT_AMBULATORY_CARE_PROVIDER_SITE_OTHER): Payer: Self-pay | Admitting: Psychology

## 2017-02-27 DIAGNOSIS — F4312 Post-traumatic stress disorder, chronic: Secondary | ICD-10-CM

## 2017-03-01 ENCOUNTER — Ambulatory Visit (INDEPENDENT_AMBULATORY_CARE_PROVIDER_SITE_OTHER): Payer: Self-pay | Admitting: Psychology

## 2017-03-01 DIAGNOSIS — F4481 Dissociative identity disorder: Secondary | ICD-10-CM

## 2017-03-04 ENCOUNTER — Other Ambulatory Visit: Payer: Self-pay | Admitting: Gastroenterology

## 2017-03-04 DIAGNOSIS — R197 Diarrhea, unspecified: Secondary | ICD-10-CM

## 2017-03-04 DIAGNOSIS — R1012 Left upper quadrant pain: Secondary | ICD-10-CM

## 2017-03-04 DIAGNOSIS — K3189 Other diseases of stomach and duodenum: Secondary | ICD-10-CM

## 2017-03-04 DIAGNOSIS — D125 Benign neoplasm of sigmoid colon: Secondary | ICD-10-CM

## 2017-03-05 ENCOUNTER — Ambulatory Visit (INDEPENDENT_AMBULATORY_CARE_PROVIDER_SITE_OTHER): Payer: BLUE CROSS/BLUE SHIELD | Admitting: Psychology

## 2017-03-05 DIAGNOSIS — F4481 Dissociative identity disorder: Secondary | ICD-10-CM

## 2017-03-12 ENCOUNTER — Ambulatory Visit (INDEPENDENT_AMBULATORY_CARE_PROVIDER_SITE_OTHER): Payer: Self-pay | Admitting: Psychology

## 2017-03-12 DIAGNOSIS — F4312 Post-traumatic stress disorder, chronic: Secondary | ICD-10-CM

## 2017-03-12 DIAGNOSIS — F4481 Dissociative identity disorder: Secondary | ICD-10-CM

## 2017-03-14 ENCOUNTER — Ambulatory Visit: Payer: Self-pay | Admitting: *Deleted

## 2017-03-15 ENCOUNTER — Ambulatory Visit: Payer: Self-pay | Admitting: Psychology

## 2017-03-20 DIAGNOSIS — Z794 Long term (current) use of insulin: Secondary | ICD-10-CM | POA: Diagnosis not present

## 2017-03-20 DIAGNOSIS — Z5181 Encounter for therapeutic drug level monitoring: Secondary | ICD-10-CM | POA: Diagnosis not present

## 2017-03-20 DIAGNOSIS — E1165 Type 2 diabetes mellitus with hyperglycemia: Secondary | ICD-10-CM | POA: Diagnosis not present

## 2017-03-22 ENCOUNTER — Ambulatory Visit (INDEPENDENT_AMBULATORY_CARE_PROVIDER_SITE_OTHER): Payer: Self-pay | Admitting: Psychology

## 2017-03-22 DIAGNOSIS — F4312 Post-traumatic stress disorder, chronic: Secondary | ICD-10-CM

## 2017-03-22 DIAGNOSIS — F4481 Dissociative identity disorder: Secondary | ICD-10-CM

## 2017-03-22 DIAGNOSIS — F449 Dissociative and conversion disorder, unspecified: Secondary | ICD-10-CM | POA: Diagnosis not present

## 2017-03-22 DIAGNOSIS — F4311 Post-traumatic stress disorder, acute: Secondary | ICD-10-CM | POA: Diagnosis not present

## 2017-03-23 ENCOUNTER — Encounter: Payer: Self-pay | Admitting: Family Medicine

## 2017-03-26 ENCOUNTER — Ambulatory Visit (INDEPENDENT_AMBULATORY_CARE_PROVIDER_SITE_OTHER): Payer: Self-pay | Admitting: Psychology

## 2017-03-26 DIAGNOSIS — F4312 Post-traumatic stress disorder, chronic: Secondary | ICD-10-CM

## 2017-03-28 DIAGNOSIS — Z0389 Encounter for observation for other suspected diseases and conditions ruled out: Secondary | ICD-10-CM | POA: Diagnosis not present

## 2017-03-28 DIAGNOSIS — N941 Unspecified dyspareunia: Secondary | ICD-10-CM | POA: Diagnosis not present

## 2017-03-28 DIAGNOSIS — N83202 Unspecified ovarian cyst, left side: Secondary | ICD-10-CM | POA: Diagnosis not present

## 2017-03-28 DIAGNOSIS — N760 Acute vaginitis: Secondary | ICD-10-CM | POA: Diagnosis not present

## 2017-03-28 DIAGNOSIS — Z113 Encounter for screening for infections with a predominantly sexual mode of transmission: Secondary | ICD-10-CM | POA: Diagnosis not present

## 2017-04-05 ENCOUNTER — Ambulatory Visit (INDEPENDENT_AMBULATORY_CARE_PROVIDER_SITE_OTHER): Payer: BLUE CROSS/BLUE SHIELD | Admitting: Psychology

## 2017-04-05 ENCOUNTER — Encounter: Payer: Self-pay | Admitting: Family Medicine

## 2017-04-05 ENCOUNTER — Ambulatory Visit (INDEPENDENT_AMBULATORY_CARE_PROVIDER_SITE_OTHER): Payer: BLUE CROSS/BLUE SHIELD | Admitting: Family Medicine

## 2017-04-05 VITALS — BP 98/60 | HR 91 | Temp 98.3°F | Ht 61.0 in | Wt 221.9 lb

## 2017-04-05 DIAGNOSIS — F4312 Post-traumatic stress disorder, chronic: Secondary | ICD-10-CM | POA: Diagnosis not present

## 2017-04-05 DIAGNOSIS — R110 Nausea: Secondary | ICD-10-CM

## 2017-04-05 DIAGNOSIS — Z3200 Encounter for pregnancy test, result unknown: Secondary | ICD-10-CM | POA: Diagnosis not present

## 2017-04-05 DIAGNOSIS — F4481 Dissociative identity disorder: Secondary | ICD-10-CM | POA: Diagnosis not present

## 2017-04-05 LAB — POCT URINE PREGNANCY: PREG TEST UR: NEGATIVE

## 2017-04-05 NOTE — Progress Notes (Signed)
HPI:  Acute visit for nausea: -on doxy (7 day course)  for UTI (Rxd by her gyn doc) -has had waves of nausea since starting the doxy - usually in the afternoon -is worried about pregnancy -dysuria has resolved -denies: vomiting, fevers, cough, SOB, abd pain, diarrhea, change in bowels, melena, hematochezia, rashes, tick bites, HA -FDLMP about 3.5 weeks ago  ROS: See pertinent positives and negatives per HPI.  Past Medical History:  Diagnosis Date  . Anxiety   . Arthritis   . Asthma   . Bowel obstruction ([place])   . Cardiac arrhythmia   . Depression    sees Dr. Matilde Haymaker in [place]   . Diabetes mellitus    type 2, on insulin pump, sees Dr. Loanne Drilling   . GERD (gastroesophageal reflux disease)   . Hyperlipidemia   . Hypertension   . Hypothyroidism    sees Dr. Elyse Hsu  . Insomnia   . Migraine syndrome   . Morbid obesity ([place])   . Multiple personality   . Neck pain   . Neuropathy associated with endocrine disorder ([place])   . Sleep apnea with use of continuous positive airway pressure (CPAP)    on CPAP  . 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 Stroke [facility]) 04/2016    Past Surgical History:  Procedure Laterality Date  . blocked itestinal repair  13   age 22 months  . BREAST BIOPSY Left 2017  . CHOLECYSTECTOMY  2011  . INDUCED ABORTION  1996   forced abortion  . INGUINAL HERNIA REPAIR Left 1980   age 5  . LAPAROSCOPIC ENDOMETRIOSIS FULGURATION  1998  . WISDOM TOOTH EXTRACTION  2007    Family History  Problem Relation Age of Onset  . Adopted: Yes  . Heart disease Unknown        on both sides of family  . Diabetes Unknown        on both sides of family    Social History   Social History  . Marital status: Married    Spouse name: N/A  . Number of children: 5  . Years of education: N/A   Occupational History  . Nurse, learning disability    Social History Main Topics  . Smoking status: Former Smoker    Packs/day: 0.00    Types: Cigarettes  .  Smokeless tobacco: Never Used     Comment: quit 03/25/2016  . Alcohol use No  . Drug use: No  . Sexual activity: Yes   Other Topics Concern  . None   Social History Narrative  . None     Current Outpatient Prescriptions:  .  albuterol (PROAIR HFA) 108 (90 BASE) MCG/ACT inhaler, Inhale 2 puffs into the lungs every 4 (four) hours as needed for wheezing., Disp: 8.5 g, Rfl: 11 .  ARIPiprazole (ABILIFY) 10 MG tablet, Take 1 tablet (10 mg total) by mouth at bedtime. (Patient taking differently: Take 15 mg by mouth at bedtime. ), Disp: 30 tablet, Rfl: 0 .  buPROPion (WELLBUTRIN XL) 300 MG 24 hr tablet, Take 1 tablet (300 mg total) by mouth daily., Disp: 30 tablet, Rfl: 0 .  clopidogrel (PLAVIX) 75 MG tablet, Take 1 tablet (75 mg total) by mouth daily., Disp: 90 tablet, Rfl: 3 .  doxycycline (VIBRAMYCIN) 100 MG capsule, Take 100 mg by mouth., Disp: , Rfl:  .  hydrOXYzine (ATARAX/VISTARIL) 50 MG tablet, Take 50 mg by mouth. Take 1 tablet every night and may take 3 other times as  needed, Disp: , Rfl:  .  insulin glargine (LANTUS) 100 UNIT/ML injection, Inject 20 Units into the skin at bedtime., Disp: , Rfl:  .  levothyroxine (SYNTHROID, LEVOTHROID) 125 MCG tablet, Take 1 tablet (125 mcg total) by mouth daily before breakfast., Disp: 30 tablet, Rfl: 0 .  metFORMIN (GLUCOPHAGE) 1000 MG tablet, Take 1 tablet (1,000 mg total) by mouth 2 (two) times daily with a meal., Disp: 60 tablet, Rfl: 0 .  metoprolol succinate (TOPROL-XL) 25 MG 24 hr tablet, Take 0.5 tablets (12.5 mg total) by mouth daily., Disp: 30 tablet, Rfl: 0 .  Multiple Vitamin (MULTIVITAMIN) tablet, Take 1 tablet by mouth daily., Disp: , Rfl:  .  omeprazole (PRILOSEC) 40 MG capsule, TAKE 1 CAPSULE(40 MG) BY MOUTH DAILY, Disp: 90 capsule, Rfl: 0 .  pioglitazone (ACTOS) 45 MG tablet, Take 1 tablet (45 mg total) by mouth daily., Disp: 30 tablet, Rfl: 0 .  prazosin (MINIPRESS) 5 MG capsule, Take 10 mg by mouth at bedtime., Disp: , Rfl:  .   rizatriptan (MAXALT) 10 MG tablet, Take 1 tablet (10 mg total) by mouth as needed for migraine. May repeat in 2 hours if needed, Disp: 30 tablet, Rfl: 5 .  rosuvastatin (CRESTOR) 20 MG tablet, TAKE 1 TABLET(20 MG) BY MOUTH DAILY, Disp: 30 tablet, Rfl: 3 .  sertraline (ZOLOFT) 25 MG tablet, Take 5 tablets (125 mg total) by mouth daily. (Patient taking differently: Take 200 mg by mouth daily. ), Disp: 30 tablet, Rfl: 0 .  topiramate (TOPAMAX) 100 MG tablet, Take 1 tablet (100 mg total) by mouth 2 (two) times daily., Disp: 60 tablet, Rfl: 5 .  traZODone (DESYREL) 100 MG tablet, Take 1 tablet (100 mg total) by mouth at bedtime. (Patient taking differently: Take 150 mg by mouth at bedtime. ), Disp: 30 tablet, Rfl: 0  EXAM:  Vitals:   04/05/17 1623  BP: 98/60  Pulse: 91  Temp: 98.3 F (36.8 C)    Body mass index is 41.93 kg/m.  GENERAL: vitals reviewed and listed above, alert, oriented, appears well hydrated and in no acute distress  HEENT: atraumatic, conjunttiva clear, no obvious abnormalities on inspection of external nose and ears  NECK: no obvious masses on inspection  LUNGS: clear to auscultation bilaterally, no wheezes, rales or rhonchi, good air movement  CV: HRRR, no peripheral edema  MS: moves all extremities without noticeable abnormality  ABD: BS+, soft, NTTP  PSYCH: pleasant and cooperative, no obvious depression or anxiety  ASSESSMENT AND PLAN:  Discussed the following assessment and plan:  Nausea  -we discussed possible serious and likely etiologies, workup and treatment, treatment risks and return precautions with side effect to the antibiotic most likely -after this discussion, Mckynna opted for urine pregnancy test, checking with her gynecologist about stopping or changing abx, follow up if symptoms worsen or persist off abx   Patient Instructions  BEFORE YOU LEAVE: -urine pregnancy  Please check with your gynecologist to see if you can stop the doxy as the  nausea may be a side effect.  I hope you are feeling better soon! Seek care immediately if worsening, new concerns or you are not improving once off the antibiotic.     Colin Benton R., DO

## 2017-04-05 NOTE — Addendum Note (Signed)
Addended by: Lahoma Crocker on: 04/05/2017 04:51 PM   Modules accepted: Orders

## 2017-04-05 NOTE — Patient Instructions (Signed)
BEFORE YOU LEAVE: -urine pregnancy  Please check with your gynecologist to see if you can stop the doxy as the nausea may be a side effect.  I hope you are feeling better soon! Seek care immediately if worsening, new concerns or you are not improving once off the antibiotic.

## 2017-04-12 ENCOUNTER — Ambulatory Visit (INDEPENDENT_AMBULATORY_CARE_PROVIDER_SITE_OTHER): Payer: BLUE CROSS/BLUE SHIELD | Admitting: Psychology

## 2017-04-12 DIAGNOSIS — F4312 Post-traumatic stress disorder, chronic: Secondary | ICD-10-CM

## 2017-04-12 DIAGNOSIS — F4481 Dissociative identity disorder: Secondary | ICD-10-CM | POA: Diagnosis not present

## 2017-04-13 DIAGNOSIS — E109 Type 1 diabetes mellitus without complications: Secondary | ICD-10-CM | POA: Diagnosis not present

## 2017-04-13 DIAGNOSIS — Z794 Long term (current) use of insulin: Secondary | ICD-10-CM | POA: Diagnosis not present

## 2017-04-13 DIAGNOSIS — I63512 Cerebral infarction due to unspecified occlusion or stenosis of left middle cerebral artery: Secondary | ICD-10-CM | POA: Diagnosis not present

## 2017-04-13 DIAGNOSIS — I639 Cerebral infarction, unspecified: Secondary | ICD-10-CM | POA: Diagnosis not present

## 2017-04-13 DIAGNOSIS — E108 Type 1 diabetes mellitus with unspecified complications: Secondary | ICD-10-CM | POA: Diagnosis not present

## 2017-04-16 ENCOUNTER — Ambulatory Visit (INDEPENDENT_AMBULATORY_CARE_PROVIDER_SITE_OTHER): Payer: BLUE CROSS/BLUE SHIELD | Admitting: Psychology

## 2017-04-16 DIAGNOSIS — F4481 Dissociative identity disorder: Secondary | ICD-10-CM

## 2017-04-16 DIAGNOSIS — F4312 Post-traumatic stress disorder, chronic: Secondary | ICD-10-CM | POA: Diagnosis not present

## 2017-04-18 ENCOUNTER — Ambulatory Visit (INDEPENDENT_AMBULATORY_CARE_PROVIDER_SITE_OTHER): Payer: BLUE CROSS/BLUE SHIELD | Admitting: Psychology

## 2017-04-18 DIAGNOSIS — F4312 Post-traumatic stress disorder, chronic: Secondary | ICD-10-CM

## 2017-04-18 DIAGNOSIS — F4481 Dissociative identity disorder: Secondary | ICD-10-CM | POA: Diagnosis not present

## 2017-04-19 ENCOUNTER — Ambulatory Visit (INDEPENDENT_AMBULATORY_CARE_PROVIDER_SITE_OTHER): Payer: BLUE CROSS/BLUE SHIELD | Admitting: Psychology

## 2017-04-19 DIAGNOSIS — E1349 Other specified diabetes mellitus with other diabetic neurological complication: Secondary | ICD-10-CM | POA: Diagnosis not present

## 2017-04-19 DIAGNOSIS — Z794 Long term (current) use of insulin: Secondary | ICD-10-CM | POA: Diagnosis not present

## 2017-04-19 DIAGNOSIS — F4481 Dissociative identity disorder: Secondary | ICD-10-CM | POA: Diagnosis not present

## 2017-04-19 DIAGNOSIS — I639 Cerebral infarction, unspecified: Secondary | ICD-10-CM | POA: Diagnosis not present

## 2017-04-19 DIAGNOSIS — G4733 Obstructive sleep apnea (adult) (pediatric): Secondary | ICD-10-CM | POA: Diagnosis not present

## 2017-04-19 DIAGNOSIS — F4312 Post-traumatic stress disorder, chronic: Secondary | ICD-10-CM

## 2017-04-20 DIAGNOSIS — X58XXXA Exposure to other specified factors, initial encounter: Secondary | ICD-10-CM | POA: Diagnosis not present

## 2017-04-20 DIAGNOSIS — T378X5A Adverse effect of other specified systemic anti-infectives and antiparasitics, initial encounter: Secondary | ICD-10-CM | POA: Diagnosis not present

## 2017-04-20 DIAGNOSIS — G4733 Obstructive sleep apnea (adult) (pediatric): Secondary | ICD-10-CM | POA: Diagnosis not present

## 2017-04-20 DIAGNOSIS — R21 Rash and other nonspecific skin eruption: Secondary | ICD-10-CM | POA: Diagnosis not present

## 2017-04-20 DIAGNOSIS — T7840XA Allergy, unspecified, initial encounter: Secondary | ICD-10-CM | POA: Diagnosis not present

## 2017-04-20 DIAGNOSIS — E119 Type 2 diabetes mellitus without complications: Secondary | ICD-10-CM | POA: Diagnosis not present

## 2017-04-20 DIAGNOSIS — E079 Disorder of thyroid, unspecified: Secondary | ICD-10-CM | POA: Diagnosis not present

## 2017-04-20 DIAGNOSIS — Z87891 Personal history of nicotine dependence: Secondary | ICD-10-CM | POA: Diagnosis not present

## 2017-04-21 DIAGNOSIS — E119 Type 2 diabetes mellitus without complications: Secondary | ICD-10-CM | POA: Diagnosis not present

## 2017-04-21 DIAGNOSIS — T7840XA Allergy, unspecified, initial encounter: Secondary | ICD-10-CM | POA: Diagnosis not present

## 2017-05-03 ENCOUNTER — Ambulatory Visit (INDEPENDENT_AMBULATORY_CARE_PROVIDER_SITE_OTHER): Payer: BLUE CROSS/BLUE SHIELD | Admitting: Psychology

## 2017-05-03 DIAGNOSIS — F4312 Post-traumatic stress disorder, chronic: Secondary | ICD-10-CM | POA: Diagnosis not present

## 2017-05-03 DIAGNOSIS — F4481 Dissociative identity disorder: Secondary | ICD-10-CM | POA: Diagnosis not present

## 2017-05-07 ENCOUNTER — Ambulatory Visit (INDEPENDENT_AMBULATORY_CARE_PROVIDER_SITE_OTHER): Payer: BLUE CROSS/BLUE SHIELD | Admitting: Psychology

## 2017-05-07 DIAGNOSIS — F4312 Post-traumatic stress disorder, chronic: Secondary | ICD-10-CM | POA: Diagnosis not present

## 2017-05-07 DIAGNOSIS — F4481 Dissociative identity disorder: Secondary | ICD-10-CM | POA: Diagnosis not present

## 2017-05-09 DIAGNOSIS — N926 Irregular menstruation, unspecified: Secondary | ICD-10-CM | POA: Diagnosis not present

## 2017-05-10 ENCOUNTER — Ambulatory Visit (INDEPENDENT_AMBULATORY_CARE_PROVIDER_SITE_OTHER): Payer: BLUE CROSS/BLUE SHIELD | Admitting: Psychology

## 2017-05-10 DIAGNOSIS — F4481 Dissociative identity disorder: Secondary | ICD-10-CM

## 2017-05-10 DIAGNOSIS — F4312 Post-traumatic stress disorder, chronic: Secondary | ICD-10-CM | POA: Diagnosis not present

## 2017-05-17 ENCOUNTER — Ambulatory Visit (INDEPENDENT_AMBULATORY_CARE_PROVIDER_SITE_OTHER): Payer: BLUE CROSS/BLUE SHIELD | Admitting: Psychology

## 2017-05-17 DIAGNOSIS — F4481 Dissociative identity disorder: Secondary | ICD-10-CM

## 2017-05-17 DIAGNOSIS — F4312 Post-traumatic stress disorder, chronic: Secondary | ICD-10-CM | POA: Diagnosis not present

## 2017-05-23 ENCOUNTER — Ambulatory Visit (INDEPENDENT_AMBULATORY_CARE_PROVIDER_SITE_OTHER): Payer: BLUE CROSS/BLUE SHIELD | Admitting: Family Medicine

## 2017-05-23 ENCOUNTER — Encounter: Payer: Self-pay | Admitting: Family Medicine

## 2017-05-23 VITALS — BP 100/80 | HR 76 | Temp 98.7°F | Ht 61.0 in | Wt 214.0 lb

## 2017-05-23 DIAGNOSIS — L0232 Furuncle of buttock: Secondary | ICD-10-CM | POA: Diagnosis not present

## 2017-05-23 MED ORDER — DOXYCYCLINE HYCLATE 100 MG PO CAPS: 100.0000 mg | ORAL_CAPSULE | Freq: Two times a day (BID) | ORAL | 0 refills | Status: DC

## 2017-05-23 NOTE — Progress Notes (Signed)
   Subjective:    Patient ID: Miranda [name], female    DOB: [birth year], 41 y.o.   MRN: [id number]  HPI Here for 2 weeks of a painful boil on the left buttock. It gets larger and then smaller. It has drained several times. Today it is smaller than usual. No fever.    Review of Systems  Constitutional: Negative.   Respiratory: Negative.   Cardiovascular: Negative.   Skin: Positive for wound.       Objective:   Physical Exam  Constitutional: She appears well-developed and well-nourished.  Cardiovascular: Normal rate, regular rhythm, normal heart sounds and intact distal pulses.   Pulmonary/Chest: Effort normal and breath sounds normal. No respiratory distress. She has no wheezes. She has no rales.  Skin:  The left buttock has a tender non-fluctuant boil about 6 cm from the anus. No surrounding erythema.           Assessment & Plan:  Boil, treat with hot compresses and Doxycycline. Recheck prn.  Alysia Penna, MD

## 2017-05-23 NOTE — Patient Instructions (Signed)
WE NOW OFFER   [facility] FAST TRACK!!!  SAME DAY Appointments for ACUTE CARE  Such as: Sprains, Injuries, cuts, abrasions, rashes, muscle pain, joint pain, back pain Colds, flu, sore throats, headache, allergies, cough, fever  Ear pain, sinus and eye infections Abdominal pain, nausea, vomiting, diarrhea, upset stomach Animal/insect bites  3 Easy Ways to Schedule: Walk-In Scheduling Call in scheduling Mychart Sign-up: [email or web address]         

## 2017-05-24 ENCOUNTER — Ambulatory Visit (INDEPENDENT_AMBULATORY_CARE_PROVIDER_SITE_OTHER): Payer: BLUE CROSS/BLUE SHIELD | Admitting: Psychology

## 2017-05-24 DIAGNOSIS — F4312 Post-traumatic stress disorder, chronic: Secondary | ICD-10-CM

## 2017-05-24 DIAGNOSIS — F4481 Dissociative identity disorder: Secondary | ICD-10-CM | POA: Diagnosis not present

## 2017-05-30 DIAGNOSIS — L600 Ingrowing nail: Secondary | ICD-10-CM | POA: Diagnosis not present

## 2017-05-30 DIAGNOSIS — E119 Type 2 diabetes mellitus without complications: Secondary | ICD-10-CM | POA: Diagnosis not present

## 2017-06-05 DIAGNOSIS — Z950 Presence of cardiac pacemaker: Secondary | ICD-10-CM | POA: Diagnosis not present

## 2017-06-13 DIAGNOSIS — F4311 Post-traumatic stress disorder, acute: Secondary | ICD-10-CM | POA: Diagnosis not present

## 2017-06-13 DIAGNOSIS — F449 Dissociative and conversion disorder, unspecified: Secondary | ICD-10-CM | POA: Diagnosis not present

## 2017-06-14 ENCOUNTER — Ambulatory Visit (INDEPENDENT_AMBULATORY_CARE_PROVIDER_SITE_OTHER): Payer: BLUE CROSS/BLUE SHIELD | Admitting: Psychology

## 2017-06-14 DIAGNOSIS — F4312 Post-traumatic stress disorder, chronic: Secondary | ICD-10-CM | POA: Diagnosis not present

## 2017-06-14 DIAGNOSIS — F4481 Dissociative identity disorder: Secondary | ICD-10-CM | POA: Diagnosis not present

## 2017-06-18 ENCOUNTER — Ambulatory Visit (INDEPENDENT_AMBULATORY_CARE_PROVIDER_SITE_OTHER): Payer: BLUE CROSS/BLUE SHIELD | Admitting: Psychology

## 2017-06-18 DIAGNOSIS — F4312 Post-traumatic stress disorder, chronic: Secondary | ICD-10-CM

## 2017-06-18 DIAGNOSIS — F4481 Dissociative identity disorder: Secondary | ICD-10-CM

## 2017-06-21 ENCOUNTER — Ambulatory Visit: Payer: Self-pay | Admitting: Psychology

## 2017-06-21 DIAGNOSIS — E119 Type 2 diabetes mellitus without complications: Secondary | ICD-10-CM | POA: Diagnosis not present

## 2017-06-21 DIAGNOSIS — Z794 Long term (current) use of insulin: Secondary | ICD-10-CM | POA: Diagnosis not present

## 2017-06-21 DIAGNOSIS — Z5181 Encounter for therapeutic drug level monitoring: Secondary | ICD-10-CM | POA: Diagnosis not present

## 2017-06-28 ENCOUNTER — Ambulatory Visit (INDEPENDENT_AMBULATORY_CARE_PROVIDER_SITE_OTHER): Payer: Self-pay | Admitting: Psychology

## 2017-06-28 DIAGNOSIS — F4312 Post-traumatic stress disorder, chronic: Secondary | ICD-10-CM

## 2017-06-28 DIAGNOSIS — F4481 Dissociative identity disorder: Secondary | ICD-10-CM | POA: Diagnosis not present

## 2017-07-03 ENCOUNTER — Ambulatory Visit (INDEPENDENT_AMBULATORY_CARE_PROVIDER_SITE_OTHER): Payer: Self-pay | Admitting: Psychology

## 2017-07-03 DIAGNOSIS — F4481 Dissociative identity disorder: Secondary | ICD-10-CM

## 2017-07-03 DIAGNOSIS — F4312 Post-traumatic stress disorder, chronic: Secondary | ICD-10-CM

## 2017-07-05 ENCOUNTER — Other Ambulatory Visit: Payer: Self-pay | Admitting: Family Medicine

## 2017-07-05 ENCOUNTER — Ambulatory Visit (INDEPENDENT_AMBULATORY_CARE_PROVIDER_SITE_OTHER): Payer: Self-pay | Admitting: Psychology

## 2017-07-05 ENCOUNTER — Telehealth: Payer: Self-pay | Admitting: Family Medicine

## 2017-07-05 DIAGNOSIS — F4312 Post-traumatic stress disorder, chronic: Secondary | ICD-10-CM

## 2017-07-05 DIAGNOSIS — F4481 Dissociative identity disorder: Secondary | ICD-10-CM

## 2017-07-05 NOTE — Telephone Encounter (Signed)
Pt request refill  omeprazole (PRILOSEC) 40 MG capsule  [facility] - [place],  - [address] AT [address]

## 2017-07-05 NOTE — Telephone Encounter (Signed)
Can we refill this? 

## 2017-07-05 NOTE — Telephone Encounter (Signed)
Can we refill this? Looks like another provider sent in script last.

## 2017-07-05 NOTE — Telephone Encounter (Signed)
See below note.

## 2017-07-06 NOTE — Telephone Encounter (Signed)
Patient called to check on rx refills. She states that she will contact Dr Fuller Plan for the Omeprazole. She would like to know about the Plavix refill as she "does not want to run out". Per pharmacy pt has not filled Plavix since 12/12/16.   Dr. Sarajane Jews - Please advise. Thanks!

## 2017-07-09 ENCOUNTER — Ambulatory Visit (INDEPENDENT_AMBULATORY_CARE_PROVIDER_SITE_OTHER): Payer: Self-pay | Admitting: Psychology

## 2017-07-09 DIAGNOSIS — F4312 Post-traumatic stress disorder, chronic: Secondary | ICD-10-CM

## 2017-07-09 DIAGNOSIS — F4481 Dissociative identity disorder: Secondary | ICD-10-CM

## 2017-07-09 MED ORDER — CLOPIDOGREL BISULFATE 75 MG PO TABS: 75.0000 mg | ORAL_TABLET | Freq: Every day | ORAL | 3 refills | Status: DC

## 2017-07-09 NOTE — Telephone Encounter (Signed)
I sent script e-scribe for Plavix to [facility] and spoke with pt.

## 2017-07-09 NOTE — Telephone Encounter (Signed)
Call in Plavix #90 with 3 rf

## 2017-07-10 ENCOUNTER — Other Ambulatory Visit: Payer: Self-pay | Admitting: Gastroenterology

## 2017-07-10 DIAGNOSIS — R1012 Left upper quadrant pain: Secondary | ICD-10-CM

## 2017-07-10 DIAGNOSIS — D125 Benign neoplasm of sigmoid colon: Secondary | ICD-10-CM

## 2017-07-10 DIAGNOSIS — R197 Diarrhea, unspecified: Secondary | ICD-10-CM

## 2017-07-10 DIAGNOSIS — K3189 Other diseases of stomach and duodenum: Secondary | ICD-10-CM

## 2017-07-10 NOTE — Telephone Encounter (Signed)
Refill what?

## 2017-07-10 NOTE — Telephone Encounter (Signed)
Done

## 2017-07-12 ENCOUNTER — Ambulatory Visit (INDEPENDENT_AMBULATORY_CARE_PROVIDER_SITE_OTHER): Payer: Self-pay | Admitting: Psychology

## 2017-07-12 DIAGNOSIS — F4312 Post-traumatic stress disorder, chronic: Secondary | ICD-10-CM

## 2017-07-12 DIAGNOSIS — F4481 Dissociative identity disorder: Secondary | ICD-10-CM

## 2017-07-19 ENCOUNTER — Encounter: Payer: Self-pay | Admitting: Family Medicine

## 2017-07-19 ENCOUNTER — Ambulatory Visit (INDEPENDENT_AMBULATORY_CARE_PROVIDER_SITE_OTHER): Payer: Self-pay | Admitting: Psychology

## 2017-07-19 DIAGNOSIS — F4312 Post-traumatic stress disorder, chronic: Secondary | ICD-10-CM

## 2017-07-19 DIAGNOSIS — F4481 Dissociative identity disorder: Secondary | ICD-10-CM

## 2017-07-23 ENCOUNTER — Ambulatory Visit (INDEPENDENT_AMBULATORY_CARE_PROVIDER_SITE_OTHER): Payer: Self-pay | Admitting: Psychology

## 2017-07-23 DIAGNOSIS — F4481 Dissociative identity disorder: Secondary | ICD-10-CM

## 2017-07-23 DIAGNOSIS — F4312 Post-traumatic stress disorder, chronic: Secondary | ICD-10-CM

## 2017-07-24 ENCOUNTER — Ambulatory Visit (INDEPENDENT_AMBULATORY_CARE_PROVIDER_SITE_OTHER): Payer: Self-pay | Admitting: Family Medicine

## 2017-07-24 ENCOUNTER — Encounter: Payer: Self-pay | Admitting: Family Medicine

## 2017-07-24 VITALS — BP 120/56 | Temp 98.5°F | Ht 61.0 in | Wt 205.0 lb

## 2017-07-24 DIAGNOSIS — J069 Acute upper respiratory infection, unspecified: Secondary | ICD-10-CM

## 2017-07-24 MED ORDER — METHYLPREDNISOLONE ACETATE 80 MG/ML IJ SUSP
120.0000 mg | Freq: Once | INTRAMUSCULAR | Status: AC
  Administered 2017-07-24: 120 mg via INTRAMUSCULAR

## 2017-07-24 MED ORDER — ALBUTEROL SULFATE HFA 108 (90 BASE) MCG/ACT IN AERS: 2.0000 | INHALATION_SPRAY | RESPIRATORY_TRACT | 11 refills | Status: DC | PRN

## 2017-07-24 MED ORDER — BUDESONIDE-FORMOTEROL FUMARATE 160-4.5 MCG/ACT IN AERO: 2.0000 | INHALATION_SPRAY | Freq: Two times a day (BID) | RESPIRATORY_TRACT | 11 refills | Status: AC

## 2017-07-24 NOTE — Patient Instructions (Signed)
WE NOW OFFER   [facility] FAST TRACK!!!  SAME DAY Appointments for ACUTE CARE  Such as: Sprains, Injuries, cuts, abrasions, rashes, muscle pain, joint pain, back pain Colds, flu, sore throats, headache, allergies, cough, fever  Ear pain, sinus and eye infections Abdominal pain, nausea, vomiting, diarrhea, upset stomach Animal/insect bites  3 Easy Ways to Schedule: Walk-In Scheduling Call in scheduling Mychart Sign-up: [email or web address]         

## 2017-07-24 NOTE — Addendum Note (Signed)
Addended by: Aggie Hacker A on: 07/24/2017 03:36 PM   Modules accepted: Orders

## 2017-07-24 NOTE — Progress Notes (Signed)
   Subjective:    Patient ID: Miranda [name], female    DOB: May 05, 1976, 41 y.o.   MRN: [id number]  HPI Here for 5 days of headache, ST, PND, and a dry cough. Drinking fluids. Using her Symbicort and Proair inhalers.    Review of Systems  Constitutional: Negative.   HENT: Positive for congestion, postnasal drip and sore throat. Negative for ear pain, sinus pain and sinus pressure.   Eyes: Negative.   Respiratory: Positive for cough.        Objective:   Physical Exam  Constitutional: She appears well-developed and well-nourished.  appears ill   HENT:  Right Ear: External ear normal.  Left Ear: External ear normal.  Nose: Nose normal.  Mouth/Throat: Oropharynx is clear and moist.  Eyes: Conjunctivae are normal.  Neck: Neck supple. No thyromegaly present.  Pulmonary/Chest: Effort normal and breath sounds normal. No respiratory distress. She has no wheezes. She has no rales.  Lymphadenopathy:    She has no cervical adenopathy.          Assessment & Plan:  Viral URI. Given a steroid shot. She will use the inhalers as above. Add Delsym for the cough.  Alysia Penna, MD

## 2017-07-26 ENCOUNTER — Ambulatory Visit (INDEPENDENT_AMBULATORY_CARE_PROVIDER_SITE_OTHER): Payer: Self-pay | Admitting: Psychology

## 2017-07-26 DIAGNOSIS — F4481 Dissociative identity disorder: Secondary | ICD-10-CM

## 2017-07-26 DIAGNOSIS — F4312 Post-traumatic stress disorder, chronic: Secondary | ICD-10-CM

## 2017-07-30 ENCOUNTER — Ambulatory Visit (INDEPENDENT_AMBULATORY_CARE_PROVIDER_SITE_OTHER): Payer: Self-pay | Admitting: Psychology

## 2017-07-30 DIAGNOSIS — F4312 Post-traumatic stress disorder, chronic: Secondary | ICD-10-CM

## 2017-07-30 DIAGNOSIS — F4481 Dissociative identity disorder: Secondary | ICD-10-CM

## 2017-08-02 ENCOUNTER — Ambulatory Visit (INDEPENDENT_AMBULATORY_CARE_PROVIDER_SITE_OTHER): Payer: Self-pay | Admitting: Psychology

## 2017-08-02 DIAGNOSIS — F4312 Post-traumatic stress disorder, chronic: Secondary | ICD-10-CM

## 2017-08-02 DIAGNOSIS — F4481 Dissociative identity disorder: Secondary | ICD-10-CM

## 2017-08-06 ENCOUNTER — Ambulatory Visit (INDEPENDENT_AMBULATORY_CARE_PROVIDER_SITE_OTHER): Payer: Self-pay | Admitting: Psychology

## 2017-08-06 DIAGNOSIS — F4481 Dissociative identity disorder: Secondary | ICD-10-CM

## 2017-08-06 DIAGNOSIS — F4312 Post-traumatic stress disorder, chronic: Secondary | ICD-10-CM

## 2017-08-09 ENCOUNTER — Ambulatory Visit (INDEPENDENT_AMBULATORY_CARE_PROVIDER_SITE_OTHER): Payer: Self-pay | Admitting: Psychology

## 2017-08-09 DIAGNOSIS — F4481 Dissociative identity disorder: Secondary | ICD-10-CM

## 2017-08-09 DIAGNOSIS — F4312 Post-traumatic stress disorder, chronic: Secondary | ICD-10-CM

## 2017-08-16 ENCOUNTER — Ambulatory Visit (INDEPENDENT_AMBULATORY_CARE_PROVIDER_SITE_OTHER): Payer: Self-pay | Admitting: Psychology

## 2017-08-16 DIAGNOSIS — F4481 Dissociative identity disorder: Secondary | ICD-10-CM

## 2017-08-16 DIAGNOSIS — F4312 Post-traumatic stress disorder, chronic: Secondary | ICD-10-CM

## 2017-08-20 ENCOUNTER — Ambulatory Visit: Payer: Self-pay | Admitting: Psychology

## 2017-08-23 ENCOUNTER — Ambulatory Visit (INDEPENDENT_AMBULATORY_CARE_PROVIDER_SITE_OTHER): Payer: Self-pay | Admitting: Psychology

## 2017-08-23 DIAGNOSIS — F4312 Post-traumatic stress disorder, chronic: Secondary | ICD-10-CM

## 2017-08-23 DIAGNOSIS — F481 Depersonalization-derealization syndrome: Secondary | ICD-10-CM

## 2017-08-24 ENCOUNTER — Encounter: Payer: Self-pay | Admitting: Family Medicine

## 2017-08-24 ENCOUNTER — Ambulatory Visit (INDEPENDENT_AMBULATORY_CARE_PROVIDER_SITE_OTHER): Payer: Self-pay | Admitting: Family Medicine

## 2017-08-24 VITALS — BP 110/70 | Temp 98.4°F | Ht 61.0 in | Wt 198.0 lb

## 2017-08-24 DIAGNOSIS — K3189 Other diseases of stomach and duodenum: Secondary | ICD-10-CM

## 2017-08-24 DIAGNOSIS — M25512 Pain in left shoulder: Secondary | ICD-10-CM

## 2017-08-24 DIAGNOSIS — R1012 Left upper quadrant pain: Secondary | ICD-10-CM

## 2017-08-24 DIAGNOSIS — R197 Diarrhea, unspecified: Secondary | ICD-10-CM

## 2017-08-24 DIAGNOSIS — G8929 Other chronic pain: Secondary | ICD-10-CM

## 2017-08-24 DIAGNOSIS — D125 Benign neoplasm of sigmoid colon: Secondary | ICD-10-CM

## 2017-08-24 MED ORDER — DICLOFENAC SODIUM 75 MG PO TBEC: 75.0000 mg | DELAYED_RELEASE_TABLET | Freq: Two times a day (BID) | ORAL | 0 refills | Status: DC

## 2017-08-24 MED ORDER — ROSUVASTATIN CALCIUM 20 MG PO TABS: ORAL_TABLET | ORAL | 11 refills | Status: DC

## 2017-08-24 NOTE — Patient Instructions (Signed)
WE NOW OFFER   [facility] FAST TRACK!!!  SAME DAY Appointments for ACUTE CARE  Such as: Sprains, Injuries, cuts, abrasions, rashes, muscle pain, joint pain, back pain Colds, flu, sore throats, headache, allergies, cough, fever  Ear pain, sinus and eye infections Abdominal pain, nausea, vomiting, diarrhea, upset stomach Animal/insect bites  3 Easy Ways to Schedule: Walk-In Scheduling Call in scheduling Mychart Sign-up: [email or web address]         

## 2017-08-24 NOTE — Progress Notes (Signed)
   Subjective:    Patient ID: Miranda [name], female    DOB: April 28, 1976, 41 y.o.   MRN: [id number]  HPI Here for 3 months of pain in the left shoulder that has been getting worse in the past week. No hx of trauma, but her job at [employer] requires a lot of continuous lifting and moving of heavy boxes. No pain or numbness down the arm. Using heat and Ibuprofen.    Review of Systems  Constitutional: Negative.   Respiratory: Negative.   Cardiovascular: Negative.   Musculoskeletal: Positive for arthralgias.       Objective:   Physical Exam  Constitutional: She appears well-developed and well-nourished.  Cardiovascular: Normal rate, regular rhythm, normal heart sounds and intact distal pulses.   Pulmonary/Chest: Effort normal and breath sounds normal.  Musculoskeletal:  The left shoulder is diffusely tender but more so in the anterior subacromial space. ROM is limited by pain.           Assessment & Plan:  Shoulder pain, consistent with rotator cuff tendonitis. Use ice packs and Diclofenac. Refer to Orthopedics.  Alysia Penna, MD

## 2017-08-27 ENCOUNTER — Ambulatory Visit (INDEPENDENT_AMBULATORY_CARE_PROVIDER_SITE_OTHER): Payer: Self-pay | Admitting: Psychology

## 2017-08-27 DIAGNOSIS — F4312 Post-traumatic stress disorder, chronic: Secondary | ICD-10-CM

## 2017-08-27 DIAGNOSIS — F4481 Dissociative identity disorder: Secondary | ICD-10-CM

## 2017-08-28 ENCOUNTER — Ambulatory Visit (INDEPENDENT_AMBULATORY_CARE_PROVIDER_SITE_OTHER): Payer: Self-pay | Admitting: Orthopaedic Surgery

## 2017-08-28 ENCOUNTER — Ambulatory Visit (INDEPENDENT_AMBULATORY_CARE_PROVIDER_SITE_OTHER): Payer: Self-pay

## 2017-08-28 ENCOUNTER — Encounter (INDEPENDENT_AMBULATORY_CARE_PROVIDER_SITE_OTHER): Payer: Self-pay | Admitting: Orthopaedic Surgery

## 2017-08-28 DIAGNOSIS — M25512 Pain in left shoulder: Secondary | ICD-10-CM

## 2017-08-28 DIAGNOSIS — G8929 Other chronic pain: Secondary | ICD-10-CM

## 2017-08-28 MED ORDER — METHYLPREDNISOLONE ACETATE 40 MG/ML IJ SUSP
40.0000 mg | INTRAMUSCULAR | Status: AC | PRN
  Administered 2017-08-28: 40 mg via INTRA_ARTICULAR

## 2017-08-28 MED ORDER — BUPIVACAINE HCL 0.5 % IJ SOLN
3.0000 mL | INTRAMUSCULAR | Status: AC | PRN
  Administered 2017-08-28: 3 mL via INTRA_ARTICULAR

## 2017-08-28 MED ORDER — LIDOCAINE HCL 1 % IJ SOLN
3.0000 mL | INTRAMUSCULAR | Status: AC | PRN
  Administered 2017-08-28: 3 mL

## 2017-08-28 NOTE — Progress Notes (Signed)
Office Visit Note   Patient: Miranda [name]           Date of Birth: Mar 13, 1976           MRN: [id number] Visit Date: 08/28/2017              Requested by: Laurey Morale, MD [place], [place] [address] PCP: Laurey Morale, MD   Assessment & Plan: Visit Diagnoses:  1. Chronic left shoulder pain     Plan: Impression is left shoulder pain and left carpal tunnel syndrome.  I think she has a combination of impingement and periscapular muscle trigger points.  I recommend physical therapy and modalities.  Subacromial injection was performed today.  If she is not better in about 4-6 weeks I recommend that she follow-up with [place].  Questions encouraged and answered.  Follow-up as needed.  Follow-Up Instructions: Return if symptoms worsen or fail to improve.   Orders:  Orders Placed This Encounter  Procedures  . XR Shoulder Left   No orders of the defined types were placed in this encounter.     Procedures: Large Joint Inj Date/Time: 08/28/2017 10:04 AM Performed by: Leandrew Koyanagi Authorized by: Leandrew Koyanagi   Consent Given by:  Patient Timeout: prior to procedure the correct patient, procedure, and site was verified   Location:  Shoulder Site:  L subacromial bursa Prep: patient was prepped and draped in usual sterile fashion   Needle Size:  22 G Approach:  Posterior Ultrasound Guidance: No   Fluoroscopic Guidance: No   Arthrogram: No   Medications:  3 mL lidocaine 1 %; 3 mL bupivacaine 0.5 %; 40 mg methylPREDNISolone acetate 40 MG/ML     Clinical Data: No additional findings.   Subjective: Chief Complaint  Patient presents with  . Left Shoulder - Pain    Miranda [name] is a healthy 41 year old female who comes in with left shoulder pain for several months.  She also has a history of left carpal tunnel syndrome.  She states that she has pain in her left shoulder and trapezius and scapular region with aching and burning pain.  The pain is worse with  use of the arm.  She does notice popping in her shoulder.  She has tried diclofenac which has helped.  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  She denies any recent injuries.    Review of Systems  Constitutional: Negative.   HENT: Negative.   Eyes: Negative.   Respiratory: Negative.   Cardiovascular: Negative.   Endocrine: Negative.   Musculoskeletal: Negative.   Neurological: Negative.   Hematological: Negative.   Psychiatric/Behavioral: Negative.   All other systems reviewed and are negative.    Objective: Vital Signs: There were no vitals taken for this visit.  Physical Exam  Constitutional: She is oriented to person, place, and time. She appears well-developed and well-nourished.  HENT:  Head: Normocephalic and atraumatic.  Eyes: EOM are normal.  Neck: Neck supple.  Pulmonary/Chest: Effort normal.  Abdominal: Soft.  Neurological: She is alert and oriented to person, place, and time.  Skin: Skin is warm. Capillary refill takes less than 2 seconds.  Psychiatric: She has a normal mood and affect. Her behavior is normal. Judgment and thought content normal.  Nursing note and vitals reviewed.   Ortho Exam Left shoulder exam shows positive impingement signs and positive cross adduction.  Rotator cuff is grossly intact.  She does have discomfort with palpation of the  trapezius and rhomboids.  Negative Spurling's. Specialty Comments:  No specialty comments available.  Imaging: No results found.   PMFS History: Patient Active Problem List   Diagnosis Date Noted  . Type 2 diabetes mellitus without complications ([place]) 20/25/4270  . PTSD (post-traumatic stress disorder) 10/25/2016  . 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 Diabetes mellitus ([place]) 10/25/2016  . Major depressive disorder, recurrent episode, severe, with psychosis ([place]) 10/20/2016  . Major depressive disorder, recurrent episode, severe, 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[place])  10/19/2016  . TIA (transient ischemic attack) 05/16/2016  . Arterial ischemic stroke, MCA, left, acute ([place]) 03/29/2016  . Plantar fasciitis 08/08/2013  . Obesity, Class III, BMI 40-49.9 (morbid obesity) ([place]) 04/09/2013  . Unspecified sleep apnea 03/31/2013  . PCO (polycystic ovaries) 04/11/2012  . NECK PAIN 11/21/2010  . FATTY LIVER DISEASE 01/26/2010  . NAUSEA WITH VOMITING 01/26/2010  . ABDOMINAL PAIN, EPIGASTRIC 01/26/2010  . PORTAL HYPERTENSION 01/26/2010  . ABDOMINAL PAIN, RIGHT UPPER QUADRANT 12/27/2009  . Migraine headache 06/28/2009  . ACUTE BRONCHITIS 06/08/2009  . ABDOMINAL PAIN, RIGHT LOWER QUADRANT 11/24/2008  . BREAST MASS 08/21/2008  . KNEE PAIN 04/14/2008  . Hypothyroidism 03/17/2008  . Hyperlipidemia 03/17/2008  . DEPRESSION 03/17/2008  . NEUROPATHY 03/17/2008  . Essential hypertension 03/17/2008  . ASTHMA 03/17/2008  . GERD 03/17/2008  . Headache 03/17/2008   Past Medical History:  Diagnosis Date  . Anxiety   . Arthritis   . Asthma   . Bowel obstruction ([place])   . Cardiac arrhythmia   . Depression    sees Dr. Matilde Haymaker in [place]   . Diabetes mellitus    type 2, on insulin pump, sees Dr. Loanne Drilling   . GERD (gastroesophageal reflux disease)   . Hyperlipidemia   . Hypertension   . Hypothyroidism    sees Dr. Elyse Hsu  . Insomnia   . Migraine syndrome   . Morbid obesity ([place])   . Multiple personality ([place])   . Neck pain   . Neuropathy associated with endocrine disorder ([place])   . Sleep apnea with use of continuous positive airway pressure (CPAP)    on CPAP  . 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 Stroke [facility]) 04/2016    Family History  Problem Relation Age of Onset  . Adopted: Yes  . Heart disease Unknown        on both sides of family  . Diabetes Unknown        on both sides of family    Past Surgical History:  Procedure Laterality Date  . blocked itestinal repair  46   age 61 months  . BREAST BIOPSY Left 2017  . CHOLECYSTECTOMY   2011  . INDUCED ABORTION  1996   forced abortion  . INGUINAL HERNIA REPAIR Left 1980   age 26  . LAPAROSCOPIC ENDOMETRIOSIS FULGURATION  1998  . WISDOM TOOTH EXTRACTION  2007   Social History   Occupational History  . Nurse, learning disability    Social History Main Topics  . Smoking status: Former Smoker    Packs/day: 0.00    Types: Cigarettes  . Smokeless tobacco: Never Used     Comment: quit 03/25/2016  . Alcohol use No  . Drug use: No  . Sexual activity: Yes

## 2017-08-30 ENCOUNTER — Ambulatory Visit (INDEPENDENT_AMBULATORY_CARE_PROVIDER_SITE_OTHER): Payer: Self-pay | Admitting: Psychology

## 2017-08-30 DIAGNOSIS — F4481 Dissociative identity disorder: Secondary | ICD-10-CM

## 2017-08-30 DIAGNOSIS — F4312 Post-traumatic stress disorder, chronic: Secondary | ICD-10-CM

## 2017-09-03 ENCOUNTER — Ambulatory Visit (INDEPENDENT_AMBULATORY_CARE_PROVIDER_SITE_OTHER): Payer: Self-pay | Admitting: Psychology

## 2017-09-03 DIAGNOSIS — F4481 Dissociative identity disorder: Secondary | ICD-10-CM

## 2017-09-03 DIAGNOSIS — F4312 Post-traumatic stress disorder, chronic: Secondary | ICD-10-CM

## 2017-09-06 ENCOUNTER — Ambulatory Visit (INDEPENDENT_AMBULATORY_CARE_PROVIDER_SITE_OTHER): Payer: Self-pay | Admitting: Psychology

## 2017-09-06 DIAGNOSIS — F4481 Dissociative identity disorder: Secondary | ICD-10-CM

## 2017-09-06 DIAGNOSIS — F4312 Post-traumatic stress disorder, chronic: Secondary | ICD-10-CM

## 2017-09-10 ENCOUNTER — Ambulatory Visit (INDEPENDENT_AMBULATORY_CARE_PROVIDER_SITE_OTHER): Payer: Self-pay | Admitting: Psychology

## 2017-09-10 DIAGNOSIS — F4312 Post-traumatic stress disorder, chronic: Secondary | ICD-10-CM

## 2017-09-10 DIAGNOSIS — F4481 Dissociative identity disorder: Secondary | ICD-10-CM

## 2017-09-12 ENCOUNTER — Telehealth (INDEPENDENT_AMBULATORY_CARE_PROVIDER_SITE_OTHER): Payer: Self-pay | Admitting: Orthopaedic Surgery

## 2017-09-12 ENCOUNTER — Telehealth: Payer: Self-pay | Admitting: *Deleted

## 2017-09-12 NOTE — Telephone Encounter (Signed)
Patient called asking for a note to be faxed to her employer putting her on light duty since she does a lot of heavy lifting. She will call back tomorrow with the fax number.

## 2017-09-12 NOTE — Telephone Encounter (Signed)
Copied from [place]. Topic: General - Other >> Sep 12, 2017 12:19 PM  Stare wrote: Reason for CRM: pt call to say she was referred to  orthopedic who then referred pt to [facility] . 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 She ask them at work for light duty and is asking for a note to do light duty until she finishes   PT    [phone]   >> Sep 12, 2017  4:11 PM Ahmed Prima L wrote: Pt just called back regarding her note. Please advise

## 2017-09-12 NOTE — Telephone Encounter (Signed)
Copied from [place]. Topic: General - Other >> Sep 12, 2017 12:19 PM  Stare wrote: Reason for CRM: pt call to say she was referred to  orthopedic who then referred pt to [facility] . 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 She ask them at work for light duty and is asking for a note to do light duty until she finishes   PT    [phone]

## 2017-09-13 ENCOUNTER — Ambulatory Visit (INDEPENDENT_AMBULATORY_CARE_PROVIDER_SITE_OTHER): Payer: Self-pay | Admitting: Psychology

## 2017-09-13 ENCOUNTER — Telehealth (INDEPENDENT_AMBULATORY_CARE_PROVIDER_SITE_OTHER): Payer: Self-pay | Admitting: Orthopaedic Surgery

## 2017-09-13 DIAGNOSIS — F4312 Post-traumatic stress disorder, chronic: Secondary | ICD-10-CM

## 2017-09-13 DIAGNOSIS — F4481 Dissociative identity disorder: Secondary | ICD-10-CM

## 2017-09-13 DIAGNOSIS — G5602 Carpal tunnel syndrome, left upper limb: Secondary | ICD-10-CM

## 2017-09-13 NOTE — Telephone Encounter (Signed)
I spoke with pt and sent over message.

## 2017-09-13 NOTE — Telephone Encounter (Signed)
Patient would also like to know if she needs to schedule another appointment.  She stated that she did not get any relief from her cortisone injection and has just started PT on Tuesday.  Does Dr. Erlinda Hong want her to continue doing PT before scheduling a new appointment.  She said that her hands are numb all the time.  CB#[phone].  Thank you.

## 2017-09-13 NOTE — Telephone Encounter (Signed)
Note ready for pick up at the front desk.  

## 2017-09-13 NOTE — Telephone Encounter (Signed)
Patient called stating she will pick up the letter instead of having it faxed to her employer. Patient said the letter need to state any and all restrictions she may have. Patient advised her employer asked her to hand deliver the letter. The number to contact patient is [phone]

## 2017-09-13 NOTE — Telephone Encounter (Signed)
See message below.  Note made. Please advise on the rest. Thank you.

## 2017-09-13 NOTE — Telephone Encounter (Signed)
We should get a nerve conduction study to see how bad her carpal tunnel syndrome is

## 2017-09-13 NOTE — Telephone Encounter (Signed)
This note should come from the doctor that is managing this problem (her orthopedist)

## 2017-09-13 NOTE — Telephone Encounter (Signed)
For 6 weeks

## 2017-09-13 NOTE — Telephone Encounter (Signed)
See message if so, how long?

## 2017-09-14 NOTE — Telephone Encounter (Signed)
Ok no lifting more than 10 lbs

## 2017-09-14 NOTE — Telephone Encounter (Signed)
Note printed. At front desk for pick up. Patient advised.

## 2017-09-14 NOTE — Telephone Encounter (Signed)
Order entered for nerve conduction study. Patient advised. Patient also advised that note for work is up at front desk for pick up. She needs for the note to have specific restrictions if possible and not just say light duty. 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 She will need an updated note if possible. Please call to advise.

## 2017-09-15 ENCOUNTER — Other Ambulatory Visit: Payer: Self-pay

## 2017-09-15 ENCOUNTER — Emergency Department (HOSPITAL_BASED_OUTPATIENT_CLINIC_OR_DEPARTMENT_OTHER)
Admission: EM | Admit: 2017-09-15 | Discharge: 2017-09-15 | Disposition: A | Discharge status: Home / Self Care | Payer: Worker's Compensation | Attending: Emergency Medicine | Admitting: Emergency Medicine

## 2017-09-15 ENCOUNTER — Emergency Department (HOSPITAL_BASED_OUTPATIENT_CLINIC_OR_DEPARTMENT_OTHER): Payer: Worker's Compensation

## 2017-09-15 ENCOUNTER — Encounter (HOSPITAL_BASED_OUTPATIENT_CLINIC_OR_DEPARTMENT_OTHER): Payer: Self-pay | Admitting: *Deleted

## 2017-09-15 DIAGNOSIS — Z794 Long term (current) use of insulin: Secondary | ICD-10-CM | POA: Diagnosis not present

## 2017-09-15 DIAGNOSIS — Z7902 Long term (current) use of antithrombotics/antiplatelets: Secondary | ICD-10-CM | POA: Insufficient documentation

## 2017-09-15 DIAGNOSIS — Y990 Civilian activity done for income or pay: Secondary | ICD-10-CM | POA: Insufficient documentation

## 2017-09-15 DIAGNOSIS — Y9289 Other specified places as the place of occurrence of the external cause: Secondary | ICD-10-CM | POA: Insufficient documentation

## 2017-09-15 DIAGNOSIS — Z87891 Personal history of nicotine dependence: Secondary | ICD-10-CM | POA: Diagnosis not present

## 2017-09-15 DIAGNOSIS — J45909 Unspecified asthma, uncomplicated: Secondary | ICD-10-CM | POA: Diagnosis not present

## 2017-09-15 DIAGNOSIS — S060X0A Concussion without loss of consciousness, initial encounter: Secondary | ICD-10-CM | POA: Insufficient documentation

## 2017-09-15 DIAGNOSIS — Z79899 Other long term (current) drug therapy: Secondary | ICD-10-CM | POA: Diagnosis not present

## 2017-09-15 DIAGNOSIS — E039 Hypothyroidism, unspecified: Secondary | ICD-10-CM | POA: Insufficient documentation

## 2017-09-15 DIAGNOSIS — E1140 Type 2 diabetes mellitus with diabetic neuropathy, unspecified: Secondary | ICD-10-CM | POA: Diagnosis not present

## 2017-09-15 DIAGNOSIS — R110 Nausea: Secondary | ICD-10-CM | POA: Diagnosis not present

## 2017-09-15 DIAGNOSIS — I10 Essential (primary) hypertension: Secondary | ICD-10-CM | POA: Diagnosis not present

## 2017-09-15 DIAGNOSIS — Z9641 Presence of insulin pump (external) (internal): Secondary | ICD-10-CM | POA: Diagnosis not present

## 2017-09-15 DIAGNOSIS — Y9389 Activity, other specified: Secondary | ICD-10-CM | POA: Diagnosis not present

## 2017-09-15 DIAGNOSIS — W208XXA Other cause of strike by thrown, projected or falling object, initial encounter: Secondary | ICD-10-CM | POA: Diagnosis not present

## 2017-09-15 DIAGNOSIS — R42 Dizziness and giddiness: Secondary | ICD-10-CM | POA: Insufficient documentation

## 2017-09-15 DIAGNOSIS — R51 Headache: Secondary | ICD-10-CM | POA: Insufficient documentation

## 2017-09-15 DIAGNOSIS — S0990XA Unspecified injury of head, initial encounter: Secondary | ICD-10-CM

## 2017-09-15 DIAGNOSIS — Z8673 Personal history of transient ischemic attack (TIA), and cerebral infarction without residual deficits: Secondary | ICD-10-CM | POA: Diagnosis not present

## 2017-09-15 MED ORDER — PROMETHAZINE HCL 25 MG PO TABS: 25.0000 mg | ORAL_TABLET | Freq: Four times a day (QID) | ORAL | 0 refills | Status: DC | PRN

## 2017-09-15 MED ORDER — IBUPROFEN 800 MG PO TABS: 800.0000 mg | ORAL_TABLET | Freq: Three times a day (TID) | ORAL | 0 refills | Status: DC | PRN

## 2017-09-15 NOTE — ED Triage Notes (Addendum)
Patient states she was at work and lifted a trailer floor flap, secured the flap and checked it.  States when she walked in to the trailer the floor flap fell and hit her on the top left head.  States she continued to attempt to work for several hours before she developed nausea and double vision.  Continues to have nausea, which is improving, the double vision is improved.

## 2017-09-15 NOTE — ED Provider Notes (Signed)
[facility]GENC[facility] DEPARTMENT Provider Note   CSN: [id number] Arrival date & time: 09/15/17  2993     History   Chief Complaint Chief Complaint  Patient presents with  . Head Injury    HPI Miranda [name] is a 41 y.o. female.  HPI Patient presents to the emergency department with a head injury that occurred at work. the patient states that she works for [employer] and 1 of the doors to a trailer came down and hit her on the top of the head.  She states that she does not think she lost consciousness and her coworkers did not report that she lost consciousness.  She states that she did not see stars and felt dizzy immediately after she states she also developed nausea and headache.  The patient states that she did not take any medications prior to arrival.  She states that as the shift went on she felt increasingly more dizzy and did start noticing some double vision.  Patient denies chest pain shortness of breath neck pain weakness numbness back pain vomiting or syncope.  Past Medical History:  Diagnosis Date  . Anxiety   . Arthritis   . Asthma   . Bowel obstruction ([place])   . Cardiac arrhythmia   . Depression    sees Dr. Matilde Haymaker in [place]   . Diabetes mellitus    type 2, on insulin pump, sees Dr. Loanne Drilling   . GERD (gastroesophageal reflux disease)   . Hyperlipidemia   . Hypertension   . Hypothyroidism    sees Dr. Elyse Hsu  . Insomnia   . Migraine syndrome   . Morbid obesity ([place])   . 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 Neck pain   . Neuropathy associated with endocrine disorder ([place])   . Sleep apnea with use of continuous positive airway pressure (CPAP)    on CPAP  . Stroke [facility]) 03-25-16   left MCA   . Stroke [facility]) 04/2016    Patient Active Problem List   Diagnosis Date Noted  . Type 2 diabetes mellitus without complications ([place]) 71/69/6789  . PTSD (post-traumatic stress disorder) 10/25/2016  . 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  Diabetes mellitus ([place]) 10/25/2016  . Major depressive disorder, recurrent episode, severe, with psychosis ([place]) 10/20/2016  . Major depressive disorder, recurrent episode, severe, 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[place]) 10/19/2016  . TIA (transient ischemic attack) 05/16/2016  . Arterial ischemic stroke, MCA, left, acute ([place]) 03/29/2016  . Plantar fasciitis 08/08/2013  . Obesity, Class III, BMI 40-49.9 (morbid obesity) ([place]) 04/09/2013  . Unspecified sleep apnea 03/31/2013  . PCO (polycystic ovaries) 04/11/2012  . NECK PAIN 11/21/2010  . FATTY LIVER DISEASE 01/26/2010  . NAUSEA WITH VOMITING 01/26/2010  . ABDOMINAL PAIN, EPIGASTRIC 01/26/2010  . PORTAL HYPERTENSION 01/26/2010  . ABDOMINAL PAIN, RIGHT UPPER QUADRANT 12/27/2009  . Migraine headache 06/28/2009  . ACUTE BRONCHITIS 06/08/2009  . ABDOMINAL PAIN, RIGHT LOWER QUADRANT 11/24/2008  . BREAST MASS 08/21/2008  . KNEE PAIN 04/14/2008  . Hypothyroidism 03/17/2008  . Hyperlipidemia 03/17/2008  . DEPRESSION 03/17/2008  . NEUROPATHY 03/17/2008  . Essential hypertension 03/17/2008  . ASTHMA 03/17/2008  . GERD 03/17/2008  . Headache 03/17/2008    Past Surgical History:  Procedure Laterality Date  . blocked itestinal repair  52   age 60 months  . BREAST BIOPSY Left 2017  . CHOLECYSTECTOMY  2011  . INDUCED ABORTION  1996   forced abortion  . INGUINAL HERNIA REPAIR Left 1980   age  3  . LAPAROSCOPIC ENDOMETRIOSIS FULGURATION  1998  . [place] EXTRACTION  2007    OB History    No data available       Home Medications    Prior to Admission medications   Medication Sig Start Date End Date Taking? Authorizing Provider  albuterol (PROAIR HFA) 108 (90 Base) MCG/ACT inhaler Inhale 2 puffs into the lungs every 4 (four) hours as needed for wheezing. 07/24/17   Laurey Morale, MD  ARIPiprazole (ABILIFY) 10 MG tablet Take 1 tablet (10 mg total) by mouth at bedtime. Patient taking differently: Take 15 mg by mouth at bedtime.   10/27/16   Kerrie Buffalo, NP  budesonide-formoterol [facility]) 160-4.5 MCG/ACT inhaler Inhale 2 puffs into the lungs 2 (two) times daily. 07/24/17   Laurey Morale, MD  buPROPion (WELLBUTRIN XL) 300 MG 24 hr tablet Take 1 tablet (300 mg total) by mouth daily. 10/28/16   Kerrie Buffalo, NP  clopidogrel (PLAVIX) 75 MG tablet Take 1 tablet (75 mg total) by mouth daily. 07/09/17   Laurey Morale, MD  diclofenac (VOLTAREN) 75 MG EC tablet Take 1 tablet (75 mg total) by mouth 2 (two) times daily. 08/24/17   Laurey Morale, MD  hydrOXYzine (ATARAX/VISTARIL) 50 MG tablet Take 50 mg by mouth. Take 1 tablet every night and may take 3 other times as needed    [provider]  Insulin Human (INSULIN PUMP) SOLN Inject into the skin. novolog    [provider]  levothyroxine (SYNTHROID, LEVOTHROID) 125 MCG tablet Take 1 tablet (125 mcg total) by mouth daily before breakfast. 10/28/16   Kerrie Buffalo, NP  metFORMIN (GLUCOPHAGE) 1000 MG tablet Take 1 tablet (1,000 mg total) by mouth 2 (two) times daily with a meal. 10/27/16   Kerrie Buffalo, NP  metoprolol succinate (TOPROL-XL) 25 MG 24 hr tablet Take 0.5 tablets (12.5 mg total) by mouth daily. 10/28/16   Kerrie Buffalo, NP  Multiple Vitamin (MULTIVITAMIN) tablet Take 1 tablet by mouth daily.    [provider]  pioglitazone (ACTOS) 45 MG tablet Take 1 tablet (45 mg total) by mouth daily. 10/28/16   Kerrie Buffalo, NP  prazosin (MINIPRESS) 5 MG capsule Take 10 mg by mouth at bedtime.    [provider]  rizatriptan (MAXALT) 10 MG tablet Take 1 tablet (10 mg total) by mouth as needed for migraine. May repeat in 2 hours if needed 01/04/17   Laurey Morale, MD  rosuvastatin (CRESTOR) 20 MG tablet TAKE 1 TABLET(20 MG) BY MOUTH DAILY 08/24/17   Laurey Morale, MD  sertraline (ZOLOFT) 100 MG tablet Take 200 mg by mouth daily.    [provider]  topiramate (TOPAMAX) 100 MG tablet Take 1 tablet (100 mg total) by mouth 2  (two) times daily. 01/14/16   Laurey Morale, MD  traZODone (DESYREL) 100 MG tablet Take 1 tablet (100 mg total) by mouth at bedtime. Patient taking differently: Take 150 mg by mouth at bedtime.  10/27/16   Kerrie Buffalo, NP    Family History Family History  Adopted: Yes  Problem Relation Age of Onset  . Heart disease Unknown        on both sides of family  . Diabetes Unknown        on both sides of family    Social History Social History   Tobacco Use  . Smoking status: Former Smoker    Packs/day: 0.00    Types: Cigarettes  . Smokeless tobacco: Never  Used  . Tobacco comment: quit 03/25/2016  Substance Use Topics  . Alcohol use: No    Alcohol/week: 0.0 oz  . Drug use: No     Allergies   Macrobid [nitrofurantoin]; Byetta 10 mcg pen [exenatide]; Clindamycin/lincomycin; Geodon [ziprasidone hcl]; Lipitor [atorvastatin]; Penicillins; Avocado; and Tramadol hcl   Review of Systems Review of Systems All other systems negative except as documented in the HPI. All pertinent positives and negatives as reviewed in the HPI.  Physical Exam Updated Vital Signs There were no vitals taken for this visit.  Physical Exam  Constitutional: She is oriented to person, place, and time. She appears well-developed and well-nourished. No distress.  HENT:  Head: Normocephalic and atraumatic.  Mouth/Throat: Oropharynx is clear and moist.  Eyes: Pupils are equal, round, and reactive to light.  Neck: Normal range of motion. Neck supple.  Cardiovascular: Normal rate, regular rhythm and normal heart sounds. Exam reveals no gallop and no friction rub.  No murmur heard. Pulmonary/Chest: Effort normal and breath sounds normal. No respiratory distress. She has no wheezes.  Neurological: She is alert and oriented to person, place, and time. She exhibits normal muscle tone. Coordination normal.  Skin: Skin is warm and dry. Capillary refill takes less than 2 seconds. No rash noted. No erythema.    Psychiatric: She has a normal mood and affect. Her behavior is normal.  Nursing note and vitals reviewed.    ED Treatments / Results  Labs (all labs ordered are listed, but only abnormal results are displayed) Labs Reviewed - No data to display  EKG  EKG Interpretation None       Radiology No results found.  Procedures Procedures (including critical care time)  Medications Ordered in ED Medications - No data to display   Initial Impression / Assessment and Plan / ED Course  I have reviewed the triage vital signs and the nursing notes.  Pertinent labs & imaging results that were available during my care of the patient were reviewed by me and considered in my medical decision making (see chart for details).    The patient did take a substantial blow to the head and does have significant medical problems will get a CT scan to rule out any further intracranial issues.  I do feel that the patient at least has a concussion.  Patient is advised the plan and all questions are answered.  Patient CT scan was reviewed and there is no significant abnormalities noted.  The patient will be advised to return here as needed.  Told to follow-up with her primary care doctor.  I did educate the patient on postconcussive symptoms.  Patient agrees to the plan and all questions were answered. Final Clinical Impressions(s) / ED Diagnoses   Final diagnoses:  None    ED Discharge Orders    None       Dalia Heading, PA-C 09/15/17 1055    Isla Pence, MD 09/15/17 [phone]

## 2017-09-15 NOTE — Discharge Instructions (Signed)
Return here as needed.  Your CT scan did not show any acute abnormalities.  Follow-up with your primary care doctor.

## 2017-09-17 ENCOUNTER — Ambulatory Visit (INDEPENDENT_AMBULATORY_CARE_PROVIDER_SITE_OTHER): Payer: Self-pay | Admitting: Psychology

## 2017-09-17 DIAGNOSIS — F4481 Dissociative identity disorder: Secondary | ICD-10-CM

## 2017-09-17 DIAGNOSIS — F4312 Post-traumatic stress disorder, chronic: Secondary | ICD-10-CM

## 2017-09-24 ENCOUNTER — Ambulatory Visit (INDEPENDENT_AMBULATORY_CARE_PROVIDER_SITE_OTHER): Payer: Self-pay | Admitting: Psychology

## 2017-09-24 DIAGNOSIS — F4312 Post-traumatic stress disorder, chronic: Secondary | ICD-10-CM

## 2017-09-24 DIAGNOSIS — F4481 Dissociative identity disorder: Secondary | ICD-10-CM

## 2017-09-26 ENCOUNTER — Encounter: Payer: Self-pay | Admitting: Family Medicine

## 2017-09-26 ENCOUNTER — Ambulatory Visit (INDEPENDENT_AMBULATORY_CARE_PROVIDER_SITE_OTHER): Payer: Self-pay | Admitting: Family Medicine

## 2017-09-26 VITALS — BP 120/80 | HR 79 | Temp 98.8°F | Wt 195.6 lb

## 2017-09-26 DIAGNOSIS — J209 Acute bronchitis, unspecified: Secondary | ICD-10-CM

## 2017-09-26 DIAGNOSIS — Z23 Encounter for immunization: Secondary | ICD-10-CM

## 2017-09-26 DIAGNOSIS — R6889 Other general symptoms and signs: Secondary | ICD-10-CM

## 2017-09-26 LAB — POC INFLUENZA A&B (BINAX/QUICKVUE): Influenza A, POC: NEGATIVE

## 2017-09-26 MED ORDER — HYDROCODONE-HOMATROPINE 5-1.5 MG/5ML PO SYRP: 5.0000 mL | ORAL_SOLUTION | ORAL | 0 refills | Status: DC | PRN

## 2017-09-26 MED ORDER — METHYLPREDNISOLONE ACETATE 40 MG/ML IJ SUSP
40.0000 mg | Freq: Once | INTRAMUSCULAR | Status: AC
  Administered 2017-09-26: 40 mg via INTRAMUSCULAR

## 2017-09-26 MED ORDER — LEVOTHYROXINE SODIUM 125 MCG PO TABS: 125.0000 ug | ORAL_TABLET | Freq: Every day | ORAL | 3 refills | Status: AC

## 2017-09-26 MED ORDER — AZITHROMYCIN 250 MG PO TABS: ORAL_TABLET | ORAL | 0 refills | Status: DC

## 2017-09-26 NOTE — Addendum Note (Signed)
Addended by: Myriam Forehand on: 09/26/2017 12:20 PM   Modules accepted: Orders

## 2017-09-26 NOTE — Progress Notes (Signed)
   Subjective:    Patient ID: Miranda [name], female    DOB: April 10, 1976, 41 y.o.   MRN: [id number]  HPI Here for 10 days of stuffy head, PND, chest tightness and coughing up yellow sputum. No fever. Using her inhalers.    Review of Systems  Constitutional: Negative.   HENT: Positive for congestion and postnasal drip. Negative for sinus pressure, sinus pain and sore throat.   Eyes: Negative.   Respiratory: Positive for cough and chest tightness.        Objective:   Physical Exam  Constitutional: She appears well-developed and well-nourished.  HENT:  Right Ear: External ear normal.  Left Ear: External ear normal.  Nose: Nose normal.  Mouth/Throat: Oropharynx is clear and moist.  Eyes: Conjunctivae are normal.  Neck: No thyromegaly present.  Pulmonary/Chest: Effort normal. No respiratory distress. She has no rales.  Scattered wheezes and rhonchi   Lymphadenopathy:    She has no cervical adenopathy.          Assessment & Plan:  Bronchitis, treat with a Zpack and a steroid shot. Written out of work 09-25-17 until 09-29-17. Alysia Penna, MD

## 2017-09-27 ENCOUNTER — Ambulatory Visit: Payer: Self-pay | Admitting: Psychology

## 2017-10-01 ENCOUNTER — Ambulatory Visit: Payer: Self-pay | Admitting: Psychology

## 2017-10-01 ENCOUNTER — Encounter: Payer: Self-pay | Admitting: Family Medicine

## 2017-10-01 ENCOUNTER — Ambulatory Visit (INDEPENDENT_AMBULATORY_CARE_PROVIDER_SITE_OTHER): Payer: Self-pay | Admitting: Family Medicine

## 2017-10-01 VITALS — BP 100/60 | HR 81 | Temp 97.6°F | Wt 193.4 lb

## 2017-10-01 DIAGNOSIS — D509 Iron deficiency anemia, unspecified: Secondary | ICD-10-CM

## 2017-10-01 DIAGNOSIS — J209 Acute bronchitis, unspecified: Secondary | ICD-10-CM

## 2017-10-01 NOTE — Progress Notes (Signed)
   Subjective:    Patient ID: Miranda [name], female    DOB: 1976/08/11, 41 y.o.   MRN: [id number]  HPI Here for continuing chest congestion and cough. She was here 10 days ago and she had rhonchi and wheezing on exam. She was given a Zpack and a Medrol dose pack. She has improved a bit however. The cough produces clear sputum.    Review of Systems  Constitutional: Negative.   HENT: Negative.   Eyes: Negative.   Respiratory: Positive for cough. Negative for chest tightness, shortness of breath and wheezing.   Cardiovascular: Negative.        Objective:   Physical Exam  Constitutional: She appears well-developed and well-nourished.  HENT:  Right Ear: External ear normal.  Left Ear: External ear normal.  Nose: Nose normal.  Mouth/Throat: Oropharynx is clear and moist.  Eyes: Conjunctivae are normal.  Neck: Neck supple. No thyromegaly present.  Pulmonary/Chest: Effort normal. No respiratory distress. She has no wheezes. She has no rales.  Lymphadenopathy:    She has no cervical adenopathy.          Assessment & Plan:  Partially treated bronchitis. She seems to be getting over this, but she just needs a little more time. We will write her out of work on 10-02-17 and 10-03-17. Recheck prn. Alysia Penna, MD

## 2017-10-02 LAB — CBC WITH DIFFERENTIAL/PLATELET
BASOS ABS: 0.1 10*3/uL (ref 0.0–0.1)
Basophils Relative: 0.9 % (ref 0.0–3.0)
Eosinophils Absolute: 0.2 10*3/uL (ref 0.0–0.7)
Eosinophils Relative: 1.6 % (ref 0.0–5.0)
HEMATOCRIT: 40.1 % (ref 36.0–46.0)
HEMOGLOBIN: 13.7 g/dL (ref 12.0–15.0)
LYMPHS ABS: 3.8 10*3/uL (ref 0.7–4.0)
LYMPHS PCT: 38.6 % (ref 12.0–46.0)
MCHC: 34.1 g/dL (ref 30.0–36.0)
MCV: 93.5 fl (ref 78.0–100.0)
Monocytes Absolute: 0.6 10*3/uL (ref 0.1–1.0)
Monocytes Relative: 6.6 % (ref 3.0–12.0)
NEUTROS PCT: 52.3 % (ref 43.0–77.0)
Neutro Abs: 5.1 10*3/uL (ref 1.4–7.7)
Platelets: 236 10*3/uL (ref 150.0–400.0)
RBC: 4.29 Mil/uL (ref 3.87–5.11)
RDW: 13.8 % (ref 11.5–15.5)
WBC: 9.7 10*3/uL (ref 4.0–10.5)

## 2017-10-02 LAB — FERRITIN: Ferritin: 23 ng/mL (ref 10.0–291.0)

## 2017-10-03 ENCOUNTER — Ambulatory Visit (INDEPENDENT_AMBULATORY_CARE_PROVIDER_SITE_OTHER): Payer: Self-pay | Admitting: Physical Medicine and Rehabilitation

## 2017-10-03 ENCOUNTER — Ambulatory Visit (INDEPENDENT_AMBULATORY_CARE_PROVIDER_SITE_OTHER): Payer: Self-pay | Admitting: Psychology

## 2017-10-03 DIAGNOSIS — R202 Paresthesia of skin: Secondary | ICD-10-CM

## 2017-10-03 DIAGNOSIS — F4481 Dissociative identity disorder: Secondary | ICD-10-CM

## 2017-10-03 DIAGNOSIS — F4312 Post-traumatic stress disorder, chronic: Secondary | ICD-10-CM

## 2017-10-03 NOTE — Progress Notes (Deleted)
Here today for nerve study on left upper extremity. She is having numbness and tingling in her entire arm to fingers. States she is losing strength in her arm.

## 2017-10-04 ENCOUNTER — Telehealth: Payer: Self-pay | Admitting: Family Medicine

## 2017-10-04 NOTE — Procedures (Signed)
EMG & NCV Findings: Evaluation of the left median motor nerve showed prolonged distal onset latency (4.8 ms) and decreased conduction velocity (Elbow-Wrist, 45 m/s).  The left ulnar motor nerve showed decreased conduction velocity (B Elbow-Wrist, 51 m/s).  The left median (across palm) sensory nerve showed prolonged distal peak latency (Wrist, 4.8 ms) and reduced amplitude (7.1 V).  The left ulnar sensory nerve showed prolonged distal peak latency (3.8 ms) and decreased conduction velocity (Wrist-5th Digit, 37 m/s).  All remaining nerves (as indicated in the following tables) were within normal limits.    All examined muscles (as indicated in the following table) showed no evidence of electrical instability.    Impression: The above electrodiagnostic study is ABNORMAL and reveals evidence of a moderate to severe left median nerve entrapment at the wrist (carpal tunnel syndrome) affecting sensory and motor components.   There is no significant electrodiagnostic evidence of any other focal nerve entrapment, brachial plexopathy or cervical radiculopathy.   As you know, this particular electrodiagnostic study cannot rule out chemical radiculitis or sensory only radiculopathy.  This electrodiagnostic study cannot rule out small fiber polyneuropathy and dysesthesias from central pain sensitization syndromes such as fibromyalgia.   Recommendations: 1.  Follow-up with referring physician. 2.  Continue current management of symptoms. 3.  Suggest surgical evaluation.  Consider diagnostic carpal tunnel injection to see if it alleviates any of the shoulder pain.   Nerve Conduction Studies Anti Sensory Summary Table   Stim Site NR Peak (ms) Norm Peak (ms) P-T Amp (V) Norm P-T Amp Site1 Site2 Delta-P (ms) Dist (cm) Vel (m/s) Norm Vel (m/s)  Left Median Acr Palm Anti Sensory (2nd Digit)  32.2C  Wrist    *4.8 <3.6 *7.1 >10 Wrist Palm 2.9 0.0    Palm    1.9 <2.0 6.2         Left Radial Anti Sensory  (Base 1st Digit)  32.4C  Wrist    2.1 <3.1 25.7  Wrist Base 1st Digit 2.1 0.0    Left Ulnar Anti Sensory (5th Digit)  32.6C  Wrist    *3.8 <3.7 15.0 >15.0 Wrist 5th Digit 3.8 14.0 *37 >38   Motor Summary Table   Stim Site NR Onset (ms) Norm Onset (ms) O-P Amp (mV) Norm O-P Amp Site1 Site2 Delta-0 (ms) Dist (cm) Vel (m/s) Norm Vel (m/s)  Left Median Motor (Abd Poll Brev)  32.6C  Wrist    *4.8 <4.2 6.4 >5 Elbow Wrist 4.4 19.8 *45 >50  Elbow    9.2  6.3         Left Ulnar Motor (Abd Dig Min)  32.6C  Wrist    3.1 <4.2 7.9 >3 B Elbow Wrist 3.6 18.5 *51 >53  B Elbow    6.7  5.8  A Elbow B Elbow 1.4 9.5 68 >53  A Elbow    8.1  7.5          EMG   Side Muscle Nerve Root Ins Act Fibs Psw Amp Dur Poly Recrt Int Fraser Din Comment  Left 1stDorInt Ulnar C8-T1 Nml Nml Nml Nml Nml 0 Nml Nml   Left Abd Poll Brev Median C8-T1 Nml Nml Nml Nml Nml 0 Nml Nml   Left ExtDigCom   Nml Nml Nml Nml Nml 0 Nml Nml   Left Triceps Radial C6-7-8 Nml Nml Nml Nml Nml 0 Nml Nml   Left Deltoid Axillary C5-6 Nml Nml Nml Nml Nml 0 Nml Nml     Nerve Conduction Studies Anti Sensory  Left/Right Comparison   Stim Site L Lat (ms) R Lat (ms) L-R Lat (ms) L Amp (V) R Amp (V) L-R Amp (%) Site1 Site2 L Vel (m/s) R Vel (m/s) L-R Vel (m/s)  Median Acr Palm Anti Sensory (2nd Digit)  32.2C  Wrist *4.8   *7.1   Wrist Palm     Palm 1.9   6.2         Radial Anti Sensory (Base 1st Digit)  32.4C  Wrist 2.1   25.7   Wrist Base 1st Digit     Ulnar Anti Sensory (5th Digit)  32.6C  Wrist *3.8   15.0   Wrist 5th Digit *37     Motor Left/Right Comparison   Stim Site L Lat (ms) R Lat (ms) L-R Lat (ms) L Amp (mV) R Amp (mV) L-R Amp (%) Site1 Site2 L Vel (m/s) R Vel (m/s) L-R Vel (m/s)  Median Motor (Abd Poll Brev)  32.6C  Wrist *4.8   6.4   Elbow Wrist *45    Elbow 9.2   6.3         Ulnar Motor (Abd Dig Min)  32.6C  Wrist 3.1   7.9   B Elbow Wrist *51    B Elbow 6.7   5.8   A Elbow B Elbow 68    A Elbow 8.1   7.5             Waveforms:

## 2017-10-04 NOTE — Telephone Encounter (Signed)
Tell her that it is perfectly fine to take 2 Zpacks back to back.. Also we rarely see any C diff infections from this type of antibiotic

## 2017-10-04 NOTE — Progress Notes (Signed)
Miranda [name] - 41 y.o. female MRN [id number]  Date of birth: 1976/10/26  Office Visit Note: Visit Date: 10/03/2017 PCP: Laurey Morale, MD Referred by: Laurey Morale, MD  Subjective: Chief Complaint  Patient presents with  . Left Shoulder - Pain  . Left Hand - Pain, Numbness, Weakness   HPI: Miranda [name] is a 41 year old right-hand dominant female who comes in today at the request of Dr. Erlinda Hong for electrodiagnostic studies of the left upper extremity.  She is having chronic worsening left shoulder pain and numbness and to hand digits.  This is somewhat nondermatomal and can affect the whole hand.  She says she has a history of carpal tunnel syndrome but no electrodiagnostic studies.  She reports a feeling of weakness.  She does get symptoms with activity and position change and also at night.  Did not had carpal tunnel surgery.  Right-sided symptoms.  She does work for [employer] consultant.    ROS Otherwise per HPI.  Assessment & Plan: Visit Diagnoses:  1. Paresthesia of skin     Plan: No additional findings.  Impression: The above electrodiagnostic study is ABNORMAL and reveals evidence of a moderate to severe left median nerve entrapment at the wrist (carpal tunnel syndrome) affecting sensory and motor components.   There is no significant electrodiagnostic evidence of any other focal nerve entrapment, brachial plexopathy or cervical radiculopathy.   As you know, this particular electrodiagnostic study cannot rule out chemical radiculitis or sensory only radiculopathy.  This electrodiagnostic study cannot rule out small fiber polyneuropathy and dysesthesias from central pain sensitization syndromes such as fibromyalgia.   Recommendations: 1.  Follow-up with referring physician. 2.  Continue current management of symptoms. 3.  Suggest surgical evaluation.  Consider diagnostic carpal tunnel injection to see if it alleviates any of the shoulder pain.   Meds & Orders: No orders of  the defined types were placed in this encounter.   Orders Placed This Encounter  Procedures  . NCV with EMG (electromyography)    Follow-up: Return for Dr. Erlinda Hong.   Procedures: No procedures performed  EMG & NCV Findings: Evaluation of the left median motor nerve showed prolonged distal onset latency (4.8 ms) and decreased conduction velocity (Elbow-Wrist, 45 m/s).  The left ulnar motor nerve showed decreased conduction velocity (B Elbow-Wrist, 51 m/s).  The left median (across palm) sensory nerve showed prolonged distal peak latency (Wrist, 4.8 ms) and reduced amplitude (7.1 V).  The left ulnar sensory nerve showed prolonged distal peak latency (3.8 ms) and decreased conduction velocity (Wrist-5th Digit, 37 m/s).  All remaining nerves (as indicated in the following tables) were within normal limits.    All examined muscles (as indicated in the following table) showed no evidence of electrical instability.    Impression: The above electrodiagnostic study is ABNORMAL and reveals evidence of a moderate to severe left median nerve entrapment at the wrist (carpal tunnel syndrome) affecting sensory and motor components.   There is no significant electrodiagnostic evidence of any other focal nerve entrapment, brachial plexopathy or cervical radiculopathy.   As you know, this particular electrodiagnostic study cannot rule out chemical radiculitis or sensory only radiculopathy.  This electrodiagnostic study cannot rule out small fiber polyneuropathy and dysesthesias from central pain sensitization syndromes such as fibromyalgia.   Recommendations: 1.  Follow-up with referring physician. 2.  Continue current management of symptoms. 3.  Suggest surgical evaluation.  Consider diagnostic carpal tunnel injection to see if it alleviates any of the shoulder pain.  Nerve Conduction Studies Anti Sensory Summary Table   Stim Site NR Peak (ms) Norm Peak (ms) P-T Amp (V) Norm P-T Amp Site1 Site2  Delta-P (ms) Dist (cm) Vel (m/s) Norm Vel (m/s)  Left Median Acr Palm Anti Sensory (2nd Digit)  32.2C  Wrist    *4.8 <3.6 *7.1 >10 Wrist Palm 2.9 0.0    Palm    1.9 <2.0 6.2         Left Radial Anti Sensory (Base 1st Digit)  32.4C  Wrist    2.1 <3.1 25.7  Wrist Base 1st Digit 2.1 0.0    Left Ulnar Anti Sensory (5th Digit)  32.6C  Wrist    *3.8 <3.7 15.0 >15.0 Wrist 5th Digit 3.8 14.0 *37 >38   Motor Summary Table   Stim Site NR Onset (ms) Norm Onset (ms) O-P Amp (mV) Norm O-P Amp Site1 Site2 Delta-0 (ms) Dist (cm) Vel (m/s) Norm Vel (m/s)  Left Median Motor (Abd Poll Brev)  32.6C  Wrist    *4.8 <4.2 6.4 >5 Elbow Wrist 4.4 19.8 *45 >50  Elbow    9.2  6.3         Left Ulnar Motor (Abd Dig Min)  32.6C  Wrist    3.1 <4.2 7.9 >3 B Elbow Wrist 3.6 18.5 *51 >53  B Elbow    6.7  5.8  A Elbow B Elbow 1.4 9.5 68 >53  A Elbow    8.1  7.5          EMG   Side Muscle Nerve Root Ins Act Fibs Psw Amp Dur Poly Recrt Int Fraser Din Comment  Left 1stDorInt Ulnar C8-T1 Nml Nml Nml Nml Nml 0 Nml Nml   Left Abd Poll Brev Median C8-T1 Nml Nml Nml Nml Nml 0 Nml Nml   Left ExtDigCom   Nml Nml Nml Nml Nml 0 Nml Nml   Left Triceps Radial C6-7-8 Nml Nml Nml Nml Nml 0 Nml Nml   Left Deltoid Axillary C5-6 Nml Nml Nml Nml Nml 0 Nml Nml     Nerve Conduction Studies Anti Sensory Left/Right Comparison   Stim Site L Lat (ms) R Lat (ms) L-R Lat (ms) L Amp (V) R Amp (V) L-R Amp (%) Site1 Site2 L Vel (m/s) R Vel (m/s) L-R Vel (m/s)  Median Acr Palm Anti Sensory (2nd Digit)  32.2C  Wrist *4.8   *7.1   Wrist Palm     Palm 1.9   6.2         Radial Anti Sensory (Base 1st Digit)  32.4C  Wrist 2.1   25.7   Wrist Base 1st Digit     Ulnar Anti Sensory (5th Digit)  32.6C  Wrist *3.8   15.0   Wrist 5th Digit *37     Motor Left/Right Comparison   Stim Site L Lat (ms) R Lat (ms) L-R Lat (ms) L Amp (mV) R Amp (mV) L-R Amp (%) Site1 Site2 L Vel (m/s) R Vel (m/s) L-R Vel (m/s)  Median Motor (Abd Poll Brev)  32.6C    Wrist *4.8   6.4   Elbow Wrist *45    Elbow 9.2   6.3         Ulnar Motor (Abd Dig Min)  32.6C  Wrist 3.1   7.9   B Elbow Wrist *51    B Elbow 6.7   5.8   A Elbow B Elbow 68    A Elbow 8.1   7.5  Waveforms:            Clinical History: MRI CERVICAL SPINE WITHOUT CONTRAST 03/08/2008   Technique:  Multiplanar and multiecho pulse sequences of the cervical spine, to include the craniocervical junction and cervicothoracic junction, were obtained according to standard protocol without intravenous contrast.   Comparison: None   Findings: Alignment is normal.  There is no disc pathology.  No osseous or articular pathology.  The canal and foramina are widely patent.  Ample subarachnoid space surrounds the cervical spinal cord.  No cord lesion is evident.  Posterior fossa structures appear unremarkable.   IMPRESSION: Normal examination.  No cause of the patient's described symptoms is identified.  She reports that she has quit smoking. Her smoking use included cigarettes. She smoked 0.00 packs per day. she has never used smokeless tobacco.  Recent Labs    10/26/16 0620 02/02/17  HGBA1C 7.8* 7.1    Objective:  VS:  HT:    WT:   BMI:     BP:   HR: bpm  TEMP: ( )  RESP:  Physical Exam  Musculoskeletal:  Inspection reveals no atrophy of the bilateral APB or FDI or hand intrinsics. There is no swelling, color changes, allodynia or dystrophic changes. There is 5 out of 5 strength in the bilateral wrist extension, finger abduction and long finger flexion.  There is subjective decreased sensation to light touch in a somewhat median nerve distribution. There is a negative Hoffmann's test bilaterally.    Ortho Exam Imaging: No results found.  Past Medical/Family/Surgical/Social History: Medications & Allergies reviewed per EMR Patient Active Problem List   Diagnosis Date Noted  . Type 2 diabetes mellitus without complications ([place]) 09/98/3382  . PTSD  (post-traumatic stress disorder) 10/25/2016  . 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[place]) 10/25/2016  . Diabetes mellitus ([place]) 10/25/2016  . Major depressive disorder, recurrent episode, severe, with psychosis ([place]) 10/20/2016  . Major depressive disorder, recurrent episode, severe, 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[place]) 10/19/2016  . TIA (transient ischemic attack) 05/16/2016  . Arterial ischemic stroke, MCA, left, acute ([place]) 03/29/2016  . Plantar fasciitis 08/08/2013  . Obesity, Class III, BMI 40-49.9 (morbid obesity) ([place]) 04/09/2013  . Unspecified sleep apnea 03/31/2013  . PCO (polycystic ovaries) 04/11/2012  . NECK PAIN 11/21/2010  . FATTY LIVER DISEASE 01/26/2010  . NAUSEA WITH VOMITING 01/26/2010  . ABDOMINAL PAIN, EPIGASTRIC 01/26/2010  . PORTAL HYPERTENSION 01/26/2010  . ABDOMINAL PAIN, RIGHT UPPER QUADRANT 12/27/2009  . Migraine headache 06/28/2009  . ACUTE BRONCHITIS 06/08/2009  . ABDOMINAL PAIN, RIGHT LOWER QUADRANT 11/24/2008  . BREAST MASS 08/21/2008  . KNEE PAIN 04/14/2008  . Hypothyroidism 03/17/2008  . Hyperlipidemia 03/17/2008  . DEPRESSION 03/17/2008  . NEUROPATHY 03/17/2008  . Essential hypertension 03/17/2008  . ASTHMA 03/17/2008  . GERD 03/17/2008  . Headache 03/17/2008   Past Medical History:  Diagnosis Date  . Anxiety   . Arthritis   . Asthma   . Bowel obstruction ([place])   . Cardiac arrhythmia   . Depression    sees Dr. Matilde Haymaker in [place]   . Diabetes mellitus    type 2, on insulin pump, sees Dr. Loanne Drilling   . GERD (gastroesophageal reflux disease)   . Hyperlipidemia   . Hypertension   . Hypothyroidism    sees Dr. Elyse Hsu  . Insomnia   . Migraine syndrome   . Morbid obesity ([place])   . Multiple personality ([place])   . Neck pain   . Neuropathy associated with endocrine disorder ([place])   .  Sleep apnea with use of continuous positive airway pressure (CPAP)    Sleep apnea is resolved due to weight loss. 08/2017  . Stroke [facility]) 03-25-16   left MCA   .  Stroke [facility]) 04/2016   Family History  Adopted: Yes  Problem Relation Age of Onset  . Heart disease Unknown        on both sides of family  . Diabetes Unknown        on both sides of family   Past Surgical History:  Procedure Laterality Date  . blocked itestinal repair  48   age 8 months  . BREAST BIOPSY Left 2017  . CHOLECYSTECTOMY  2011  . INDUCED ABORTION  1996   forced abortion  . INGUINAL HERNIA REPAIR Left 1980   age 1  . LAPAROSCOPIC ENDOMETRIOSIS FULGURATION  1998  . WISDOM TOOTH EXTRACTION  2007   Social History   Occupational History  . Occupation: Nurse, learning disability  Tobacco Use  . Smoking status: Former Smoker    Packs/day: 0.00    Types: Cigarettes  . Smokeless tobacco: Never Used  . Tobacco comment: quit 03/25/2016  Substance and Sexual Activity  . Alcohol use: No    Alcohol/week: 0.0 oz  . Drug use: No  . Sexual activity: Yes

## 2017-10-04 NOTE — Telephone Encounter (Signed)
Sent to PCP for advice.

## 2017-10-04 NOTE — Telephone Encounter (Signed)
Phone call from pt.  Reported she took a Zpak prescribed by Dr. Sarajane Jews for URI, and finished it on 09/30/17.  Stated she went to the dentist today, and had to have a tooth extracted, due to "cellulitis and bone loss."  Was advised by her dentist  to take a Zpak for the infection.  The pt. stated she told the dentist she had just finished a Zpak.  Stated she felt very rushed through the appt.  Voiced concern about taking another Zpak so close to having just finished one.  Is requesting that Dr. Sarajane Jews advise her if this is okay to do.  Stated she is concerned about C-Diff.  Advised will send message to Dr. Sarajane Jews with pt's question.

## 2017-10-05 NOTE — Telephone Encounter (Signed)
Pt advised and voiced understanding.   

## 2017-10-08 ENCOUNTER — Ambulatory Visit (INDEPENDENT_AMBULATORY_CARE_PROVIDER_SITE_OTHER): Payer: Self-pay | Admitting: Orthopaedic Surgery

## 2017-10-08 ENCOUNTER — Ambulatory Visit: Payer: Self-pay | Admitting: Psychology

## 2017-10-11 ENCOUNTER — Ambulatory Visit (INDEPENDENT_AMBULATORY_CARE_PROVIDER_SITE_OTHER): Payer: Self-pay | Admitting: Psychology

## 2017-10-11 DIAGNOSIS — F4312 Post-traumatic stress disorder, chronic: Secondary | ICD-10-CM

## 2017-10-11 DIAGNOSIS — F4481 Dissociative identity disorder: Secondary | ICD-10-CM

## 2017-10-12 ENCOUNTER — Ambulatory Visit (INDEPENDENT_AMBULATORY_CARE_PROVIDER_SITE_OTHER): Payer: Self-pay

## 2017-10-12 ENCOUNTER — Ambulatory Visit (INDEPENDENT_AMBULATORY_CARE_PROVIDER_SITE_OTHER): Payer: Self-pay | Admitting: Orthopaedic Surgery

## 2017-10-12 ENCOUNTER — Encounter (INDEPENDENT_AMBULATORY_CARE_PROVIDER_SITE_OTHER): Payer: Self-pay | Admitting: Orthopaedic Surgery

## 2017-10-12 DIAGNOSIS — M170 Bilateral primary osteoarthritis of knee: Secondary | ICD-10-CM

## 2017-10-12 DIAGNOSIS — G5602 Carpal tunnel syndrome, left upper limb: Secondary | ICD-10-CM

## 2017-10-12 MED ORDER — METHYLPREDNISOLONE ACETATE 40 MG/ML IJ SUSP
40.0000 mg | INTRAMUSCULAR | Status: AC | PRN
  Administered 2017-10-12: 40 mg via INTRA_ARTICULAR

## 2017-10-12 MED ORDER — BUPIVACAINE HCL 0.5 % IJ SOLN
2.0000 mL | INTRAMUSCULAR | Status: AC | PRN
  Administered 2017-10-12: 2 mL via INTRA_ARTICULAR

## 2017-10-12 MED ORDER — LIDOCAINE HCL 1 % IJ SOLN
2.0000 mL | INTRAMUSCULAR | Status: AC | PRN
  Administered 2017-10-12: 2 mL

## 2017-10-12 NOTE — Progress Notes (Addendum)
Office Visit Note   Patient: Miranda [name]           Date of Birth: Mar 05, 1976           MRN: [id number] Visit Date: 10/12/2017              Requested by: Laurey Morale, MD [address], [place] [address] PCP: Laurey Morale, MD   Assessment & Plan: Visit Diagnoses:  1. Bilateral primary osteoarthritis of knee   2. Carpal tunnel syndrome, left upper limb     Plan: Patient has mainly medial compartment arthritis with varus alignment.  Her pain is mainly medial.  We did perform a right knee injection today.  In terms of her carpal tunnel syndrome this is moderate to severe and we did discuss surgical release and the associated risk benefits alternatives.  She understands and wished to proceed.  She would like to wait a couple months so that she can have health insurance first.  Questions encouraged and answered.  Follow-up in 2 months.  Follow-Up Instructions: Return in about 2 months (around 12/13/2017).   Orders:  Orders Placed This Encounter  Procedures  . Large Joint Inj: R knee  . XR KNEE 3 VIEW LEFT  . XR KNEE 3 VIEW RIGHT   Meds ordered this encounter  Medications  . bupivacaine (MARCAINE) 0.5 % (with pres) injection 2 mL  . lidocaine (XYLOCAINE) 1 % (with pres) injection 2 mL  . methylPREDNISolone acetate (DEPO-MEDROL) injection 40 mg      Procedures: Large Joint Inj: R knee on 10/12/2017 9:52 AM Indications: pain Details: 22 G needle  Arthrogram: No  Medications: 40 mg methylPREDNISolone acetate 40 MG/ML; 2 mL lidocaine 1 %; 2 mL bupivacaine 0.5 % Consent was given by the patient. Patient was prepped and draped in the usual sterile fashion.       Clinical Data: No additional findings.   Subjective: Chief Complaint  Patient presents with  . Right Knee - Pain  . Left Knee - Pain    Patient follows up today for bilateral knee pain and nerve conduction studies for her left carpal tunnel syndrome.  She had a previous injection with  her right knee about 10 years ago which did give her good relief.  She is now complaining of mainly medial sided knee pain bilaterally worse on the right.  Her nerve conduction studies showed moderate to severe left carpal tunnel syndrome.  She is also complaining of some neck pain.    Review of Systems  Constitutional: Negative.   HENT: Negative.   Eyes: Negative.   Respiratory: Negative.   Cardiovascular: Negative.   Endocrine: Negative.   Musculoskeletal: Negative.   Neurological: Negative.   Hematological: Negative.   Psychiatric/Behavioral: Negative.   All other systems reviewed and are negative.    Objective: Vital Signs: LMP 09/22/2017   Physical Exam  Constitutional: She is oriented to person, place, and time. She appears well-developed and well-nourished.  HENT:  Head: Normocephalic and atraumatic.  Eyes: EOM are normal.  Neck: Neck supple.  Pulmonary/Chest: Effort normal.  Abdominal: Soft.  Neurological: She is alert and oriented to person, place, and time.  Skin: Skin is warm. Capillary refill takes less than 2 seconds.  Psychiatric: She has a normal mood and affect. Her behavior is normal. Judgment and thought content normal.  Nursing note and vitals reviewed.   Ortho Exam Right knee exam shows no joint effusion.  Collaterals and cruciates are stable.  Left hand exam is stable. Specialty Comments:  No specialty comments available.  Imaging: No results found.   PMFS History: Patient Active Problem List   Diagnosis Date Noted  . Type 2 diabetes mellitus without complications ([place]) 20/94/7096  . PTSD (post-traumatic stress disorder) 10/25/2016  . 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 Diabetes mellitus ([place]) 10/25/2016  . Major depressive disorder, recurrent episode, severe, with psychosis ([place]) 10/20/2016  . Major depressive disorder, recurrent episode, severe, 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[place]) 10/19/2016  . TIA (transient ischemic attack)  05/16/2016  . Arterial ischemic stroke, MCA, left, acute ([place]) 03/29/2016  . Plantar fasciitis 08/08/2013  . Obesity, Class III, BMI 40-49.9 (morbid obesity) ([place]) 04/09/2013  . Unspecified sleep apnea 03/31/2013  . PCO (polycystic ovaries) 04/11/2012  . NECK PAIN 11/21/2010  . FATTY LIVER DISEASE 01/26/2010  . NAUSEA WITH VOMITING 01/26/2010  . ABDOMINAL PAIN, EPIGASTRIC 01/26/2010  . PORTAL HYPERTENSION 01/26/2010  . ABDOMINAL PAIN, RIGHT UPPER QUADRANT 12/27/2009  . Migraine headache 06/28/2009  . ACUTE BRONCHITIS 06/08/2009  . ABDOMINAL PAIN, RIGHT LOWER QUADRANT 11/24/2008  . BREAST MASS 08/21/2008  . KNEE PAIN 04/14/2008  . Hypothyroidism 03/17/2008  . Hyperlipidemia 03/17/2008  . DEPRESSION 03/17/2008  . NEUROPATHY 03/17/2008  . Essential hypertension 03/17/2008  . ASTHMA 03/17/2008  . GERD 03/17/2008  . Headache 03/17/2008   Past Medical History:  Diagnosis Date  . Anxiety   . Arthritis   . Asthma   . Bowel obstruction ([place])   . Cardiac arrhythmia   . Depression    sees Dr. Matilde Haymaker in [place]   . Diabetes mellitus    type 2, on insulin pump, sees Dr. Loanne Drilling   . GERD (gastroesophageal reflux disease)   . Hyperlipidemia   . Hypertension   . Hypothyroidism    sees Dr. Elyse Hsu  . Insomnia   . Migraine syndrome   . Morbid obesity ([place])   . 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 Neck pain   . Neuropathy associated with endocrine disorder ([place])   . Sleep apnea with use of continuous positive airway pressure (CPAP)    Sleep apnea is resolved due to weight loss. 08/2017  . Stroke [facility]) 03-25-16   left MCA   . Stroke [facility]) 04/2016    Family History  Adopted: Yes  Problem Relation Age of Onset  . Heart disease Unknown        on both sides of family  . Diabetes Unknown        on both sides of family    Past Surgical History:  Procedure Laterality Date  . blocked itestinal repair  89   age 67 months  . BREAST BIOPSY Left 2017  . CHOLECYSTECTOMY  2011    . INDUCED ABORTION  1996   forced abortion  . INGUINAL HERNIA REPAIR Left 1980   age 33  . LAPAROSCOPIC ENDOMETRIOSIS FULGURATION  1998  . WISDOM TOOTH EXTRACTION  2007   Social History   Occupational History  . Occupation: Nurse, learning disability  Tobacco Use  . Smoking status: Former Smoker    Packs/day: 0.00    Types: Cigarettes  . Smokeless tobacco: Never Used  . Tobacco comment: quit 03/25/2016  Substance and Sexual Activity  . Alcohol use: No    Alcohol/week: 0.0 oz  . Drug use: No  . Sexual activity: Yes

## 2017-10-15 ENCOUNTER — Ambulatory Visit (INDEPENDENT_AMBULATORY_CARE_PROVIDER_SITE_OTHER): Payer: Self-pay | Admitting: Psychology

## 2017-10-15 DIAGNOSIS — F4312 Post-traumatic stress disorder, chronic: Secondary | ICD-10-CM

## 2017-10-15 DIAGNOSIS — F4481 Dissociative identity disorder: Secondary | ICD-10-CM

## 2017-10-18 ENCOUNTER — Ambulatory Visit (INDEPENDENT_AMBULATORY_CARE_PROVIDER_SITE_OTHER): Payer: Self-pay | Admitting: Psychology

## 2017-10-18 DIAGNOSIS — F4481 Dissociative identity disorder: Secondary | ICD-10-CM

## 2017-10-18 DIAGNOSIS — F4312 Post-traumatic stress disorder, chronic: Secondary | ICD-10-CM

## 2017-10-22 ENCOUNTER — Ambulatory Visit: Payer: Self-pay | Admitting: Psychology

## 2017-10-25 ENCOUNTER — Emergency Department (HOSPITAL_BASED_OUTPATIENT_CLINIC_OR_DEPARTMENT_OTHER)
Admission: EM | Admit: 2017-10-25 | Discharge: 2017-10-25 | Disposition: A | Discharge status: Home / Self Care | Payer: Self-pay | Attending: Physician Assistant | Admitting: Physician Assistant

## 2017-10-25 ENCOUNTER — Other Ambulatory Visit: Payer: Self-pay

## 2017-10-25 ENCOUNTER — Ambulatory Visit: Payer: Self-pay | Admitting: Psychology

## 2017-10-25 ENCOUNTER — Encounter (HOSPITAL_BASED_OUTPATIENT_CLINIC_OR_DEPARTMENT_OTHER): Payer: Self-pay | Admitting: Emergency Medicine

## 2017-10-25 ENCOUNTER — Telehealth (INDEPENDENT_AMBULATORY_CARE_PROVIDER_SITE_OTHER): Payer: Self-pay | Admitting: Orthopaedic Surgery

## 2017-10-25 DIAGNOSIS — Z794 Long term (current) use of insulin: Secondary | ICD-10-CM | POA: Insufficient documentation

## 2017-10-25 DIAGNOSIS — E039 Hypothyroidism, unspecified: Secondary | ICD-10-CM | POA: Insufficient documentation

## 2017-10-25 DIAGNOSIS — J45909 Unspecified asthma, uncomplicated: Secondary | ICD-10-CM | POA: Insufficient documentation

## 2017-10-25 DIAGNOSIS — Z8673 Personal history of transient ischemic attack (TIA), and cerebral infarction without residual deficits: Secondary | ICD-10-CM | POA: Insufficient documentation

## 2017-10-25 DIAGNOSIS — I10 Essential (primary) hypertension: Secondary | ICD-10-CM | POA: Insufficient documentation

## 2017-10-25 DIAGNOSIS — E1140 Type 2 diabetes mellitus with diabetic neuropathy, unspecified: Secondary | ICD-10-CM | POA: Insufficient documentation

## 2017-10-25 DIAGNOSIS — R51 Headache: Secondary | ICD-10-CM | POA: Insufficient documentation

## 2017-10-25 DIAGNOSIS — Z87891 Personal history of nicotine dependence: Secondary | ICD-10-CM | POA: Insufficient documentation

## 2017-10-25 DIAGNOSIS — Z7902 Long term (current) use of antithrombotics/antiplatelets: Secondary | ICD-10-CM | POA: Insufficient documentation

## 2017-10-25 DIAGNOSIS — R519 Headache, unspecified: Secondary | ICD-10-CM

## 2017-10-25 DIAGNOSIS — M5412 Radiculopathy, cervical region: Secondary | ICD-10-CM | POA: Insufficient documentation

## 2017-10-25 MED ORDER — DIPHENHYDRAMINE HCL 25 MG PO CAPS
25.0000 mg | ORAL_CAPSULE | Freq: Once | ORAL | Status: AC
  Administered 2017-10-25: 25 mg via ORAL
  Filled 2017-10-25: qty 1

## 2017-10-25 MED ORDER — METHYLPREDNISOLONE 4 MG PO TBPK: ORAL_TABLET | ORAL | 0 refills | Status: DC

## 2017-10-25 MED ORDER — KETOROLAC TROMETHAMINE 30 MG/ML IJ SOLN
30.0000 mg | Freq: Once | INTRAMUSCULAR | Status: AC
  Administered 2017-10-25: 30 mg via INTRAVENOUS
  Filled 2017-10-25: qty 1

## 2017-10-25 MED ORDER — METOCLOPRAMIDE HCL 5 MG/ML IJ SOLN
10.0000 mg | Freq: Once | INTRAMUSCULAR | Status: AC
  Administered 2017-10-25: 10 mg via INTRAVENOUS
  Filled 2017-10-25: qty 2

## 2017-10-25 NOTE — ED Notes (Signed)
ED Provider at bedside. 

## 2017-10-25 NOTE — Discharge Instructions (Signed)
Please read and follow all provided instructions.  Your diagnoses today include:  1. Bad headache   2. Cervical radiculopathy    Your headache was treated in the ED today.  I am starting you on steroids for your cervical radiculopathy.  I have attached a handout in regards to your diagnosis.  Please follow-up with your orthopedist for further evaluation and treatment of this.  Note that steroids can increase her blood sugar.  Please monitor these closely.   Follow-up instructions: Please follow-up with your primary care provider in the next 3 days for further evaluation of your symptoms.   Return instructions:  Please return to the Emergency Department if you experience worsening symptoms. Return if the medications do not resolve your headache, if it recurs, or if you have multiple episodes of vomiting or cannot keep down fluids. Return if you have a change from the usual headache. RETURN IMMEDIATELY IF you: Develop a sudden, severe headache Develop confusion or become poorly responsive or faint Develop a fever above 100.42F or problem breathing Have a change in speech, vision, swallowing, or understanding Develop new weakness, numbness, tingling, incoordination in your arms or legs Have a seizure Please return if you have any other emergent concerns.  Additional Information:  Your vital signs today were: BP 122/73 (BP Location: Left Arm)    Pulse (!) 58    Temp 98.2 F (36.8 C) (Oral)    Resp 16    Ht 5\' 1"  (1.549 m)    Wt 86.2 kg (190 lb)    LMP 10/22/2017    SpO2 98%    BMI 35.90 kg/m  If your blood pressure (BP) was elevated above 135/85 this visit, please have this repeated by your doctor within one month. --------------

## 2017-10-25 NOTE — ED Triage Notes (Signed)
Pt reports HA since yesterday. Taking Maxalt without relief. Also being treated for a nerve impingement in her neck.

## 2017-10-25 NOTE — Telephone Encounter (Signed)
Just FYI. Patient has been seen for her left arm/shoulder pain. All of a sudden she states that her right arm/shoulder have been hurting and down in to her fingers especially once its stretched out. She has also had a 24 hour migraine and she believes this is all related to a neck problem? She has physical therapy tomorrow and was going to consult them about this. I went ahead and scheduled her for the next available 11/01/2017.

## 2017-10-25 NOTE — ED Provider Notes (Signed)
[facility] EMERGENCY DEPARTMENT Provider Note   CSN: [id number] Arrival date & time: 10/25/17  1700     History   Chief Complaint Chief Complaint  Patient presents with  . Headache    HPI Miranda [name] is a 41 y.o. female  With past medical history significant for migraines who presents to the emergency department today for headache.  Patient states this feels like her typical migraine, located on the right side, throbbing in nature and associated with photophobia and phonophobia.  She typically takes Maxalt for her headaches but this did not relieve her pain. She states when this typically occurs she needs Ketoralac and to rest in a quiet, dark room to relieve her symptoms which is why she presented  She states the headache started gradually and has been the same since onset.  No thunderclap onset.  Not first headache or worst headache of life.  States this is similar to previous migraines in the past.  She denies any fever, syncope, head trauma, nausea/vomiting, visual changes, diplopia, aphasia or difficulty with speech, extremity numbness/tingling/weakness.  Patient also notes that that she is currently being treated for a pinched nerve in her neck.  She states that over the last 1 week she has been having increasing pain from her neck down the lateral aspect of her shoulder into the medial aspect of her elbow into the fourth and fifth fingers of her right hand.  Is not currently on any steroids.  She has not taken anything for this.   HPI  Past Medical History:  Diagnosis Date  . Anxiety   . Arthritis   . Asthma   . Bowel obstruction ([place])   . Cardiac arrhythmia   . Depression    sees Dr. Matilde Haymaker in [place]   . Diabetes mellitus    type 2, on insulin pump, sees Dr. Loanne Drilling   . GERD (gastroesophageal reflux disease)   . Hyperlipidemia   . Hypertension   . Hypothyroidism    sees Dr. Elyse Hsu  . Insomnia   . Migraine syndrome   . Morbid obesity  ([place])   . 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 Neck pain   . Neuropathy associated with endocrine disorder ([place])   . Sleep apnea with use of continuous positive airway pressure (CPAP)    Sleep apnea is resolved due to weight loss. 08/2017  . Stroke [facility]) 03-25-16   left MCA   . Stroke [facility]) 04/2016    Patient Active Problem List   Diagnosis Date Noted  . Type 2 diabetes mellitus without complications ([place]) 09/38/1829  . PTSD (post-traumatic stress disorder) 10/25/2016  . 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 Diabetes mellitus ([place]) 10/25/2016  . Major depressive disorder, recurrent episode, severe, with psychosis ([place]) 10/20/2016  . Major depressive disorder, recurrent episode, severe, 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[place]) 10/19/2016  . TIA (transient ischemic attack) 05/16/2016  . Arterial ischemic stroke, MCA, left, acute ([place]) 03/29/2016  . Plantar fasciitis 08/08/2013  . Obesity, Class III, BMI 40-49.9 (morbid obesity) ([place]) 04/09/2013  . Unspecified sleep apnea 03/31/2013  . PCO (polycystic ovaries) 04/11/2012  . NECK PAIN 11/21/2010  . FATTY LIVER DISEASE 01/26/2010  . NAUSEA WITH VOMITING 01/26/2010  . ABDOMINAL PAIN, EPIGASTRIC 01/26/2010  . PORTAL HYPERTENSION 01/26/2010  . ABDOMINAL PAIN, RIGHT UPPER QUADRANT 12/27/2009  . Migraine headache 06/28/2009  . ACUTE BRONCHITIS 06/08/2009  . ABDOMINAL PAIN, RIGHT LOWER QUADRANT 11/24/2008  . BREAST MASS 08/21/2008  . KNEE PAIN 04/14/2008  .  Hypothyroidism 03/17/2008  . Hyperlipidemia 03/17/2008  . DEPRESSION 03/17/2008  . NEUROPATHY 03/17/2008  . Essential hypertension 03/17/2008  . ASTHMA 03/17/2008  . GERD 03/17/2008  . Headache 03/17/2008    Past Surgical History:  Procedure Laterality Date  . blocked itestinal repair  55   age 70 months  . BREAST BIOPSY Left 2017  . CHOLECYSTECTOMY  2011  . INDUCED ABORTION  1996   forced abortion  . INGUINAL HERNIA REPAIR Left 1980   age 77  . LAPAROSCOPIC ENDOMETRIOSIS  FULGURATION  1998  . [place] EXTRACTION  2007    OB History    No data available       Home Medications    Prior to Admission medications   Medication Sig Start Date End Date Taking? Authorizing Provider  albuterol (PROAIR HFA) 108 (90 Base) MCG/ACT inhaler Inhale 2 puffs into the lungs every 4 (four) hours as needed for wheezing. 07/24/17   Laurey Morale, MD  ARIPiprazole (ABILIFY) 15 MG tablet Take 15 mg by mouth daily.    [provider]  azithromycin (ZITHROMAX) 250 MG tablet As directed 09/26/17   Laurey Morale, MD  budesonide-formoterol [facility]) 160-4.5 MCG/ACT inhaler Inhale 2 puffs into the lungs 2 (two) times daily. 07/24/17   Laurey Morale, MD  buPROPion (WELLBUTRIN XL) 300 MG 24 hr tablet Take 1 tablet (300 mg total) by mouth daily. 10/28/16   Kerrie Buffalo, NP  clopidogrel (PLAVIX) 75 MG tablet Take 1 tablet (75 mg total) by mouth daily. 07/09/17   Laurey Morale, MD  diclofenac (VOLTAREN) 75 MG EC tablet Take 1 tablet (75 mg total) by mouth 2 (two) times daily. 08/24/17   Laurey Morale, MD  ferrous sulfate (FER-IN-SOL) 75 (15 Fe) MG/ML SOLN Take by mouth.    [provider]  HYDROcodone-homatropine (HYDROMET) 5-1.5 MG/5ML syrup Take 5 mLs by mouth every 4 (four) hours as needed. 09/26/17   Laurey Morale, MD  hydrOXYzine (ATARAX/VISTARIL) 50 MG tablet Take 50 mg by mouth. Take 1 tablet every night and may take 3 other times as needed    [provider]  ibuprofen (ADVIL,MOTRIN) 800 MG tablet Take 1 tablet (800 mg total) every 8 (eight) hours as needed by mouth. 09/15/17   Lawyer, Harrell Gave, PA-C  Insulin Human (INSULIN PUMP) SOLN Inject into the skin. novolog    [provider]  levothyroxine (SYNTHROID, LEVOTHROID) 125 MCG tablet Take 1 tablet (125 mcg total) by mouth daily before breakfast. 09/26/17   Laurey Morale, MD  metFORMIN (GLUCOPHAGE) 1000 MG tablet Take 1 tablet (1,000 mg total) by mouth 2 (two) times daily with a  meal. 10/27/16   Kerrie Buffalo, NP  metoprolol succinate (TOPROL-XL) 25 MG 24 hr tablet Take 0.5 tablets (12.5 mg total) by mouth daily. 10/28/16   Kerrie Buffalo, NP  Multiple Vitamin (MULTIVITAMIN) tablet Take 1 tablet by mouth daily.    [provider]  pioglitazone (ACTOS) 45 MG tablet Take 1 tablet (45 mg total) by mouth daily. 10/28/16   Kerrie Buffalo, NP  prazosin (MINIPRESS) 5 MG capsule Take 10 mg by mouth at bedtime.    [provider]  promethazine (PHENERGAN) 25 MG tablet Take 1 tablet (25 mg total) every 6 (six) hours as needed by mouth for nausea or vomiting. 09/15/17   Lawyer, Harrell Gave, PA-C  rizatriptan (MAXALT) 10 MG tablet Take 1 tablet (10 mg total) by mouth as needed for migraine. May repeat in 2 hours if needed 01/04/17  Laurey Morale, MD  rosuvastatin (CRESTOR) 20 MG tablet TAKE 1 TABLET(20 MG) BY MOUTH DAILY 08/24/17   Laurey Morale, MD  sertraline (ZOLOFT) 100 MG tablet Take 200 mg by mouth daily.    [provider]  topiramate (TOPAMAX) 100 MG tablet Take 1 tablet (100 mg total) by mouth 2 (two) times daily. 01/14/16   Laurey Morale, MD  traZODone (DESYREL) 100 MG tablet Take 1 tablet (100 mg total) by mouth at bedtime. Patient taking differently: Take 150 mg by mouth at bedtime.  10/27/16   Kerrie Buffalo, NP    Family History Family History  Adopted: Yes  Problem Relation Age of Onset  . Heart disease Unknown        on both sides of family  . Diabetes Unknown        on both sides of family    Social History Social History   Tobacco Use  . Smoking status: Former Smoker    Packs/day: 0.00    Types: Cigarettes  . Smokeless tobacco: Never Used  . Tobacco comment: quit 03/25/2016  Substance Use Topics  . Alcohol use: No    Alcohol/week: 0.0 oz  . Drug use: No     Allergies   Macrobid [nitrofurantoin]; [place] 10 mcg pen [exenatide]; Clindamycin/lincomycin; Geodon [ziprasidone hcl]; Lipitor [atorvastatin]; Penicillins;  Avocado; and Tramadol hcl   Review of Systems Review of Systems  All other systems reviewed and are negative.    Physical Exam Updated Vital Signs BP (!) 142/90 (BP Location: Left Arm)   Pulse (!) 59   Temp 98.2 F (36.8 C) (Oral)   Resp 18   Ht 5\' 1"  (1.549 m)   Wt 86.2 kg (190 lb)   LMP 10/22/2017   SpO2 100%   BMI 35.90 kg/m   Physical Exam  Constitutional: She appears well-developed and well-nourished.  HENT:  Head: Normocephalic and atraumatic.  Right Ear: External ear normal.  Left Ear: External ear normal.  Nose: Nose normal.  Mouth/Throat: Uvula is midline, oropharynx is clear and moist and mucous membranes are normal. No tonsillar exudate.  Eyes: Pupils are equal, round, and reactive to light. Right eye exhibits no discharge. Left eye exhibits no discharge. No scleral icterus.  Neck: Trachea normal. Neck supple. No spinous process tenderness present. No neck rigidity. Normal range of motion present.  Cardiovascular: Normal rate, regular rhythm and intact distal pulses.  No murmur heard. Pulses:      Radial pulses are 2+ on the right side, and 2+ on the left side.       Dorsalis pedis pulses are 2+ on the right side, and 2+ on the left side.       Posterior tibial pulses are 2+ on the right side, and 2+ on the left side.  No lower extremity swelling or edema. Calves symmetric in size bilaterally.  Pulmonary/Chest: Effort normal and breath sounds normal. She exhibits no tenderness.  Abdominal: Soft. Bowel sounds are normal. There is no tenderness. There is no rebound and no guarding.  Musculoskeletal: She exhibits no edema.  Cervical Spine: Appearance normal. No obvious bony deformity. No skin swelling, erythema, heat, fluctuance or break of the skin. No TTP over the cervical spinous processes. Right paraspinal tenderness. No step-offs. Patient is able to actively rotate their neck 45 degrees left and right voluntarily without pain and flex and extend the neck  without pain.  Positive Spurling's test on the right.   Right Shoulder: Appearance normal. No obvious bony  deformity. No skin swelling, erythema, heat, fluctuance or break of the skin. No clavicular deformity or TTP. No TTP. Active and passive flexion, extension, abduction, adduction, and internal/external rotation intact without pain or crepitus. Strength for flexion, extension, abduction, adduction, and internal/external rotation intact and appropriate for age Right Elbow: Appearance normal. No obvious bony deformity. No skin swelling, erythema, heat, fluctuance or break of the skin. No TTP over joint. Active flexion, extension, supination and pronation full and intact without pain. Strength able and appropriate for age for flexion and extension.  Radial Pulse 2+. Cap refill <2 seconds. SILT for M/U/R distributions. Compartments soft.   Lymphadenopathy:    She has no cervical adenopathy.  Neurological: She is alert.  Mental Status:  Alert, oriented, thought content appropriate, able to give a coherent history. Speech fluent without evidence of aphasia. Able to follow 2 step commands without difficulty.  Cranial Nerves:  II:  Peripheral visual fields grossly normal, pupils equal, round, reactive to light III,IV, VI: ptosis not present, extra-ocular motions intact bilaterally  V,VII: smile symmetric, eyebrows raise symmetric, facial light touch sensation equal VIII: hearing grossly normal to voice  X: uvula elevates symmetrically  XI: bilateral shoulder shrug symmetric and strong XII: midline tongue extension without fassiculations Motor:  Normal tone. 5/5 in upper and lower extremities bilaterally including strong and equal grip strength and dorsiflexion/plantar flexion Sensory: Sensation intact to light touch in all extremities. Negative Romberg.  Deep Tendon Reflexes: 2+ and symmetric in the biceps and patella Cerebellar: normal finger-to-nose with bilateral upper extremities. Normal heel-to  -shin balance bilaterally of the lower extremity. No pronator drift.  Gait: normal gait and balance CV: distal pulses palpable throughout   Skin: Skin is warm and dry. No rash noted. She is not diaphoretic.  Psychiatric: She has a normal mood and affect.  Nursing note and vitals reviewed.    ED Treatments / Results  Labs (all labs ordered are listed, but only abnormal results are displayed) Labs Reviewed - No data to display  EKG  EKG Interpretation None       Radiology No results found.  Procedures Procedures (including critical care time)  Medications Ordered in ED Medications  ketorolac (TORADOL) 30 MG/ML injection 30 mg (30 mg Intravenous Given 10/25/17 1908)  metoCLOPramide (REGLAN) injection 10 mg (10 mg Intravenous Given 10/25/17 1908)  diphenhydrAMINE (BENADRYL) capsule 25 mg (25 mg Oral Given 10/25/17 1905)     Initial Impression / Assessment and Plan / ED Course  I have reviewed the triage vital signs and the nursing notes.  Pertinent labs & imaging results that were available during my care of the patient were reviewed by me and considered in my medical decision making (see chart for details).     Pt HA treated and improved while in ED.  Presentation is like pts typical HA and non concerning for [facility], ICH, Meningitis, or temporal arteritis. Pt is afebrile with no focal neuro deficits, nuchal rigidity, or change in vision. Pt is to follow up with PCP vs neurology to discuss change in prophylactic medication. Pt verbalizes understanding and is agreeable with plan to dc.   Patient with 1 week of pain consistent with cervical radiculopathy.  Positive Spurling's test.  No focal neurologic deficits.  Patient is currently being followed by orthopedics.  Discussed risk versus benefits with treatment of steroids as the patient has diabetes.  She would like to go ahead with treatment of steroids at this current time and will closely follow her blood glucose  level.  Patient  is to follow-up with orthopedist, Dr. Erlinda Hong, that she is currently followed with. Patient in agreement with plan. Return precautions discussed. She is in agreement with plan and appears safe for discharge.   Final Clinical Impressions(s) / ED Diagnoses   Final diagnoses:  Bad headache  Cervical radiculopathy    ED Discharge Orders        Ordered    methylPREDNISolone (MEDROL DOSEPAK) 4 MG TBPK tablet     10/25/17 2052       Lorelle Gibbs 10/26/17 0116    Macarthur Critchley, MD 10/30/17 2337

## 2017-10-25 NOTE — ED Notes (Signed)
Pt reports her "h/a is much better"

## 2017-10-26 NOTE — Telephone Encounter (Signed)
See message.

## 2017-10-26 NOTE — Telephone Encounter (Signed)
Ok thanks 

## 2017-10-29 ENCOUNTER — Ambulatory Visit: Payer: Self-pay | Admitting: Psychology

## 2017-11-01 ENCOUNTER — Ambulatory Visit (INDEPENDENT_AMBULATORY_CARE_PROVIDER_SITE_OTHER): Payer: Self-pay | Admitting: Orthopaedic Surgery

## 2017-11-01 ENCOUNTER — Ambulatory Visit (INDEPENDENT_AMBULATORY_CARE_PROVIDER_SITE_OTHER): Payer: Self-pay | Admitting: Psychology

## 2017-11-01 ENCOUNTER — Encounter (INDEPENDENT_AMBULATORY_CARE_PROVIDER_SITE_OTHER): Payer: Self-pay | Admitting: Orthopaedic Surgery

## 2017-11-01 ENCOUNTER — Ambulatory Visit (INDEPENDENT_AMBULATORY_CARE_PROVIDER_SITE_OTHER): Payer: Self-pay

## 2017-11-01 DIAGNOSIS — M542 Cervicalgia: Secondary | ICD-10-CM

## 2017-11-01 DIAGNOSIS — F4312 Post-traumatic stress disorder, chronic: Secondary | ICD-10-CM

## 2017-11-01 DIAGNOSIS — F4481 Dissociative identity disorder: Secondary | ICD-10-CM

## 2017-11-01 MED ORDER — GABAPENTIN 100 MG PO CAPS: 100.0000 mg | ORAL_CAPSULE | Freq: Three times a day (TID) | ORAL | 3 refills | Status: DC

## 2017-11-01 MED ORDER — PREDNISONE 10 MG (21) PO TBPK: ORAL_TABLET | ORAL | 0 refills | Status: DC

## 2017-11-01 NOTE — Progress Notes (Signed)
Office Visit Note   Patient: Miranda [name]           Date of Birth: [birth year]           MRN: [id number] Visit Date: 11/01/2017              Requested by: Miranda Morale, MD [place], [place] [id number] PCP: Miranda Morale, MD   Assessment & Plan: Visit Diagnoses:  1. Neck pain   2. Cervicalgia     Plan: Impression is 42 year old female with cervical radiculopathy the possible double crush syndrome.  Recommend MRI of the cervical spine to rule out structural abilities.  She does have known severe carpal tunnel syndrome.  Prescription for prednisone taper and gabapentin.  Follow-up after the MRI.  Follow-Up Instructions: Return in about 2 weeks (around 11/15/2017).   Orders:  Orders Placed This Encounter  Procedures  . XR Cervical Spine 2 or 3 views  . MR Cervical Spine w/o contrast   Meds ordered this encounter  Medications  . predniSONE (STERAPRED UNI-PAK 21 TAB) 10 MG (21) TBPK tablet    Sig: Take as directed    Dispense:  21 tablet    Refill:  0  . gabapentin (NEURONTIN) 100 MG capsule    Sig: Take 1 capsule (100 mg total) by mouth 3 (three) times daily.    Dispense:  30 capsule    Refill:  3      Procedures: No procedures performed   Clinical Data: No additional findings.   Subjective: Chief Complaint  Patient presents with  . Neck - Pain  . Right Arm - Pain    Miranda [name] presents today for follow-up of her cervical radiculopathy and right shoulder and arm pain.  She states she has pain that radiates down her arm and her hand is drawing up.  Her pain is 7-8 out of 10.  She has tried prednisone taper without any significant relief.  She takes diclofenac with partial relief.  Pain is exacerbated by movement of the head.    Review of Systems  Constitutional: Negative.   HENT: Negative.   Eyes: Negative.   Respiratory: Negative.   Cardiovascular: Negative.   Endocrine: Negative.   Musculoskeletal: Negative.   Neurological:  Negative.   Hematological: Negative.   Psychiatric/Behavioral: Negative.   All other systems reviewed and are negative.    Objective: Vital Signs: LMP 10/22/2017   Physical Exam  Constitutional: She is oriented to person, place, and time. She appears well-developed and well-nourished.  Pulmonary/Chest: Effort normal.  Neurological: She is alert and oriented to person, place, and time.  Skin: Skin is warm. Capillary refill takes less than 2 seconds.  Psychiatric: She has a normal mood and affect. Her behavior is normal. Judgment and thought content normal.  Nursing note and vitals reviewed.   Ortho Exam Cervical spine exam shows positive Spurling sign.  She is also having referral of pain from her right hand. Specialty Comments:  No specialty comments available.  Imaging: Xr Cervical Spine 2 Or 3 Views  Result Date: 11/01/2017 Loss of cervical lordosis.  No acute or structural abnormalities.  Well-preserved disc spaces    PMFS History: Patient Active Problem List   Diagnosis Date Noted  . Type 2 diabetes mellitus without complications ([place]) 83/66/2947  . PTSD (post-traumatic stress disorder) 10/25/2016  . 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 Diabetes mellitus ([place]) 10/25/2016  . Major depressive disorder, recurrent episode, severe, with psychosis ([place]) 10/20/2016  .  Major depressive disorder, recurrent episode, severe, 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[place]) 10/19/2016  . TIA (transient ischemic attack) 05/16/2016  . Arterial ischemic stroke, MCA, left, acute ([place]) 03/29/2016  . Plantar fasciitis 08/08/2013  . Obesity, Class III, BMI 40-49.9 (morbid obesity) ([place]) 04/09/2013  . Unspecified sleep apnea 03/31/2013  . PCO (polycystic ovaries) 04/11/2012  . NECK PAIN 11/21/2010  . FATTY LIVER DISEASE 01/26/2010  . NAUSEA WITH VOMITING 01/26/2010  . ABDOMINAL PAIN, EPIGASTRIC 01/26/2010  . PORTAL HYPERTENSION 01/26/2010  . ABDOMINAL PAIN, RIGHT UPPER QUADRANT  12/27/2009  . Migraine headache 06/28/2009  . ACUTE BRONCHITIS 06/08/2009  . ABDOMINAL PAIN, RIGHT LOWER QUADRANT 11/24/2008  . BREAST MASS 08/21/2008  . KNEE PAIN 04/14/2008  . Hypothyroidism 03/17/2008  . Hyperlipidemia 03/17/2008  . DEPRESSION 03/17/2008  . NEUROPATHY 03/17/2008  . Essential hypertension 03/17/2008  . ASTHMA 03/17/2008  . GERD 03/17/2008  . Headache 03/17/2008   Past Medical History:  Diagnosis Date  . Anxiety   . Arthritis   . Asthma   . Bowel obstruction ([place])   . Cardiac arrhythmia   . Depression    sees Dr. Matilde Haymaker in [place]   . Diabetes mellitus    type 2, on insulin pump, sees Dr. Loanne Drilling   . GERD (gastroesophageal reflux disease)   . Hyperlipidemia   . Hypertension   . Hypothyroidism    sees Dr. Elyse Hsu  . Insomnia   . Migraine syndrome   . Morbid obesity ([place])   . 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 Neck pain   . Neuropathy associated with endocrine disorder ([place])   . Sleep apnea with use of continuous positive airway pressure (CPAP)    Sleep apnea is resolved due to weight loss. 08/2017  . Stroke [facility]) 03-25-16   left MCA   . Stroke [facility]) 04/2016    Family History  Adopted: Yes  Problem Relation Age of Onset  . Heart disease Unknown        on both sides of family  . Diabetes Unknown        on both sides of family    Past Surgical History:  Procedure Laterality Date  . blocked itestinal repair  27   age 42 months  . BREAST BIOPSY Left 2017  . CHOLECYSTECTOMY  2011  . INDUCED ABORTION  1996   forced abortion  . INGUINAL HERNIA REPAIR Left 1980   age 27  . LAPAROSCOPIC ENDOMETRIOSIS FULGURATION  1998  . WISDOM TOOTH EXTRACTION  2007   Social History   Occupational History  . Occupation: Nurse, learning disability  Tobacco Use  . Smoking status: Former Smoker    Packs/day: 0.00    Types: Cigarettes  . Smokeless tobacco: Never Used  . Tobacco comment: quit 03/25/2016  Substance and Sexual Activity  . Alcohol  use: No    Alcohol/week: 0.0 oz  . Drug use: No  . Sexual activity: Yes

## 2017-11-05 ENCOUNTER — Inpatient Hospital Stay: Payer: Self-pay | Admitting: Family Medicine

## 2017-11-05 ENCOUNTER — Ambulatory Visit (INDEPENDENT_AMBULATORY_CARE_PROVIDER_SITE_OTHER): Payer: Self-pay | Admitting: Psychology

## 2017-11-05 DIAGNOSIS — F4312 Post-traumatic stress disorder, chronic: Secondary | ICD-10-CM

## 2017-11-05 DIAGNOSIS — F4481 Dissociative identity disorder: Secondary | ICD-10-CM

## 2017-11-08 ENCOUNTER — Ambulatory Visit (INDEPENDENT_AMBULATORY_CARE_PROVIDER_SITE_OTHER): Payer: Self-pay | Admitting: Psychology

## 2017-11-08 DIAGNOSIS — F4481 Dissociative identity disorder: Secondary | ICD-10-CM

## 2017-11-08 DIAGNOSIS — F4312 Post-traumatic stress disorder, chronic: Secondary | ICD-10-CM

## 2017-11-09 ENCOUNTER — Ambulatory Visit (INDEPENDENT_AMBULATORY_CARE_PROVIDER_SITE_OTHER): Payer: Self-pay | Admitting: Psychology

## 2017-11-09 DIAGNOSIS — F4312 Post-traumatic stress disorder, chronic: Secondary | ICD-10-CM

## 2017-11-09 DIAGNOSIS — F4481 Dissociative identity disorder: Secondary | ICD-10-CM

## 2017-11-10 ENCOUNTER — Other Ambulatory Visit: Payer: Self-pay

## 2017-11-12 ENCOUNTER — Ambulatory Visit (INDEPENDENT_AMBULATORY_CARE_PROVIDER_SITE_OTHER): Payer: Self-pay | Admitting: Psychology

## 2017-11-12 DIAGNOSIS — F4312 Post-traumatic stress disorder, chronic: Secondary | ICD-10-CM

## 2017-11-12 DIAGNOSIS — F4481 Dissociative identity disorder: Secondary | ICD-10-CM

## 2017-11-13 ENCOUNTER — Ambulatory Visit
Admission: RE | Admit: 2017-11-13 | Discharge: 2017-11-13 | Disposition: A | Discharge status: Home / Self Care | Payer: No Typology Code available for payment source | Source: Ambulatory Visit | Attending: Orthopaedic Surgery | Admitting: Orthopaedic Surgery

## 2017-11-13 ENCOUNTER — Other Ambulatory Visit (INDEPENDENT_AMBULATORY_CARE_PROVIDER_SITE_OTHER): Payer: Self-pay | Admitting: Orthopaedic Surgery

## 2017-11-13 DIAGNOSIS — Z950 Presence of cardiac pacemaker: Secondary | ICD-10-CM

## 2017-11-13 DIAGNOSIS — M542 Cervicalgia: Secondary | ICD-10-CM

## 2017-11-15 ENCOUNTER — Ambulatory Visit (INDEPENDENT_AMBULATORY_CARE_PROVIDER_SITE_OTHER): Payer: Self-pay | Admitting: Orthopaedic Surgery

## 2017-11-15 ENCOUNTER — Ambulatory Visit (INDEPENDENT_AMBULATORY_CARE_PROVIDER_SITE_OTHER): Payer: Self-pay | Admitting: Psychology

## 2017-11-15 ENCOUNTER — Other Ambulatory Visit (INDEPENDENT_AMBULATORY_CARE_PROVIDER_SITE_OTHER): Payer: Self-pay

## 2017-11-15 DIAGNOSIS — G5602 Carpal tunnel syndrome, left upper limb: Secondary | ICD-10-CM

## 2017-11-15 DIAGNOSIS — G5601 Carpal tunnel syndrome, right upper limb: Secondary | ICD-10-CM

## 2017-11-15 DIAGNOSIS — M79641 Pain in right hand: Secondary | ICD-10-CM

## 2017-11-15 DIAGNOSIS — F4481 Dissociative identity disorder: Secondary | ICD-10-CM

## 2017-11-15 DIAGNOSIS — F4312 Post-traumatic stress disorder, chronic: Secondary | ICD-10-CM

## 2017-11-15 NOTE — Progress Notes (Signed)
Office Visit Note   Patient: Miranda [name]           Date of Birth: 11-04-1975           MRN: [id number] Visit Date: 11/15/2017              Requested by: Miranda Morale, MD [place], [place] [address] PCP: Miranda Morale, MD   Assessment & Plan: Visit Diagnoses:  1. Left carpal tunnel syndrome   2. Right carpal tunnel syndrome     Plan: The cervical spine was reviewed with the patient which is relatively unremarkable.  I do not see any evidence of impingement.  She is also complaining of her right hand exhibiting similar symptoms to her left hand which has known severe carpal tunnel syndrome.  We will send her to Dr. Ernestina [name] for nerve conduction studies of the right thumb.  Follow-up afterwards.  Follow-Up Instructions: Return if symptoms worsen or fail to improve.   Orders:  No orders of the defined types were placed in this encounter.  No orders of the defined types were placed in this encounter.     Procedures: No procedures performed   Clinical Data: No additional findings.   Subjective: Chief Complaint  Patient presents with  . Neck - Follow-up    Miranda [name] follows up today for her cervical spine MRI.    Review of Systems   Objective: Vital Signs: LMP 10/22/2017   Physical Exam  Ortho Exam Exam is stable. Specialty Comments:  No specialty comments available.  Imaging: No results found.   PMFS History: Patient Active Problem List   Diagnosis Date Noted  . Type 2 diabetes mellitus without complications ([place]) 94/85/4627  . PTSD (post-traumatic stress disorder) 10/25/2016  . 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[place]) 10/25/2016  . Diabetes mellitus ([place]) 10/25/2016  . Major depressive disorder, recurrent episode, severe, with psychosis ([place]) 10/20/2016  . Major depressive disorder, recurrent episode, severe, 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[place]) 10/19/2016  . TIA (transient ischemic attack) 05/16/2016  . Arterial ischemic stroke, MCA,  left, acute ([place]) 03/29/2016  . Plantar fasciitis 08/08/2013  . Obesity, Class III, BMI 40-49.9 (morbid obesity) ([place]) 04/09/2013  . Unspecified sleep apnea 03/31/2013  . PCO (polycystic ovaries) 04/11/2012  . NECK PAIN 11/21/2010  . FATTY LIVER DISEASE 01/26/2010  . NAUSEA WITH VOMITING 01/26/2010  . ABDOMINAL PAIN, EPIGASTRIC 01/26/2010  . PORTAL HYPERTENSION 01/26/2010  . ABDOMINAL PAIN, RIGHT UPPER QUADRANT 12/27/2009  . Migraine headache 06/28/2009  . ACUTE BRONCHITIS 06/08/2009  . ABDOMINAL PAIN, RIGHT LOWER QUADRANT 11/24/2008  . BREAST MASS 08/21/2008  . KNEE PAIN 04/14/2008  . Hypothyroidism 03/17/2008  . Hyperlipidemia 03/17/2008  . DEPRESSION 03/17/2008  . NEUROPATHY 03/17/2008  . Essential hypertension 03/17/2008  . ASTHMA 03/17/2008  . GERD 03/17/2008  . Headache 03/17/2008   Past Medical History:  Diagnosis Date  . Anxiety   . Arthritis   . Asthma   . Bowel obstruction ([place])   . Cardiac arrhythmia   . Depression    sees Dr. Matilde [name] in [place]   . Diabetes mellitus    type 2, on insulin pump, sees Dr. Loanne [name]   . GERD (gastroesophageal reflux disease)   . Hyperlipidemia   . Hypertension   . Hypothyroidism    sees Dr. Elyse [name]  . Insomnia   . Migraine syndrome   . Morbid obesity ([place])   . Multiple personality ([place])   . Neck pain   . Neuropathy associated with endocrine disorder (  [place])   . Sleep apnea with use of continuous positive airway pressure (CPAP)    Sleep apnea is resolved due to weight loss. 08/2017  . Stroke [facility]) 03-25-16   left MCA   . Stroke [facility]) 04/2016    Family History  Adopted: Yes  Problem Relation Age of Onset  . Heart disease Unknown        on both sides of family  . Diabetes Unknown        on both sides of family    Past Surgical History:  Procedure Laterality Date  . blocked itestinal repair  13   age 79 months  . BREAST BIOPSY Left 2017  . CHOLECYSTECTOMY  2011  . INDUCED ABORTION  1996   forced abortion    . INGUINAL HERNIA REPAIR Left 1980   age 57  . LAPAROSCOPIC ENDOMETRIOSIS FULGURATION  1998  . WISDOM TOOTH EXTRACTION  2007   Social History   Occupational History  . Occupation: Nurse, learning disability  Tobacco Use  . Smoking status: Former Smoker    Packs/day: 0.00    Types: Cigarettes  . Smokeless tobacco: Never Used  . Tobacco comment: quit 03/25/2016  Substance and Sexual Activity  . Alcohol use: No    Alcohol/week: 0.0 oz  . Drug use: No  . Sexual activity: Yes

## 2017-11-19 ENCOUNTER — Ambulatory Visit: Payer: Self-pay | Admitting: Psychology

## 2017-11-19 ENCOUNTER — Ambulatory Visit (INDEPENDENT_AMBULATORY_CARE_PROVIDER_SITE_OTHER): Payer: Self-pay | Admitting: Psychology

## 2017-11-19 DIAGNOSIS — F4481 Dissociative identity disorder: Secondary | ICD-10-CM

## 2017-11-19 DIAGNOSIS — F4312 Post-traumatic stress disorder, chronic: Secondary | ICD-10-CM

## 2017-11-20 ENCOUNTER — Telehealth: Payer: Self-pay | Admitting: *Deleted

## 2017-11-20 DIAGNOSIS — G5600 Carpal tunnel syndrome, unspecified upper limb: Secondary | ICD-10-CM

## 2017-11-20 NOTE — Telephone Encounter (Signed)
Copied from [place]. Topic: Referral - Request >> Nov 20, 2017 11:57 AM Arletha Grippe wrote: Reason for CRM: pt is calling to get referral for different ortho in [place]. She would like a 2nd opinion. Her current ortho is recommending surgery  Cb# [phone]

## 2017-11-20 NOTE — Telephone Encounter (Signed)
Sent to PCP ?

## 2017-11-22 ENCOUNTER — Ambulatory Visit (INDEPENDENT_AMBULATORY_CARE_PROVIDER_SITE_OTHER): Payer: Self-pay | Admitting: Psychology

## 2017-11-22 DIAGNOSIS — F4312 Post-traumatic stress disorder, chronic: Secondary | ICD-10-CM

## 2017-11-22 DIAGNOSIS — F4481 Dissociative identity disorder: Secondary | ICD-10-CM

## 2017-11-22 NOTE — Telephone Encounter (Signed)
I just reviewed the MRI scan of her neck and I clearly do not think she needs surgery on her neck. I will refer her to Hand Surgery to discuss carpal tunnel instead

## 2017-11-22 NOTE — Telephone Encounter (Signed)
Pt stated that she is not sure herself this is why she wanted a second option. Pt stated that Dr. Erlinda Hong told her that if she has the carpal tunnel surgery this might possibly fix her spinal issues as well.   Pt would like a second option because she doesn't want to have any surgery if not really necessary if they won't fix her neck issues.   Sent to PCP

## 2017-11-22 NOTE — Telephone Encounter (Signed)
I need more information. She has been seeing Dr. Erlinda Hong for cervical spine disease and for carpal tunnel. Which is he recommending surgery for? If carpal tunnel I would send her to Hand Surgery, if spinal issue I would send her to Neurosurgery

## 2017-11-23 NOTE — Telephone Encounter (Signed)
Called and spoke with pt. Pt advised and voiced understanding.  

## 2017-11-26 ENCOUNTER — Ambulatory Visit (INDEPENDENT_AMBULATORY_CARE_PROVIDER_SITE_OTHER): Payer: Self-pay | Admitting: Psychology

## 2017-11-26 DIAGNOSIS — F4481 Dissociative identity disorder: Secondary | ICD-10-CM

## 2017-11-26 DIAGNOSIS — F4312 Post-traumatic stress disorder, chronic: Secondary | ICD-10-CM

## 2017-11-29 ENCOUNTER — Ambulatory Visit (INDEPENDENT_AMBULATORY_CARE_PROVIDER_SITE_OTHER): Payer: Self-pay | Admitting: Psychology

## 2017-11-29 ENCOUNTER — Encounter (INDEPENDENT_AMBULATORY_CARE_PROVIDER_SITE_OTHER): Payer: Self-pay | Admitting: Physical Medicine and Rehabilitation

## 2017-11-29 ENCOUNTER — Telehealth (INDEPENDENT_AMBULATORY_CARE_PROVIDER_SITE_OTHER): Payer: Self-pay | Admitting: Orthopaedic Surgery

## 2017-11-29 DIAGNOSIS — F4312 Post-traumatic stress disorder, chronic: Secondary | ICD-10-CM

## 2017-11-29 DIAGNOSIS — F4481 Dissociative identity disorder: Secondary | ICD-10-CM

## 2017-11-29 NOTE — Telephone Encounter (Signed)
Ok to increase from 100mg  tid to 200 mg tid

## 2017-11-29 NOTE — Telephone Encounter (Signed)
Patient has a scheduled appt tomorrow and would like to speak to PA about other issues too.

## 2017-11-29 NOTE — Telephone Encounter (Signed)
Please advise if possible 

## 2017-11-30 ENCOUNTER — Telehealth (INDEPENDENT_AMBULATORY_CARE_PROVIDER_SITE_OTHER): Payer: Self-pay | Admitting: Orthopaedic Surgery

## 2017-11-30 ENCOUNTER — Ambulatory Visit (INDEPENDENT_AMBULATORY_CARE_PROVIDER_SITE_OTHER): Payer: Self-pay | Admitting: Physician Assistant

## 2017-11-30 ENCOUNTER — Other Ambulatory Visit (INDEPENDENT_AMBULATORY_CARE_PROVIDER_SITE_OTHER): Payer: Self-pay | Admitting: Physician Assistant

## 2017-11-30 ENCOUNTER — Encounter (INDEPENDENT_AMBULATORY_CARE_PROVIDER_SITE_OTHER): Payer: Self-pay | Admitting: Physician Assistant

## 2017-11-30 DIAGNOSIS — M25511 Pain in right shoulder: Secondary | ICD-10-CM

## 2017-11-30 DIAGNOSIS — G8929 Other chronic pain: Secondary | ICD-10-CM | POA: Insufficient documentation

## 2017-11-30 DIAGNOSIS — M25512 Pain in left shoulder: Secondary | ICD-10-CM

## 2017-11-30 MED ORDER — TIZANIDINE HCL 2 MG PO CAPS: 2.0000 mg | ORAL_CAPSULE | Freq: Two times a day (BID) | ORAL | 0 refills | Status: DC | PRN

## 2017-11-30 MED ORDER — GABAPENTIN 100 MG PO CAPS: ORAL_CAPSULE | ORAL | 3 refills | Status: DC

## 2017-11-30 NOTE — Telephone Encounter (Signed)
Patient called back stating that Mendel Ryder had talked to her about increasing her Gabapentin to 200 mg either 2x a day or 3x a day.  She is wondering does she increase it on her own or will there be a prescription sent to her pharmacy.  She also stated that she will be out of the medication before the normal refill time.  CB#[phone].  Thank you.

## 2017-11-30 NOTE — Telephone Encounter (Signed)
Please advise 

## 2017-11-30 NOTE — Progress Notes (Signed)
Office Visit Note   Patient: Miranda [name]           Date of Birth: [birth year]           MRN: [id number] Visit Date: 11/30/2017              Requested by: Laurey Morale, MD [place], [place] [address] PCP: Laurey Morale, MD   Assessment & Plan: Visit Diagnoses:  1. Chronic pain of both shoulders     Plan: At this point, [place] is shoulder as well as cervical spine have been worked up.  Steroid Dosepaks as well as the subacromial cortisone injection were of minimal relief.  She continues to have pain to both shoulders, both knees and both hands.  I am going to increase her gabapentin to 200 mg 3 times daily.  Going to also give her a small prescription for Zanaflex.  We are going to draw blood for an arthritis panel today to rule out.  She will follow-up with [place] after the nerve conduction study on the right.  Follow-Up Instructions: Return for after NCS right hand.   Orders:  No orders of the defined types were placed in this encounter.  No orders of the defined types were placed in this encounter.     Procedures: No procedures performed   Clinical Data: No additional findings.   Subjective: Chief Complaint  Patient presents with  . Left Shoulder - Pain  . Middle Back - Pain  . Neck - Pain    HPI Miranda [name] comes in with recurrent bilateral shoulder pain left greater than right.  History of chronic pain to both shoulders.  She was seen back in October 2018 for this.  Subacromial injection on the left, which was of minimal relief.  The pain she has is throughout both shoulders and into the arms as well as bilateral parascapular region.  She also notes pain to both knees in the entirety of both hands.  No previous history of rheumatoid arthritis.  She is adopted, does not know her family history.  She has been worked up for cervical spine to include an MRI which was negative.  She has tried 4 mg as well as a 10 mg steroid Dosepak both of which gave her  minimal relief.  She has known severe carpal tunnel syndrome to the left hand and is scheduled for nerve conduction studies to the right as she has similar symptoms there.  She had overslept yesterday and missed her nerve conduction study.  This is rescheduled for 12/18/2017.  She has been taking gabapentin 100 mg 3 times daily with minimal relief.  Review of Systems as detailed in HPI.  All others reviewed and are negative.   Objective: Vital Signs: There were no vitals taken for this visit.  Physical Exam well-developed well-nourished female no acute distress.  Alert and oriented x3.  Ortho Exam examination of both shoulders reveals full active range of motion although this does cause her some discomfort.  Specialty Comments:  No specialty comments available.  Imaging: None today.   PMFS History: Patient Active Problem List   Diagnosis Date Noted  . Chronic pain of both shoulders 11/30/2017  . Type 2 diabetes mellitus without complications ([place]) 93/71/6967  . PTSD (post-traumatic stress disorder) 10/25/2016  . 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[place]) 10/25/2016  . Diabetes mellitus ([place]) 10/25/2016  . Major depressive disorder, recurrent episode, severe, with psychosis ([place]) 10/20/2016  . Major depressive disorder, recurrent episode, severe,  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[place]) 10/19/2016  . TIA (transient ischemic attack) 05/16/2016  . Arterial ischemic stroke, MCA, left, acute ([place]) 03/29/2016  . Plantar fasciitis 08/08/2013  . Obesity, Class III, BMI 40-49.9 (morbid obesity) ([place]) 04/09/2013  . Unspecified sleep apnea 03/31/2013  . PCO (polycystic ovaries) 04/11/2012  . NECK PAIN 11/21/2010  . FATTY LIVER DISEASE 01/26/2010  . NAUSEA WITH VOMITING 01/26/2010  . ABDOMINAL PAIN, EPIGASTRIC 01/26/2010  . PORTAL HYPERTENSION 01/26/2010  . ABDOMINAL PAIN, RIGHT UPPER QUADRANT 12/27/2009  . Migraine headache 06/28/2009  . ACUTE BRONCHITIS 06/08/2009  . ABDOMINAL PAIN, RIGHT LOWER  QUADRANT 11/24/2008  . BREAST MASS 08/21/2008  . KNEE PAIN 04/14/2008  . Hypothyroidism 03/17/2008  . Hyperlipidemia 03/17/2008  . DEPRESSION 03/17/2008  . NEUROPATHY 03/17/2008  . Essential hypertension 03/17/2008  . ASTHMA 03/17/2008  . GERD 03/17/2008  . Headache 03/17/2008   Past Medical History:  Diagnosis Date  . Anxiety   . Arthritis   . Asthma   . Bowel obstruction ([place])   . Cardiac arrhythmia   . Depression    sees Dr. Matilde Haymaker in [place]   . Diabetes mellitus    type 2, on insulin pump, sees Dr. Loanne Drilling   . GERD (gastroesophageal reflux disease)   . Hyperlipidemia   . Hypertension   . Hypothyroidism    sees Dr. Elyse Hsu  . Insomnia   . Migraine syndrome   . Morbid obesity ([place])   . 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 Neck pain   . Neuropathy associated with endocrine disorder ([place])   . Sleep apnea with use of continuous positive airway pressure (CPAP)    Sleep apnea is resolved due to weight loss. 08/2017  . Stroke [facility]) 03-25-16   left MCA   . Stroke [facility]) 04/2016    Family History  Adopted: Yes  Problem Relation Age of Onset  . Heart disease Unknown        on both sides of family  . Diabetes Unknown        on both sides of family    Past Surgical History:  Procedure Laterality Date  . blocked itestinal repair  10   age 85 months  . BREAST BIOPSY Left 2017  . CHOLECYSTECTOMY  2011  . INDUCED ABORTION  1996   forced abortion  . INGUINAL HERNIA REPAIR Left 1980   age 75  . LAPAROSCOPIC ENDOMETRIOSIS FULGURATION  1998  . WISDOM TOOTH EXTRACTION  2007   Social History   Occupational History  . Occupation: Nurse, learning disability  Tobacco Use  . Smoking status: Former Smoker    Packs/day: 0.00    Types: Cigarettes  . Smokeless tobacco: Never Used  . Tobacco comment: quit 03/25/2016  Substance and Sexual Activity  . Alcohol use: No    Alcohol/week: 0.0 oz  . Drug use: No  . Sexual activity: Yes

## 2017-11-30 NOTE — Telephone Encounter (Signed)
Increase to 200mg  3x/day.  Use what she has, but I have also called in a new rx

## 2017-12-03 ENCOUNTER — Ambulatory Visit (INDEPENDENT_AMBULATORY_CARE_PROVIDER_SITE_OTHER): Payer: 59 | Admitting: Psychology

## 2017-12-03 DIAGNOSIS — F4481 Dissociative identity disorder: Secondary | ICD-10-CM

## 2017-12-03 DIAGNOSIS — F4312 Post-traumatic stress disorder, chronic: Secondary | ICD-10-CM | POA: Diagnosis not present

## 2017-12-03 LAB — ANA: Anti Nuclear Antibody(ANA): NEGATIVE

## 2017-12-03 LAB — RHEUMATOID FACTOR: Rhuematoid fact SerPl-aCnc: <14 IU/mL (ref <14)

## 2017-12-03 LAB — SEDIMENTATION RATE: SED RATE: 6 mm/h (ref 0–20)

## 2017-12-03 LAB — URIC ACID: Uric Acid, Serum: 3.8 mg/dL (ref 2.5–7.0)

## 2017-12-03 NOTE — Progress Notes (Signed)
Will you call patient and let her know arthritis panel is negative for rheumatoid

## 2017-12-05 ENCOUNTER — Telehealth (INDEPENDENT_AMBULATORY_CARE_PROVIDER_SITE_OTHER): Payer: Self-pay | Admitting: Physician Assistant

## 2017-12-05 NOTE — Telephone Encounter (Signed)
Patient seeing if her test results are in, she would like a call when possible [phone]

## 2017-12-05 NOTE — Telephone Encounter (Signed)
Called patient with results.  

## 2017-12-06 ENCOUNTER — Ambulatory Visit (INDEPENDENT_AMBULATORY_CARE_PROVIDER_SITE_OTHER): Payer: 59 | Admitting: Psychology

## 2017-12-06 DIAGNOSIS — F4481 Dissociative identity disorder: Secondary | ICD-10-CM | POA: Diagnosis not present

## 2017-12-06 DIAGNOSIS — F4312 Post-traumatic stress disorder, chronic: Secondary | ICD-10-CM

## 2017-12-10 ENCOUNTER — Encounter (HOSPITAL_COMMUNITY): Payer: Self-pay

## 2017-12-10 ENCOUNTER — Ambulatory Visit (INDEPENDENT_AMBULATORY_CARE_PROVIDER_SITE_OTHER): Payer: 59 | Admitting: Psychology

## 2017-12-10 ENCOUNTER — Other Ambulatory Visit: Payer: Self-pay

## 2017-12-10 ENCOUNTER — Ambulatory Visit (HOSPITAL_COMMUNITY)
Admission: EM | Admit: 2017-12-10 | Discharge: 2017-12-10 | Disposition: A | Discharge status: Home / Self Care | Payer: No Typology Code available for payment source | Source: Ambulatory Visit | Attending: Emergency Medicine | Admitting: Emergency Medicine

## 2017-12-10 ENCOUNTER — Emergency Department (HOSPITAL_COMMUNITY)
Admission: EM | Admit: 2017-12-10 | Discharge: 2017-12-11 | Disposition: A | Discharge status: Home / Self Care | Payer: Managed Care, Other (non HMO) | Attending: Emergency Medicine | Admitting: Emergency Medicine

## 2017-12-10 DIAGNOSIS — R109 Unspecified abdominal pain: Secondary | ICD-10-CM | POA: Diagnosis not present

## 2017-12-10 DIAGNOSIS — E039 Hypothyroidism, unspecified: Secondary | ICD-10-CM | POA: Insufficient documentation

## 2017-12-10 DIAGNOSIS — Z6835 Body mass index (BMI) 35.0-35.9, adult: Secondary | ICD-10-CM | POA: Diagnosis not present

## 2017-12-10 DIAGNOSIS — I10 Essential (primary) hypertension: Secondary | ICD-10-CM | POA: Insufficient documentation

## 2017-12-10 DIAGNOSIS — F4481 Dissociative identity disorder: Secondary | ICD-10-CM | POA: Insufficient documentation

## 2017-12-10 DIAGNOSIS — R300 Dysuria: Secondary | ICD-10-CM | POA: Diagnosis not present

## 2017-12-10 DIAGNOSIS — Y939 Activity, unspecified: Secondary | ICD-10-CM | POA: Diagnosis not present

## 2017-12-10 DIAGNOSIS — Y9259 Other trade areas as the place of occurrence of the external cause: Secondary | ICD-10-CM | POA: Diagnosis not present

## 2017-12-10 DIAGNOSIS — Z7984 Long term (current) use of oral hypoglycemic drugs: Secondary | ICD-10-CM | POA: Diagnosis not present

## 2017-12-10 DIAGNOSIS — F39 Unspecified mood [affective] disorder: Secondary | ICD-10-CM | POA: Insufficient documentation

## 2017-12-10 DIAGNOSIS — F17210 Nicotine dependence, cigarettes, uncomplicated: Secondary | ICD-10-CM | POA: Insufficient documentation

## 2017-12-10 DIAGNOSIS — R102 Pelvic and perineal pain: Secondary | ICD-10-CM | POA: Insufficient documentation

## 2017-12-10 DIAGNOSIS — X58XXXA Exposure to other specified factors, initial encounter: Secondary | ICD-10-CM | POA: Diagnosis not present

## 2017-12-10 DIAGNOSIS — F329 Major depressive disorder, single episode, unspecified: Secondary | ICD-10-CM | POA: Diagnosis not present

## 2017-12-10 DIAGNOSIS — Z7901 Long term (current) use of anticoagulants: Secondary | ICD-10-CM | POA: Insufficient documentation

## 2017-12-10 DIAGNOSIS — T7621XA Adult sexual abuse, suspected, initial encounter: Secondary | ICD-10-CM | POA: Diagnosis present

## 2017-12-10 DIAGNOSIS — Y999 Unspecified external cause status: Secondary | ICD-10-CM | POA: Insufficient documentation

## 2017-12-10 DIAGNOSIS — T7421XA Adult sexual abuse, confirmed, initial encounter: Secondary | ICD-10-CM | POA: Diagnosis present

## 2017-12-10 DIAGNOSIS — E119 Type 2 diabetes mellitus without complications: Secondary | ICD-10-CM | POA: Diagnosis not present

## 2017-12-10 DIAGNOSIS — Z794 Long term (current) use of insulin: Secondary | ICD-10-CM | POA: Insufficient documentation

## 2017-12-10 DIAGNOSIS — Z79890 Hormone replacement therapy (postmenopausal): Secondary | ICD-10-CM | POA: Insufficient documentation

## 2017-12-10 DIAGNOSIS — Z8673 Personal history of transient ischemic attack (TIA), and cerebral infarction without residual deficits: Secondary | ICD-10-CM | POA: Insufficient documentation

## 2017-12-10 DIAGNOSIS — J45909 Unspecified asthma, uncomplicated: Secondary | ICD-10-CM | POA: Insufficient documentation

## 2017-12-10 DIAGNOSIS — F4312 Post-traumatic stress disorder, chronic: Secondary | ICD-10-CM

## 2017-12-10 DIAGNOSIS — Z79899 Other long term (current) drug therapy: Secondary | ICD-10-CM | POA: Diagnosis not present

## 2017-12-10 DIAGNOSIS — K219 Gastro-esophageal reflux disease without esophagitis: Secondary | ICD-10-CM | POA: Insufficient documentation

## 2017-12-10 LAB — I-STAT BETA HCG BLOOD, ED (MC, WL, AP ONLY): I-stat hCG, quantitative: <5 m[IU]/mL (ref <5)

## 2017-12-10 LAB — COMPREHENSIVE METABOLIC PANEL
ALT: 20 U/L (ref 14–54)
AST: 24 U/L (ref 15–41)
Albumin: 4 g/dL (ref 3.5–5.0)
Alkaline Phosphatase: 59 U/L (ref 38–126)
Anion gap: 11 (ref 5–15)
BUN: 8 mg/dL (ref 6–20)
CHLORIDE: 106 mmol/L (ref 101–111)
CO2: 21 mmol/L — AB (ref 22–32)
CREATININE: 0.57 mg/dL (ref 0.44–1.00)
Calcium: 9.4 mg/dL (ref 8.9–10.3)
GFR calc Af Amer: >60 mL/min (ref >60)
GFR calc non Af Amer: >60 mL/min (ref >60)
Glucose, Bld: 159 mg/dL — ABNORMAL HIGH (ref 65–99)
Potassium: 3.6 mmol/L (ref 3.5–5.1)
SODIUM: 138 mmol/L (ref 135–145)
Total Bilirubin: 0.5 mg/dL (ref 0.3–1.2)
Total Protein: 7.5 g/dL (ref 6.5–8.1)

## 2017-12-10 LAB — ACETAMINOPHEN LEVEL: Acetaminophen (Tylenol), Serum: <10 ug/mL — ABNORMAL LOW (ref 10–30)

## 2017-12-10 LAB — CBC
HEMATOCRIT: 39.9 % (ref 36.0–46.0)
Hemoglobin: 14.5 g/dL (ref 12.0–15.0)
MCH: 32.2 pg (ref 26.0–34.0)
MCHC: 36.3 g/dL — AB (ref 30.0–36.0)
MCV: 88.7 fL (ref 78.0–100.0)
PLATELETS: 255 10*3/uL (ref 150–400)
RBC: 4.5 MIL/uL (ref 3.87–5.11)
RDW: 13.1 % (ref 11.5–15.5)
WBC: 13.7 10*3/uL — AB (ref 4.0–10.5)

## 2017-12-10 LAB — ETHANOL: Alcohol, Ethyl (B): <10 mg/dL (ref <10)

## 2017-12-10 LAB — SALICYLATE LEVEL: Salicylate Lvl: <7 mg/dL (ref 2.8–30.0)

## 2017-12-10 MED ORDER — METRONIDAZOLE 500 MG PO TABS
2000.0000 mg | ORAL_TABLET | Freq: Once | ORAL | Status: AC
  Administered 2017-12-11: 2000 mg via ORAL

## 2017-12-10 MED ORDER — ULIPRISTAL ACETATE 30 MG PO TABS
30.0000 mg | ORAL_TABLET | Freq: Once | ORAL | Status: AC
  Administered 2017-12-11: 30 mg via ORAL
  Filled 2017-12-10: qty 1

## 2017-12-10 MED ORDER — PROMETHAZINE HCL 25 MG PO TABS
25.0000 mg | ORAL_TABLET | Freq: Four times a day (QID) | ORAL | Status: DC | PRN
  Administered 2017-12-11: 25 mg via ORAL

## 2017-12-10 MED ORDER — AZITHROMYCIN 250 MG PO TABS
1000.0000 mg | ORAL_TABLET | Freq: Once | ORAL | Status: AC
  Administered 2017-12-11: 1000 mg via ORAL

## 2017-12-10 MED ORDER — LIDOCAINE HCL 1 % IJ SOLN
0.9000 mL | Freq: Once | INTRAMUSCULAR | Status: AC
  Administered 2017-12-11: 0.9 mL

## 2017-12-10 MED ORDER — CEFTRIAXONE SODIUM 250 MG IJ SOLR
250.0000 mg | Freq: Once | INTRAMUSCULAR | Status: AC
  Administered 2017-12-11: 250 mg via INTRAMUSCULAR
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
## 2017-12-10 NOTE — ED Notes (Signed)
Unable to collect labs MD in the room with the patient
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
## 2017-12-10 NOTE — ED Notes (Signed)
Bed: [facility]1 Expected date:  Expected time:  Means of arrival:  Comments: 

## 2017-12-10 NOTE — ED Notes (Signed)
Bed: [facility]4 Expected date:  Expected time:  Means of arrival:  Comments: 

## 2017-12-11 LAB — RPR: RPR: NONREACTIVE

## 2017-12-11 LAB — RAPID HIV SCREEN (HIV 1/2 AB+AG)
HIV 1/2 Antibodies: NONREACTIVE
HIV-1 P24 ANTIGEN - HIV24: NONREACTIVE

## 2017-12-11 MED ORDER — ELVITEG-COBIC-EMTRICIT-TENOFAF 150-150-200-10 MG PO TABS: 1.0000 | ORAL_TABLET | Freq: Every day | ORAL | 0 refills | Status: DC

## 2017-12-11 MED ORDER — ELVITEG-COBIC-EMTRICIT-TENOFAF 150-150-200-10 MG PO TABS
1.0000 | ORAL_TABLET | Freq: Every day | ORAL | Status: DC
  Filled 2017-12-11: qty 5

## 2017-12-11 MED ORDER — KETOROLAC TROMETHAMINE 60 MG/2ML IM SOLN
30.0000 mg | Freq: Once | INTRAMUSCULAR | Status: AC
  Administered 2017-12-11: 30 mg via INTRAMUSCULAR
  Filled 2017-12-11: qty 2

## 2017-12-11 MED FILL — GENVOYA TABLET: 150-150-200 | 23 days supply | Qty: 23 | Fill #0

## 2017-12-11 NOTE — Discharge Instructions (Signed)
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 this medicine, you may have a pregnancy outside the womb, which is called an ectopic or tubal pregnancy. Call your health care provider or go to the nearest emergency room right away if you think this is happening. Discuss birth control options with your health care provider. Emergency birth control is not to be used routinely to prevent pregnancy. It should not be used more than once in the same cycle. Birth control pills may not work properly  while you are taking this medicine. Wait at least 5 days after taking this medicine to start or continue other hormone based birth control. Be sure to use a reliable barrier contraceptive method (such as a condom with spermicide) between the time you take this medicine and your next period. This medicine does not protect you against HIV infection (AIDS) or any other sexually transmitted  diseases (STDs). What side effects may I notice from receiving this medicine? Side effects that you should report to your doctor or health care professional as soon as possible: -allergic reactions like skin rash, itching or hives, swelling of the face, lips, or tongue Side effects that usually do not require medical attention (report to your doctor or health care professional if they continue or are bothersome): -dizziness -headache -nausea -spotting -stomach pain -tiredness Where should I keep my medicine? Keep out of the reach of children. Store at between 20 and 25 degrees C (68 and 77 degrees F). Protect from light and keep in the blister card inside the original box until you are ready to take it. Throw away any unused medicine after the expiration date.  2017 Elsevier/Gold Standard (2015-11-18 10:39:30)    Cobicistat; Elvitegravir; Emtricitabine; Tenofovir Alafenamide oral tablets---GENVOYA---4 TABS SENT HOME WITH PATIENT.  1 TAB GIVEN AT BEDSIDE AT ~0145 HOURS ON 12/11/2017.  ADVANCING ACCESS CO-PAY ASSISTANCE STARTED FOR THIS PT.   What is this medicine? COBICISTAT; ELVITEGRAVIR; EMTRICITABINE; TENOFOVIR ALAFENAMIDE (koe BIS i stat; el vye TEG ra veer; em tri SIT uh bean; te NOE fo veer) is three antiretroviral medicines and a medication booster in one tablet. It is used to treat HIV. This medicine is not a cure for HIV. It will not stop the spread of HIV to others. This medicine may be used for other purposes; ask your health care provider or pharmacist if you have questions. COMMON BRAND NAME(S): Genvoya  What should I tell my health care provider before I take this medicine? They need to know if you have any of these conditions: -kidney disease -liver disease -an unusual or allergic reaction to cobicistat, elvitegravir, emtricitabine, tenofovir, other medicines, foods, dyes, or preservatives -pregnant or trying to get pregnant -breast-feeding  How should I use this  medicine? Take this medicine by mouth with a glass of water. Follow the directions on the prescription label. Take this medicine with food. Take your medicine at regular intervals. Do not take your medicine more often than directed. For your anti-HIV therapy to work as well as possible, take each dose exactly as prescribed. Do not skip doses or stop your medicine even if you feel better. Skipping doses may make the HIV virus resistant to this medicine and other medicines. Do not stop taking except on your doctor's advice. Talk to your pediatrician regarding the use of this medicine in children. While this drug may be prescribed for selected conditions, precautions do apply. Overdosage: If you think you have taken too much of this medicine contact a poison control center or emergency room at once. NOTE: This medicine is only for you. Do not share this medicine with others.  What if I miss a dose? If you miss a dose, take it as soon as you can. If it is almost time for your next dose, take only that dose. Do not take double or extra doses.  What may interact with this medicine? Do not take this medicine with any of the following medications: -adefovir -alfuzosin -certain medicines for seizures like carbamazepine, phenobarbital, phenytoin -cisapride -  lumacaftor; ivacaftor -lurasidone -medicines for cholesterol like lovastatin, simvastatin -medicines for headaches like dihydroergotamine, ergotamine, methylergonovine -midazolam -other antiviral medicines for HIV or AIDS -pimozide -rifampin -sildenafil -St. John's wort -triazolam This medicine may also interact with the following medications: -antacids -atorvastatin -bosentan -buprenorphine; naloxone -certain antibiotics like clarithromycin, telithromycin, rifabutin, rifapentine -certain medications for anxiety or sleep like buspirone, clorazepate, diazepam, estazolam, flurazepam, zolpidem -certain medicines for blood pressure or heart  disease like amlodipine, diltiazem, felodipine, metoprolol, nicardipine, nifedipine, timolol, verapamil -certain medicines for depression, anxiety, or psychiatric disturbances -certain medicines for erectile dysfunction like avanafil, sildenafil, tadalafil, vardenafil -certain medicines for fungal infection like itraconazole, ketoconazole, voriconazole -colchicine -cyclosporine -dexamethasone -female hormones, like estrogens and progestins and birth control pills -fluticasone -medicines for infection like acyclovir, cidofovir, valacyclovir, ganciclovir, valganciclovir -medicines for irregular heart beat like amiodarone, bepridil, digoxin, disopyramide, dofetilide, flecainide, lidocaine, mexiletine, propafenone, quinidine -metformin -oxcarbazepine -phenothiazines like perphenazine, risperidone, thioridazine -salmeterol -sirolimus -tacrolimus -warfarin This list may not describe all possible interactions. Give your health care provider a list of all the medicines, herbs, non-prescription drugs, or dietary supplements you use. Also tell them if you smoke, drink alcohol, or use illegal drugs. Some items may interact with your medicine.  What should I watch for while using this medicine? Visit your doctor or health care professional for regular check ups. Discuss any new symptoms with your doctor. You will need to have important blood work done while on this medicine. HIV is spread to others through sexual or blood contact. Talk to your doctor about how to stop the spread of HIV. If you have hepatitis B, talk to your doctor if you plan to stop this medicine. The symptoms of hepatitis B may get worse if you stop this medicine. Birth control pills may not work properly while you are taking this medicine. Talk to your doctor about using an extra method of birth control. Women who can still have children must use a reliable form of barrier contraception, like a condom.  What side effects may I  notice from receiving this medicine? Side effects that you should report to your doctor or health care professional as soon as possible: -allergic reactions like skin rash, itching or hives, swelling of the face, lips, or tongue -breathing problems -fast, irregular heartbeat -muscle pain or weakness -signs and symptoms of kidney injury like trouble passing urine or change in the amount of urine -signs and symptoms of liver injury like dark yellow or brown urine; general ill feeling or flu-like symptoms; light-colored stools; loss of appetite; right upper belly pain; unusually weak or tired; yellowing of the eyes or skin Side effects that usually do not require medical attention (report to your doctor or health care professional if they continue or are bothersome): -diarrhea -headache -nausea -tiredness This list may not describe all possible side effects. Call your doctor for medical advice about side effects. You may report side effects to FDA at 1-800-FDA-1088.  Where should I keep my medicine? Keep out of the reach of children. Store at room temperature below 30 degrees C (86 degrees F). Throw away any unused medicine after the expiration date. NOTE: This sheet is a summary. It may not cover all possible information. If you have questions about this medicine, talk to your doctor, pharmacist, or health care provider.  2018 Elsevier/Gold Standard (2016-07-31 12:54:04)
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
## 2017-12-12 ENCOUNTER — Encounter (HOSPITAL_BASED_OUTPATIENT_CLINIC_OR_DEPARTMENT_OTHER): Payer: Self-pay

## 2017-12-12 ENCOUNTER — Emergency Department (HOSPITAL_BASED_OUTPATIENT_CLINIC_OR_DEPARTMENT_OTHER): Payer: Managed Care, Other (non HMO)

## 2017-12-12 ENCOUNTER — Telehealth: Payer: Self-pay | Admitting: General Practice

## 2017-12-12 ENCOUNTER — Other Ambulatory Visit: Payer: Self-pay

## 2017-12-12 ENCOUNTER — Emergency Department (HOSPITAL_BASED_OUTPATIENT_CLINIC_OR_DEPARTMENT_OTHER)
Admission: EM | Admit: 2017-12-12 | Discharge: 2017-12-13 | Disposition: A | Discharge status: Home / Self Care | Payer: Managed Care, Other (non HMO) | Attending: Emergency Medicine | Admitting: Emergency Medicine

## 2017-12-12 DIAGNOSIS — T7421XD Adult sexual abuse, confirmed, subsequent encounter: Secondary | ICD-10-CM | POA: Diagnosis not present

## 2017-12-12 DIAGNOSIS — R110 Nausea: Secondary | ICD-10-CM | POA: Insufficient documentation

## 2017-12-12 DIAGNOSIS — Y998 Other external cause status: Secondary | ICD-10-CM | POA: Diagnosis not present

## 2017-12-12 DIAGNOSIS — F17210 Nicotine dependence, cigarettes, uncomplicated: Secondary | ICD-10-CM | POA: Insufficient documentation

## 2017-12-12 DIAGNOSIS — S52041A Displaced fracture of coronoid process of right ulna, initial encounter for closed fracture: Secondary | ICD-10-CM | POA: Diagnosis not present

## 2017-12-12 DIAGNOSIS — K625 Hemorrhage of anus and rectum: Secondary | ICD-10-CM | POA: Insufficient documentation

## 2017-12-12 DIAGNOSIS — N938 Other specified abnormal uterine and vaginal bleeding: Secondary | ICD-10-CM | POA: Insufficient documentation

## 2017-12-12 DIAGNOSIS — Y939 Activity, unspecified: Secondary | ICD-10-CM | POA: Insufficient documentation

## 2017-12-12 DIAGNOSIS — R109 Unspecified abdominal pain: Secondary | ICD-10-CM

## 2017-12-12 DIAGNOSIS — Y929 Unspecified place or not applicable: Secondary | ICD-10-CM | POA: Insufficient documentation

## 2017-12-12 DIAGNOSIS — W19XXXA Unspecified fall, initial encounter: Secondary | ICD-10-CM

## 2017-12-12 DIAGNOSIS — N898 Other specified noninflammatory disorders of vagina: Secondary | ICD-10-CM | POA: Insufficient documentation

## 2017-12-12 DIAGNOSIS — M25551 Pain in right hip: Secondary | ICD-10-CM | POA: Insufficient documentation

## 2017-12-12 DIAGNOSIS — R197 Diarrhea, unspecified: Secondary | ICD-10-CM | POA: Insufficient documentation

## 2017-12-12 DIAGNOSIS — W010XXA Fall on same level from slipping, tripping and stumbling without subsequent striking against object, initial encounter: Secondary | ICD-10-CM | POA: Diagnosis not present

## 2017-12-12 DIAGNOSIS — R1030 Lower abdominal pain, unspecified: Secondary | ICD-10-CM | POA: Diagnosis present

## 2017-12-12 LAB — COMPREHENSIVE METABOLIC PANEL
ALK PHOS: 50 U/L (ref 38–126)
ALT: 17 U/L (ref 14–54)
ANION GAP: 9 (ref 5–15)
AST: 21 U/L (ref 15–41)
Albumin: 3.7 g/dL (ref 3.5–5.0)
BILIRUBIN TOTAL: 0.4 mg/dL (ref 0.3–1.2)
BUN: 10 mg/dL (ref 6–20)
CALCIUM: 8.8 mg/dL — AB (ref 8.9–10.3)
CO2: 20 mmol/L — AB (ref 22–32)
CREATININE: 0.64 mg/dL (ref 0.44–1.00)
Chloride: 107 mmol/L (ref 101–111)
GFR calc non Af Amer: >60 mL/min (ref >60)
GLUCOSE: 226 mg/dL — AB (ref 65–99)
Potassium: 3.4 mmol/L — ABNORMAL LOW (ref 3.5–5.1)
Sodium: 136 mmol/L (ref 135–145)
TOTAL PROTEIN: 6.7 g/dL (ref 6.5–8.1)

## 2017-12-12 LAB — CBC WITH DIFFERENTIAL/PLATELET
BASOS PCT: 1 %
Basophils Absolute: 0.1 10*3/uL (ref 0.0–0.1)
EOS ABS: 0.2 10*3/uL (ref 0.0–0.7)
EOS PCT: 2 %
HCT: 35.8 % — ABNORMAL LOW (ref 36.0–46.0)
Hemoglobin: 12.8 g/dL (ref 12.0–15.0)
LYMPHS ABS: 3.2 10*3/uL (ref 0.7–4.0)
Lymphocytes Relative: 33 %
MCH: 31.9 pg (ref 26.0–34.0)
MCHC: 35.8 g/dL (ref 30.0–36.0)
MCV: 89.3 fL (ref 78.0–100.0)
MONOS PCT: 9 %
Monocytes Absolute: 0.9 10*3/uL (ref 0.1–1.0)
NEUTROS PCT: 55 %
Neutro Abs: 5.5 10*3/uL (ref 1.7–7.7)
PLATELETS: 216 10*3/uL (ref 150–400)
RBC: 4.01 MIL/uL (ref 3.87–5.11)
RDW: 13.1 % (ref 11.5–15.5)
WBC: 9.9 10*3/uL (ref 4.0–10.5)

## 2017-12-12 LAB — URINALYSIS, ROUTINE W REFLEX MICROSCOPIC
BILIRUBIN URINE: NEGATIVE
GLUCOSE, UA: NEGATIVE mg/dL
Hgb urine dipstick: NEGATIVE
KETONES UR: 15 mg/dL — AB
LEUKOCYTES UA: NEGATIVE
NITRITE: NEGATIVE
PH: 7.5 (ref 5.0–8.0)
PROTEIN: NEGATIVE mg/dL
Specific Gravity, Urine: 1.02 (ref 1.005–1.030)

## 2017-12-12 LAB — PREGNANCY, URINE: Preg Test, Ur: NEGATIVE

## 2017-12-12 LAB — HEPATITIS B SURFACE ANTIGEN: Hepatitis B Surface Ag: NEGATIVE

## 2017-12-12 LAB — HEPATITIS C ANTIBODY

## 2017-12-12 LAB — LIPASE, BLOOD: Lipase: 24 U/L (ref 11–51)

## 2017-12-12 MED ORDER — IOPAMIDOL (ISOVUE-300) INJECTION 61%
100.0000 mL | Freq: Once | INTRAVENOUS | Status: AC | PRN
  Administered 2017-12-13: 100 mL via INTRAVENOUS

## 2017-12-12 NOTE — ED Notes (Signed)
Patient transported to CT 
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
## 2017-12-12 NOTE — Telephone Encounter (Signed)
Notified patient of appointment scheduled for 12/26/17 at 2:15pm.
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
## 2017-12-13 ENCOUNTER — Encounter: Payer: Self-pay | Admitting: Family Medicine

## 2017-12-13 ENCOUNTER — Ambulatory Visit (INDEPENDENT_AMBULATORY_CARE_PROVIDER_SITE_OTHER): Payer: Managed Care, Other (non HMO) | Admitting: Family Medicine

## 2017-12-13 ENCOUNTER — Emergency Department (HOSPITAL_BASED_OUTPATIENT_CLINIC_OR_DEPARTMENT_OTHER): Payer: Managed Care, Other (non HMO)

## 2017-12-13 ENCOUNTER — Ambulatory Visit: Payer: 59 | Admitting: Psychology

## 2017-12-13 DIAGNOSIS — S59901A Unspecified injury of right elbow, initial encounter: Secondary | ICD-10-CM

## 2017-12-13 MED ORDER — OXYCODONE-ACETAMINOPHEN 5-325 MG PO TABS: 1.0000 | ORAL_TABLET | Freq: Four times a day (QID) | ORAL | 0 refills | Status: DC | PRN

## 2017-12-13 NOTE — Patient Instructions (Signed)
We will go ahead with a CT scan of your elbow to assess if this is an acute fracture and for other associated fractures. Make sure you take the splint off before you have the CT scan. In meantime wear the splint regularly with ACE wraps and sling for comfort. Continue your gabapentin and tizanidine. Take oxycodone as needed for severe pain (don't take tylenol with this). Follow up will depend on that test.

## 2017-12-13 NOTE — ED Notes (Signed)
Patient transported to CT 

## 2017-12-13 NOTE — Discharge Instructions (Addendum)
You were seen in the emergency department for abdominal pain and injuries following a fall. Your blood work did reveal that your blood sugar was elevated at 226- be sure to check this and follow up with your primary care doctor about it. Your lab work was otherwise without significant abnormality. Your CT scan did not find any new findings.   Your X-ray of your elbow showed a very small fracture- you were placed in a splint and sling for this be sure to move the shoulder and take it out of the splint to avoid frozen shoulder. Your other x-rays were negative. Follow up with the orthopedic doctor in your discharge instructions in 3-5 days for re-evaluation.   Home care instructions: -- *PRICE in the first 24-48 hours after injury: Protect (with brace, splint, sling), if given by your provider Rest Ice- Do not apply ice pack directly to your skin, place towel or similar between your skin and ice/ice pack. Apply ice for 20 min, then remove for 40 min while awake Compression-  wear splint applied  Elevate affected extremity above the level of your heart when not walking around for the first 24-48 hours   If your hand or fingers are numb or tingling, appear gray or blue, or you have severe pain (also elevate the arm and loosen splint or wrap if you were given one)  Continue to take your prescribed pain medication.   Additionally follow up with your primary care provider in the next 5 days for re-evaluation of your abdominal pain. Return to the emergency department for new or worsening symptoms or any other concerns you may have.

## 2017-12-16 ENCOUNTER — Encounter: Payer: Self-pay | Admitting: Family Medicine

## 2017-12-16 DIAGNOSIS — S59901A Unspecified injury of right elbow, initial encounter: Secondary | ICD-10-CM | POA: Insufficient documentation

## 2017-12-16 NOTE — Progress Notes (Addendum)
PCP: Laurey Morale, MD  Subjective:   HPI: Patient is a 42 y.o. female here for right elbow injury.  Patient reports on 213 she was holding the collar of her dog when the dog turned around and jerked this arm to run the other way. Caused her to fall down hard onto her right side including the elbow. Immediate pain and some swelling. Pain level currently is 6/10 and sharp around the elbow in the sling and posterior splint. She is taking gabapentin 200 tid, tizanidine, and an old oxycodone tablet. She is right handed. No other skin changes, numbness.  Past Medical History:  Diagnosis Date  . Anxiety   . Arthritis   . Asthma   . Bowel obstruction ([place])   . Cardiac arrhythmia   . Depression    sees Dr. Matilde Haymaker in [place]   . Diabetes mellitus    type 2, on insulin pump, sees Dr. Loanne Drilling   . GERD (gastroesophageal reflux disease)   . Hyperlipidemia   . Hypertension   . Hypothyroidism    sees Dr. Elyse Hsu  . Insomnia   . Migraine syndrome   . Morbid obesity ([place])   . 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 Neck pain   . Neuropathy associated with endocrine disorder ([place])   . Sleep apnea with use of continuous positive airway pressure (CPAP)    Sleep apnea is resolved due to weight loss. 08/2017  . 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 Stroke [facility]) 04/2016    Current Outpatient Medications on File Prior to Visit  Medication Sig Dispense Refill  . albuterol (PROAIR HFA) 108 (90 Base) MCG/ACT inhaler Inhale 2 puffs into the lungs every 4 (four) hours as needed for wheezing. 8.5 g 11  . ARIPiprazole (ABILIFY) 15 MG tablet Take 15 mg by mouth daily.    Marland Kitchen azithromycin (ZITHROMAX) 250 MG tablet As directed 6 tablet 0  . budesonide-formoterol (SYMBICORT) 160-4.5 MCG/ACT inhaler Inhale 2 puffs into the lungs 2 (two) times daily. 1 Inhaler 11  . buPROPion (WELLBUTRIN XL) 300 MG 24 hr tablet Take 1 tablet (300 mg total) by mouth daily. 30 tablet 0  . clopidogrel (PLAVIX) 75 MG tablet  Take 1 tablet (75 mg total) by mouth daily. 90 tablet 3  . diclofenac (VOLTAREN) 75 MG EC tablet Take 1 tablet (75 mg total) by mouth 2 (two) times daily. 30 tablet 0  . elvitegravir-cobicistat-emtricitabine-tenofovir (GENVOYA) 150-150-200-10 MG TABS tablet Take 1 tablet by mouth daily. 5 tablet 0  . ferrous sulfate (FER-IN-SOL) 75 (15 Fe) MG/ML SOLN Take by mouth.    . gabapentin (NEURONTIN) 100 MG capsule Take two capsules po tid 90 capsule 3  . hydrOXYzine (ATARAX/VISTARIL) 50 MG tablet Take 50 mg by mouth. Take 1 tablet every night and may take 3 other times as needed    . ibuprofen (ADVIL,MOTRIN) 800 MG tablet Take 1 tablet (800 mg total) every 8 (eight) hours as needed by mouth. 21 tablet 0  . Insulin Human (INSULIN PUMP) SOLN Inject into the skin. novolog    . levothyroxine (SYNTHROID, LEVOTHROID) 125 MCG tablet Take 1 tablet (125 mcg total) by mouth daily before breakfast. 90 tablet 3  . metFORMIN (GLUCOPHAGE) 1000 MG tablet Take 1 tablet (1,000 mg total) by mouth 2 (two) times daily with a meal. 60 tablet 0  . metoprolol succinate (TOPROL-XL) 25 MG 24 hr tablet Take 0.5 tablets (12.5 mg total) by mouth daily. 30 tablet 0  . Multiple Vitamin (  MULTIVITAMIN) tablet Take 1 tablet by mouth daily.    . pioglitazone (ACTOS) 45 MG tablet Take 1 tablet (45 mg total) by mouth daily. 30 tablet 0  . prazosin (MINIPRESS) 5 MG capsule Take 10 mg by mouth at bedtime.    . promethazine (PHENERGAN) 25 MG tablet Take 1 tablet (25 mg total) every 6 (six) hours as needed by mouth for nausea or vomiting. 10 tablet 0  . rizatriptan (MAXALT) 10 MG tablet Take 1 tablet (10 mg total) by mouth as needed for migraine. May repeat in 2 hours if needed 30 tablet 5  . rosuvastatin (CRESTOR) 20 MG tablet TAKE 1 TABLET(20 MG) BY MOUTH DAILY 30 tablet 11  . sertraline (ZOLOFT) 100 MG tablet Take 200 mg by mouth daily.    . tizanidine (ZANAFLEX) 2 MG capsule Take 1 capsule (2 mg total) by mouth 2 (two) times daily as needed  for muscle spasms. 40 capsule 0  . topiramate (TOPAMAX) 100 MG tablet Take 1 tablet (100 mg total) by mouth 2 (two) times daily. 60 tablet 5  . traZODone (DESYREL) 100 MG tablet Take 1 tablet (100 mg total) by mouth at bedtime. (Patient taking differently: Take 150 mg by mouth at bedtime. ) 30 tablet 0   No current facility-administered medications on file prior to visit.     Past Surgical History:  Procedure Laterality Date  . blocked itestinal repair  86   age 94 months  . BREAST BIOPSY Left 2017  . CHOLECYSTECTOMY  2011  . INDUCED ABORTION  1996   forced abortion  . INGUINAL HERNIA REPAIR Left 1980   age 56  . LAPAROSCOPIC ENDOMETRIOSIS FULGURATION  1998  . WISDOM TOOTH EXTRACTION  2007    Allergies  Allergen Reactions  . Macrobid [Nitrofurantoin] Hives  . Byetta 10 Mcg Pen [Exenatide] Hives  . Clindamycin/Lincomycin Nausea And Vomiting  . Geodon [Ziprasidone Hcl]   . Lipitor [Atorvastatin]     Per patient this causes cramps  . Macrobid [Nitrofurantoin Macrocrystal]   . Penicillins Other (See Comments)    Has patient had a PCN reaction causing immediate rash, facial/tongue/throat swelling, SOB or lightheadedness with hypotension: No Has patient had a PCN reaction causing severe rash involving mucus membranes or skin necrosis: No Has patient had a PCN reaction that required hospitalization No Has patient had a PCN reaction occurring within the last 10 years: No If all of the above answers are "NO", then may proceed with Cephalosporin use.   Marland Kitchen Avocado Diarrhea and Other (See Comments)    Severe cramping, sweats, and diarrhea in upper GI/stomach  . Tramadol Hcl Itching and Rash    Social History   Socioeconomic History  . Marital status: Divorced    Spouse name: Not on file  . Number of children: 5  . Years of education: Not on file  . Highest education level: Not on file  Social Needs  . Financial resource strain: Not on file  . Food insecurity - worry: Not on  file  . Food insecurity - inability: Not on file  . Transportation needs - medical: Not on file  . Transportation needs - non-medical: Not on file  Occupational History  . Occupation: Nurse, learning disability  Tobacco Use  . Smoking status: Current Every Day Smoker    Packs/day: 0.00    Types: Cigarettes  . Smokeless tobacco: Never Used  . Tobacco comment: quit 03/25/2016  Substance and Sexual Activity  . Alcohol use: No    Alcohol/week:  0.0 oz  . Drug use: Yes    Types: Marijuana  . Sexual activity: Not on file  Other Topics Concern  . Not on file  Social History Narrative  . Not on file    Family History  Adopted: Yes  Problem Relation Age of Onset  . Heart disease Unknown        on both sides of family  . Diabetes Unknown        on both sides of family    BP 112/79   Pulse 67   Ht 5\' 2"  (1.575 m)   Wt 189 lb (85.7 kg)   LMP 11/24/2017   BMI 34.57 kg/m   Review of Systems: See HPI above.     Objective:  Physical Exam:  Gen: NAD, comfortable in exam room  Right elbow: Splint removed. No gross deformity.  Mild soft tissue swelling but none in supracondylar region.  No bruising. TTP circumferentially about elbow. FROM digits and wrist with 5/5 strength.  Full elbow flexion, lacks about 15 degrees extension 2/2 pain. Collateral ligaments not tested given level of pain, possible fracture. NVI distally.  Left elbow: No deformity. FROM with 5/5 strength. No tenderness to palpation. NVI distally.   Assessment & Plan:  1. Right elbow injury - independently reviewed radiographs.  There is no effusion present but an unusual appearing bony fragment off proximal ulna (coronoid).  Would be unusual to have an acute fracture here without other associated injuries and/or without an elbow effusion.  We discussed possible this is due to an old injury or accessory ossicle.  However, recommended we go ahead with CT of the elbow without contrast to assess for  associated fractures that would typically accompany a fracture like this with possibly needing surgical intervention depending on those results.  Continue with posterior splint with sling.  Gabapentin with tizanidine and oxycodone as needed.  Addendum:  CT reviewed and discussed with patient - two small ossicles are not acute, consistent with old fractures or accessory ossicles.  Advised to stop with posterior splint.  Use sling only if needed.  Reviewed home exercises to regain her motion.  She will consider physical therapy as well (working with Mitzi Hansen at [employer] for other issues) - advised to f/u with [place] in 1-2 weeks.

## 2017-12-16 NOTE — Assessment & Plan Note (Signed)
independently reviewed radiographs.  There is no effusion present but an unusual appearing bony fragment off proximal ulna (coronoid).  Would be unusual to have an acute fracture here without other associated injuries and/or without an elbow effusion.  We discussed possible this is due to an old injury or accessory ossicle.  However, recommended we go ahead with CT of the elbow without contrast to assess for associated fractures that would typically accompany a fracture like this with possibly needing surgical intervention depending on those results.  Continue with posterior splint with sling.  Gabapentin with tizanidine and oxycodone as needed.

## 2017-12-17 ENCOUNTER — Ambulatory Visit: Payer: Self-pay | Admitting: Psychology

## 2017-12-17 ENCOUNTER — Encounter (HOSPITAL_COMMUNITY): Payer: Self-pay | Admitting: *Deleted

## 2017-12-17 ENCOUNTER — Encounter: Payer: Self-pay | Admitting: Family Medicine

## 2017-12-17 ENCOUNTER — Inpatient Hospital Stay (HOSPITAL_COMMUNITY)
Admission: AD | Admit: 2017-12-17 | Discharge: 2017-12-17 | Disposition: A | Discharge status: Home / Self Care | Payer: Managed Care, Other (non HMO) | Source: Ambulatory Visit | Attending: Obstetrics and Gynecology | Admitting: Obstetrics and Gynecology

## 2017-12-17 ENCOUNTER — Other Ambulatory Visit: Payer: Self-pay

## 2017-12-17 ENCOUNTER — Ambulatory Visit: Payer: 59 | Admitting: Psychology

## 2017-12-17 DIAGNOSIS — Z794 Long term (current) use of insulin: Secondary | ICD-10-CM | POA: Insufficient documentation

## 2017-12-17 DIAGNOSIS — Z8673 Personal history of transient ischemic attack (TIA), and cerebral infarction without residual deficits: Secondary | ICD-10-CM | POA: Diagnosis not present

## 2017-12-17 DIAGNOSIS — I10 Essential (primary) hypertension: Secondary | ICD-10-CM | POA: Diagnosis not present

## 2017-12-17 DIAGNOSIS — J45909 Unspecified asthma, uncomplicated: Secondary | ICD-10-CM | POA: Diagnosis not present

## 2017-12-17 DIAGNOSIS — F17210 Nicotine dependence, cigarettes, uncomplicated: Secondary | ICD-10-CM | POA: Insufficient documentation

## 2017-12-17 DIAGNOSIS — K219 Gastro-esophageal reflux disease without esophagitis: Secondary | ICD-10-CM | POA: Insufficient documentation

## 2017-12-17 DIAGNOSIS — E785 Hyperlipidemia, unspecified: Secondary | ICD-10-CM | POA: Insufficient documentation

## 2017-12-17 DIAGNOSIS — Z881 Allergy status to other antibiotic agents status: Secondary | ICD-10-CM | POA: Insufficient documentation

## 2017-12-17 DIAGNOSIS — Z6835 Body mass index (BMI) 35.0-35.9, adult: Secondary | ICD-10-CM | POA: Insufficient documentation

## 2017-12-17 DIAGNOSIS — E039 Hypothyroidism, unspecified: Secondary | ICD-10-CM | POA: Diagnosis not present

## 2017-12-17 DIAGNOSIS — Z880 Allergy status to penicillin: Secondary | ICD-10-CM | POA: Diagnosis not present

## 2017-12-17 DIAGNOSIS — E119 Type 2 diabetes mellitus without complications: Secondary | ICD-10-CM | POA: Diagnosis not present

## 2017-12-17 DIAGNOSIS — F329 Major depressive disorder, single episode, unspecified: Secondary | ICD-10-CM | POA: Diagnosis not present

## 2017-12-17 DIAGNOSIS — N939 Abnormal uterine and vaginal bleeding, unspecified: Secondary | ICD-10-CM | POA: Insufficient documentation

## 2017-12-17 DIAGNOSIS — Z79899 Other long term (current) drug therapy: Secondary | ICD-10-CM | POA: Insufficient documentation

## 2017-12-17 LAB — URINALYSIS, ROUTINE W REFLEX MICROSCOPIC
Glucose, UA: NEGATIVE mg/dL
Hgb urine dipstick: NEGATIVE
KETONES UR: NEGATIVE mg/dL
Leukocytes, UA: NEGATIVE
Nitrite: NEGATIVE
PROTEIN: 30 mg/dL — AB
Specific Gravity, Urine: 1.031 — ABNORMAL HIGH (ref 1.005–1.030)
pH: 7 (ref 5.0–8.0)

## 2017-12-17 LAB — WET PREP, GENITAL
CLUE CELLS WET PREP: NONE SEEN
Sperm: NONE SEEN
TRICH WET PREP: NONE SEEN
WBC, Wet Prep HPF POC: NONE SEEN
Yeast Wet Prep HPF POC: NONE SEEN

## 2017-12-17 NOTE — Discharge Instructions (Signed)
Dysfunctional Uterine Bleeding °Dysfunctional uterine bleeding is abnormal bleeding from the uterus. Dysfunctional uterine bleeding includes: °· A period that comes earlier or later than usual. °· A period that is lighter, heavier, or has blood clots. °· Bleeding between periods. °· Skipping one or more periods. °· Bleeding after sexual intercourse. °· Bleeding after menopause. ° °Follow these instructions at home: °Pay attention to any changes in your symptoms. Follow these instructions to help with your condition: °Eating and drinking °· Eat well-balanced meals. Include foods that are high in iron, such as liver, meat, shellfish, green leafy vegetables, and eggs. °· If you become constipated: °? Drink plenty of water. °? Eat fruits and vegetables that are high in water and fiber, such as spinach, carrots, raspberries, apples, and mango. °Medicines °· Take over-the-counter and prescription medicines only as told by your health care provider. °· Do not change medicines without talking with your health care provider. °· Aspirin or medicines that contain aspirin may make the bleeding worse. Do not take those medicines: °? During the week before your period. °? During your period. °· If you were prescribed iron pills, take them as told by your health care provider. Iron pills help to replace iron that your body loses because of this condition. °Activity °· If you need to change your sanitary pad or tampon more than one time every 2 hours: °? Lie in bed with your feet raised (elevated). °? Place a cold pack on your lower abdomen. °? Rest as much as possible until the bleeding stops or slows down. °· Do not try to lose weight until the bleeding has stopped and your blood iron level is back to normal. °Other Instructions °· For two months, write down: °? When your period starts. °? When your period ends. °? When any abnormal bleeding occurs. °? What problems you notice. °· Keep all follow up visits as told by your health  care provider. This is important. °Contact a health care provider if: °· You get light-headed or weak. °· You have nausea and vomiting. °· You cannot eat or drink without vomiting. °· You feel dizzy or have diarrhea while you are taking medicines. °· You are taking birth control pills or hormones, and you want to change them or stop taking them. °Get help right away if: °· You develop a fever or chills. °· You need to change your sanitary pad or tampon more than one time per hour. °· Your bleeding becomes heavier, or your flow contains clots more often. °· You develop pain in your abdomen. °· You lose consciousness. °· You develop a rash. °This information is not intended to replace advice given to you by your health care provider. Make sure you discuss any questions you have with your health care provider. °Document Released: 10/13/2000 Document Revised: 03/23/2016 Document Reviewed: 01/11/2015 °Elsevier Interactive Patient Education © 2018 [employer] Inc[employer] ° °
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
## 2017-12-17 NOTE — Consult Note (Signed)
ON 12/11/2017, AT APPROXIMATELY 1218 HOURS, ADVANCING ACCESS PROVIDED THE FOLLOWING COPAY ASSISTANCE VOUCHER NUMBERS FOR THE PT, IN REFERENCE TO HIV nPEP FOR GENVOYA:  CARD ID #:  [id number]  GROUP #:  [id number]  BIN #:  [id number]  PCN #:  [id number]  THE ABOVE INFORMATION WAS THEN SENT TO THE [facility] ([facility]).
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
## 2017-12-18 ENCOUNTER — Encounter (INDEPENDENT_AMBULATORY_CARE_PROVIDER_SITE_OTHER): Payer: Self-pay | Admitting: Physical Medicine and Rehabilitation

## 2017-12-18 ENCOUNTER — Ambulatory Visit (INDEPENDENT_AMBULATORY_CARE_PROVIDER_SITE_OTHER): Payer: Managed Care, Other (non HMO) | Admitting: Physical Medicine and Rehabilitation

## 2017-12-18 DIAGNOSIS — R202 Paresthesia of skin: Secondary | ICD-10-CM | POA: Diagnosis not present

## 2017-12-18 LAB — GC/CHLAMYDIA PROBE AMP (~~LOC~~) NOT AT ARMC
Chlamydia: NEGATIVE
Neisseria Gonorrhea: NEGATIVE

## 2017-12-18 NOTE — Progress Notes (Deleted)
Right shoulder and right sided neck pain. Had numbness and tingling, but not recently. Worse with writing, scanning at work, lifting crates.Currently has a fractured elbow- fell last Thursday and is awaiting CT scan.

## 2017-12-19 ENCOUNTER — Encounter: Payer: Self-pay | Admitting: Family Medicine

## 2017-12-19 ENCOUNTER — Ambulatory Visit (INDEPENDENT_AMBULATORY_CARE_PROVIDER_SITE_OTHER): Payer: Managed Care, Other (non HMO) | Admitting: Family Medicine

## 2017-12-19 ENCOUNTER — Ambulatory Visit (HOSPITAL_BASED_OUTPATIENT_CLINIC_OR_DEPARTMENT_OTHER)
Admission: RE | Admit: 2017-12-19 | Discharge: 2017-12-19 | Disposition: A | Discharge status: Home / Self Care | Payer: Managed Care, Other (non HMO) | Source: Ambulatory Visit | Attending: Family Medicine | Admitting: Family Medicine

## 2017-12-19 VITALS — BP 110/78 | HR 87 | Temp 98.7°F | Wt 192.2 lb

## 2017-12-19 DIAGNOSIS — F4310 Post-traumatic stress disorder, unspecified: Secondary | ICD-10-CM

## 2017-12-19 DIAGNOSIS — F333 Major depressive disorder, recurrent, severe with psychotic symptoms: Secondary | ICD-10-CM

## 2017-12-19 DIAGNOSIS — R234 Changes in skin texture: Secondary | ICD-10-CM | POA: Diagnosis not present

## 2017-12-19 DIAGNOSIS — S59901S Unspecified injury of right elbow, sequela: Secondary | ICD-10-CM | POA: Diagnosis not present

## 2017-12-19 DIAGNOSIS — S59901A Unspecified injury of right elbow, initial encounter: Secondary | ICD-10-CM

## 2017-12-19 DIAGNOSIS — F4481 Dissociative identity disorder: Secondary | ICD-10-CM

## 2017-12-19 DIAGNOSIS — X58XXXA Exposure to other specified factors, initial encounter: Secondary | ICD-10-CM | POA: Diagnosis not present

## 2017-12-19 DIAGNOSIS — T7421XS Adult sexual abuse, confirmed, sequela: Secondary | ICD-10-CM | POA: Diagnosis not present

## 2017-12-19 NOTE — Addendum Note (Signed)
Addended by: Sherrie George F on: 12/19/2017 12:16 PM   Modules accepted: Orders
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
## 2017-12-20 ENCOUNTER — Ambulatory Visit (INDEPENDENT_AMBULATORY_CARE_PROVIDER_SITE_OTHER): Payer: 59 | Admitting: Psychology

## 2017-12-20 ENCOUNTER — Ambulatory Visit: Payer: Self-pay | Admitting: Psychology

## 2017-12-20 DIAGNOSIS — F4312 Post-traumatic stress disorder, chronic: Secondary | ICD-10-CM | POA: Diagnosis not present

## 2017-12-20 NOTE — Progress Notes (Signed)
Miranda [name] - 42 y.o. female MRN [id number]  Date of birth: Apr 28, 1976  Office Visit Note: Visit Date: 12/18/2017 PCP: Laurey Morale, MD Referred by: Laurey Morale, MD  Subjective: Chief Complaint  Patient presents with  . Right Arm - Pain   HPI: Miranda [name] is a 42 year old [facility] of Dr. Erlinda Hong for evaluation for electrodiagnostic standpoint of her right upper extremity.  She is right-hand dominant.  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  She continues to have neck pain at times with some referral in the arms.  She is having more right-sided neck pain and right shoulder pain.  She has had numbness and tingling but not as much recently.  She reports that her hand is worse with writing and doing scanning at work and lifting crates.  Reports that she has had a fall Thursday and is awaiting a CT scan of the elbow.  She did have her elbow wrapped in an Ace bandage today.  She is going to follow up with Dr. Erlinda Hong after the study scans.  Her case is fairly complicated medically for a 42 year old patient, history of PTSD and type 2 diabetes and history of transient ischemic attacks and history of neuropathy.    ROS Otherwise per HPI.  Assessment & Plan: Visit Diagnoses:  1. Paresthesia of skin     Plan: No additional findings.  Impression: The above electrodiagnostic study is ABNORMAL and reveals evidence of a moderate right median nerve entrapment at the wrist (carpal tunnel syndrome) affecting sensory and motor components.   There is no significant electrodiagnostic evidence of any other focal nerve entrapment, brachial plexopathy or cervical radiculopathy.   As you know, this particular electrodiagnostic study cannot rule out chemical radiculitis or sensory only radiculopathy.  Recommendations: 1.  Follow-up with referring physician. 2.  Continue current management of symptoms. 3.   Continue use of resting splint at night-time and as needed during the day. 4.  Suggest surgical evaluation.   Meds & Orders: No orders of the defined types were placed in this encounter.   Orders Placed This Encounter  Procedures  . NCV with EMG (electromyography)    Follow-up: Return for Dr. Erlinda Hong.   Procedures: No procedures performed  EMG & NCV Findings: Evaluation of the right median motor nerve showed prolonged distal onset latency (5.2 ms) and decreased conduction velocity (Elbow-Wrist, 42 m/s).  The right median (across palm) sensory nerve showed no response (Palm) and prolonged distal peak latency (4.8 ms).  The right ulnar sensory nerve showed decreased conduction velocity (Wrist-5th Digit, 36 m/s).  All remaining nerves (as indicated in the following tables) were within normal limits.    All examined muscles (as indicated in the following table) showed no evidence of electrical instability.    Impression: The above electrodiagnostic study is ABNORMAL and reveals evidence of a moderate right median nerve entrapment at the wrist (carpal tunnel syndrome) affecting sensory and motor components.   There is no significant electrodiagnostic evidence of any other focal nerve entrapment, brachial plexopathy or cervical radiculopathy.   As you know, this particular electrodiagnostic study cannot rule out chemical radiculitis or sensory only radiculopathy.  Recommendations: 1.  Follow-up with referring physician. 2.  Continue current management of symptoms. 3.  Continue use of resting splint at night-time and as needed during the day. 4.  Suggest surgical evaluation.  Nerve Conduction Studies Anti Sensory Summary  Table   Stim Site NR Peak (ms) Norm Peak (ms) P-T Amp (V) Norm P-T Amp Site1 Site2 Delta-P (ms) Dist (cm) Vel (m/s) Norm Vel (m/s)  Right Median Acr Palm Anti Sensory (2nd Digit)  31.7C  Wrist    *4.8 <3.6 33.9 >10 Wrist Palm  0.0    Palm *NR  <2.0          Right Radial  Anti Sensory (Base 1st Digit)  31.4C  Wrist    2.4 <3.1 21.7  Wrist Base 1st Digit 2.4 0.0    Right Ulnar Anti Sensory (5th Digit)  32.1C  Wrist    3.9 <4.0 21.4 >15.0 Wrist 5th Digit 3.9 14.0 *36 >38   Motor Summary Table   Stim Site NR Onset (ms) Norm Onset (ms) O-P Amp (mV) Norm O-P Amp Site1 Site2 Delta-0 (ms) Dist (cm) Vel (m/s) Norm Vel (m/s)  Right Median Motor (Abd Poll Brev)  31.1C  Wrist    *5.2 <4.2 5.5 >5 Elbow Wrist 4.3 18.0 *42 >50  Elbow    9.5  4.7         Right Ulnar Motor (Abd Dig Min)  31.1C  Wrist    3.1 <4.2 8.0 >3 B Elbow Wrist 3.3 18.0 55 >53  B Elbow    6.4  7.5  A Elbow B Elbow 1.5 10.0 67 >53  A Elbow    7.9  7.6          EMG   Side Muscle Nerve Root Ins Act Fibs Psw Amp Dur Poly Recrt Int Fraser Din Comment  Right Abd Poll Brev Median C8-T1 Nml Nml Nml Nml Nml 0 Nml Nml   Right 1stDorInt Ulnar C8-T1 Nml Nml Nml Nml Nml 0 Nml Nml   Right PronatorTeres Median C6-7 Nml Nml Nml Nml Nml 0 Nml Nml   Right Biceps Musculocut C5-6 Nml Nml Nml Nml Nml 0 Nml Nml   Right Deltoid Axillary C5-6 Nml Nml Nml Nml Nml 0 Nml Nml     Nerve Conduction Studies Anti Sensory Left/Right Comparison   Stim Site L Lat (ms) R Lat (ms) L-R Lat (ms) L Amp (V) R Amp (V) L-R Amp (%) Site1 Site2 L Vel (m/s) R Vel (m/s) L-R Vel (m/s)  Median Acr Palm Anti Sensory (2nd Digit)  31.7C  Wrist  *4.8   33.9  Wrist Palm     Palm             Radial Anti Sensory (Base 1st Digit)  31.4C  Wrist  2.4   21.7  Wrist Base 1st Digit     Ulnar Anti Sensory (5th Digit)  32.1C  Wrist  3.9   21.4  Wrist 5th Digit  *36    Motor Left/Right Comparison   Stim Site L Lat (ms) R Lat (ms) L-R Lat (ms) L Amp (mV) R Amp (mV) L-R Amp (%) Site1 Site2 L Vel (m/s) R Vel (m/s) L-R Vel (m/s)  Median Motor (Abd Poll Brev)  31.1C  Wrist  *5.2   5.5  Elbow Wrist  *42   Elbow  9.5   4.7        Ulnar Motor (Abd Dig Min)  31.1C  Wrist  3.1   8.0  B Elbow Wrist  55   B Elbow  6.4   7.5  A Elbow B Elbow  67   A  Elbow  7.9   7.6           Waveforms:  Clinical History: MMRI CERVICAL SPINE WITHOUT CONTRAST  TECHNIQUE: Multiplanar, multisequence MR imaging of the cervical spine was performed. No intravenous contrast was administered.  COMPARISON: Cervical spine radiographs 11/01/2017, MRI 03/08/2008  FINDINGS: Alignment: Normal alignment with straightening of the cervical lordosis.  Vertebrae: Negative for fracture or mass  Cord: Normal signal and morphology  Posterior Fossa, vertebral arteries, paraspinal tissues: Negative  Disc levels:  C2-3: Negative  C3-4: Negative  C4-5: Small central disc protrusion with downgoing disc material. This is touching the cord without significant stenosis  C5-6: Small central disc/osteophyte without stenosis  C6-7: Negative  C7-T1: Negative  IMPRESSION: Mild disc degeneration with small central disc protrusions at C4-5 and C5-6. No significant neural impingement.   Electronically Signed By: Franchot Gallo M.D. On: 11/13/2017 12:20  EMG/NCS LEFT UE 10/03/2017  Impression: The above electrodiagnostic study is ABNORMAL and reveals evidence of a moderate to severe left median nerve entrapment at the wrist (carpal tunnel syndrome) affecting sensory and motor components.   There is no significant electrodiagnostic evidence of any other focal nerve entrapment, brachial plexopathy or cervical radiculopathy.   As you know, this particular electrodiagnostic study cannot rule out chemical radiculitis or sensory only radiculopathy.  This electrodiagnostic study cannot rule out small fiber polyneuropathy and dysesthesias from central pain sensitization syndromes such as fibromyalgia.  She reports that she has been smoking cigarettes.  She has been smoking about 0.00 packs per day. she has never used smokeless tobacco.  Recent Labs    02/02/17 11/30/17 0919  HGBA1C 7.1  --   LABURIC  --  3.8    Objective:  VS:   HT:    WT:   BMI:     BP:   HR: bpm  TEMP: ( )  RESP:  Physical Exam  Musculoskeletal:  Inspection reveals no atrophy of the bilateral APB or FDI or hand intrinsics. There is no swelling, color changes, allodynia or dystrophic changes. There is 5 out of 5 strength in the bilateral wrist extension, finger abduction and long finger flexion. There is intact sensation to light touch in all dermatomal and peripheral nerve distributions. There is a negative Phalen's test bilaterally. There is a negative Hoffmann's test bilaterally.    Ortho Exam Imaging: No results found.  Past Medical/Family/Surgical/Social History: Medications & Allergies reviewed per EMR Patient Active Problem List   Diagnosis Date Noted  . Elbow injury, right, initial encounter 12/16/2017  . Chronic pain of both shoulders 11/30/2017  . Type 2 diabetes mellitus without complications ([place]) 95/62/1308  . PTSD (post-traumatic stress disorder) 10/25/2016  . 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 Diabetes mellitus ([place]) 10/25/2016  . Major depressive disorder, recurrent episode, severe, with psychosis ([place]) 10/20/2016  . Major depressive disorder, recurrent episode, severe, 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[place]) 10/19/2016  . TIA (transient ischemic attack) 05/16/2016  . Arterial ischemic stroke, MCA, left, acute ([place]) 03/29/2016  . Plantar fasciitis 08/08/2013  . Obesity, Class III, BMI 40-49.9 (morbid obesity) ([place]) 04/09/2013  . Unspecified sleep apnea 03/31/2013  . PCO (polycystic ovaries) 04/11/2012  . NECK PAIN 11/21/2010  . FATTY LIVER DISEASE 01/26/2010  . NAUSEA WITH VOMITING 01/26/2010  . ABDOMINAL PAIN, EPIGASTRIC 01/26/2010  . PORTAL HYPERTENSION 01/26/2010  . ABDOMINAL PAIN, RIGHT UPPER QUADRANT 12/27/2009  . Migraine headache 06/28/2009  . ACUTE BRONCHITIS 06/08/2009  . ABDOMINAL PAIN, RIGHT LOWER QUADRANT 11/24/2008  . BREAST MASS 08/21/2008  . KNEE PAIN 04/14/2008  . Hypothyroidism 03/17/2008    . Hyperlipidemia 03/17/2008  . DEPRESSION 03/17/2008  .  NEUROPATHY 03/17/2008  . Essential hypertension 03/17/2008  . ASTHMA 03/17/2008  . GERD 03/17/2008  . Headache 03/17/2008   Past Medical History:  Diagnosis Date  . Anxiety   . Arthritis   . Asthma   . Bowel obstruction ([place])   . Cardiac arrhythmia   . Depression    sees Dr. Matilde Haymaker in [place]   . Diabetes mellitus    type 2, on insulin pump, sees Dr. Loanne Drilling   . GERD (gastroesophageal reflux disease)   . Hyperlipidemia   . Hypertension   . Hypothyroidism    sees Dr. Elyse Hsu  . Insomnia   . Migraine syndrome   . Morbid obesity ([place])   . 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 Neck pain   . Neuropathy associated with endocrine disorder ([place])   . Sleep apnea with use of continuous positive airway pressure (CPAP)    Sleep apnea is resolved due to weight loss. 08/2017  . Stroke [facility]) 03-25-16   left MCA   . Stroke [facility]) 04/2016   Family History  Adopted: Yes  Problem Relation Age of Onset  . Heart disease Unknown        on both sides of family  . Diabetes Unknown        on both sides of family   Past Surgical History:  Procedure Laterality Date  . blocked itestinal repair  23   age 28 months  . BREAST BIOPSY Left 2017  . CHOLECYSTECTOMY  2011  . INDUCED ABORTION  1996   forced abortion  . INGUINAL HERNIA REPAIR Left 1980   age 43  . LAPAROSCOPIC ENDOMETRIOSIS FULGURATION  1998  . WISDOM TOOTH EXTRACTION  2007   Social History   Occupational History  . Occupation: Nurse, learning disability  Tobacco Use  . Smoking status: Current Every Day Smoker    Packs/day: 0.00    Types: Cigarettes  . Smokeless tobacco: Never Used  . Tobacco comment: quit 03/25/2016  Substance and Sexual Activity  . Alcohol use: No    Alcohol/week: 0.0 oz  . Drug use: Yes    Types: Marijuana    Comment: 2/10 last marijuana use  . Sexual activity: Not on file

## 2017-12-21 NOTE — Procedures (Signed)
EMG & NCV Findings: Evaluation of the right median motor nerve showed prolonged distal onset latency (5.2 ms) and decreased conduction velocity (Elbow-Wrist, 42 m/s).  The right median (across palm) sensory nerve showed no response (Palm) and prolonged distal peak latency (4.8 ms).  The right ulnar sensory nerve showed decreased conduction velocity (Wrist-5th Digit, 36 m/s).  All remaining nerves (as indicated in the following tables) were within normal limits.    All examined muscles (as indicated in the following table) showed no evidence of electrical instability.    Impression: The above electrodiagnostic study is ABNORMAL and reveals evidence of a moderate right median nerve entrapment at the wrist (carpal tunnel syndrome) affecting sensory and motor components.   There is no significant electrodiagnostic evidence of any other focal nerve entrapment, brachial plexopathy or cervical radiculopathy.   As you know, this particular electrodiagnostic study cannot rule out chemical radiculitis or sensory only radiculopathy.  Recommendations: 1.  Follow-up with referring physician. 2.  Continue current management of symptoms. 3.  Continue use of resting splint at night-time and as needed during the day. 4.  Suggest surgical evaluation.  Nerve Conduction Studies Anti Sensory Summary Table   Stim Site NR Peak (ms) Norm Peak (ms) P-T Amp (V) Norm P-T Amp Site1 Site2 Delta-P (ms) Dist (cm) Vel (m/s) Norm Vel (m/s)  Right Median Acr Palm Anti Sensory (2nd Digit)  31.7C  Wrist    *4.8 <3.6 33.9 >10 Wrist Palm  0.0    Palm *NR  <2.0          Right Radial Anti Sensory (Base 1st Digit)  31.4C  Wrist    2.4 <3.1 21.7  Wrist Base 1st Digit 2.4 0.0    Right Ulnar Anti Sensory (5th Digit)  32.1C  Wrist    3.9 <4.0 21.4 >15.0 Wrist 5th Digit 3.9 14.0 *36 >38   Motor Summary Table   Stim Site NR Onset (ms) Norm Onset (ms) O-P Amp (mV) Norm O-P Amp Site1 Site2 Delta-0 (ms) Dist (cm) Vel (m/s) Norm  Vel (m/s)  Right Median Motor (Abd Poll Brev)  31.1C  Wrist    *5.2 <4.2 5.5 >5 Elbow Wrist 4.3 18.0 *42 >50  Elbow    9.5  4.7         Right Ulnar Motor (Abd Dig Min)  31.1C  Wrist    3.1 <4.2 8.0 >3 B Elbow Wrist 3.3 18.0 55 >53  B Elbow    6.4  7.5  A Elbow B Elbow 1.5 10.0 67 >53  A Elbow    7.9  7.6          EMG   Side Muscle Nerve Root Ins Act Fibs Psw Amp Dur Poly Recrt Int Fraser Din Comment  Right Abd Poll Brev Median C8-T1 Nml Nml Nml Nml Nml 0 Nml Nml   Right 1stDorInt Ulnar C8-T1 Nml Nml Nml Nml Nml 0 Nml Nml   Right PronatorTeres Median C6-7 Nml Nml Nml Nml Nml 0 Nml Nml   Right Biceps Musculocut C5-6 Nml Nml Nml Nml Nml 0 Nml Nml   Right Deltoid Axillary C5-6 Nml Nml Nml Nml Nml 0 Nml Nml     Nerve Conduction Studies Anti Sensory Left/Right Comparison   Stim Site L Lat (ms) R Lat (ms) L-R Lat (ms) L Amp (V) R Amp (V) L-R Amp (%) Site1 Site2 L Vel (m/s) R Vel (m/s) L-R Vel (m/s)  Median Acr Palm Anti Sensory (2nd Digit)  31.7C  Wrist  *4.8  33.9  Wrist Palm     Palm             Radial Anti Sensory (Base 1st Digit)  31.4C  Wrist  2.4   21.7  Wrist Base 1st Digit     Ulnar Anti Sensory (5th Digit)  32.1C  Wrist  3.9   21.4  Wrist 5th Digit  *36    Motor Left/Right Comparison   Stim Site L Lat (ms) R Lat (ms) L-R Lat (ms) L Amp (mV) R Amp (mV) L-R Amp (%) Site1 Site2 L Vel (m/s) R Vel (m/s) L-R Vel (m/s)  Median Motor (Abd Poll Brev)  31.1C  Wrist  *5.2   5.5  Elbow Wrist  *42   Elbow  9.5   4.7        Ulnar Motor (Abd Dig Min)  31.1C  Wrist  3.1   8.0  B Elbow Wrist  55   B Elbow  6.4   7.5  A Elbow B Elbow  67   A Elbow  7.9   7.6           Waveforms:

## 2017-12-24 ENCOUNTER — Ambulatory Visit: Payer: Self-pay | Admitting: Family Medicine

## 2017-12-24 ENCOUNTER — Ambulatory Visit (INDEPENDENT_AMBULATORY_CARE_PROVIDER_SITE_OTHER): Payer: 59 | Admitting: Psychology

## 2017-12-24 ENCOUNTER — Ambulatory Visit: Payer: Self-pay | Admitting: Psychology

## 2017-12-24 DIAGNOSIS — F4310 Post-traumatic stress disorder, unspecified: Secondary | ICD-10-CM | POA: Diagnosis not present

## 2017-12-25 ENCOUNTER — Ambulatory Visit (INDEPENDENT_AMBULATORY_CARE_PROVIDER_SITE_OTHER): Payer: Managed Care, Other (non HMO) | Admitting: Orthopaedic Surgery

## 2017-12-25 ENCOUNTER — Telehealth: Payer: Self-pay | Admitting: *Deleted

## 2017-12-25 NOTE — Telephone Encounter (Addendum)
Called and spoke with the patient. Gave her the information for the new patient appt on March 20th at 10:15am. Gave the instructions for her to arrive at 10am, bring a list of her medicines with her and that she will have a pelvic exam. Also advised her that we have the [place] service, and will call her if we have a cancellation.   Called, left Stanton Kidney at physicians for [employer] a message with the appt information and that I have contacted the patient.   Verified the demographics and insurance

## 2017-12-26 ENCOUNTER — Encounter: Payer: Self-pay | Admitting: Family Medicine

## 2017-12-26 ENCOUNTER — Ambulatory Visit: Payer: Self-pay | Admitting: Family Medicine

## 2017-12-27 ENCOUNTER — Ambulatory Visit: Payer: Self-pay | Admitting: Psychology

## 2017-12-27 ENCOUNTER — Ambulatory Visit: Payer: 59 | Admitting: Psychology

## 2017-12-28 ENCOUNTER — Other Ambulatory Visit (INDEPENDENT_AMBULATORY_CARE_PROVIDER_SITE_OTHER): Payer: Self-pay | Admitting: Orthopaedic Surgery

## 2017-12-31 ENCOUNTER — Ambulatory Visit (INDEPENDENT_AMBULATORY_CARE_PROVIDER_SITE_OTHER): Payer: Managed Care, Other (non HMO) | Admitting: Orthopaedic Surgery

## 2017-12-31 ENCOUNTER — Ambulatory Visit (INDEPENDENT_AMBULATORY_CARE_PROVIDER_SITE_OTHER): Payer: Managed Care, Other (non HMO) | Admitting: Family Medicine

## 2017-12-31 ENCOUNTER — Ambulatory Visit: Payer: Self-pay | Admitting: Psychology

## 2017-12-31 ENCOUNTER — Other Ambulatory Visit: Payer: Self-pay | Admitting: Family Medicine

## 2017-12-31 ENCOUNTER — Encounter: Payer: Self-pay | Admitting: Family Medicine

## 2017-12-31 ENCOUNTER — Encounter (INDEPENDENT_AMBULATORY_CARE_PROVIDER_SITE_OTHER): Payer: Self-pay | Admitting: Orthopaedic Surgery

## 2017-12-31 DIAGNOSIS — G5601 Carpal tunnel syndrome, right upper limb: Secondary | ICD-10-CM | POA: Insufficient documentation

## 2017-12-31 DIAGNOSIS — M542 Cervicalgia: Secondary | ICD-10-CM

## 2017-12-31 MED ORDER — TIZANIDINE HCL 2 MG PO CAPS: 2.0000 mg | ORAL_CAPSULE | Freq: Two times a day (BID) | ORAL | 1 refills | Status: DC | PRN

## 2017-12-31 NOTE — Progress Notes (Signed)
Patient: Miranda [name]           Date of Birth: [birth year]           MRN: [id number] Visit Date: 12/31/2017 PCP: Laurey Morale, MD   Assessment & Plan:  Chief Complaint:  Chief Complaint  Patient presents with  . Right Shoulder - Pain, Follow-up  . Left Shoulder - Pain, Follow-up   Visit Diagnoses:  1. Carpal tunnel syndrome, right upper limb     Plan: Raena comes in today to discuss nerve conduction studies of her right upper extremity.  Nerve conduction studies from 12/18/2017 revealed moderate compression median nerve.  Khelani continues to have some pain and numbness to the right hand although this is much improved.  We have increased her dose of gabapentin and she has been in outpatient physical therapy.  She does note a recent fall to her right elbow for which she was seen by another provider.  CT scan was obtained which showed no acute findings.  She now plans on resuming her physical therapy.  She thinks this is helping her overall shoulder, neck and arm pain.  If her carpal tunnel syndrome gets bad enough she will follow-up with [place] for cortisone injection.  Otherwise follow-up with [place] on as-needed basis.  Follow-Up Instructions: Return if symptoms worsen or fail to improve.   Orders:  No orders of the defined types were placed in this encounter.  No orders of the defined types were placed in this encounter.   Imaging: No results found.  PMFS History: Patient Active Problem List   Diagnosis Date Noted  . Carpal tunnel syndrome, right upper limb 12/31/2017  . Elbow injury, right, initial encounter 12/16/2017  . Chronic pain of both shoulders 11/30/2017  . Type 2 diabetes mellitus without complications ([place]) 32/35/5732  . PTSD (post-traumatic stress disorder) 10/25/2016  . 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 Diabetes mellitus ([place]) 10/25/2016  . Major depressive disorder, recurrent episode, severe, with psychosis ([place]) 10/20/2016  . Major  depressive disorder, recurrent episode, severe, 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[place]) 10/19/2016  . TIA (transient ischemic attack) 05/16/2016  . Arterial ischemic stroke, MCA, left, acute ([place]) 03/29/2016  . Plantar fasciitis 08/08/2013  . Obesity, Class III, BMI 40-49.9 (morbid obesity) ([place]) 04/09/2013  . Unspecified sleep apnea 03/31/2013  . PCO (polycystic ovaries) 04/11/2012  . NECK PAIN 11/21/2010  . FATTY LIVER DISEASE 01/26/2010  . NAUSEA WITH VOMITING 01/26/2010  . ABDOMINAL PAIN, EPIGASTRIC 01/26/2010  . PORTAL HYPERTENSION 01/26/2010  . ABDOMINAL PAIN, RIGHT UPPER QUADRANT 12/27/2009  . Migraine headache 06/28/2009  . ACUTE BRONCHITIS 06/08/2009  . ABDOMINAL PAIN, RIGHT LOWER QUADRANT 11/24/2008  . BREAST MASS 08/21/2008  . KNEE PAIN 04/14/2008  . Hypothyroidism 03/17/2008  . Hyperlipidemia 03/17/2008  . DEPRESSION 03/17/2008  . NEUROPATHY 03/17/2008  . Essential hypertension 03/17/2008  . ASTHMA 03/17/2008  . GERD 03/17/2008  . Headache 03/17/2008   Past Medical History:  Diagnosis Date  . Anxiety   . Arthritis   . Asthma   . Bowel obstruction ([place])   . Cardiac arrhythmia   . Depression    sees Dr. Matilde Haymaker in [place]   . Diabetes mellitus    type 2, on insulin pump, sees Dr. Loanne Drilling   . GERD (gastroesophageal reflux disease)   . Hyperlipidemia   . Hypertension   . Hypothyroidism    sees Dr. Elyse Hsu  . Insomnia   . Migraine syndrome   . Morbid obesity ([place])   .  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 Neck pain   . Neuropathy associated with endocrine disorder ([place])   . Sleep apnea with use of continuous positive airway pressure (CPAP)    Sleep apnea is resolved due to weight loss. 08/2017  . Stroke [facility]) 03-25-16   left MCA   . Stroke [facility]) 04/2016    Family History  Adopted: Yes  Problem Relation Age of Onset  . Heart disease Unknown        on both sides of family  . Diabetes Unknown        on both sides of family    Past Surgical History:    Procedure Laterality Date  . blocked itestinal repair  61   age 47 months  . BREAST BIOPSY Left 2017  . CHOLECYSTECTOMY  2011  . INDUCED ABORTION  1996   forced abortion  . INGUINAL HERNIA REPAIR Left 1980   age 48  . LAPAROSCOPIC ENDOMETRIOSIS FULGURATION  1998  . WISDOM TOOTH EXTRACTION  2007   Social History   Occupational History  . Occupation: Nurse, learning disability  Tobacco Use  . Smoking status: Current Every Day Smoker    Packs/day: 0.00    Types: Cigarettes  . Smokeless tobacco: Never Used  . Tobacco comment: quit 03/25/2016  Substance and Sexual Activity  . Alcohol use: No    Alcohol/week: 0.0 oz  . Drug use: Yes    Types: Marijuana    Comment: 2/10 last marijuana use  . Sexual activity: Not on file

## 2017-12-31 NOTE — Patient Instructions (Signed)
Your exam and MRI are reassuring. I would not recommend carpal tunnel release based on your exam and the distribution of your symptoms. I would still use the wrist braces at night and as often as possible during the day. Start physical therapy for your neck/upper back as you're having spasms of your trapezius, rhomboid muscles. Topamax can cause tingling into the fingers but I would not recommend you stop this. Follow up with me in 6 weeks for reevaluation.

## 2018-01-01 ENCOUNTER — Encounter: Payer: Self-pay | Admitting: Family Medicine

## 2018-01-01 ENCOUNTER — Ambulatory Visit (INDEPENDENT_AMBULATORY_CARE_PROVIDER_SITE_OTHER): Payer: 59 | Admitting: Psychology

## 2018-01-01 DIAGNOSIS — F4310 Post-traumatic stress disorder, unspecified: Secondary | ICD-10-CM | POA: Diagnosis not present

## 2018-01-01 NOTE — Assessment & Plan Note (Signed)
independently reviewed her MRI and no concerning findings to account for her pain.  Reviewed NCV results showing moderate right carpal tunnel.  She's not describing carpal tunnel as primary issue however and this wouldn't account for her neck pain into shoulders.  Also with numbness primarily in an ulnar distribution.  Exam is reassuring.  Advised against surgical intervention at this time.  Continue with wrist braces at night, as often as possible during the day.  Start physical therapy for spasms in bilateral trapezius, upper back muscles.  She did describe tingling into finger as well - advised common with hyperventilation, possibility related to topamax but advised to continue this medication.  Tylenol if needed.  Continue gabapentin.  F/u in 6 weeks.

## 2018-01-01 NOTE — Progress Notes (Signed)
PCP: Laurey Morale, MD  Subjective:   HPI: Patient is a 42 y.o. female here for bilateral shoulder, neck pain.  Patient reports about 4 months ago she started to get pain in left shoulder. Was referred to orthopedics, evaluated, sent for physical therapy for cervical radiculopathy. MRI was reassuring but nerve conduction studies showed carpal tunnel so advised to wear wrist braces. She reports having pain posterior neck into both shoulders. Pain worse with turning head to the side, especially to the right. Pain level 4/10. Continues doing physical therapy at [employer]. Associated numbness into both hands but more into the right, especially into 4th and 5th digits.  Sometimes into 1st, 2nd digits. No bowel/bladder dysfunction.  Past Medical History:  Diagnosis Date  . Anxiety   . Arthritis   . Asthma   . Bowel obstruction ([place])   . Cardiac arrhythmia   . Depression    sees Dr. Matilde Haymaker in [place]   . Diabetes mellitus    type 2, on insulin pump, sees Dr. Loanne Drilling   . GERD (gastroesophageal reflux disease)   . Hyperlipidemia   . Hypertension   . Hypothyroidism    sees Dr. Elyse Hsu  . Insomnia   . Migraine syndrome   . Morbid obesity ([place])   . 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 Neck pain   . Neuropathy associated with endocrine disorder ([place])   . Sleep apnea with use of continuous positive airway pressure (CPAP)    Sleep apnea is resolved due to weight loss. 08/2017  . 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 Stroke [facility]) 04/2016    Current Outpatient Medications on File Prior to Visit  Medication Sig Dispense Refill  . albuterol (PROAIR HFA) 108 (90 Base) MCG/ACT inhaler Inhale 2 puffs into the lungs every 4 (four) hours as needed for wheezing. 8.5 g 11  . ARIPiprazole (ABILIFY) 15 MG tablet Take 15 mg by mouth daily.    . budesonide-formoterol (SYMBICORT) 160-4.5 MCG/ACT inhaler Inhale 2 puffs into the lungs 2 (two) times daily. 1 Inhaler 11  . buPROPion (WELLBUTRIN  XL) 300 MG 24 hr tablet Take 1 tablet (300 mg total) by mouth daily. 30 tablet 0  . clopidogrel (PLAVIX) 75 MG tablet Take 1 tablet (75 mg total) by mouth daily. 90 tablet 3  . elvitegravir-cobicistat-emtricitabine-tenofovir (GENVOYA) 150-150-200-10 MG TABS tablet Take 1 tablet by mouth daily. 5 tablet 0  . ferrous sulfate (FER-IN-SOL) 75 (15 Fe) MG/ML SOLN Take by mouth.    . gabapentin (NEURONTIN) 100 MG capsule Take two capsules po tid 90 capsule 3  . hydrOXYzine (ATARAX/VISTARIL) 50 MG tablet Take 50 mg by mouth. Take 1 tablet every night and may take 3 other times as needed    . Insulin Human (INSULIN PUMP) SOLN Inject into the skin. novolog    . levothyroxine (SYNTHROID, LEVOTHROID) 125 MCG tablet Take 1 tablet (125 mcg total) by mouth daily before breakfast. 90 tablet 3  . metFORMIN (GLUCOPHAGE) 1000 MG tablet Take 1 tablet (1,000 mg total) by mouth 2 (two) times daily with a meal. 60 tablet 0  . metoprolol succinate (TOPROL-XL) 25 MG 24 hr tablet Take 0.5 tablets (12.5 mg total) by mouth daily. 30 tablet 0  . Multiple Vitamin (MULTIVITAMIN) tablet Take 1 tablet by mouth daily.    Marland Kitchen oxyCODONE-acetaminophen (PERCOCET/ROXICET) 5-325 MG tablet Take 1 tablet by mouth every 6 (six) hours as needed for severe pain. 20 tablet 0  . pioglitazone (ACTOS) 45 MG tablet Take  1 tablet (45 mg total) by mouth daily. 30 tablet 0  . prazosin (MINIPRESS) 5 MG capsule Take 10 mg by mouth at bedtime.    . promethazine (PHENERGAN) 25 MG tablet Take 1 tablet (25 mg total) every 6 (six) hours as needed by mouth for nausea or vomiting. 10 tablet 0  . rizatriptan (MAXALT) 10 MG tablet Take 1 tablet (10 mg total) by mouth as needed for migraine. May repeat in 2 hours if needed 30 tablet 5  . rosuvastatin (CRESTOR) 20 MG tablet TAKE 1 TABLET(20 MG) BY MOUTH DAILY (Patient not taking: Reported on 12/19/2017) 30 tablet 11  . sertraline (ZOLOFT) 100 MG tablet Take 200 mg by mouth daily.    Marland Kitchen topiramate (TOPAMAX) 100 MG  tablet Take 1 tablet (100 mg total) by mouth 2 (two) times daily. 60 tablet 5  . traZODone (DESYREL) 100 MG tablet Take 1 tablet (100 mg total) by mouth at bedtime. (Patient taking differently: Take 150 mg by mouth at bedtime. ) 30 tablet 0   No current facility-administered medications on file prior to visit.     Past Surgical History:  Procedure Laterality Date  . blocked itestinal repair  20   age 12 months  . BREAST BIOPSY Left 2017  . CHOLECYSTECTOMY  2011  . INDUCED ABORTION  1996   forced abortion  . INGUINAL HERNIA REPAIR Left 1980   age 82  . LAPAROSCOPIC ENDOMETRIOSIS FULGURATION  1998  . WISDOM TOOTH EXTRACTION  2007    Allergies  Allergen Reactions  . Macrobid [Nitrofurantoin] Hives  . Byetta 10 Mcg Pen [Exenatide] Hives  . Clindamycin/Lincomycin Nausea And Vomiting  . Geodon [Ziprasidone Hcl]   . Lipitor [Atorvastatin]     Per patient this causes cramps  . Macrobid [Nitrofurantoin Macrocrystal]   . Nsaids     Upset stomach   . Penicillins Other (See Comments)    Has patient had a PCN reaction causing immediate rash, facial/tongue/throat swelling, SOB or lightheadedness with hypotension: No Has patient had a PCN reaction causing severe rash involving mucus membranes or skin necrosis: No Has patient had a PCN reaction that required hospitalization No Has patient had a PCN reaction occurring within the last 10 years: No If all of the above answers are "NO", then may proceed with Cephalosporin use.   Marland Kitchen Avocado Diarrhea and Other (See Comments)    Severe cramping, sweats, and diarrhea in upper GI/stomach  . Tramadol Hcl Itching and Rash    Social History   Socioeconomic History  . Marital status: Divorced    Spouse name: Not on file  . Number of children: 5  . Years of education: Not on file  . Highest education level: Not on file  Social Needs  . Financial resource strain: Not on file  . Food insecurity - worry: Not on file  . Food insecurity -  inability: Not on file  . Transportation needs - medical: Not on file  . Transportation needs - non-medical: Not on file  Occupational History  . Occupation: Nurse, learning disability  Tobacco Use  . Smoking status: Current Every Day Smoker    Packs/day: 0.00    Types: Cigarettes  . Smokeless tobacco: Never Used  . Tobacco comment: quit 03/25/2016  Substance and Sexual Activity  . Alcohol use: No    Alcohol/week: 0.0 oz  . Drug use: Yes    Types: Marijuana    Comment: 2/10 last marijuana use  . Sexual activity: Not on file  Other  Topics Concern  . Not on file  Social History Narrative  . Not on file    Family History  Adopted: Yes  Problem Relation Age of Onset  . Heart disease Unknown        on both sides of family  . Diabetes Unknown        on both sides of family    BP 96/66   Pulse 94   Ht 5\' 2"  (1.575 m)   Wt 188 lb (85.3 kg)   BMI 34.39 kg/m   Review of Systems: See HPI above.     Objective:  Physical Exam:  Gen: NAD, comfortable in exam room  Neck: No gross deformity, swelling, bruising. TTP bilateral paraspinal cervical regions.  No midline/bony TTP. ROM limited to 10 degrees flexion, 5 extension, 20 right lateral rotation, full left lateral rotation. BUE strength 5/5.   Sensation intact to light touch.   2+ equal reflexes in triceps, biceps, brachioradialis tendons. Negative spurlings. NV intact distal BUEs.  Right shoulder: No swelling, ecchymoses.  No gross deformity. No TTP. FROM. Negative Hawkins, Neers. Strength 5/5 with empty can and resisted internal/external rotation. NV intact distally.  Left shoulder: No swelling, ecchymoses.  No gross deformity. No TTP. FROM. Negative Hawkins, Neers. Strength 5/5 with empty can and resisted internal/external rotation. NV intact distally.   Assessment & Plan:  1. Neck, bilateral shoulder pain - independently reviewed her MRI and no concerning findings to account for her pain.  Reviewed  NCV results showing moderate right carpal tunnel.  She's not describing carpal tunnel as primary issue however and this wouldn't account for her neck pain into shoulders.  Also with numbness primarily in an ulnar distribution.  Exam is reassuring.  Advised against surgical intervention at this time.  Continue with wrist braces at night, as often as possible during the day.  Start physical therapy for spasms in bilateral trapezius, upper back muscles.  She did describe tingling into finger as well - advised common with hyperventilation, possibility related to topamax but advised to continue this medication.  Tylenol if needed.  Continue gabapentin.  F/u in 6 weeks.

## 2018-01-03 ENCOUNTER — Other Ambulatory Visit: Payer: Self-pay | Admitting: Obstetrics and Gynecology

## 2018-01-03 ENCOUNTER — Ambulatory Visit: Payer: Self-pay | Admitting: Psychology

## 2018-01-03 DIAGNOSIS — D171 Benign lipomatous neoplasm of skin and subcutaneous tissue of trunk: Secondary | ICD-10-CM

## 2018-01-07 ENCOUNTER — Ambulatory Visit: Payer: 59 | Admitting: Psychology

## 2018-01-08 ENCOUNTER — Ambulatory Visit (INDEPENDENT_AMBULATORY_CARE_PROVIDER_SITE_OTHER): Payer: 59 | Admitting: Psychology

## 2018-01-08 DIAGNOSIS — F4481 Dissociative identity disorder: Secondary | ICD-10-CM

## 2018-01-08 DIAGNOSIS — F4312 Post-traumatic stress disorder, chronic: Secondary | ICD-10-CM

## 2018-01-10 ENCOUNTER — Ambulatory Visit
Admission: RE | Admit: 2018-01-10 | Discharge: 2018-01-10 | Disposition: A | Discharge status: Home / Self Care | Payer: Managed Care, Other (non HMO) | Source: Ambulatory Visit | Attending: Obstetrics and Gynecology | Admitting: Obstetrics and Gynecology

## 2018-01-10 ENCOUNTER — Ambulatory Visit: Payer: Self-pay

## 2018-01-10 ENCOUNTER — Ambulatory Visit (INDEPENDENT_AMBULATORY_CARE_PROVIDER_SITE_OTHER): Payer: 59 | Admitting: Psychology

## 2018-01-10 ENCOUNTER — Other Ambulatory Visit: Payer: Self-pay | Admitting: Obstetrics and Gynecology

## 2018-01-10 DIAGNOSIS — F4481 Dissociative identity disorder: Secondary | ICD-10-CM | POA: Diagnosis not present

## 2018-01-10 DIAGNOSIS — D171 Benign lipomatous neoplasm of skin and subcutaneous tissue of trunk: Secondary | ICD-10-CM

## 2018-01-10 DIAGNOSIS — F4312 Post-traumatic stress disorder, chronic: Secondary | ICD-10-CM

## 2018-01-11 ENCOUNTER — Encounter: Payer: Self-pay | Admitting: Family Medicine

## 2018-01-14 ENCOUNTER — Ambulatory Visit (INDEPENDENT_AMBULATORY_CARE_PROVIDER_SITE_OTHER): Payer: 59 | Admitting: Psychology

## 2018-01-14 ENCOUNTER — Telehealth: Payer: Self-pay | Admitting: *Deleted

## 2018-01-14 DIAGNOSIS — F4312 Post-traumatic stress disorder, chronic: Secondary | ICD-10-CM | POA: Diagnosis not present

## 2018-01-14 DIAGNOSIS — F4481 Dissociative identity disorder: Secondary | ICD-10-CM

## 2018-01-15 ENCOUNTER — Telehealth: Payer: Self-pay | Admitting: *Deleted

## 2018-01-15 NOTE — Telephone Encounter (Signed)
Returned the patient's call and gave the time for appt tomorrow. Advised the patient that " We are aware that Dr. Julien Girt asked for Dr. Denman George to preform a colposcopy. We do have the equipment here to do the procedure, but can't  guarantee that she will to the procedure." patient verbalized understanding.

## 2018-01-16 ENCOUNTER — Ambulatory Visit (INDEPENDENT_AMBULATORY_CARE_PROVIDER_SITE_OTHER): Payer: 59 | Admitting: Psychology

## 2018-01-16 ENCOUNTER — Encounter: Payer: Self-pay | Admitting: Gynecologic Oncology

## 2018-01-16 ENCOUNTER — Inpatient Hospital Stay: Payer: Managed Care, Other (non HMO) | Attending: Gynecologic Oncology | Admitting: Gynecologic Oncology

## 2018-01-16 VITALS — BP 98/58 | HR 58 | Temp 98.0°F | Resp 18 | Wt 190.1 lb

## 2018-01-16 DIAGNOSIS — R87612 Low grade squamous intraepithelial lesion on cytologic smear of cervix (LGSIL): Secondary | ICD-10-CM | POA: Diagnosis not present

## 2018-01-16 DIAGNOSIS — F4481 Dissociative identity disorder: Secondary | ICD-10-CM | POA: Diagnosis not present

## 2018-01-16 DIAGNOSIS — Z79899 Other long term (current) drug therapy: Secondary | ICD-10-CM | POA: Diagnosis not present

## 2018-01-16 DIAGNOSIS — F17210 Nicotine dependence, cigarettes, uncomplicated: Secondary | ICD-10-CM | POA: Diagnosis not present

## 2018-01-16 DIAGNOSIS — F4312 Post-traumatic stress disorder, chronic: Secondary | ICD-10-CM | POA: Diagnosis not present

## 2018-01-16 DIAGNOSIS — D071 Carcinoma in situ of vulva: Secondary | ICD-10-CM | POA: Diagnosis not present

## 2018-01-16 DIAGNOSIS — B977 Papillomavirus as the cause of diseases classified elsewhere: Secondary | ICD-10-CM | POA: Insufficient documentation

## 2018-01-16 NOTE — Progress Notes (Signed)
Consult Note: Gyn-Onc  Consult was requested by Dr. Julien Girt for the evaluation of Miranda [name] 42 y.o. female  CC:  Chief Complaint  Patient presents with  . VIN III (vulvar intraepithelial neoplasia III)  . LGSIL pap    Assessment/Plan:  Miranda [name]  is a 42 y.o.  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  There is no residual VIN 3 on the vulva on today's exam. Recommend re-inspection in 6 months. Counseled to call if new symptoms develop before then.  Will follow-up today's colposcopic biopsies - if CIN 2 or 3, would recommend excisional procedure. If benign or CIN 1 I recommend follow-up pap in 1 year in accordance with ASCCP guidelines.   HPI: Miranda [name] is a 42 year old woman who is seen in consultation at the request of Dr Julien Girt for VIN 3 and LGSIL pap smear.  The patient has a remote history of abnormal Pap smears for which she has had colposcopy and biopsies but never procedure on the cervix.  She was evaluated in September 2018 for sensation of a bump on the vulva perineum.  At this time (07/05/2017) she was noted to have very small 2-3 mm lesions at the perineum that were consistent with skin tags.  They were infiltrated with local anesthetic and excised with a scalpel.  Final pathology revealed VIN 3.  She was seen back again for evaluation in March 2019 and on January 03, 2018 underwent a Pap test.  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  The patient has a history of 3 strokes and is on Plavix.  She is on no medication presently for her diabetes mellitus as weight loss is significantly improved her hyperglycemia.  She is an active smoker.   Current Meds:  Outpatient Encounter Medications as of 01/16/2018  Medication Sig  . albuterol (PROAIR HFA) 108 (90 Base) MCG/ACT inhaler Inhale 2 puffs into the lungs every 4 (four) hours as needed for wheezing.  . ARIPiprazole (ABILIFY) 15  MG tablet Take 15 mg by mouth daily.  . budesonide-formoterol (SYMBICORT) 160-4.5 MCG/ACT inhaler Inhale 2 puffs into the lungs 2 (two) times daily.  Marland Kitchen buPROPion (WELLBUTRIN XL) 300 MG 24 hr tablet Take 1 tablet (300 mg total) by mouth daily.  . clopidogrel (PLAVIX) 75 MG tablet Take 1 tablet (75 mg total) by mouth daily.  . ferrous sulfate (FER-IN-SOL) 75 (15 Fe) MG/ML SOLN Take by mouth.  . gabapentin (NEURONTIN) 100 MG capsule Take two capsules po tid  . hydrOXYzine (ATARAX/VISTARIL) 50 MG tablet Take 50 mg by mouth. Take 1 tablet every night and may take 3 other times as needed  . levothyroxine (SYNTHROID, LEVOTHROID) 125 MCG tablet Take 1 tablet (125 mcg total) by mouth daily before breakfast.  . metoprolol succinate (TOPROL-XL) 25 MG 24 hr tablet Take 0.5 tablets (12.5 mg total) by mouth daily.  . Multiple Vitamin (MULTIVITAMIN) tablet Take 1 tablet by mouth daily.  Marland Kitchen oseltamivir (TAMIFLU) 75 MG capsule   . oxyCODONE-acetaminophen (PERCOCET/ROXICET) 5-325 MG tablet Take 1 tablet by mouth every 6 (six) hours as needed for severe pain.  . pioglitazone (ACTOS) 45 MG tablet Take 1 tablet (45 mg total) by mouth daily.  . prazosin (MINIPRESS) 5 MG capsule Take 10 mg by mouth at bedtime.  . promethazine (PHENERGAN) 25 MG tablet Take 1 tablet (25 mg total) every 6 (six) hours as needed by mouth for nausea or  vomiting.  . rizatriptan (MAXALT) 10 MG tablet Take 1 tablet (10 mg total) by mouth as needed for migraine. May repeat in 2 hours if needed  . rosuvastatin (CRESTOR) 20 MG tablet TAKE 1 TABLET(20 MG) BY MOUTH DAILY  . sertraline (ZOLOFT) 100 MG tablet Take 200 mg by mouth daily.  . tizanidine (ZANAFLEX) 2 MG capsule Take 1 capsule (2 mg total) by mouth 2 (two) times daily as needed for muscle spasms.  Marland Kitchen topiramate (TOPAMAX) 100 MG tablet Take 1 tablet (100 mg total) by mouth 2 (two) times daily.  . traZODone (DESYREL) 150 MG tablet Take by mouth at bedtime.  . [DISCONTINUED]  elvitegravir-cobicistat-emtricitabine-tenofovir (GENVOYA) 150-150-200-10 MG TABS tablet Take 1 tablet by mouth daily.  . [DISCONTINUED] Insulin Human (INSULIN PUMP) SOLN Inject into the skin. novolog  . [DISCONTINUED] metFORMIN (GLUCOPHAGE) 1000 MG tablet Take 1 tablet (1,000 mg total) by mouth 2 (two) times daily with a meal.  . [DISCONTINUED] traZODone (DESYREL) 100 MG tablet Take 1 tablet (100 mg total) by mouth at bedtime. (Patient taking differently: Take 150 mg by mouth at bedtime. )   No facility-administered encounter medications on file as of 01/16/2018.     Allergy:  Allergies  Allergen Reactions  . Macrobid [Nitrofurantoin] Hives  . Byetta 10 Mcg Pen [Exenatide] Hives  . Clindamycin/Lincomycin Nausea And Vomiting  . Geodon [Ziprasidone Hcl]   . Lipitor [Atorvastatin]     Per patient this causes cramps  . Macrobid [Nitrofurantoin Macrocrystal]   . Nsaids     Upset stomach   . Penicillins Other (See Comments)    Has patient had a PCN reaction causing immediate rash, facial/tongue/throat swelling, SOB or lightheadedness with hypotension: No Has patient had a PCN reaction causing severe rash involving mucus membranes or skin necrosis: No Has patient had a PCN reaction that required hospitalization No Has patient had a PCN reaction occurring within the last 10 years: No If all of the above answers are "NO", then may proceed with Cephalosporin use.   Marland Kitchen Avocado Diarrhea and Other (See Comments)    Severe cramping, sweats, and diarrhea in upper GI/stomach  . Tramadol Hcl Itching and Rash    Social Hx:   Social History   Socioeconomic History  . Marital status: Divorced    Spouse name: Not on file  . Number of children: 5  . Years of education: Not on file  . Highest education level: Not on file  Social Needs  . Financial resource strain: Not on file  . Food insecurity - worry: Not on file  . Food insecurity - inability: Not on file  . Transportation needs - medical: Not  on file  . Transportation needs - non-medical: Not on file  Occupational History  . Occupation: Nurse, learning disability  Tobacco Use  . Smoking status: Current Every Day Smoker    Packs/day: 0.50    Years: 0.50    Pack years: 0.25    Types: Cigarettes  . Smokeless tobacco: Never Used  . Tobacco comment: quit 03/25/2016  Substance and Sexual Activity  . Alcohol use: No    Alcohol/week: 0.0 oz  . Drug use: Yes    Types: Marijuana    Comment: 2/10 last marijuana use  . Sexual activity: Not on file  Other Topics Concern  . Not on file  Social History Narrative  . Not on file    Past Surgical Hx:  Past Surgical History:  Procedure Laterality Date  . blocked itestinal repair  [id number]  age 10 months  . BREAST BIOPSY Left 2017  . CHOLECYSTECTOMY  2011  . INDUCED ABORTION  1996   forced abortion  . INGUINAL HERNIA REPAIR Left 1980   age 42  . LAPAROSCOPIC ENDOMETRIOSIS FULGURATION  1998  . WISDOM TOOTH EXTRACTION  2007    Past Medical Hx:  Past Medical History:  Diagnosis Date  . Anxiety   . Arthritis   . Asthma   . Bowel obstruction ([place])   . Cardiac arrhythmia   . Depression    sees Dr. Matilde Haymaker in [place]   . Diabetes mellitus    type 2, on insulin pump, sees Dr. Loanne Drilling   . GERD (gastroesophageal reflux disease)   . Hyperlipidemia   . Hypertension   . Hypothyroidism    sees Dr. Elyse Hsu  . Insomnia   . Migraine syndrome   . Morbid obesity ([place])   . 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 Neck pain   . Neuropathy associated with endocrine disorder ([place])   . Sleep apnea with use of continuous positive airway pressure (CPAP)    Sleep apnea is resolved due to weight loss. 08/2017  . 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 Stroke [facility]) 04/2016    Past Gynecological History:  See HPI Patient's last menstrual period was 12/25/2017.  Family Hx:  Family History  Adopted: Yes  Problem Relation Age of Onset  . Heart disease Unknown        on both sides of  family  . Diabetes Unknown        on both sides of family    Review of Systems:  Constitutional  Feels well,    ENT Normal appearing ears and nares bilaterally Skin/Breast  No rash, sores, jaundice, itching, dryness Cardiovascular  No chest pain, shortness of breath, or edema  Pulmonary  No cough or wheeze.  Gastro Intestinal  No nausea, vomitting, or diarrhoea. No bright red blood per rectum, no abdominal pain, change in bowel movement, or constipation.  Genito Urinary  No frequency, urgency, dysuria, no itch/bleeding, lesions Musculo Skeletal  No myalgia, arthralgia, joint swelling or pain  Neurologic  No weakness, numbness, change in gait,  Psychology  No depression, anxiety, insomnia.   Vitals:  Blood pressure (!) 98/58, pulse (!) 58, temperature 98 F (36.7 C), temperature source Oral, resp. rate 18, weight 190 lb 1.6 oz (86.2 kg), last menstrual period 12/25/2017, SpO2 100 %.  Physical Exam: WD in NAD Neck  Supple NROM, without any enlargements.  Lymph Node Survey No cervical supraclavicular or inguinal adenopathy Cardiovascular  Pulse normal rate, regularity and rhythm. S1 and S2 normal.  Lungs  Clear to auscultation bilateraly, without wheezes/crackles/rhonchi. Good air movement.  Skin  No rash/lesions/breakdown  Psychiatry  Alert and oriented to person, place, and time  Abdomen  Normoactive bowel sounds, abdomen soft, non-tender and obese without evidence of hernia.  Back No CVA tenderness Genito Urinary  Vulva/vagina: Normal external female genitalia.  No lesions. No discharge or bleeding. Acetic acid applied and no residual lesions seen on vulva.  Bladder/urethra:  No lesions or masses, well supported bladder  Vagina: normal  Cervix: Normal appearing, no lesions.  Uterus:  Small, mobile, no parametrial involvement or nodularity.  Adnexa: no palpable masses. Rectal  deferred Extremities  No bilateral cyanosis, clubbing or edema.  PROCEDURE NOTE:  colposcopy Preop Dx: LGSIL pap with positive HR HPV Postop Dx: same, impression: low grade dysplasia Surgeon: Dorann Ou Procedure: colposcopy of vagina and  cervix with biopsies EBL: <5WS Complications: none Specimens: cervix at 9 o'clock, ECC Procedure: Verbal consent was obtained including discussion regarding the potential cost implications for colposcopy during this encounter.  Consent was obtained.  Verbal timeout was performed.  The patient was placed in lithotomy and a speculum was inserted into the vagina.  The entirety of the vagina and cervix were inspected.  4% acetic acid was applied to the cervix.  The transformation zone was identified entirely.  There was some acetowhite changes measuring approximately 5 mm at 9:00 of the ectocervix.  No abnormal vasculature cobblestoning or apparent high-grade dysplastic changes invasive malignancy was identified visually.  A biopsy was taken from the ectocervix at 9:00.  An endocervical curettage was then obtained.  Both specimens were sent separately for histopathology.  Hemostasis was obtained at the biopsy site using silver nitrate.  The patient tolerated the procedure well.   Thereasa Solo, MD  01/16/2018, 11:07 AM

## 2018-01-16 NOTE — Patient Instructions (Signed)
Dr Serita Grit office will contact you with the results from your biopsies.  Please call her office at [phone] to schedule an appointment in September, 2019 to inspect the vulva as there is a risk that the dysplasia can return.   Contact her office sooner if you develop a new itch or bump that does not go away.

## 2018-01-17 ENCOUNTER — Ambulatory Visit (INDEPENDENT_AMBULATORY_CARE_PROVIDER_SITE_OTHER): Payer: 59 | Admitting: Psychology

## 2018-01-17 DIAGNOSIS — F4481 Dissociative identity disorder: Secondary | ICD-10-CM | POA: Diagnosis not present

## 2018-01-17 DIAGNOSIS — F4312 Post-traumatic stress disorder, chronic: Secondary | ICD-10-CM | POA: Diagnosis not present

## 2018-01-21 ENCOUNTER — Ambulatory Visit: Payer: 59 | Admitting: Psychology

## 2018-01-21 ENCOUNTER — Telehealth: Payer: Self-pay | Admitting: *Deleted

## 2018-01-21 ENCOUNTER — Telehealth: Payer: Self-pay

## 2018-01-21 NOTE — Telephone Encounter (Signed)
Returned a call to [employer] from [facility] for [employer] office Dr. Julien Girt. Left a message that Dr. Denman George did preform a colposcopy with biopsies in the office

## 2018-01-21 NOTE — Telephone Encounter (Signed)
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  Thereasa Solo, MD

## 2018-01-21 NOTE — Telephone Encounter (Signed)
Told Miranda [name] the results of her cervical bx as noted below by Dr. Denman George.

## 2018-01-22 ENCOUNTER — Ambulatory Visit (INDEPENDENT_AMBULATORY_CARE_PROVIDER_SITE_OTHER): Payer: 59 | Admitting: Psychology

## 2018-01-22 DIAGNOSIS — F4312 Post-traumatic stress disorder, chronic: Secondary | ICD-10-CM

## 2018-01-22 DIAGNOSIS — F4481 Dissociative identity disorder: Secondary | ICD-10-CM

## 2018-01-24 ENCOUNTER — Ambulatory Visit (INDEPENDENT_AMBULATORY_CARE_PROVIDER_SITE_OTHER): Payer: 59 | Admitting: Psychology

## 2018-01-24 DIAGNOSIS — F4312 Post-traumatic stress disorder, chronic: Secondary | ICD-10-CM | POA: Diagnosis not present

## 2018-01-24 DIAGNOSIS — F4481 Dissociative identity disorder: Secondary | ICD-10-CM | POA: Diagnosis not present

## 2018-01-25 ENCOUNTER — Telehealth: Payer: Self-pay | Admitting: Family Medicine

## 2018-01-25 NOTE — Telephone Encounter (Signed)
I looked and actually was written for an extra refill when we prescribed this on 3/4 so they should already have one for her.

## 2018-01-25 NOTE — Telephone Encounter (Signed)
She could take this twice a day for about a week then go back to taking as needed.

## 2018-01-25 NOTE — Telephone Encounter (Signed)
Patient was informed. States she does need a refill then. Pharmacy: [facility] on [address] in [place]

## 2018-01-25 NOTE — Telephone Encounter (Signed)
Patient states she was in physical therapy yesterday and they said her back was really tight Patient has been taking the Tizanidine only as needed and wants to know if she should start back on taking medication twice a day or once a day regularly

## 2018-01-28 ENCOUNTER — Ambulatory Visit: Payer: 59 | Admitting: Psychology

## 2018-01-28 NOTE — Telephone Encounter (Signed)
Patient was informed.

## 2018-01-29 ENCOUNTER — Ambulatory Visit (INDEPENDENT_AMBULATORY_CARE_PROVIDER_SITE_OTHER): Payer: 59 | Admitting: Psychology

## 2018-01-29 DIAGNOSIS — F4481 Dissociative identity disorder: Secondary | ICD-10-CM

## 2018-01-29 DIAGNOSIS — F4312 Post-traumatic stress disorder, chronic: Secondary | ICD-10-CM | POA: Diagnosis not present

## 2018-01-30 ENCOUNTER — Ambulatory Visit (INDEPENDENT_AMBULATORY_CARE_PROVIDER_SITE_OTHER): Payer: Managed Care, Other (non HMO) | Admitting: Family Medicine

## 2018-01-30 ENCOUNTER — Encounter: Payer: Self-pay | Admitting: Family Medicine

## 2018-01-30 VITALS — BP 124/78 | HR 79 | Temp 98.7°F | Ht 62.0 in | Wt 193.4 lb

## 2018-01-30 DIAGNOSIS — E878 Other disorders of electrolyte and fluid balance, not elsewhere classified: Secondary | ICD-10-CM | POA: Diagnosis not present

## 2018-01-30 DIAGNOSIS — B029 Zoster without complications: Secondary | ICD-10-CM

## 2018-01-30 DIAGNOSIS — L509 Urticaria, unspecified: Secondary | ICD-10-CM

## 2018-01-30 MED ORDER — METHYLPREDNISOLONE 4 MG PO TBPK
ORAL_TABLET | ORAL | 0 refills | Status: DC
Start: 2018-01-30 — End: 2018-02-04

## 2018-01-30 MED ORDER — VALACYCLOVIR HCL 1 G PO TABS: 1000.0000 mg | ORAL_TABLET | Freq: Three times a day (TID) | ORAL | 0 refills | Status: DC

## 2018-01-30 NOTE — Progress Notes (Signed)
   Subjective:    Patient ID: Miranda [name], female    DOB: [birth year], 42 y.o.   MRN: [id number]  HPI Here for several issues. First she has had a rash on the left flank for about 5 days. It itches and slightly burns. Also she has been itching all over her body for several days. She has frequent hives and this reminds her of that, although she has no visible urticaria. Also she recently saw Dr. Buddy Duty for her diabetes and labs revealed a slightly low CO2 level at 21. He told her to ask [place] about this. Her creatinine was normal.    Review of Systems  Constitutional: Negative.   Respiratory: Negative.   Cardiovascular: Negative.   Skin: Positive for rash.       Objective:   Physical Exam  Constitutional: She appears well-developed and well-nourished.  Cardiovascular: Normal rate, regular rhythm, normal heart sounds and intact distal pulses.  Pulmonary/Chest: Effort normal and breath sounds normal. No respiratory distress. She has no wheezes. She has no rales.  Skin:  There is an area of macular erythematous spots, some of which are excoriated, on the left flank. The rest of her skin is clear.           Assessment & Plan:  She has an area of shingles on the flank, and this has triggered a bout of generalized hives. Treat both with a course of Valtrex and a Medrol dose pack. I told her not to worry about the CO2 level for now, but we will continue to follow this. Alysia Penna, MD

## 2018-01-31 ENCOUNTER — Ambulatory Visit (INDEPENDENT_AMBULATORY_CARE_PROVIDER_SITE_OTHER): Payer: 59 | Admitting: Psychology

## 2018-01-31 ENCOUNTER — Ambulatory Visit: Payer: Self-pay | Admitting: *Deleted

## 2018-01-31 DIAGNOSIS — F4312 Post-traumatic stress disorder, chronic: Secondary | ICD-10-CM

## 2018-01-31 DIAGNOSIS — F4481 Dissociative identity disorder: Secondary | ICD-10-CM | POA: Diagnosis not present

## 2018-01-31 NOTE — Telephone Encounter (Signed)
I spoke with patient and would like to get a work note for tonight please?

## 2018-01-31 NOTE — Telephone Encounter (Signed)
Pt was seen Yesterday by Dr Sarajane Jews for  Shingles  On Left trunk was rx  meds and  Is  Taking This  afternoon noticed two new areas  L elbow  Triangle   Of 3   Dots    Triangular  In appearance  About the  Size of a  Dime  L wrist   Linear  Line  Of  approx  1  Inch  Also  Reports  Smaller bumps  Starting to  Develop  On r anterior   Of the  l  Arm    Pt  Reports  She  Works  At  [employer]   She  Is  Asking   If it  Is  Still Ok  To  Go  To  Work  Tonight   Dr Sarajane Jews  Had  Advised  It  Was  Ok to  Work  With the  [employer]  But  She now  Has  New  Areas    Was  Not there  Yesterday  At  Ov   [phone]   [phone]

## 2018-02-01 NOTE — Telephone Encounter (Signed)
Sent to PCP ?

## 2018-02-02 ENCOUNTER — Other Ambulatory Visit: Payer: Self-pay

## 2018-02-02 ENCOUNTER — Encounter (HOSPITAL_BASED_OUTPATIENT_CLINIC_OR_DEPARTMENT_OTHER): Payer: Self-pay | Admitting: Emergency Medicine

## 2018-02-02 ENCOUNTER — Emergency Department (HOSPITAL_BASED_OUTPATIENT_CLINIC_OR_DEPARTMENT_OTHER)
Admission: EM | Admit: 2018-02-02 | Discharge: 2018-02-02 | Disposition: A | Discharge status: Home / Self Care | Payer: Managed Care, Other (non HMO) | Attending: Emergency Medicine | Admitting: Emergency Medicine

## 2018-02-02 DIAGNOSIS — Z7984 Long term (current) use of oral hypoglycemic drugs: Secondary | ICD-10-CM | POA: Diagnosis not present

## 2018-02-02 DIAGNOSIS — J45909 Unspecified asthma, uncomplicated: Secondary | ICD-10-CM | POA: Insufficient documentation

## 2018-02-02 DIAGNOSIS — E119 Type 2 diabetes mellitus without complications: Secondary | ICD-10-CM | POA: Insufficient documentation

## 2018-02-02 DIAGNOSIS — Z79899 Other long term (current) drug therapy: Secondary | ICD-10-CM | POA: Insufficient documentation

## 2018-02-02 DIAGNOSIS — R21 Rash and other nonspecific skin eruption: Secondary | ICD-10-CM | POA: Insufficient documentation

## 2018-02-02 DIAGNOSIS — I10 Essential (primary) hypertension: Secondary | ICD-10-CM | POA: Diagnosis not present

## 2018-02-02 DIAGNOSIS — Z7902 Long term (current) use of antithrombotics/antiplatelets: Secondary | ICD-10-CM | POA: Diagnosis not present

## 2018-02-02 DIAGNOSIS — E039 Hypothyroidism, unspecified: Secondary | ICD-10-CM | POA: Insufficient documentation

## 2018-02-02 DIAGNOSIS — F17210 Nicotine dependence, cigarettes, uncomplicated: Secondary | ICD-10-CM | POA: Diagnosis not present

## 2018-02-02 NOTE — Discharge Instructions (Signed)
Please keep taking your medications from Dr. Sarajane Jews.  Please schedule a follow up appointment on Monday.  If the rash worsens, you start feeling unwell, you develop fevers or have any other concerns please seek additional medical care.  Please take pictures of your rash to document it.

## 2018-02-02 NOTE — ED Provider Notes (Signed)
[facility] EMERGENCY DEPARTMENT Provider Note   CSN: [id number] Arrival date & time: 02/02/18  1827     History   Chief Complaint Chief Complaint  Patient presents with  . Rash    HPI Miranda [name] is a 42 y.o. female with a history of stroke, diabetes, hypertension, hyperlipidemia, hypothyroid, depression, multiple personality disorder, who presents today for his evaluation of a rash.  She was diagnosed 3 days ago with shingles on her abdomen.  She was started on a Medrol Dosepak and Valtrex which she reports compliance with.  She reports that 2 days ago she noticed she had one spot on her hand, one spot on her left thigh, and 2 spots on her face.  She reports the ones on her face are better.  The new spots are not painful unless touched.  Her shingles on her stomach remains painful.   HPI  Past Medical History:  Diagnosis Date  . Anxiety   . Arthritis   . Asthma   . Bowel obstruction ([place])   . Cardiac arrhythmia   . Depression    sees Dr. Matilde Haymaker in [place]   . Diabetes mellitus    type 2, on insulin pump, sees Dr. Loanne Drilling   . GERD (gastroesophageal reflux disease)   . Hyperlipidemia   . Hypertension   . Hypothyroidism    sees Dr. Elyse Hsu  . Insomnia   . Migraine syndrome   . Morbid obesity ([place])   . Multiple personality ([place])   . Neck pain   . Neuropathy associated with endocrine disorder ([place])   . Sleep apnea with use of continuous positive airway pressure (CPAP)    Sleep apnea is resolved due to weight loss. 08/2017  . Stroke [facility]) 03-25-16   left MCA   . Stroke [facility]) 04/2016    Patient Active Problem List   Diagnosis Date Noted  . Carpal tunnel syndrome, right upper limb 12/31/2017  . Elbow injury, right, initial encounter 12/16/2017  . Chronic pain of both shoulders 11/30/2017  . Type 2 diabetes mellitus without complications ([place]) 62/83/1517  . PTSD (post-traumatic stress disorder) 10/25/2016  . 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 Diabetes mellitus ([place]) 10/25/2016  . Major depressive disorder, recurrent episode, severe, with psychosis ([place]) 10/20/2016  . Major depressive disorder, recurrent episode, severe, 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[place]) 10/19/2016  . TIA (transient ischemic attack) 05/16/2016  . Arterial ischemic stroke, MCA, left, acute ([place]) 03/29/2016  . Plantar fasciitis 08/08/2013  . Obesity, Class III, BMI 40-49.9 (morbid obesity) ([place]) 04/09/2013  . Unspecified sleep apnea 03/31/2013  . PCO (polycystic ovaries) 04/11/2012  . Neck pain 11/21/2010  . FATTY LIVER DISEASE 01/26/2010  . NAUSEA WITH VOMITING 01/26/2010  . ABDOMINAL PAIN, EPIGASTRIC 01/26/2010  . PORTAL HYPERTENSION 01/26/2010  . ABDOMINAL PAIN, RIGHT UPPER QUADRANT 12/27/2009  . Migraine headache 06/28/2009  . ACUTE BRONCHITIS 06/08/2009  . ABDOMINAL PAIN, RIGHT LOWER QUADRANT 11/24/2008  . BREAST MASS 08/21/2008  . KNEE PAIN 04/14/2008  . Hypothyroidism 03/17/2008  . Hyperlipidemia 03/17/2008  . DEPRESSION 03/17/2008  . NEUROPATHY 03/17/2008  . Essential hypertension 03/17/2008  . ASTHMA 03/17/2008  . GERD 03/17/2008  . Headache 03/17/2008    Past Surgical History:  Procedure Laterality Date  . blocked itestinal repair  77   age 48 months  . BREAST BIOPSY Left 2017  . CHOLECYSTECTOMY  2011  . INDUCED ABORTION  1996   forced abortion  . INGUINAL HERNIA REPAIR Left 1980   age  3  . LAPAROSCOPIC ENDOMETRIOSIS FULGURATION  1998  . WISDOM TOOTH EXTRACTION  2007     OB History    Gravida  5   Para  3   Term  1   Preterm  2   AB  2   Living  0     SAB  1   TAB  1   Ectopic  0   Multiple  1   Live Births  4            Home Medications    Prior to Admission medications   Medication Sig Start Date End Date Taking? Authorizing Provider  albuterol (PROAIR HFA) 108 (90 Base) MCG/ACT inhaler Inhale 2 puffs into the lungs every 4 (four) hours as needed for wheezing. 07/24/17   Laurey Morale, MD  ARIPiprazole (ABILIFY) 15 MG tablet Take 15 mg by mouth daily.    [provider]  budesonide-formoterol (SYMBICORT) 160-4.5 MCG/ACT inhaler Inhale 2 puffs into the lungs 2 (two) times daily. 07/24/17   Laurey Morale, MD  buPROPion (WELLBUTRIN XL) 300 MG 24 hr tablet Take 1 tablet (300 mg total) by mouth daily. 10/28/16   Kerrie Buffalo, NP  clopidogrel (PLAVIX) 75 MG tablet Take 1 tablet (75 mg total) by mouth daily. 07/09/17   Laurey Morale, MD  ferrous sulfate (FER-IN-SOL) 75 (15 Fe) MG/ML SOLN Take by mouth.    [provider]  gabapentin (NEURONTIN) 100 MG capsule Take two capsules po tid 11/30/17   Aundra Dubin, PA-C  hydrOXYzine (ATARAX/VISTARIL) 50 MG tablet Take 50 mg by mouth. Take 1 tablet every night and may take 3 other times as needed    [provider]  levothyroxine (SYNTHROID, LEVOTHROID) 125 MCG tablet Take 1 tablet (125 mcg total) by mouth daily before breakfast. 09/26/17   Laurey Morale, MD  methylPREDNISolone (MEDROL DOSEPAK) 4 MG TBPK tablet As directed 01/30/18   Laurey Morale, MD  metoprolol succinate (TOPROL-XL) 25 MG 24 hr tablet Take 0.5 tablets (12.5 mg total) by mouth daily. 10/28/16   Kerrie Buffalo, NP  Multiple Vitamin (MULTIVITAMIN) tablet Take 1 tablet by mouth daily.    [provider]  oxyCODONE-acetaminophen (PERCOCET/ROXICET) 5-325 MG tablet Take 1 tablet by mouth every 6 (six) hours as needed for severe pain. 12/13/17   Hudnall, Sharyn Lull, MD  pioglitazone (ACTOS) 45 MG tablet Take 1 tablet (45 mg total) by mouth daily. 10/28/16   Kerrie Buffalo, NP  prazosin (MINIPRESS) 5 MG capsule Take 10 mg by mouth at bedtime.    [provider]  promethazine (PHENERGAN) 25 MG tablet Take 1 tablet (25 mg total) every 6 (six) hours as needed by mouth for nausea or vomiting. 09/15/17   Lawyer, Harrell Gave, PA-C  rizatriptan (MAXALT) 10 MG tablet Take 1 tablet (10 mg total) by mouth as needed for migraine. May repeat in 2  hours if needed 01/04/17   Laurey Morale, MD  rosuvastatin (CRESTOR) 20 MG tablet TAKE 1 TABLET(20 MG) BY MOUTH DAILY 08/24/17   Laurey Morale, MD  sertraline (ZOLOFT) 100 MG tablet Take 200 mg by mouth daily.    [provider]  tizanidine (ZANAFLEX) 2 MG capsule Take 1 capsule (2 mg total) by mouth 2 (two) times daily as needed for muscle spasms. 12/31/17   Hudnall, Sharyn Lull, MD  topiramate (TOPAMAX) 100 MG tablet Take 1 tablet (100 mg total) by mouth 2 (two) times daily. 01/14/16   Laurey Morale,  MD  traZODone (DESYREL) 150 MG tablet Take by mouth at bedtime.    [provider]  valACYclovir (VALTREX) 1000 MG tablet Take 1 tablet (1,000 mg total) by mouth 3 (three) times daily. 01/30/18   Laurey Morale, MD    Family History Family History  Adopted: Yes  Problem Relation Age of Onset  . Heart disease Unknown        on both sides of family  . Diabetes Unknown        on both sides of family    Social History Social History   Tobacco Use  . Smoking status: Current Every Day Smoker    Packs/day: 0.50    Years: 0.50    Pack years: 0.25    Types: Cigarettes  . Smokeless tobacco: Never Used  . Tobacco comment: quit 03/25/2016  Substance Use Topics  . Alcohol use: No    Alcohol/week: 0.0 oz  . Drug use: Yes    Types: Marijuana    Comment: 2/10 last marijuana use     Allergies   Macrobid [nitrofurantoin]; Byetta 10 mcg pen [exenatide]; Clindamycin/lincomycin; Geodon [ziprasidone hcl]; Lipitor [atorvastatin]; Macrobid [nitrofurantoin macrocrystal]; Nsaids; Penicillins; Avocado; and Tramadol hcl   Review of Systems Review of Systems  Constitutional: Negative for chills and fever.  Eyes: Negative for pain and visual disturbance.  Musculoskeletal: Negative for neck pain and neck stiffness.  Skin: Positive for wound.  Allergic/Immunologic: Negative for immunocompromised state.  Neurological: Negative for headaches.  All other systems reviewed and are  negative.    Physical Exam Updated Vital Signs BP 125/67 (BP Location: Left Arm)   Pulse 72   Temp 98.2 F (36.8 C) (Oral)   Resp 18   Ht 5\' 2"  (1.575 m)   Wt 87.5 kg (193 lb)   LMP 01/23/2018   SpO2 97%   BMI 35.30 kg/m   Physical Exam  Constitutional: She is oriented to person, place, and time. She appears well-developed and well-nourished.  HENT:  Head: Normocephalic and atraumatic.  Right Ear: Tympanic membrane, external ear and ear canal normal.  Left Ear: External ear normal.  No obvious herpetic type lesions on chin or face.  Left TM partially obscured by wax, visualized portion is normal pearly gray.    No skin lesions in bilateral ear canals.   Eyes: Conjunctivae are normal. Right eye exhibits no discharge. Left eye exhibits no discharge.  Neck: Normal range of motion and full passive range of motion without pain. Neck supple.  Cardiovascular: Normal rate.  Neurological: She is alert and oriented to person, place, and time.  Skin: She is not diaphoretic.  There is an area of multiple wounds/red patches on the left side of patient's flank.  There is one small lesion on her left hand and one lesion on her left anterior thigh.  No obvious secondary infection.  Please see clinical images.   Nursing note and vitals reviewed.          ED Treatments / Results  Labs (all labs ordered are listed, but only abnormal results are displayed) Labs Reviewed - No data to display  EKG None  Radiology No results found.  Procedures Procedures (including critical care time)  Medications Ordered in ED Medications - No data to display   Initial Impression / Assessment and Plan / ED Course  I have reviewed the triage vital signs and the nursing notes.  Pertinent labs & imaging results that were available during my care of the patient were reviewed  by me and considered in my medical decision making (see chart for details).    Miranda [name] presents today for  evaluation of rash.  She was seen by her primary care provider on 02/26/18 and diagnosed with shingles, she was told to seek additional medical evaluation if she developed new spots outside of her torso.  The lesions that she is concerned about today have been present and unchanged for 2 days.  She reports compliance with her Valtrex.  She is generally well-appearing no fevers, hemodynamically stable, no confusion.  New spots appear nonspecific, do not appear vesicular in nature or consistent with herpetic lesions.  New spots are also only painful with palpation, if they were shingles I would expect that they would be painful even without touch.  Patient was given strict return precautions and states her understanding.  Patient discharged home with PCP follow-up on Monday.  Final Clinical Impressions(s) / ED Diagnoses   Final diagnoses:  Rash    ED Discharge Orders    None       Ollen Gross 02/02/18 2010    Charlesetta Shanks, MD 02/08/18 1526

## 2018-02-02 NOTE — ED Notes (Signed)
Pt given d/c instructions as per chart. Verbalizes understanding. No questions. 

## 2018-02-02 NOTE — ED Triage Notes (Signed)
Pt is being treated for shingles on her abd. Pt states she has a new itchy red rash today to L arm and chin.

## 2018-02-02 NOTE — ED Notes (Signed)
Rash to left side under breast and to face x 1 week. +itching and weeping per pt

## 2018-02-04 ENCOUNTER — Encounter: Payer: Self-pay | Admitting: Family Medicine

## 2018-02-04 ENCOUNTER — Ambulatory Visit (INDEPENDENT_AMBULATORY_CARE_PROVIDER_SITE_OTHER): Payer: Managed Care, Other (non HMO) | Admitting: Family Medicine

## 2018-02-04 ENCOUNTER — Ambulatory Visit (INDEPENDENT_AMBULATORY_CARE_PROVIDER_SITE_OTHER): Payer: 59 | Admitting: Psychology

## 2018-02-04 VITALS — BP 112/70 | HR 89 | Temp 98.8°F | Ht 62.0 in | Wt 189.4 lb

## 2018-02-04 DIAGNOSIS — B029 Zoster without complications: Secondary | ICD-10-CM

## 2018-02-04 DIAGNOSIS — F4481 Dissociative identity disorder: Secondary | ICD-10-CM | POA: Diagnosis not present

## 2018-02-04 DIAGNOSIS — R21 Rash and other nonspecific skin eruption: Secondary | ICD-10-CM | POA: Diagnosis not present

## 2018-02-04 DIAGNOSIS — F4312 Post-traumatic stress disorder, chronic: Secondary | ICD-10-CM | POA: Diagnosis not present

## 2018-02-04 NOTE — Telephone Encounter (Signed)
Please give her a work note for the night specified that she missed

## 2018-02-04 NOTE — Progress Notes (Signed)
   Subjective:    Patient ID: Miranda [name], female    DOB: 1976/01/08, 42 y.o.   MRN: [id number]  HPI Here to follow up shingles and an ER visit on 02-02-18. We saw her last week for a painful rash on the abdomen that was diagnosed as shingles. She is finishing up a course of Valtrex and steroids, and this rash is drying up. However over the weekend she began to get some itchy bumps on the left arm, left thigh, and face. These are fading away at this point. At the ER she was told this was not disseminated shingles but no specific diagnosis was offered.    Review of Systems  Constitutional: Negative.   Respiratory: Negative.   Cardiovascular: Negative.   Skin: Positive for rash.  Neurological: Negative.        Objective:   Physical Exam  Constitutional: She appears well-developed and well-nourished.  Cardiovascular: Normal rate, regular rhythm, normal heart sounds and intact distal pulses.  Pulmonary/Chest: Effort normal and breath sounds normal. No respiratory distress. She has no wheezes. She exhibits no tenderness.  Skin:  There is a patch of red lesions on the left abdomen consistent with shingles. The other red papular lesions appeared to be dure to a form of acne.           Assessment & Plan:  Her shingles is resolving so she will finish the Valtrex. The other spots appear to be a form of acne, which may be the result of the stress on her system from the shingles. These should fade away on their own, but she can use Hydroxyzine every 4 hours prn.  Alysia Penna, MD

## 2018-02-04 NOTE — Telephone Encounter (Signed)
Called and spoke with pt. Pt stated that she needed the following date to be excused from work 02/01/2018,46/2019, and 02/04/2018. Pt stated that she is going to work Tuesday 02/05/2018.   Pt asked for [place] to fax the work note to [employer] fax number [phone]  Note has been faxed.

## 2018-02-07 ENCOUNTER — Ambulatory Visit (INDEPENDENT_AMBULATORY_CARE_PROVIDER_SITE_OTHER): Payer: 59 | Admitting: Psychology

## 2018-02-07 DIAGNOSIS — F4312 Post-traumatic stress disorder, chronic: Secondary | ICD-10-CM | POA: Diagnosis not present

## 2018-02-07 DIAGNOSIS — F4481 Dissociative identity disorder: Secondary | ICD-10-CM | POA: Diagnosis not present

## 2018-02-11 ENCOUNTER — Ambulatory Visit: Payer: Managed Care, Other (non HMO) | Admitting: Family Medicine

## 2018-02-11 ENCOUNTER — Ambulatory Visit (INDEPENDENT_AMBULATORY_CARE_PROVIDER_SITE_OTHER): Payer: 59 | Admitting: Psychology

## 2018-02-11 DIAGNOSIS — F4481 Dissociative identity disorder: Secondary | ICD-10-CM

## 2018-02-11 DIAGNOSIS — F4312 Post-traumatic stress disorder, chronic: Secondary | ICD-10-CM

## 2018-02-13 ENCOUNTER — Ambulatory Visit (INDEPENDENT_AMBULATORY_CARE_PROVIDER_SITE_OTHER): Payer: Managed Care, Other (non HMO) | Admitting: Family Medicine

## 2018-02-13 VITALS — BP 93/67 | Ht 61.5 in | Wt 185.0 lb

## 2018-02-13 DIAGNOSIS — G8929 Other chronic pain: Secondary | ICD-10-CM

## 2018-02-13 DIAGNOSIS — M542 Cervicalgia: Secondary | ICD-10-CM | POA: Diagnosis not present

## 2018-02-13 DIAGNOSIS — M25511 Pain in right shoulder: Secondary | ICD-10-CM | POA: Diagnosis not present

## 2018-02-13 DIAGNOSIS — M25512 Pain in left shoulder: Secondary | ICD-10-CM

## 2018-02-13 NOTE — Patient Instructions (Signed)
You're still dealing with the spasms/strain of your neck and upper back muscles (trapezius, rhomboids). We will refer you to [facility] to see if this is cheaper for you to do therapy. Do home exercises and stretches on days you don't go. Try the tizanidine three times a day - let me know when you've almost run out and we can send a higher amount in to cover for a whole month. Consider increasing the gabapentin in the future. Follow up with me in 6 weeks.

## 2018-02-14 ENCOUNTER — Ambulatory Visit (INDEPENDENT_AMBULATORY_CARE_PROVIDER_SITE_OTHER): Payer: 59 | Admitting: Psychology

## 2018-02-14 DIAGNOSIS — F4481 Dissociative identity disorder: Secondary | ICD-10-CM | POA: Diagnosis not present

## 2018-02-14 DIAGNOSIS — F4312 Post-traumatic stress disorder, chronic: Secondary | ICD-10-CM | POA: Diagnosis not present

## 2018-02-15 ENCOUNTER — Encounter: Payer: Self-pay | Admitting: Family Medicine

## 2018-02-15 NOTE — Progress Notes (Signed)
PCP: Laurey Morale, MD  Subjective:   HPI: Patient is a 42 y.o. female here for bilateral shoulder, neck pain.  3/4: Patient reports about 4 months ago she started to get pain in left shoulder. Was referred to orthopedics, evaluated, sent for physical therapy for cervical radiculopathy. MRI was reassuring but nerve conduction studies showed carpal tunnel so advised to wear wrist braces. She reports having pain posterior neck into both shoulders. Pain worse with turning head to the side, especially to the right. Pain level 4/10. Continues doing physical therapy at [employer]. Associated numbness into both hands but more into the right, especially into 4th and 5th digits.  Sometimes into 1st, 2nd digits. No bowel/bladder dysfunction.  4/17: Patient reports overall she's doing well. 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 Physical therapy visits are very expensive so interested in trying a different location that will take her insurance. Doing home exercises. Shoulders feel tight posteriorly. Taking tizanidine twice a day with gabapentin 200 tid. Pain also worse with prolonged standing and sitting. No skin changes.  Past Medical History:  Diagnosis Date  . Anxiety   . Arthritis   . Asthma   . Bowel obstruction ([place])   . Cardiac arrhythmia   . Depression    sees Dr. Matilde Haymaker in [place]   . Diabetes mellitus    type 2, on insulin pump, sees Dr. Loanne Drilling   . GERD (gastroesophageal reflux disease)   . Hyperlipidemia   . Hypertension   . Hypothyroidism    sees Dr. Elyse Hsu  . Insomnia   . Migraine syndrome   . Morbid obesity ([place])   . Multiple personality ([place])   . Neck pain   . Neuropathy associated with endocrine disorder ([place])   . Sleep apnea with use of continuous positive airway pressure (CPAP)    Sleep apnea is resolved due to weight loss. 08/2017  . Stroke [facility]) 03-25-16   left MCA   . Stroke [facility]) 04/2016     Current Outpatient Medications on File Prior to Visit  Medication Sig Dispense Refill  . albuterol (PROAIR HFA) 108 (90 Base) MCG/ACT inhaler Inhale 2 puffs into the lungs every 4 (four) hours as needed for wheezing. 8.5 g 11  . ARIPiprazole (ABILIFY) 15 MG tablet Take 15 mg by mouth daily.    . budesonide-formoterol (SYMBICORT) 160-4.5 MCG/ACT inhaler Inhale 2 puffs into the lungs 2 (two) times daily. 1 Inhaler 11  . buPROPion (WELLBUTRIN XL) 300 MG 24 hr tablet Take 1 tablet (300 mg total) by mouth daily. 30 tablet 0  . clopidogrel (PLAVIX) 75 MG tablet Take 1 tablet (75 mg total) by mouth daily. 90 tablet 3  . ferrous sulfate (FER-IN-SOL) 75 (15 Fe) MG/ML SOLN Take by mouth.    . gabapentin (NEURONTIN) 100 MG capsule Take two capsules po tid 90 capsule 3  . hydrOXYzine (ATARAX/VISTARIL) 50 MG tablet Take 50 mg by mouth. Take 1 tablet every night and may take 3 other times as needed    . levothyroxine (SYNTHROID, LEVOTHROID) 125 MCG tablet Take 1 tablet (125 mcg total) by mouth daily before breakfast. 90 tablet 3  . metoprolol succinate (TOPROL-XL) 25 MG 24 hr tablet Take 0.5 tablets (12.5 mg total) by mouth daily. 30 tablet 0  . Multiple Vitamin (MULTIVITAMIN) tablet Take 1 tablet by mouth daily.    . norethindrone (MICRONOR,CAMILA,ERRIN) 0.35 MG tablet     . oxyCODONE-acetaminophen (PERCOCET/ROXICET) 5-325 MG tablet Take  1 tablet by mouth every 6 (six) hours as needed for severe pain. 20 tablet 0  . pioglitazone (ACTOS) 45 MG tablet Take 1 tablet (45 mg total) by mouth daily. 30 tablet 0  . prazosin (MINIPRESS) 5 MG capsule Take 10 mg by mouth at bedtime.    . promethazine (PHENERGAN) 25 MG tablet Take 1 tablet (25 mg total) every 6 (six) hours as needed by mouth for nausea or vomiting. 10 tablet 0  . rizatriptan (MAXALT) 10 MG tablet Take 1 tablet (10 mg total) by mouth as needed for migraine. May repeat in 2 hours if needed 30 tablet 5  . rosuvastatin (CRESTOR) 20 MG tablet TAKE 1  TABLET(20 MG) BY MOUTH DAILY 30 tablet 11  . sertraline (ZOLOFT) 100 MG tablet Take 200 mg by mouth daily.    . tizanidine (ZANAFLEX) 2 MG capsule Take 1 capsule (2 mg total) by mouth 2 (two) times daily as needed for muscle spasms. 60 capsule 1  . topiramate (TOPAMAX) 100 MG tablet     . traZODone (DESYREL) 150 MG tablet Take by mouth at bedtime.    . valACYclovir (VALTREX) 1000 MG tablet Take 1 tablet (1,000 mg total) by mouth 3 (three) times daily. 21 tablet 0   No current facility-administered medications on file prior to visit.     Past Surgical History:  Procedure Laterality Date  . blocked itestinal repair  63   age 61 months  . BREAST BIOPSY Left 2017  . CHOLECYSTECTOMY  2011  . INDUCED ABORTION  1996   forced abortion  . INGUINAL HERNIA REPAIR Left 1980   age 72  . LAPAROSCOPIC ENDOMETRIOSIS FULGURATION  1998  . WISDOM TOOTH EXTRACTION  2007    Allergies  Allergen Reactions  . Macrobid [Nitrofurantoin] Hives  . Byetta 10 Mcg Pen [Exenatide] Hives  . Clindamycin/Lincomycin Nausea And Vomiting  . Geodon [Ziprasidone Hcl]   . Lipitor [Atorvastatin]     Per patient this causes cramps  . Macrobid [Nitrofurantoin Macrocrystal]   . Nsaids     Upset stomach   . Penicillins Other (See Comments)    Has patient had a PCN reaction causing immediate rash, facial/tongue/throat swelling, SOB or lightheadedness with hypotension: No Has patient had a PCN reaction causing severe rash involving mucus membranes or skin necrosis: No Has patient had a PCN reaction that required hospitalization No Has patient had a PCN reaction occurring within the last 10 years: No If all of the above answers are "NO", then may proceed with Cephalosporin use.   Marland Kitchen Avocado Diarrhea and Other (See Comments)    Severe cramping, sweats, and diarrhea in upper GI/stomach  . Tramadol Hcl Itching and Rash    Social History   Socioeconomic History  . Marital status: Divorced    Spouse name: Not on file   . Number of children: 5  . Years of education: Not on file  . Highest education level: Not on file  Occupational History  . Occupation: Nurse, learning disability  Social Needs  . Financial resource strain: Not on file  . Food insecurity:    Worry: Not on file    Inability: Not on file  . Transportation needs:    Medical: Not on file    Non-medical: Not on file  Tobacco Use  . Smoking status: Current Every Day Smoker    Packs/day: 0.50    Years: 0.50    Pack years: 0.25    Types: Cigarettes  . Smokeless tobacco: Never Used  .  Tobacco comment: quit 03/25/2016  Substance and Sexual Activity  . Alcohol use: No    Alcohol/week: 0.0 oz  . Drug use: Yes    Types: Marijuana    Comment: 2/10 last marijuana use  . Sexual activity: Not on file  Lifestyle  . Physical activity:    Days per week: Not on file    Minutes per session: Not on file  . Stress: Not on file  Relationships  . Social connections:    Talks on phone: Not on file    Gets together: Not on file    Attends religious service: Not on file    Active member of club or organization: Not on file    Attends meetings of clubs or organizations: Not on file    Relationship status: Not on file  . Intimate partner violence:    Fear of current or ex partner: Not on file    Emotionally abused: Not on file    Physically abused: Not on file    Forced sexual activity: Not on file  Other Topics Concern  . Not on file  Social History Narrative  . Not on file    Family History  Adopted: Yes  Problem Relation Age of Onset  . Heart disease Unknown        on both sides of family  . Diabetes Unknown        on both sides of family    BP 93/67   Ht 5' 1.5" (1.562 m)   Wt 185 lb (83.9 kg)   LMP 01/23/2018   BMI 34.39 kg/m   Review of Systems: See HPI above.     Objective:  Physical Exam:  Gen: NAD, comfortable in exam room  Neck: No gross deformity, swelling, bruising. TTP bilateral paraspinal cervical  regions and medial to scapulae.  No midline/bony TTP. FROM with pain on flexion and extension. BUE strength 5/5.   Sensation intact to light touch.   2+ equal reflexes in triceps, biceps, brachioradialis tendons. Negative spurlings. NV intact distal BUEs.  Bilateral shoulders: No swelling, ecchymoses.  No gross deformity. No TTP. FROM. Negative Hawkins, Neers. Strength 5/5 with empty can and resisted internal/external rotation. NV intact distally.   Assessment & Plan:  1. Neck, bilateral shoulder pain - MRI was reassuring.  NCV results showed moderate right carpal tunnel but also don't explain her neck/posterior shoulder pain.  Continues with spasms/strain of upper back muscles, trapezius, scapular stabilizers.  Will refer to different PT group that will hopefully take her insurance.  Continue home exercises/stretches.  Try tizanidine three times a day.  Continue gabapentin but could increase this in the future.  F/u in 6 weeks.

## 2018-02-15 NOTE — Assessment & Plan Note (Signed)
MRI was reassuring.  NCV results showed moderate right carpal tunnel but also don't explain her neck/posterior shoulder pain.  Continues with spasms/strain of upper back muscles, trapezius, scapular stabilizers.  Will refer to different PT group that will hopefully take her insurance.  Continue home exercises/stretches.  Try tizanidine three times a day.  Continue gabapentin but could increase this in the future.  F/u in 6 weeks.

## 2018-02-18 ENCOUNTER — Telehealth: Payer: Self-pay | Admitting: Family Medicine

## 2018-02-18 ENCOUNTER — Ambulatory Visit: Payer: 59 | Admitting: Psychology

## 2018-02-18 ENCOUNTER — Encounter: Payer: Self-pay | Admitting: Family Medicine

## 2018-02-18 ENCOUNTER — Ambulatory Visit (INDEPENDENT_AMBULATORY_CARE_PROVIDER_SITE_OTHER): Payer: Managed Care, Other (non HMO) | Admitting: Family Medicine

## 2018-02-18 VITALS — BP 122/64 | HR 86 | Temp 99.0°F | Ht 61.5 in | Wt 193.2 lb

## 2018-02-18 DIAGNOSIS — J019 Acute sinusitis, unspecified: Secondary | ICD-10-CM

## 2018-02-18 MED ORDER — AZITHROMYCIN 250 MG PO TABS: ORAL_TABLET | ORAL | 0 refills | Status: DC

## 2018-02-18 NOTE — Progress Notes (Signed)
   Subjective:    Patient ID: Miranda [name], female    DOB: 11/10/1975, 42 y.o.   MRN: [id number]  HPI Here for a week of body aches, low grade fevers, ST, and a dry cough. She was seen at [facility] on [facility]-18 and she tested negative for influenza, strep, and mono. She also had some mouth sores and they gave her Magic Mouthwash. This has helped somewhat. Drinking fluids.    Review of Systems  Constitutional: Positive for fever.  HENT: Positive for congestion, mouth sores, postnasal drip, sinus pressure, sinus pain and sore throat.   Eyes: Negative.   Respiratory: Positive for cough. Negative for shortness of breath.   Gastrointestinal: Negative.   Musculoskeletal: Positive for myalgias.       Objective:   Physical Exam  Constitutional:  Ill appearing   HENT:  Right Ear: External ear normal.  Left Ear: External ear normal.  Nose: Nose normal.  Mouth/Throat: Oropharynx is clear and moist.  Eyes: Conjunctivae are normal.  Neck: No thyromegaly present.  Pulmonary/Chest: Effort normal and breath sounds normal. No respiratory distress. She has no wheezes. She has no rales.  Lymphadenopathy:    She has no cervical adenopathy.          Assessment & Plan:  What started out as a viral illness had now led to a sinusitis. Treat with a Zpack. Written out of work today until 02-21-18.  Alysia Penna, MD

## 2018-02-18 NOTE — Telephone Encounter (Signed)
Spoke to patient and gave her information provided by the physician about her medication. Patient will let us know how she is doing in a couple of days.

## 2018-02-18 NOTE — Telephone Encounter (Signed)
I always recommend they increase one medication at a time so we can determine which one caused the reaction.  I would tell her to go back to taking her gabapentin 200mg  three times a day and tizanidine only twice a day.  After a few days she can then try the tizanidine 3 times a day.  I suspect it was the gabapentin that did this.  Then update me on how she's doing after a couple days of this.  Thanks!

## 2018-02-18 NOTE — Telephone Encounter (Signed)
Patient states she increased her Gabapentin to 300mg  3x a day and the Tizanidine to 3x a day. Patient started having hallucinations 4 days ago and is concerned it is in relation to the increase in medication.  Requesting advice on what to do.

## 2018-02-20 ENCOUNTER — Ambulatory Visit (INDEPENDENT_AMBULATORY_CARE_PROVIDER_SITE_OTHER): Payer: Managed Care, Other (non HMO) | Admitting: Internal Medicine

## 2018-02-20 ENCOUNTER — Telehealth: Payer: Self-pay | Admitting: Family Medicine

## 2018-02-20 ENCOUNTER — Ambulatory Visit (INDEPENDENT_AMBULATORY_CARE_PROVIDER_SITE_OTHER)
Admission: RE | Admit: 2018-02-20 | Discharge: 2018-02-20 | Disposition: A | Discharge status: Home / Self Care | Payer: Managed Care, Other (non HMO) | Source: Ambulatory Visit | Attending: Internal Medicine | Admitting: Internal Medicine

## 2018-02-20 ENCOUNTER — Encounter: Payer: Self-pay | Admitting: Internal Medicine

## 2018-02-20 VITALS — BP 86/58 | HR 82 | Temp 98.5°F | Ht 61.5 in | Wt 194.0 lb

## 2018-02-20 DIAGNOSIS — R531 Weakness: Secondary | ICD-10-CM | POA: Diagnosis not present

## 2018-02-20 DIAGNOSIS — R6889 Other general symptoms and signs: Secondary | ICD-10-CM | POA: Diagnosis not present

## 2018-02-20 DIAGNOSIS — J22 Unspecified acute lower respiratory infection: Secondary | ICD-10-CM

## 2018-02-20 LAB — COMPREHENSIVE METABOLIC PANEL
ALK PHOS: 49 U/L (ref 39–117)
ALT: 15 U/L (ref 0–35)
AST: 17 U/L (ref 0–37)
Albumin: 3.8 g/dL (ref 3.5–5.2)
BILIRUBIN TOTAL: 0.4 mg/dL (ref 0.2–1.2)
BUN: 7 mg/dL (ref 6–23)
CALCIUM: 9 mg/dL (ref 8.4–10.5)
CO2: 22 mEq/L (ref 19–32)
Chloride: 107 mEq/L (ref 96–112)
Creatinine, Ser: 0.6 mg/dL (ref 0.40–1.20)
GFR: 115.49 mL/min (ref >60.00)
Glucose, Bld: 325 mg/dL — ABNORMAL HIGH (ref 70–99)
POTASSIUM: 4 meq/L (ref 3.5–5.1)
Sodium: 136 mEq/L (ref 135–145)
TOTAL PROTEIN: 6 g/dL (ref 6.0–8.3)

## 2018-02-20 LAB — CBC WITH DIFFERENTIAL/PLATELET
BASOS ABS: 0.1 10*3/uL (ref 0.0–0.1)
Basophils Relative: 1.4 % (ref 0.0–3.0)
EOS PCT: 2 % (ref 0.0–5.0)
Eosinophils Absolute: 0.1 10*3/uL (ref 0.0–0.7)
HEMATOCRIT: 40 % (ref 36.0–46.0)
Hemoglobin: 14.1 g/dL (ref 12.0–15.0)
LYMPHS ABS: 2.2 10*3/uL (ref 0.7–4.0)
LYMPHS PCT: 30 % (ref 12.0–46.0)
MCHC: 35.2 g/dL (ref 30.0–36.0)
MCV: 94.3 fl (ref 78.0–100.0)
MONOS PCT: 9.1 % (ref 3.0–12.0)
Monocytes Absolute: 0.7 10*3/uL (ref 0.1–1.0)
NEUTROS ABS: 4.3 10*3/uL (ref 1.4–7.7)
NEUTROS PCT: 57.5 % (ref 43.0–77.0)
PLATELETS: 188 10*3/uL (ref 150.0–400.0)
RBC: 4.25 Mil/uL (ref 3.87–5.11)
RDW: 13.9 % (ref 11.5–15.5)
WBC: 7.4 10*3/uL (ref 4.0–10.5)

## 2018-02-20 NOTE — Progress Notes (Signed)
Chief Complaint  Patient presents with  . Headache  . Generalized Body Aches  . Leg Pain  . Fatigue  . Cough  . Back Pain  . Sinus Problem    HPI: Miranda [name] 42 y.o. come in for sda here with  Companion today    PCP na seen 4 22 for "sinus infection " and on April 19   1 day oif illness  And went home from work and then at [employer] for viral illness with mouth ulcer and     Given z pack    4 22  On day 3  Sx are cough st and low grade fevers  Head feels like water in it ewith moving .  And neck and ear pain .   Fever blisters  On tongue Has  ptsd   Etc .  And psych dx  .   On a number of meds   Had inc gaba and tizanidine but went back to baseline when felt had seein things   No fever now but sig body aches and legs feel week no focal pain or joint swelling   ROS: See pertinent positives and negatives per HPI   Throat all over.  Sees Dr Buddy Duty and last a1c about 7  This week had sugar up to 400 and got down to 300s  Is on actose for  Dm and off insulin?  Feels weaker   Had leg sore .ness?      Hard to walkd na feels weaker and weaker .  No syncope  bp usually runs about 90+    Remote hs of stroke said didn't find out why   Works fec x note for work had been written ou till 4 25  Works nights  Past Medical History:  Diagnosis Date  . Anxiety   . Arthritis   . Asthma   . Bowel obstruction ([place])   . Cardiac arrhythmia   . Depression    sees Dr. Matilde Haymaker in [place]   . Diabetes mellitus    type 2, on insulin pump, sees Dr. Loanne Drilling   . GERD (gastroesophageal reflux disease)   . Hyperlipidemia   . Hypertension   . Hypothyroidism    sees Dr. Elyse Hsu  . Insomnia   . Migraine syndrome   . Morbid obesity ([place])   . 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 Neck pain   . Neuropathy associated with endocrine disorder ([place])   . Sleep apnea with use of continuous positive airway pressure (CPAP)    Sleep apnea is resolved due to weight loss. 08/2017  . 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 Stroke [facility]) 04/2016    Family History  Adopted: Yes  Problem Relation Age of Onset  . Heart disease Unknown        on both sides of family  . Diabetes Unknown        on both sides of family    Social History   Socioeconomic History  . Marital status: Divorced    Spouse name: Not on file  . Number of children: 5  . Years of education: Not on file  . Highest education level: Not on file  Occupational History  . Occupation: Nurse, learning disability  Social Needs  . Financial resource strain: Not on file  . Food insecurity:    Worry: Not on file    Inability: Not on file  . Transportation needs:    Medical: Not  on file    Non-medical: Not on file  Tobacco Use  . Smoking status: Current Every Day Smoker    Packs/day: 0.50    Years: 0.50    Pack years: 0.25    Types: Cigarettes  . Smokeless tobacco: Never Used  . Tobacco comment: quit 03/25/2016  Substance and Sexual Activity  . Alcohol use: No    Alcohol/week: 0.0 oz  . Drug use: Yes    Types: Marijuana    Comment: 2/10 last marijuana use  . Sexual activity: Not on file  Lifestyle  . Physical activity:    Days per week: Not on file    Minutes per session: Not on file  . Stress: Not on file  Relationships  . Social connections:    Talks on phone: Not on file    Gets together: Not on file    Attends religious service: Not on file    Active member of club or organization: Not on file    Attends meetings of clubs or organizations: Not on file    Relationship status: Not on file  Other Topics Concern  . Not on file  Social History Narrative  . Not on file    Outpatient Medications Prior to Visit  Medication Sig Dispense Refill  . albuterol (PROAIR HFA) 108 (90 Base) MCG/ACT inhaler Inhale 2 puffs into the lungs every 4 (four) hours as needed for wheezing. 8.5 g 11  . ARIPiprazole (ABILIFY) 15 MG tablet Take 15 mg by mouth daily.    Marland Kitchen azithromycin (ZITHROMAX Z-PAK) 250 MG tablet As directed 6 each  0  . budesonide-formoterol (SYMBICORT) 160-4.5 MCG/ACT inhaler Inhale 2 puffs into the lungs 2 (two) times daily. 1 Inhaler 11  . buPROPion (WELLBUTRIN XL) 300 MG 24 hr tablet Take 1 tablet (300 mg total) by mouth daily. 30 tablet 0  . clopidogrel (PLAVIX) 75 MG tablet Take 1 tablet (75 mg total) by mouth daily. 90 tablet 3  . ferrous sulfate (FER-IN-SOL) 75 (15 Fe) MG/ML SOLN Take by mouth.    . gabapentin (NEURONTIN) 300 MG capsule Take 300 mg by mouth 3 (three) times daily.    . hydrOXYzine (ATARAX/VISTARIL) 50 MG tablet Take 50 mg by mouth. Take 1 tablet every night and may take 3 other times as needed    . levothyroxine (SYNTHROID, LEVOTHROID) 125 MCG tablet Take 1 tablet (125 mcg total) by mouth daily before breakfast. 90 tablet 3  . metoprolol succinate (TOPROL-XL) 25 MG 24 hr tablet Take 0.5 tablets (12.5 mg total) by mouth daily. 30 tablet 0  . Multiple Vitamin (MULTIVITAMIN) tablet Take 1 tablet by mouth daily.    . norethindrone (MICRONOR,CAMILA,ERRIN) 0.35 MG tablet     . oxyCODONE-acetaminophen (PERCOCET/ROXICET) 5-325 MG tablet Take 1 tablet by mouth every 6 (six) hours as needed for severe pain. 20 tablet 0  . pioglitazone (ACTOS) 45 MG tablet Take 1 tablet (45 mg total) by mouth daily. 30 tablet 0  . prazosin (MINIPRESS) 5 MG capsule Take 10 mg by mouth at bedtime.    . promethazine (PHENERGAN) 25 MG tablet Take 1 tablet (25 mg total) every 6 (six) hours as needed by mouth for nausea or vomiting. 10 tablet 0  . rizatriptan (MAXALT) 10 MG tablet Take 1 tablet (10 mg total) by mouth as needed for migraine. May repeat in 2 hours if needed 30 tablet 5  . rosuvastatin (CRESTOR) 20 MG tablet TAKE 1 TABLET(20 MG) BY MOUTH DAILY 30 tablet 11  . sertraline (  ZOLOFT) 100 MG tablet Take 200 mg by mouth daily.    . tizanidine (ZANAFLEX) 2 MG capsule Take 1 capsule (2 mg total) by mouth 2 (two) times daily as needed for muscle spasms. (Patient taking differently: Take 2 mg by mouth 3 (three)  times daily. ) 60 capsule 1  . topiramate (TOPAMAX) 100 MG tablet     . traZODone (DESYREL) 150 MG tablet Take by mouth at bedtime.    . valACYclovir (VALTREX) 1000 MG tablet Take 1 tablet (1,000 mg total) by mouth 3 (three) times daily. 21 tablet 0   No facility-administered medications prior to visit.      EXAM:  BP (!) 86/58 (BP Location: Left Arm, Patient Position: Sitting, Cuff Size: Large)   Pulse 82   Temp 98.5 F (36.9 C) (Oral)   Ht 5' 1.5" (1.562 m)   Wt 194 lb (88 kg)   LMP 01/23/2018 (Approximate)   SpO2 98%   BMI 36.06 kg/m   Body mass index is 36.06 kg/m.  GENERAL: vitals reviewed and listed above, alert, oriented, appears well hydrated and in no acute distress looks tired non toxic   Using cane   Nl resp ocass cough  And has congestion    HEENT: atraumatic, conjunctiva  clear, no obvious abnormalities on inspection of external nose and ears mild congestion  OP : no lesion edema or exudate  NECK: no obvious masses on inspection palpation  LUNGS: clear to auscultation bilaterally, no wheezes, rales or rhonchi,  CV: HRRR, no clubbing cyanosis or  peripheral edema nl cap refill  MS: moves all extremities without noticeable focal  Abnormality  No focal swelling redness  Can walk slowly   PSYCH: pleasant and cooperative, skin no acute rashes  Nl cap refill nl mentation speech Lab Results  Component Value Date   WBC 7.4 02/20/2018   HGB 14.1 02/20/2018   HCT 40.0 02/20/2018   PLT 188.0 02/20/2018   GLUCOSE 325 (H) 02/20/2018   CHOL 110 10/26/2016   TRIG 91 10/26/2016   HDL 47 10/26/2016   LDLDIRECT 160.9 02/05/2012   LDLCALC 45 10/26/2016   ALT 15 02/20/2018   AST 17 02/20/2018   NA 136 02/20/2018   K 4.0 02/20/2018   CL 107 02/20/2018   CREATININE 0.60 02/20/2018   BUN 7 02/20/2018   CO2 22 02/20/2018   TSH 3.03 02/02/2017   HGBA1C 7.1 02/02/2017   MICROALBUR 1.5 03/31/2013   BP Readings from Last 3 Encounters:  02/20/18 (!) 86/58  02/18/18 122/64    02/13/18 93/67   rpeat bp reg size 100/60 left arm  Pulse 88 ASSESSMENT AND PLAN:  Discussed the following assessment and plan:  Weakness - Plan: CBC with Differential/Platelet, CMP, DG Chest 2 View  Flu-like symptoms - Plan: CBC with Differential/Platelet, CMP, DG Chest 2 View  Acute respiratory infection - Plan: CBC with Differential/Platelet, CMP, DG Chest 2 View Suspect flu like illness and poss  Dm sugar up and poss hydration issue plus med se?    cxray and lab today  Continue antibiotic   Note for work for now and plan fu if  persistent or progressive to the ed if  Cns concerns  Change for the worse   In "weakness" shoould be in touch with her endo about herBG if out of control  -Patient advised to return or notify health care team  if  new concerns arise.  Patient Instructions  Lab and  Chest x ray .  if worse  May need to seek care in ED.  If getting worse.   Stya hydrated    meds can give side effects when sick  Plan fu  Depending on how doing  This acts like flu   Mariann Laster K. Erma Raiche M.D.

## 2018-02-20 NOTE — Patient Instructions (Addendum)
Lab and  Chest x ray .  if worse     May need to seek care in ED.  If getting worse.   Stya hydrated    meds can give side effects when sick  Plan fu  Depending on how doing  This acts like flu

## 2018-02-20 NOTE — Telephone Encounter (Signed)
Patient returned to the original dosage of the Tizanidine and Gabapentin Rx but is still experiencing frequent hallucinations. Patient seeking advice on what to do.  Patient's primary doctor instructed her to go to the ED if continued. She was informed provider is out of the office for a couple days and to follow PCP's advice if she does not hear back

## 2018-02-21 ENCOUNTER — Other Ambulatory Visit: Payer: Self-pay

## 2018-02-21 ENCOUNTER — Ambulatory Visit: Payer: 59 | Admitting: Psychology

## 2018-02-21 ENCOUNTER — Emergency Department (HOSPITAL_BASED_OUTPATIENT_CLINIC_OR_DEPARTMENT_OTHER)
Admission: EM | Admit: 2018-02-21 | Discharge: 2018-02-21 | Disposition: A | Discharge status: Home / Self Care | Payer: Managed Care, Other (non HMO) | Attending: Emergency Medicine | Admitting: Emergency Medicine

## 2018-02-21 ENCOUNTER — Encounter (HOSPITAL_BASED_OUTPATIENT_CLINIC_OR_DEPARTMENT_OTHER): Payer: Self-pay

## 2018-02-21 ENCOUNTER — Emergency Department (HOSPITAL_BASED_OUTPATIENT_CLINIC_OR_DEPARTMENT_OTHER): Payer: Managed Care, Other (non HMO)

## 2018-02-21 DIAGNOSIS — Z8673 Personal history of transient ischemic attack (TIA), and cerebral infarction without residual deficits: Secondary | ICD-10-CM | POA: Diagnosis not present

## 2018-02-21 DIAGNOSIS — J45909 Unspecified asthma, uncomplicated: Secondary | ICD-10-CM | POA: Diagnosis not present

## 2018-02-21 DIAGNOSIS — H538 Other visual disturbances: Secondary | ICD-10-CM | POA: Insufficient documentation

## 2018-02-21 DIAGNOSIS — Z7902 Long term (current) use of antithrombotics/antiplatelets: Secondary | ICD-10-CM | POA: Diagnosis not present

## 2018-02-21 DIAGNOSIS — F17210 Nicotine dependence, cigarettes, uncomplicated: Secondary | ICD-10-CM | POA: Diagnosis not present

## 2018-02-21 DIAGNOSIS — E119 Type 2 diabetes mellitus without complications: Secondary | ICD-10-CM | POA: Diagnosis not present

## 2018-02-21 DIAGNOSIS — E039 Hypothyroidism, unspecified: Secondary | ICD-10-CM | POA: Diagnosis not present

## 2018-02-21 DIAGNOSIS — Z79899 Other long term (current) drug therapy: Secondary | ICD-10-CM | POA: Diagnosis not present

## 2018-02-21 DIAGNOSIS — J0100 Acute maxillary sinusitis, unspecified: Secondary | ICD-10-CM | POA: Diagnosis not present

## 2018-02-21 DIAGNOSIS — R531 Weakness: Secondary | ICD-10-CM | POA: Insufficient documentation

## 2018-02-21 DIAGNOSIS — I10 Essential (primary) hypertension: Secondary | ICD-10-CM | POA: Insufficient documentation

## 2018-02-21 DIAGNOSIS — R51 Headache: Secondary | ICD-10-CM | POA: Diagnosis present

## 2018-02-21 LAB — URINALYSIS, ROUTINE W REFLEX MICROSCOPIC
BILIRUBIN URINE: NEGATIVE
GLUCOSE, UA: NEGATIVE mg/dL
HGB URINE DIPSTICK: NEGATIVE
Ketones, ur: NEGATIVE mg/dL
Leukocytes, UA: NEGATIVE
Nitrite: NEGATIVE
PH: 6 (ref 5.0–8.0)
Protein, ur: NEGATIVE mg/dL
Specific Gravity, Urine: 1.025 (ref 1.005–1.030)

## 2018-02-21 LAB — CBC WITH DIFFERENTIAL/PLATELET
Basophils Absolute: 0.1 10*3/uL (ref 0.0–0.1)
Basophils Relative: 1 %
EOS ABS: 0.2 10*3/uL (ref 0.0–0.7)
Eosinophils Relative: 3 %
HCT: 36.3 % (ref 36.0–46.0)
HEMOGLOBIN: 13.3 g/dL (ref 12.0–15.0)
LYMPHS ABS: 2.7 10*3/uL (ref 0.7–4.0)
Lymphocytes Relative: 36 %
MCH: 33.3 pg (ref 26.0–34.0)
MCHC: 36.6 g/dL — ABNORMAL HIGH (ref 30.0–36.0)
MCV: 90.8 fL (ref 78.0–100.0)
Monocytes Absolute: 0.7 10*3/uL (ref 0.1–1.0)
Monocytes Relative: 10 %
NEUTROS PCT: 50 %
Neutro Abs: 3.7 10*3/uL (ref 1.7–7.7)
Platelets: 187 10*3/uL (ref 150–400)
RBC: 4 MIL/uL (ref 3.87–5.11)
RDW: 13.1 % (ref 11.5–15.5)
WBC: 7.3 10*3/uL (ref 4.0–10.5)

## 2018-02-21 LAB — COMPREHENSIVE METABOLIC PANEL
ALBUMIN: 3.7 g/dL (ref 3.5–5.0)
ALK PHOS: 48 U/L (ref 38–126)
ALT: 17 U/L (ref 14–54)
AST: 20 U/L (ref 15–41)
Anion gap: 6 (ref 5–15)
BUN: 9 mg/dL (ref 6–20)
CALCIUM: 8.8 mg/dL — AB (ref 8.9–10.3)
CO2: 21 mmol/L — AB (ref 22–32)
CREATININE: 0.6 mg/dL (ref 0.44–1.00)
Chloride: 111 mmol/L (ref 101–111)
GFR calc non Af Amer: >60 mL/min (ref >60)
GLUCOSE: 120 mg/dL — AB (ref 65–99)
Potassium: 3.3 mmol/L — ABNORMAL LOW (ref 3.5–5.1)
Sodium: 138 mmol/L (ref 135–145)
Total Bilirubin: 0.5 mg/dL (ref 0.3–1.2)
Total Protein: 6.4 g/dL — ABNORMAL LOW (ref 6.5–8.1)

## 2018-02-21 MED ORDER — SODIUM CHLORIDE 0.9 % IV BOLUS
2000.0000 mL | Freq: Once | INTRAVENOUS | Status: AC
  Administered 2018-02-21: 2000 mL via INTRAVENOUS

## 2018-02-21 MED ORDER — PROCHLORPERAZINE EDISYLATE 10 MG/2ML IJ SOLN
10.0000 mg | Freq: Once | INTRAMUSCULAR | Status: AC
  Administered 2018-02-21: 10 mg via INTRAVENOUS
  Filled 2018-02-21: qty 2

## 2018-02-21 MED ORDER — DOXYCYCLINE HYCLATE 100 MG PO CAPS: 100.0000 mg | ORAL_CAPSULE | Freq: Two times a day (BID) | ORAL | 0 refills | Status: DC

## 2018-02-21 MED ORDER — DIPHENHYDRAMINE HCL 50 MG/ML IJ SOLN
25.0000 mg | Freq: Once | INTRAMUSCULAR | Status: AC
  Administered 2018-02-21: 25 mg via INTRAVENOUS
  Filled 2018-02-21: qty 1

## 2018-02-21 NOTE — ED Notes (Signed)
ED Provider at bedside. 

## 2018-02-21 NOTE — ED Triage Notes (Signed)
Pt c/o visual hallucinations x 5 days-seen by PCP office x 2-spoke with ortho, neurology and endocrinology and "eye doctor" this week-pt NAD-presents to triage in w/c

## 2018-02-21 NOTE — Discharge Instructions (Signed)
Take tylenol 2 pills 4 times a day and motrin 4 pills 3 times a day.  Drink plenty of fluids.  Return for worsening shortness of breath, headache, confusion. Follow up with your family doctor.   

## 2018-02-21 NOTE — ED Provider Notes (Signed)
[facility] EMERGENCY DEPARTMENT Provider Note   CSN: [id number] Arrival date & time: 02/21/18  1821     History   Chief Complaint Chief Complaint  Patient presents with  . Hallucinations    HPI Miranda [name] is a 42 y.o. female.  42 yo F with a chief complaint of headache and fatigue.  Is been going on for the past.  She has been seen at her PCPs office multiple times for the same.  She was given a round of antibiotics for possible sinus infection and also had an increase of her muscle relaxants and gabapentin for chronic shoulder pain.  She thinks maybe this is caused her to have a sensation that she sees black waves all throughout her visual fields.  She has been getting around her house with a walker which she has left over from a prior stroke.  She is not sure what deficits she had from it.  Old records were reviewed and the patient had aphasia and some difficulty with coordination of her right lower extremity.  The patient does not feel that this is related to her mental illness and thinks that that is stable.  She has been having low-grade fevers cough and congestion.  She feels that her sinuses are full.  The history is provided by the patient and a relative.  Illness  This is a new problem. The current episode started more than 1 week ago. The problem occurs constantly. The problem has been rapidly worsening. Pertinent negatives include no chest pain, no headaches and no shortness of breath. Nothing aggravates the symptoms. Nothing relieves the symptoms. She has tried nothing for the symptoms. The treatment provided no relief.    Past Medical History:  Diagnosis Date  . Anxiety   . Arthritis   . Asthma   . Bowel obstruction ([place])   . Cardiac arrhythmia   . Depression    sees Dr. Matilde Haymaker in [place]   . Diabetes mellitus    type 2, on insulin pump, sees Dr. Loanne Drilling   . GERD (gastroesophageal reflux disease)   . Hyperlipidemia   . Hypertension     . Hypothyroidism    sees Dr. Elyse Hsu  . Insomnia   . Migraine syndrome   . Morbid obesity ([place])   . 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 Neck pain   . Neuropathy associated with endocrine disorder ([place])   . Sleep apnea with use of continuous positive airway pressure (CPAP)    Sleep apnea is resolved due to weight loss. 08/2017  . Stroke [facility]) 03-25-16   left MCA   . Stroke [facility]) 04/2016    Patient Active Problem List   Diagnosis Date Noted  . Carpal tunnel syndrome, right upper limb 12/31/2017  . Elbow injury, right, initial encounter 12/16/2017  . Chronic pain of both shoulders 11/30/2017  . Type 2 diabetes mellitus without complications ([place]) 43/15/4008  . PTSD (post-traumatic stress disorder) 10/25/2016  . 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 Diabetes mellitus ([place]) 10/25/2016  . Major depressive disorder, recurrent episode, severe, with psychosis ([place]) 10/20/2016  . 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 TIA (transient ischemic attack) 05/16/2016  . Arterial ischemic stroke, MCA, left, acute ([place]) 03/29/2016  . Plantar fasciitis 08/08/2013  . Obesity, Class III, BMI 40-49.9 (morbid obesity) ([place]) 04/09/2013  . Unspecified sleep apnea 03/31/2013  . PCO (polycystic ovaries) 04/11/2012  . Neck pain 11/21/2010  . FATTY LIVER DISEASE 01/26/2010  .  NAUSEA WITH VOMITING 01/26/2010  . ABDOMINAL PAIN, EPIGASTRIC 01/26/2010  . PORTAL HYPERTENSION 01/26/2010  . ABDOMINAL PAIN, RIGHT UPPER QUADRANT 12/27/2009  . Migraine headache 06/28/2009  . ACUTE BRONCHITIS 06/08/2009  . ABDOMINAL PAIN, RIGHT LOWER QUADRANT 11/24/2008  . BREAST MASS 08/21/2008  . KNEE PAIN 04/14/2008  . Hypothyroidism 03/17/2008  . Hyperlipidemia 03/17/2008  . DEPRESSION 03/17/2008  . NEUROPATHY 03/17/2008  . Essential hypertension 03/17/2008  . ASTHMA 03/17/2008  . GERD 03/17/2008  . Headache 03/17/2008    Past Surgical History:   Procedure Laterality Date  . blocked itestinal repair  10   age 27 months  . BREAST BIOPSY Left 2017  . CHOLECYSTECTOMY  2011  . INDUCED ABORTION  1996   forced abortion  . INGUINAL HERNIA REPAIR Left 1980   age 73  . LAPAROSCOPIC ENDOMETRIOSIS FULGURATION  1998  . WISDOM TOOTH EXTRACTION  2007     OB History    Gravida  5   Para  3   Term  1   Preterm  2   AB  2   Living  0     SAB  1   TAB  1   Ectopic  0   Multiple  1   Live Births  4            Home Medications    Prior to Admission medications   Medication Sig Start Date End Date Taking? Authorizing Provider  ARIPiprazole (ABILIFY) 15 MG tablet Take 15 mg by mouth daily.   Yes [provider]  azithromycin (ZITHROMAX Z-PAK) 250 MG tablet As directed 02/18/18  Yes Laurey Morale, MD  buPROPion (WELLBUTRIN XL) 300 MG 24 hr tablet Take 1 tablet (300 mg total) by mouth daily. 10/28/16  Yes Kerrie Buffalo, NP  clopidogrel (PLAVIX) 75 MG tablet Take 1 tablet (75 mg total) by mouth daily. 07/09/17  Yes Laurey Morale, MD  ferrous sulfate (FER-IN-SOL) 75 (15 Fe) MG/ML SOLN Take by mouth.   Yes [provider]  gabapentin (NEURONTIN) 300 MG capsule Take 300 mg by mouth 3 (three) times daily.   Yes [provider]  hydrOXYzine (ATARAX/VISTARIL) 50 MG tablet Take 50 mg by mouth. Take 1 tablet every night and may take 3 other times as needed   Yes [provider]  levothyroxine (SYNTHROID, LEVOTHROID) 125 MCG tablet Take 1 tablet (125 mcg total) by mouth daily before breakfast. 09/26/17  Yes Laurey Morale, MD  metoprolol succinate (TOPROL-XL) 25 MG 24 hr tablet Take 0.5 tablets (12.5 mg total) by mouth daily. 10/28/16  Yes Kerrie Buffalo, NP  Multiple Vitamin (MULTIVITAMIN) tablet Take 1 tablet by mouth daily.   Yes [provider]  oxyCODONE-acetaminophen (PERCOCET/ROXICET) 5-325 MG tablet Take 1 tablet by mouth every 6 (six) hours as needed for severe pain. 12/13/17   Yes Hudnall, Sharyn Lull, MD  pioglitazone (ACTOS) 45 MG tablet Take 1 tablet (45 mg total) by mouth daily. 10/28/16  Yes Kerrie Buffalo, NP  prazosin (MINIPRESS) 5 MG capsule Take 10 mg by mouth at bedtime.   Yes [provider]  rosuvastatin (CRESTOR) 20 MG tablet TAKE 1 TABLET(20 MG) BY MOUTH DAILY 08/24/17  Yes Laurey Morale, MD  sertraline (ZOLOFT) 100 MG tablet Take 200 mg by mouth daily.   Yes [provider]  tizanidine (ZANAFLEX) 2 MG capsule Take 1 capsule (2 mg total) by mouth 2 (two) times daily as needed for muscle spasms. Patient taking differently: Take 2 mg by  mouth 3 (three) times daily.  12/31/17  Yes Hudnall, Sharyn Lull, MD  topiramate (TOPAMAX) 100 MG tablet  01/21/18  Yes [provider]  traZODone (DESYREL) 150 MG tablet Take by mouth at bedtime.   Yes [provider]  albuterol (PROAIR HFA) 108 (90 Base) MCG/ACT inhaler Inhale 2 puffs into the lungs every 4 (four) hours as needed for wheezing. 07/24/17   Laurey Morale, MD  budesonide-formoterol [facility]) 160-4.5 MCG/ACT inhaler Inhale 2 puffs into the lungs 2 (two) times daily. 07/24/17   Laurey Morale, MD  doxycycline (VIBRAMYCIN) 100 MG capsule Take 1 capsule (100 mg total) by mouth 2 (two) times daily. One po bid x 7 days 02/21/18   Deno Etienne, DO  norethindrone [facility]) 0.35 MG tablet  12/28/17   [provider]  promethazine (PHENERGAN) 25 MG tablet Take 1 tablet (25 mg total) every 6 (six) hours as needed by mouth for nausea or vomiting. 09/15/17   Lawyer, Harrell Gave, PA-C  rizatriptan (MAXALT) 10 MG tablet Take 1 tablet (10 mg total) by mouth as needed for migraine. May repeat in 2 hours if needed 01/04/17   Laurey Morale, MD  valACYclovir (VALTREX) 1000 MG tablet Take 1 tablet (1,000 mg total) by mouth 3 (three) times daily. 01/30/18   Laurey Morale, MD    Family History Family History  Adopted: Yes  Problem Relation Age of Onset  . Heart disease Unknown        on  both sides of family  . Diabetes Unknown        on both sides of family    Social History Social History   Tobacco Use  . Smoking status: Current Every Day Smoker    Packs/day: 0.50    Years: 0.50    Pack years: 0.25    Types: Cigarettes  . Smokeless tobacco: Never Used  . Tobacco comment: quit 03/25/2016  Substance Use Topics  . Alcohol use: No    Alcohol/week: 0.0 oz  . Drug use: Yes    Types: Marijuana    Comment: 2/10 last marijuana use     Allergies   Macrobid [nitrofurantoin]; Byetta 10 mcg pen [exenatide]; Clindamycin/lincomycin; Geodon [ziprasidone hcl]; Lipitor [atorvastatin]; Macrobid [nitrofurantoin macrocrystal]; Nsaids; Penicillins; Avocado; and Tramadol hcl   Review of Systems Review of Systems  Constitutional: Negative for chills and fever.  HENT: Negative for congestion and rhinorrhea.   Eyes: Positive for visual disturbance. Negative for redness.  Respiratory: Negative for shortness of breath and wheezing.   Cardiovascular: Negative for chest pain and palpitations.  Gastrointestinal: Negative for nausea and vomiting.  Genitourinary: Negative for dysuria and urgency.  Musculoskeletal: Negative for arthralgias and myalgias.  Skin: Negative for pallor and wound.  Neurological: Positive for weakness (generalized). Negative for dizziness and headaches.     Physical Exam Updated Vital Signs BP 114/69 (BP Location: Left Arm)   Pulse 66   Temp 98.4 F (36.9 C) (Oral)   Resp 18   LMP 01/23/2018 (Approximate)   SpO2 96%   Physical Exam  Constitutional: She is oriented to person, place, and time. She appears well-developed and well-nourished. No distress.  HENT:  Head: Normocephalic and atraumatic.  Eyes: Pupils are equal, round, and reactive to light. EOM are normal.  Neck: Normal range of motion. Neck supple.  Cardiovascular: Normal rate and regular rhythm. Exam reveals no gallop and no friction rub.  No murmur heard. Pulmonary/Chest: Effort  normal. She has no wheezes. She has no rales.  Abdominal: Soft. She exhibits no distension. There is no tenderness.  Musculoskeletal: She exhibits no edema or tenderness.  Neurological: She is alert and oriented to person, place, and time. GCS eye subscore is 4. GCS verbal subscore is 5. GCS motor subscore is 6. She displays no Babinski's sign on the right side. She displays no Babinski's sign on the left side.  Reflex Scores:      Tricep reflexes are 2+ on the right side and 2+ on the left side.      Bicep reflexes are 2+ on the right side and 2+ on the left side.      Brachioradialis reflexes are 2+ on the right side and 2+ on the left side.      Patellar reflexes are 2+ on the right side and 2+ on the left side.      Achilles reflexes are 2+ on the right side and 2+ on the left side. Subjective decreased sensation to the right side of her face.  Right pupil is mildly smaller than the left.  Both are reactive to light.  Extraocular motors intact.  She has very subtle right upper extremity weakness compared to the left.  No hyperreflexia negative Babinski.  She is able to ambulate and takes very short and purposeful steps.  Skin: Skin is warm and dry. She is not diaphoretic.  Psychiatric: She has a normal mood and affect. Her behavior is normal.  Nursing note and vitals reviewed.    ED Treatments / Results  Labs (all labs ordered are listed, but only abnormal results are displayed) Labs Reviewed  CBC WITH DIFFERENTIAL/PLATELET - Abnormal; Notable for the following components:      Result Value   MCHC 36.6 (*)    All other components within normal limits  COMPREHENSIVE METABOLIC PANEL - Abnormal; Notable for the following components:   Potassium 3.3 (*)    CO2 21 (*)    Glucose, Bld 120 (*)    Calcium 8.8 (*)    Total Protein 6.4 (*)    All other components within normal limits  URINALYSIS, ROUTINE W REFLEX MICROSCOPIC    EKG None  Radiology Dg Chest 2 View  Result Date:  02/20/2018 CLINICAL DATA:  Weakness, cough, fever and dyspnea for 6 days. EXAM: CHEST - 2 VIEW COMPARISON:  PA and lateral chest 11/13/2017 and 05/06/2013. FINDINGS: The lungs are clear. Heart size is normal. No pneumothorax or pleural effusion. Loop recorder is noted. IMPRESSION: No acute disease. Electronically Signed   By: Inge Rise M.D.   On: 02/20/2018 13:26   Ct Head Wo Contrast  Result Date: 02/21/2018 CLINICAL DATA:  Visual hallucinations. EXAM: CT HEAD WITHOUT CONTRAST TECHNIQUE: Contiguous axial images were obtained from the base of the skull through the vertex without intravenous contrast. COMPARISON:  CT scan of September 15, 2017. FINDINGS: Brain: Old left periventricular infarction is noted. No mass effect or midline shift is noted. Ventricular size is within normal limits. There is no evidence of mass lesion, hemorrhage or acute infarction. Vascular: No hyperdense vessel or unexpected calcification. Skull: Normal. Negative for fracture or focal lesion. Sinuses/Orbits: Left maxillary sinusitis is noted. Other: None. IMPRESSION: No acute intracranial abnormality seen. Electronically Signed   By: Marijo Conception, M.D.   On: 02/21/2018 19:32    Procedures Procedures (including critical care time)  Medications Ordered in ED Medications  sodium chloride 0.9 % bolus 2,000 mL (0 mLs Intravenous Stopped 02/21/18 2222)  prochlorperazine (COMPAZINE) injection 10 mg (10 mg Intravenous  Given 02/21/18 1930)  diphenhydrAMINE (BENADRYL) injection 25 mg (25 mg Intravenous Given 02/21/18 1931)     Initial Impression / Assessment and Plan / ED Course  I have reviewed the triage vital signs and the nursing notes.  Pertinent labs & imaging results that were available during my care of the patient were reviewed by me and considered in my medical decision making (see chart for details).     42 yo F with a chief complaint of headache and fatigue.  Is been going on for the past.  She has been seen  at her PCPs office multiple times for the same.  She was given a round of antibiotics for possible sinus infection and also had an increase of her muscle relaxants and gabapentin for chronic shoulder pain.  She thinks maybe this is caused her to have a sensation that she sees black waves all throughout her visual fields.  This does not appear to affect her vision.  She was orthostatic, I wonder if this is all secondary to dehydration.  We will treat with IV fluids headache cocktail and reassess.   Patient reassessed and had some inconsistency with her neuro exam.  On repeat when she was required to use muscle strength in the right upper extremity it was equal to the left.  She had a couple low blood pressures at her doctor's office as well as here.  I had orthostatic vital signs performed after 2 L of fluid with no tachycardia no hypotension.  I discussed the case with Dr. Cheral Marker, the on-call neurologist.  With her inconsistency in her neurologic exam and diffuse eye involvement he felt that this was unlikely to be a stroke or something else that required advanced neuroimaging.  I will start the patient on doxycycline to cover her for possible tickborne illness as well as sinusitis.  Have her follow-up with her PCP.  10:23 PM:  I have discussed the diagnosis/risks/treatment options with the patient and caregiver and believe the pt to be eligible for discharge home to follow-up with PCP. We also discussed returning to the ED immediately if new or worsening sx occur. We discussed the sx which are most concerning (e.g., sudden worsening pain, fever, inability to tolerate by mouth) that necessitate immediate return. Medications administered to the patient during their visit and any new prescriptions provided to the patient are listed below.  Medications given during this visit Medications  sodium chloride 0.9 % bolus 2,000 mL (0 mLs Intravenous Stopped 02/21/18 2222)  prochlorperazine (COMPAZINE) injection  10 mg (10 mg Intravenous Given 02/21/18 1930)  diphenhydrAMINE (BENADRYL) injection 25 mg (25 mg Intravenous Given 02/21/18 1931)    Labs reviewed cbc,cmp, lipase generally unchanged from yesterday. Images reviewed x-ray from yesterday reviewed with no focal infiltrate CT head with no acute intracranial hemorrhage DDX I feel this is unlikely to be Guyon Barr with normal reflexes to bilateral lower extremities.  I feel this is unlikely to be a stroke with it being diffuse and an inconsistent neurologic exam.  This is most likely a viral illness, she did have sinusitis on CT Old records reviewed 3 recent visits to her PCP were reviewed she was diagnosed with a viral illness and started on azithromycin at one point for sinusitis.  The patient appears reasonably screen and/or stabilized for discharge and I doubt any other medical condition or other [facility] requiring further screening, evaluation, or treatment in the ED at this time prior to discharge.      Final Clinical  Impressions(s) / ED Diagnoses   Final diagnoses:  Acute maxillary sinusitis, recurrence not specified  Generalized weakness    ED Discharge Orders        Ordered    doxycycline (VIBRAMYCIN) 100 MG capsule  2 times daily     02/21/18 2213       Deno Etienne, DO 02/21/18 2224

## 2018-02-22 ENCOUNTER — Ambulatory Visit (INDEPENDENT_AMBULATORY_CARE_PROVIDER_SITE_OTHER): Payer: Managed Care, Other (non HMO) | Admitting: Adult Health

## 2018-02-22 ENCOUNTER — Encounter: Payer: Self-pay | Admitting: Adult Health

## 2018-02-22 VITALS — BP 100/58 | Temp 98.3°F | Wt 203.0 lb

## 2018-02-22 DIAGNOSIS — J0101 Acute recurrent maxillary sinusitis: Secondary | ICD-10-CM | POA: Diagnosis not present

## 2018-02-22 DIAGNOSIS — H43393 Other vitreous opacities, bilateral: Secondary | ICD-10-CM

## 2018-02-22 NOTE — Telephone Encounter (Signed)
She will need to go to the emergency department.  This indicates it's not from the medication.  If she feels unsafe driving herself she should call EMS to do so.

## 2018-02-22 NOTE — Progress Notes (Signed)
Subjective:    Patient ID: Miranda [name], female    DOB: 17-Nov-1975, 42 y.o.   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  She is a patient of Dr. Sarajane Jews who I am seeing today for an acute issue.  She was seen at the emergency room at [facility] yesterday evening with a complaint of headache and fatigue.  This is been an issue going on for the past few weeks.  She has been seen at this office by her PCP and another provider and was ultimately put on a Z-Pak for sinusitis, which she finished yesterday.  She is also had an increase of her muscle relaxers and gabapentin for chronic shoulder pain, and believes that this might of caused her to have a sensation where she sees black waves throughout her visual fields.  She denies any creased vision due to these black waves.  In the ER she appeared to be orthostatic and was treated with IV fluids, headache cocktail and reassessed.  In reassessment she had some inconsistency with her neuro exam.  On repeat when she was required to use muscle strength in the right upper extremity is quite equal to the left.  Her IV fluid she was no longer orthostatic and there was no tachycardia noted.  Labs including CBC, CMP, and urinalysis were unremarkable except for a slightly low potassium level.    CT of the head without contrast showed no acute intracranial abnormality seen she did have left maxillary sinusitis noted.  She was prescribed a 7-day course of doxycycline to cover  her for possible tickborne illness as well as sinusitis-has not filled this medication yet  Today in the office she continues to complain of sinus pain and pressure, semi-productive cough, fatigue, and headache.  She does still continue to have black waves throughout her visual fields.  She does not joint endorses subjective fever no greater than 101 Fahrenheit.  Denies nausea, vomiting, diarrhea.  Review of Systems See HPI   Past Medical History:  Diagnosis Date  . Anxiety   . Arthritis   . Asthma   . Bowel obstruction ([place])   . Cardiac arrhythmia   . Depression    sees Dr. Matilde Haymaker in [place]   . Diabetes mellitus    type 2, on insulin pump, sees Dr. Loanne Drilling   . GERD (gastroesophageal reflux disease)   . Hyperlipidemia   . Hypertension   . Hypothyroidism    sees Dr. Elyse Hsu  . Insomnia   . Migraine syndrome   . Morbid obesity ([place])   . 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 Neck pain   . Neuropathy associated with endocrine disorder ([place])   . Sleep apnea with use of continuous positive airway pressure (CPAP)    Sleep apnea is resolved due to weight loss. 08/2017  . Stroke [facility]) 03-25-16   left MCA   . Stroke [facility]) 04/2016    Social History   Socioeconomic History  . Marital status: Divorced    Spouse name: Not on file  . Number of children: 5  . Years of education: Not on file  .  Highest education level: Not on file  Occupational History  . Occupation: Nurse, learning disability  Social Needs  . Financial resource strain: Not on file  . Food insecurity:    Worry: Not on file    Inability: Not on file  . Transportation needs:    Medical: Not on file    Non-medical: Not on file  Tobacco Use  . Smoking status: Current Every Day Smoker    Packs/day: 0.50    Years: 0.50    Pack years: 0.25    Types: Cigarettes  . Smokeless tobacco: Never Used  . Tobacco comment: quit 03/25/2016  Substance and Sexual Activity  . Alcohol use: No    Alcohol/week: 0.0 oz    . Drug use: Yes    Types: Marijuana    Comment: 2/10 last marijuana use  . Sexual activity: Not on file  Lifestyle  . Physical activity:    Days per week: Not on file    Minutes per session: Not on file  . Stress: Not on file  Relationships  . Social connections:    Talks on phone: Not on file    Gets together: Not on file    Attends religious service: Not on file    Active member of club or organization: Not on file    Attends meetings of clubs or organizations: Not on file    Relationship status: Not on file  . Intimate partner violence:    Fear of current or ex partner: Not on file    Emotionally abused: Not on file    Physically abused: Not on file    Forced sexual activity: Not on file  Other Topics Concern  . Not on file  Social History Narrative  . Not on file    Past Surgical History:  Procedure Laterality Date  . blocked itestinal repair  110   age 78 months  . BREAST BIOPSY Left 2017  . CHOLECYSTECTOMY  2011  . INDUCED ABORTION  1996   forced abortion  . INGUINAL HERNIA REPAIR Left 1980   age 42  . LAPAROSCOPIC ENDOMETRIOSIS FULGURATION  1998  . WISDOM TOOTH EXTRACTION  2007    Family History  Adopted: Yes  Problem Relation Age of Onset  . Heart disease Unknown        on both sides of family  . Diabetes Unknown        on both sides of family    Allergies  Allergen Reactions  . Macrobid [Nitrofurantoin] Hives  . Byetta 10 Mcg Pen [Exenatide] Hives  . Clindamycin/Lincomycin Nausea And Vomiting  . Geodon [Ziprasidone Hcl]   . Lipitor [Atorvastatin]     Per patient this causes cramps  . Macrobid [Nitrofurantoin Macrocrystal]   . Nsaids     Upset stomach   . Penicillins Other (See Comments)    Has patient had a PCN reaction causing immediate rash, facial/tongue/throat swelling, SOB or lightheadedness with hypotension: No Has patient had a PCN reaction causing severe rash involving mucus membranes or skin necrosis: No Has patient had a PCN  reaction that required hospitalization No Has patient had a PCN reaction occurring within the last 10 years: No If all of the above answers are "NO", then may proceed with Cephalosporin use.   Marland Kitchen Avocado Diarrhea and Other (See Comments)    Severe cramping, sweats, and diarrhea in upper GI/stomach  . Tramadol Hcl Itching and Rash    Current Outpatient Medications on File Prior to Visit  Medication Sig  Dispense Refill  . albuterol (PROAIR HFA) 108 (90 Base) MCG/ACT inhaler Inhale 2 puffs into the lungs every 4 (four) hours as needed for wheezing. 8.5 g 11  . ARIPiprazole (ABILIFY) 15 MG tablet Take 15 mg by mouth daily.    Marland Kitchen azithromycin (ZITHROMAX Z-PAK) 250 MG tablet As directed 6 each 0  . budesonide-formoterol (SYMBICORT) 160-4.5 MCG/ACT inhaler Inhale 2 puffs into the lungs 2 (two) times daily. 1 Inhaler 11  . buPROPion (WELLBUTRIN XL) 300 MG 24 hr tablet Take 1 tablet (300 mg total) by mouth daily. 30 tablet 0  . clopidogrel (PLAVIX) 75 MG tablet Take 1 tablet (75 mg total) by mouth daily. 90 tablet 3  . doxycycline (VIBRAMYCIN) 100 MG capsule Take 1 capsule (100 mg total) by mouth 2 (two) times daily. One po bid x 7 days 14 capsule 0  . ferrous sulfate (FER-IN-SOL) 75 (15 Fe) MG/ML SOLN Take by mouth.    . gabapentin (NEURONTIN) 300 MG capsule Take 100 mg by mouth 3 (three) times daily.     . hydrOXYzine (ATARAX/VISTARIL) 50 MG tablet Take 50 mg by mouth. Take 1 tablet every night and may take 3 other times as needed    . levothyroxine (SYNTHROID, LEVOTHROID) 125 MCG tablet Take 1 tablet (125 mcg total) by mouth daily before breakfast. 90 tablet 3  . metoprolol succinate (TOPROL-XL) 25 MG 24 hr tablet Take 0.5 tablets (12.5 mg total) by mouth daily. 30 tablet 0  . Multiple Vitamin (MULTIVITAMIN) tablet Take 1 tablet by mouth daily.    . norethindrone (MICRONOR,CAMILA,ERRIN) 0.35 MG tablet     . oxyCODONE-acetaminophen (PERCOCET/ROXICET) 5-325 MG tablet Take 1 tablet by mouth every 6  (six) hours as needed for severe pain. 20 tablet 0  . pioglitazone (ACTOS) 45 MG tablet Take 1 tablet (45 mg total) by mouth daily. 30 tablet 0  . prazosin (MINIPRESS) 5 MG capsule Take 10 mg by mouth at bedtime.    . promethazine (PHENERGAN) 25 MG tablet Take 1 tablet (25 mg total) every 6 (six) hours as needed by mouth for nausea or vomiting. 10 tablet 0  . rizatriptan (MAXALT) 10 MG tablet Take 1 tablet (10 mg total) by mouth as needed for migraine. May repeat in 2 hours if needed 30 tablet 5  . rosuvastatin (CRESTOR) 20 MG tablet TAKE 1 TABLET(20 MG) BY MOUTH DAILY 30 tablet 11  . sertraline (ZOLOFT) 100 MG tablet Take 200 mg by mouth daily.    Marland Kitchen topiramate (TOPAMAX) 100 MG tablet     . traZODone (DESYREL) 150 MG tablet Take by mouth at bedtime.    . valACYclovir (VALTREX) 1000 MG tablet Take 1 tablet (1,000 mg total) by mouth 3 (three) times daily. 21 tablet 0   No current facility-administered medications on file prior to visit.     BP (!) 100/58   Temp 98.3 F (36.8 C) (Oral)   Wt 203 lb (92.1 kg)   LMP 01/23/2018 (Approximate)   BMI 37.74 kg/m       Objective:   Physical Exam  Constitutional: She appears well-developed and well-nourished. No distress.  HENT:  Right Ear: Hearing, tympanic membrane and ear canal normal.  Left Ear: Hearing and ear canal normal.  Nose: Rhinorrhea present. No mucosal edema. Right sinus exhibits maxillary sinus tenderness. Left sinus exhibits maxillary sinus tenderness.  Mouth/Throat: Uvula is midline, oropharynx is clear and moist and mucous membranes are normal.  Eyes: Pupils are equal, round, and reactive to light. Conjunctivae and EOM  are normal.  Neck: Normal range of motion. Neck supple. No thyromegaly present.  Cardiovascular: Normal rate, regular rhythm, normal heart sounds and intact distal pulses. Exam reveals no gallop and no friction rub.  No murmur heard. Pulmonary/Chest: Effort normal and breath sounds normal. No stridor. No  respiratory distress. She has no wheezes. She has no rales. She exhibits no tenderness.  Lymphadenopathy:       Head (right side): Submandibular and tonsillar adenopathy present.       Head (left side): Submandibular and tonsillar adenopathy present.    She has no cervical adenopathy.  Skin: Skin is warm and dry. Capillary refill takes less than 2 seconds. She is not diaphoretic.  Psychiatric: She has a normal mood and affect. Her behavior is normal. Judgment and thought content normal.  Nursing note and vitals reviewed.     Assessment & Plan:  1. Acute recurrent maxillary sinusitis -Advised fill and take doxycycline as directed.  Blood pressure slightly low in the office today, advised to stay hydrated and rest throughout the weekend.  If not any improvement and follow-up with PCP early next week  2. Floaters in visual field, bilateral - Advised follow up with her eye doctor    Dorothyann Peng, NP

## 2018-02-22 NOTE — Telephone Encounter (Signed)
Patient went to ED yesterday.

## 2018-02-25 ENCOUNTER — Encounter: Payer: Self-pay | Admitting: Family Medicine

## 2018-02-25 ENCOUNTER — Ambulatory Visit (INDEPENDENT_AMBULATORY_CARE_PROVIDER_SITE_OTHER): Payer: 59 | Admitting: Psychology

## 2018-02-25 ENCOUNTER — Ambulatory Visit (INDEPENDENT_AMBULATORY_CARE_PROVIDER_SITE_OTHER): Payer: Managed Care, Other (non HMO) | Admitting: Family Medicine

## 2018-02-25 VITALS — BP 100/62 | HR 71 | Temp 98.5°F | Ht 61.1 in | Wt 201.8 lb

## 2018-02-25 DIAGNOSIS — F4481 Dissociative identity disorder: Secondary | ICD-10-CM

## 2018-02-25 DIAGNOSIS — J0191 Acute recurrent sinusitis, unspecified: Secondary | ICD-10-CM

## 2018-02-25 DIAGNOSIS — F4312 Post-traumatic stress disorder, chronic: Secondary | ICD-10-CM | POA: Diagnosis not present

## 2018-02-25 MED ORDER — VALACYCLOVIR HCL 1 G PO TABS: 1000.0000 mg | ORAL_TABLET | Freq: Three times a day (TID) | ORAL | 5 refills | Status: DC

## 2018-02-25 NOTE — Progress Notes (Signed)
   Subjective:    Patient ID: Miranda [name], female    DOB: 1975/11/22, 42 y.o.   MRN: [id number]  HPI Here to follow up a sinus infection. On 02-18-18 she was given a Zpack, then on 02-22-18 she was placed on Doxycycline. Now she is slowly getting better but still has a lot of sinus congestion and and she is coughing up yellow sputum. The fevers are gone.    Review of Systems  Constitutional: Negative.   HENT: Positive for congestion, postnasal drip, sinus pressure and sinus pain. Negative for sore throat.   Eyes: Negative.   Respiratory: Positive for cough.        Objective:   Physical Exam  Constitutional: She appears well-developed and well-nourished.  HENT:  Right Ear: External ear normal.  Left Ear: External ear normal.  Mouth/Throat: Oropharynx is clear and moist.  Eyes: Conjunctivae are normal.  Neck: No thyromegaly present.  Pulmonary/Chest: Effort normal and breath sounds normal.  Lymphadenopathy:    She has no cervical adenopathy.          Assessment & Plan:  Partially treated sinusitis. Finish up the Doxycycline. Written out of work from 02-23-18 until  04-05-18.  Alysia Penna, MD

## 2018-02-28 ENCOUNTER — Ambulatory Visit (INDEPENDENT_AMBULATORY_CARE_PROVIDER_SITE_OTHER): Payer: 59 | Admitting: Psychology

## 2018-02-28 ENCOUNTER — Telehealth: Payer: Self-pay | Admitting: Family Medicine

## 2018-02-28 ENCOUNTER — Telehealth: Payer: Self-pay | Admitting: *Deleted

## 2018-02-28 DIAGNOSIS — F4481 Dissociative identity disorder: Secondary | ICD-10-CM

## 2018-02-28 DIAGNOSIS — F4312 Post-traumatic stress disorder, chronic: Secondary | ICD-10-CM

## 2018-02-28 NOTE — Telephone Encounter (Signed)
From what I've read it sounds like her neurologist is likely trying to stop things that might be contributing to her complaints of 'hallucinating.'  I would encourage her to keep her regular follow-up with her PCP to continue investigating this, continue the gabapentin, and start therapy at [facility] for her shoulder/neck/upper back.  I don't think she needs to see [place] sooner for the neck/back otherwise.

## 2018-02-28 NOTE — Telephone Encounter (Signed)
Patient called and said neurology told her to stop taking the Tizanidine. Patient stated that she is also taking only 100 mg 3 times a day of the Gabapentin. Has a follow up at the end of the month and wants to know if she should come in earlier.

## 2018-02-28 NOTE — Telephone Encounter (Signed)
Copied from [place] [id number]. Topic: Quick Communication - See Telephone Encounter >> Feb 28, 2018  4:06 PM Rutherford Nail, NT wrote: CRM for notification. See Telephone encounter for: 02/28/18. Patient states that Dr Sarajane Jews wrote her a doctors note on Monday to return to work Tuesday(03/05/18). Patient states that she is still hallucinating, has some generalized weakness, some congestion, continuous headache for 14 days, and is sleeping 80% of the day. Patient states she does not for see herself going into work on Tuesday morning if this continues. States she wanted to let dr Sarajane Jews know how she was doing and to see if he thinks she needs to be seen again. Please advise. CB#: [phone]

## 2018-03-01 NOTE — Telephone Encounter (Signed)
Patient was informed and did not have any questions at this time

## 2018-03-01 NOTE — Telephone Encounter (Signed)
Sent to PCP to advise 

## 2018-03-01 NOTE — Telephone Encounter (Signed)
Have her come in to see [place] next Monday May 6

## 2018-03-01 NOTE — Telephone Encounter (Signed)
Called and spoke with pt. Pt is scheduled for an appt

## 2018-03-05 ENCOUNTER — Encounter: Payer: Self-pay | Admitting: Family Medicine

## 2018-03-05 ENCOUNTER — Ambulatory Visit (INDEPENDENT_AMBULATORY_CARE_PROVIDER_SITE_OTHER): Payer: Managed Care, Other (non HMO) | Admitting: Family Medicine

## 2018-03-05 VITALS — BP 120/80 | HR 78 | Temp 98.3°F | Ht 61.0 in | Wt 200.4 lb

## 2018-03-05 DIAGNOSIS — J019 Acute sinusitis, unspecified: Secondary | ICD-10-CM | POA: Diagnosis not present

## 2018-03-05 NOTE — Progress Notes (Signed)
   Subjective:    Patient ID: Miranda [name], female    DOB: 1976-04-12, 42 y.o.   MRN: [id number]  HPI Here to follow up a sinusitis. She has finished the Doxycycline and she feels better. She still has a mild headache. She has met with Jenny Reichmann O'Holloaran who is a PT working through a [employer], and he has her doing exercises to strengthen her back and right arm and leg. She is scheduled to meet with Teodora Medici PA for the first time on 04-09-18, and she works at the [employer] in [place].    Review of Systems  Constitutional: Negative.   HENT: Negative.   Respiratory: Negative.   Cardiovascular: Negative.   Neurological: Positive for weakness and headaches.       Objective:   Physical Exam  Constitutional:  Walks with a cane, she looks much better today   HENT:  Right Ear: External ear normal.  Left Ear: External ear normal.  Nose: Nose normal.  Mouth/Throat: Oropharynx is clear and moist.  Eyes: Conjunctivae are normal.  Neck: No thyromegaly present.  Cardiovascular: Normal rate, regular rhythm, normal heart sounds and intact distal pulses.  Pulmonary/Chest: Effort normal and breath sounds normal. No stridor. No respiratory distress. She has no wheezes. She has no rales.          Assessment & Plan:  She has recovered from a sinusitis. She plans to return to work on 03-07-18.  Alysia Penna, MD

## 2018-03-07 ENCOUNTER — Encounter: Payer: Self-pay | Admitting: Family Medicine

## 2018-03-07 ENCOUNTER — Ambulatory Visit (INDEPENDENT_AMBULATORY_CARE_PROVIDER_SITE_OTHER): Payer: 59 | Admitting: Psychology

## 2018-03-07 DIAGNOSIS — F4312 Post-traumatic stress disorder, chronic: Secondary | ICD-10-CM | POA: Diagnosis not present

## 2018-03-07 DIAGNOSIS — F4481 Dissociative identity disorder: Secondary | ICD-10-CM

## 2018-03-07 LAB — HM DIABETES EYE EXAM

## 2018-03-11 ENCOUNTER — Ambulatory Visit (INDEPENDENT_AMBULATORY_CARE_PROVIDER_SITE_OTHER): Payer: 59 | Admitting: Psychology

## 2018-03-11 DIAGNOSIS — F4312 Post-traumatic stress disorder, chronic: Secondary | ICD-10-CM | POA: Diagnosis not present

## 2018-03-11 DIAGNOSIS — F4481 Dissociative identity disorder: Secondary | ICD-10-CM | POA: Diagnosis not present

## 2018-03-14 ENCOUNTER — Ambulatory Visit (INDEPENDENT_AMBULATORY_CARE_PROVIDER_SITE_OTHER): Payer: 59 | Admitting: Psychology

## 2018-03-14 DIAGNOSIS — F4481 Dissociative identity disorder: Secondary | ICD-10-CM

## 2018-03-14 DIAGNOSIS — F4312 Post-traumatic stress disorder, chronic: Secondary | ICD-10-CM | POA: Diagnosis not present

## 2018-03-18 ENCOUNTER — Ambulatory Visit (INDEPENDENT_AMBULATORY_CARE_PROVIDER_SITE_OTHER): Payer: 59 | Admitting: Psychology

## 2018-03-18 DIAGNOSIS — F4481 Dissociative identity disorder: Secondary | ICD-10-CM | POA: Diagnosis not present

## 2018-03-18 DIAGNOSIS — F4312 Post-traumatic stress disorder, chronic: Secondary | ICD-10-CM | POA: Diagnosis not present

## 2018-03-19 ENCOUNTER — Other Ambulatory Visit: Payer: Self-pay | Admitting: Family Medicine

## 2018-03-21 ENCOUNTER — Ambulatory Visit (INDEPENDENT_AMBULATORY_CARE_PROVIDER_SITE_OTHER): Payer: 59 | Admitting: Psychology

## 2018-03-21 DIAGNOSIS — F4481 Dissociative identity disorder: Secondary | ICD-10-CM | POA: Diagnosis not present

## 2018-03-21 DIAGNOSIS — F4312 Post-traumatic stress disorder, chronic: Secondary | ICD-10-CM | POA: Diagnosis not present

## 2018-03-28 ENCOUNTER — Ambulatory Visit (INDEPENDENT_AMBULATORY_CARE_PROVIDER_SITE_OTHER): Payer: Managed Care, Other (non HMO) | Admitting: Family Medicine

## 2018-03-28 ENCOUNTER — Ambulatory Visit: Payer: 59 | Admitting: Psychology

## 2018-03-28 ENCOUNTER — Encounter: Payer: Self-pay | Admitting: Family Medicine

## 2018-03-28 DIAGNOSIS — M542 Cervicalgia: Secondary | ICD-10-CM

## 2018-03-28 DIAGNOSIS — M654 Radial styloid tenosynovitis [de Quervain]: Secondary | ICD-10-CM

## 2018-03-28 NOTE — Patient Instructions (Signed)
Continue with the physical therapy for your neck and shoulder.  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 Avoid painful activities as much as possible. Wear the thumb spica brace as often as possible to rest this. Ice 15 minutes at a time 3-4 times a day. A cortisone injection typically helps a great deal with this and is an option. Physical therapy for iontophoresis is another consideration. Follow up with me in 6 weeks for reevaluation but call me sooner if you're not improving as expected and we can do the injection.

## 2018-04-01 ENCOUNTER — Ambulatory Visit (INDEPENDENT_AMBULATORY_CARE_PROVIDER_SITE_OTHER): Payer: Self-pay | Admitting: Psychology

## 2018-04-01 DIAGNOSIS — F4481 Dissociative identity disorder: Secondary | ICD-10-CM

## 2018-04-01 DIAGNOSIS — F4312 Post-traumatic stress disorder, chronic: Secondary | ICD-10-CM

## 2018-04-02 ENCOUNTER — Encounter: Payer: Self-pay | Admitting: Family Medicine

## 2018-04-02 DIAGNOSIS — M654 Radial styloid tenosynovitis [de Quervain]: Secondary | ICD-10-CM | POA: Insufficient documentation

## 2018-04-02 NOTE — Assessment & Plan Note (Signed)
MRI reassuring.  NCV results showed moderate right carpal tunnel but current neck/shoulder pain not related.  Secondary to spasms/strain upper back, trapezius, scapular stabilizers.  Continue with physical therapy, home exercises.  Tizanidine, gabapentin.  F/u in 6 weeks.

## 2018-04-02 NOTE — Progress Notes (Signed)
PCP: Laurey Morale, MD  Subjective:   HPI: Patient is a 42 y.o. female here for bilateral shoulder, neck pain.  3/4: Patient reports about 4 months ago she started to get pain in left shoulder. Was referred to orthopedics, evaluated, sent for physical therapy for cervical radiculopathy. MRI was reassuring but nerve conduction studies showed carpal tunnel so advised to wear wrist braces. She reports having pain posterior neck into both shoulders. Pain worse with turning head to the side, especially to the right. Pain level 4/10. Continues doing physical therapy at [facility]. Associated numbness into both hands but more into the right, especially into 4th and 5th digits.  Sometimes into 1st, 2nd digits. No bowel/bladder dysfunction.  4/17: Patient reports overall she's doing well. 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 Physical therapy visits are very expensive so interested in trying a different location that will take her insurance. Doing home exercises. Shoulders feel tight posteriorly. Taking tizanidine twice a day with gabapentin 200 tid. Pain also worse with prolonged standing and sitting. No skin changes.  5/30: Patient reports her neck and shoulder are doing well. Pain worse in left posterior shoulder area and upper back in the morning, when sleeping on left side. Doing physical therapy and aquatic therapy twice a week. Doing home exercises. Feels more steady now too. Pain up to 6/10 and sharp now in her right hand and wrist/thumb. Worse with trying to open doors. No acute injury. No skin changes, numbness.  Past Medical History:  Diagnosis Date  . Anxiety   . Arthritis   . Asthma   . Bowel obstruction ([place])   . Cardiac arrhythmia   . Depression    sees Dr. Matilde Haymaker in [place]   . Diabetes mellitus    type 2, on insulin pump, sees Dr. Loanne Drilling   . GERD (gastroesophageal reflux disease)   . Hyperlipidemia    . Hypertension   . Hypothyroidism    sees Dr. Elyse Hsu  . Insomnia   . Migraine syndrome   . Morbid obesity ([place])   . 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 Neck pain   . Neuropathy associated with endocrine disorder ([place])   . Sleep apnea with use of continuous positive airway pressure (CPAP)    Sleep apnea is resolved due to weight loss. 08/2017  . 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 Stroke [facility]) 04/2016    Current Outpatient Medications on File Prior to Visit  Medication Sig Dispense Refill  . albuterol (PROAIR HFA) 108 (90 Base) MCG/ACT inhaler Inhale 2 puffs into the lungs every 4 (four) hours as needed for wheezing. 8.5 g 11  . ARIPiprazole (ABILIFY) 15 MG tablet Take 15 mg by mouth daily.    . budesonide-formoterol (SYMBICORT) 160-4.5 MCG/ACT inhaler Inhale 2 puffs into the lungs 2 (two) times daily. 1 Inhaler 11  . buPROPion (WELLBUTRIN XL) 300 MG 24 hr tablet Take 1 tablet (300 mg total) by mouth daily. 30 tablet 0  . clopidogrel (PLAVIX) 75 MG tablet Take 1 tablet (75 mg total) by mouth daily. 90 tablet 3  . ferrous sulfate (FER-IN-SOL) 75 (15 Fe) MG/ML SOLN Take by mouth.    . gabapentin (NEURONTIN) 100 MG capsule Take 100 mg by mouth 3 (three) times daily.    Marland Kitchen HYDROcodone-homatropine (HYCODAN) 5-1.5 MG/5ML syrup Take 5 mLs by mouth every 6 (six) hours as needed for cough.    . hydrOXYzine (ATARAX/VISTARIL) 50 MG  tablet Take 50 mg by mouth. Take 1 tablet every night and may take 3 other times as needed    . levothyroxine (SYNTHROID, LEVOTHROID) 125 MCG tablet Take 1 tablet (125 mcg total) by mouth daily before breakfast. 90 tablet 3  . metoprolol succinate (TOPROL-XL) 25 MG 24 hr tablet Take 0.5 tablets (12.5 mg total) by mouth daily. 30 tablet 0  . Multiple Vitamin (MULTIVITAMIN) tablet Take 1 tablet by mouth daily.    . norethindrone (MICRONOR,CAMILA,ERRIN) 0.35 MG tablet     . oxyCODONE-acetaminophen (PERCOCET/ROXICET) 5-325 MG tablet Take 1 tablet by mouth every 6 (six) hours  as needed for severe pain. 20 tablet 0  . pioglitazone (ACTOS) 45 MG tablet Take 1 tablet (45 mg total) by mouth daily. 30 tablet 0  . prazosin (MINIPRESS) 5 MG capsule Take 10 mg by mouth at bedtime.    . promethazine (PHENERGAN) 25 MG tablet Take 1 tablet (25 mg total) every 6 (six) hours as needed by mouth for nausea or vomiting. 10 tablet 0  . rizatriptan (MAXALT) 10 MG tablet Take 1 tablet (10 mg total) by mouth as needed for migraine. May repeat in 2 hours if needed 30 tablet 5  . rosuvastatin (CRESTOR) 20 MG tablet TAKE 1 TABLET(20 MG) BY MOUTH DAILY 30 tablet 11  . sertraline (ZOLOFT) 100 MG tablet Take 200 mg by mouth daily.    Marland Kitchen topiramate (TOPAMAX) 100 MG tablet     . traZODone (DESYREL) 150 MG tablet Take by mouth at bedtime.    . valACYclovir (VALTREX) 1000 MG tablet Take 1 tablet (1,000 mg total) by mouth 3 (three) times daily. 21 tablet 5   No current facility-administered medications on file prior to visit.     Past Surgical History:  Procedure Laterality Date  . blocked itestinal repair  34   age 23 months  . BREAST BIOPSY Left 2017  . CHOLECYSTECTOMY  2011  . INDUCED ABORTION  1996   forced abortion  . INGUINAL HERNIA REPAIR Left 1980   age 16  . LAPAROSCOPIC ENDOMETRIOSIS FULGURATION  1998  . WISDOM TOOTH EXTRACTION  2007    Allergies  Allergen Reactions  . Macrobid [Nitrofurantoin] Hives  . Byetta 10 Mcg Pen [Exenatide] Hives  . Clindamycin/Lincomycin Nausea And Vomiting  . Geodon [Ziprasidone Hcl]   . Lipitor [Atorvastatin]     Per patient this causes cramps  . Macrobid [Nitrofurantoin Macrocrystal]   . Nsaids     Upset stomach   . Penicillins Other (See Comments)    Has patient had a PCN reaction causing immediate rash, facial/tongue/throat swelling, SOB or lightheadedness with hypotension: No Has patient had a PCN reaction causing severe rash involving mucus membranes or skin necrosis: No Has patient had a PCN reaction that required hospitalization  No Has patient had a PCN reaction occurring within the last 10 years: No If all of the above answers are "NO", then may proceed with Cephalosporin use.   Marland Kitchen Avocado Diarrhea and Other (See Comments)    Severe cramping, sweats, and diarrhea in upper GI/stomach  . Tramadol Hcl Itching and Rash    Social History   Socioeconomic History  . Marital status: Divorced    Spouse name: Not on file  . Number of children: 5  . Years of education: Not on file  . Highest education level: Not on file  Occupational History  . Occupation: Nurse, learning disability  Social Needs  . Financial resource strain: Not on file  . Food insecurity:  Worry: Not on file    Inability: Not on file  . Transportation needs:    Medical: Not on file    Non-medical: Not on file  Tobacco Use  . Smoking status: Former Smoker    Packs/day: 0.50    Years: 0.50    Pack years: 0.25    Types: Cigarettes    Start date: 02/24/2018  . Smokeless tobacco: Never Used  . Tobacco comment: quit 03/25/2016  Substance and Sexual Activity  . Alcohol use: No    Alcohol/week: 0.0 oz  . Drug use: Yes    Types: Marijuana    Comment: 2/10 last marijuana use  . Sexual activity: Not on file  Lifestyle  . Physical activity:    Days per week: Not on file    Minutes per session: Not on file  . Stress: Not on file  Relationships  . Social connections:    Talks on phone: Not on file    Gets together: Not on file    Attends religious service: Not on file    Active member of club or organization: Not on file    Attends meetings of clubs or organizations: Not on file    Relationship status: Not on file  . Intimate partner violence:    Fear of current or ex partner: Not on file    Emotionally abused: Not on file    Physically abused: Not on file    Forced sexual activity: Not on file  Other Topics Concern  . Not on file  Social History Narrative  . Not on file    Family History  Adopted: Yes  Problem Relation Age  of Onset  . Heart disease Unknown        on both sides of family  . Diabetes Unknown        on both sides of family    BP (!) 84/54   Pulse 80   Ht 5\' 2"  (1.575 m)   Wt 200 lb (90.7 kg)   BMI 36.58 kg/m   Review of Systems: See HPI above.     Objective:  Physical Exam:  Gen: NAD, comfortable in exam room  Neck: No gross deformity, swelling, bruising. TTP bilateral paraspinal cervical regions, medial to scapulae.  No midline/bony TTP. FROM with pain on flexion, extension. BUE strength 5/5.   Sensation intact to light touch.   2+ equal reflexes in triceps, biceps, brachioradialis tendons. NV intact distal BUEs.  Right hand/wrist: No deformity, swelling, bruising.Marland Kitchen FROM with 5/5 strength but pain on thumb motions. Tenderness to palpation 1st dorsal compartment.  No carpal tunnel, 1st CMC tenderness. NVI distally. Negative tinels and phalens. Positive finkelsteins.  Assessment & Plan:  1. Neck, bilateral shoulder pain - MRI reassuring.  NCV results showed moderate right carpal tunnel but current neck/shoulder pain not related.  Secondary to spasms/strain upper back, trapezius, scapular stabilizers.  Continue with physical therapy, home exercises.  Tizanidine, gabapentin.  F/u in 6 weeks.  2. Right wrist pain - 2/2 de quervain's tenosynovitis.  Thumb spica brace.  Icing.  Consider injection, PT with iontophoresis.  F/u in 6 weeks.

## 2018-04-02 NOTE — Assessment & Plan Note (Signed)
Thumb spica brace.  Icing.  Consider injection, PT with iontophoresis.  F/u in 6 weeks.

## 2018-04-03 ENCOUNTER — Telehealth: Payer: Self-pay | Admitting: Family Medicine

## 2018-04-03 NOTE — Telephone Encounter (Signed)
Patient requesting Rx for Gabapentin, 100MG   Pharmacy: [facility] on [address]

## 2018-04-04 MED ORDER — GABAPENTIN 100 MG PO CAPS
100.0000 mg | ORAL_CAPSULE | Freq: Three times a day (TID) | ORAL | 1 refills | Status: DC
Start: 2018-04-04 — End: 2018-06-28

## 2018-04-04 NOTE — Telephone Encounter (Signed)
I sent it in for her but I don't think I'm the one who has prescribed it.  Looks like the person who put it in last month was [facility], a CMA but I can't tell from whose office.

## 2018-04-05 NOTE — Telephone Encounter (Signed)
Patient was informed of Rx being sent to [facility]

## 2018-04-08 ENCOUNTER — Ambulatory Visit (INDEPENDENT_AMBULATORY_CARE_PROVIDER_SITE_OTHER): Payer: Self-pay | Admitting: Psychology

## 2018-04-08 DIAGNOSIS — F4312 Post-traumatic stress disorder, chronic: Secondary | ICD-10-CM

## 2018-04-08 DIAGNOSIS — F4481 Dissociative identity disorder: Secondary | ICD-10-CM

## 2018-04-11 ENCOUNTER — Ambulatory Visit (INDEPENDENT_AMBULATORY_CARE_PROVIDER_SITE_OTHER): Payer: Self-pay | Admitting: Psychology

## 2018-04-11 DIAGNOSIS — F4312 Post-traumatic stress disorder, chronic: Secondary | ICD-10-CM

## 2018-04-11 DIAGNOSIS — F4481 Dissociative identity disorder: Secondary | ICD-10-CM

## 2018-04-15 ENCOUNTER — Ambulatory Visit (INDEPENDENT_AMBULATORY_CARE_PROVIDER_SITE_OTHER): Payer: Self-pay | Admitting: Psychology

## 2018-04-15 DIAGNOSIS — F4481 Dissociative identity disorder: Secondary | ICD-10-CM

## 2018-04-15 DIAGNOSIS — F4312 Post-traumatic stress disorder, chronic: Secondary | ICD-10-CM

## 2018-04-18 ENCOUNTER — Ambulatory Visit (INDEPENDENT_AMBULATORY_CARE_PROVIDER_SITE_OTHER): Payer: Self-pay | Admitting: Psychology

## 2018-04-18 DIAGNOSIS — F4481 Dissociative identity disorder: Secondary | ICD-10-CM

## 2018-04-18 DIAGNOSIS — F4312 Post-traumatic stress disorder, chronic: Secondary | ICD-10-CM

## 2018-04-19 ENCOUNTER — Ambulatory Visit (INDEPENDENT_AMBULATORY_CARE_PROVIDER_SITE_OTHER): Payer: Self-pay | Admitting: Family Medicine

## 2018-04-19 ENCOUNTER — Encounter: Payer: Self-pay | Admitting: Family Medicine

## 2018-04-19 ENCOUNTER — Ambulatory Visit: Payer: Self-pay | Admitting: Family Medicine

## 2018-04-19 VITALS — BP 120/70 | HR 72 | Temp 98.5°F | Ht 62.0 in | Wt 211.8 lb

## 2018-04-19 DIAGNOSIS — J029 Acute pharyngitis, unspecified: Secondary | ICD-10-CM

## 2018-04-19 DIAGNOSIS — J0191 Acute recurrent sinusitis, unspecified: Secondary | ICD-10-CM

## 2018-04-19 LAB — POCT RAPID STREP A (OFFICE): RAPID STREP A SCREEN: NEGATIVE

## 2018-04-19 MED ORDER — DOXYCYCLINE HYCLATE 100 MG PO CAPS: 100.0000 mg | ORAL_CAPSULE | Freq: Two times a day (BID) | ORAL | 0 refills | Status: AC

## 2018-04-19 NOTE — Progress Notes (Signed)
   Subjective:    Patient ID: Miranda [name], female    DOB: 03-24-1976, 42 y.o.   MRN: [id number]  HPI Here for 4 days of sinus pressure, PND, ST, and a dry cough. No fever. She is worried because she was exposed to a coworker with a strep throat infection earlier this week.    Review of Systems  Constitutional: Negative.   HENT: Positive for postnasal drip, sinus pressure and sore throat. Negative for congestion, ear pain and sinus pain.   Eyes: Negative.   Respiratory: Positive for cough.        Objective:   Physical Exam  Constitutional: She appears well-developed and well-nourished.  HENT:  Right Ear: External ear normal.  Left Ear: External ear normal.  Nose: Nose normal.  Posterior OP is red without exudate   Eyes: Conjunctivae are normal.  Neck: No thyromegaly present.  Tender shotty AC nodes   Pulmonary/Chest: Effort normal and breath sounds normal. No stridor. No respiratory distress. She has no wheezes. She has no rales.          Assessment & Plan:  Sinusitis, treat with Doxycycline.  Alysia Penna, MD

## 2018-04-22 ENCOUNTER — Ambulatory Visit (INDEPENDENT_AMBULATORY_CARE_PROVIDER_SITE_OTHER): Payer: Self-pay | Admitting: Psychology

## 2018-04-22 ENCOUNTER — Encounter: Payer: Self-pay | Admitting: Family Medicine

## 2018-04-22 DIAGNOSIS — F4481 Dissociative identity disorder: Secondary | ICD-10-CM

## 2018-04-22 DIAGNOSIS — F4312 Post-traumatic stress disorder, chronic: Secondary | ICD-10-CM

## 2018-04-23 ENCOUNTER — Ambulatory Visit (INDEPENDENT_AMBULATORY_CARE_PROVIDER_SITE_OTHER): Payer: Self-pay | Admitting: Psychology

## 2018-04-23 DIAGNOSIS — F4312 Post-traumatic stress disorder, chronic: Secondary | ICD-10-CM

## 2018-04-23 DIAGNOSIS — F4481 Dissociative identity disorder: Secondary | ICD-10-CM

## 2018-04-25 ENCOUNTER — Ambulatory Visit (INDEPENDENT_AMBULATORY_CARE_PROVIDER_SITE_OTHER): Payer: Self-pay | Admitting: Psychology

## 2018-04-25 DIAGNOSIS — F4481 Dissociative identity disorder: Secondary | ICD-10-CM

## 2018-04-25 DIAGNOSIS — F4312 Post-traumatic stress disorder, chronic: Secondary | ICD-10-CM

## 2018-04-29 ENCOUNTER — Ambulatory Visit (INDEPENDENT_AMBULATORY_CARE_PROVIDER_SITE_OTHER): Payer: 59 | Admitting: Psychology

## 2018-04-29 DIAGNOSIS — F4481 Dissociative identity disorder: Secondary | ICD-10-CM | POA: Diagnosis not present

## 2018-04-29 DIAGNOSIS — F4312 Post-traumatic stress disorder, chronic: Secondary | ICD-10-CM | POA: Diagnosis not present

## 2018-05-01 ENCOUNTER — Ambulatory Visit (INDEPENDENT_AMBULATORY_CARE_PROVIDER_SITE_OTHER): Payer: 59 | Admitting: Psychology

## 2018-05-01 DIAGNOSIS — F4312 Post-traumatic stress disorder, chronic: Secondary | ICD-10-CM

## 2018-05-01 DIAGNOSIS — F4481 Dissociative identity disorder: Secondary | ICD-10-CM

## 2018-05-06 ENCOUNTER — Ambulatory Visit (INDEPENDENT_AMBULATORY_CARE_PROVIDER_SITE_OTHER): Payer: 59 | Admitting: Psychology

## 2018-05-06 DIAGNOSIS — F4312 Post-traumatic stress disorder, chronic: Secondary | ICD-10-CM

## 2018-05-06 DIAGNOSIS — F4481 Dissociative identity disorder: Secondary | ICD-10-CM

## 2018-05-09 ENCOUNTER — Ambulatory Visit (INDEPENDENT_AMBULATORY_CARE_PROVIDER_SITE_OTHER): Payer: Managed Care, Other (non HMO) | Admitting: Family Medicine

## 2018-05-09 ENCOUNTER — Encounter: Payer: Self-pay | Admitting: Family Medicine

## 2018-05-09 ENCOUNTER — Ambulatory Visit (INDEPENDENT_AMBULATORY_CARE_PROVIDER_SITE_OTHER): Payer: 59 | Admitting: Psychology

## 2018-05-09 DIAGNOSIS — F4312 Post-traumatic stress disorder, chronic: Secondary | ICD-10-CM | POA: Diagnosis not present

## 2018-05-09 DIAGNOSIS — G8929 Other chronic pain: Secondary | ICD-10-CM

## 2018-05-09 DIAGNOSIS — M25512 Pain in left shoulder: Secondary | ICD-10-CM

## 2018-05-09 DIAGNOSIS — M654 Radial styloid tenosynovitis [de Quervain]: Secondary | ICD-10-CM | POA: Diagnosis not present

## 2018-05-09 DIAGNOSIS — F4481 Dissociative identity disorder: Secondary | ICD-10-CM

## 2018-05-09 DIAGNOSIS — M25511 Pain in right shoulder: Secondary | ICD-10-CM | POA: Diagnosis not present

## 2018-05-09 NOTE — Patient Instructions (Signed)
Let me know if you need anything. Continue with the thumb spica brace for your thumb, ice this if needed. Do home exercises/stretches for your back - Consider doing right before bed then first thing in the morning. Let me know when the insurance gets straightened out if you need a new referral to physical therapy. Follow up with me as needed but call me with any questions or concerns.

## 2018-05-13 ENCOUNTER — Encounter: Payer: Self-pay | Admitting: Family Medicine

## 2018-05-13 ENCOUNTER — Ambulatory Visit: Payer: Self-pay | Admitting: Psychology

## 2018-05-13 NOTE — Assessment & Plan Note (Signed)
Continue thumb spica with icing as needed.  F/u prn.

## 2018-05-13 NOTE — Progress Notes (Signed)
PCP: Laurey Morale, MD  Subjective:   HPI: Patient is a 42 y.o. female here for neck/shoulder pain and right thumb pain.  3/4: Patient reports about 4 months ago she started to get pain in left shoulder. Was referred to orthopedics, evaluated, sent for physical therapy for cervical radiculopathy. MRI was reassuring but nerve conduction studies showed carpal tunnel so advised to wear wrist braces. She reports having pain posterior neck into both shoulders. Pain worse with turning head to the side, especially to the right. Pain level 4/10. Continues doing physical therapy at [facility]. Associated numbness into both hands but more into the right, especially into 4th and 5th digits.  Sometimes into 1st, 2nd digits. No bowel/bladder dysfunction.  4/17: Patient reports overall she's doing well. 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 Physical therapy visits are very expensive so interested in trying a different location that will take her insurance. Doing home exercises. Shoulders feel tight posteriorly. Taking tizanidine twice a day with gabapentin 200 tid. Pain also worse with prolonged standing and sitting. No skin changes.  5/30: Patient reports her neck and shoulder are doing well. Pain worse in left posterior shoulder area and upper back in the morning, when sleeping on left side. Doing physical therapy and aquatic therapy twice a week. Doing home exercises. Feels more steady now too. Pain up to 6/10 and sharp now in her right hand and wrist/thumb. Worse with trying to open doors. No acute injury. No skin changes, numbness.  7/11: Patient reports her right thumb/wrist is responding well to the thumb spica brace. She takes oxycodone as needed. Pain level 3/10, worse with motion and if not wearing brace. Neck/upper back and between shoulder blades pain is 5/10, can be excruciating. Cannot do PT now because of insurance  reimbursement issue - waiting for this to get cleared up. No skin changes, numbness. 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  Past Medical History:  Diagnosis Date  . Anxiety   . Arthritis   . Asthma   . Bowel obstruction ([place])   . Cardiac arrhythmia   . Depression    sees Dr. Matilde Haymaker in [place]   . Diabetes mellitus    type 2, on insulin pump, sees Dr. Loanne Drilling   . GERD (gastroesophageal reflux disease)   . Hyperlipidemia   . Hypertension   . Hypothyroidism    sees Dr. Elyse Hsu  . Insomnia   . Migraine syndrome   . Morbid obesity ([place])   . 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 Neck pain   . Neuropathy associated with endocrine disorder ([place])   . Sleep apnea with use of continuous positive airway pressure (CPAP)    Sleep apnea is resolved due to weight loss. 08/2017  . 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 Stroke [facility]) 04/2016    Current Outpatient Medications on File Prior to Visit  Medication Sig Dispense Refill  . albuterol (PROAIR HFA) 108 (90 Base) MCG/ACT inhaler Inhale 2 puffs into the lungs every 4 (four) hours as needed for wheezing. 8.5 g 11  . ARIPiprazole (ABILIFY) 15 MG tablet Take 15 mg by mouth daily.    . budesonide-formoterol (SYMBICORT) 160-4.5 MCG/ACT inhaler Inhale 2 puffs into the lungs 2 (two) times daily. 1 Inhaler 11  . buPROPion (WELLBUTRIN XL) 300 MG 24 hr tablet Take 1 tablet (300 mg total) by mouth daily. [place]  tablet 0  . clopidogrel (PLAVIX) 75 MG tablet Take 1 tablet (75 mg total) by mouth daily. 90 tablet 3  . cycloSPORINE (RESTASIS) 0.05 % ophthalmic emulsion Restasis 0.05 % eye drops in a dropperette    . ferrous sulfate (FER-IN-SOL) 75 (15 Fe) MG/ML SOLN Take by mouth.    . gabapentin (NEURONTIN) 100 MG capsule Take 1 capsule (100 mg total) by mouth 3 (three) times daily. 90 capsule 1  . hydrOXYzine (ATARAX/VISTARIL) 50 MG tablet Take 50 mg by mouth. Take 1 tablet  every night and may take 3 other times as needed    . insulin aspart (NOVOLOG) 100 UNIT/ML injection Inject into the skin.    Marland Kitchen levothyroxine (SYNTHROID, LEVOTHROID) 125 MCG tablet Take 1 tablet (125 mcg total) by mouth daily before breakfast. 90 tablet 3  . metoprolol succinate (TOPROL-XL) 25 MG 24 hr tablet Take 0.5 tablets (12.5 mg total) by mouth daily. 30 tablet 0  . Multiple Vitamin (MULTIVITAMIN) tablet Take 1 tablet by mouth daily.    . norethindrone (MICRONOR,CAMILA,ERRIN) 0.35 MG tablet     . oxyCODONE-acetaminophen (PERCOCET/ROXICET) 5-325 MG tablet Take 1 tablet by mouth every 6 (six) hours as needed for severe pain. 20 tablet 0  . pioglitazone (ACTOS) 45 MG tablet Take 1 tablet (45 mg total) by mouth daily. 30 tablet 0  . prazosin (MINIPRESS) 5 MG capsule Take 10 mg by mouth at bedtime.    . promethazine (PHENERGAN) 25 MG tablet Take 1 tablet (25 mg total) every 6 (six) hours as needed by mouth for nausea or vomiting. 10 tablet 0  . rizatriptan (MAXALT) 10 MG tablet Take 1 tablet (10 mg total) by mouth as needed for migraine. May repeat in 2 hours if needed 30 tablet 5  . rosuvastatin (CRESTOR) 20 MG tablet TAKE 1 TABLET(20 MG) BY MOUTH DAILY 30 tablet 11  . sertraline (ZOLOFT) 100 MG tablet Take 200 mg by mouth daily.    Marland Kitchen topiramate (TOPAMAX) 100 MG tablet     . traZODone (DESYREL) 150 MG tablet Take by mouth at bedtime.    . valACYclovir (VALTREX) 1000 MG tablet Take 1 tablet (1,000 mg total) by mouth 3 (three) times daily. (Patient not taking: Reported on 04/19/2018) 21 tablet 5   No current facility-administered medications on file prior to visit.     Past Surgical History:  Procedure Laterality Date  . blocked itestinal repair  65   age 18 months  . BREAST BIOPSY Left 2017  . CHOLECYSTECTOMY  2011  . INDUCED ABORTION  1996   forced abortion  . INGUINAL HERNIA REPAIR Left 1980   age 71  . LAPAROSCOPIC ENDOMETRIOSIS FULGURATION  1998  . WISDOM TOOTH EXTRACTION  2007     Allergies  Allergen Reactions  . Macrobid [Nitrofurantoin] Hives  . Byetta 10 Mcg Pen [Exenatide] Hives  . Clindamycin/Lincomycin Nausea And Vomiting  . Geodon [Ziprasidone Hcl]   . Lipitor [Atorvastatin]     Per patient this causes cramps  . Nsaids     Upset stomach   . Penicillins Other (See Comments)    Has patient had a PCN reaction causing immediate rash, facial/tongue/throat swelling, SOB or lightheadedness with hypotension: No Has patient had a PCN reaction causing severe rash involving mucus membranes or skin necrosis: No Has patient had a PCN reaction that required hospitalization No Has patient had a PCN reaction occurring within the last 10 years: No If all of the above answers are "NO", then may proceed with Cephalosporin  use.   . Avocado Diarrhea and Other (See Comments)    Severe cramping, sweats, and diarrhea in upper GI/stomach  . Hydrocodone-Acetaminophen Nausea And Vomiting and Nausea Only    Lethargy Other reaction(s): Nausea and Vomiting, Sedation Lethargy  . Loracarbef Nausea Only  . Tramadol Hcl Itching and Rash    Social History   Socioeconomic History  . Marital status: Divorced    Spouse name: Not on file  . Number of children: 5  . Years of education: Not on file  . Highest education level: Not on file  Occupational History  . Occupation: Nurse, learning disability  Social Needs  . Financial resource strain: Not on file  . Food insecurity:    Worry: Not on file    Inability: Not on file  . Transportation needs:    Medical: Not on file    Non-medical: Not on file  Tobacco Use  . Smoking status: Former Smoker    Packs/day: 0.50    Years: 0.50    Pack years: 0.25    Types: Cigarettes    Start date: 02/24/2018  . Smokeless tobacco: Never Used  . Tobacco comment: quit 03/25/2016  Substance and Sexual Activity  . Alcohol use: No    Alcohol/week: 0.0 oz  . Drug use: Yes    Types: Marijuana    Comment: 2/10 last marijuana use  .  Sexual activity: Not on file  Lifestyle  . Physical activity:    Days per week: Not on file    Minutes per session: Not on file  . Stress: Not on file  Relationships  . Social connections:    Talks on phone: Not on file    Gets together: Not on file    Attends religious service: Not on file    Active member of club or organization: Not on file    Attends meetings of clubs or organizations: Not on file    Relationship status: Not on file  . Intimate partner violence:    Fear of current or ex partner: Not on file    Emotionally abused: Not on file    Physically abused: Not on file    Forced sexual activity: Not on file  Other Topics Concern  . Not on file  Social History Narrative  . Not on file    Family History  Adopted: Yes  Problem Relation Age of Onset  . Heart disease Unknown        on both sides of family  . Diabetes Unknown        on both sides of family    BP 138/81   Pulse 72   Ht 5\' 2"  (1.575 m)   Wt 208 lb (94.3 kg)   BMI 38.04 kg/m   Review of Systems: See HPI above.     Objective:  Physical Exam:  Gen: NAD, comfortable in exam room  Neck: No gross deformity, swelling, bruising. TTP bilateral paraspinal cervical regions and medial to scapulae.  No midline/bony TTP. FROM with pain on flexion and extension. BUE strength 5/5.   Sensation intact to light touch.   2+ equal reflexes in triceps, biceps, brachioradialis tendons. Negative spurlings. NV intact distal BUEs.  Right hand/wrist: No deformity, swelling, bruising. FROM with 5/5 strength but pain on thumb extension, abduction. TTP 1st dorsal compartment.  No 1st CMC or carpal tunnel tenderness. Negative tinels, phalens. Positive finkelsteins. NVI distally.  Assessment & Plan:  1. Neck, bilateral shoulder pain - MRI was reassuring.  Secondary  to spasms/strain upper back, trapezius, scapular stabilizers.  NCV with moderate right carpal tunnel unrelated to her pain though.  Tizanidine and  gabapentin.  Continue home exercises and stretches.  F/u prn as she's waiting to get insurance straightened out.  When she does can send referral in again for PT.  2. Right wrist pain - 2/2 de quervain's tenosynovitis.  Continue thumb spica with icing as needed.  F/u prn.

## 2018-05-13 NOTE — Assessment & Plan Note (Signed)
MRI was reassuring.  Secondary to spasms/strain upper back, trapezius, scapular stabilizers.  NCV with moderate right carpal tunnel unrelated to her pain though.  Tizanidine and gabapentin.  Continue home exercises and stretches.  F/u prn as she's waiting to get insurance straightened out.  When she does can send referral in again for PT.

## 2018-05-16 ENCOUNTER — Ambulatory Visit (INDEPENDENT_AMBULATORY_CARE_PROVIDER_SITE_OTHER): Payer: 59 | Admitting: Psychology

## 2018-05-16 DIAGNOSIS — F4312 Post-traumatic stress disorder, chronic: Secondary | ICD-10-CM

## 2018-05-16 DIAGNOSIS — F4481 Dissociative identity disorder: Secondary | ICD-10-CM

## 2018-05-20 ENCOUNTER — Ambulatory Visit (INDEPENDENT_AMBULATORY_CARE_PROVIDER_SITE_OTHER): Payer: 59 | Admitting: Psychology

## 2018-05-20 DIAGNOSIS — F4312 Post-traumatic stress disorder, chronic: Secondary | ICD-10-CM

## 2018-05-20 DIAGNOSIS — F4481 Dissociative identity disorder: Secondary | ICD-10-CM | POA: Diagnosis not present

## 2018-05-21 ENCOUNTER — Encounter: Payer: Self-pay | Admitting: Family Medicine

## 2018-05-21 ENCOUNTER — Ambulatory Visit (INDEPENDENT_AMBULATORY_CARE_PROVIDER_SITE_OTHER): Payer: Managed Care, Other (non HMO) | Admitting: Family Medicine

## 2018-05-21 DIAGNOSIS — M25551 Pain in right hip: Secondary | ICD-10-CM | POA: Diagnosis not present

## 2018-05-21 MED ORDER — METHYLPREDNISOLONE ACETATE 40 MG/ML IJ SUSP
40.0000 mg | Freq: Once | INTRAMUSCULAR | Status: AC
  Administered 2018-05-21: 40 mg via INTRA_ARTICULAR

## 2018-05-21 NOTE — Patient Instructions (Signed)
You have several conditions that often occur together:  Snapping hip syndrome, IT band syndrome, trochanteric bursitis, weakness of gluteus medius. Avoid painful activities as much as possible. Ice over area of pain 3-4 times a day for 15 minutes at a time Standing hip rotations and Hip side raise exercises 3 sets of 10 once a day - add weights if this becomes too easy. Stretches - pick 2-3 and hold for 20-30 seconds x 3 - do once or twice a day. Continue medications as you have been. You were given a cortisone injection today. Physical therapy is an option. Follow up with me in 6 weeks for this.

## 2018-05-22 ENCOUNTER — Encounter: Payer: Self-pay | Admitting: Family Medicine

## 2018-05-22 DIAGNOSIS — M25551 Pain in right hip: Secondary | ICD-10-CM | POA: Insufficient documentation

## 2018-05-22 NOTE — Assessment & Plan Note (Signed)
consistent with combination of snapping hip, IT band syndrome, trochanteric bursitis, glut medius weakness.  Shown home exercises and stretches to do daily.  Icing.  COnsider physical therapy.  Continue her gabapentin, tizanidine.  Bursa injection given today.  After informed written consent timeout was performed, patient was lying on left side on exam table.  Area overlying right trochanteric bursa prepped with alcohol swab then injected with 6:2 bupivicaine: depomedrol.  Patient tolerated procedure well without immediate complications.

## 2018-05-22 NOTE — Progress Notes (Signed)
PCP: Laurey Morale, MD  Subjective:   HPI: Patient is a 42 y.o. female here for right hip pain.  Patient reports over past month she's had pain mainly lateral right hip. She's had a couple falls, dog dragged her down once. She had fallen onto right side but doesn't believe these caused her current pain. Pain is 6/10, sharp. Will catch and give her a severe pain. Worse with stairs also. No skin changes, numbness. No radiation of pain.  Past Medical History:  Diagnosis Date  . Anxiety   . Arthritis   . Asthma   . Bowel obstruction ([place])   . Cardiac arrhythmia   . Depression    sees Dr. Matilde Haymaker in [place]   . Diabetes mellitus    type 2, on insulin pump, sees Dr. Loanne Drilling   . GERD (gastroesophageal reflux disease)   . Hyperlipidemia   . Hypertension   . Hypothyroidism    sees Dr. Elyse Hsu  . Insomnia   . Migraine syndrome   . Morbid obesity ([place])   . 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 Neck pain   . Neuropathy associated with endocrine disorder ([place])   . Sleep apnea with use of continuous positive airway pressure (CPAP)    Sleep apnea is resolved due to weight loss. 08/2017  . 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 Stroke [facility]) 04/2016    Current Outpatient Medications on File Prior to Visit  Medication Sig Dispense Refill  . albuterol (PROAIR HFA) 108 (90 Base) MCG/ACT inhaler Inhale 2 puffs into the lungs every 4 (four) hours as needed for wheezing. 8.5 g 11  . ARIPiprazole (ABILIFY) 15 MG tablet Take 15 mg by mouth daily.    . budesonide-formoterol (SYMBICORT) 160-4.5 MCG/ACT inhaler Inhale 2 puffs into the lungs 2 (two) times daily. 1 Inhaler 11  . buPROPion (WELLBUTRIN XL) 300 MG 24 hr tablet Take 1 tablet (300 mg total) by mouth daily. 30 tablet 0  . clopidogrel (PLAVIX) 75 MG tablet Take 1 tablet (75 mg total) by mouth daily. 90 tablet 3  . cycloSPORINE (RESTASIS) 0.05 % ophthalmic emulsion Restasis 0.05 % eye drops in a dropperette    . desvenlafaxine  (PRISTIQ) 50 MG 24 hr tablet   0  . ferrous sulfate (FER-IN-SOL) 75 (15 Fe) MG/ML SOLN Take by mouth.    . gabapentin (NEURONTIN) 100 MG capsule Take 1 capsule (100 mg total) by mouth 3 (three) times daily. 90 capsule 1  . hydrOXYzine (ATARAX/VISTARIL) 50 MG tablet Take 50 mg by mouth. Take 1 tablet every night and may take 3 other times as needed    . insulin aspart (NOVOLOG) 100 UNIT/ML injection Inject into the skin.    Marland Kitchen levothyroxine (SYNTHROID, LEVOTHROID) 125 MCG tablet Take 1 tablet (125 mcg total) by mouth daily before breakfast. 90 tablet 3  . metoprolol succinate (TOPROL-XL) 25 MG 24 hr tablet Take 0.5 tablets (12.5 mg total) by mouth daily. 30 tablet 0  . Multiple Vitamin (MULTIVITAMIN) tablet Take 1 tablet by mouth daily.    . norethindrone (MICRONOR,CAMILA,ERRIN) 0.35 MG tablet     . oxyCODONE-acetaminophen (PERCOCET/ROXICET) 5-325 MG tablet Take 1 tablet by mouth every 6 (six) hours as needed for severe pain. 20 tablet 0  . pioglitazone (ACTOS) 45 MG tablet Take 1 tablet (45 mg total) by mouth daily. 30 tablet 0  . prazosin (MINIPRESS) 5 MG capsule Take 10 mg by mouth at bedtime.    . promethazine (PHENERGAN) 25 MG  tablet Take 1 tablet (25 mg total) every 6 (six) hours as needed by mouth for nausea or vomiting. 10 tablet 0  . rizatriptan (MAXALT) 10 MG tablet Take 1 tablet (10 mg total) by mouth as needed for migraine. May repeat in 2 hours if needed 30 tablet 5  . rosuvastatin (CRESTOR) 20 MG tablet TAKE 1 TABLET(20 MG) BY MOUTH DAILY 30 tablet 11  . sertraline (ZOLOFT) 100 MG tablet Take 200 mg by mouth daily.    Marland Kitchen topiramate (TOPAMAX) 100 MG tablet     . traZODone (DESYREL) 150 MG tablet Take by mouth at bedtime.    . valACYclovir (VALTREX) 1000 MG tablet Take 1 tablet (1,000 mg total) by mouth 3 (three) times daily. (Patient not taking: Reported on 04/19/2018) 21 tablet 5   No current facility-administered medications on file prior to visit.     Past Surgical History:   Procedure Laterality Date  . blocked itestinal repair  72   age 97 months  . BREAST BIOPSY Left 2017  . CHOLECYSTECTOMY  2011  . INDUCED ABORTION  1996   forced abortion  . INGUINAL HERNIA REPAIR Left 1980   age 31  . LAPAROSCOPIC ENDOMETRIOSIS FULGURATION  1998  . WISDOM TOOTH EXTRACTION  2007    Allergies  Allergen Reactions  . Macrobid [Nitrofurantoin] Hives  . Byetta 10 Mcg Pen [Exenatide] Hives  . Clindamycin/Lincomycin Nausea And Vomiting  . Geodon [Ziprasidone Hcl]   . Lipitor [Atorvastatin]     Per patient this causes cramps  . Nsaids     Upset stomach   . Penicillins Other (See Comments)    Has patient had a PCN reaction causing immediate rash, facial/tongue/throat swelling, SOB or lightheadedness with hypotension: No Has patient had a PCN reaction causing severe rash involving mucus membranes or skin necrosis: No Has patient had a PCN reaction that required hospitalization No Has patient had a PCN reaction occurring within the last 10 years: No If all of the above answers are "NO", then may proceed with Cephalosporin use.   Marland Kitchen Avocado Diarrhea and Other (See Comments)    Severe cramping, sweats, and diarrhea in upper GI/stomach  . Hydrocodone-Acetaminophen Nausea And Vomiting and Nausea Only    Lethargy Other reaction(s): Nausea and Vomiting, Sedation Lethargy  . Loracarbef Nausea Only  . Tramadol Hcl Itching and Rash    Social History   Socioeconomic History  . Marital status: Divorced    Spouse name: Not on file  . Number of children: 5  . Years of education: Not on file  . Highest education level: Not on file  Occupational History  . Occupation: Nurse, learning disability  Social Needs  . Financial resource strain: Not on file  . Food insecurity:    Worry: Not on file    Inability: Not on file  . Transportation needs:    Medical: Not on file    Non-medical: Not on file  Tobacco Use  . Smoking status: Former Smoker    Packs/day: 0.50     Years: 0.50    Pack years: 0.25    Types: Cigarettes    Start date: 02/24/2018  . Smokeless tobacco: Never Used  . Tobacco comment: quit 03/25/2016  Substance and Sexual Activity  . Alcohol use: No    Alcohol/week: 0.0 oz  . Drug use: Yes    Types: Marijuana    Comment: 2/10 last marijuana use  . Sexual activity: Not on file  Lifestyle  . Physical activity:  Days per week: Not on file    Minutes per session: Not on file  . Stress: Not on file  Relationships  . Social connections:    Talks on phone: Not on file    Gets together: Not on file    Attends religious service: Not on file    Active member of club or organization: Not on file    Attends meetings of clubs or organizations: Not on file    Relationship status: Not on file  . Intimate partner violence:    Fear of current or ex partner: Not on file    Emotionally abused: Not on file    Physically abused: Not on file    Forced sexual activity: Not on file  Other Topics Concern  . Not on file  Social History Narrative  . Not on file    Family History  Adopted: Yes  Problem Relation Age of Onset  . Heart disease Unknown        on both sides of family  . Diabetes Unknown        on both sides of family    BP (!) 89/64   Pulse 78   Ht 5\' 2"  (1.575 m)   Wt 208 lb (94.3 kg)   BMI 38.04 kg/m   Review of Systems: See HPI above.     Objective:  Physical Exam:  Gen: NAD, comfortable in exam room  Back: No gross deformity, scoliosis. No paraspinal tenderness.  No midline or bony TTP. FROM. Strength LEs 5/5 all muscle groups except hip noted below.   2+ MSRs in patellar and achilles tendons, equal bilaterally. Negative SLRs. Sensation intact to light touch bilaterally.  Right hip: No deformity, swelling, bruising. FROM with 5/5 strength except 4/5 with hip abduction, 5-/5 with flexion. Tenderness over proximal IT band, trochanteric bursa, posterior to trochanter. NVI distally. Negative logroll. Pain  with piriformis stretch.  Negative fabers, fadir.   Assessment & Plan:  1. Right hip pain - consistent with combination of snapping hip, IT band syndrome, trochanteric bursitis, glut medius weakness.  Shown home exercises and stretches to do daily.  Icing.  COnsider physical therapy.  Continue her gabapentin, tizanidine.  Bursa injection given today.  After informed written consent timeout was performed, patient was lying on left side on exam table.  Area overlying right trochanteric bursa prepped with alcohol swab then injected with 6:2 bupivicaine: depomedrol.  Patient tolerated procedure well without immediate complications.

## 2018-05-23 ENCOUNTER — Ambulatory Visit (INDEPENDENT_AMBULATORY_CARE_PROVIDER_SITE_OTHER): Payer: 59 | Admitting: Psychology

## 2018-05-23 DIAGNOSIS — F4312 Post-traumatic stress disorder, chronic: Secondary | ICD-10-CM

## 2018-05-23 DIAGNOSIS — F4481 Dissociative identity disorder: Secondary | ICD-10-CM | POA: Diagnosis not present

## 2018-05-27 ENCOUNTER — Ambulatory Visit (INDEPENDENT_AMBULATORY_CARE_PROVIDER_SITE_OTHER): Payer: 59 | Admitting: Psychology

## 2018-05-27 DIAGNOSIS — F4312 Post-traumatic stress disorder, chronic: Secondary | ICD-10-CM | POA: Diagnosis not present

## 2018-05-27 DIAGNOSIS — F4481 Dissociative identity disorder: Secondary | ICD-10-CM

## 2018-05-30 ENCOUNTER — Ambulatory Visit (INDEPENDENT_AMBULATORY_CARE_PROVIDER_SITE_OTHER): Payer: 59 | Admitting: Psychology

## 2018-05-30 DIAGNOSIS — F4481 Dissociative identity disorder: Secondary | ICD-10-CM

## 2018-05-30 DIAGNOSIS — F4312 Post-traumatic stress disorder, chronic: Secondary | ICD-10-CM

## 2018-06-03 ENCOUNTER — Ambulatory Visit (INDEPENDENT_AMBULATORY_CARE_PROVIDER_SITE_OTHER): Payer: 59 | Admitting: Psychology

## 2018-06-03 DIAGNOSIS — F4481 Dissociative identity disorder: Secondary | ICD-10-CM | POA: Diagnosis not present

## 2018-06-03 DIAGNOSIS — F4312 Post-traumatic stress disorder, chronic: Secondary | ICD-10-CM

## 2018-06-04 ENCOUNTER — Ambulatory Visit (INDEPENDENT_AMBULATORY_CARE_PROVIDER_SITE_OTHER): Payer: PRIVATE HEALTH INSURANCE | Admitting: Family Medicine

## 2018-06-04 ENCOUNTER — Encounter: Payer: Self-pay | Admitting: Family Medicine

## 2018-06-04 VITALS — BP 102/68 | HR 86 | Temp 98.3°F | Wt 213.5 lb

## 2018-06-04 DIAGNOSIS — H6982 Other specified disorders of Eustachian tube, left ear: Secondary | ICD-10-CM

## 2018-06-04 NOTE — Progress Notes (Signed)
Subjective:     Patient ID: Miranda [name], female   DOB: 03/19/1976, 42 y.o.   MRN: [id number]  HPI  Patient is seen with some mild left ear pain. She states that when she inhales through her nose she notices a popping-like sensation in her left ear. She states this "feels like water ". Very mild occasional vertigo. No acute hearing changes. Denies nasal congestion. Past history of eustachian tube dysfunction. Denies any fever. No sore throat symptoms.  No recent flying or scuba diving. She is getting ready to go to [place] for a backpacking trip soon  Past Medical History:  Diagnosis Date  . Anxiety   . Arthritis   . Asthma   . Bowel obstruction ([place])   . Cardiac arrhythmia   . Depression    sees Dr. Matilde Haymaker in [place]   . Diabetes mellitus    type 2, on insulin pump, sees Dr. Loanne Drilling   . GERD (gastroesophageal reflux disease)   . Hyperlipidemia   . Hypertension   . Hypothyroidism    sees Dr. Elyse Hsu  . Insomnia   . Migraine syndrome   . Morbid obesity ([place])   . 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 Neck pain   . Neuropathy associated with endocrine disorder ([place])   . Sleep apnea with use of continuous positive airway pressure (CPAP)    Sleep apnea is resolved due to weight loss. 08/2017  . 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 Stroke [facility]) 04/2016   Past Surgical History:  Procedure Laterality Date  . blocked itestinal repair  7   age 70 months  . BREAST BIOPSY Left 2017  . CHOLECYSTECTOMY  2011  . INDUCED ABORTION  1996   forced abortion  . INGUINAL HERNIA REPAIR Left 1980   age 20  . LAPAROSCOPIC ENDOMETRIOSIS FULGURATION  1998  . WISDOM TOOTH EXTRACTION  2007    reports that she has quit smoking. Her smoking use included cigarettes. She started smoking about 3 months ago. She has a 0.25 pack-year smoking history. She has never used smokeless tobacco. She reports that she has current or past drug history. Drug: Marijuana. She reports that she does not  drink alcohol. family history includes Diabetes in her unknown relative; Heart disease in her unknown relative. She was adopted. Allergies  Allergen Reactions  . Macrobid [Nitrofurantoin] Hives  . Byetta 10 Mcg Pen [Exenatide] Hives  . Clindamycin/Lincomycin Nausea And Vomiting  . Geodon [Ziprasidone Hcl]   . Lipitor [Atorvastatin]     Per patient this causes cramps  . Nsaids     Upset stomach   . Penicillins Other (See Comments)    Has patient had a PCN reaction causing immediate rash, facial/tongue/throat swelling, SOB or lightheadedness with hypotension: No Has patient had a PCN reaction causing severe rash involving mucus membranes or skin necrosis: No Has patient had a PCN reaction that required hospitalization No Has patient had a PCN reaction occurring within the last 10 years: No If all of the above answers are "NO", then may proceed with Cephalosporin use.   Marland Kitchen Avocado Diarrhea and Other (See Comments)    Severe cramping, sweats, and diarrhea in upper GI/stomach  . Loracarbef Nausea Only  . Tramadol Hcl Itching and Rash    Review of Systems  Constitutional: Negative for chills and fever.  HENT: Negative for congestion, ear discharge, facial swelling, hearing loss, sinus pain and sore throat.   Respiratory: Negative for cough and shortness of breath.  Neurological: Negative for headaches.       Objective:   Physical Exam  Constitutional: She appears well-developed and well-nourished.  HENT:  Right Ear: External ear normal.  Mouth/Throat: Oropharynx is clear and moist. No oropharyngeal exudate.  Only very minimal cerumen inferior aspect left canal  Cardiovascular: Normal rate and regular rhythm.  Pulmonary/Chest: Effort normal and breath sounds normal.       Assessment:     Probable acute left eustachian tube dysfunction     Plan:     -Consider getting back on Flonase -Consider Valsalva maneuver -Follow-up for any persistent or worsening symptoms  Eulas Post MD [facility]

## 2018-06-04 NOTE — Patient Instructions (Signed)
Eustachian Tube Dysfunction The eustachian tube connects the middle ear to the back of the nose. It regulates air pressure in the middle ear by allowing air to move between the ear and nose. It also helps to drain fluid from the middle ear space. When the eustachian tube does not function properly, air pressure, fluid, or both can build up in the middle ear. Eustachian tube dysfunction can affect one or both ears. What are the causes? This condition happens when the eustachian tube becomes blocked or cannot open normally. This may result from:  Ear infections.  Colds and other upper respiratory infections.  Allergies.  Irritation, such as from cigarette smoke or acid from the stomach coming up into the esophagus (gastroesophageal reflux).  Sudden changes in air pressure, such as from descending in an airplane.  Abnormal growths in the nose or throat, such as nasal polyps, tumors, or enlarged tissue at the back of the throat (adenoids).  What increases the risk? This condition may be more likely to develop in people who smoke and people who are overweight. Eustachian tube dysfunction may also be more likely to develop in children, especially children who have:  Certain birth defects of the mouth, such as cleft palate.  Large tonsils and adenoids.  What are the signs or symptoms? Symptoms of this condition may include:  A feeling of fullness in the ear.  Ear pain.  Clicking or popping noises in the ear.  Ringing in the ear.  Hearing loss.  Loss of balance.  Symptoms may get worse when the air pressure around you changes, such as when you travel to an area of high elevation or fly on an airplane. How is this diagnosed? This condition may be diagnosed based on:  Your symptoms.  A physical exam of your ear, nose, and throat.  Tests, such as those that measure: ? The movement of your eardrum (tympanogram). ? Your hearing (audiometry).  How is this treated? Treatment  depends on the cause and severity of your condition. If your symptoms are mild, you may be able to relieve your symptoms by moving air into ("popping") your ears. If you have symptoms of fluid in your ears, treatment may include:  Decongestants.  Antihistamines.  Nasal sprays or ear drops that contain medicines that reduce swelling (steroids).  In some cases, you may need to have a procedure to drain the fluid in your eardrum (myringotomy). In this procedure, a small tube is placed in the eardrum to:  Drain the fluid.  Restore the air in the middle ear space.  Follow these instructions at home:  Take over-the-counter and prescription medicines only as told by your health care provider.  Use techniques to help pop your ears as recommended by your health care provider. These may include: ? Chewing gum. ? Yawning. ? Frequent, forceful swallowing. ? Closing your mouth, holding your nose closed, and gently blowing as if you are trying to blow air out of your nose.  Do not do any of the following until your health care provider approves: ? Travel to high altitudes. ? Fly in airplanes. ? Work in a pressurized cabin or room. ? Scuba dive.  Keep your ears dry. Dry your ears completely after showering or bathing.  Do not smoke.  Keep all follow-up visits as told by your health care provider. This is important. Contact a health care provider if:  Your symptoms do not go away after treatment.  Your symptoms come back after treatment.  You are   unable to pop your ears.  You have: ? A fever. ? Pain in your ear. ? Pain in your head or neck. ? Fluid draining from your ear.  Your hearing suddenly changes.  You become very dizzy.  You lose your balance. This information is not intended to replace advice given to you by your health care provider. Make sure you discuss any questions you have with your health care provider. Document Released: 11/12/2015 Document Revised: 03/23/2016  Document Reviewed: 11/04/2014 Elsevier Interactive Patient Education  2018 [employer]  

## 2018-06-06 ENCOUNTER — Ambulatory Visit (INDEPENDENT_AMBULATORY_CARE_PROVIDER_SITE_OTHER): Payer: 59 | Admitting: Psychology

## 2018-06-06 DIAGNOSIS — F4481 Dissociative identity disorder: Secondary | ICD-10-CM | POA: Diagnosis not present

## 2018-06-06 DIAGNOSIS — F4312 Post-traumatic stress disorder, chronic: Secondary | ICD-10-CM | POA: Diagnosis not present

## 2018-06-10 ENCOUNTER — Ambulatory Visit: Payer: Self-pay | Admitting: Psychology

## 2018-06-11 ENCOUNTER — Encounter: Payer: Self-pay | Admitting: Family Medicine

## 2018-06-11 ENCOUNTER — Ambulatory Visit (INDEPENDENT_AMBULATORY_CARE_PROVIDER_SITE_OTHER): Payer: PRIVATE HEALTH INSURANCE | Admitting: Family Medicine

## 2018-06-11 VITALS — BP 100/68 | HR 95 | Temp 98.4°F | Ht 62.0 in | Wt 218.8 lb

## 2018-06-11 DIAGNOSIS — B3789 Other sites of candidiasis: Secondary | ICD-10-CM

## 2018-06-11 MED ORDER — NYSTATIN 100000 UNIT/GM EX POWD: Freq: Four times a day (QID) | CUTANEOUS | 5 refills | Status: DC

## 2018-06-11 NOTE — Progress Notes (Signed)
   Subjective:    Patient ID: Miranda [name], female    DOB: [birth year], 42 y.o.   MRN: [id number]  HPI Here for several weeks of an itchy rash under both breasts. She has had success with Nystatin powder in the past for this.    Review of Systems  Constitutional: Negative.   Respiratory: Negative.   Cardiovascular: Negative.   Skin: Positive for rash.       Objective:   Physical Exam  Constitutional: She appears well-developed and well-nourished.  Cardiovascular: Normal rate, regular rhythm, normal heart sounds and intact distal pulses.  Pulmonary/Chest: Effort normal and breath sounds normal.  Skin:  Areas of macular erythema under the breasts           Assessment & Plan:  Candidiasis, treat with Nystatin powder. Alysia Penna, MD

## 2018-06-12 ENCOUNTER — Ambulatory Visit
Admission: RE | Admit: 2018-06-12 | Discharge: 2018-06-12 | Disposition: A | Discharge status: Home / Self Care | Payer: Managed Care, Other (non HMO) | Source: Ambulatory Visit | Attending: Family Medicine | Admitting: Family Medicine

## 2018-06-12 ENCOUNTER — Ambulatory Visit (INDEPENDENT_AMBULATORY_CARE_PROVIDER_SITE_OTHER): Payer: PRIVATE HEALTH INSURANCE | Admitting: Family Medicine

## 2018-06-12 ENCOUNTER — Other Ambulatory Visit: Payer: Self-pay | Admitting: Family Medicine

## 2018-06-12 ENCOUNTER — Ambulatory Visit: Payer: PRIVATE HEALTH INSURANCE | Admitting: Family Medicine

## 2018-06-12 VITALS — BP 120/78 | Ht 64.0 in | Wt 218.0 lb

## 2018-06-12 DIAGNOSIS — S8991XA Unspecified injury of right lower leg, initial encounter: Secondary | ICD-10-CM | POA: Diagnosis not present

## 2018-06-12 DIAGNOSIS — M546 Pain in thoracic spine: Secondary | ICD-10-CM

## 2018-06-12 DIAGNOSIS — S99912A Unspecified injury of left ankle, initial encounter: Secondary | ICD-10-CM | POA: Diagnosis not present

## 2018-06-12 NOTE — Patient Instructions (Signed)
Get x-rays of your thoracic spine after you leave today - we will call you with the results.  You strained your patellar tendon, medial hamstring tendon, and at least bruised your medial meniscus. Icing 15 minutes at a time 3-4 times a day. Knee brace when up and walking around if tolerated. Biofreeze, aspercreme up to 4 times a day. Can use salon pas patches with this. Baclofen as needed. Can use your oxycodone as needed also. Follow up with me in 2 weeks for these issues.  You have a left ankle sprain. Ice the area for 15 minutes at a time, 3-4 times a day Elevate above the level of your heart when possible Bear weight when tolerated Consider ankle brace in the future. Twice a day do Up/down and alphabet exercises 2-3 sets of each. Consider physical therapy for strengthening and balance exercises. Follow up in 2 weeks.

## 2018-06-13 ENCOUNTER — Ambulatory Visit (INDEPENDENT_AMBULATORY_CARE_PROVIDER_SITE_OTHER): Payer: 59 | Admitting: Psychology

## 2018-06-13 ENCOUNTER — Ambulatory Visit: Payer: Managed Care, Other (non HMO) | Admitting: Family Medicine

## 2018-06-13 ENCOUNTER — Encounter: Payer: Self-pay | Admitting: Family Medicine

## 2018-06-13 DIAGNOSIS — F4312 Post-traumatic stress disorder, chronic: Secondary | ICD-10-CM

## 2018-06-13 DIAGNOSIS — F4481 Dissociative identity disorder: Secondary | ICD-10-CM | POA: Diagnosis not present

## 2018-06-13 NOTE — Progress Notes (Signed)
PCP: Miranda Morale, MD  Subjective:   HPI: Patient is a 42 y.o. female here for multiple injuries.  Patient reports she was hiking on Monday it was only about 1/10 of a mile away from the parking lot when she lifted her left foot and felt like her right knee gave out with a pop and severe pain causing her to fall straight downwards. She states since then she has had still fairly severe anterior medial right knee pain as well as lateral left ankle pain. She is also developed mid upper back pain that is sharp and severe. She has iced her knee and feels that baclofen and oxycodone have helped. Rest helps some as well. No skin changes, numbness.  Past Medical History:  Diagnosis Date  . Anxiety   . Arthritis   . Asthma   . Bowel obstruction ([place])   . Cardiac arrhythmia   . Depression    sees Dr. Matilde [name] in [place]   . Diabetes mellitus    type 2, on insulin pump, sees Dr. Loanne [name]   . GERD (gastroesophageal reflux disease)   . Hyperlipidemia   . Hypertension   . Hypothyroidism    sees Dr. Elyse [name]  . Insomnia   . Migraine syndrome   . Morbid obesity ([place])   . 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 Neck pain   . Neuropathy associated with endocrine disorder ([place])   . Sleep apnea with use of continuous positive airway pressure (CPAP)    Sleep apnea is resolved due to weight loss. 08/2017  . 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 Stroke [facility]) 04/2016    Current Outpatient Medications on File Prior to Visit  Medication Sig Dispense Refill  . albuterol (PROAIR HFA) 108 (90 Base) MCG/ACT inhaler Inhale 2 puffs into the lungs every 4 (four) hours as needed for wheezing. 8.5 g 11  . ARIPiprazole (ABILIFY) 30 MG tablet Take 30 mg by mouth daily.    . budesonide-formoterol (SYMBICORT) 160-4.5 MCG/ACT inhaler Inhale 2 puffs into the lungs 2 (two) times daily. 1 Inhaler 11  . buPROPion (WELLBUTRIN XL) 300 MG 24 hr tablet Take 1 tablet (300 mg total) by mouth daily. 30 tablet 0  .  clopidogrel (PLAVIX) 75 MG tablet Take 1 tablet (75 mg total) by mouth daily. 90 tablet 3  . cycloSPORINE (RESTASIS) 0.05 % ophthalmic emulsion Restasis 0.05 % eye drops in a dropperette    . desvenlafaxine (PRISTIQ) 50 MG 24 hr tablet 50 mg. 2 tablets daily  0  . ferrous sulfate (FER-IN-SOL) 75 (15 Fe) MG/ML SOLN Take by mouth.    . gabapentin (NEURONTIN) 100 MG capsule Take 1 capsule (100 mg total) by mouth 3 (three) times daily. 90 capsule 1  . hydrOXYzine (ATARAX/VISTARIL) 50 MG tablet Take 50 mg by mouth. Take 1 tablet every night and may take 3 other times as needed    . insulin aspart (NOVOLOG) 100 UNIT/ML injection Inject into the skin.    Marland Kitchen levothyroxine (SYNTHROID, LEVOTHROID) 125 MCG tablet Take 1 tablet (125 mcg total) by mouth daily before breakfast. 90 tablet 3  . metoprolol succinate (TOPROL-XL) 25 MG 24 hr tablet Take 0.5 tablets (12.5 mg total) by mouth daily. 30 tablet 0  . Multiple Vitamin (MULTIVITAMIN) tablet Take 1 tablet by mouth daily.    . norethindrone (MICRONOR,CAMILA,ERRIN) 0.35 MG tablet     . nystatin (NYSTATIN) powder Apply topically 4 (four) times daily. 60 g 5  . oxyCODONE-acetaminophen (PERCOCET/ROXICET) 5-325  MG tablet Take 1 tablet by mouth every 6 (six) hours as needed for severe pain. 20 tablet 0  . pioglitazone (ACTOS) 45 MG tablet Take 1 tablet (45 mg total) by mouth daily. 30 tablet 0  . prazosin (MINIPRESS) 5 MG capsule Take 10 mg by mouth at bedtime.    . promethazine (PHENERGAN) 25 MG tablet Take 1 tablet (25 mg total) every 6 (six) hours as needed by mouth for nausea or vomiting. 10 tablet 0  . rizatriptan (MAXALT) 10 MG tablet Take 1 tablet (10 mg total) by mouth as needed for migraine. May repeat in 2 hours if needed 30 tablet 5  . rosuvastatin (CRESTOR) 20 MG tablet TAKE 1 TABLET(20 MG) BY MOUTH DAILY 30 tablet 11  . topiramate (TOPAMAX) 100 MG tablet Take 100 mg by mouth 2 (two) times daily.     . traZODone (DESYREL) 150 MG tablet Take by mouth at  bedtime.    . valACYclovir (VALTREX) 1000 MG tablet Take 1 tablet (1,000 mg total) by mouth 3 (three) times daily. (Patient taking differently: Take 1,000 mg by mouth 3 (three) times daily as needed. ) 21 tablet 5   No current facility-administered medications on file prior to visit.     Past Surgical History:  Procedure Laterality Date  . blocked itestinal repair  25   age 49 months  . BREAST BIOPSY Left 2017  . CHOLECYSTECTOMY  2011  . INDUCED ABORTION  1996   forced abortion  . INGUINAL HERNIA REPAIR Left 1980   age 62  . LAPAROSCOPIC ENDOMETRIOSIS FULGURATION  1998  . WISDOM TOOTH EXTRACTION  2007    Allergies  Allergen Reactions  . Macrobid [Nitrofurantoin] Hives  . Byetta 10 Mcg Pen [Exenatide] Hives  . Clindamycin/Lincomycin Nausea And Vomiting  . Geodon [Ziprasidone Hcl]   . Lipitor [Atorvastatin]     Per patient this causes cramps  . Nsaids     Upset stomach   . Penicillins Other (See Comments)    Has patient had a PCN reaction causing immediate rash, facial/tongue/throat swelling, SOB or lightheadedness with hypotension: No Has patient had a PCN reaction causing severe rash involving mucus membranes or skin necrosis: No Has patient had a PCN reaction that required hospitalization No Has patient had a PCN reaction occurring within the last 10 years: No If all of the above answers are "NO", then may proceed with Cephalosporin use.   Marland Kitchen Avocado Diarrhea and Other (See Comments)    Severe cramping, sweats, and diarrhea in upper GI/stomach  . Tramadol Hcl Itching and Rash    Social History   Socioeconomic History  . Marital status: Divorced    Spouse name: Not on file  . Number of children: 5  . Years of education: Not on file  . Highest education level: Not on file  Occupational History  . Occupation: Nurse, learning disability  Social Needs  . Financial resource strain: Not on file  . Food insecurity:    Worry: Not on file    Inability: Not on file   . Transportation needs:    Medical: Not on file    Non-medical: Not on file  Tobacco Use  . Smoking status: Former Smoker    Packs/day: 0.50    Years: 0.50    Pack years: 0.25    Types: Cigarettes    Start date: 02/24/2018  . Smokeless tobacco: Never Used  . Tobacco comment: quit 03/25/2016  Substance and Sexual Activity  . Alcohol use: No  Alcohol/week: 0.0 standard drinks  . Drug use: Yes    Types: Marijuana    Comment: 2/10 last marijuana use  . Sexual activity: Not on file  Lifestyle  . Physical activity:    Days per week: Not on file    Minutes per session: Not on file  . Stress: Not on file  Relationships  . Social connections:    Talks on phone: Not on file    Gets together: Not on file    Attends religious service: Not on file    Active member of club or organization: Not on file    Attends meetings of clubs or organizations: Not on file    Relationship status: Not on file  . Intimate partner violence:    Fear of current or ex partner: Not on file    Emotionally abused: Not on file    Physically abused: Not on file    Forced sexual activity: Not on file  Other Topics Concern  . Not on file  Social History Narrative  . Not on file    Family History  Adopted: Yes  Problem Relation Age of Onset  . Heart disease Unknown        on both sides of family  . Diabetes Unknown        on both sides of family    BP 120/78   Ht 5\' 4"  (1.626 m)   Wt 218 lb (98.9 kg)   LMP 05/25/2018   BMI 37.42 kg/m   Review of Systems: See HPI above.     Objective:  Physical Exam:  Gen: NAD, comfortable in exam room  Right knee: No gross deformity, ecchymoses, swelling. TTP patellar tendon, medial hamstring tendon posteriorly into pes area.  Tenderness medial joint line.  No other tenderness. FROM with 5/5 strength flexion and extension. Negative ant/post drawers. Negative valgus/varus testing. Negative lachmanns. Negative mcmurrays, apleys, positive patellar  apprehension. NV intact distally.  Left knee: No deformity. FROM with 5/5 strength. No tenderness to palpation. NVI distally.  Left ankle: Mild lateral swelling.  No other gross deformity, ecchymoses FROM with 5/5 strength. TTP mildly over ATFL.  No other tenderness. Trace ant drawer and talar tilt.   Negative syndesmotic compression. Thompsons test negative. NV intact distally.  Upper back: No deformity, palpable stepoffs. FROM with 5/5 strength upper extremities. TTP midline and paraspinal thoracic regions bilaterally. NVI distally.  MSK u/s right knee:  Small effusion.  No patellar tendon tears, medial hamstring tears.  Visualized portions of medial meniscus also intact.  Assessment & Plan:  1. Right knee injury - exam overall reassuring as well as ultrasound.  Consistent with patellar tendon and medial hamstring tendon strains, at least contusion of medial meniscus.  Icing, knee brace.  Biofreeze, aspercreme, salon pas.  Baclofen as needed.  Oxycodone as needed.  F/u in 2 weeks.  2. Left ankle sprain - mild.  Icing, shown exercises to do daily.  Topical medications if needed.  F/u in 2 weeks.  3. Thoracic strain - independently reviewed radiographs and no evidence fracture.  Reassured patient.  Icing, biofreeze, aspercreme, salon pas.  Baclofen, oxycodone as needed.  Consider therapy.

## 2018-06-17 ENCOUNTER — Ambulatory Visit (INDEPENDENT_AMBULATORY_CARE_PROVIDER_SITE_OTHER): Payer: 59 | Admitting: Psychology

## 2018-06-17 DIAGNOSIS — F4312 Post-traumatic stress disorder, chronic: Secondary | ICD-10-CM

## 2018-06-17 DIAGNOSIS — F4481 Dissociative identity disorder: Secondary | ICD-10-CM | POA: Diagnosis not present

## 2018-06-24 ENCOUNTER — Ambulatory Visit (INDEPENDENT_AMBULATORY_CARE_PROVIDER_SITE_OTHER): Payer: 59 | Admitting: Psychology

## 2018-06-24 DIAGNOSIS — F4481 Dissociative identity disorder: Secondary | ICD-10-CM | POA: Diagnosis not present

## 2018-06-24 DIAGNOSIS — F4312 Post-traumatic stress disorder, chronic: Secondary | ICD-10-CM

## 2018-06-27 ENCOUNTER — Ambulatory Visit (INDEPENDENT_AMBULATORY_CARE_PROVIDER_SITE_OTHER): Payer: 59 | Admitting: Psychology

## 2018-06-27 ENCOUNTER — Ambulatory Visit: Payer: Self-pay | Admitting: Family Medicine

## 2018-06-27 DIAGNOSIS — F4312 Post-traumatic stress disorder, chronic: Secondary | ICD-10-CM

## 2018-06-27 DIAGNOSIS — F4481 Dissociative identity disorder: Secondary | ICD-10-CM | POA: Diagnosis not present

## 2018-06-28 ENCOUNTER — Other Ambulatory Visit: Payer: Self-pay | Admitting: Family Medicine

## 2018-07-04 ENCOUNTER — Ambulatory Visit: Payer: Self-pay | Admitting: Family Medicine

## 2018-07-04 ENCOUNTER — Ambulatory Visit (INDEPENDENT_AMBULATORY_CARE_PROVIDER_SITE_OTHER): Payer: 59 | Admitting: Psychology

## 2018-07-04 DIAGNOSIS — F4481 Dissociative identity disorder: Secondary | ICD-10-CM | POA: Diagnosis not present

## 2018-07-04 DIAGNOSIS — F4312 Post-traumatic stress disorder, chronic: Secondary | ICD-10-CM

## 2018-07-08 ENCOUNTER — Ambulatory Visit (INDEPENDENT_AMBULATORY_CARE_PROVIDER_SITE_OTHER): Payer: PRIVATE HEALTH INSURANCE | Admitting: Family Medicine

## 2018-07-08 ENCOUNTER — Encounter: Payer: Self-pay | Admitting: Family Medicine

## 2018-07-08 VITALS — BP 116/84 | HR 85 | Ht 62.0 in | Wt 218.0 lb

## 2018-07-08 DIAGNOSIS — M1712 Unilateral primary osteoarthritis, left knee: Secondary | ICD-10-CM | POA: Diagnosis not present

## 2018-07-08 DIAGNOSIS — M6798 Unspecified disorder of synovium and tendon, other site: Secondary | ICD-10-CM

## 2018-07-08 DIAGNOSIS — M654 Radial styloid tenosynovitis [de Quervain]: Secondary | ICD-10-CM

## 2018-07-08 DIAGNOSIS — M25562 Pain in left knee: Secondary | ICD-10-CM | POA: Diagnosis not present

## 2018-07-08 DIAGNOSIS — G8929 Other chronic pain: Secondary | ICD-10-CM | POA: Diagnosis not present

## 2018-07-08 DIAGNOSIS — M1711 Unilateral primary osteoarthritis, right knee: Secondary | ICD-10-CM | POA: Diagnosis not present

## 2018-07-08 DIAGNOSIS — M1811 Unilateral primary osteoarthritis of first carpometacarpal joint, right hand: Secondary | ICD-10-CM

## 2018-07-08 DIAGNOSIS — M25561 Pain in right knee: Secondary | ICD-10-CM | POA: Diagnosis not present

## 2018-07-08 DIAGNOSIS — M67951 Unspecified disorder of synovium and tendon, right thigh: Secondary | ICD-10-CM

## 2018-07-08 MED ORDER — METHYLPREDNISOLONE ACETATE 40 MG/ML IJ SUSP
40.0000 mg | Freq: Once | INTRAMUSCULAR | Status: AC
  Administered 2018-07-08: 40 mg via INTRA_ARTICULAR

## 2018-07-08 NOTE — Patient Instructions (Signed)
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 Avoid painful activities as much as possible. Wear the thumb spica brace as often as possible to rest this however if this bothers you more, don't force yourself to wear it. Ice 15 minutes at a time 3-4 times a day. A cortisone injection typically helps a great deal with this and is an option - consider after another 1-2 weeks since you're getting cortisone shots today. Physical therapy for iontophoresis is another consideration.  Your pain is due to arthritis. These are the different medications you can take for this: Tylenol 500mg  1-2 tabs three times a day for pain. Capsaicin, aspercreme, or biofreeze topically up to four times a day may also help with pain. Some supplements that may help for arthritis: Boswellia extract, curcumin, pycnogenol Cortisone injections are an option - you were given these today. If cortisone injections do not help, there are different types of shots that may help but they take longer to take effect. It's important that you continue to stay active. Straight leg raises, knee extensions 3 sets of 10 once a day (add ankle weight if these become too easy). Consider physical therapy to strengthen muscles around the joint that hurts to take pressure off of the joint itself. Shoe inserts with good arch support may be helpful. Heat or ice 15 minutes at a time 3-4 times a day as needed to help with pain. Water aerobics and cycling with low resistance are the best two types of exercise for arthritis though any exercise is ok as long as it doesn't worsen the pain. Follow up with me in 6 weeks.

## 2018-07-08 NOTE — Progress Notes (Signed)
PCP: Laurey Morale, MD  Subjective:   HPI: Patient is a 42 y.o. female here for follow-up for right hip and right knee pain.  Overall her right lateral hip she feels is doing fairly well.  She reports occasional 3/10 pain mostly when she crosses her legs at the ankles.  She denies any radiation of the pain  She denies any new injuries.  She has been doing home exercise strengthening which she feels has been going well.  With regards to her right knee pain, she feels like she is doing slightly worse.  She also has similar pain in the left knee.  She reports 6/10 medial pain bilaterally .  Initially she was having some improvement but has now worsened with activity.  Her pain is worse with stairs and any prolonged activity while she is standing.  She reports feeling occasional stiffness and swelling.  She denies any erythema or bruising.  She denies any locking or feeling of instability.  She has been wearing a hinged knee brace which has not been very helpful but she feels it is too large.  She denies any numbness or tingling distally.  Patient also notes concerns of pain in the wrist.  Previous diagnosis of de Quervain's tenosynovitis.  She is thumb spica which is somewhat uncomfortable for her.  She localizes pain at the base of the right thumb.  Pain with extension.  She denies any erythema, bruising with associated skin changes.  No numbness or tingling in the hand.     Past Medical History:  Diagnosis Date  . Anxiety   . Arthritis   . Asthma   . Bowel obstruction ([place])   . Cardiac arrhythmia   . Depression    sees Dr. Matilde Haymaker in [place]   . Diabetes mellitus    type 2, on insulin pump, sees Dr. Loanne Drilling   . GERD (gastroesophageal reflux disease)   . Hyperlipidemia   . Hypertension   . Hypothyroidism    sees Dr. Elyse Hsu  . Insomnia   . Migraine syndrome   . Morbid obesity ([place])   . 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 Neck pain   . Neuropathy associated with endocrine  disorder ([place])   . Sleep apnea with use of continuous positive airway pressure (CPAP)    Sleep apnea is resolved due to weight loss. 08/2017  . 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 Stroke [facility]) 04/2016    Current Outpatient Medications on File Prior to Visit  Medication Sig Dispense Refill  . albuterol (PROAIR HFA) 108 (90 Base) MCG/ACT inhaler Inhale 2 puffs into the lungs every 4 (four) hours as needed for wheezing. 8.5 g 11  . ARIPiprazole (ABILIFY) 30 MG tablet Take 30 mg by mouth daily.    . budesonide-formoterol (SYMBICORT) 160-4.5 MCG/ACT inhaler Inhale 2 puffs into the lungs 2 (two) times daily. 1 Inhaler 11  . buPROPion (WELLBUTRIN XL) 300 MG 24 hr tablet Take 1 tablet (300 mg total) by mouth daily. 30 tablet 0  . clopidogrel (PLAVIX) 75 MG tablet Take 1 tablet (75 mg total) by mouth daily. 90 tablet 3  . cycloSPORINE (RESTASIS) 0.05 % ophthalmic emulsion Restasis 0.05 % eye drops in a dropperette    . desvenlafaxine (PRISTIQ) 50 MG 24 hr tablet 50 mg. 2 tablets daily  0  . ferrous sulfate (FER-IN-SOL) 75 (15 Fe) MG/ML SOLN Take by mouth.    . gabapentin (NEURONTIN) 100 MG capsule TAKE 1 CAPSULE(100 MG) BY  MOUTH THREE TIMES DAILY 90 capsule 2  . hydrOXYzine (ATARAX/VISTARIL) 50 MG tablet Take 50 mg by mouth. Take 1 tablet every night and may take 3 other times as needed    . insulin aspart (NOVOLOG) 100 UNIT/ML injection Inject into the skin.    Marland Kitchen levothyroxine (SYNTHROID, LEVOTHROID) 125 MCG tablet Take 1 tablet (125 mcg total) by mouth daily before breakfast. 90 tablet 3  . metoprolol succinate (TOPROL-XL) 25 MG 24 hr tablet Take 0.5 tablets (12.5 mg total) by mouth daily. 30 tablet 0  . Multiple Vitamin (MULTIVITAMIN) tablet Take 1 tablet by mouth daily.    . norethindrone (MICRONOR,CAMILA,ERRIN) 0.35 MG tablet     . nystatin (NYSTATIN) powder Apply topically 4 (four) times daily. 60 g 5  . oxyCODONE-acetaminophen (PERCOCET/ROXICET) 5-325 MG tablet Take 1 tablet by mouth every 6  (six) hours as needed for severe pain. 20 tablet 0  . pioglitazone (ACTOS) 45 MG tablet Take 1 tablet (45 mg total) by mouth daily. 30 tablet 0  . prazosin (MINIPRESS) 5 MG capsule Take 10 mg by mouth at bedtime.    . promethazine (PHENERGAN) 25 MG tablet Take 1 tablet (25 mg total) every 6 (six) hours as needed by mouth for nausea or vomiting. 10 tablet 0  . rizatriptan (MAXALT) 10 MG tablet Take 1 tablet (10 mg total) by mouth as needed for migraine. May repeat in 2 hours if needed 30 tablet 5  . rosuvastatin (CRESTOR) 20 MG tablet TAKE 1 TABLET(20 MG) BY MOUTH DAILY 30 tablet 11  . topiramate (TOPAMAX) 100 MG tablet Take 100 mg by mouth 2 (two) times daily.     . traZODone (DESYREL) 150 MG tablet Take by mouth at bedtime.    . valACYclovir (VALTREX) 1000 MG tablet Take 1 tablet (1,000 mg total) by mouth 3 (three) times daily. (Patient taking differently: Take 1,000 mg by mouth 3 (three) times daily as needed. ) 21 tablet 5   No current facility-administered medications on file prior to visit.     Past Surgical History:  Procedure Laterality Date  . blocked itestinal repair  65   age 17 months  . BREAST BIOPSY Left 2017  . CHOLECYSTECTOMY  2011  . INDUCED ABORTION  1996   forced abortion  . INGUINAL HERNIA REPAIR Left 1980   age 59  . LAPAROSCOPIC ENDOMETRIOSIS FULGURATION  1998  . WISDOM TOOTH EXTRACTION  2007    Allergies  Allergen Reactions  . Macrobid [Nitrofurantoin] Hives  . Byetta 10 Mcg Pen [Exenatide] Hives  . Clindamycin/Lincomycin Nausea And Vomiting  . Geodon [Ziprasidone Hcl]   . Lipitor [Atorvastatin]     Per patient this causes cramps  . Nsaids     Upset stomach   . Penicillins Other (See Comments)    Has patient had a PCN reaction causing immediate rash, facial/tongue/throat swelling, SOB or lightheadedness with hypotension: No Has patient had a PCN reaction causing severe rash involving mucus membranes or skin necrosis: No Has patient had a PCN reaction that  required hospitalization No Has patient had a PCN reaction occurring within the last 10 years: No If all of the above answers are "NO", then may proceed with Cephalosporin use.   Marland Kitchen Avocado Diarrhea and Other (See Comments)    Severe cramping, sweats, and diarrhea in upper GI/stomach  . Tramadol Hcl Itching and Rash    Social History   Socioeconomic History  . Marital status: Divorced    Spouse name: Not on file  .  Number of children: 5  . Years of education: Not on file  . Highest education level: Not on file  Occupational History  . Occupation: Nurse, learning disability  Social Needs  . Financial resource strain: Not on file  . Food insecurity:    Worry: Not on file    Inability: Not on file  . Transportation needs:    Medical: Not on file    Non-medical: Not on file  Tobacco Use  . Smoking status: Former Smoker    Packs/day: 0.50    Years: 0.50    Pack years: 0.25    Types: Cigarettes    Start date: 02/24/2018  . Smokeless tobacco: Never Used  . Tobacco comment: quit 03/25/2016  Substance and Sexual Activity  . Alcohol use: No    Alcohol/week: 0.0 standard drinks  . Drug use: Yes    Types: Marijuana    Comment: 2/10 last marijuana use  . Sexual activity: Not on file  Lifestyle  . Physical activity:    Days per week: Not on file    Minutes per session: Not on file  . Stress: Not on file  Relationships  . Social connections:    Talks on phone: Not on file    Gets together: Not on file    Attends religious service: Not on file    Active member of club or organization: Not on file    Attends meetings of clubs or organizations: Not on file    Relationship status: Not on file  . Intimate partner violence:    Fear of current or ex partner: Not on file    Emotionally abused: Not on file    Physically abused: Not on file    Forced sexual activity: Not on file  Other Topics Concern  . Not on file  Social History Narrative  . Not on file    Family History   Adopted: Yes  Problem Relation Age of Onset  . Heart disease Unknown        on both sides of family  . Diabetes Unknown        on both sides of family    BP 116/84   Pulse 85   Ht 5\' 2"  (1.575 m)   Wt 218 lb (98.9 kg)   BMI 39.87 kg/m   Review of Systems: See HPI above.     Objective:  Physical Exam:  Gen: awake, alert, NAD, comfortable in exam room Pulm: breathing unlabored  Right hip:  - Inspection: No gross deformity - Palpation: TTP over GT and distal glute med insertion - ROM: Full ROM  - Strength: Normal strength. - Special Tests: positive trendelenberg. Neg logroll  Right Knee: - Inspection: no gross deformity. No swelling/effusion, erythema or bruising. Skin intact  - Palpation: medial joint line TTP, bakers cyst palpated posteriorly - ROM: full ROM - Strength: full strength on resisted flexion and extension - Neuro/vasc: Normal sensation - Special Tests: - LIGAMENTS: negative Lachman's, no MCL or LCL laxity  -- MENISCUS: negative McMurray's -- PF JOINT: nml patellar mobility bilaterally.  negative patellar grind, negative patellar apprehension   Left Knee: - Inspection: no gross deformity. No swelling/effusion, erythema or bruising. Skin intact - Palpation: medial joint line tenderness - ROM: full ROM - Strength: full strength on resisted flexion and extension - Neuro/vasc: Normal sensation - Special Tests: - LIGAMENTS: negative Lachman's, no MCL or LCL laxity  -- MENISCUS: negative McMurray's   Right Hand: Inspection: No obvious deformity. No swelling, erythema  or bruising Palpation: TTP over the 1st dorsal compartment and 1st CMC             ROM: Full ROM of the digits and wrist Strength: 5/5 hand and wrist strength. Pain with resisted thumb extension Neurovascular: NV intact Special tests: Positive finkelstein's, negative tinel's at the carpal tunnel, pain with CMC grind MSK [place]: limited US of the right wrist reveals evidence of tenosynovitis  and first dorsal compartment.  There is also mild osteophyte formation at the first [facility]   Assessment & Plan:  1.  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  Overall this is improving with home exercises.  Patient will continue these.  Expect continued resolution especially as we address her knee pain and physical therapy works to correct mechanical imbalances  2.  Bilateral knee pain-secondary to osteoarthritis.  Patient has x-rays from December 2018.  These were reviewed today and show severe medial compartment arthritis.  This is consistent with the patient's reported pain. There is no ligamentous instability on exam. -Bilateral corticosteroid injection performed today as described below - Refer to physical therapy -Follow-up in 6 weeks  3.  Right thumb pain- I believe this is a combination of de Quervain's tenosynovitis as well as mild arthritis at the first [facility].  At this time she will continue to wear a thumb spica splint.  Tylenol or NSAIDs for pain.  Ice.  Consider steroid injection in a few weeks for this.  Procedure performed: Bilateral knee intraarticular corticosteroid injection; [place] assited  Consent obtained and verified. Time-out conducted. Noted no overlying erythema, induration, or other signs of local infection. The medial joint space was identified bilaterally under ultrasound and marked. The overlying skin was prepped in a sterile fashion. Topical analgesic spray: Ethyl chloride. Joint: Bilateral knee Needle: 25-gauge, 1.5 inch Completed without difficulty. Meds: Depo-Medrol 40 mg, bupivacaine 3 cc  Advised to call if fevers/chills, erythema, induration, drainage, or persistent bleeding.

## 2018-07-22 ENCOUNTER — Ambulatory Visit: Payer: 59 | Admitting: Psychology

## 2018-07-25 ENCOUNTER — Ambulatory Visit (INDEPENDENT_AMBULATORY_CARE_PROVIDER_SITE_OTHER): Payer: PRIVATE HEALTH INSURANCE | Admitting: Family Medicine

## 2018-07-25 ENCOUNTER — Ambulatory Visit (INDEPENDENT_AMBULATORY_CARE_PROVIDER_SITE_OTHER): Payer: 59 | Admitting: Psychology

## 2018-07-25 ENCOUNTER — Encounter: Payer: Self-pay | Admitting: Family Medicine

## 2018-07-25 DIAGNOSIS — F4481 Dissociative identity disorder: Secondary | ICD-10-CM

## 2018-07-25 DIAGNOSIS — F4312 Post-traumatic stress disorder, chronic: Secondary | ICD-10-CM

## 2018-07-25 DIAGNOSIS — M654 Radial styloid tenosynovitis [de Quervain]: Secondary | ICD-10-CM

## 2018-07-25 MED ORDER — METHYLPREDNISOLONE ACETATE 40 MG/ML IJ SUSP
20.0000 mg | Freq: Once | INTRAMUSCULAR | Status: AC
  Administered 2018-07-25: 20 mg via INTRA_ARTICULAR

## 2018-07-26 ENCOUNTER — Telehealth: Payer: Self-pay | Admitting: Family Medicine

## 2018-07-26 ENCOUNTER — Encounter: Payer: Self-pay | Admitting: Family Medicine

## 2018-07-26 NOTE — Telephone Encounter (Signed)
Patient calling with some concerns regarding the injection she received yesterday. She is experiencing bruising and pain in her wrist and into her thumb and pain with ROM. Patient states she hasn't felt this pain with previous injections

## 2018-07-26 NOTE — Telephone Encounter (Signed)
Patient was informed and had no further questions or concerns at this time

## 2018-07-26 NOTE — Progress Notes (Signed)
PCP: Laurey Morale, MD  Subjective:   HPI:  9/9: Patient is a 42 y.o. female here for follow-up for right hip and right knee pain.  Overall her right lateral hip she feels is doing fairly well.  She reports occasional 3/10 pain mostly when she crosses her legs at the ankles.  She denies any radiation of the pain  She denies any new injuries.  She has been doing home exercise strengthening which she feels has been going well.  With regards to her right knee pain, she feels like she is doing slightly worse.  She also has similar pain in the left knee.  She reports 6/10 medial pain bilaterally .  Initially she was having some improvement but has now worsened with activity.  Her pain is worse with stairs and any prolonged activity while she is standing.  She reports feeling occasional stiffness and swelling.  She denies any erythema or bruising.  She denies any locking or feeling of instability.  She has been wearing a hinged knee brace which has not been very helpful but she feels it is too large.  She denies any numbness or tingling distally.  Patient also notes concerns of pain in the wrist.  Previous diagnosis of de Quervain's tenosynovitis.  She is thumb spica which is somewhat uncomfortable for her.  She localizes pain at the base of the right thumb.  Pain with extension.  She denies any erythema, bruising with associated skin changes.  No numbness or tingling in the hand.  9/26: Patient returns today for injection to right wrist. Pain is 5/10 and has continued despite bracing. No skin changes, numbness.  Past Medical History:  Diagnosis Date  . Anxiety   . Arthritis   . Asthma   . Bowel obstruction ([place])   . Cardiac arrhythmia   . Depression    sees Dr. Matilde Haymaker in [place]   . Diabetes mellitus    type 2, on insulin pump, sees Dr. Loanne Drilling   . GERD (gastroesophageal reflux disease)   . Hyperlipidemia   . Hypertension   . Hypothyroidism    sees Dr. Elyse Hsu  . Insomnia    . Migraine syndrome   . Morbid obesity ([place])   . 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 Neck pain   . Neuropathy associated with endocrine disorder ([place])   . Sleep apnea with use of continuous positive airway pressure (CPAP)    Sleep apnea is resolved due to weight loss. 08/2017  . 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 Stroke [facility]) 04/2016    Current Outpatient Medications on File Prior to Visit  Medication Sig Dispense Refill  . albuterol (PROAIR HFA) 108 (90 Base) MCG/ACT inhaler Inhale 2 puffs into the lungs every 4 (four) hours as needed for wheezing. 8.5 g 11  . ARIPiprazole (ABILIFY) 30 MG tablet Take 30 mg by mouth daily.    . budesonide-formoterol (SYMBICORT) 160-4.5 MCG/ACT inhaler Inhale 2 puffs into the lungs 2 (two) times daily. 1 Inhaler 11  . buPROPion (WELLBUTRIN XL) 300 MG 24 hr tablet Take 1 tablet (300 mg total) by mouth daily. 30 tablet 0  . clopidogrel (PLAVIX) 75 MG tablet Take 1 tablet (75 mg total) by mouth daily. 90 tablet 3  . cycloSPORINE (RESTASIS) 0.05 % ophthalmic emulsion Restasis 0.05 % eye drops in a dropperette    . desvenlafaxine (PRISTIQ) 50 MG 24 hr tablet 50 mg. 2 tablets daily  0  . ferrous sulfate (FER-IN-SOL) 75 (  15 Fe) MG/ML SOLN Take by mouth.    . gabapentin (NEURONTIN) 100 MG capsule TAKE 1 CAPSULE(100 MG) BY MOUTH THREE TIMES DAILY 90 capsule 2  . hydrOXYzine (ATARAX/VISTARIL) 50 MG tablet Take 50 mg by mouth. Take 1 tablet every night and may take 3 other times as needed    . hydrOXYzine (VISTARIL) 50 MG capsule TK UP TO 3 CS PO HS PRF SLP  0  . insulin aspart (NOVOLOG) 100 UNIT/ML injection Inject into the skin.    Marland Kitchen levothyroxine (SYNTHROID, LEVOTHROID) 125 MCG tablet Take 1 tablet (125 mcg total) by mouth daily before breakfast. 90 tablet 3  . metoprolol succinate (TOPROL-XL) 25 MG 24 hr tablet Take 0.5 tablets (12.5 mg total) by mouth daily. 30 tablet 0  . Multiple Vitamin (MULTIVITAMIN) tablet Take 1 tablet by mouth daily.    . norethindrone  (MICRONOR,CAMILA,ERRIN) 0.35 MG tablet     . nystatin (NYSTATIN) powder Apply topically 4 (four) times daily. 60 g 5  . oxyCODONE-acetaminophen (PERCOCET/ROXICET) 5-325 MG tablet Take 1 tablet by mouth every 6 (six) hours as needed for severe pain. 20 tablet 0  . pioglitazone (ACTOS) 45 MG tablet Take 1 tablet (45 mg total) by mouth daily. 30 tablet 0  . prazosin (MINIPRESS) 5 MG capsule Take 10 mg by mouth at bedtime.    . promethazine (PHENERGAN) 25 MG tablet Take 1 tablet (25 mg total) every 6 (six) hours as needed by mouth for nausea or vomiting. 10 tablet 0  . rizatriptan (MAXALT) 10 MG tablet Take 1 tablet (10 mg total) by mouth as needed for migraine. May repeat in 2 hours if needed 30 tablet 5  . rosuvastatin (CRESTOR) 20 MG tablet TAKE 1 TABLET(20 MG) BY MOUTH DAILY 30 tablet 11  . topiramate (TOPAMAX) 100 MG tablet Take 100 mg by mouth 2 (two) times daily.     . traZODone (DESYREL) 150 MG tablet Take by mouth at bedtime.    . valACYclovir (VALTREX) 1000 MG tablet Take 1 tablet (1,000 mg total) by mouth 3 (three) times daily. (Patient taking differently: Take 1,000 mg by mouth 3 (three) times daily as needed. ) 21 tablet 5   No current facility-administered medications on file prior to visit.     Past Surgical History:  Procedure Laterality Date  . blocked itestinal repair  18   age 27 months  . BREAST BIOPSY Left 2017  . CHOLECYSTECTOMY  2011  . INDUCED ABORTION  1996   forced abortion  . INGUINAL HERNIA REPAIR Left 1980   age 48  . LAPAROSCOPIC ENDOMETRIOSIS FULGURATION  1998  . WISDOM TOOTH EXTRACTION  2007    Allergies  Allergen Reactions  . Macrobid [Nitrofurantoin] Hives  . Byetta 10 Mcg Pen [Exenatide] Hives  . Clindamycin/Lincomycin Nausea And Vomiting  . Geodon [Ziprasidone Hcl]   . Lipitor [Atorvastatin]     Per patient this causes cramps  . Nsaids     Upset stomach   . Penicillins Other (See Comments)    Has patient had a PCN reaction causing immediate  rash, facial/tongue/throat swelling, SOB or lightheadedness with hypotension: No Has patient had a PCN reaction causing severe rash involving mucus membranes or skin necrosis: No Has patient had a PCN reaction that required hospitalization No Has patient had a PCN reaction occurring within the last 10 years: No If all of the above answers are "NO", then may proceed with Cephalosporin use.   Marland Kitchen Avocado Diarrhea and Other (See Comments)  Severe cramping, sweats, and diarrhea in upper GI/stomach  . Tramadol Hcl Itching and Rash    Social History   Socioeconomic History  . Marital status: Divorced    Spouse name: Not on file  . Number of children: 5  . Years of education: Not on file  . Highest education level: Not on file  Occupational History  . Occupation: Nurse, learning disability  Social Needs  . Financial resource strain: Not on file  . Food insecurity:    Worry: Not on file    Inability: Not on file  . Transportation needs:    Medical: Not on file    Non-medical: Not on file  Tobacco Use  . Smoking status: Former Smoker    Packs/day: 0.50    Years: 0.50    Pack years: 0.25    Types: Cigarettes    Start date: 02/24/2018  . Smokeless tobacco: Never Used  . Tobacco comment: quit 03/25/2016  Substance and Sexual Activity  . Alcohol use: No    Alcohol/week: 0.0 standard drinks  . Drug use: Yes    Types: Marijuana    Comment: 2/10 last marijuana use  . Sexual activity: Not on file  Lifestyle  . Physical activity:    Days per week: Not on file    Minutes per session: Not on file  . Stress: Not on file  Relationships  . Social connections:    Talks on phone: Not on file    Gets together: Not on file    Attends religious service: Not on file    Active member of club or organization: Not on file    Attends meetings of clubs or organizations: Not on file    Relationship status: Not on file  . Intimate partner violence:    Fear of current or ex partner: Not on  file    Emotionally abused: Not on file    Physically abused: Not on file    Forced sexual activity: Not on file  Other Topics Concern  . Not on file  Social History Narrative  . Not on file    Family History  Adopted: Yes  Problem Relation Age of Onset  . Heart disease Unknown        on both sides of family  . Diabetes Unknown        on both sides of family    BP 113/80   Pulse 80   Ht 5\' 2"  (1.575 m)   Wt 225 lb (102.1 kg)   BMI 41.15 kg/m   Review of Systems: See HPI above.     Objective:  Physical Exam:  Gen: NAD, comfortable in exam room  Right hand/wrist: No deformity, swelling, bruising, malrotation or angulation. FROM with 5/5 strength but pain with thumb abduction and extension. TTP 1st dorsal compartment > 1st CMC.  No carpal tunnel tenderness. NVI distally. Negative tinels.  Positive finkelsteins.   Assessment & Plan:  1.  Right thumb pain- current exam suggestive more of deQuervain's tenosynovitis than arthritis at 1st [facility].  Continue thumb spica brace.  Tylenol if needed.  Injection given today.  F/u in 1 month or prn.  After informed written consent timeout was performed.  Patient was seated in chair in exam room.  Area overlying right 1st dorsal compartment prepped with alcohol swab then injected with 0.5:0.46mL bupivicaine:depomedrol.  Patient tolerated procedure well without immediate complications.

## 2018-07-26 NOTE — Telephone Encounter (Signed)
This is pretty common though it's possible she's getting a more severe steroid flare.  Bruising can occur as the needle goes through small blood vessels.  The pain usually worsens over a few days then improves as the steroid kicks in.  I'd say it's more unusual she hasn't experienced this with her other injections.

## 2018-07-29 ENCOUNTER — Ambulatory Visit (INDEPENDENT_AMBULATORY_CARE_PROVIDER_SITE_OTHER): Payer: 59 | Admitting: Psychology

## 2018-07-29 DIAGNOSIS — F4481 Dissociative identity disorder: Secondary | ICD-10-CM | POA: Diagnosis not present

## 2018-07-29 DIAGNOSIS — F4312 Post-traumatic stress disorder, chronic: Secondary | ICD-10-CM | POA: Diagnosis not present

## 2018-08-01 ENCOUNTER — Ambulatory Visit (INDEPENDENT_AMBULATORY_CARE_PROVIDER_SITE_OTHER): Payer: PRIVATE HEALTH INSURANCE | Admitting: Psychology

## 2018-08-01 DIAGNOSIS — F4312 Post-traumatic stress disorder, chronic: Secondary | ICD-10-CM

## 2018-08-01 DIAGNOSIS — F4481 Dissociative identity disorder: Secondary | ICD-10-CM

## 2018-08-04 ENCOUNTER — Encounter (HOSPITAL_BASED_OUTPATIENT_CLINIC_OR_DEPARTMENT_OTHER): Payer: Self-pay | Admitting: Emergency Medicine

## 2018-08-04 ENCOUNTER — Other Ambulatory Visit: Payer: Self-pay

## 2018-08-04 ENCOUNTER — Emergency Department (HOSPITAL_BASED_OUTPATIENT_CLINIC_OR_DEPARTMENT_OTHER)
Admission: EM | Admit: 2018-08-04 | Discharge: 2018-08-04 | Disposition: A | Discharge status: Home / Self Care | Payer: Managed Care, Other (non HMO) | Attending: Emergency Medicine | Admitting: Emergency Medicine

## 2018-08-04 DIAGNOSIS — Z79899 Other long term (current) drug therapy: Secondary | ICD-10-CM | POA: Insufficient documentation

## 2018-08-04 DIAGNOSIS — J45909 Unspecified asthma, uncomplicated: Secondary | ICD-10-CM | POA: Insufficient documentation

## 2018-08-04 DIAGNOSIS — H1032 Unspecified acute conjunctivitis, left eye: Secondary | ICD-10-CM | POA: Insufficient documentation

## 2018-08-04 DIAGNOSIS — I10 Essential (primary) hypertension: Secondary | ICD-10-CM | POA: Insufficient documentation

## 2018-08-04 MED ORDER — FLUORESCEIN SODIUM 1 MG OP STRP
ORAL_STRIP | OPHTHALMIC | Status: AC
  Filled 2018-08-04: qty 1

## 2018-08-04 MED ORDER — CYCLOPENTOLATE HCL 1 % OP SOLN
2.0000 [drp] | Freq: Once | OPHTHALMIC | Status: AC
  Administered 2018-08-04: 2 [drp] via OPHTHALMIC
  Filled 2018-08-04: qty 2

## 2018-08-04 MED ORDER — TETRACAINE HCL 0.5 % OP SOLN
2.0000 [drp] | Freq: Once | OPHTHALMIC | Status: DC
  Filled 2018-08-04: qty 4

## 2018-08-04 MED ORDER — TOBRAMYCIN 0.3 % OP SOLN
2.0000 [drp] | Freq: Once | OPHTHALMIC | Status: AC
  Administered 2018-08-04: 2 [drp] via OPHTHALMIC
  Filled 2018-08-04: qty 5

## 2018-08-04 NOTE — Discharge Instructions (Signed)
It was our pleasure to provide your ER care today - we hope that you feel better.  Avoid rubbing eye.  Leave contacts out until after symptoms have completely resolved. During day, use sunglasses as need for comfort.   Use tobrex eye drops 1-2 drops in left eye 4x/day.  Follow up with your eye doctor tomorrow - call office tomorrow morning to arrange appointment.   Return to ER if worse, new symptoms, intractable pain, increased swelling/spreading redness, other concern.

## 2018-08-04 NOTE — ED Provider Notes (Signed)
[place] EMERGENCY DEPARTMENT Provider Note   CSN: [id number] Arrival date & time: 08/04/18  2025     History   Chief Complaint Chief Complaint  Patient presents with  . Eye Pain    HPI Miranda [name] is a 42 y.o. female.  Patient c/o left eye pain and redness - states onset today after taking out contacts. Symptoms moderate, persistent, constant. Had left contacts in place for 2 days. Denies matting on lashes. +tearing. No headache. No vomiting. No change in vision. No fever or chills.   The history is provided by the patient.  Eye Pain  Pertinent negatives include no headaches.    Past Medical History:  Diagnosis Date  . Anxiety   . Arthritis   . Asthma   . Bowel obstruction ([place])   . Cardiac arrhythmia   . Depression    sees Dr. Matilde Haymaker in [place]   . Diabetes mellitus    type 2, on insulin pump, sees Dr. Loanne Drilling   . GERD (gastroesophageal reflux disease)   . Hyperlipidemia   . Hypertension   . Hypothyroidism    sees Dr. Elyse Hsu  . Insomnia   . Migraine syndrome   . Morbid obesity ([place])   . 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 Neck pain   . Neuropathy associated with endocrine disorder ([place])   . Sleep apnea with use of continuous positive airway pressure (CPAP)    Sleep apnea is resolved due to weight loss. 08/2017  . 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 Stroke [facility]) 04/2016    Patient Active Problem List   Diagnosis Date Noted  . Right hip pain 05/22/2018  . De Quervain's disease (radial styloid tenosynovitis) 04/02/2018  . Carpal tunnel syndrome, right upper limb 12/31/2017  . Elbow injury, right, initial encounter 12/16/2017  . Chronic pain of both shoulders 11/30/2017  . Type 2 diabetes mellitus without complications ([place]) 09/62/8366  . PTSD (post-traumatic stress disorder) 10/25/2016  . 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 Diabetes mellitus ([place]) 10/25/2016  . Major depressive disorder, recurrent episode,  severe, with psychosis ([place]) 10/20/2016  . 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 TIA (transient ischemic attack) 05/16/2016  . Arterial ischemic stroke, MCA, left, acute ([place]) 03/29/2016  . Plantar fasciitis 08/08/2013  . Obesity, Class III, BMI 40-49.9 (morbid obesity) ([place]) 04/09/2013  . Unspecified sleep apnea 03/31/2013  . PCO (polycystic ovaries) 04/11/2012  . Neck pain 11/21/2010  . FATTY LIVER DISEASE 01/26/2010  . NAUSEA WITH VOMITING 01/26/2010  . PORTAL HYPERTENSION 01/26/2010  . Migraine headache 06/28/2009  . BREAST MASS 08/21/2008  . KNEE PAIN 04/14/2008  . Hypothyroidism 03/17/2008  . Hyperlipidemia 03/17/2008  . DEPRESSION 03/17/2008  . NEUROPATHY 03/17/2008  . Essential hypertension 03/17/2008  . ASTHMA 03/17/2008  . GERD 03/17/2008  . Headache 03/17/2008    Past Surgical History:  Procedure Laterality Date  . blocked itestinal repair  28   age 25 months  . BREAST BIOPSY Left 2017  . CHOLECYSTECTOMY  2011  . INDUCED ABORTION  1996   forced abortion  . INGUINAL HERNIA REPAIR Left 1980   age 54  . LAPAROSCOPIC ENDOMETRIOSIS FULGURATION  1998  . WISDOM TOOTH EXTRACTION  2007     OB History    Gravida  5   Para  3   Term  1   Preterm  2   AB  2   Living  0  SAB  1   TAB  1   Ectopic  0   Multiple  1   Live Births  4            Home Medications    Prior to Admission medications   Medication Sig Start Date End Date Taking? Authorizing Provider  albuterol (PROAIR HFA) 108 (90 Base) MCG/ACT inhaler Inhale 2 puffs into the lungs every 4 (four) hours as needed for wheezing. 07/24/17   Laurey Morale, MD  ARIPiprazole (ABILIFY) 30 MG tablet Take 30 mg by mouth daily.    [provider]  budesonide-formoterol (SYMBICORT) 160-4.5 MCG/ACT inhaler Inhale 2 puffs into the lungs 2 (two) times daily. 07/24/17   Laurey Morale, MD  buPROPion (WELLBUTRIN XL) 300 MG 24 hr  tablet Take 1 tablet (300 mg total) by mouth daily. 10/28/16   Kerrie Buffalo, NP  clopidogrel (PLAVIX) 75 MG tablet Take 1 tablet (75 mg total) by mouth daily. 07/09/17   Laurey Morale, MD  cycloSPORINE (RESTASIS) 0.05 % ophthalmic emulsion Restasis 0.05 % eye drops in a dropperette    [provider]  desvenlafaxine (PRISTIQ) 50 MG 24 hr tablet 50 mg. 2 tablets daily 05/17/18   [provider]  ferrous sulfate (FER-IN-SOL) 75 (15 Fe) MG/ML SOLN Take by mouth.    [provider]  gabapentin (NEURONTIN) 100 MG capsule TAKE 1 CAPSULE(100 MG) BY MOUTH THREE TIMES DAILY 06/28/18   Hudnall, Sharyn Lull, MD  hydrOXYzine (ATARAX/VISTARIL) 50 MG tablet Take 50 mg by mouth. Take 1 tablet every night and may take 3 other times as needed    [provider]  hydrOXYzine (VISTARIL) 50 MG capsule TK UP TO 3 CS PO HS PRF SLP 07/04/18   [provider]  insulin aspart (NOVOLOG) 100 UNIT/ML injection Inject into the skin.    [provider]  levothyroxine (SYNTHROID, LEVOTHROID) 125 MCG tablet Take 1 tablet (125 mcg total) by mouth daily before breakfast. 09/26/17   Laurey Morale, MD  metoprolol succinate (TOPROL-XL) 25 MG 24 hr tablet Take 0.5 tablets (12.5 mg total) by mouth daily. 10/28/16   Kerrie Buffalo, NP  Multiple Vitamin (MULTIVITAMIN) tablet Take 1 tablet by mouth daily.    [provider]  norethindrone (MICRONOR,CAMILA,ERRIN) 0.35 MG tablet  12/28/17   [provider]  nystatin (NYSTATIN) powder Apply topically 4 (four) times daily. 06/11/18   Laurey Morale, MD  oxyCODONE-acetaminophen (PERCOCET/ROXICET) 5-325 MG tablet Take 1 tablet by mouth every 6 (six) hours as needed for severe pain. 12/13/17   Hudnall, Sharyn Lull, MD  pioglitazone (ACTOS) 45 MG tablet Take 1 tablet (45 mg total) by mouth daily. 10/28/16   Kerrie Buffalo, NP  prazosin (MINIPRESS) 5 MG capsule Take 10 mg by mouth at bedtime.    [provider]  promethazine  (PHENERGAN) 25 MG tablet Take 1 tablet (25 mg total) every 6 (six) hours as needed by mouth for nausea or vomiting. 09/15/17   Lawyer, Harrell Gave, PA-C  rizatriptan (MAXALT) 10 MG tablet Take 1 tablet (10 mg total) by mouth as needed for migraine. May repeat in 2 hours if needed 01/04/17   Laurey Morale, MD  rosuvastatin (CRESTOR) 20 MG tablet TAKE 1 TABLET(20 MG) BY MOUTH DAILY 08/24/17   Laurey Morale, MD  topiramate (TOPAMAX) 100 MG tablet Take 100 mg by mouth 2 (two) times daily.  01/21/18   [provider]  traZODone (DESYREL) 150 MG tablet Take by mouth at bedtime.  [provider]  valACYclovir (VALTREX) 1000 MG tablet Take 1 tablet (1,000 mg total) by mouth 3 (three) times daily. Patient taking differently: Take 1,000 mg by mouth 3 (three) times daily as needed.  02/25/18   Laurey Morale, MD    Family History Family History  Adopted: Yes  Problem Relation Age of Onset  . Heart disease Unknown        on both sides of family  . Diabetes Unknown        on both sides of family    Social History Social History   Tobacco Use  . Smoking status: Former Smoker    Packs/day: 0.50    Years: 0.50    Pack years: 0.25    Types: Cigarettes    Start date: 02/24/2018  . Smokeless tobacco: Never Used  . Tobacco comment: quit 03/25/2016  Substance Use Topics  . Alcohol use: No    Alcohol/week: 0.0 standard drinks  . Drug use: Yes    Types: Marijuana    Comment: 2/10 last marijuana use     Allergies   Macrobid [nitrofurantoin]; Byetta 10 mcg pen [exenatide]; Clindamycin/lincomycin; Geodon [ziprasidone hcl]; Lipitor [atorvastatin]; Nsaids; Penicillins; Avocado; and Tramadol hcl   Review of Systems Review of Systems  Constitutional: Negative for fever.  HENT: Negative for sore throat.   Eyes: Positive for pain.  Respiratory: Negative for cough.   Gastrointestinal: Negative for vomiting.  Skin: Negative for rash.  Neurological: Negative for headaches.      Physical Exam Updated Vital Signs BP 119/80 (BP Location: Left Arm)   Pulse 82   Temp 99.6 F (37.6 C) (Oral)   Resp 20   LMP 07/18/2018 (Exact Date)   SpO2 100%   Physical Exam  Constitutional: She appears well-developed and well-nourished.  HENT:  Mouth/Throat: Oropharynx is clear and moist.  Eyes: Pupils are equal, round, and reactive to light. Conjunctivae and EOM are normal. No scleral icterus.  Pupils equal, 3 mm, briskly reactive. No corneal clouding. Left conjunctiva injected.  No orbital or periorbital cellulitis. No proptosis. No pain w eom.  No rash or skin lesions to area.   Neck: Neck supple. No tracheal deviation present.  Cardiovascular: Normal rate.  Pulmonary/Chest: Effort normal. No respiratory distress.  Abdominal: Normal appearance.  Musculoskeletal: She exhibits no edema.  Neurological: She is alert.  Skin: Skin is warm and dry. No rash noted.  Psychiatric: She has a normal mood and affect.  Nursing note and vitals reviewed.    ED Treatments / Results  Labs (all labs ordered are listed, but only abnormal results are displayed) Labs Reviewed - No data to display  EKG None  Radiology No results found.  Procedures Procedures (including critical care time)  Medications Ordered in ED Medications  tetracaine (PONTOCAINE) 0.5 % ophthalmic solution 2 drop (has no administration in time range)     Initial Impression / Assessment and Plan / ED Course  I have reviewed the triage vital signs and the nursing notes.  Pertinent labs & imaging results that were available during my care of the patient were reviewed by me and considered in my medical decision making (see chart for details).  Tetracaine drops - symptoms improved.   Reviewed nursing notes and prior charts for additional history.   Lids everted. No fb. Fluorescein strain - no corneal abrasion noted.   tonopen used to check pressure, 3 readings average of  20.  ?conjuncitivitis/keratoconj vs iritis.   Cyclopentolate drops. tobrex gtts.  Pt indicates sees [facility] and will  F/u there in AM.     Final Clinical Impressions(s) / ED Diagnoses   Final diagnoses:  None    ED Discharge Orders    None       Lajean Saver, MD 08/04/18 2217

## 2018-08-04 NOTE — ED Triage Notes (Signed)
Pt is c/o severe pain in her left eye after taking her contact out about 5pm  Pt states it does not feel like a scratch but pain behind her eye like a strong pressure   Pt states she has been sick with nausea, vomiting and diarrhea for the past couple of days  Pt has hx of diabetes and stroke

## 2018-08-05 ENCOUNTER — Ambulatory Visit (INDEPENDENT_AMBULATORY_CARE_PROVIDER_SITE_OTHER): Payer: PRIVATE HEALTH INSURANCE | Admitting: Psychology

## 2018-08-05 DIAGNOSIS — F4312 Post-traumatic stress disorder, chronic: Secondary | ICD-10-CM | POA: Diagnosis not present

## 2018-08-05 DIAGNOSIS — F4481 Dissociative identity disorder: Secondary | ICD-10-CM

## 2018-08-08 ENCOUNTER — Ambulatory Visit (INDEPENDENT_AMBULATORY_CARE_PROVIDER_SITE_OTHER): Payer: PRIVATE HEALTH INSURANCE | Admitting: Psychology

## 2018-08-08 DIAGNOSIS — F4481 Dissociative identity disorder: Secondary | ICD-10-CM

## 2018-08-08 DIAGNOSIS — F4312 Post-traumatic stress disorder, chronic: Secondary | ICD-10-CM

## 2018-08-12 ENCOUNTER — Ambulatory Visit (INDEPENDENT_AMBULATORY_CARE_PROVIDER_SITE_OTHER): Payer: PRIVATE HEALTH INSURANCE | Admitting: Psychology

## 2018-08-12 DIAGNOSIS — F4481 Dissociative identity disorder: Secondary | ICD-10-CM

## 2018-08-12 DIAGNOSIS — F4312 Post-traumatic stress disorder, chronic: Secondary | ICD-10-CM

## 2018-08-15 ENCOUNTER — Ambulatory Visit (INDEPENDENT_AMBULATORY_CARE_PROVIDER_SITE_OTHER): Payer: PRIVATE HEALTH INSURANCE | Admitting: Psychology

## 2018-08-15 DIAGNOSIS — F4481 Dissociative identity disorder: Secondary | ICD-10-CM | POA: Diagnosis not present

## 2018-08-15 DIAGNOSIS — F4312 Post-traumatic stress disorder, chronic: Secondary | ICD-10-CM | POA: Diagnosis not present

## 2018-08-19 ENCOUNTER — Ambulatory Visit (INDEPENDENT_AMBULATORY_CARE_PROVIDER_SITE_OTHER): Payer: PRIVATE HEALTH INSURANCE | Admitting: Psychology

## 2018-08-19 DIAGNOSIS — F4481 Dissociative identity disorder: Secondary | ICD-10-CM | POA: Diagnosis not present

## 2018-08-19 DIAGNOSIS — F4312 Post-traumatic stress disorder, chronic: Secondary | ICD-10-CM | POA: Diagnosis not present

## 2018-08-21 ENCOUNTER — Encounter: Payer: Self-pay | Admitting: Family Medicine

## 2018-08-21 ENCOUNTER — Other Ambulatory Visit: Payer: Self-pay

## 2018-08-21 ENCOUNTER — Ambulatory Visit (INDEPENDENT_AMBULATORY_CARE_PROVIDER_SITE_OTHER): Payer: PRIVATE HEALTH INSURANCE | Admitting: Family Medicine

## 2018-08-21 ENCOUNTER — Ambulatory Visit: Payer: Self-pay | Admitting: Family Medicine

## 2018-08-21 VITALS — BP 116/70 | HR 88 | Temp 98.1°F | Ht 62.0 in | Wt 225.6 lb

## 2018-08-21 DIAGNOSIS — J209 Acute bronchitis, unspecified: Secondary | ICD-10-CM

## 2018-08-21 DIAGNOSIS — Z0289 Encounter for other administrative examinations: Secondary | ICD-10-CM

## 2018-08-21 DIAGNOSIS — J320 Chronic maxillary sinusitis: Secondary | ICD-10-CM | POA: Diagnosis not present

## 2018-08-21 MED ORDER — HYDROCODONE-HOMATROPINE 5-1.5 MG/5ML PO SYRP: 5.0000 mL | ORAL_SOLUTION | Freq: Four times a day (QID) | ORAL | 0 refills | Status: AC | PRN

## 2018-08-21 MED ORDER — AZITHROMYCIN 250 MG PO TABS: ORAL_TABLET | ORAL | 0 refills | Status: AC

## 2018-08-21 NOTE — Progress Notes (Signed)
Subjective:     Patient ID: Miranda [name], female   DOB: 1976/06/22, 42 y.o.   MRN: [id number]  HPI Patient seen with approximately 10-day history of illness.  Acute upper respiratory symptoms.  She has mostly cough at this time.  She has had frequent sinusitis in the past.  She describes left maxillary facial pain.  She has had some intermittent headaches.  Mostly clear drainage.  Cough mostly nonproductive.  Allergy to penicillin and clindamycin.  She had some leftover Hydromet which helped her cough.  No relief with over-the-counter medications.  Past Medical History:  Diagnosis Date  . Anxiety   . Arthritis   . Asthma   . Bowel obstruction ([place])   . Cardiac arrhythmia   . Depression    sees Dr. Matilde Haymaker in [place]   . Diabetes mellitus    type 2, on insulin pump, sees Dr. Loanne Drilling   . GERD (gastroesophageal reflux disease)   . Hyperlipidemia   . Hypertension   . Hypothyroidism    sees Dr. Elyse Hsu  . Insomnia   . Migraine syndrome   . Morbid obesity ([place])   . 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 Neck pain   . Neuropathy associated with endocrine disorder ([place])   . Sleep apnea with use of continuous positive airway pressure (CPAP)    Sleep apnea is resolved due to weight loss. 08/2017  . 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 Stroke [facility]) 04/2016   Past Surgical History:  Procedure Laterality Date  . blocked itestinal repair  39   age 16 months  . BREAST BIOPSY Left 2017  . CHOLECYSTECTOMY  2011  . INDUCED ABORTION  1996   forced abortion  . INGUINAL HERNIA REPAIR Left 1980   age 75  . LAPAROSCOPIC ENDOMETRIOSIS FULGURATION  1998  . WISDOM TOOTH EXTRACTION  2007    reports that she has quit smoking. Her smoking use included cigarettes. She started smoking about 5 months ago. She has a 0.25 pack-year smoking history. She has never used smokeless tobacco. She reports that she has current or past drug history. Drug: Marijuana. She reports that she does not drink  alcohol. family history includes Diabetes in her unknown relative; Heart disease in her unknown relative. She was adopted. Allergies  Allergen Reactions  . Macrobid [Nitrofurantoin] Hives  . Byetta 10 Mcg Pen [Exenatide] Hives  . Clindamycin/Lincomycin Nausea And Vomiting  . Geodon [Ziprasidone Hcl]   . Lipitor [Atorvastatin]     Per patient this causes cramps  . Nsaids     Upset stomach   . Penicillins Other (See Comments)    Has patient had a PCN reaction causing immediate rash, facial/tongue/throat swelling, SOB or lightheadedness with hypotension: No Has patient had a PCN reaction causing severe rash involving mucus membranes or skin necrosis: No Has patient had a PCN reaction that required hospitalization No Has patient had a PCN reaction occurring within the last 10 years: No If all of the above answers are "NO", then may proceed with Cephalosporin use.   Marland Kitchen Avocado Diarrhea and Other (See Comments)    Severe cramping, sweats, and diarrhea in upper GI/stomach  . Tramadol Hcl Itching and Rash     Review of Systems  Constitutional: Positive for fatigue. Negative for chills and fever.  HENT: Positive for congestion, sinus pressure and sinus pain.   Respiratory: Positive for cough. Negative for shortness of breath.   Neurological: Positive for headaches.  Objective:   Physical Exam  Constitutional: She appears well-developed and well-nourished.  HENT:  Right Ear: External ear normal.  Left Ear: External ear normal.  Mouth/Throat: Oropharynx is clear and moist.  Neck: Neck supple.  Cardiovascular: Normal rate and regular rhythm.  Pulmonary/Chest: Effort normal and breath sounds normal. She has no wheezes. She has no rales.  Lymphadenopathy:    She has no cervical adenopathy.       Assessment:     Acute upper respiratory infection.  Possible early left maxillary sinusitis    Plan:     -Hycodan cough syrup 120 mL's 1 teaspoon every 6 hours as needed for severe  cough -We printed prescription for Zithromax but she will hold at this point and only fill if her symptoms persist or worsen -Follow-up for any fever or any worsening or persistent symptoms  Eulas Post MD [facility]

## 2018-08-21 NOTE — Patient Instructions (Signed)

## 2018-08-22 ENCOUNTER — Ambulatory Visit (INDEPENDENT_AMBULATORY_CARE_PROVIDER_SITE_OTHER): Payer: PRIVATE HEALTH INSURANCE | Admitting: Psychology

## 2018-08-22 ENCOUNTER — Ambulatory Visit (INDEPENDENT_AMBULATORY_CARE_PROVIDER_SITE_OTHER): Payer: PRIVATE HEALTH INSURANCE | Admitting: Family Medicine

## 2018-08-22 ENCOUNTER — Encounter: Payer: Self-pay | Admitting: Family Medicine

## 2018-08-22 ENCOUNTER — Telehealth: Payer: Self-pay | Admitting: Family Medicine

## 2018-08-22 VITALS — BP 111/75 | HR 79 | Ht 62.0 in | Wt 225.0 lb

## 2018-08-22 DIAGNOSIS — F4481 Dissociative identity disorder: Secondary | ICD-10-CM

## 2018-08-22 DIAGNOSIS — M25562 Pain in left knee: Secondary | ICD-10-CM

## 2018-08-22 DIAGNOSIS — M654 Radial styloid tenosynovitis [de Quervain]: Secondary | ICD-10-CM

## 2018-08-22 DIAGNOSIS — F4312 Post-traumatic stress disorder, chronic: Secondary | ICD-10-CM | POA: Diagnosis not present

## 2018-08-22 DIAGNOSIS — G8929 Other chronic pain: Secondary | ICD-10-CM

## 2018-08-22 DIAGNOSIS — M25561 Pain in right knee: Secondary | ICD-10-CM | POA: Diagnosis not present

## 2018-08-22 NOTE — Telephone Encounter (Signed)
Copied from [place] [id number]. Topic: General - Other >> Aug 22, 2018  1:00 PM Lennox Solders wrote: Reason for CRM: pt is calling and would like a work note from 08/21/18 and to return to work on 08-26-18 due to cough medication she can not be driving, pt saw dr Elease Hashimoto yesterday

## 2018-08-22 NOTE — Telephone Encounter (Signed)
Pt is requesting this be faxed to her work if possible.   Attn: Larena Sox regarding [facility]  [phone]

## 2018-08-22 NOTE — Patient Instructions (Signed)
When you finish physical therapy, continue the home exercises for your knees as long as you're having pain Walking for exercise would be good but make sure you're doing it on a short course not too far away from your car or house so you can get back if you have trouble. Tylenol, topical medications only if needed. Follow up with me as needed.

## 2018-08-25 ENCOUNTER — Encounter: Payer: Self-pay | Admitting: Family Medicine

## 2018-08-25 NOTE — Progress Notes (Signed)
PCP: Laurey Morale, MD  Subjective:   CC: Bilateral knee pain, right wrist pain.  HPI:  9/9: Patient is a 42 y.o. female here for follow-up for right hip and right knee pain.  Overall her right lateral hip she feels is doing fairly well.  She reports occasional 3/10 pain mostly when she crosses her legs at the ankles.  She denies any radiation of the pain  She denies any new injuries.  She has been doing home exercise strengthening which she feels has been going well.  With regards to her right knee pain, she feels like she is doing slightly worse.  She also has similar pain in the left knee.  She reports 6/10 medial pain bilaterally .  Initially she was having some improvement but has now worsened with activity.  Her pain is worse with stairs and any prolonged activity while she is standing.  She reports feeling occasional stiffness and swelling.  She denies any erythema or bruising.  She denies any locking or feeling of instability.  She has been wearing a hinged knee brace which has not been very helpful but she feels it is too large.  She denies any numbness or tingling distally.  Patient also notes concerns of pain in the wrist.  Previous diagnosis of de Quervain's tenosynovitis.  She is thumb spica which is somewhat uncomfortable for her.  She localizes pain at the base of the right thumb.  Pain with extension.  She denies any erythema, bruising with associated skin changes.  No numbness or tingling in the hand.  9/26: Patient returns today for injection to right wrist. Pain is 5/10 and has continued despite bracing. No skin changes, numbness.  10/24: Patient reports she's doing very well. Taking it carefully on steps but otherwise nor many issues. Pain currently 0/10 here and right wrist. No skin changes, numbness.  Past Medical History:  Diagnosis Date  . Anxiety   . Arthritis   . Asthma   . Bowel obstruction ([place])   . Cardiac arrhythmia   . Depression    sees Dr. Matilde Haymaker  in [place]   . Diabetes mellitus    type 2, on insulin pump, sees Dr. Loanne Drilling   . GERD (gastroesophageal reflux disease)   . Hyperlipidemia   . Hypertension   . Hypothyroidism    sees Dr. Elyse Hsu  . Insomnia   . Migraine syndrome   . Morbid obesity ([place])   . 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 Neck pain   . Neuropathy associated with endocrine disorder ([place])   . Sleep apnea with use of continuous positive airway pressure (CPAP)    Sleep apnea is resolved due to weight loss. 08/2017  . 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 Stroke [facility]) 04/2016    Current Outpatient Medications on File Prior to Visit  Medication Sig Dispense Refill  . albuterol (PROAIR HFA) 108 (90 Base) MCG/ACT inhaler Inhale 2 puffs into the lungs every 4 (four) hours as needed for wheezing. 8.5 g 11  . ARIPiprazole (ABILIFY) 30 MG tablet Take 30 mg by mouth daily.    Marland Kitchen azithromycin (ZITHROMAX Z-PAK) 250 MG tablet Take 2 tablets (500 mg) on  Day 1,  followed by 1 tablet (250 mg) once daily on Days 2 through 5. 6 each 0  . budesonide-formoterol (SYMBICORT) 160-4.5 MCG/ACT inhaler Inhale 2 puffs into the lungs 2 (two) times daily. 1 Inhaler 11  . buPROPion (WELLBUTRIN XL) 300 MG 24 hr tablet  Take 1 tablet (300 mg total) by mouth daily. 30 tablet 0  . clopidogrel (PLAVIX) 75 MG tablet Take 1 tablet (75 mg total) by mouth daily. 90 tablet 3  . cycloSPORINE (RESTASIS) 0.05 % ophthalmic emulsion Restasis 0.05 % eye drops in a dropperette    . desvenlafaxine (PRISTIQ) 50 MG 24 hr tablet 50 mg. 2 tablets daily  0  . ferrous sulfate (FER-IN-SOL) 75 (15 Fe) MG/ML SOLN Take by mouth.    . gabapentin (NEURONTIN) 100 MG capsule TAKE 1 CAPSULE(100 MG) BY MOUTH THREE TIMES DAILY 90 capsule 2  . HYDROcodone-homatropine (HYCODAN) 5-1.5 MG/5ML syrup Take 5 mLs by mouth every 6 (six) hours as needed for up to 10 days. 120 mL 0  . hydrOXYzine (ATARAX/VISTARIL) 50 MG tablet Take 50 mg by mouth. Take 1 tablet every night and may take 3  other times as needed    . insulin aspart (NOVOLOG) 100 UNIT/ML injection Inject into the skin.    Marland Kitchen levothyroxine (SYNTHROID, LEVOTHROID) 125 MCG tablet Take 1 tablet (125 mcg total) by mouth daily before breakfast. 90 tablet 3  . metoprolol succinate (TOPROL-XL) 25 MG 24 hr tablet Take 0.5 tablets (12.5 mg total) by mouth daily. 30 tablet 0  . Multiple Vitamin (MULTIVITAMIN) tablet Take 1 tablet by mouth daily.    . norethindrone (MICRONOR,CAMILA,ERRIN) 0.35 MG tablet     . nystatin (NYSTATIN) powder Apply topically 4 (four) times daily. 60 g 5  . oxyCODONE-acetaminophen (PERCOCET/ROXICET) 5-325 MG tablet Take 1 tablet by mouth every 6 (six) hours as needed for severe pain. 20 tablet 0  . pioglitazone (ACTOS) 45 MG tablet Take 1 tablet (45 mg total) by mouth daily. 30 tablet 0  . prazosin (MINIPRESS) 5 MG capsule Take 10 mg by mouth at bedtime.    . promethazine (PHENERGAN) 25 MG tablet Take 1 tablet (25 mg total) every 6 (six) hours as needed by mouth for nausea or vomiting. 10 tablet 0  . rizatriptan (MAXALT) 10 MG tablet Take 1 tablet (10 mg total) by mouth as needed for migraine. May repeat in 2 hours if needed 30 tablet 5  . rosuvastatin (CRESTOR) 20 MG tablet TAKE 1 TABLET(20 MG) BY MOUTH DAILY 30 tablet 11  . topiramate (TOPAMAX) 100 MG tablet Take 100 mg by mouth 2 (two) times daily.     . traZODone (DESYREL) 150 MG tablet Take by mouth at bedtime.    . valACYclovir (VALTREX) 1000 MG tablet Take 1 tablet (1,000 mg total) by mouth 3 (three) times daily. (Patient taking differently: Take 1,000 mg by mouth 3 (three) times daily as needed. ) 21 tablet 5   No current facility-administered medications on file prior to visit.     Past Surgical History:  Procedure Laterality Date  . blocked itestinal repair  75   age 84 months  . BREAST BIOPSY Left 2017  . CHOLECYSTECTOMY  2011  . INDUCED ABORTION  1996   forced abortion  . INGUINAL HERNIA REPAIR Left 1980   age 84  . LAPAROSCOPIC  ENDOMETRIOSIS FULGURATION  1998  . WISDOM TOOTH EXTRACTION  2007    Allergies  Allergen Reactions  . Macrobid [Nitrofurantoin] Hives  . Byetta 10 Mcg Pen [Exenatide] Hives  . Clindamycin/Lincomycin Nausea And Vomiting  . Geodon [Ziprasidone Hcl]   . Lipitor [Atorvastatin]     Per patient this causes cramps  . Nsaids     Upset stomach   . Penicillins Other (See Comments)    Has patient had  a PCN reaction causing immediate rash, facial/tongue/throat swelling, SOB or lightheadedness with hypotension: No Has patient had a PCN reaction causing severe rash involving mucus membranes or skin necrosis: No Has patient had a PCN reaction that required hospitalization No Has patient had a PCN reaction occurring within the last 10 years: No If all of the above answers are "NO", then may proceed with Cephalosporin use.   Marland Kitchen Avocado Diarrhea and Other (See Comments)    Severe cramping, sweats, and diarrhea in upper GI/stomach  . Tramadol Hcl Itching and Rash    Social History   Socioeconomic History  . Marital status: Divorced    Spouse name: Not on file  . Number of children: 5  . Years of education: Not on file  . Highest education level: Not on file  Occupational History  . Occupation: Nurse, learning disability  Social Needs  . Financial resource strain: Not on file  . Food insecurity:    Worry: Not on file    Inability: Not on file  . Transportation needs:    Medical: Not on file    Non-medical: Not on file  Tobacco Use  . Smoking status: Former Smoker    Packs/day: 0.50    Years: 0.50    Pack years: 0.25    Types: Cigarettes    Start date: 02/24/2018  . Smokeless tobacco: Never Used  . Tobacco comment: quit 03/25/2016  Substance and Sexual Activity  . Alcohol use: No    Alcohol/week: 0.0 standard drinks  . Drug use: Yes    Types: Marijuana    Comment: 2/10 last marijuana use  . Sexual activity: Not on file  Lifestyle  . Physical activity:    Days per week: Not  on file    Minutes per session: Not on file  . Stress: Not on file  Relationships  . Social connections:    Talks on phone: Not on file    Gets together: Not on file    Attends religious service: Not on file    Active member of club or organization: Not on file    Attends meetings of clubs or organizations: Not on file    Relationship status: Not on file  . Intimate partner violence:    Fear of current or ex partner: Not on file    Emotionally abused: Not on file    Physically abused: Not on file    Forced sexual activity: Not on file  Other Topics Concern  . Not on file  Social History Narrative  . Not on file    Family History  Adopted: Yes  Problem Relation Age of Onset  . Heart disease Unknown        on both sides of family  . Diabetes Unknown        on both sides of family    BP 111/75   Pulse 79   Ht 5\' 2"  (1.575 m)   Wt 225 lb (102.1 kg)   BMI 41.15 kg/m   Review of Systems: See HPI above.     Objective:  Physical Exam:  Gen: NAD, comfortable in exam room  Bilateral knees: No gross deformity, ecchymoses, swelling. Mild TTP medial joint lines. FROM with 5/5 strength flexion and extension. Negative ant/post drawers. Negative valgus/varus testing. Negative lachmans. Negative mcmurrays, apleys. NV intact distally.  Right hand/wrist: No deformity. FROM with 5/5 strength. No tenderness to palpation. NVI distally. Negative finkelsteins.   Assessment & Plan:  1.  Right thumb pain -  2/2 dequervain's tenosynovitis.  Much improved following injection.  Tylenol, topical medications, thumb spica brace only if needed.  2. Bilateral knee pain - clinically improved with mild tenderness only on exam.  Continue PT, transition to HEP.  Discussed exercise program.  Tylenol, topical medications if needed.  F/u prn.

## 2018-08-26 NOTE — Telephone Encounter (Signed)
Dr. Elease Hashimoto, please advise if you are ok with doing a work excuse for this pt of Dr. Barbie Banner that you saw 10/23.  Thanks

## 2018-08-26 NOTE — Telephone Encounter (Signed)
Work note has been printed out and will be faxed per pts request.

## 2018-08-26 NOTE — Telephone Encounter (Signed)
OK to provide work note as requested

## 2018-08-29 ENCOUNTER — Ambulatory Visit (INDEPENDENT_AMBULATORY_CARE_PROVIDER_SITE_OTHER): Payer: PRIVATE HEALTH INSURANCE | Admitting: Psychology

## 2018-08-29 DIAGNOSIS — F4481 Dissociative identity disorder: Secondary | ICD-10-CM

## 2018-08-29 DIAGNOSIS — F4312 Post-traumatic stress disorder, chronic: Secondary | ICD-10-CM

## 2018-09-02 ENCOUNTER — Ambulatory Visit (INDEPENDENT_AMBULATORY_CARE_PROVIDER_SITE_OTHER): Payer: PRIVATE HEALTH INSURANCE | Admitting: Psychology

## 2018-09-02 DIAGNOSIS — F4312 Post-traumatic stress disorder, chronic: Secondary | ICD-10-CM

## 2018-09-02 DIAGNOSIS — F4481 Dissociative identity disorder: Secondary | ICD-10-CM

## 2018-09-05 ENCOUNTER — Ambulatory Visit (INDEPENDENT_AMBULATORY_CARE_PROVIDER_SITE_OTHER): Payer: PRIVATE HEALTH INSURANCE | Admitting: Psychology

## 2018-09-05 DIAGNOSIS — F4312 Post-traumatic stress disorder, chronic: Secondary | ICD-10-CM

## 2018-09-05 DIAGNOSIS — F4481 Dissociative identity disorder: Secondary | ICD-10-CM

## 2018-09-09 ENCOUNTER — Ambulatory Visit (INDEPENDENT_AMBULATORY_CARE_PROVIDER_SITE_OTHER): Payer: PRIVATE HEALTH INSURANCE | Admitting: Psychology

## 2018-09-09 DIAGNOSIS — F4481 Dissociative identity disorder: Secondary | ICD-10-CM | POA: Diagnosis not present

## 2018-09-09 DIAGNOSIS — F4312 Post-traumatic stress disorder, chronic: Secondary | ICD-10-CM

## 2018-09-12 ENCOUNTER — Ambulatory Visit (INDEPENDENT_AMBULATORY_CARE_PROVIDER_SITE_OTHER): Payer: PRIVATE HEALTH INSURANCE | Admitting: Psychology

## 2018-09-12 DIAGNOSIS — F4481 Dissociative identity disorder: Secondary | ICD-10-CM

## 2018-09-12 DIAGNOSIS — F4312 Post-traumatic stress disorder, chronic: Secondary | ICD-10-CM | POA: Diagnosis not present

## 2018-09-16 ENCOUNTER — Ambulatory Visit (INDEPENDENT_AMBULATORY_CARE_PROVIDER_SITE_OTHER): Payer: PRIVATE HEALTH INSURANCE | Admitting: Psychology

## 2018-09-16 ENCOUNTER — Other Ambulatory Visit: Payer: Self-pay | Admitting: Family Medicine

## 2018-09-16 DIAGNOSIS — F4481 Dissociative identity disorder: Secondary | ICD-10-CM

## 2018-09-16 DIAGNOSIS — F4312 Post-traumatic stress disorder, chronic: Secondary | ICD-10-CM | POA: Diagnosis not present

## 2018-09-19 ENCOUNTER — Ambulatory Visit (INDEPENDENT_AMBULATORY_CARE_PROVIDER_SITE_OTHER): Payer: PRIVATE HEALTH INSURANCE | Admitting: Psychology

## 2018-09-19 DIAGNOSIS — F4481 Dissociative identity disorder: Secondary | ICD-10-CM | POA: Diagnosis not present

## 2018-09-19 DIAGNOSIS — F4312 Post-traumatic stress disorder, chronic: Secondary | ICD-10-CM | POA: Diagnosis not present

## 2018-09-23 ENCOUNTER — Ambulatory Visit (INDEPENDENT_AMBULATORY_CARE_PROVIDER_SITE_OTHER): Payer: PRIVATE HEALTH INSURANCE | Admitting: Psychology

## 2018-09-23 DIAGNOSIS — F4481 Dissociative identity disorder: Secondary | ICD-10-CM

## 2018-09-23 DIAGNOSIS — F4312 Post-traumatic stress disorder, chronic: Secondary | ICD-10-CM | POA: Diagnosis not present

## 2018-09-26 ENCOUNTER — Other Ambulatory Visit: Payer: Self-pay | Admitting: Family Medicine

## 2018-09-30 ENCOUNTER — Ambulatory Visit: Payer: PRIVATE HEALTH INSURANCE | Admitting: Psychology

## 2018-09-30 ENCOUNTER — Ambulatory Visit (INDEPENDENT_AMBULATORY_CARE_PROVIDER_SITE_OTHER): Payer: PRIVATE HEALTH INSURANCE | Admitting: Psychology

## 2018-09-30 DIAGNOSIS — F4481 Dissociative identity disorder: Secondary | ICD-10-CM | POA: Diagnosis not present

## 2018-09-30 DIAGNOSIS — F4312 Post-traumatic stress disorder, chronic: Secondary | ICD-10-CM | POA: Diagnosis not present

## 2018-10-01 ENCOUNTER — Ambulatory Visit: Payer: PRIVATE HEALTH INSURANCE | Admitting: Family Medicine

## 2018-10-01 ENCOUNTER — Encounter: Payer: Self-pay | Admitting: Family Medicine

## 2018-10-01 ENCOUNTER — Ambulatory Visit: Payer: Self-pay | Admitting: Family Medicine

## 2018-10-01 VITALS — BP 110/70 | HR 89 | Temp 98.5°F | Wt 221.1 lb

## 2018-10-01 DIAGNOSIS — Z23 Encounter for immunization: Secondary | ICD-10-CM

## 2018-10-01 DIAGNOSIS — L02422 Furuncle of left axilla: Secondary | ICD-10-CM

## 2018-10-01 DIAGNOSIS — Z794 Long term (current) use of insulin: Secondary | ICD-10-CM | POA: Diagnosis not present

## 2018-10-01 DIAGNOSIS — E1140 Type 2 diabetes mellitus with diabetic neuropathy, unspecified: Secondary | ICD-10-CM | POA: Insufficient documentation

## 2018-10-01 MED ORDER — DOXYCYCLINE HYCLATE 100 MG PO CAPS: 100.0000 mg | ORAL_CAPSULE | Freq: Two times a day (BID) | ORAL | 0 refills | Status: AC

## 2018-10-01 NOTE — Progress Notes (Signed)
   Subjective:    Patient ID: Miranda [name], female    DOB: Nov 22, 1975, 42 y.o.   MRN: [id number]  HPI Here for 2 weeks of a tender lump in the left armpit. This goes up and down at times. Using hot compresses.    Review of Systems  Constitutional: Negative.   Respiratory: Negative.   Cardiovascular: Negative.        Objective:   Physical Exam  Constitutional: She appears well-developed and well-nourished.  Cardiovascular: Normal rate, regular rhythm, normal heart sounds and intact distal pulses.  Pulmonary/Chest: Effort normal and breath sounds normal.  Skin:  There is a small tender mobile lump under the skin in the left axilla           Assessment & Plan:  Boil, treat with Doxycycline. Alysia Penna, MD

## 2018-10-03 ENCOUNTER — Ambulatory Visit (INDEPENDENT_AMBULATORY_CARE_PROVIDER_SITE_OTHER): Payer: PRIVATE HEALTH INSURANCE | Admitting: Psychology

## 2018-10-03 DIAGNOSIS — F4312 Post-traumatic stress disorder, chronic: Secondary | ICD-10-CM

## 2018-10-03 DIAGNOSIS — F4481 Dissociative identity disorder: Secondary | ICD-10-CM | POA: Diagnosis not present

## 2018-10-07 ENCOUNTER — Ambulatory Visit (INDEPENDENT_AMBULATORY_CARE_PROVIDER_SITE_OTHER): Payer: PRIVATE HEALTH INSURANCE | Admitting: Psychology

## 2018-10-07 DIAGNOSIS — F4481 Dissociative identity disorder: Secondary | ICD-10-CM | POA: Diagnosis not present

## 2018-10-07 DIAGNOSIS — F4312 Post-traumatic stress disorder, chronic: Secondary | ICD-10-CM | POA: Diagnosis not present

## 2018-10-10 ENCOUNTER — Ambulatory Visit (INDEPENDENT_AMBULATORY_CARE_PROVIDER_SITE_OTHER): Payer: PRIVATE HEALTH INSURANCE | Admitting: Psychology

## 2018-10-10 DIAGNOSIS — F4481 Dissociative identity disorder: Secondary | ICD-10-CM | POA: Diagnosis not present

## 2018-10-10 DIAGNOSIS — F4312 Post-traumatic stress disorder, chronic: Secondary | ICD-10-CM

## 2018-10-14 ENCOUNTER — Encounter: Payer: Self-pay | Admitting: Family Medicine

## 2018-10-14 ENCOUNTER — Ambulatory Visit (INDEPENDENT_AMBULATORY_CARE_PROVIDER_SITE_OTHER): Payer: PRIVATE HEALTH INSURANCE | Admitting: Family Medicine

## 2018-10-14 ENCOUNTER — Ambulatory Visit (INDEPENDENT_AMBULATORY_CARE_PROVIDER_SITE_OTHER): Payer: PRIVATE HEALTH INSURANCE | Admitting: Psychology

## 2018-10-14 VITALS — BP 111/78 | HR 95 | Ht 62.0 in | Wt 217.0 lb

## 2018-10-14 DIAGNOSIS — M549 Dorsalgia, unspecified: Secondary | ICD-10-CM | POA: Diagnosis not present

## 2018-10-14 DIAGNOSIS — F4481 Dissociative identity disorder: Secondary | ICD-10-CM

## 2018-10-14 DIAGNOSIS — F4312 Post-traumatic stress disorder, chronic: Secondary | ICD-10-CM

## 2018-10-14 NOTE — Progress Notes (Signed)
PCP: Laurey Morale, MD  Subjective:   HPI: Patient is a 42 y.o. female here for upper back pain.  Patient denies known injury or trauma. She states on Saturday during the day she developed a stabbing pain between her scapulae more on the left side. Has tried heat/ice and robaxin without much benefit. Pain level 7/10, sharp. Constant dull ache in this area though. Worse with reaching. No numbness, skin changes.  Past Medical History:  Diagnosis Date  . Anxiety   . Arthritis   . Asthma   . Bowel obstruction ([place])   . Cardiac arrhythmia   . Depression    sees Dr. Matilde Haymaker in [place]   . Diabetes mellitus    type 2   . GERD (gastroesophageal reflux disease)   . Hyperlipidemia   . Hypertension   . Hypothyroidism    sees Dr. Elyse Hsu  . Insomnia   . Migraine syndrome   . Morbid obesity ([place])   . 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 Neck pain   . Neuropathy associated with endocrine disorder ([place])   . Sleep apnea with use of continuous positive airway pressure (CPAP)    Sleep apnea is resolved due to weight loss. 08/2017  . 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 Stroke [facility]) 04/2016    Current Outpatient Medications on File Prior to Visit  Medication Sig Dispense Refill  . albuterol (PROAIR HFA) 108 (90 Base) MCG/ACT inhaler Inhale 2 puffs into the lungs every 4 (four) hours as needed for wheezing. 8.5 g 11  . ARIPiprazole (ABILIFY) 30 MG tablet Take 30 mg by mouth daily.    . budesonide-formoterol (SYMBICORT) 160-4.5 MCG/ACT inhaler Inhale 2 puffs into the lungs 2 (two) times daily. 1 Inhaler 11  . buPROPion (WELLBUTRIN XL) 300 MG 24 hr tablet Take 1 tablet (300 mg total) by mouth daily. 30 tablet 0  . clopidogrel (PLAVIX) 75 MG tablet TAKE 1 TABLET(75 MG) BY MOUTH DAILY 90 tablet 0  . gabapentin (NEURONTIN) 100 MG capsule TAKE 1 CAPSULE(100 MG) BY MOUTH THREE TIMES DAILY 90 capsule 5  . hydrOXYzine (ATARAX/VISTARIL) 50 MG tablet Take 50 mg by mouth. Take 1 tablet every  night and may take 3 other times as needed    . insulin aspart (NOVOLOG) 100 UNIT/ML injection Inject into the skin.    Marland Kitchen levothyroxine (SYNTHROID, LEVOTHROID) 125 MCG tablet Take 1 tablet (125 mcg total) by mouth daily before breakfast. (Patient not taking: Reported on 10/01/2018) 90 tablet 3  . metoprolol succinate (TOPROL-XL) 25 MG 24 hr tablet Take 0.5 tablets (12.5 mg total) by mouth daily. 30 tablet 0  . Multiple Vitamin (MULTIVITAMIN) tablet Take 1 tablet by mouth daily.    . norethindrone (MICRONOR,CAMILA,ERRIN) 0.35 MG tablet     . nystatin (NYSTATIN) powder Apply topically 4 (four) times daily. 60 g 5  . oxyCODONE-acetaminophen (PERCOCET/ROXICET) 5-325 MG tablet Take 1 tablet by mouth every 6 (six) hours as needed for severe pain. (Patient not taking: Reported on 10/01/2018) 20 tablet 0  . pioglitazone (ACTOS) 45 MG tablet Take 1 tablet (45 mg total) by mouth daily. (Patient not taking: Reported on 10/01/2018) 30 tablet 0  . prazosin (MINIPRESS) 5 MG capsule Take 10 mg by mouth at bedtime.    . promethazine (PHENERGAN) 25 MG tablet Take 1 tablet (25 mg total) every 6 (six) hours as needed by mouth for nausea or vomiting. 10 tablet 0  . rizatriptan (MAXALT) 10 MG tablet Take 1 tablet (  10 mg total) by mouth as needed for migraine. May repeat in 2 hours if needed 30 tablet 5  . rosuvastatin (CRESTOR) 20 MG tablet TAKE 1 TABLET(20 MG) BY MOUTH DAILY 30 tablet 0  . topiramate (TOPAMAX) 100 MG tablet Take 100 mg by mouth 2 (two) times daily.     . traZODone (DESYREL) 150 MG tablet Take by mouth at bedtime.    . valACYclovir (VALTREX) 1000 MG tablet Take 1 tablet (1,000 mg total) by mouth 3 (three) times daily. (Patient taking differently: Take 1,000 mg by mouth 3 (three) times daily as needed. ) 21 tablet 5   No current facility-administered medications on file prior to visit.     Past Surgical History:  Procedure Laterality Date  . blocked itestinal repair  73   age 68 months  . BREAST  BIOPSY Left 2017  . CHOLECYSTECTOMY  2011  . INDUCED ABORTION  1996   forced abortion  . INGUINAL HERNIA REPAIR Left 1980   age 38  . LAPAROSCOPIC ENDOMETRIOSIS FULGURATION  1998  . WISDOM TOOTH EXTRACTION  2007    Allergies  Allergen Reactions  . Macrobid [Nitrofurantoin] Hives  . Byetta 10 Mcg Pen [Exenatide] Hives  . Clindamycin/Lincomycin Nausea And Vomiting  . Geodon [Ziprasidone Hcl]   . Lipitor [Atorvastatin]     Per patient this causes cramps  . Nsaids     Upset stomach   . Penicillins Other (See Comments)    Has patient had a PCN reaction causing immediate rash, facial/tongue/throat swelling, SOB or lightheadedness with hypotension: No Has patient had a PCN reaction causing severe rash involving mucus membranes or skin necrosis: No Has patient had a PCN reaction that required hospitalization No Has patient had a PCN reaction occurring within the last 10 years: No If all of the above answers are "NO", then may proceed with Cephalosporin use.   Marland Kitchen Avocado Diarrhea and Other (See Comments)    Severe cramping, sweats, and diarrhea in upper GI/stomach  . Tramadol Hcl Itching and Rash    Social History   Socioeconomic History  . Marital status: Divorced    Spouse name: Not on file  . Number of children: 5  . Years of education: Not on file  . Highest education level: Not on file  Occupational History  . Occupation: Nurse, learning disability  Social Needs  . Financial resource strain: Not on file  . Food insecurity:    Worry: Not on file    Inability: Not on file  . Transportation needs:    Medical: Not on file    Non-medical: Not on file  Tobacco Use  . Smoking status: Former Smoker    Packs/day: 0.50    Years: 0.50    Pack years: 0.25    Types: Cigarettes    Start date: 02/24/2018  . Smokeless tobacco: Never Used  . Tobacco comment: quit 03/25/2016  Substance and Sexual Activity  . Alcohol use: No    Alcohol/week: 0.0 standard drinks  . Drug use:  Yes    Types: Marijuana    Comment: 2/10 last marijuana use  . Sexual activity: Not on file  Lifestyle  . Physical activity:    Days per week: Not on file    Minutes per session: Not on file  . Stress: Not on file  Relationships  . Social connections:    Talks on phone: Not on file    Gets together: Not on file    Attends religious service: Not on  file    Active member of club or organization: Not on file    Attends meetings of clubs or organizations: Not on file    Relationship status: Not on file  . Intimate partner violence:    Fear of current or ex partner: Not on file    Emotionally abused: Not on file    Physically abused: Not on file    Forced sexual activity: Not on file  Other Topics Concern  . Not on file  Social History Narrative  . Not on file    Family History  Adopted: Yes  Problem Relation Age of Onset  . Heart disease Unknown        on both sides of family  . Diabetes Unknown        on both sides of family    BP 111/78   Pulse 95   Ht 5\' 2"  (1.575 m)   Wt 217 lb (98.4 kg)   BMI 39.69 kg/m   Review of Systems: See HPI above.     Objective:  Physical Exam:  Gen: NAD, comfortable in exam room  Upper back: No deformity. FROM with 5/5 strength. Tenderness to palpation inferior trapezius muscles and rhomboids. NVI distally.  Bilateral shoulders: No swelling, ecchymoses.  No gross deformity. No TTP. FROM with mild pain on left abduction. Negative Hawkins, Neers. Negative Yergasons. Strength 5/5 with empty can and resisted internal/external rotation. Negative apprehension.  Pain with empty can. NV intact distally.   Assessment & Plan:  1. Upper back pain - 2/2 strain of rhomboids and trapezius muscles.  Icing, aspercreme or biofreeze.  Try tizanidine instead of the robaxin.  Home exercises reviewed.  F/u in 2 weeks if still struggling.

## 2018-10-14 NOTE — Patient Instructions (Signed)
You have strained your rhomboids and part of your trapezius muscle. Ice for another day 15 minutes at a time then consider switching to heat. Aspercreme or biofreeze up to 4 times a day topically. Start the two stretches I showed you - do 3 sets of 10 if you can twice a day until pain has resolved. Try the tizanidine you have as needed. Massage is ok but I think you'll get better without this. Follow up with me in 2 weeks if you're still struggling.

## 2018-10-15 ENCOUNTER — Ambulatory Visit (INDEPENDENT_AMBULATORY_CARE_PROVIDER_SITE_OTHER): Payer: PRIVATE HEALTH INSURANCE | Admitting: Psychology

## 2018-10-15 DIAGNOSIS — F4481 Dissociative identity disorder: Secondary | ICD-10-CM

## 2018-10-15 DIAGNOSIS — F4310 Post-traumatic stress disorder, unspecified: Secondary | ICD-10-CM

## 2018-10-17 ENCOUNTER — Ambulatory Visit (INDEPENDENT_AMBULATORY_CARE_PROVIDER_SITE_OTHER): Payer: PRIVATE HEALTH INSURANCE | Admitting: Psychology

## 2018-10-17 DIAGNOSIS — F4312 Post-traumatic stress disorder, chronic: Secondary | ICD-10-CM | POA: Diagnosis not present

## 2018-10-17 DIAGNOSIS — F4481 Dissociative identity disorder: Secondary | ICD-10-CM | POA: Diagnosis not present

## 2018-10-21 ENCOUNTER — Ambulatory Visit: Payer: PRIVATE HEALTH INSURANCE | Admitting: Psychology

## 2018-10-24 ENCOUNTER — Ambulatory Visit: Payer: PRIVATE HEALTH INSURANCE | Admitting: Psychology

## 2018-10-24 ENCOUNTER — Other Ambulatory Visit: Payer: Self-pay | Admitting: Family Medicine

## 2018-10-24 NOTE — Telephone Encounter (Signed)
Copied from [place] [id number]. Topic: Quick Communication - Rx Refill/Question >> Oct 24, 2018  4:49 PM Waylan Rocher, [place] L wrote: Medication: novolog 100 unit/ML (endocrinology cant see her until 11/07/2018)  Has the patient contacted their pharmacy? Yes.   (Agent: If no, request that the patient contact the pharmacy for the refill.) (Agent: If yes, when and what did the pharmacy advise?)  Preferred Pharmacy (with phone number or street name): [facility], [address] [place] [place] [address] Phone: [phone] Fax: [phone]  Agent: Please be advised that RX refills may take up to 3 business days. We ask that you follow-up with your pharmacy.

## 2018-10-24 NOTE — Telephone Encounter (Signed)
Requested medication (s) are due for refill today: yes  Requested medication (s) are on the active medication list: yes  Last refill:  Last refilled by historical provider  Future visit scheduled: No  Notes to clinic:  Medication previously prescribed by historical provider. Pt requesting PCP to fill medication because she will not be able to see the endocrinologist until 11/07/18    Requested Prescriptions  Pending Prescriptions Disp Refills   insulin aspart (NOVOLOG) 100 UNIT/ML injection 10 mL     Sig: Inject into the skin.     Endocrinology:  Diabetes - Insulins Failed - 10/24/2018  4:54 PM      Failed - HBA1C is between 0 and 7.9 and within 180 days    Hemoglobin A1C  Date Value Ref Range Status  02/02/2017 7.1  Final         Passed - Valid encounter within last 6 months    Recent Outpatient Visits          3 weeks ago Furuncle of left axilla   [facility], MD   2 months ago Acute bronchitis, unspecified organism   [facility], Alinda Sierras, MD   4 months ago Candidiasis of breast   [facility] [place], MD   4 months ago Acute dysfunction of left eustachian tube   [facility], Alinda Sierras, MD   6 months ago Acute recurrent sinusitis, unspecified location   [facility], Ishmael Holter, MD

## 2018-10-25 NOTE — Telephone Encounter (Signed)
Her dosage is not in the chart. Please ask her how much she is taking and when

## 2018-10-25 NOTE — Telephone Encounter (Signed)
Dr. Sarajane Jews please advise on refill of insulin  Thanks

## 2018-10-28 ENCOUNTER — Ambulatory Visit (INDEPENDENT_AMBULATORY_CARE_PROVIDER_SITE_OTHER): Payer: PRIVATE HEALTH INSURANCE | Admitting: Psychology

## 2018-10-28 DIAGNOSIS — F4481 Dissociative identity disorder: Secondary | ICD-10-CM | POA: Diagnosis not present

## 2018-10-28 DIAGNOSIS — F4312 Post-traumatic stress disorder, chronic: Secondary | ICD-10-CM

## 2018-10-29 ENCOUNTER — Ambulatory Visit: Payer: PRIVATE HEALTH INSURANCE | Admitting: Family Medicine

## 2018-10-31 ENCOUNTER — Encounter: Payer: Self-pay | Admitting: Family Medicine

## 2018-10-31 ENCOUNTER — Ambulatory Visit (INDEPENDENT_AMBULATORY_CARE_PROVIDER_SITE_OTHER): Payer: PRIVATE HEALTH INSURANCE | Admitting: Family Medicine

## 2018-10-31 ENCOUNTER — Ambulatory Visit (INDEPENDENT_AMBULATORY_CARE_PROVIDER_SITE_OTHER): Payer: PRIVATE HEALTH INSURANCE | Admitting: Psychology

## 2018-10-31 VITALS — BP 122/79 | HR 75 | Ht 62.0 in | Wt 210.0 lb

## 2018-10-31 DIAGNOSIS — M549 Dorsalgia, unspecified: Secondary | ICD-10-CM | POA: Diagnosis not present

## 2018-10-31 DIAGNOSIS — F4312 Post-traumatic stress disorder, chronic: Secondary | ICD-10-CM | POA: Diagnosis not present

## 2018-10-31 DIAGNOSIS — F4481 Dissociative identity disorder: Secondary | ICD-10-CM | POA: Diagnosis not present

## 2018-10-31 DIAGNOSIS — G8929 Other chronic pain: Secondary | ICD-10-CM

## 2018-10-31 DIAGNOSIS — M25562 Pain in left knee: Secondary | ICD-10-CM

## 2018-10-31 NOTE — Telephone Encounter (Signed)
Dr. Sarajane Jews please advise if the insulin could be called in for 1 bottle to last her until her appt with endo on 11/12/2018?  thanks

## 2018-10-31 NOTE — Telephone Encounter (Signed)
Patient has endo appointment on 02/04. Patient would like to know if she can get this medication refilled until then. Please advise.

## 2018-10-31 NOTE — Telephone Encounter (Signed)
No these refills need to come from her Endocrinologist

## 2018-10-31 NOTE — Patient Instructions (Addendum)
You have strained the deep posture muscles of your back). Heat 15 minutes at a time 3-4 times a day. Aspercreme or biofreeze up to 4 times a day topically. Continue your home exercises and stretches. Let me know if you need more of the tizanidine. Gabapentin three times a day - you should have 5 more refills of this at your pharmacy. Consider massage, acupuncture. The hot tub and pool at the [facility] would be beneficial including water aerobics.  You have patellar tendinitis and underlying knee arthritis. Ice area 15 minutes at a time 3-4 times a day as needed. Straight leg raises, knee extensions, decline squats 3 sets of 10 once a day. Patellar tendon strap when up and walking around. Consider physical therapy. Consider nitro patches if not improving.   Follow up with me in 1 month.

## 2018-10-31 NOTE — Telephone Encounter (Signed)
Pt states that she was able to get appt with endo moved up to 11/12/2018 with different provider. She doesn't think she will have enough insulin and is hoping 1 bottle can be sent in for her if possible. Please let her know.

## 2018-10-31 NOTE — Telephone Encounter (Signed)
Called and spoke with pt and she stated that she uses the 10 ml bottle of insulin in her pump.  She uses 40.5 units per day.    12am to 3 am she uses 1.5 units per hour 3 am to 6 am she uses 3 units per hour 6 am to 3 am she uses 1.5 units per hour.    She is also requesting a refill on her levothyroxine.  This is also filled by the endocrinologist and she is almost out.   She will not be able to see the endocrinologist until 12/03/2018.    Dr. Sarajane Jews please advise thanks

## 2018-11-01 ENCOUNTER — Encounter: Payer: Self-pay | Admitting: Family Medicine

## 2018-11-01 MED ORDER — INSULIN ASPART 100 UNIT/ML ~~LOC~~ SOLN: SUBCUTANEOUS | 0 refills | Status: AC

## 2018-11-01 NOTE — Progress Notes (Signed)
PCP: Laurey Morale, MD  Subjective:   HPI: Patient is a 43 y.o. female here for upper back pain, left knee pain  12/16: Patient denies known injury or trauma. She states on Saturday during the day she developed a stabbing pain between her scapulae more on the left side. Has tried heat/ice and robaxin without much benefit. Pain level 7/10, sharp. Constant dull ache in this area though. Worse with reaching. No numbness, skin changes.  10/31/18: Patient reports her pain in the upper back has spread from upper back down low back. Pain level 6/10, a tightness. Has been using topical medication, icing, tizanidine twice a day. Taking gabapentin three times a day. Her left knee has been hurting past 2 weeks - locking at times. Worsened after twisting this in the bed. No skin changes, numbness.  Past Medical History:  Diagnosis Date  . Anxiety   . Arthritis   . Asthma   . Bowel obstruction ([place])   . Cardiac arrhythmia   . Depression    sees Dr. Matilde Haymaker in [place]   . Diabetes mellitus    type 2   . GERD (gastroesophageal reflux disease)   . Hyperlipidemia   . Hypertension   . Hypothyroidism    sees Dr. Elyse Hsu  . Insomnia   . Migraine syndrome   . Morbid obesity ([place])   . 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 Neck pain   . Neuropathy associated with endocrine disorder ([place])   . Sleep apnea with use of continuous positive airway pressure (CPAP)    Sleep apnea is resolved due to weight loss. 08/2017  . 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 Stroke [facility]) 04/2016    Current Outpatient Medications on File Prior to Visit  Medication Sig Dispense Refill  . albuterol (PROAIR HFA) 108 (90 Base) MCG/ACT inhaler Inhale 2 puffs into the lungs every 4 (four) hours as needed for wheezing. 8.5 g 11  . ARIPiprazole (ABILIFY) 30 MG tablet Take 30 mg by mouth daily.    . budesonide-formoterol (SYMBICORT) 160-4.5 MCG/ACT inhaler Inhale 2 puffs into the lungs 2 (two) times daily. 1  Inhaler 11  . buPROPion (WELLBUTRIN XL) 300 MG 24 hr tablet Take 1 tablet (300 mg total) by mouth daily. 30 tablet 0  . clopidogrel (PLAVIX) 75 MG tablet TAKE 1 TABLET(75 MG) BY MOUTH DAILY 90 tablet 0  . gabapentin (NEURONTIN) 100 MG capsule TAKE 1 CAPSULE(100 MG) BY MOUTH THREE TIMES DAILY 90 capsule 5  . hydrOXYzine (ATARAX/VISTARIL) 50 MG tablet Take 50 mg by mouth. Take 1 tablet every night and may take 3 other times as needed    . insulin aspart (NOVOLOG) 100 UNIT/ML injection Use in the insulin pump as directed 10 mL 0  . levothyroxine (SYNTHROID, LEVOTHROID) 125 MCG tablet Take 1 tablet (125 mcg total) by mouth daily before breakfast. (Patient not taking: Reported on 10/01/2018) 90 tablet 3  . metoprolol succinate (TOPROL-XL) 25 MG 24 hr tablet Take 0.5 tablets (12.5 mg total) by mouth daily. 30 tablet 0  . Multiple Vitamin (MULTIVITAMIN) tablet Take 1 tablet by mouth daily.    . norethindrone (MICRONOR,CAMILA,ERRIN) 0.35 MG tablet     . nystatin (NYSTATIN) powder Apply topically 4 (four) times daily. 60 g 5  . oxyCODONE-acetaminophen (PERCOCET/ROXICET) 5-325 MG tablet Take 1 tablet by mouth every 6 (six) hours as needed for severe pain. (Patient not taking: Reported on 10/01/2018) 20 tablet 0  . pioglitazone (ACTOS) 45 MG tablet Take  1 tablet (45 mg total) by mouth daily. (Patient not taking: Reported on 10/01/2018) 30 tablet 0  . prazosin (MINIPRESS) 5 MG capsule Take 10 mg by mouth at bedtime.    . promethazine (PHENERGAN) 25 MG tablet Take 1 tablet (25 mg total) every 6 (six) hours as needed by mouth for nausea or vomiting. 10 tablet 0  . rizatriptan (MAXALT) 10 MG tablet Take 1 tablet (10 mg total) by mouth as needed for migraine. May repeat in 2 hours if needed 30 tablet 5  . rosuvastatin (CRESTOR) 20 MG tablet TAKE 1 TABLET(20 MG) BY MOUTH DAILY 30 tablet 0  . topiramate (TOPAMAX) 100 MG tablet Take 100 mg by mouth 2 (two) times daily.     . traZODone (DESYREL) 150 MG tablet Take by  mouth at bedtime.    . valACYclovir (VALTREX) 1000 MG tablet Take 1 tablet (1,000 mg total) by mouth 3 (three) times daily. (Patient taking differently: Take 1,000 mg by mouth 3 (three) times daily as needed. ) 21 tablet 5   No current facility-administered medications on file prior to visit.     Past Surgical History:  Procedure Laterality Date  . blocked itestinal repair  90   age 52 months  . BREAST BIOPSY Left 2017  . CHOLECYSTECTOMY  2011  . INDUCED ABORTION  1996   forced abortion  . INGUINAL HERNIA REPAIR Left 1980   age 69  . LAPAROSCOPIC ENDOMETRIOSIS FULGURATION  1998  . WISDOM TOOTH EXTRACTION  2007    Allergies  Allergen Reactions  . Macrobid [Nitrofurantoin] Hives  . Byetta 10 Mcg Pen [Exenatide] Hives  . Clindamycin/Lincomycin Nausea And Vomiting  . Geodon [Ziprasidone Hcl]   . Lipitor [Atorvastatin]     Per patient this causes cramps  . Nsaids     Upset stomach   . Penicillins Other (See Comments)    Has patient had a PCN reaction causing immediate rash, facial/tongue/throat swelling, SOB or lightheadedness with hypotension: No Has patient had a PCN reaction causing severe rash involving mucus membranes or skin necrosis: No Has patient had a PCN reaction that required hospitalization No Has patient had a PCN reaction occurring within the last 10 years: No If all of the above answers are "NO", then may proceed with Cephalosporin use.   Marland Kitchen Avocado Diarrhea and Other (See Comments)    Severe cramping, sweats, and diarrhea in upper GI/stomach  . Tramadol Hcl Itching and Rash    Social History   Socioeconomic History  . Marital status: Divorced    Spouse name: Not on file  . Number of children: 5  . Years of education: Not on file  . Highest education level: Not on file  Occupational History  . Occupation: Nurse, learning disability  Social Needs  . Financial resource strain: Not on file  . Food insecurity:    Worry: Not on file    Inability: Not  on file  . Transportation needs:    Medical: Not on file    Non-medical: Not on file  Tobacco Use  . Smoking status: Former Smoker    Packs/day: 0.50    Years: 0.50    Pack years: 0.25    Types: Cigarettes    Start date: 02/24/2018  . Smokeless tobacco: Never Used  . Tobacco comment: quit 03/25/2016  Substance and Sexual Activity  . Alcohol use: No    Alcohol/week: 0.0 standard drinks  . Drug use: Yes    Types: Marijuana    Comment: 2/10  last marijuana use  . Sexual activity: Not on file  Lifestyle  . Physical activity:    Days per week: Not on file    Minutes per session: Not on file  . Stress: Not on file  Relationships  . Social connections:    Talks on phone: Not on file    Gets together: Not on file    Attends religious service: Not on file    Active member of club or organization: Not on file    Attends meetings of clubs or organizations: Not on file    Relationship status: Not on file  . Intimate partner violence:    Fear of current or ex partner: Not on file    Emotionally abused: Not on file    Physically abused: Not on file    Forced sexual activity: Not on file  Other Topics Concern  . Not on file  Social History Narrative  . Not on file    Family History  Adopted: Yes  Problem Relation Age of Onset  . Heart disease Unknown        on both sides of family  . Diabetes Unknown        on both sides of family    BP 122/79   Pulse 75   Ht 5\' 2"  (1.575 m)   Wt 210 lb (95.3 kg)   BMI 38.41 kg/m   Review of Systems: See HPI above.     Objective:  Physical Exam:  Gen: NAD, comfortable in exam room  Left knee: No gross deformity, ecchymoses, swelling. TTP medial and lateral joint lines, patellar tendon. FROM with 5/5 strength flexion and extension. Negative ant/post drawers. Negative valgus/varus testing. Negative lachmans. Negative mcmurrays, apleys, patellar apprehension. NV intact distally.  Upper back: No deformity. FROM with 5/5  strength upper and lower extremities. TTP paraspinal regions from cervical to low lumbar regions.  No midline tenderness. NVI distally.   Assessment & Plan:  1. Back pain - consistent with strain of deep posture muscles of back.  Heat, topical medications, gabapentin, tizanidine.  Consider massage, acupuncture.  Consider hot tub and pool at [facility].  2. Left knee pain - 2/2 patellar tendinitis and underlying arthritis.  Shown home exercises to do daily.  Icing, patellar tendon strap.  Consider physical therapy, nitro patches.  F/u in 1 month.

## 2018-11-01 NOTE — Telephone Encounter (Signed)
Call in one bottle only

## 2018-11-04 ENCOUNTER — Ambulatory Visit (INDEPENDENT_AMBULATORY_CARE_PROVIDER_SITE_OTHER): Payer: PRIVATE HEALTH INSURANCE | Admitting: Psychology

## 2018-11-04 DIAGNOSIS — F4312 Post-traumatic stress disorder, chronic: Secondary | ICD-10-CM | POA: Diagnosis not present

## 2018-11-04 DIAGNOSIS — F4481 Dissociative identity disorder: Secondary | ICD-10-CM

## 2018-11-07 ENCOUNTER — Ambulatory Visit (INDEPENDENT_AMBULATORY_CARE_PROVIDER_SITE_OTHER): Payer: PRIVATE HEALTH INSURANCE | Admitting: Psychology

## 2018-11-07 DIAGNOSIS — F4312 Post-traumatic stress disorder, chronic: Secondary | ICD-10-CM

## 2018-11-07 DIAGNOSIS — F4481 Dissociative identity disorder: Secondary | ICD-10-CM | POA: Diagnosis not present

## 2018-11-11 ENCOUNTER — Ambulatory Visit (INDEPENDENT_AMBULATORY_CARE_PROVIDER_SITE_OTHER): Payer: PRIVATE HEALTH INSURANCE | Admitting: Psychology

## 2018-11-11 DIAGNOSIS — F4312 Post-traumatic stress disorder, chronic: Secondary | ICD-10-CM | POA: Diagnosis not present

## 2018-11-11 DIAGNOSIS — F4481 Dissociative identity disorder: Secondary | ICD-10-CM | POA: Diagnosis not present

## 2018-11-14 ENCOUNTER — Ambulatory Visit (INDEPENDENT_AMBULATORY_CARE_PROVIDER_SITE_OTHER): Payer: PRIVATE HEALTH INSURANCE | Admitting: Psychology

## 2018-11-14 DIAGNOSIS — F4312 Post-traumatic stress disorder, chronic: Secondary | ICD-10-CM | POA: Diagnosis not present

## 2018-11-14 DIAGNOSIS — F4481 Dissociative identity disorder: Secondary | ICD-10-CM

## 2018-11-14 MED ORDER — TIZANIDINE HCL 2 MG PO TABS: 2.0000 mg | ORAL_TABLET | Freq: Three times a day (TID) | ORAL | 1 refills | Status: DC | PRN

## 2018-11-25 ENCOUNTER — Ambulatory Visit (INDEPENDENT_AMBULATORY_CARE_PROVIDER_SITE_OTHER): Payer: PRIVATE HEALTH INSURANCE | Admitting: Psychology

## 2018-11-25 DIAGNOSIS — F4481 Dissociative identity disorder: Secondary | ICD-10-CM

## 2018-11-25 DIAGNOSIS — F4312 Post-traumatic stress disorder, chronic: Secondary | ICD-10-CM

## 2018-11-28 ENCOUNTER — Ambulatory Visit: Payer: PRIVATE HEALTH INSURANCE | Admitting: Psychology

## 2018-12-02 ENCOUNTER — Ambulatory Visit: Payer: PRIVATE HEALTH INSURANCE | Admitting: Psychology

## 2018-12-02 ENCOUNTER — Encounter: Payer: Self-pay | Admitting: Family Medicine

## 2018-12-02 ENCOUNTER — Ambulatory Visit (INDEPENDENT_AMBULATORY_CARE_PROVIDER_SITE_OTHER): Payer: BLUE CROSS/BLUE SHIELD | Admitting: Family Medicine

## 2018-12-02 VITALS — BP 111/74 | Ht 62.0 in | Wt 218.0 lb

## 2018-12-02 DIAGNOSIS — M25562 Pain in left knee: Secondary | ICD-10-CM | POA: Diagnosis not present

## 2018-12-02 DIAGNOSIS — G8929 Other chronic pain: Secondary | ICD-10-CM | POA: Diagnosis not present

## 2018-12-02 MED ORDER — METHYLPREDNISOLONE ACETATE 40 MG/ML IJ SUSP
40.0000 mg | Freq: Once | INTRAMUSCULAR | Status: AC
  Administered 2018-12-02: 40 mg via INTRA_ARTICULAR

## 2018-12-02 NOTE — Progress Notes (Signed)
PCP: Laurey Morale, MD  Subjective:   HPI: Patient is a 43 y.o. female here for left knee pain  12/16: Patient denies known injury or trauma. She states on Saturday during the day she developed a stabbing pain between her scapulae more on the left side. Has tried heat/ice and robaxin without much benefit. Pain level 7/10, sharp. Constant dull ache in this area though. Worse with reaching. No numbness, skin changes.  10/31/18: Patient reports her pain in the upper back has spread from upper back down low back. Pain level 6/10, a tightness. Has been using topical medication, icing, tizanidine twice a day. Taking gabapentin three times a day. Her left knee has been hurting past 2 weeks - locking at times. Worsened after twisting this in the bed. No skin changes, numbness.  2/3: Patient returns with persistent worsening left knee pain. Pain anterior and radiates posteriorly. Pain level 8-9/10 and constantly achy. Kind of catching especially when on stairs. Patellar strap helps some. Insurance just going through and should have soon. No skin changes. No new injuries.  Past Medical History:  Diagnosis Date  . Anxiety   . Arthritis   . Asthma   . Bowel obstruction ([place])   . Cardiac arrhythmia   . Depression    sees Dr. Matilde Haymaker in [place]   . Diabetes mellitus    type 2   . GERD (gastroesophageal reflux disease)   . Hyperlipidemia   . Hypertension   . Hypothyroidism    sees Dr. Elyse Hsu  . Insomnia   . Migraine syndrome   . Morbid obesity ([place])   . 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 Neck pain   . Neuropathy associated with endocrine disorder ([place])   . Sleep apnea with use of continuous positive airway pressure (CPAP)    Sleep apnea is resolved due to weight loss. 08/2017  . 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 Stroke [facility]) 04/2016    Current Outpatient Medications on File Prior to Visit  Medication Sig Dispense Refill  . albuterol (PROAIR HFA) 108 (90  Base) MCG/ACT inhaler Inhale 2 puffs into the lungs every 4 (four) hours as needed for wheezing. 8.5 g 11  . ARIPiprazole (ABILIFY) 30 MG tablet Take 30 mg by mouth daily.    . budesonide-formoterol (SYMBICORT) 160-4.5 MCG/ACT inhaler Inhale 2 puffs into the lungs 2 (two) times daily. 1 Inhaler 11  . buPROPion (WELLBUTRIN XL) 300 MG 24 hr tablet Take 1 tablet (300 mg total) by mouth daily. 30 tablet 0  . clopidogrel (PLAVIX) 75 MG tablet TAKE 1 TABLET(75 MG) BY MOUTH DAILY 90 tablet 0  . gabapentin (NEURONTIN) 100 MG capsule TAKE 1 CAPSULE(100 MG) BY MOUTH THREE TIMES DAILY 90 capsule 5  . hydrOXYzine (ATARAX/VISTARIL) 50 MG tablet Take 50 mg by mouth. Take 1 tablet every night and may take 3 other times as needed    . insulin aspart (NOVOLOG) 100 UNIT/ML injection Use in the insulin pump as directed 10 mL 0  . levothyroxine (SYNTHROID, LEVOTHROID) 125 MCG tablet Take 1 tablet (125 mcg total) by mouth daily before breakfast. (Patient not taking: Reported on 10/01/2018) 90 tablet 3  . metoprolol succinate (TOPROL-XL) 25 MG 24 hr tablet Take 0.5 tablets (12.5 mg total) by mouth daily. 30 tablet 0  . Multiple Vitamin (MULTIVITAMIN) tablet Take 1 tablet by mouth daily.    . norethindrone (MICRONOR,CAMILA,ERRIN) 0.35 MG tablet     . nystatin (NYSTATIN) powder Apply topically 4 (four)  times daily. 60 g 5  . oxyCODONE-acetaminophen (PERCOCET/ROXICET) 5-325 MG tablet Take 1 tablet by mouth every 6 (six) hours as needed for severe pain. (Patient not taking: Reported on 10/01/2018) 20 tablet 0  . pioglitazone (ACTOS) 45 MG tablet Take 1 tablet (45 mg total) by mouth daily. (Patient not taking: Reported on 10/01/2018) 30 tablet 0  . prazosin (MINIPRESS) 5 MG capsule Take 10 mg by mouth at bedtime.    . promethazine (PHENERGAN) 25 MG tablet Take 1 tablet (25 mg total) every 6 (six) hours as needed by mouth for nausea or vomiting. 10 tablet 0  . rizatriptan (MAXALT) 10 MG tablet Take 1 tablet (10 mg total) by mouth  as needed for migraine. May repeat in 2 hours if needed 30 tablet 5  . rosuvastatin (CRESTOR) 20 MG tablet TAKE 1 TABLET(20 MG) BY MOUTH DAILY 30 tablet 0  . tiZANidine (ZANAFLEX) 2 MG tablet Take 1 tablet (2 mg total) by mouth every 8 (eight) hours as needed for muscle spasms. 60 tablet 1  . topiramate (TOPAMAX) 100 MG tablet Take 100 mg by mouth 2 (two) times daily.     . traZODone (DESYREL) 150 MG tablet Take by mouth at bedtime.    . valACYclovir (VALTREX) 1000 MG tablet Take 1 tablet (1,000 mg total) by mouth 3 (three) times daily. (Patient taking differently: Take 1,000 mg by mouth 3 (three) times daily as needed. ) 21 tablet 5   No current facility-administered medications on file prior to visit.     Past Surgical History:  Procedure Laterality Date  . blocked itestinal repair  71   age 58 months  . BREAST BIOPSY Left 2017  . CHOLECYSTECTOMY  2011  . INDUCED ABORTION  1996   forced abortion  . INGUINAL HERNIA REPAIR Left 1980   age 49  . LAPAROSCOPIC ENDOMETRIOSIS FULGURATION  1998  . WISDOM TOOTH EXTRACTION  2007    Allergies  Allergen Reactions  . Macrobid [Nitrofurantoin] Hives  . Byetta 10 Mcg Pen [Exenatide] Hives  . Clindamycin/Lincomycin Nausea And Vomiting  . Geodon [Ziprasidone Hcl]   . Lipitor [Atorvastatin]     Per patient this causes cramps  . Nsaids     Upset stomach   . Penicillins Other (See Comments)    Has patient had a PCN reaction causing immediate rash, facial/tongue/throat swelling, SOB or lightheadedness with hypotension: No Has patient had a PCN reaction causing severe rash involving mucus membranes or skin necrosis: No Has patient had a PCN reaction that required hospitalization No Has patient had a PCN reaction occurring within the last 10 years: No If all of the above answers are "NO", then may proceed with Cephalosporin use.   Marland Kitchen Avocado Diarrhea and Other (See Comments)    Severe cramping, sweats, and diarrhea in upper GI/stomach  .  Tramadol Hcl Itching and Rash    Social History   Socioeconomic History  . Marital status: Divorced    Spouse name: Not on file  . Number of children: 5  . Years of education: Not on file  . Highest education level: Not on file  Occupational History  . Occupation: Nurse, learning disability  Social Needs  . Financial resource strain: Not on file  . Food insecurity:    Worry: Not on file    Inability: Not on file  . Transportation needs:    Medical: Not on file    Non-medical: Not on file  Tobacco Use  . Smoking status: Former Smoker  Packs/day: 0.50    Years: 0.50    Pack years: 0.25    Types: Cigarettes    Start date: 02/24/2018  . Smokeless tobacco: Never Used  . Tobacco comment: quit 03/25/2016  Substance and Sexual Activity  . Alcohol use: No    Alcohol/week: 0.0 standard drinks  . Drug use: Yes    Types: Marijuana    Comment: 2/10 last marijuana use  . Sexual activity: Not on file  Lifestyle  . Physical activity:    Days per week: Not on file    Minutes per session: Not on file  . Stress: Not on file  Relationships  . Social connections:    Talks on phone: Not on file    Gets together: Not on file    Attends religious service: Not on file    Active member of club or organization: Not on file    Attends meetings of clubs or organizations: Not on file    Relationship status: Not on file  . Intimate partner violence:    Fear of current or ex partner: Not on file    Emotionally abused: Not on file    Physically abused: Not on file    Forced sexual activity: Not on file  Other Topics Concern  . Not on file  Social History Narrative  . Not on file    Family History  Adopted: Yes  Problem Relation Age of Onset  . Heart disease Unknown        on both sides of family  . Diabetes Unknown        on both sides of family    BP 111/74   Ht 5\' 2"  (1.575 m)   Wt 218 lb (98.9 kg)   BMI 39.87 kg/m   Review of Systems: See HPI above.      Objective:  Physical Exam:  Gen: NAD, comfortable in exam room  Left knee: No gross deformity, ecchymoses, swelling. TTP medial and lateral joint lines, less patellar tendon. FROM with 5/5 strength. Negative ant/post drawers. Negative valgus/varus testing. Negative lachmans. Negative mcmurrays, apleys, patellar apprehension. NV intact distally.  Assessment & Plan:  1. Left knee pain - 2/2 patellar tendinitis and underlying arthritis.  Steroid injection given today.  Continue home exercises and patellar tendon strap.  Icing as needed.  F/u in 1 month.  Consider physical therapy.  After informed written consent timeout was performed, patient was seated on exam table. Left knee was prepped with alcohol swab and utilizing anteromedial approach, patient's left knee was injected intraarticularly with 3:1 bupivicaine: depomedrol. Patient tolerated the procedure well without immediate complications.

## 2018-12-02 NOTE — Patient Instructions (Signed)
You have a combination of patellar tendinitis and a flare of underlying arthritis. You were given an injection today. Continue home exercises, patellar tendon strap. Icing as needed. Consider physical therapy - let me know when you get your new cards and we can put in a referral. Follow up with me in 1 month.

## 2018-12-03 DIAGNOSIS — E118 Type 2 diabetes mellitus with unspecified complications: Secondary | ICD-10-CM | POA: Diagnosis not present

## 2018-12-03 DIAGNOSIS — E1165 Type 2 diabetes mellitus with hyperglycemia: Secondary | ICD-10-CM | POA: Diagnosis not present

## 2018-12-03 DIAGNOSIS — E785 Hyperlipidemia, unspecified: Secondary | ICD-10-CM | POA: Diagnosis not present

## 2018-12-03 DIAGNOSIS — E039 Hypothyroidism, unspecified: Secondary | ICD-10-CM | POA: Diagnosis not present

## 2018-12-05 ENCOUNTER — Ambulatory Visit: Payer: PRIVATE HEALTH INSURANCE | Admitting: Psychology

## 2018-12-09 ENCOUNTER — Ambulatory Visit: Payer: PRIVATE HEALTH INSURANCE | Admitting: Psychology

## 2018-12-11 ENCOUNTER — Other Ambulatory Visit: Payer: Self-pay | Admitting: Family Medicine

## 2018-12-11 NOTE — Telephone Encounter (Signed)
Requested medication (s) are due for refill today: yes  Requested medication (s) are on the active medication list: yes  Last refill:  10/01/18  Future visit scheduled: no  Notes to clinic:  Please include prescription signature    Requested Prescriptions  Pending Prescriptions Disp Refills   rosuvastatin (CRESTOR) 20 MG tablet 30 tablet 0     Cardiovascular:  Antilipid - Statins Failed - 12/11/2018  2:25 PM      Failed - Total Cholesterol in normal range and within 360 days    Cholesterol  Date Value Ref Range Status  10/26/2016 110 0 - 200 mg/dL Final         Failed - LDL in normal range and within 360 days    LDL Cholesterol  Date Value Ref Range Status  10/26/2016 45 0 - 99 mg/dL Final    Comment:           Total Cholesterol/HDL:CHD Risk Coronary Heart Disease Risk Table                     Men   Women  1/2 Average Risk   3.4   3.3  Average Risk       5.0   4.4  2 X Average Risk   9.6   7.1  3 X Average Risk  23.4   11.0        Use the calculated Patient Ratio above and the CHD Risk Table to determine the patient's CHD Risk.        ATP III CLASSIFICATION (LDL):  <100     mg/dL   Optimal  100-129  mg/dL   Near or Above                    Optimal  130-159  mg/dL   Borderline  160-189  mg/dL   High  >190     mg/dL   Very High Performed at [facility]          Failed - HDL in normal range and within 360 days    HDL  Date Value Ref Range Status  10/26/2016 47 >40 mg/dL Final         Failed - Triglycerides in normal range and within 360 days    Triglycerides  Date Value Ref Range Status  10/26/2016 91 <150 mg/dL Final         Passed - Patient is not pregnant      Passed - Valid encounter within last 12 months    Recent Outpatient Visits          2 months ago Furuncle of left axilla   [facility] at [facility], Ishmael Holter, MD   3 months ago Acute bronchitis, unspecified organism   [facility]rap[facility] at [facility], Alinda Sierras, MD   6 months ago Candidiasis of breast   Therapist, music at [place], MD   6 months ago Acute dysfunction of left eustachian tube   Therap[facility], [facility] at [facility], Alinda Sierras, MD   7 months ago Acute recurrent sinusitis, unspecified location   [employer] [facility], Ishmael Holter, MD

## 2018-12-11 NOTE — Telephone Encounter (Signed)
Copied from [place] [id number]. Topic: Quick Communication - Rx Refill/Question >> Dec 11, 2018  2:23 PM Bea Graff, NT wrote: Medication: rosuvastatin (CRESTOR) 20 MG tablet   Has the patient contacted their pharmacy? Yes.   (Agent: If no, request that the patient contact the pharmacy for the refill.) (Agent: If yes, when and what did the pharmacy advise?)  Preferred Pharmacy (with phone number or street name): [facility], [place]. [address] [phone] (Phone) [phone] (Fax)    Agent: Please be advised that RX refills may take up to 3 business days. We ask that you follow-up with your pharmacy.

## 2018-12-12 ENCOUNTER — Ambulatory Visit: Payer: PRIVATE HEALTH INSURANCE | Admitting: Psychology

## 2018-12-12 DIAGNOSIS — I493 Ventricular premature depolarization: Secondary | ICD-10-CM | POA: Diagnosis not present

## 2018-12-12 DIAGNOSIS — I639 Cerebral infarction, unspecified: Secondary | ICD-10-CM | POA: Diagnosis not present

## 2018-12-12 DIAGNOSIS — Z95818 Presence of other cardiac implants and grafts: Secondary | ICD-10-CM | POA: Diagnosis not present

## 2018-12-12 DIAGNOSIS — R0602 Shortness of breath: Secondary | ICD-10-CM | POA: Diagnosis not present

## 2018-12-12 DIAGNOSIS — E7849 Other hyperlipidemia: Secondary | ICD-10-CM | POA: Diagnosis not present

## 2018-12-12 MED ORDER — ROSUVASTATIN CALCIUM 20 MG PO TABS: ORAL_TABLET | ORAL | 0 refills | Status: DC

## 2018-12-13 ENCOUNTER — Ambulatory Visit (INDEPENDENT_AMBULATORY_CARE_PROVIDER_SITE_OTHER): Payer: Self-pay | Admitting: Family Medicine

## 2018-12-13 ENCOUNTER — Encounter: Payer: Self-pay | Admitting: Family Medicine

## 2018-12-13 VITALS — BP 128/70 | HR 76 | Temp 98.0°F | Wt 217.5 lb

## 2018-12-13 DIAGNOSIS — J069 Acute upper respiratory infection, unspecified: Secondary | ICD-10-CM

## 2018-12-13 NOTE — Progress Notes (Signed)
   Subjective:    Patient ID: Miranda [name], female    DOB: June 16, 1976, 43 y.o.   MRN: [id number]  HPI Here for 4 days of stuffy head, PND, and a dry cough. She had a fever the first day but not now.    Review of Systems  Constitutional: Negative.   HENT: Positive for congestion, postnasal drip, sinus pressure and sore throat. Negative for sinus pain.   Eyes: Negative.   Respiratory: Positive for cough.        Objective:   Physical Exam Constitutional:      Appearance: Normal appearance.  HENT:     Right Ear: Tympanic membrane and ear canal normal.     Left Ear: Tympanic membrane and ear canal normal.     Nose: Nose normal.     Mouth/Throat:     Pharynx: Oropharynx is clear.  Eyes:     Conjunctiva/sclera: Conjunctivae normal.  Pulmonary:     Effort: Pulmonary effort is normal.     Breath sounds: Normal breath sounds.  Lymphadenopathy:     Cervical: No cervical adenopathy.  Neurological:     Mental Status: She is alert.           Assessment & Plan:  Viral URI. Drink fluids and use Delsym prn.  Alysia Penna, MD

## 2018-12-16 ENCOUNTER — Ambulatory Visit: Payer: PRIVATE HEALTH INSURANCE | Admitting: Psychology

## 2018-12-18 ENCOUNTER — Ambulatory Visit (INDEPENDENT_AMBULATORY_CARE_PROVIDER_SITE_OTHER): Payer: Self-pay | Admitting: Psychology

## 2018-12-18 DIAGNOSIS — F4312 Post-traumatic stress disorder, chronic: Secondary | ICD-10-CM

## 2018-12-18 DIAGNOSIS — F331 Major depressive disorder, recurrent, moderate: Secondary | ICD-10-CM

## 2018-12-18 DIAGNOSIS — F4481 Dissociative identity disorder: Secondary | ICD-10-CM

## 2018-12-19 ENCOUNTER — Ambulatory Visit: Payer: PRIVATE HEALTH INSURANCE | Admitting: Psychology

## 2018-12-20 ENCOUNTER — Ambulatory Visit (INDEPENDENT_AMBULATORY_CARE_PROVIDER_SITE_OTHER): Payer: Self-pay | Admitting: Adult Health

## 2018-12-20 ENCOUNTER — Ambulatory Visit: Payer: Self-pay | Admitting: *Deleted

## 2018-12-20 ENCOUNTER — Encounter: Payer: Self-pay | Admitting: Adult Health

## 2018-12-20 VITALS — BP 120/86 | Temp 98.3°F | Wt 220.0 lb

## 2018-12-20 DIAGNOSIS — K5900 Constipation, unspecified: Secondary | ICD-10-CM | POA: Diagnosis not present

## 2018-12-20 DIAGNOSIS — R110 Nausea: Secondary | ICD-10-CM | POA: Diagnosis not present

## 2018-12-20 DIAGNOSIS — R1084 Generalized abdominal pain: Secondary | ICD-10-CM | POA: Diagnosis not present

## 2018-12-20 DIAGNOSIS — R197 Diarrhea, unspecified: Secondary | ICD-10-CM | POA: Diagnosis not present

## 2018-12-20 DIAGNOSIS — Z3202 Encounter for pregnancy test, result negative: Secondary | ICD-10-CM | POA: Diagnosis not present

## 2018-12-20 NOTE — Telephone Encounter (Addendum)
Pt calling with complaints of right upper quadrant abdominal pain since starting Trulicity. Pt unable to verbalize exact date Trulicity was started but states that this would be the 3rd Tuesday she would have taken the medication. Pt states she did not take the  medication this Tuesday and the endocrinologist is aware and has since stopped the medication. Pt states the pain ranges between 5-7 and the area is tender to touch and states that it is constant. Pt states she did have a BM today. No other symptoms voiced at this time. Pt scheduled for appt today with Cory,NP and advised to return call to office with worsening symptoms. Understanding verbalized.  Reason for Disposition . [1] MILD-MODERATE pain AND [2] constant AND [3] present > 2 hours  Answer Assessment - Initial Assessment Questions 1. LOCATION: "Where does it hurt?"      Right upper quadrant 2. RADIATION: "Does the pain shoot anywhere else?" (e.g., chest, back)     Ever present pain with moving, standing up 3. ONSET: "When did the pain begin?" (e.g., minutes, hours or days ago)      Worsened over the past couple of days 4. SUDDEN: "Gradual or sudden onset?"     gradual 5. PATTERN "Does the pain come and go, or is it constant?"    - If constant: "Is it getting better, staying the same, or worsening?"      (Note: Constant means the pain never goes away completely; most serious pain is constant and it progresses)     - If intermittent: "How long does it last?" "Do you have pain now?"     (Note: Intermittent means the pain goes away completely between bouts)     constant 6. SEVERITY: "How bad is the pain?"  (e.g., Scale 1-10; mild, moderate, or severe)    - MILD (1-3): doesn't interfere with normal activities, abdomen soft and not tender to touch     - MODERATE (4-7): interferes with normal activities or awakens from sleep, tender to touch     - SEVERE (8-10): excruciating pain, doubled over, unable to do any normal activities    Ranges between 5-7, moderate 7. RECURRENT SYMPTOM: "Have you ever had this type of abdominal pain before?" If so, ask: "When was the last time?" and "What happened that time?"      No 8. AGGRAVATING FACTORS: "Does anything seem to cause this pain?" (e.g., foods, stress, alcohol)     Sometimes eating but is unsure of what particular food. Pt states that she had noodle and after eating them a week ago noodles made pain really bad. Pt states she does not have a gallbladder anymore 9. CARDIAC SYMPTOMS: "Do you have any of the following symptoms: chest pain, difficulty breathing, sweating, nausea?"     No 10. OTHER SYMPTOMS: "Do you have any other symptoms?" (e.g., fever, vomiting, diarrhea)       Some nausea with BMs but that is usual and a cold and congestion for two weeks 11. PREGNANCY: "Is there any chance you are pregnant?" "When was your last menstrual period?"       No, LMP- about 2 weeks ago  Protocols used: ABDOMINAL PAIN - UPPER-A-AH

## 2018-12-20 NOTE — Progress Notes (Signed)
Subjective:    Patient ID: Miranda [name], female    DOB: [birth year], 43 y.o.   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  He presents to the office today with an acute complaint of neurolysed upper abdominal pain that is worse in the right upper quadrant.  She has had some abdominal pain since starting Trulicity about 2 weeks ago.  She contacted her endocrinologist who advised her to stop the medication which she did she did not take her third dose which would have been 3 days ago.  She continues to have abdominal pain that is constant bribed as a "sharp dull pain".  Pain does radiate throughout the abdomen to her back.  Have pain with taking deep breath and has noticed that pain is worse after eating.  Does endorse increased burping foul odor, nausea, and pale color stools with mucus.  Have a bowel movement today which was normal and did not get any relief.  The only thing that she has found that helps with her pain is using a heating pad.  She denies fevers, chills, or vomiting  History of gallbladder surgery    Review of Systems See HPI   Past Medical History:  Diagnosis Date  . Anxiety   . Arthritis   . Asthma   . Bowel obstruction ([place])   . Cardiac arrhythmia   . Depression    sees Dr. Matilde Haymaker in [place]   . Diabetes mellitus    type 2   . GERD (gastroesophageal reflux disease)   . Hyperlipidemia   . Hypertension   . Hypothyroidism    sees Dr. Elyse Hsu  . Insomnia   . Migraine  syndrome   . Morbid obesity ([place])   . 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 Neck pain   . Neuropathy associated with endocrine disorder ([place])   . Sleep apnea with use of continuous positive airway pressure (CPAP)    Sleep apnea is resolved due to weight loss. 08/2017  . 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 Stroke [facility]) 04/2016    Social History   Socioeconomic History  . Marital status: Divorced    Spouse name: Not on file  . Number of children: 5  . Years of education: Not on file  . Highest education level: Not on file  Occupational History  . Occupation: Nurse, learning disability  Social Needs  . Financial resource strain: Not on file  . Food insecurity:    Worry: Not on file    Inability: Not on file  . Transportation needs:    Medical: Not on file    Non-medical: Not on file  Tobacco Use  . Smoking status: Former Smoker    Packs/day: 0.50    Years: 0.50    Pack years: 0.25    Types: Cigarettes    Start date: 02/24/2018  . Smokeless tobacco: Never Used  . Tobacco comment: quit 03/25/2016  Substance and Sexual Activity  . Alcohol use: No    Alcohol/week: 0.0 standard drinks  . Drug use: Yes    Types: Marijuana    Comment: 2/10 last  marijuana use  . Sexual activity: Not on file  Lifestyle  . Physical activity:    Days per week: Not on file    Minutes per session: Not on file  . Stress: Not on file  Relationships  . Social connections:    Talks on phone: Not on file    Gets together: Not on file    Attends religious service: Not on file    Active member of club or organization: Not on file    Attends meetings of clubs or organizations: Not on file    Relationship status: Not on file  . Intimate partner violence:    Fear of current or ex partner: Not on file    Emotionally abused: Not on file    Physically abused: Not on file    Forced sexual activity: Not on file  Other Topics Concern  . Not on file  Social History Narrative  . Not on file    Past  Surgical History:  Procedure Laterality Date  . blocked itestinal repair  70   age 55 months  . BREAST BIOPSY Left 2017  . CHOLECYSTECTOMY  2011  . INDUCED ABORTION  1996   forced abortion  . INGUINAL HERNIA REPAIR Left 1980   age 89  . LAPAROSCOPIC ENDOMETRIOSIS FULGURATION  1998  . WISDOM TOOTH EXTRACTION  2007    Family History  Adopted: Yes  Problem Relation Age of Onset  . Heart disease Unknown        on both sides of family  . Diabetes Unknown        on both sides of family    Allergies  Allergen Reactions  . Macrobid [Nitrofurantoin] Hives  . Metformin And Related Other (See Comments)    Abdominal cramping   . Byetta 10 Mcg Pen [Exenatide] Hives  . Clindamycin/Lincomycin Nausea And Vomiting  . Geodon [Ziprasidone Hcl]   . Lipitor [Atorvastatin]     Per patient this causes cramps  . Nsaids     Upset stomach   . Penicillins Other (See Comments)    Has patient had a PCN reaction causing immediate rash, facial/tongue/throat swelling, SOB or lightheadedness with hypotension: No Has patient had a PCN reaction causing severe rash involving mucus membranes or skin necrosis: No Has patient had a PCN reaction that required hospitalization No Has patient had a PCN reaction occurring within the last 10 years: No If all of the above answers are "NO", then may proceed with Cephalosporin use.   Marland Kitchen Avocado Diarrhea and Other (See Comments)    Severe cramping, sweats, and diarrhea in upper GI/stomach  . Tramadol Hcl Itching and Rash    Current Outpatient Medications on File Prior to Visit  Medication Sig Dispense Refill  . albuterol (PROAIR HFA) 108 (90 Base) MCG/ACT inhaler Inhale 2 puffs into the lungs every 4 (four) hours as needed for wheezing. 8.5 g 11  . ARIPiprazole (ABILIFY) 30 MG tablet Take 30 mg by mouth daily.    . budesonide-formoterol (SYMBICORT) 160-4.5 MCG/ACT inhaler Inhale 2 puffs into the lungs 2 (two) times daily. 1 Inhaler 11  . buPROPion (WELLBUTRIN SR)  150 MG 12 hr tablet Take 2 tablets in the morning and 1 tablet in the evening    . clopidogrel (PLAVIX) 75 MG tablet TAKE 1 TABLET(75 MG) BY MOUTH DAILY 90 tablet 0  . gabapentin (NEURONTIN) 100 MG capsule TAKE 1 CAPSULE(100 MG) BY MOUTH THREE TIMES DAILY 90 capsule 5  . hydrOXYzine (ATARAX/VISTARIL) 50 MG tablet  Take 50 mg by mouth. Take 1 tablet every night and may take 3 other times as needed    . insulin aspart (NOVOLOG) 100 UNIT/ML injection Use in the insulin pump as directed 10 mL 0  . levothyroxine (SYNTHROID, LEVOTHROID) 125 MCG tablet Take 1 tablet (125 mcg total) by mouth daily before breakfast. 90 tablet 3  . metoprolol succinate (TOPROL-XL) 25 MG 24 hr tablet Take 0.5 tablets (12.5 mg total) by mouth daily. 30 tablet 0  . Multiple Vitamin (MULTIVITAMIN) tablet Take 1 tablet by mouth daily.    . norethindrone (MICRONOR,CAMILA,ERRIN) 0.35 MG tablet     . nystatin (NYSTATIN) powder Apply topically 4 (four) times daily. 60 g 5  . prazosin (MINIPRESS) 5 MG capsule Take 10 mg by mouth at bedtime.    . promethazine (PHENERGAN) 25 MG tablet Take 1 tablet (25 mg total) every 6 (six) hours as needed by mouth for nausea or vomiting. 10 tablet 0  . rizatriptan (MAXALT) 10 MG tablet Take 1 tablet (10 mg total) by mouth as needed for migraine. May repeat in 2 hours if needed 30 tablet 5  . rosuvastatin (CRESTOR) 20 MG tablet Take one tablet by mouth daily 30 tablet 0  . tiZANidine (ZANAFLEX) 2 MG tablet Take 1 tablet (2 mg total) by mouth every 8 (eight) hours as needed for muscle spasms. 60 tablet 1  . topiramate (TOPAMAX) 100 MG tablet Take 100 mg by mouth 2 (two) times daily.     . traZODone (DESYREL) 150 MG tablet Take by mouth at bedtime.    . valACYclovir (VALTREX) 1000 MG tablet Take 1 tablet (1,000 mg total) by mouth 3 (three) times daily. (Patient taking differently: Take 1,000 mg by mouth 3 (three) times daily as needed. ) 21 tablet 5  . Dulaglutide (TRULICITY) 4.78 GN/5.6OZ SOPN Inject  into the skin. 1 injection once weekly     No current facility-administered medications on file prior to visit.     BP 120/86   Temp 98.3 F (36.8 C)   Wt 220 lb (99.8 kg)   BMI 40.24 kg/m       Objective:   Physical Exam Vitals signs and nursing note reviewed.  Constitutional:      Appearance: She is well-developed. She is ill-appearing.  Cardiovascular:     Rate and Rhythm: Normal rate and regular rhythm.     Pulses: Normal pulses.     Heart sounds: Normal heart sounds.  Pulmonary:     Breath sounds: Normal breath sounds.  Abdominal:     General: Bowel sounds are normal. There is no distension.     Palpations: Abdomen is soft.     Tenderness: There is generalized abdominal tenderness and tenderness in the right upper quadrant, right lower quadrant, epigastric area, periumbilical area, left upper quadrant and left lower quadrant. There is rebound. There is no guarding. Negative signs include Murphy's sign and McBurney's sign.  Neurological:     Mental Status: She is alert.       Assessment & Plan:  1. Generalized abdominal pain - Strong suspicion for pancreatitis from trulicity. Will need labs and CT scan. Possible admission. Discussed findings with patient and her roommate. They are agreeable to go to the ER.   Dorothyann Peng, NP

## 2018-12-20 NOTE — Telephone Encounter (Signed)
Noted  

## 2018-12-23 ENCOUNTER — Ambulatory Visit: Payer: PRIVATE HEALTH INSURANCE | Admitting: Psychology

## 2018-12-24 ENCOUNTER — Ambulatory Visit (INDEPENDENT_AMBULATORY_CARE_PROVIDER_SITE_OTHER): Payer: Self-pay | Admitting: Family Medicine

## 2018-12-24 ENCOUNTER — Encounter: Payer: Self-pay | Admitting: Family Medicine

## 2018-12-24 VITALS — BP 106/70 | HR 82 | Temp 99.4°F | Wt 221.2 lb

## 2018-12-24 DIAGNOSIS — R1010 Upper abdominal pain, unspecified: Secondary | ICD-10-CM

## 2018-12-24 MED ORDER — PANTOPRAZOLE SODIUM 40 MG PO TBEC: 40.0000 mg | DELAYED_RELEASE_TABLET | Freq: Every day | ORAL | 3 refills | Status: DC

## 2018-12-25 ENCOUNTER — Encounter: Payer: Self-pay | Admitting: Family Medicine

## 2018-12-25 NOTE — Progress Notes (Signed)
   Subjective:    Patient ID: Miranda [name], female    DOB: Jul 10, 1976, 43 y.o.   MRN: [id number]  HPI Here to follow up on an ER visit on 12-20-18 for upper abdominal pain and nausea. No fever. Her labs were all normal including a lipase. A CT scan was unremarkable and pancreatitis was ruled out. She had been taking Trulicity for 3 weeks and she was concerned about possible side effects, so she has stopped this. Since that day the pain has improved, but she still has some pain and nausea every day. BMs are normal. No UTI symptoms.   Review of Systems  Constitutional: Negative.   HENT: Negative for sneezing.   Respiratory: Negative.   Cardiovascular: Negative.   Gastrointestinal: Positive for abdominal pain and nausea. Negative for abdominal distention, blood in stool, constipation, diarrhea and vomiting.  Genitourinary: Negative.        Objective:   Physical Exam Constitutional:      Appearance: Normal appearance. She is well-developed. She is not ill-appearing.  Cardiovascular:     Rate and Rhythm: Normal rate and regular rhythm.     Pulses: Normal pulses.     Heart sounds: Normal heart sounds.  Pulmonary:     Effort: Pulmonary effort is normal.     Breath sounds: Normal breath sounds.  Abdominal:     General: Abdomen is flat. Bowel sounds are normal. There is no distension.     Palpations: Abdomen is soft. There is no mass.     Tenderness: There is no guarding.     Hernia: No hernia is present.     Comments: Mildly tender in both upper quadrants   Neurological:     Mental Status: She is alert.           Assessment & Plan:  Upper abdominal pain consistent with gastritis and/or duodenitis. Begin Pantoprazole 40 mg each morning and Pepcid AC each evening. Recheck prn.  Alysia Penna, MD

## 2018-12-26 ENCOUNTER — Ambulatory Visit: Payer: PRIVATE HEALTH INSURANCE | Admitting: Psychology

## 2019-01-06 ENCOUNTER — Ambulatory Visit: Payer: Self-pay | Admitting: Family Medicine

## 2019-01-08 ENCOUNTER — Other Ambulatory Visit: Payer: Self-pay

## 2019-01-08 ENCOUNTER — Ambulatory Visit (INDEPENDENT_AMBULATORY_CARE_PROVIDER_SITE_OTHER): Payer: BLUE CROSS/BLUE SHIELD | Admitting: Family Medicine

## 2019-01-08 ENCOUNTER — Encounter: Payer: Self-pay | Admitting: Family Medicine

## 2019-01-08 VITALS — BP 126/84 | HR 64 | Ht 62.0 in | Wt 218.8 lb

## 2019-01-08 DIAGNOSIS — M25561 Pain in right knee: Secondary | ICD-10-CM | POA: Diagnosis not present

## 2019-01-08 DIAGNOSIS — R0789 Other chest pain: Secondary | ICD-10-CM | POA: Diagnosis not present

## 2019-01-08 DIAGNOSIS — R0602 Shortness of breath: Secondary | ICD-10-CM | POA: Diagnosis not present

## 2019-01-08 DIAGNOSIS — M25562 Pain in left knee: Secondary | ICD-10-CM | POA: Diagnosis not present

## 2019-01-08 DIAGNOSIS — G8929 Other chronic pain: Secondary | ICD-10-CM

## 2019-01-08 MED ORDER — GABAPENTIN 300 MG PO CAPS: 300.0000 mg | ORAL_CAPSULE | Freq: Three times a day (TID) | ORAL | 2 refills | Status: DC

## 2019-01-08 NOTE — Patient Instructions (Addendum)
We will look into gelsyn for you and call with the details about this -- it would be 3 shots spaced a week apart from each other. Take gabapentin 100mg  morning, 100mg  afternoon, 300mg  at bedtime for a few days; Then 100mg  morning, 300mg  afternoon, 300mg  bedtime for a few days then Finally 300mg  three times a day.

## 2019-01-10 ENCOUNTER — Encounter: Payer: Self-pay | Admitting: Family Medicine

## 2019-01-10 DIAGNOSIS — M542 Cervicalgia: Secondary | ICD-10-CM | POA: Diagnosis not present

## 2019-01-10 DIAGNOSIS — M545 Low back pain: Secondary | ICD-10-CM | POA: Diagnosis not present

## 2019-01-10 DIAGNOSIS — R293 Abnormal posture: Secondary | ICD-10-CM | POA: Diagnosis not present

## 2019-01-10 DIAGNOSIS — M546 Pain in thoracic spine: Secondary | ICD-10-CM | POA: Diagnosis not present

## 2019-01-10 NOTE — Progress Notes (Signed)
PCP: Laurey Morale, MD  Subjective:   HPI: Patient is a 43 y.o. female here for left knee pain  12/16: Patient denies known injury or trauma. She states on Saturday during the day she developed a stabbing pain between her scapulae more on the left side. Has tried heat/ice and robaxin without much benefit. Pain level 7/10, sharp. Constant dull ache in this area though. Worse with reaching. No numbness, skin changes.  10/31/18: Patient reports her pain in the upper back has spread from upper back down low back. Pain level 6/10, a tightness. Has been using topical medication, icing, tizanidine twice a day. Taking gabapentin three times a day. Her left knee has been hurting past 2 weeks - locking at times. Worsened after twisting this in the bed. No skin changes, numbness.  2/3: Patient returns with persistent worsening left knee pain. Pain anterior and radiates posteriorly. Pain level 8-9/10 and constantly achy. Kind of catching especially when on stairs. Patellar strap helps some. Insurance just going through and should have soon. No skin changes. No new injuries.  3/11: Patient reports pain continues in her knees left worse than right anteriorly. Pain level 4/10 now, worse with walking and stairs, sharp. Better with rest. No skin changes.  Past Medical History:  Diagnosis Date  . Anxiety   . Arthritis   . Asthma   . Bowel obstruction ([place])   . Cardiac arrhythmia   . Depression    sees Dr. Matilde Haymaker in [place]   . Diabetes mellitus    type 2   . GERD (gastroesophageal reflux disease)   . Hyperlipidemia   . Hypertension   . Hypothyroidism    sees Dr. Elyse Hsu  . Insomnia   . Migraine syndrome   . Morbid obesity ([place])   . 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 Neck pain   . Neuropathy associated with endocrine disorder ([place])   . Sleep apnea with use of continuous positive airway pressure (CPAP)    Sleep apnea is resolved due to weight loss. 08/2017  . 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 Stroke [facility]) 04/2016    Current Outpatient Medications on File Prior to Visit  Medication Sig Dispense Refill  . albuterol (PROAIR HFA) 108 (90 Base) MCG/ACT inhaler Inhale 2 puffs into the lungs every 4 (four) hours as needed for wheezing. 8.5 g 11  . ARIPiprazole (ABILIFY) 30 MG tablet Take 30 mg by mouth daily.    . budesonide-formoterol (SYMBICORT) 160-4.5 MCG/ACT inhaler Inhale 2 puffs into the lungs 2 (two) times daily. 1 Inhaler 11  . buPROPion (WELLBUTRIN SR) 150 MG 12 hr tablet Take 2 tablets in the morning and 1 tablet in the evening    . clopidogrel (PLAVIX) 75 MG tablet TAKE 1 TABLET(75 MG) BY MOUTH DAILY 90 tablet 0  . hydrOXYzine (ATARAX/VISTARIL) 50 MG tablet Take 50 mg by mouth. Take 1 tablet every night and may take 3 other times as needed    . insulin aspart (NOVOLOG) 100 UNIT/ML injection Use in the insulin pump as directed 10 mL 0  . insulin lispro (HUMALOG) 100 UNIT/ML injection ADM 40 UNI [place] D    . levothyroxine (SYNTHROID, LEVOTHROID) 125 MCG tablet Take 1 tablet (125 mcg total) by mouth daily before breakfast. 90 tablet 3  . metoprolol succinate (TOPROL-XL) 25 MG 24 hr tablet Take 0.5 tablets (12.5 mg total) by mouth daily. 30 tablet 0  . Multiple Vitamin (MULTIVITAMIN) tablet Take 1 tablet by mouth  daily.    . norethindrone (MICRONOR,CAMILA,ERRIN) 0.35 MG tablet     . nystatin (NYSTATIN) powder Apply topically 4 (four) times daily. 60 g 5  . pantoprazole (PROTONIX) 40 MG tablet Take 1 tablet (40 mg total) by mouth daily. 30 tablet 3  . prazosin (MINIPRESS) 5 MG capsule Take 10 mg by mouth at bedtime.    . promethazine (PHENERGAN) 25 MG tablet Take 1 tablet (25 mg total) every 6 (six) hours as needed by mouth for nausea or vomiting. 10 tablet 0  . rizatriptan (MAXALT) 10 MG tablet Take 1 tablet (10 mg total) by mouth as needed for migraine. May repeat in 2 hours if needed 30 tablet 5  . rosuvastatin (CRESTOR) 20 MG tablet Take one tablet by  mouth daily 30 tablet 0  . tiZANidine (ZANAFLEX) 2 MG tablet Take 1 tablet (2 mg total) by mouth every 8 (eight) hours as needed for muscle spasms. 60 tablet 1  . topiramate (TOPAMAX) 100 MG tablet Take 100 mg by mouth 2 (two) times daily.     . traZODone (DESYREL) 150 MG tablet Take by mouth at bedtime.    . valACYclovir (VALTREX) 1000 MG tablet Take 1 tablet (1,000 mg total) by mouth 3 (three) times daily. (Patient taking differently: Take 1,000 mg by mouth 3 (three) times daily as needed. ) 21 tablet 5   No current facility-administered medications on file prior to visit.     Past Surgical History:  Procedure Laterality Date  . blocked itestinal repair  34   age 55 months  . BREAST BIOPSY Left 2017  . CHOLECYSTECTOMY  2011  . INDUCED ABORTION  1996   forced abortion  . INGUINAL HERNIA REPAIR Left 1980   age 17  . LAPAROSCOPIC ENDOMETRIOSIS FULGURATION  1998  . WISDOM TOOTH EXTRACTION  2007    Allergies  Allergen Reactions  . Macrobid [Nitrofurantoin] Hives  . Metformin And Related Other (See Comments)    Abdominal cramping   . Byetta 10 Mcg Pen [Exenatide] Hives  . Clindamycin/Lincomycin Nausea And Vomiting  . Geodon [Ziprasidone Hcl]   . Lipitor [Atorvastatin]     Per patient this causes cramps  . Nsaids     Upset stomach   . Penicillins Other (See Comments)    Has patient had a PCN reaction causing immediate rash, facial/tongue/throat swelling, SOB or lightheadedness with hypotension: No Has patient had a PCN reaction causing severe rash involving mucus membranes or skin necrosis: No Has patient had a PCN reaction that required hospitalization No Has patient had a PCN reaction occurring within the last 10 years: No If all of the above answers are "NO", then may proceed with Cephalosporin use.   Marland Kitchen Avocado Diarrhea and Other (See Comments)    Severe cramping, sweats, and diarrhea in upper GI/stomach  . Tramadol Hcl Itching and Rash    Social History   Socioeconomic  History  . Marital status: Divorced    Spouse name: Not on file  . Number of children: 5  . Years of education: Not on file  . Highest education level: Not on file  Occupational History  . Occupation: Nurse, learning disability  Social Needs  . Financial resource strain: Not on file  . Food insecurity:    Worry: Not on file    Inability: Not on file  . Transportation needs:    Medical: Not on file    Non-medical: Not on file  Tobacco Use  . Smoking status: Former Smoker  Packs/day: 0.50    Years: 0.50    Pack years: 0.25    Types: Cigarettes    Start date: 02/24/2018  . Smokeless tobacco: Never Used  . Tobacco comment: quit 03/25/2016  Substance and Sexual Activity  . Alcohol use: No    Alcohol/week: 0.0 standard drinks  . Drug use: Yes    Types: Marijuana    Comment: 2/10 last marijuana use  . Sexual activity: Not on file  Lifestyle  . Physical activity:    Days per week: Not on file    Minutes per session: Not on file  . Stress: Not on file  Relationships  . Social connections:    Talks on phone: Not on file    Gets together: Not on file    Attends religious service: Not on file    Active member of club or organization: Not on file    Attends meetings of clubs or organizations: Not on file    Relationship status: Not on file  . Intimate partner violence:    Fear of current or ex partner: Not on file    Emotionally abused: Not on file    Physically abused: Not on file    Forced sexual activity: Not on file  Other Topics Concern  . Not on file  Social History Narrative  . Not on file    Family History  Adopted: Yes  Problem Relation Age of Onset  . Heart disease Unknown        on both sides of family  . Diabetes Unknown        on both sides of family    BP 126/84   Pulse 64   Ht 5\' 2"  (1.575 m)   Wt 218 lb 12.8 oz (99.2 kg)   BMI 40.02 kg/m   Review of Systems: See HPI above.     Objective:  Physical Exam:  Gen: NAD, comfortable in  exam room  Left knee: No gross deformity, ecchymoses, swelling. TTP medial > lateral joint lines. FROM with 5/5 strength flexion and extension. Negative ant/post drawers. Negative valgus/varus testing. Negative lachmans. Negative mcmurrays, apleys, patellar apprehension. NV intact distally.  Right knee: No gross deformity, ecchymoses, swelling. TTP medial > lateral joint lines. FROM with 5/5 strength flexion and extension. Negative ant/post drawers. Negative valgus/varus testing. Negative lachmans. Negative mcmurrays, apleys, patellar apprehension. NV intact distally.  Assessment & Plan:  1. Bilateral knee pain - 2/2 arthritis primarily now.  Worse in left knee.  Not improving with steroid injection - discussed options.  Will go ahead with approval for gelsyn.  We will go up on gabapentin as well.

## 2019-01-13 ENCOUNTER — Other Ambulatory Visit: Payer: Self-pay | Admitting: Family Medicine

## 2019-01-16 ENCOUNTER — Other Ambulatory Visit: Payer: Self-pay

## 2019-01-16 ENCOUNTER — Encounter: Payer: Self-pay | Admitting: Family Medicine

## 2019-01-16 ENCOUNTER — Ambulatory Visit (INDEPENDENT_AMBULATORY_CARE_PROVIDER_SITE_OTHER): Payer: BLUE CROSS/BLUE SHIELD | Admitting: Family Medicine

## 2019-01-16 DIAGNOSIS — M1712 Unilateral primary osteoarthritis, left knee: Secondary | ICD-10-CM

## 2019-01-17 ENCOUNTER — Encounter: Payer: Self-pay | Admitting: Family Medicine

## 2019-01-17 ENCOUNTER — Telehealth: Payer: Self-pay | Admitting: Family Medicine

## 2019-01-17 DIAGNOSIS — Z0279 Encounter for issue of other medical certificate: Secondary | ICD-10-CM

## 2019-01-17 DIAGNOSIS — M546 Pain in thoracic spine: Secondary | ICD-10-CM | POA: Diagnosis not present

## 2019-01-17 DIAGNOSIS — M542 Cervicalgia: Secondary | ICD-10-CM | POA: Diagnosis not present

## 2019-01-17 DIAGNOSIS — M545 Low back pain: Secondary | ICD-10-CM | POA: Diagnosis not present

## 2019-01-17 DIAGNOSIS — R293 Abnormal posture: Secondary | ICD-10-CM | POA: Diagnosis not present

## 2019-01-17 NOTE — Telephone Encounter (Signed)
[place] DOT/DMV form to be filled out.  Placed in dr's folder.   Fax to: [phone], Attn: DOT/DMV

## 2019-01-17 NOTE — Progress Notes (Signed)
PCP: Laurey Morale, MD  Subjective:   HPI: Patient is a 43 y.o. female here for left knee pain  12/16: Patient denies known injury or trauma. She states on Saturday during the day she developed a stabbing pain between her scapulae more on the left side. Has tried heat/ice and robaxin without much benefit. Pain level 7/10, sharp. Constant dull ache in this area though. Worse with reaching. No numbness, skin changes.  10/31/18: Patient reports her pain in the upper back has spread from upper back down low back. Pain level 6/10, a tightness. Has been using topical medication, icing, tizanidine twice a day. Taking gabapentin three times a day. Her left knee has been hurting past 2 weeks - locking at times. Worsened after twisting this in the bed. No skin changes, numbness.  2/3: Patient returns with persistent worsening left knee pain. Pain anterior and radiates posteriorly. Pain level 8-9/10 and constantly achy. Kind of catching especially when on stairs. Patellar strap helps some. Insurance just going through and should have soon. No skin changes. No new injuries.  3/11: Patient reports pain continues in her knees left worse than right anteriorly. Pain level 4/10 now, worse with walking and stairs, sharp. Better with rest. No skin changes.  3/19: Patient returns for first gelsyn injection Pain level 7/10 anterior knee. Have some medial numbness distal thigh, medial lower leg.   No associated low back pain, weakness, bowel/bladder dysfunction. No injuries since last visit.  Past Medical History:  Diagnosis Date  . Anxiety   . Arthritis   . Asthma   . Bowel obstruction ([place])   . Cardiac arrhythmia   . Depression    sees Dr. Matilde Haymaker in [place]   . Diabetes mellitus    type 2   . GERD (gastroesophageal reflux disease)   . Hyperlipidemia   . Hypertension   . Hypothyroidism    sees Dr. Elyse Hsu  . Insomnia   . Migraine syndrome   . Morbid obesity  ([place])   . 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 Neck pain   . Neuropathy associated with endocrine disorder ([place])   . Sleep apnea with use of continuous positive airway pressure (CPAP)    Sleep apnea is resolved due to weight loss. 08/2017  . Stroke [facility]) 03-25-16   left MCA   . Stroke [facility]) 04/2016    Current Outpatient Medications on File Prior to Visit  Medication Sig Dispense Refill  . albuterol (PROAIR HFA) 108 (90 Base) MCG/ACT inhaler Inhale 2 puffs into the lungs every 4 (four) hours as needed for wheezing. 8.5 g 11  . ARIPiprazole (ABILIFY) 30 MG tablet Take 30 mg by mouth daily.    . budesonide-formoterol (SYMBICORT) 160-4.5 MCG/ACT inhaler Inhale 2 puffs into the lungs 2 (two) times daily. 1 Inhaler 11  . buPROPion (WELLBUTRIN SR) 150 MG 12 hr tablet Take 2 tablets in the morning and 1 tablet in the evening    . clopidogrel (PLAVIX) 75 MG tablet TAKE 1 TABLET(75 MG) BY MOUTH DAILY 90 tablet 0  . gabapentin (NEURONTIN) 300 MG capsule Take 1 capsule (300 mg total) by mouth 3 (three) times daily. 90 capsule 2  . hydrOXYzine (ATARAX/VISTARIL) 50 MG tablet Take 50 mg by mouth. Take 1 tablet every night and may take 3 other times as needed    . insulin aspart (NOVOLOG) 100 UNIT/ML injection Use in the insulin pump as directed 10 mL 0  . insulin lispro (HUMALOG) 100 UNIT/ML injection ADM 40 UNI  [place] D    . levothyroxine (SYNTHROID, LEVOTHROID) 125 MCG tablet Take 1 tablet (125 mcg total) by mouth daily before breakfast. 90 tablet 3  . metoprolol succinate (TOPROL-XL) 25 MG 24 hr tablet Take 0.5 tablets (12.5 mg total) by mouth daily. 30 tablet 0  . Multiple Vitamin (MULTIVITAMIN) tablet Take 1 tablet by mouth daily.    . norethindrone (MICRONOR,CAMILA,ERRIN) 0.35 MG tablet     . nystatin (NYSTATIN) powder Apply topically 4 (four) times daily. 60 g 5  . pantoprazole (PROTONIX) 40 MG tablet Take 1 tablet (40 mg total) by mouth daily. 30 tablet 3  . prazosin (MINIPRESS) 5 MG capsule Take 10 mg by  mouth at bedtime.    . promethazine (PHENERGAN) 25 MG tablet Take 1 tablet (25 mg total) every 6 (six) hours as needed by mouth for nausea or vomiting. 10 tablet 0  . rizatriptan (MAXALT) 10 MG tablet Take 1 tablet (10 mg total) by mouth as needed for migraine. May repeat in 2 hours if needed 30 tablet 5  . rosuvastatin (CRESTOR) 20 MG tablet TAKE ONE TABLET BY MOUTH DAILY 30 tablet 6  . tiZANidine (ZANAFLEX) 2 MG tablet TAKE ONE TABLET BY MOUTH EVERY 8 HOURS AS NEEDED FOR MUSCLE SPASMS 60 tablet 1  . topiramate (TOPAMAX) 100 MG tablet Take 100 mg by mouth 2 (two) times daily.     . traZODone (DESYREL) 150 MG tablet Take by mouth at bedtime.    . valACYclovir (VALTREX) 1000 MG tablet Take 1 tablet (1,000 mg total) by mouth 3 (three) times daily. (Patient taking differently: Take 1,000 mg by mouth 3 (three) times daily as needed. ) 21 tablet 5   No current facility-administered medications on file prior to visit.     Past Surgical History:  Procedure Laterality Date  . blocked itestinal repair  5   age 41 months  . BREAST BIOPSY Left 2017  . CHOLECYSTECTOMY  2011  . INDUCED ABORTION  1996   forced abortion  . INGUINAL HERNIA REPAIR Left 1980   age 20  . LAPAROSCOPIC ENDOMETRIOSIS FULGURATION  1998  . WISDOM TOOTH EXTRACTION  2007    Allergies  Allergen Reactions  . Macrobid [Nitrofurantoin] Hives  . Metformin And Related Other (See Comments)    Abdominal cramping   . Byetta 10 Mcg Pen [Exenatide] Hives  . Clindamycin/Lincomycin Nausea And Vomiting  . Geodon [Ziprasidone Hcl]   . Lipitor [Atorvastatin]     Per patient this causes cramps  . Nsaids     Upset stomach   . Penicillins Other (See Comments)    Has patient had a PCN reaction causing immediate rash, facial/tongue/throat swelling, SOB or lightheadedness with hypotension: No Has patient had a PCN reaction causing severe rash involving mucus membranes or skin necrosis: No Has patient had a PCN reaction that required  hospitalization No Has patient had a PCN reaction occurring within the last 10 years: No If all of the above answers are "NO", then may proceed with Cephalosporin use.   Marland Kitchen Avocado Diarrhea and Other (See Comments)    Severe cramping, sweats, and diarrhea in upper GI/stomach  . Tramadol Hcl Itching and Rash    Social History   Socioeconomic History  . Marital status: Divorced    Spouse name: Not on file  . Number of children: 5  . Years of education: Not on file  . Highest education level: Not on file  Occupational History  . Occupation: Nurse, learning disability  Social Needs  .  Financial resource strain: Not on file  . Food insecurity:    Worry: Not on file    Inability: Not on file  . Transportation needs:    Medical: Not on file    Non-medical: Not on file  Tobacco Use  . Smoking status: Former Smoker    Packs/day: 0.50    Years: 0.50    Pack years: 0.25    Types: Cigarettes    Start date: 02/24/2018  . Smokeless tobacco: Never Used  . Tobacco comment: quit 03/25/2016  Substance and Sexual Activity  . Alcohol use: No    Alcohol/week: 0.0 standard drinks  . Drug use: Yes    Types: Marijuana    Comment: 2/10 last marijuana use  . Sexual activity: Not on file  Lifestyle  . Physical activity:    Days per week: Not on file    Minutes per session: Not on file  . Stress: Not on file  Relationships  . Social connections:    Talks on phone: Not on file    Gets together: Not on file    Attends religious service: Not on file    Active member of club or organization: Not on file    Attends meetings of clubs or organizations: Not on file    Relationship status: Not on file  . Intimate partner violence:    Fear of current or ex partner: Not on file    Emotionally abused: Not on file    Physically abused: Not on file    Forced sexual activity: Not on file  Other Topics Concern  . Not on file  Social History Narrative  . Not on file    Family History   Adopted: Yes  Problem Relation Age of Onset  . Heart disease Unknown        on both sides of family  . Diabetes Unknown        on both sides of family    BP 117/74   Pulse 71   Temp 98 F (36.7 C) (Oral)   Ht 5\' 2"  (1.575 m)   Wt 218 lb (98.9 kg)   BMI 39.87 kg/m   Review of Systems: See HPI above.     Objective:  Physical Exam:  Gen: NAD, comfortable in exam room  Rest of exam not repeated today. Left knee: No gross deformity, ecchymoses, swelling. TTP medial > lateral joint lines. FROM with 5/5 strength flexion and extension. Negative ant/post drawers. Negative valgus/varus testing. Negative lachmans. Negative mcmurrays, apleys, patellar apprehension. NV intact distally.  Right knee: No gross deformity, ecchymoses, swelling. TTP medial > lateral joint lines. FROM with 5/5 strength flexion and extension. Negative ant/post drawers. Negative valgus/varus testing. Negative lachmans. Negative mcmurrays, apleys, patellar apprehension. NV intact distally.  Assessment & Plan:  1. Left knee pain - 2/2 severe medial arthritis.  First gelsyn-3 injection given today.  Numbness in a non-dermatomal distribution consistent with irritation of cutaneous nerve branch - reassured.  F/u in 1 week for second injection.  After informed written consent timeout was performed, patient was seated on exam table. Left knee was prepped with alcohol swab and utilizing anteromedial approach, patient's left knee was injected intraarticularly with 66mL bupivicaine followed by gelsyn-3. Patient tolerated the procedure well without immediate complications.

## 2019-01-20 ENCOUNTER — Telehealth: Payer: Self-pay | Admitting: Family Medicine

## 2019-01-20 NOTE — Telephone Encounter (Signed)
Spoke to patient and gave her information provided by the physician.

## 2019-01-20 NOTE — Telephone Encounter (Signed)
The only thing she dropped off is the 2 cover sheets. Most of the form that I fill out is missing

## 2019-01-20 NOTE — Telephone Encounter (Signed)
Forms have been placed in the red folder.  Dr. Sarajane Jews please advise once completed.  Thanks

## 2019-01-20 NOTE — Telephone Encounter (Signed)
Patient states she has worse pain since the gel-syn injection last Thursday. She is not experiencing any swelling or redness, but states her knees are stiff and is having a difficult time standing up from a seated position and going up stairs.    Patient asking if this is typical or if she should have seen some improvement from the injection.

## 2019-01-20 NOTE — Telephone Encounter (Signed)
As long as she's not having fever, redness she's ok.  Unfortunately it's common to feel the same if not a little worse after the shot because of the volume of fluid put in there.  And it takes a few weeks before the gel injections start to help.  Encourage her to use ice, topical aspercreme, her usual gabapentin and tizanidine.

## 2019-01-21 DIAGNOSIS — M542 Cervicalgia: Secondary | ICD-10-CM | POA: Diagnosis not present

## 2019-01-21 DIAGNOSIS — R293 Abnormal posture: Secondary | ICD-10-CM | POA: Diagnosis not present

## 2019-01-21 DIAGNOSIS — M545 Low back pain: Secondary | ICD-10-CM | POA: Diagnosis not present

## 2019-01-21 DIAGNOSIS — M546 Pain in thoracic spine: Secondary | ICD-10-CM | POA: Diagnosis not present

## 2019-01-22 ENCOUNTER — Ambulatory Visit: Payer: Self-pay | Admitting: *Deleted

## 2019-01-22 ENCOUNTER — Telehealth: Payer: Self-pay | Admitting: Family Medicine

## 2019-01-22 MED ORDER — ALBUTEROL SULFATE HFA 108 (90 BASE) MCG/ACT IN AERS: 2.0000 | INHALATION_SPRAY | RESPIRATORY_TRACT | 11 refills | Status: AC | PRN

## 2019-01-22 NOTE — Telephone Encounter (Signed)
Pt reports H/O reflux, "Told I had hiatal hernia in the past." States increased reflux yesterday evening. Reports during night, "Felt pain and pressure, like there was a lump in my throat." Reports at 5-6/10 when occurs.States has taken her protonix. Pain has subsided "Some" after eating this AM. States occurs when bending over  and at times with "Any movement." "Feels like something is coming up in my throat, a lump." Reviewed home care advise with patient in regards to reflux. Aware [place] would route to practice. Pt's email verified. Has capability for Web Ex appt. Instructed pt to go to ED if symptoms worsen. Please advise.. Reason for Disposition . [1] MODERATE pain (e.g., interferes with normal activities) AND [2] pain comes and goes (cramps) AND [3] present > 24 hours  (Exception: pain with Vomiting or Diarrhea - see that Guideline)  Answer Assessment - Initial Assessment Questions 1. LOCATION: "Where does it hurt?"      Upper abdomen, "Like reflux" 2. RADIATION: "Does the pain shoot anywhere else?" (e.g., chest, back)     throat 3. ONSET: "When did the pain begin?" (e.g., minutes, hours or days ago)      Last evening 4. SUDDEN: "Gradual or sudden onset?"     Gradual 5. PATTERN "Does the pain come and go, or is it constant?"    - If constant: "Is it getting better, staying the same, or worsening?"      (Note: Constant means the pain never goes away completely; most serious pain is constant and it progresses)     - If intermittent: "How long does it last?" "Do you have pain now?"     (Note: Intermittent means the pain goes away completely between bouts)     With bending over, varies 6. SEVERITY: "How bad is the pain?"  (e.g., Scale 1-10; mild, moderate, or severe)   - MILD (1-3): doesn't interfere with normal activities, abdomen soft and not tender to touch    - MODERATE (4-7): interferes with normal activities or awakens from sleep, tender to touch    - SEVERE (8-10): excruciating pain,  doubled over, unable to do any normal activities      6/10 7. RECURRENT SYMPTOM: "Have you ever had this type of abdominal pain before?" If so, ask: "When was the last time?" and "What happened that time?"      no 8. CAUSE: "What do you think is causing the abdominal pain?"     Reflux but this feels different, maybe hiatal hernia 9. RELIEVING/AGGRAVATING FACTORS: "What makes it better or worse?" (e.g., movement, antacids, bowel movement)    Unsure 10. OTHER SYMPTOMS: "Has there been any vomiting, diarrhea, constipation, or urine problems?"       no  Protocols used: ABDOMINAL PAIN - [facility]

## 2019-01-22 NOTE — Telephone Encounter (Signed)
Copied from [place] [id number]. Topic: Quick Communication - Rx Refill/Question >> Jan 22, 2019  9:28 AM Leward Quan A wrote: Medication: albuterol (PROAIR HFA) 108 (90 Base) MCG/ACT inhaler  Has the patient contacted their pharmacy? Yes.   (Agent: If no, request that the patient contact the pharmacy for the refill.) (Agent: If yes, when and what did the pharmacy advise?)  Preferred Pharmacy (with phone number or street name): [facility], [place]. [address] [phone] (Phone) [phone] (Fax)    Agent: Please be advised that RX refills may take up to 3 business days. We ask that you follow-up with your pharmacy.

## 2019-01-22 NOTE — Telephone Encounter (Signed)
Forms that we had for DMV have been faxed back in per the pts request. Papers placed in the scan folder to get scanned into her chart.

## 2019-01-22 NOTE — Telephone Encounter (Signed)
Per Dr. Sarajane Jews:  Try to increase the protonix to BID for 1 week and see if this helps.  If this does not help, then call back and we will schedule visit with Dr. Sarajane Jews.    I have called and spoke with pt and she is aware and will start taking the protonix Bid

## 2019-01-23 ENCOUNTER — Ambulatory Visit (INDEPENDENT_AMBULATORY_CARE_PROVIDER_SITE_OTHER): Payer: BC Managed Care – PPO | Admitting: Psychology

## 2019-01-23 DIAGNOSIS — M546 Pain in thoracic spine: Secondary | ICD-10-CM | POA: Diagnosis not present

## 2019-01-23 DIAGNOSIS — M545 Low back pain: Secondary | ICD-10-CM | POA: Diagnosis not present

## 2019-01-23 DIAGNOSIS — M542 Cervicalgia: Secondary | ICD-10-CM | POA: Diagnosis not present

## 2019-01-23 DIAGNOSIS — F4312 Post-traumatic stress disorder, chronic: Secondary | ICD-10-CM | POA: Diagnosis not present

## 2019-01-23 DIAGNOSIS — F4481 Dissociative identity disorder: Secondary | ICD-10-CM | POA: Diagnosis not present

## 2019-01-23 DIAGNOSIS — R293 Abnormal posture: Secondary | ICD-10-CM | POA: Diagnosis not present

## 2019-01-24 ENCOUNTER — Ambulatory Visit: Payer: Self-pay | Admitting: Family Medicine

## 2019-01-27 ENCOUNTER — Ambulatory Visit (INDEPENDENT_AMBULATORY_CARE_PROVIDER_SITE_OTHER): Payer: BLUE CROSS/BLUE SHIELD | Admitting: Family Medicine

## 2019-01-27 ENCOUNTER — Encounter: Payer: Self-pay | Admitting: Family Medicine

## 2019-01-27 ENCOUNTER — Other Ambulatory Visit: Payer: Self-pay

## 2019-01-27 DIAGNOSIS — M1712 Unilateral primary osteoarthritis, left knee: Secondary | ICD-10-CM

## 2019-01-27 NOTE — Progress Notes (Signed)
PCP: Laurey Morale, MD  Subjective:   HPI: Patient is a 43 y.o. female here for left knee pain  12/16: Patient denies known injury or trauma. She states on Saturday during the day she developed a stabbing pain between her scapulae more on the left side. Has tried heat/ice and robaxin without much benefit. Pain level 7/10, sharp. Constant dull ache in this area though. Worse with reaching. No numbness, skin changes.  10/31/18: Patient reports her pain in the upper back has spread from upper back down low back. Pain level 6/10, a tightness. Has been using topical medication, icing, tizanidine twice a day. Taking gabapentin three times a day. Her left knee has been hurting past 2 weeks - locking at times. Worsened after twisting this in the bed. No skin changes, numbness.  2/3: Patient returns with persistent worsening left knee pain. Pain anterior and radiates posteriorly. Pain level 8-9/10 and constantly achy. Kind of catching especially when on stairs. Patellar strap helps some. Insurance just going through and should have soon. No skin changes. No new injuries.  3/11: Patient reports pain continues in her knees left worse than right anteriorly. Pain level 4/10 now, worse with walking and stairs, sharp. Better with rest. No skin changes.  3/19: Patient returns for first gelsyn injection Pain level 7/10 anterior knee. Have some medial numbness distal thigh, medial lower leg.   No associated low back pain, weakness, bowel/bladder dysfunction. No injuries since last visit.  3/30: Patient returns for her second gelsyn injection. Pain level 6/10 anterior knee.  Past Medical History:  Diagnosis Date  . Anxiety   . Arthritis   . Asthma   . Bowel obstruction ([place])   . Cardiac arrhythmia   . Depression    sees Dr. Matilde Haymaker in [place]   . Diabetes mellitus    type 2   . GERD (gastroesophageal reflux disease)   . Hyperlipidemia   . Hypertension   .  Hypothyroidism    sees Dr. Elyse Hsu  . Insomnia   . Migraine syndrome   . Morbid obesity ([place])   . 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 Neck pain   . Neuropathy associated with endocrine disorder ([place])   . Sleep apnea with use of continuous positive airway pressure (CPAP)    Sleep apnea is resolved due to weight loss. 08/2017  . 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 Stroke [facility]) 04/2016    Current Outpatient Medications on File Prior to Visit  Medication Sig Dispense Refill  . albuterol (PROAIR HFA) 108 (90 Base) MCG/ACT inhaler Inhale 2 puffs into the lungs every 4 (four) hours as needed for wheezing. 8.5 g 11  . ARIPiprazole (ABILIFY) 30 MG tablet Take 30 mg by mouth daily.    . budesonide-formoterol (SYMBICORT) 160-4.5 MCG/ACT inhaler Inhale 2 puffs into the lungs 2 (two) times daily. 1 Inhaler 11  . buPROPion (WELLBUTRIN SR) 150 MG 12 hr tablet Take 2 tablets in the morning and 1 tablet in the evening    . clopidogrel (PLAVIX) 75 MG tablet TAKE 1 TABLET(75 MG) BY MOUTH DAILY 90 tablet 0  . gabapentin (NEURONTIN) 300 MG capsule Take 1 capsule (300 mg total) by mouth 3 (three) times daily. 90 capsule 2  . hydrOXYzine (ATARAX/VISTARIL) 50 MG tablet Take 50 mg by mouth. Take 1 tablet every night and may take 3 other times as needed    . insulin aspart (NOVOLOG) 100 UNIT/ML injection Use in the insulin pump as directed  10 mL 0  . insulin lispro (HUMALOG) 100 UNIT/ML injection ADM 40 UNI [place] D    . levothyroxine (SYNTHROID, LEVOTHROID) 125 MCG tablet Take 1 tablet (125 mcg total) by mouth daily before breakfast. 90 tablet 3  . metoprolol succinate (TOPROL-XL) 25 MG 24 hr tablet Take 0.5 tablets (12.5 mg total) by mouth daily. 30 tablet 0  . Multiple Vitamin (MULTIVITAMIN) tablet Take 1 tablet by mouth daily.    . norethindrone (MICRONOR,CAMILA,ERRIN) 0.35 MG tablet     . nystatin (NYSTATIN) powder Apply topically 4 (four) times daily. 60 g 5  . pantoprazole (PROTONIX) 40 MG tablet Take 1 tablet  (40 mg total) by mouth daily. 30 tablet 3  . prazosin (MINIPRESS) 5 MG capsule Take 10 mg by mouth at bedtime.    . promethazine (PHENERGAN) 25 MG tablet Take 1 tablet (25 mg total) every 6 (six) hours as needed by mouth for nausea or vomiting. 10 tablet 0  . rizatriptan (MAXALT) 10 MG tablet Take 1 tablet (10 mg total) by mouth as needed for migraine. May repeat in 2 hours if needed 30 tablet 5  . rosuvastatin (CRESTOR) 20 MG tablet TAKE ONE TABLET BY MOUTH DAILY 30 tablet 6  . tiZANidine (ZANAFLEX) 2 MG tablet TAKE ONE TABLET BY MOUTH EVERY 8 HOURS AS NEEDED FOR MUSCLE SPASMS 60 tablet 1  . topiramate (TOPAMAX) 100 MG tablet Take 100 mg by mouth 2 (two) times daily.     . traZODone (DESYREL) 150 MG tablet Take by mouth at bedtime.    . valACYclovir (VALTREX) 1000 MG tablet Take 1 tablet (1,000 mg total) by mouth 3 (three) times daily. (Patient taking differently: Take 1,000 mg by mouth 3 (three) times daily as needed. ) 21 tablet 5   No current facility-administered medications on file prior to visit.     Past Surgical History:  Procedure Laterality Date  . blocked itestinal repair  74   age 64 months  . BREAST BIOPSY Left 2017  . CHOLECYSTECTOMY  2011  . INDUCED ABORTION  1996   forced abortion  . INGUINAL HERNIA REPAIR Left 1980   age 56  . LAPAROSCOPIC ENDOMETRIOSIS FULGURATION  1998  . WISDOM TOOTH EXTRACTION  2007    Allergies  Allergen Reactions  . Macrobid [Nitrofurantoin] Hives  . Metformin And Related Other (See Comments)    Abdominal cramping   . Byetta 10 Mcg Pen [Exenatide] Hives  . Clindamycin/Lincomycin Nausea And Vomiting  . Geodon [Ziprasidone Hcl]   . Lipitor [Atorvastatin]     Per patient this causes cramps  . Nsaids     Upset stomach   . Penicillins Other (See Comments)    Has patient had a PCN reaction causing immediate rash, facial/tongue/throat swelling, SOB or lightheadedness with hypotension: No Has patient had a PCN reaction causing severe rash  involving mucus membranes or skin necrosis: No Has patient had a PCN reaction that required hospitalization No Has patient had a PCN reaction occurring within the last 10 years: No If all of the above answers are "NO", then may proceed with Cephalosporin use.   Marland Kitchen Avocado Diarrhea and Other (See Comments)    Severe cramping, sweats, and diarrhea in upper GI/stomach  . Tramadol Hcl Itching and Rash    Social History   Socioeconomic History  . Marital status: Divorced    Spouse name: Not on file  . Number of children: 5  . Years of education: Not on file  . Highest education level: Not  on file  Occupational History  . Occupation: Nurse, learning disability  Social Needs  . Financial resource strain: Not on file  . Food insecurity:    Worry: Not on file    Inability: Not on file  . Transportation needs:    Medical: Not on file    Non-medical: Not on file  Tobacco Use  . Smoking status: Former Smoker    Packs/day: 0.50    Years: 0.50    Pack years: 0.25    Types: Cigarettes    Start date: 02/24/2018  . Smokeless tobacco: Never Used  . Tobacco comment: quit 03/25/2016  Substance and Sexual Activity  . Alcohol use: No    Alcohol/week: 0.0 standard drinks  . Drug use: Yes    Types: Marijuana    Comment: 2/10 last marijuana use  . Sexual activity: Not on file  Lifestyle  . Physical activity:    Days per week: Not on file    Minutes per session: Not on file  . Stress: Not on file  Relationships  . Social connections:    Talks on phone: Not on file    Gets together: Not on file    Attends religious service: Not on file    Active member of club or organization: Not on file    Attends meetings of clubs or organizations: Not on file    Relationship status: Not on file  . Intimate partner violence:    Fear of current or ex partner: Not on file    Emotionally abused: Not on file    Physically abused: Not on file    Forced sexual activity: Not on file  Other Topics  Concern  . Not on file  Social History Narrative  . Not on file    Family History  Adopted: Yes  Problem Relation Age of Onset  . Heart disease Unknown        on both sides of family  . Diabetes Unknown        on both sides of family    BP (!) 92/58   Pulse 89   Temp 98.9 F (37.2 C) (Oral)   Ht 5\' 2"  (1.575 m)   Wt 220 lb (99.8 kg)   BMI 40.24 kg/m   Review of Systems: See HPI above.     Objective:  Physical Exam:  Gen: NAD, comfortable in exam room  Rest of exam not repeated today. Left knee: No gross deformity, ecchymoses, swelling. TTP medial > lateral joint lines. FROM with 5/5 strength flexion and extension. Negative ant/post drawers. Negative valgus/varus testing. Negative lachmans. Negative mcmurrays, apleys, patellar apprehension. NV intact distally.  Right knee: No gross deformity, ecchymoses, swelling. TTP medial > lateral joint lines. FROM with 5/5 strength flexion and extension. Negative ant/post drawers. Negative valgus/varus testing. Negative lachmans. Negative mcmurrays, apleys, patellar apprehension. NV intact distally.  Assessment & Plan:  1. Left knee pain - 2/2 severe medial arthritis.  Second gelsyn-3 injection given today.  F/u in 1 week for third injection.  After informed written consent timeout was performed, patient was seated on exam table. Left knee was prepped with alcohol swab and utilizing anteromedial approach, patient's left knee was injected intraarticularly with 39mL bupivicaine followed by gelsyn-3.  Patient tolerated the procedure well without immediate complications.

## 2019-01-30 ENCOUNTER — Ambulatory Visit (INDEPENDENT_AMBULATORY_CARE_PROVIDER_SITE_OTHER): Payer: BLUE CROSS/BLUE SHIELD | Admitting: Psychology

## 2019-01-30 DIAGNOSIS — F4481 Dissociative identity disorder: Secondary | ICD-10-CM | POA: Diagnosis not present

## 2019-02-03 ENCOUNTER — Encounter: Payer: Self-pay | Admitting: Family Medicine

## 2019-02-03 ENCOUNTER — Ambulatory Visit (INDEPENDENT_AMBULATORY_CARE_PROVIDER_SITE_OTHER): Payer: BLUE CROSS/BLUE SHIELD | Admitting: Family Medicine

## 2019-02-03 ENCOUNTER — Other Ambulatory Visit: Payer: Self-pay

## 2019-02-03 DIAGNOSIS — M1712 Unilateral primary osteoarthritis, left knee: Secondary | ICD-10-CM | POA: Diagnosis not present

## 2019-02-03 NOTE — Progress Notes (Signed)
PCP: Laurey Morale, MD  Subjective:   HPI: Patient is a 43 y.o. female here for left knee pain  12/16: Patient denies known injury or trauma. She states on Saturday during the day she developed a stabbing pain between her scapulae more on the left side. Has tried heat/ice and robaxin without much benefit. Pain level 7/10, sharp. Constant dull ache in this area though. Worse with reaching. No numbness, skin changes.  10/31/18: Patient reports her pain in the upper back has spread from upper back down low back. Pain level 6/10, a tightness. Has been using topical medication, icing, tizanidine twice a day. Taking gabapentin three times a day. Her left knee has been hurting past 2 weeks - locking at times. Worsened after twisting this in the bed. No skin changes, numbness.  2/3: Patient returns with persistent worsening left knee pain. Pain anterior and radiates posteriorly. Pain level 8-9/10 and constantly achy. Kind of catching especially when on stairs. Patellar strap helps some. Insurance just going through and should have soon. No skin changes. No new injuries.  3/11: Patient reports pain continues in her knees left worse than right anteriorly. Pain level 4/10 now, worse with walking and stairs, sharp. Better with rest. No skin changes.  3/19: Patient returns for first gelsyn injection Pain level 7/10 anterior knee. Have some medial numbness distal thigh, medial lower leg.   No associated low back pain, weakness, bowel/bladder dysfunction. No injuries since last visit.  3/30: Patient returns for her second gelsyn injection. Pain level 6/10 anterior knee.  4/6: Patient reports she's improved since last visit. Pain level down to 3/10 anteriorly. No skin changes, numbness.  Past Medical History:  Diagnosis Date  . Anxiety   . Arthritis   . Asthma   . Bowel obstruction ([place])   . Cardiac arrhythmia   . Depression    sees Dr. Matilde Haymaker in [place]   .  Diabetes mellitus    type 2   . GERD (gastroesophageal reflux disease)   . Hyperlipidemia   . Hypertension   . Hypothyroidism    sees Dr. Elyse Hsu  . Insomnia   . Migraine syndrome   . Morbid obesity ([place])   . 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)   . Neck pain   . Neuropathy associated with endocrine disorder ([place])   . Sleep apnea with use of continuous positive airway pressure (CPAP)    Sleep apnea is resolved due to weight loss. 08/2017  . 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 Stroke [facility]) 04/2016    Current Outpatient Medications on File Prior to Visit  Medication Sig Dispense Refill  . albuterol (PROAIR HFA) 108 (90 Base) MCG/ACT inhaler Inhale 2 puffs into the lungs every 4 (four) hours as needed for wheezing. 8.5 g 11  . ARIPiprazole (ABILIFY) 30 MG tablet Take 30 mg by mouth daily.    . budesonide-formoterol (SYMBICORT) 160-4.5 MCG/ACT inhaler Inhale 2 puffs into the lungs 2 (two) times daily. 1 Inhaler 11  . buPROPion (WELLBUTRIN SR) 150 MG 12 hr tablet Take 2 tablets in the morning and 1 tablet in the evening    . clopidogrel (PLAVIX) 75 MG tablet TAKE 1 TABLET(75 MG) BY MOUTH DAILY 90 tablet 0  . gabapentin (NEURONTIN) 300 MG capsule Take 1 capsule (300 mg total) by mouth 3 (three) times daily. 90 capsule 2  . hydrOXYzine (ATARAX/VISTARIL) 50 MG tablet Take 50 mg by mouth. Take 1 tablet every night and may take 3 other times  as needed    . insulin aspart (NOVOLOG) 100 UNIT/ML injection Use in the insulin pump as directed 10 mL 0  . insulin lispro (HUMALOG) 100 UNIT/ML injection ADM 40 UNI [place] D    . levothyroxine (SYNTHROID, LEVOTHROID) 125 MCG tablet Take 1 tablet (125 mcg total) by mouth daily before breakfast. 90 tablet 3  . metoprolol succinate (TOPROL-XL) 25 MG 24 hr tablet Take 0.5 tablets (12.5 mg total) by mouth daily. 30 tablet 0  . Multiple Vitamin (MULTIVITAMIN) tablet Take 1 tablet by mouth daily.    . norethindrone (MICRONOR,CAMILA,ERRIN) 0.35 MG tablet     . nystatin  (NYSTATIN) powder Apply topically 4 (four) times daily. 60 g 5  . pantoprazole (PROTONIX) 40 MG tablet Take 1 tablet (40 mg total) by mouth daily. 30 tablet 3  . prazosin (MINIPRESS) 5 MG capsule Take 10 mg by mouth at bedtime.    . promethazine (PHENERGAN) 25 MG tablet Take 1 tablet (25 mg total) every 6 (six) hours as needed by mouth for nausea or vomiting. 10 tablet 0  . rizatriptan (MAXALT) 10 MG tablet Take 1 tablet (10 mg total) by mouth as needed for migraine. May repeat in 2 hours if needed 30 tablet 5  . rosuvastatin (CRESTOR) 20 MG tablet TAKE ONE TABLET BY MOUTH DAILY 30 tablet 6  . tiZANidine (ZANAFLEX) 2 MG tablet TAKE ONE TABLET BY MOUTH EVERY 8 HOURS AS NEEDED FOR MUSCLE SPASMS 60 tablet 1  . topiramate (TOPAMAX) 100 MG tablet Take 100 mg by mouth 2 (two) times daily.     . traZODone (DESYREL) 150 MG tablet Take by mouth at bedtime.    . valACYclovir (VALTREX) 1000 MG tablet Take 1 tablet (1,000 mg total) by mouth 3 (three) times daily. (Patient taking differently: Take 1,000 mg by mouth 3 (three) times daily as needed. ) 21 tablet 5   No current facility-administered medications on file prior to visit.     Past Surgical History:  Procedure Laterality Date  . blocked itestinal repair  44   age 15 months  . BREAST BIOPSY Left 2017  . CHOLECYSTECTOMY  2011  . INDUCED ABORTION  1996   forced abortion  . INGUINAL HERNIA REPAIR Left 1980   age 22  . LAPAROSCOPIC ENDOMETRIOSIS FULGURATION  1998  . WISDOM TOOTH EXTRACTION  2007    Allergies  Allergen Reactions  . Macrobid [Nitrofurantoin] Hives  . Metformin And Related Other (See Comments)    Abdominal cramping   . Byetta 10 Mcg Pen [Exenatide] Hives  . Clindamycin/Lincomycin Nausea And Vomiting  . Geodon [Ziprasidone Hcl]   . Lipitor [Atorvastatin]     Per patient this causes cramps  . Nsaids     Upset stomach   . Penicillins Other (See Comments)    Has patient had a PCN reaction causing immediate rash,  facial/tongue/throat swelling, SOB or lightheadedness with hypotension: No Has patient had a PCN reaction causing severe rash involving mucus membranes or skin necrosis: No Has patient had a PCN reaction that required hospitalization No Has patient had a PCN reaction occurring within the last 10 years: No If all of the above answers are "NO", then may proceed with Cephalosporin use.   Marland Kitchen Avocado Diarrhea and Other (See Comments)    Severe cramping, sweats, and diarrhea in upper GI/stomach  . Tramadol Hcl Itching and Rash    Social History   Socioeconomic History  . Marital status: Divorced    Spouse name: Not on file  .  Number of children: 5  . Years of education: Not on file  . Highest education level: Not on file  Occupational History  . Occupation: Nurse, learning disability  Social Needs  . Financial resource strain: Not on file  . Food insecurity:    Worry: Not on file    Inability: Not on file  . Transportation needs:    Medical: Not on file    Non-medical: Not on file  Tobacco Use  . Smoking status: Former Smoker    Packs/day: 0.50    Years: 0.50    Pack years: 0.25    Types: Cigarettes    Start date: 02/24/2018  . Smokeless tobacco: Never Used  . Tobacco comment: quit 03/25/2016  Substance and Sexual Activity  . Alcohol use: No    Alcohol/week: 0.0 standard drinks  . Drug use: Yes    Types: Marijuana    Comment: 2/10 last marijuana use  . Sexual activity: Not on file  Lifestyle  . Physical activity:    Days per week: Not on file    Minutes per session: Not on file  . Stress: Not on file  Relationships  . Social connections:    Talks on phone: Not on file    Gets together: Not on file    Attends religious service: Not on file    Active member of club or organization: Not on file    Attends meetings of clubs or organizations: Not on file    Relationship status: Not on file  . Intimate partner violence:    Fear of current or ex partner: Not on file     Emotionally abused: Not on file    Physically abused: Not on file    Forced sexual activity: Not on file  Other Topics Concern  . Not on file  Social History Narrative  . Not on file    Family History  Adopted: Yes  Problem Relation Age of Onset  . Heart disease Unknown        on both sides of family  . Diabetes Unknown        on both sides of family    BP 100/68   Pulse 91   Temp 98.2 F (36.8 C) (Oral)   Ht 5\' 2"  (1.575 m)   Wt 200 lb (90.7 kg)   BMI 36.58 kg/m   Review of Systems: See HPI above.     Objective:  Physical Exam:  Gen: NAD, comfortable in exam room  Rest of exam not repeated today. Left knee: No gross deformity, ecchymoses, swelling. TTP medial > lateral joint lines. FROM with 5/5 strength flexion and extension. Negative ant/post drawers. Negative valgus/varus testing. Negative lachmans. Negative mcmurrays, apleys, patellar apprehension. NV intact distally.  Right knee: No gross deformity, ecchymoses, swelling. TTP medial > lateral joint lines. FROM with 5/5 strength flexion and extension. Negative ant/post drawers. Negative valgus/varus testing. Negative lachmans. Negative mcmurrays, apleys, patellar apprehension. NV intact distally.  Assessment & Plan:  1. Left knee pain - 2/2 severe medial arthritis.  Third gelsyn-3 injection given today.  Let us know how she's doing going forward - can repeat 4th injection if still bothering her.  After informed written consent timeout was performed, patient was seated on exam table. Left knee was prepped with alcohol swab and utilizing anteromedial approach, patient's left knee was injected intraarticularly with 40mL bupivicaine followed by gelsyn-3.  Patient tolerated the procedure well without immediate complications.

## 2019-02-06 ENCOUNTER — Ambulatory Visit (INDEPENDENT_AMBULATORY_CARE_PROVIDER_SITE_OTHER): Payer: BLUE CROSS/BLUE SHIELD | Admitting: Psychology

## 2019-02-06 DIAGNOSIS — F4481 Dissociative identity disorder: Secondary | ICD-10-CM | POA: Diagnosis not present

## 2019-02-06 DIAGNOSIS — F4312 Post-traumatic stress disorder, chronic: Secondary | ICD-10-CM

## 2019-02-11 ENCOUNTER — Ambulatory Visit (INDEPENDENT_AMBULATORY_CARE_PROVIDER_SITE_OTHER): Payer: BLUE CROSS/BLUE SHIELD | Admitting: Psychology

## 2019-02-11 DIAGNOSIS — F4481 Dissociative identity disorder: Secondary | ICD-10-CM | POA: Diagnosis not present

## 2019-02-11 DIAGNOSIS — F4312 Post-traumatic stress disorder, chronic: Secondary | ICD-10-CM

## 2019-02-18 ENCOUNTER — Ambulatory Visit (INDEPENDENT_AMBULATORY_CARE_PROVIDER_SITE_OTHER): Payer: BLUE CROSS/BLUE SHIELD | Admitting: Psychology

## 2019-02-18 DIAGNOSIS — F4481 Dissociative identity disorder: Secondary | ICD-10-CM | POA: Diagnosis not present

## 2019-02-18 DIAGNOSIS — F4312 Post-traumatic stress disorder, chronic: Secondary | ICD-10-CM | POA: Diagnosis not present

## 2019-02-20 ENCOUNTER — Ambulatory Visit (INDEPENDENT_AMBULATORY_CARE_PROVIDER_SITE_OTHER): Payer: BLUE CROSS/BLUE SHIELD | Admitting: Psychology

## 2019-02-20 DIAGNOSIS — F4481 Dissociative identity disorder: Secondary | ICD-10-CM | POA: Diagnosis not present

## 2019-02-20 DIAGNOSIS — F4312 Post-traumatic stress disorder, chronic: Secondary | ICD-10-CM

## 2019-02-25 ENCOUNTER — Ambulatory Visit (INDEPENDENT_AMBULATORY_CARE_PROVIDER_SITE_OTHER): Payer: BLUE CROSS/BLUE SHIELD | Admitting: Psychology

## 2019-02-25 DIAGNOSIS — Z9889 Other specified postprocedural states: Secondary | ICD-10-CM | POA: Diagnosis not present

## 2019-02-25 DIAGNOSIS — F4481 Dissociative identity disorder: Secondary | ICD-10-CM | POA: Diagnosis not present

## 2019-02-27 ENCOUNTER — Encounter: Payer: Self-pay | Admitting: Family Medicine

## 2019-02-27 ENCOUNTER — Ambulatory Visit (INDEPENDENT_AMBULATORY_CARE_PROVIDER_SITE_OTHER): Payer: BLUE CROSS/BLUE SHIELD | Admitting: Psychology

## 2019-02-27 ENCOUNTER — Ambulatory Visit (INDEPENDENT_AMBULATORY_CARE_PROVIDER_SITE_OTHER): Payer: BLUE CROSS/BLUE SHIELD | Admitting: Family Medicine

## 2019-02-27 ENCOUNTER — Ambulatory Visit: Payer: Self-pay | Admitting: Family Medicine

## 2019-02-27 ENCOUNTER — Other Ambulatory Visit: Payer: Self-pay

## 2019-02-27 DIAGNOSIS — F4481 Dissociative identity disorder: Secondary | ICD-10-CM

## 2019-02-27 DIAGNOSIS — R6889 Other general symptoms and signs: Secondary | ICD-10-CM

## 2019-02-27 NOTE — Telephone Encounter (Signed)
Pt. Reports she has had diarrhea 2-3 days. Vomited x 1 this morning, which woke her up. Denies any fever or respiratory symptoms other than a "smokers cough." Employer told her to stay home and call her PCP. Warm transfer to Tammy in the practice for a virtual visit.  Answer Assessment - Initial Assessment Questions 1. COVID-19 DIAGNOSIS: "Who made your Coronavirus (COVID-19) diagnosis?" "Was it confirmed by a positive lab test?" If not diagnosed by a HCP, ask "Are there lots of cases (community spread) where you live?" (See public health department website, if unsure)   * MAJOR community spread: high number of cases; numbers of cases are increasing; many people hospitalized.   * MINOR community spread: low number of cases; not increasing; few or no people hospitalized     No diagnosis 2. ONSET: "When did the COVID-19 symptoms start?"      Diarrhea 2-3 days ago 3. WORST SYMPTOM: "What is your worst symptom?" (e.g., cough, fever, shortness of breath, muscle aches)     Vomiting 4. COUGH: "How bad is the cough?"       Mild cough "but I smoke 5. FEVER: "Do you have a fever?" If so, ask: "What is your temperature, how was it measured, and when did it start?"     No 6. RESPIRATORY STATUS: "Describe your breathing?" (e.g., shortness of breath, wheezing, unable to speak)      No 7. BETTER-SAME-WORSE: "Are you getting better, staying the same or getting worse compared to yesterday?"  If getting worse, ask, "In what way?"     Same 8. HIGH RISK DISEASE: "Do you have any chronic medical problems?" (e.g., asthma, heart or lung disease, weak immune system, etc.)     Diabetic, Thyroid disease, CVA in the past 9. PREGNANCY: "Is there any chance you are pregnant?" "When was your last menstrual period?"     No 10. OTHER SYMPTOMS: "Do you have any other symptoms?"  (e.g., runny nose, headache, sore throat, loss of smell)       Tired  Protocols used: CORONAVIRUS (COVID-19) DIAGNOSED OR SUSPECTED-A-AH

## 2019-02-27 NOTE — Progress Notes (Signed)
Subjective:    Patient ID: Miranda [name], female    DOB: November 04, 1975, 43 y.o.   MRN: [id number]  HPI Virtual Visit via Video Note  I connected with the patient on 02/27/19 at  9:30 AM EDT by a video enabled telemedicine application and verified that I am speaking with the correct person using two identifiers.  Location patient: home Location provider:work or home office Persons participating in the virtual visit: patient, provider  I discussed the limitations of evaluation and management by telemedicine and the availability of in person appointments. The patient expressed understanding and agreed to proceed.   HPI: Here asking about possible Covid-19 symptoms. 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 Of course she is now quarantined at home. Then 2 days ago [name] began having diarrhea and a slight dry cough. Then this morning she woke up with nausea and vomiting. No abdominal pain or fever. No SOB. No body aches or headache. She is drinking plenty of fluids.    ROS: See pertinent positives and negatives per HPI.  Past Medical History:  Diagnosis Date  . Anxiety   . Arthritis   . Asthma   . Bowel obstruction ([place])   . Cardiac arrhythmia   . Depression    sees Dr. Matilde Haymaker in [place]   . Diabetes mellitus    type 2   . GERD (gastroesophageal reflux disease)   . Hyperlipidemia   . Hypertension   . Hypothyroidism    sees Dr. Elyse Hsu  . Insomnia   . Migraine syndrome   . Morbid obesity ([place])   . 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 Neck pain   . Neuropathy associated with endocrine disorder ([place])   . Sleep apnea with use of continuous positive airway pressure (CPAP)    Sleep apnea is resolved due to weight loss. 08/2017  . 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 Stroke [facility]) 04/2016    Past Surgical History:  Procedure Laterality Date  . blocked  itestinal repair  28   age 58 months  . BREAST BIOPSY Left 2017  . CHOLECYSTECTOMY  2011  . INDUCED ABORTION  1996   forced abortion  . INGUINAL HERNIA REPAIR Left 1980   age 37  . LAPAROSCOPIC ENDOMETRIOSIS FULGURATION  1998  . WISDOM TOOTH EXTRACTION  2007    Family History  Adopted: Yes  Problem Relation Age of Onset  . Heart disease Unknown        on both sides of family  . Diabetes Unknown        on both sides of family     Current Outpatient Medications:  .  albuterol (PROAIR HFA) 108 (90 Base) MCG/ACT inhaler, Inhale 2 puffs into the lungs every 4 (four) hours as needed for wheezing., Disp: 8.5 g, Rfl: 11 .  ARIPiprazole (ABILIFY) 30 MG tablet, Take 30 mg by mouth daily., Disp: , Rfl:  .  budesonide-formoterol (SYMBICORT) 160-4.5 MCG/ACT inhaler, Inhale 2 puffs into the lungs 2 (two) times daily., Disp: 1 Inhaler, Rfl: 11 .  buPROPion (WELLBUTRIN SR) 150 MG 12 hr tablet, Take 2 tablets in the morning and 1 tablet in the evening, Disp: , Rfl:  .  clopidogrel (PLAVIX) 75 MG tablet, TAKE 1 TABLET(75 MG) BY MOUTH DAILY, Disp: 90 tablet, Rfl: 0 .  gabapentin (NEURONTIN) 300 MG capsule, Take 1 capsule (300  mg total) by mouth 3 (three) times daily., Disp: 90 capsule, Rfl: 2 .  hydrOXYzine (ATARAX/VISTARIL) 50 MG tablet, Take 50 mg by mouth. Take 1 tablet every night and may take 3 other times as needed, Disp: , Rfl:  .  insulin aspart (NOVOLOG) 100 UNIT/ML injection, Use in the insulin pump as directed, Disp: 10 mL, Rfl: 0 .  insulin lispro (HUMALOG) 100 UNIT/ML injection, ADM 40 UNI [place] D, Disp: , Rfl:  .  levothyroxine (SYNTHROID, LEVOTHROID) 125 MCG tablet, Take 1 tablet (125 mcg total) by mouth daily before breakfast., Disp: 90 tablet, Rfl: 3 .  metoprolol succinate (TOPROL-XL) 25 MG 24 hr tablet, Take 0.5 tablets (12.5 mg total) by mouth daily., Disp: 30 tablet, Rfl: 0 .  Multiple Vitamin (MULTIVITAMIN) tablet, Take 1 tablet by mouth daily., Disp: , Rfl:  .  norethindrone  (MICRONOR,CAMILA,ERRIN) 0.35 MG tablet, , Disp: , Rfl:  .  nystatin (NYSTATIN) powder, Apply topically 4 (four) times daily., Disp: 60 g, Rfl: 5 .  pantoprazole (PROTONIX) 40 MG tablet, Take 1 tablet (40 mg total) by mouth daily., Disp: 30 tablet, Rfl: 3 .  prazosin (MINIPRESS) 5 MG capsule, Take 10 mg by mouth at bedtime., Disp: , Rfl:  .  promethazine (PHENERGAN) 25 MG tablet, Take 1 tablet (25 mg total) every 6 (six) hours as needed by mouth for nausea or vomiting., Disp: 10 tablet, Rfl: 0 .  rizatriptan (MAXALT) 10 MG tablet, Take 1 tablet (10 mg total) by mouth as needed for migraine. May repeat in 2 hours if needed, Disp: 30 tablet, Rfl: 5 .  rosuvastatin (CRESTOR) 20 MG tablet, TAKE ONE TABLET BY MOUTH DAILY, Disp: 30 tablet, Rfl: 6 .  tiZANidine (ZANAFLEX) 2 MG tablet, TAKE ONE TABLET BY MOUTH EVERY 8 HOURS AS NEEDED FOR MUSCLE SPASMS, Disp: 60 tablet, Rfl: 1 .  topiramate (TOPAMAX) 100 MG tablet, Take 100 mg by mouth 2 (two) times daily. , Disp: , Rfl:  .  traZODone (DESYREL) 150 MG tablet, Take by mouth at bedtime., Disp: , Rfl:  .  valACYclovir (VALTREX) 1000 MG tablet, Take 1 tablet (1,000 mg total) by mouth 3 (three) times daily. (Patient taking differently: Take 1,000 mg by mouth 3 (three) times daily as needed. ), Disp: 21 tablet, Rfl: 5  EXAM:  VITALS per patient if applicable:  GENERAL: alert, oriented, appears well and in no acute distress  HEENT: atraumatic, conjunttiva clear, no obvious abnormalities on inspection of external nose and ears  NECK: normal movements of the head and neck  LUNGS: on inspection no signs of respiratory distress, breathing rate appears normal, no obvious gross SOB, gasping or wheezing  CV: no obvious cyanosis  MS: moves all visible extremities without noticeable abnormality  PSYCH/NEURO: pleasant and cooperative, no obvious depression or anxiety, speech and thought processing grossly intact  ASSESSMENT AND PLAN: There is a good chance she  has an infection with the Covid-19 virus. I advised her to stay home from work and to quarantine herself until 3 days have passed from the complete resolution of all her symptoms. She has Promethazine to use for nausea prn. Recheck prn.  Alysia Penna, MD  Discussed the following assessment and plan:  No diagnosis found.     I discussed the assessment and treatment plan with the patient. The patient was provided an opportunity to ask questions and all were answered. The patient agreed with the plan and demonstrated an understanding of the instructions.   The patient was advised to call  back or seek an in-person evaluation if the symptoms worsen or if the condition fails to improve as anticipated.     Review of Systems     Objective:   Physical Exam        Assessment & Plan:

## 2019-02-28 DIAGNOSIS — R197 Diarrhea, unspecified: Secondary | ICD-10-CM | POA: Diagnosis not present

## 2019-02-28 DIAGNOSIS — J4530 Mild persistent asthma, uncomplicated: Secondary | ICD-10-CM | POA: Diagnosis not present

## 2019-02-28 DIAGNOSIS — R112 Nausea with vomiting, unspecified: Secondary | ICD-10-CM | POA: Diagnosis not present

## 2019-02-28 DIAGNOSIS — R05 Cough: Secondary | ICD-10-CM | POA: Diagnosis not present

## 2019-02-28 DIAGNOSIS — Z20828 Contact with and (suspected) exposure to other viral communicable diseases: Secondary | ICD-10-CM | POA: Diagnosis not present

## 2019-03-03 ENCOUNTER — Encounter: Payer: Self-pay | Admitting: Family Medicine

## 2019-03-03 ENCOUNTER — Ambulatory Visit (INDEPENDENT_AMBULATORY_CARE_PROVIDER_SITE_OTHER): Payer: BLUE CROSS/BLUE SHIELD | Admitting: Psychology

## 2019-03-03 DIAGNOSIS — F4312 Post-traumatic stress disorder, chronic: Secondary | ICD-10-CM | POA: Diagnosis not present

## 2019-03-03 DIAGNOSIS — F4481 Dissociative identity disorder: Secondary | ICD-10-CM | POA: Diagnosis not present

## 2019-03-04 MED ORDER — PROMETHAZINE HCL 25 MG PO TABS: 25.0000 mg | ORAL_TABLET | ORAL | 1 refills | Status: DC | PRN

## 2019-03-04 NOTE — Telephone Encounter (Signed)
Dr. Fry please advise. Thanks  

## 2019-03-04 NOTE — Telephone Encounter (Signed)
In addition to fluids and rest, she can use Phenergan 25 mg as needed for nausea, to take one every 4 hours prn. Call in #[phone] with one rf. Please write her a work excuse

## 2019-03-05 ENCOUNTER — Ambulatory Visit (INDEPENDENT_AMBULATORY_CARE_PROVIDER_SITE_OTHER): Payer: BLUE CROSS/BLUE SHIELD | Admitting: Psychology

## 2019-03-05 DIAGNOSIS — F4312 Post-traumatic stress disorder, chronic: Secondary | ICD-10-CM | POA: Diagnosis not present

## 2019-03-05 DIAGNOSIS — F4481 Dissociative identity disorder: Secondary | ICD-10-CM | POA: Diagnosis not present

## 2019-03-10 ENCOUNTER — Ambulatory Visit (INDEPENDENT_AMBULATORY_CARE_PROVIDER_SITE_OTHER): Payer: BLUE CROSS/BLUE SHIELD | Admitting: Psychology

## 2019-03-10 DIAGNOSIS — F4312 Post-traumatic stress disorder, chronic: Secondary | ICD-10-CM | POA: Diagnosis not present

## 2019-03-10 DIAGNOSIS — F4481 Dissociative identity disorder: Secondary | ICD-10-CM | POA: Diagnosis not present

## 2019-03-12 ENCOUNTER — Ambulatory Visit (INDEPENDENT_AMBULATORY_CARE_PROVIDER_SITE_OTHER): Payer: BLUE CROSS/BLUE SHIELD | Admitting: Psychology

## 2019-03-12 DIAGNOSIS — F4481 Dissociative identity disorder: Secondary | ICD-10-CM

## 2019-03-12 DIAGNOSIS — F4312 Post-traumatic stress disorder, chronic: Secondary | ICD-10-CM | POA: Diagnosis not present

## 2019-03-13 DIAGNOSIS — E039 Hypothyroidism, unspecified: Secondary | ICD-10-CM | POA: Diagnosis not present

## 2019-03-13 DIAGNOSIS — E785 Hyperlipidemia, unspecified: Secondary | ICD-10-CM | POA: Diagnosis not present

## 2019-03-13 DIAGNOSIS — E1165 Type 2 diabetes mellitus with hyperglycemia: Secondary | ICD-10-CM | POA: Diagnosis not present

## 2019-03-13 DIAGNOSIS — E118 Type 2 diabetes mellitus with unspecified complications: Secondary | ICD-10-CM | POA: Diagnosis not present

## 2019-03-14 ENCOUNTER — Ambulatory Visit: Payer: Self-pay | Admitting: *Deleted

## 2019-03-14 NOTE — Telephone Encounter (Signed)
Summary: UTI symptoms, seeking advice. Please advise   Pt is experiencing UTI symptoms and is seeking advice on what to do until she can schedule an appt when the office reopens.      Call to patient- patient is having UTI symptoms- patient prefers to [facility] Saturday visit and will go to [facility] if she gets worse over the evening. Reason for Disposition . Side (flank) or lower back pain present  Answer Assessment - Initial Assessment Questions 1. SYMPTOM: "What's the main symptom you're concerned about?" (e.g., frequency, incontinence)     Urgency- going small amounts, frequency 2. ONSET: "When did the  UTI symptoms  start?"     Late last night- today got worse 3. PAIN: "Is there any pain?" If so, ask: "How bad is it?" (Scale: 1-10; mild, moderate, severe)     Yes- at end of stream- 5 4. CAUSE: "What do you think is causing the symptoms?"     UTI 5. OTHER SYMPTOMS: "Do you have any other symptoms?" (e.g., fever, flank pain, blood in urine, pain with urination)     Pain with urination, some flank pain, some protein in urine at endocrine yesterday 6. PREGNANCY: "Is there any chance you are pregnant?" "When was your last menstrual period?"     No- LMP just finished cycle  Protocols used: URINARY [facility]

## 2019-03-15 ENCOUNTER — Ambulatory Visit (INDEPENDENT_AMBULATORY_CARE_PROVIDER_SITE_OTHER): Payer: BLUE CROSS/BLUE SHIELD | Admitting: Family Medicine

## 2019-03-15 ENCOUNTER — Other Ambulatory Visit: Payer: Self-pay

## 2019-03-15 ENCOUNTER — Encounter: Payer: Self-pay | Admitting: Family Medicine

## 2019-03-15 DIAGNOSIS — N3000 Acute cystitis without hematuria: Secondary | ICD-10-CM

## 2019-03-15 MED ORDER — SULFAMETHOXAZOLE-TRIMETHOPRIM 800-160 MG PO TABS: 1.0000 | ORAL_TABLET | Freq: Two times a day (BID) | ORAL | 0 refills | Status: AC

## 2019-03-15 NOTE — Progress Notes (Signed)
Virtual Visit via Video Note  I connected with Miranda [name] on 03/15/19 at 10:00 AM EDT by a video enabled telemedicine application and verified that I am speaking with the correct person using two identifiers.  Location patient: home Location provider:work or home office Persons participating in the virtual visit: patient, provider  I discussed the limitations of evaluation and management by telemedicine and the availability of in person appointments. The patient expressed understanding and agreed to proceed.   HPI: Increased urination started yesterday.  Now with dysuria more at the end of her stream, back pain, flank pain b/l.  Pt denies fever (temp 98.1), chills, N/V, or hematuria.  Got an at home Azo test kit- had leuks.  Drinking 1 large bottle of core water/day.  States often holds her urine when gets the urge to urinate.   ROS: See pertinent positives and negatives per HPI.  Past Medical History:  Diagnosis Date  . Anxiety   . Arthritis   . Asthma   . Bowel obstruction ([place])   . Cardiac arrhythmia   . Depression    sees Dr. Matilde Haymaker in [place]   . Diabetes mellitus    type 2   . GERD (gastroesophageal reflux disease)   . Hyperlipidemia   . Hypertension   . Hypothyroidism    sees Dr. Elyse Hsu  . Insomnia   . Migraine syndrome   . Morbid obesity ([place])   . 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 Neck pain   . Neuropathy associated with endocrine disorder ([place])   . Sleep apnea with use of continuous positive airway pressure (CPAP)    Sleep apnea is resolved due to weight loss. 08/2017  . 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 Stroke [facility]) 04/2016    Past Surgical History:  Procedure Laterality Date  . blocked itestinal repair  37   age 61 months  . BREAST BIOPSY Left 2017  . CHOLECYSTECTOMY  2011  . INDUCED ABORTION  1996   forced abortion  . INGUINAL HERNIA REPAIR Left 1980   age 86  . LAPAROSCOPIC ENDOMETRIOSIS FULGURATION  1998  . WISDOM TOOTH  EXTRACTION  2007    Family History  Adopted: Yes  Problem Relation Age of Onset  . Heart disease Unknown        on both sides of family  . Diabetes Unknown        on both sides of family    SOCIAL HX:    Current Outpatient Medications:  .  albuterol (PROAIR HFA) 108 (90 Base) MCG/ACT inhaler, Inhale 2 puffs into the lungs every 4 (four) hours as needed for wheezing., Disp: 8.5 g, Rfl: 11 .  ARIPiprazole (ABILIFY) 30 MG tablet, Take 30 mg by mouth daily., Disp: , Rfl:  .  budesonide-formoterol (SYMBICORT) 160-4.5 MCG/ACT inhaler, Inhale 2 puffs into the lungs 2 (two) times daily., Disp: 1 Inhaler, Rfl: 11 .  buPROPion (WELLBUTRIN SR) 150 MG 12 hr tablet, Take 2 tablets in the morning and 1 tablet in the evening, Disp: , Rfl:  .  clopidogrel (PLAVIX) 75 MG tablet, TAKE 1 TABLET(75 MG) BY MOUTH DAILY, Disp: 90 tablet, Rfl: 0 .  gabapentin (NEURONTIN) 300 MG capsule, Take 1 capsule (300 mg total) by mouth 3 (three) times daily., Disp: 90 capsule, Rfl: 2 .  hydrOXYzine (ATARAX/VISTARIL) 50 MG tablet, Take 50 mg by mouth. Take 1 tablet every night and may take 3 other times as needed, Disp: , Rfl:  .  insulin aspart (NOVOLOG) 100 UNIT/ML injection, Use in the insulin pump as directed, Disp: 10 mL, Rfl: 0 .  insulin lispro (HUMALOG) 100 UNIT/ML injection, ADM 40 UNI [place] D, Disp: , Rfl:  .  levothyroxine (SYNTHROID, LEVOTHROID) 125 MCG tablet, Take 1 tablet (125 mcg total) by mouth daily before breakfast., Disp: 90 tablet, Rfl: 3 .  metoprolol succinate (TOPROL-XL) 25 MG 24 hr tablet, Take 0.5 tablets (12.5 mg total) by mouth daily., Disp: 30 tablet, Rfl: 0 .  Multiple Vitamin (MULTIVITAMIN) tablet, Take 1 tablet by mouth daily., Disp: , Rfl:  .  norethindrone (MICRONOR,CAMILA,ERRIN) 0.35 MG tablet, , Disp: , Rfl:  .  nystatin (NYSTATIN) powder, Apply topically 4 (four) times daily., Disp: 60 g, Rfl: 5 .  pantoprazole (PROTONIX) 40 MG tablet, Take 1 tablet (40 mg total) by mouth daily., Disp: 30  tablet, Rfl: 3 .  prazosin (MINIPRESS) 5 MG capsule, Take 10 mg by mouth at bedtime., Disp: , Rfl:  .  promethazine (PHENERGAN) 25 MG tablet, Take 1 tablet (25 mg total) every 6 (six) hours as needed by mouth for nausea or vomiting., Disp: 10 tablet, Rfl: 0 .  promethazine (PHENERGAN) 25 MG tablet, Take 1 tablet (25 mg total) by mouth every 4 (four) hours as needed for nausea or vomiting., Disp: 60 tablet, Rfl: 1 .  rizatriptan (MAXALT) 10 MG tablet, Take 1 tablet (10 mg total) by mouth as needed for migraine. May repeat in 2 hours if needed, Disp: 30 tablet, Rfl: 5 .  rosuvastatin (CRESTOR) 20 MG tablet, TAKE ONE TABLET BY MOUTH DAILY, Disp: 30 tablet, Rfl: 6 .  tiZANidine (ZANAFLEX) 2 MG tablet, TAKE ONE TABLET BY MOUTH EVERY 8 HOURS AS NEEDED FOR MUSCLE SPASMS, Disp: 60 tablet, Rfl: 1 .  topiramate (TOPAMAX) 100 MG tablet, Take 100 mg by mouth 2 (two) times daily. , Disp: , Rfl:  .  traZODone (DESYREL) 150 MG tablet, Take by mouth at bedtime., Disp: , Rfl:  .  valACYclovir (VALTREX) 1000 MG tablet, Take 1 tablet (1,000 mg total) by mouth 3 (three) times daily. (Patient taking differently: Take 1,000 mg by mouth 3 (three) times daily as needed. ), Disp: 21 tablet, Rfl: 5  EXAM:  VITALS per patient if applicable:  T 81.1  GENERAL: alert, oriented, appears well and in no acute distress  HEENT: atraumatic, conjunctiva clear, no obvious abnormalities on inspection of external nose and ears  NECK: normal movements of the head and neck  LUNGS: on inspection no signs of respiratory distress, breathing rate appears normal, no obvious gross SOB, gasping or wheezing  CV: no obvious cyanosis  MS: moves all visible extremities without noticeable abnormality  PSYCH/NEURO: pleasant and cooperative, no obvious depression or anxiety, speech and thought processing grossly intact  ASSESSMENT AND PLAN:  Discussed the following assessment and plan:  Acute cystitis without hematuria  -suspect UTI  based on symptoms and leuks on at home test -discussed increasing po intake of water and fluids -extensive allergy list reviewed. - Plan: sulfamethoxazole-trimethoprim (BACTRIM DS) 800-160 MG tablet -given precautions -f/u prn with pcp   I discussed the assessment and treatment plan with the patient. The patient was provided an opportunity to ask questions and all were answered. The patient agreed with the plan and demonstrated an understanding of the instructions.   The patient was advised to call back or seek an in-person evaluation if the symptoms worsen or if the condition fails to improve as anticipated.   Billie Ruddy, MD

## 2019-03-17 ENCOUNTER — Ambulatory Visit (INDEPENDENT_AMBULATORY_CARE_PROVIDER_SITE_OTHER): Payer: BLUE CROSS/BLUE SHIELD | Admitting: Psychology

## 2019-03-17 ENCOUNTER — Other Ambulatory Visit: Payer: Self-pay | Admitting: Family Medicine

## 2019-03-17 DIAGNOSIS — F4481 Dissociative identity disorder: Secondary | ICD-10-CM

## 2019-03-17 DIAGNOSIS — F4312 Post-traumatic stress disorder, chronic: Secondary | ICD-10-CM

## 2019-03-19 ENCOUNTER — Other Ambulatory Visit: Payer: Self-pay

## 2019-03-19 ENCOUNTER — Ambulatory Visit: Payer: BLUE CROSS/BLUE SHIELD | Admitting: Psychology

## 2019-03-19 ENCOUNTER — Encounter: Payer: Self-pay | Admitting: Family Medicine

## 2019-03-19 ENCOUNTER — Ambulatory Visit (INDEPENDENT_AMBULATORY_CARE_PROVIDER_SITE_OTHER): Payer: BLUE CROSS/BLUE SHIELD | Admitting: Family Medicine

## 2019-03-19 VITALS — BP 111/68 | HR 90 | Ht 62.0 in | Wt 225.0 lb

## 2019-03-19 DIAGNOSIS — M1712 Unilateral primary osteoarthritis, left knee: Secondary | ICD-10-CM | POA: Diagnosis not present

## 2019-03-19 DIAGNOSIS — M545 Low back pain, unspecified: Secondary | ICD-10-CM

## 2019-03-19 NOTE — Progress Notes (Signed)
PCP: Laurey Morale, MD  Subjective:   HPI: Patient is a 43 y.o. female here for left knee pain and right sided back pain.  Patient reports continued left knee pain.  Today 3/10 pain she has recently completed 3 injections of Gelsyn.  She does feel that these have been helpful but the pain persists.  Overall she seems the pain is tolerable, however she complains of feeling of instability in her knees when they go out.  She states "I do not trust my knees".  She notes that her right knee is typically worse but her left knee is getting worse due do compensation.  She denies any swelling or erythema.  No distal numbness or tingling  She also notes having right-sided low back pain.  Localizes her pain to the upper lumbar/flank area.  She reports 7/10 pain.  Recently she was diagnosed with a UTI and she is currently on day 4/7 of treatment with Bactrim.  She reports resolution of her lower urinary tract symptoms.  She denies ever having any associated fever chills.  Her pain does not radiate.  No distal numbness or tingling lower extremities.  She is currently in physical therapy for multiple issues.  She had previously taken the prior month off due to illness but has resumed starting today.  They do work with her for back and knee issues.  She is already on tizanidine.  She also uses heat which she finds helpful.  Past Medical History:  Diagnosis Date  . Anxiety   . Arthritis   . Asthma   . Bowel obstruction ([place])   . Cardiac arrhythmia   . Depression    sees Dr. Matilde Haymaker in [place]   . Diabetes mellitus    type 2   . GERD (gastroesophageal reflux disease)   . Hyperlipidemia   . Hypertension   . Hypothyroidism    sees Dr. Elyse Hsu  . Insomnia   . Migraine syndrome   . Morbid obesity ([place])   . 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 Neck pain   . Neuropathy associated with endocrine disorder ([place])   . Sleep apnea with use of continuous positive airway pressure (CPAP)    Sleep apnea is  resolved due to weight loss. 08/2017  . 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 Stroke [facility]) 04/2016    Current Outpatient Medications on File Prior to Visit  Medication Sig Dispense Refill  . albuterol (PROAIR HFA) 108 (90 Base) MCG/ACT inhaler Inhale 2 puffs into the lungs every 4 (four) hours as needed for wheezing. 8.5 g 11  . ARIPiprazole (ABILIFY) 30 MG tablet Take 30 mg by mouth daily.    . budesonide-formoterol (SYMBICORT) 160-4.5 MCG/ACT inhaler Inhale 2 puffs into the lungs 2 (two) times daily. 1 Inhaler 11  . buPROPion (WELLBUTRIN SR) 150 MG 12 hr tablet Take 2 tablets in the morning and 1 tablet in the evening    . clopidogrel (PLAVIX) 75 MG tablet TAKE 1 TABLET(75 MG) BY MOUTH DAILY 90 tablet 0  . gabapentin (NEURONTIN) 300 MG capsule Take 1 capsule (300 mg total) by mouth 3 (three) times daily. 90 capsule 2  . hydrOXYzine (ATARAX/VISTARIL) 50 MG tablet Take 50 mg by mouth. Take 1 tablet every night and may take 3 other times as needed    . insulin aspart (NOVOLOG) 100 UNIT/ML injection Use in the insulin pump as directed 10 mL 0  . insulin lispro (HUMALOG) 100 UNIT/ML injection ADM 40 UNI [place]  D    . levothyroxine (SYNTHROID, LEVOTHROID) 125 MCG tablet Take 1 tablet (125 mcg total) by mouth daily before breakfast. 90 tablet 3  . metoprolol succinate (TOPROL-XL) 25 MG 24 hr tablet Take 0.5 tablets (12.5 mg total) by mouth daily. 30 tablet 0  . Multiple Vitamin (MULTIVITAMIN) tablet Take 1 tablet by mouth daily.    . norethindrone (MICRONOR,CAMILA,ERRIN) 0.35 MG tablet     . nystatin (NYSTATIN) powder Apply topically 4 (four) times daily. 60 g 5  . pantoprazole (PROTONIX) 40 MG tablet Take 1 tablet (40 mg total) by mouth daily. 30 tablet 3  . prazosin (MINIPRESS) 5 MG capsule Take 10 mg by mouth at bedtime.    . promethazine (PHENERGAN) 25 MG tablet Take 1 tablet (25 mg total) every 6 (six) hours as needed by mouth for nausea or vomiting. 10 tablet 0  . promethazine (PHENERGAN) 25 MG  tablet Take 1 tablet (25 mg total) by mouth every 4 (four) hours as needed for nausea or vomiting. 60 tablet 1  . rizatriptan (MAXALT) 10 MG tablet Take 1 tablet (10 mg total) by mouth as needed for migraine. May repeat in 2 hours if needed 30 tablet 5  . rosuvastatin (CRESTOR) 20 MG tablet TAKE ONE TABLET BY MOUTH DAILY 30 tablet 6  . sulfamethoxazole-trimethoprim (BACTRIM DS) 800-160 MG tablet Take 1 tablet by mouth 2 (two) times daily for 7 days. 14 tablet 0  . tiZANidine (ZANAFLEX) 2 MG tablet TAKE ONE TABLET BY MOUTH EVERY 8 HOURS AS NEEDED FOR MUSCLE SPASMS 60 tablet 2  . topiramate (TOPAMAX) 100 MG tablet Take 100 mg by mouth 2 (two) times daily.     . traZODone (DESYREL) 150 MG tablet Take by mouth at bedtime.    . valACYclovir (VALTREX) 1000 MG tablet Take 1 tablet (1,000 mg total) by mouth 3 (three) times daily. (Patient taking differently: Take 1,000 mg by mouth 3 (three) times daily as needed. ) 21 tablet 5   No current facility-administered medications on file prior to visit.     Past Surgical History:  Procedure Laterality Date  . blocked itestinal repair  42   age 4 months  . BREAST BIOPSY Left 2017  . CHOLECYSTECTOMY  2011  . INDUCED ABORTION  1996   forced abortion  . INGUINAL HERNIA REPAIR Left 1980   age 52  . LAPAROSCOPIC ENDOMETRIOSIS FULGURATION  1998  . WISDOM TOOTH EXTRACTION  2007    Allergies  Allergen Reactions  . Macrobid [Nitrofurantoin] Hives  . Metformin And Related Other (See Comments)    Abdominal cramping   . [place]etta 10 Mcg Pen [Exenatide] Hives  . Clindamycin/Lincomycin Nausea And Vomiting  . Geodon [Ziprasidone Hcl]   . Lipitor [Atorvastatin]     Per patient this causes cramps  . Nsaids     Upset stomach   . Penicillins Other (See Comments)    Has patient had a PCN reaction causing immediate rash, facial/tongue/throat swelling, SOB or lightheadedness with hypotension: No Has patient had a PCN reaction causing severe rash involving mucus  membranes or skin necrosis: No Has patient had a PCN reaction that required hospitalization No Has patient had a PCN reaction occurring within the last 10 years: No If all of the above answers are "NO", then may proceed with Cephalosporin use.   Marland Kitchen Avocado Diarrhea and Other (See Comments)    Severe cramping, sweats, and diarrhea in upper GI/stomach  . Tramadol Hcl Itching and Rash    Social History  Socioeconomic History  . Marital status: Divorced    Spouse name: Not on file  . Number of children: 5  . Years of education: Not on file  . Highest education level: Not on file  Occupational History  . Occupation: Nurse, learning disability  Social Needs  . Financial resource strain: Not on file  . Food insecurity:    Worry: Not on file    Inability: Not on file  . Transportation needs:    Medical: Not on file    Non-medical: Not on file  Tobacco Use  . Smoking status: Former Smoker    Packs/day: 0.50    Years: 0.50    Pack years: 0.25    Types: Cigarettes    Start date: 02/24/2018  . Smokeless tobacco: Never Used  . Tobacco comment: quit 03/25/2016  Substance and Sexual Activity  . Alcohol use: No    Alcohol/week: 0.0 standard drinks  . Drug use: Yes    Types: Marijuana    Comment: 2/10 last marijuana use  . Sexual activity: Not on file  Lifestyle  . Physical activity:    Days per week: Not on file    Minutes per session: Not on file  . Stress: Not on file  Relationships  . Social connections:    Talks on phone: Not on file    Gets together: Not on file    Attends religious service: Not on file    Active member of club or organization: Not on file    Attends meetings of clubs or organizations: Not on file    Relationship status: Not on file  . Intimate partner violence:    Fear of current or ex partner: Not on file    Emotionally abused: Not on file    Physically abused: Not on file    Forced sexual activity: Not on file  Other Topics Concern  . Not on  file  Social History Narrative  . Not on file    Family History  Adopted: Yes  Problem Relation Age of Onset  . Heart disease Unknown        on both sides of family  . Diabetes Unknown        on both sides of family    BP 111/68   Pulse 90   Ht 5\' 2"  (1.575 m)   Wt 225 lb (102.1 kg)   BMI 41.15 kg/m   Review of Systems: See HPI above.     Objective:  Physical Exam:  Gen: awake, alert, NAD, comfortable in exam room Pulm: breathing unlabored  Left knee: - Inspection: No gross deformity. No effusion. No erythema or bruising. Skin intact - Palpation: She is somewhat diffuse tenderness.  More so along the medial joint line - ROM: full active ROM with flexion and extension in knee - Strength: 5/5 strength - Neuro/vasc: NV intact - Special Tests: No collateral ligament stability.  Hips: normal, pain free passive ROM with IR/ER  Lumbar spine: - Inspection: no gross deformity or asymmetry, swelling or ecchymosis. No skin changes - Palpation: Diffuse tenderness over the right thoracolumbar paraspinal muscle area.  No CVA tenderness or tenderness to percussion - ROM: Normal range of motion - Strength: 5/5 strength of lower extremity in L4-S1 nerve root distributions b/l - Neuro: sensation intact in the L4-S1 nerve root distribution b/l - Special testing: Negative straight leg raise   Assessment & Plan:  1.  Left knee pain-secondary to arthritis.  Although pain is same issue, knee stability  seems to be the more bothersome aspect for her. - We will have her follow-up for the fourth injection of Gelsyn. - she will call [facility] to follow-up to discuss potential TKA.  2.  Back pain- secondary to muscle strain.  No evidence of neurologic involvement.  Patient has no systemic symptoms or physical exam findings concerning for pyelonephritis. - Continue muscle relaxer as previously prescribed - Continue heat - She will continue with physical therapy and ask that they  address this.

## 2019-03-19 NOTE — Patient Instructions (Addendum)
We will ask physical therapy to add your low back to the rehab. Continue with heat 15 minutes at a time 3-4 times a day. Tizanidine as needed for spasms. We will order the fourth gel shot and call you when it's in. Call [facility] to talk about your knee and surgical options. Follow up with me in 6 weeks otherwise.

## 2019-03-20 ENCOUNTER — Telehealth: Payer: Self-pay | Admitting: *Deleted

## 2019-03-20 ENCOUNTER — Ambulatory Visit (INDEPENDENT_AMBULATORY_CARE_PROVIDER_SITE_OTHER): Payer: BLUE CROSS/BLUE SHIELD | Admitting: Family Medicine

## 2019-03-20 ENCOUNTER — Encounter: Payer: Self-pay | Admitting: Family Medicine

## 2019-03-20 ENCOUNTER — Ambulatory Visit: Payer: Self-pay | Admitting: Family Medicine

## 2019-03-20 VITALS — BP 120/71 | HR 94

## 2019-03-20 DIAGNOSIS — R509 Fever, unspecified: Secondary | ICD-10-CM

## 2019-03-20 DIAGNOSIS — Z7189 Other specified counseling: Secondary | ICD-10-CM

## 2019-03-20 DIAGNOSIS — Z20822 Contact with and (suspected) exposure to covid-19: Secondary | ICD-10-CM

## 2019-03-20 DIAGNOSIS — R52 Pain, unspecified: Secondary | ICD-10-CM | POA: Diagnosis not present

## 2019-03-20 DIAGNOSIS — R6889 Other general symptoms and signs: Secondary | ICD-10-CM | POA: Diagnosis not present

## 2019-03-20 NOTE — Progress Notes (Signed)
Virtual Visit via Video Note  I connected with Miranda [name]  on 03/20/19 at  3:20 PM EDT by a video enabled telemedicine application and verified that I am speaking with the correct person using two identifiers.  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 -temp at home 100.7 and has developed chills, body aches and loss of appetite -no known COVID19 exposure or cases at work -on bactrim for a UTI and the symptoms have resolved reports has only 1-2 more days of abx and was feeling great until this hit today -denies: dysuria, NVD, cough, SOB, rash, tick bites, chest pain -has mild HA, but reports history of migraines and this is not anywhere near the severity -hx of asthma, anxiety, chronic pain, DM, Morbid obesity and more - see below -she wants COVID19 testing, but prefers to go to an outside testing site new her home - reports does not need order and went there 3-4 weeks ago when she and her roommates were not feeling well   ROS: See pertinent positives and negatives per HPI.  Past Medical History:  Diagnosis Date  . Anxiety   . Arthritis   . Asthma   . Bowel obstruction ([place])   . Cardiac arrhythmia   . Depression    sees Dr. Matilde Haymaker in [place]   . Diabetes mellitus    type 2   . GERD (gastroesophageal reflux disease)   . Hyperlipidemia   . Hypertension   . Hypothyroidism    sees Dr. Elyse Hsu  . Insomnia   . Migraine syndrome   . Morbid obesity ([place])   . 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 Neck pain   . Neuropathy associated with endocrine disorder ([place])   . Sleep apnea with use of continuous positive airway pressure (CPAP)    Sleep apnea is resolved due to weight loss. 08/2017  .  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 Stroke [facility]) 04/2016    Past Surgical History:  Procedure Laterality Date  . blocked itestinal repair  22   age 2 months  . BREAST BIOPSY Left 2017  . CHOLECYSTECTOMY  2011  . INDUCED ABORTION  1996   forced abortion  . INGUINAL HERNIA REPAIR Left 1980   age 60  . LAPAROSCOPIC ENDOMETRIOSIS FULGURATION  1998  . WISDOM TOOTH EXTRACTION  2007    Family History  Adopted: Yes  Problem Relation Age of Onset  . Heart disease Unknown        on both sides of family  . Diabetes Unknown        on both sides of family    SOCIAL HX: see hpi   Current Outpatient Medications:  .  albuterol (PROAIR HFA) 108 (90 Base) MCG/ACT inhaler, Inhale 2 puffs into the lungs every 4 (four) hours as needed for wheezing., Disp: 8.5 g, Rfl: 11 .  ARIPiprazole (ABILIFY) 30 MG tablet, Take 30 mg by mouth daily., Disp: , Rfl:  .  budesonide-formoterol (SYMBICORT) 160-4.5 MCG/ACT inhaler, Inhale 2 puffs into the lungs 2 (two) times daily., Disp: 1 Inhaler, Rfl: 11 .  buPROPion (WELLBUTRIN SR) 150 MG 12 hr tablet, Take 2 tablets in the morning and 1 tablet in  the evening, Disp: , Rfl:  .  clopidogrel (PLAVIX) 75 MG tablet, TAKE 1 TABLET(75 MG) BY MOUTH DAILY, Disp: 90 tablet, Rfl: 0 .  gabapentin (NEURONTIN) 300 MG capsule, Take 1 capsule (300 mg total) by mouth 3 (three) times daily., Disp: 90 capsule, Rfl: 2 .  hydrOXYzine (ATARAX/VISTARIL) 50 MG tablet, Take 50 mg by mouth. Take 1 tablet every night and may take 3 other times as needed, Disp: , Rfl:  .  insulin aspart (NOVOLOG) 100 UNIT/ML injection, Use in the insulin pump as directed, Disp: 10 mL, Rfl: 0 .  insulin lispro (HUMALOG) 100 UNIT/ML injection, ADM 40 UNI [place] D, Disp: , Rfl:  .  levothyroxine (SYNTHROID, LEVOTHROID) 125 MCG tablet, Take 1 tablet (125 mcg total) by mouth daily before breakfast., Disp: 90 tablet, Rfl: 3 .  metoprolol succinate (TOPROL-XL) 25 MG 24 hr tablet, Take 0.5 tablets (12.5 mg total) by  mouth daily., Disp: 30 tablet, Rfl: 0 .  Multiple Vitamin (MULTIVITAMIN) tablet, Take 1 tablet by mouth daily., Disp: , Rfl:  .  norethindrone (MICRONOR,CAMILA,ERRIN) 0.35 MG tablet, , Disp: , Rfl:  .  nystatin (NYSTATIN) powder, Apply topically 4 (four) times daily., Disp: 60 g, Rfl: 5 .  pantoprazole (PROTONIX) 40 MG tablet, Take 1 tablet (40 mg total) by mouth daily., Disp: 30 tablet, Rfl: 3 .  prazosin (MINIPRESS) 5 MG capsule, Take 10 mg by mouth at bedtime., Disp: , Rfl:  .  promethazine (PHENERGAN) 25 MG tablet, Take 1 tablet (25 mg total) every 6 (six) hours as needed by mouth for nausea or vomiting., Disp: 10 tablet, Rfl: 0 .  promethazine (PHENERGAN) 25 MG tablet, Take 1 tablet (25 mg total) by mouth every 4 (four) hours as needed for nausea or vomiting., Disp: 60 tablet, Rfl: 1 .  rizatriptan (MAXALT) 10 MG tablet, Take 1 tablet (10 mg total) by mouth as needed for migraine. May repeat in 2 hours if needed, Disp: 30 tablet, Rfl: 5 .  rosuvastatin (CRESTOR) 20 MG tablet, TAKE ONE TABLET BY MOUTH DAILY, Disp: 30 tablet, Rfl: 6 .  sulfamethoxazole-trimethoprim (BACTRIM DS) 800-160 MG tablet, Take 1 tablet by mouth 2 (two) times daily for 7 days., Disp: 14 tablet, Rfl: 0 .  tiZANidine (ZANAFLEX) 2 MG tablet, TAKE ONE TABLET BY MOUTH EVERY 8 HOURS AS NEEDED FOR MUSCLE SPASMS, Disp: 60 tablet, Rfl: 2 .  topiramate (TOPAMAX) 100 MG tablet, Take 100 mg by mouth 2 (two) times daily. , Disp: , Rfl:  .  traZODone (DESYREL) 150 MG tablet, Take by mouth at bedtime., Disp: , Rfl:  .  valACYclovir (VALTREX) 1000 MG tablet, Take 1 tablet (1,000 mg total) by mouth 3 (three) times daily. (Patient taking differently: Take 1,000 mg by mouth 3 (three) times daily as needed. ), Disp: 21 tablet, Rfl: 5  EXAM:  VITALS per patient if applicable:  GENERAL: alert, oriented, appears well and in no acute distress  HEENT: atraumatic, conjunttiva clear, no obvious abnormalities on inspection of external nose and  ears, appears to have moist mucus membraines  NECK: normal movements of the head and neck  LUNGS: on inspection no signs of respiratory distress, breathing rate appears normal, no obvious gross SOB, gasping or wheezing  CV: no obvious cyanosis  MS: moves all visible extremities without noticeable abnormality  PSYCH/NEURO: pleasant and cooperative, no obvious depression or anxiety, speech and thought processing grossly intact  ASSESSMENT AND PLAN:  More than 50% of over 25 minutes spent in total in caring for  this patient was spent face-to-face with the patient, counseling and/or coordinating care.   Discussed the following assessment and plan:  Fever, unspecified fever cause  Body aches  Suspected Covid-19 Virus Infection  Advice Given About Covid-19 Virus Infection  -we discussed possible serious and likely etiologies, workup and treatment, treatment risks and return precautions. Will need covid testing and she preferred to do on her own at local site. Discussed and advised CDC home isolation per cdc guidelines. Also discussed if negative and/or if worsening prior to getting test results needs to call ahead and seek inperson care to exclude other etiologies. -after this discussion, Ger[place]ine opted for COVID19 testing, home isolation per cdc recs, Tylenol per safe dosing recs as needed for symptoms, seek care per above -follow up advised with Dr. Sarajane Jews closely on Tuesday -of course, we advised La[place]onda  to call or notify a doctor immediately if symptoms worsen or persist or new concerns arise.   I discussed the assessment and treatment plan with the patient. The patient was provided an opportunity to ask questions and all were answered. The patient agreed with the plan and demonstrated an understanding of the instructions.   The patient was advised to call back or seek an in-person evaluation if the symptoms worsen or if the condition fails to improve as anticipated.   Follow up  instructions: Advised assistant Wendie Simmer to help patient arrange the following: -follow up with PCP or me Tuesday   Lucretia Kern, DO

## 2019-03-20 NOTE — Telephone Encounter (Signed)
Pt called in c/o fever that is climbing, body aches and chills.   She was sent home from work.  I warm transferred her call into Dr. Barbie Banner office to Mechele Claude to be scheduled for a virtual visit due to the COVID-19 pandemic.  I forwarded my notes to the office.   Reason for Disposition . [1] Fever > 100.0 F (37.8 C) AND [2] diabetes mellitus or weak immune system (e.g., HIV positive, cancer chemo, splenectomy, organ transplant, chronic steroids)  Answer Assessment - Initial Assessment Questions 1. TEMPERATURE: "What is the most recent temperature?"  "How was it measured?"      100.3  Just a few minutes ago.    This morning my temperature was 99.3 they sent me home from work.   2. ONSET: "When did the fever start?"      This morning it was there when I was checked at work. 3. SYMPTOMS: "Do you have any other symptoms besides the fever?"  (e.g., colds, headache, sore throat, earache, cough, rash, diarrhea, vomiting, abdominal pain)     Body aches, joints,chills,  No appetite.    4. CAUSE: If there are no symptoms, ask: "What do you think is causing the fever?"      I don't know 5. CONTACTS: "Does anyone else in the family have an infection?"     No exposures 6. TREATMENT: "What have you done so far to treat this fever?" (e.g., medications)     No 7. IMMUNOCOMPROMISE: "Do you have of the following: diabetes, HIV positive, splenectomy, cancer chemotherapy, chronic steroid treatment, transplant patient, etc."     Diabetes, asthma.   8. PREGNANCY: "Is there any chance you are pregnant?" "When was your last menstrual period?"     No 9. TRAVEL: "Have you traveled out of the country in the last month?" (e.g., travel history, exposures)     No travels  Protocols used: FEVER-A-AH

## 2019-03-20 NOTE — Telephone Encounter (Signed)
Pt scheduled with Dr Maudie Mercury today

## 2019-03-20 NOTE — Patient Instructions (Addendum)
Get the testing for [place]. Please let us know if you need help or orders to do through our testing site.  Self Isolate: -stay home, only leave home for medication care and call ahead prior to leaving -continue isolation for 10 days minimum or two negatvie COVID19 tests 24 hours apart PLUS no fever for 72 hours + improving symptoms for 3 days. -avoid contact with others in your home. Stay in a separate room and use a separate bathroom if able. Wear a mask if you must leave your room and make interactions with others as breif as possible and use social distancing as much as possible. -wash hands frequently  Seek care immediately, call 911 if you are having difficulty breathing or severe symptoms.  I hope you are feeling better soon! Seek care promptly (call ahead) if your symptoms worsen, new concerns arise or you are not improving as expected.   Follow up on Tuesday. Call early Tuesday morning if you do not hear from our office regarding an appointment time.

## 2019-03-20 NOTE — Telephone Encounter (Signed)
Pt scheduled for Covid-19 testing per Dr. Colin Benton. Appt secured for tomorrow 0830. Pt with fever, cough, chills. Testing process reviewed with patient; verbalizes understanding.

## 2019-03-21 ENCOUNTER — Other Ambulatory Visit: Payer: Self-pay

## 2019-03-21 ENCOUNTER — Ambulatory Visit: Payer: BLUE CROSS/BLUE SHIELD | Admitting: Orthopedic Surgery

## 2019-03-21 DIAGNOSIS — R6889 Other general symptoms and signs: Secondary | ICD-10-CM | POA: Diagnosis not present

## 2019-03-21 DIAGNOSIS — Z20822 Contact with and (suspected) exposure to covid-19: Secondary | ICD-10-CM

## 2019-03-21 DIAGNOSIS — R509 Fever, unspecified: Secondary | ICD-10-CM | POA: Diagnosis not present

## 2019-03-24 ENCOUNTER — Encounter (INDEPENDENT_AMBULATORY_CARE_PROVIDER_SITE_OTHER): Payer: Self-pay

## 2019-03-24 ENCOUNTER — Telehealth: Payer: Self-pay

## 2019-03-24 LAB — NOVEL CORONAVIRUS, NAA: SARS-CoV-2, NAA: NOT DETECTED

## 2019-03-24 NOTE — Telephone Encounter (Signed)
[place] Call  Patient reports Hx of IBS -  being constipated for about 3 days - took Senna on Saturday afternoon/evening; regular BM Sunday morning then about 2 loose BMs as the day went on. Patient reports 4-5 loose/diarrhea like stools beginning around 7 am this morning. Patient reports drinking fluids as well. Advise to continue drinking/sipping fluids to stay hydrated; monitor her stools; suggested OTC Imodium if her diarrhea should get worse. Patient stated she understood - felt it was the constipation but did not know how/where to not it on the questionnaire. Patient stated she had no questions/concerns at this time.

## 2019-03-25 ENCOUNTER — Ambulatory Visit (INDEPENDENT_AMBULATORY_CARE_PROVIDER_SITE_OTHER): Payer: BLUE CROSS/BLUE SHIELD | Admitting: Family Medicine

## 2019-03-25 ENCOUNTER — Other Ambulatory Visit: Payer: Self-pay

## 2019-03-25 DIAGNOSIS — R509 Fever, unspecified: Secondary | ICD-10-CM

## 2019-03-26 ENCOUNTER — Other Ambulatory Visit: Payer: Self-pay

## 2019-03-26 ENCOUNTER — Encounter (INDEPENDENT_AMBULATORY_CARE_PROVIDER_SITE_OTHER): Payer: Self-pay

## 2019-03-26 ENCOUNTER — Encounter: Payer: Self-pay | Admitting: Family Medicine

## 2019-03-26 ENCOUNTER — Other Ambulatory Visit: Payer: Self-pay | Admitting: Registered Nurse

## 2019-03-26 ENCOUNTER — Telehealth: Payer: Self-pay | Admitting: Hematology

## 2019-03-26 ENCOUNTER — Other Ambulatory Visit (INDEPENDENT_AMBULATORY_CARE_PROVIDER_SITE_OTHER): Payer: BLUE CROSS/BLUE SHIELD

## 2019-03-26 DIAGNOSIS — R509 Fever, unspecified: Secondary | ICD-10-CM | POA: Diagnosis not present

## 2019-03-26 DIAGNOSIS — R6889 Other general symptoms and signs: Secondary | ICD-10-CM | POA: Diagnosis not present

## 2019-03-26 DIAGNOSIS — Z20822 Contact with and (suspected) exposure to covid-19: Secondary | ICD-10-CM

## 2019-03-26 LAB — CBC WITH DIFFERENTIAL/PLATELET
Basophils Absolute: 0 10*3/uL (ref 0.0–0.1)
Basophils Relative: 0.2 % (ref 0.0–3.0)
Eosinophils Absolute: 0.2 10*3/uL (ref 0.0–0.7)
Eosinophils Relative: 3 % (ref 0.0–5.0)
HCT: 37.5 % (ref 36.0–46.0)
Hemoglobin: 13.3 g/dL (ref 12.0–15.0)
Lymphocytes Relative: 44.9 % (ref 12.0–46.0)
Lymphs Abs: 3 10*3/uL (ref 0.7–4.0)
MCHC: 35.4 g/dL (ref 30.0–36.0)
MCV: 88.5 fl (ref 78.0–100.0)
Monocytes Absolute: 0.5 10*3/uL (ref 0.1–1.0)
Monocytes Relative: 7.2 % (ref 3.0–12.0)
Neutro Abs: 3 10*3/uL (ref 1.4–7.7)
Neutrophils Relative %: 44.7 % (ref 43.0–77.0)
Platelets: 191 10*3/uL (ref 150.0–400.0)
RBC: 4.24 Mil/uL (ref 3.87–5.11)
RDW: 12.6 % (ref 11.5–15.5)
WBC: 6.7 10*3/uL (ref 4.0–10.5)

## 2019-03-26 NOTE — Telephone Encounter (Signed)
Dr. Sarajane Jews referred patient for COVID screen.  Spoke with patient and test is scheduled for today at 9:45.  Order placed.

## 2019-03-26 NOTE — Addendum Note (Signed)
Addended by: [place] on: 03/26/2019 10:44 AM   Modules accepted: Orders

## 2019-03-26 NOTE — Progress Notes (Signed)
Subjective:    Patient ID: Miranda [name], female    DOB: 05/19/1976, 43 y.o.   MRN: [id number]  HPI Virtual Visit via Video Note  I connected with the patient on 03/26/19 at  3:15 PM EDT by a video enabled telemedicine application and verified that I am speaking with the correct person using two identifiers.  Location patient: home Location provider:work or home office Persons participating in the virtual visit: patient, provider  I discussed the limitations of evaluation and management by telemedicine and the availability of in person appointments. The patient expressed understanding and agreed to proceed.   HPI: Here to follow up on fevers that started 6 days ago. On 03-15-19 she had a virtual visit with Dr. Volanda Napoleon for UTI symptoms (no fever) and she was given a course of Bactrim DS. After taking this for a few days the urinary symptoms resolved entirely. She did finish all 7 days of the Bactrim. 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 She felt fine that day, and she has had no other symptoms except fever ever since that day. She was sent home from work and later that day she had a virtual visit with Dr. Maudie Mercury. They agreed to test her for the Covid-19 virus, and this was done on 03-21-19. This came back negative. She was advised to quarantine at home for 7 days, and she has done so. She still feels fine but she has daily fevers, once as high as 102.6 degrees. She drinks fluids and takes Tylenol as needed. No else else at her group home has tested positive for the virus.    ROS: See pertinent positives and negatives per HPI.  Past Medical History:  Diagnosis Date  . Anxiety   . Arthritis   . Asthma   . Bowel obstruction ([place])   . Cardiac arrhythmia   . Depression    sees Dr. Matilde Haymaker in [place]   . Diabetes mellitus    type 2   . GERD (gastroesophageal reflux disease)   . Hyperlipidemia   .  Hypertension   . Hypothyroidism    sees Dr. Elyse Hsu  . Insomnia   . Migraine syndrome   . Morbid obesity ([place])   . 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 Neck pain   . Neuropathy associated with endocrine disorder ([place])   . Sleep apnea with use of continuous positive airway pressure (CPAP)    Sleep apnea is resolved due to weight loss. 08/2017  . 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 Stroke [facility]) 04/2016    Past Surgical History:  Procedure Laterality Date  . blocked itestinal repair  29   age 42 months  . BREAST BIOPSY Left 2017  . CHOLECYSTECTOMY  2011  . INDUCED ABORTION  1996   forced abortion  . INGUINAL HERNIA REPAIR Left 1980   age 45  . LAPAROSCOPIC ENDOMETRIOSIS FULGURATION  1998  . WISDOM TOOTH EXTRACTION  2007    Family History  Adopted: Yes  Problem Relation Age of Onset  . Heart disease Unknown        on both sides of family  . Diabetes Unknown        on both sides of family     Current Outpatient Medications:  .  albuterol (PROAIR HFA) 108 (90 Base) MCG/ACT inhaler, Inhale 2 puffs into the lungs every 4 (four) hours as needed  for wheezing., Disp: 8.5 g, Rfl: 11 .  ARIPiprazole (ABILIFY) 30 MG tablet, Take 30 mg by mouth daily., Disp: , Rfl:  .  budesonide-formoterol (SYMBICORT) 160-4.5 MCG/ACT inhaler, Inhale 2 puffs into the lungs 2 (two) times daily., Disp: 1 Inhaler, Rfl: 11 .  buPROPion (WELLBUTRIN SR) 150 MG 12 hr tablet, Take 2 tablets in the morning and 1 tablet in the evening, Disp: , Rfl:  .  clopidogrel (PLAVIX) 75 MG tablet, TAKE 1 TABLET(75 MG) BY MOUTH DAILY, Disp: 90 tablet, Rfl: 0 .  gabapentin (NEURONTIN) 300 MG capsule, Take 1 capsule (300 mg total) by mouth 3 (three) times daily., Disp: 90 capsule, Rfl: 2 .  hydrOXYzine (ATARAX/VISTARIL) 50 MG tablet, Take 50 mg by mouth. Take 1 tablet every night and may take 3 other times as needed, Disp: , Rfl:  .  insulin aspart (NOVOLOG) 100 UNIT/ML injection, Use in the insulin pump as directed, Disp: 10  mL, Rfl: 0 .  insulin lispro (HUMALOG) 100 UNIT/ML injection, ADM 40 UNI [place] D, Disp: , Rfl:  .  levothyroxine (SYNTHROID, LEVOTHROID) 125 MCG tablet, Take 1 tablet (125 mcg total) by mouth daily before breakfast., Disp: 90 tablet, Rfl: 3 .  metoprolol succinate (TOPROL-XL) 25 MG 24 hr tablet, Take 0.5 tablets (12.5 mg total) by mouth daily., Disp: 30 tablet, Rfl: 0 .  Multiple Vitamin (MULTIVITAMIN) tablet, Take 1 tablet by mouth daily., Disp: , Rfl:  .  norethindrone (MICRONOR,CAMILA,ERRIN) 0.35 MG tablet, , Disp: , Rfl:  .  nystatin (NYSTATIN) powder, Apply topically 4 (four) times daily., Disp: 60 g, Rfl: 5 .  pantoprazole (PROTONIX) 40 MG tablet, Take 1 tablet (40 mg total) by mouth daily., Disp: 30 tablet, Rfl: 3 .  prazosin (MINIPRESS) 5 MG capsule, Take 10 mg by mouth at bedtime., Disp: , Rfl:  .  promethazine (PHENERGAN) 25 MG tablet, Take 1 tablet (25 mg total) every 6 (six) hours as needed by mouth for nausea or vomiting., Disp: 10 tablet, Rfl: 0 .  promethazine (PHENERGAN) 25 MG tablet, Take 1 tablet (25 mg total) by mouth every 4 (four) hours as needed for nausea or vomiting., Disp: 60 tablet, Rfl: 1 .  rizatriptan (MAXALT) 10 MG tablet, Take 1 tablet (10 mg total) by mouth as needed for migraine. May repeat in 2 hours if needed, Disp: 30 tablet, Rfl: 5 .  rosuvastatin (CRESTOR) 20 MG tablet, TAKE ONE TABLET BY MOUTH DAILY, Disp: 30 tablet, Rfl: 6 .  tiZANidine (ZANAFLEX) 2 MG tablet, TAKE ONE TABLET BY MOUTH EVERY 8 HOURS AS NEEDED FOR MUSCLE SPASMS, Disp: 60 tablet, Rfl: 2 .  topiramate (TOPAMAX) 100 MG tablet, Take 100 mg by mouth 2 (two) times daily. , Disp: , Rfl:  .  traZODone (DESYREL) 150 MG tablet, Take by mouth at bedtime., Disp: , Rfl:  .  valACYclovir (VALTREX) 1000 MG tablet, Take 1 tablet (1,000 mg total) by mouth 3 (three) times daily. (Patient taking differently: Take 1,000 mg by mouth 3 (three) times daily as needed. ), Disp: 21 tablet, Rfl: 5  EXAM:  VITALS per  patient if applicable:  GENERAL: alert, oriented, appears well and in no acute distress  HEENT: atraumatic, conjunttiva clear, no obvious abnormalities on inspection of external nose and ears  NECK: normal movements of the head and neck  LUNGS: on inspection no signs of respiratory distress, breathing rate appears normal, no obvious gross SOB, gasping or wheezing  CV: no obvious cyanosis  MS: moves all visible extremities without noticeable  abnormality  PSYCH/NEURO: pleasant and cooperative, no obvious depression or anxiety, speech and thought processing grossly intact  ASSESSMENT AND PLAN: Persistent fevers for the past 6 days with no other symptoms. A possible etiology  Is a partially treated UTI, so we will have her come by the lab tomorrow for a UA and a urine culture. Other possibilities include a viral infection of some sort. Also tomorrow we will obtain a CBC, and we will get a repeat test for the Covid-19 virus as well as an IgG antibody test. She will remain on home quarantine for the time being.  Alysia Penna, MD   Discussed the following assessment and plan:  No diagnosis found.     I discussed the assessment and treatment plan with the patient. The patient was provided an opportunity to ask questions and all were answered. The patient agreed with the plan and demonstrated an understanding of the instructions.   The patient was advised to call back or seek an in-person evaluation if the symptoms worsen or if the condition fails to improve as anticipated.     Review of Systems     Objective:   Physical Exam        Assessment & Plan:

## 2019-03-27 ENCOUNTER — Encounter (INDEPENDENT_AMBULATORY_CARE_PROVIDER_SITE_OTHER): Payer: Self-pay

## 2019-03-27 LAB — SAR COV2 SEROLOGY (COVID19)AB(IGG),IA: SARS CoV2 AB IGG: NEGATIVE

## 2019-03-27 LAB — URINE CULTURE
MICRO NUMBER:: 510114
SPECIMEN QUALITY:: ADEQUATE

## 2019-03-27 LAB — EPSTEIN-BARR VIRUS VCA, IGG: EBV VCA IgG: 459 U/mL — ABNORMAL HIGH

## 2019-03-27 LAB — EPSTEIN-BARR VIRUS VCA, IGM: EBV VCA IgM: 105 U/mL — ABNORMAL HIGH

## 2019-03-28 ENCOUNTER — Telehealth: Payer: Self-pay | Admitting: *Deleted

## 2019-03-28 ENCOUNTER — Encounter (INDEPENDENT_AMBULATORY_CARE_PROVIDER_SITE_OTHER): Payer: Self-pay

## 2019-03-28 LAB — NOVEL CORONAVIRUS, NAA: SARS-CoV-2, NAA: NOT DETECTED

## 2019-03-28 NOTE — Telephone Encounter (Signed)
Copied from [place] [id number]. Topic: General - Other >> Mar 28, 2019 12:50 PM Leward Quan A wrote: Reason for CRM: Patient called to say that she need a note to go back to work saying that she had a negative COVID 19 test result asking for it to be uploaded to my chart.  Ph# [phone]  Dr. Sarajane Jews please advise. Thanks

## 2019-03-30 ENCOUNTER — Encounter (INDEPENDENT_AMBULATORY_CARE_PROVIDER_SITE_OTHER): Payer: Self-pay

## 2019-03-31 ENCOUNTER — Encounter (INDEPENDENT_AMBULATORY_CARE_PROVIDER_SITE_OTHER): Payer: Self-pay

## 2019-03-31 NOTE — Telephone Encounter (Signed)
The letter is ready  

## 2019-03-31 NOTE — Telephone Encounter (Signed)
She has tested positive for mononucleosis. Ask her if she is still having fevers. I'm not sure if she is ready to return to work or not

## 2019-03-31 NOTE — Telephone Encounter (Signed)
Pt called back in and she stated that she is not having any more fevers but she is having the severe fatigue/weakness.  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  Dr. Sarajane Jews please advise. Thanks

## 2019-03-31 NOTE — Telephone Encounter (Signed)
Called and spoke with pt and she is aware of letter to return to work from Dr. Sarajane Jews.

## 2019-04-03 ENCOUNTER — Ambulatory Visit (INDEPENDENT_AMBULATORY_CARE_PROVIDER_SITE_OTHER): Payer: BC Managed Care – PPO | Admitting: Psychology

## 2019-04-03 DIAGNOSIS — E039 Hypothyroidism, unspecified: Secondary | ICD-10-CM | POA: Diagnosis not present

## 2019-04-03 DIAGNOSIS — E118 Type 2 diabetes mellitus with unspecified complications: Secondary | ICD-10-CM | POA: Diagnosis not present

## 2019-04-03 DIAGNOSIS — E1165 Type 2 diabetes mellitus with hyperglycemia: Secondary | ICD-10-CM | POA: Diagnosis not present

## 2019-04-03 DIAGNOSIS — F4312 Post-traumatic stress disorder, chronic: Secondary | ICD-10-CM

## 2019-04-03 DIAGNOSIS — F4481 Dissociative identity disorder: Secondary | ICD-10-CM

## 2019-04-03 DIAGNOSIS — E785 Hyperlipidemia, unspecified: Secondary | ICD-10-CM | POA: Diagnosis not present

## 2019-04-07 ENCOUNTER — Ambulatory Visit (INDEPENDENT_AMBULATORY_CARE_PROVIDER_SITE_OTHER): Payer: BC Managed Care – PPO | Admitting: Psychology

## 2019-04-07 DIAGNOSIS — F4312 Post-traumatic stress disorder, chronic: Secondary | ICD-10-CM | POA: Diagnosis not present

## 2019-04-07 DIAGNOSIS — F4481 Dissociative identity disorder: Secondary | ICD-10-CM

## 2019-04-09 ENCOUNTER — Ambulatory Visit: Payer: Self-pay

## 2019-04-09 ENCOUNTER — Other Ambulatory Visit: Payer: Self-pay

## 2019-04-09 ENCOUNTER — Ambulatory Visit (INDEPENDENT_AMBULATORY_CARE_PROVIDER_SITE_OTHER): Payer: BC Managed Care – PPO | Admitting: Orthopedic Surgery

## 2019-04-09 ENCOUNTER — Encounter: Payer: Self-pay | Admitting: Orthopedic Surgery

## 2019-04-09 DIAGNOSIS — G8929 Other chronic pain: Secondary | ICD-10-CM

## 2019-04-09 DIAGNOSIS — M1712 Unilateral primary osteoarthritis, left knee: Secondary | ICD-10-CM

## 2019-04-09 DIAGNOSIS — M25561 Pain in right knee: Secondary | ICD-10-CM

## 2019-04-09 DIAGNOSIS — M1711 Unilateral primary osteoarthritis, right knee: Secondary | ICD-10-CM

## 2019-04-09 DIAGNOSIS — M25562 Pain in left knee: Secondary | ICD-10-CM

## 2019-04-09 NOTE — Progress Notes (Signed)
Office Visit Note   Patient: Miranda [name]           Date of Birth: 19-May-1976           MRN: [id number] Visit Date: 04/09/2019 Requested by: Laurey Morale, MD [place], [place] [address] PCP: Laurey Morale, MD  Subjective: No chief complaint on file.   HPI: Miranda [name] is a patient with bilateral knee pain left worse than right.  She has had previous diagnosis of knee arthritis.  I saw her about 10 years ago and she is done a lot since then.  She describes weakness giving way and significant pain globally in both knees but mostly but not exclusively on the medial and anterior aspect.  She takes Neurontin tizanidine and Norco for the pain.  She states that she does not really trust her knees.  She is a group Games developer.  She did have a stroke in 2017 and is on Plavix.  Last cortisone shot was January of this year.  She had a tooth pulled last year and no other imminent dental work required by her report.  She is in the midst of gel injections.              ROS: All systems reviewed are negative as they relate to the chief complaint within the history of present illness.  Patient denies  fevers or chills.   Assessment & Plan: Visit Diagnoses:  1. Chronic pain of both knees   2. Unilateral primary osteoarthritis, left knee   3. Unilateral primary osteoarthritis, right knee     Plan: Impression is end-stage knee arthritis in both knees.  Left is more symptomatic than the right.  Although this is primarily medial compartment arthritis radiographically she is having global pain on both the medial and lateral sides.  I do not think she is a ideal candidate for unicompartmental knee replacement based on her symptoms.  She is going to consider total knee replacement.  I discussed with her the risk and benefits of that including but limited to infection nerve vessel damage incomplete healing and incomplete pain relief.  Patient understands and is going to consider her options.   She will call me if she wants to schedule.  She does need to get her hemoglobin A1c 7.5 and below and she also needs to be close to BMI of 40.  Current height is 5 foot and 2 inches tall and 215 pounds.  Follow-Up Instructions: Return if symptoms worsen or fail to improve.   Orders:  Orders Placed This Encounter  Procedures  . XR Knee 1-2 Views Right  . XR Knee 1-2 Views Left   No orders of the defined types were placed in this encounter.     Procedures: No procedures performed   Clinical Data: No additional findings.  Objective: Vital Signs: There were no vitals taken for this visit.  Physical Exam:   Constitutional: Patient appears well-developed HEENT:  Head: Normocephalic Eyes:EOM are normal Neck: Normal range of motion Cardiovascular: Normal rate Pulmonary/chest: Effort normal Neurologic: Patient is alert Skin: Skin is warm Psychiatric: Patient has normal mood and affect    Ortho Exam: Ortho exam demonstrates near full extension and flexion past 90 in both knees.  Pedal pulses palpable.  She does not have much in terms of laxity to valgus stress at 0 and 30 degrees so I think that is also another strike against her in terms of unicompartmental knee replacement.  ACL intact  bilaterally.  Fairly minimal patellofemoral crepitus is present.  Specialty Comments:  No specialty comments available.  Imaging: Xr Knee 1-2 Views Left  Result Date: 04/09/2019 AP lateral left knee reviewed.  Severe medial compartment arthritis is present with relative sparing of the patellofemoral and lateral compartments.  No acute fracture or dislocation.  Bone quality looks good.  Xr Knee 1-2 Views Right  Result Date: 04/09/2019 AP lateral right knee reviewed.  Severe end-stage medial compartment arthritis is present with slight varus alignment.  Lateral and patellofemoral compartments appear spared relatively.  No acute fracture.  Bone quality looks good.    PMFS History: Patient  Active Problem List   Diagnosis Date Noted  . Type 2 diabetes mellitus with diabetic neuropathy, unspecified ([place]) 10/01/2018  . Right hip pain 05/22/2018  . De Quervain's disease (radial styloid tenosynovitis) 04/02/2018  . Carpal tunnel syndrome, right upper limb 12/31/2017  . Elbow injury, right, initial encounter 12/16/2017  . Chronic pain of both shoulders 11/30/2017  . PTSD (post-traumatic stress disorder) 10/25/2016  . 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 Diabetes mellitus ([place]) 10/25/2016  . Major depressive disorder, recurrent episode, severe, with psychosis ([place]) 10/20/2016  . Major depressive disorder, recurrent episode, severe, 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[place]) 10/19/2016  . TIA (transient ischemic attack) 05/16/2016  . Arterial ischemic stroke, MCA, left, acute ([place]) 03/29/2016  . Plantar fasciitis 08/08/2013  . Obesity, Class III, BMI 40-49.9 (morbid obesity) ([place]) 04/09/2013  . Unspecified sleep apnea 03/31/2013  . PCO (polycystic ovaries) 04/11/2012  . Neck pain 11/21/2010  . FATTY LIVER DISEASE 01/26/2010  . NAUSEA WITH VOMITING 01/26/2010  . PORTAL HYPERTENSION 01/26/2010  . Migraine headache 06/28/2009  . BREAST MASS 08/21/2008  . KNEE PAIN 04/14/2008  . Hypothyroidism 03/17/2008  . Hyperlipidemia 03/17/2008  . DEPRESSION 03/17/2008  . NEUROPATHY 03/17/2008  . Essential hypertension 03/17/2008  . ASTHMA 03/17/2008  . GERD 03/17/2008  . Headache 03/17/2008   Past Medical History:  Diagnosis Date  . Anxiety   . Arthritis   . Asthma   . Bowel obstruction ([place])   . Cardiac arrhythmia   . Depression    sees Dr. Matilde Haymaker in [place]   . Diabetes mellitus    type 2   . GERD (gastroesophageal reflux disease)   . Hyperlipidemia   . Hypertension   . Hypothyroidism    sees Dr. Elyse Hsu  . Insomnia   . Migraine syndrome   . Morbid obesity ([place])   . 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 Neck pain   . Neuropathy associated with endocrine disorder  ([place])   . Sleep apnea with use of continuous positive airway pressure (CPAP)    Sleep apnea is resolved due to weight loss. 08/2017  . 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 Stroke [facility]) 04/2016    Family History  Adopted: Yes  Problem Relation Age of Onset  . Heart disease Unknown        on both sides of family  . Diabetes Unknown        on both sides of family    Past Surgical History:  Procedure Laterality Date  . blocked itestinal repair  35   age 33 months  . BREAST BIOPSY Left 2017  . CHOLECYSTECTOMY  2011  . INDUCED ABORTION  1996   forced abortion  . INGUINAL HERNIA REPAIR Left 1980   age 16  . LAPAROSCOPIC ENDOMETRIOSIS FULGURATION  1998  . [place] EXTRACTION  2007  Social History   Occupational History  . Occupation: Nurse, learning disability  Tobacco Use  . Smoking status: Current Every Day Smoker    Packs/day: 0.50    Years: 0.50    Pack years: 0.25    Types: Cigarettes    Start date: 02/24/2018  . Smokeless tobacco: Never Used  . Tobacco comment: quit 03/25/2016  Substance and Sexual Activity  . Alcohol use: No    Alcohol/week: 0.0 standard drinks  . Drug use: Yes    Types: Marijuana    Comment: 2/10 last marijuana use  . Sexual activity: Not on file

## 2019-04-10 ENCOUNTER — Ambulatory Visit (INDEPENDENT_AMBULATORY_CARE_PROVIDER_SITE_OTHER): Payer: BC Managed Care – PPO | Admitting: Psychology

## 2019-04-10 DIAGNOSIS — F4481 Dissociative identity disorder: Secondary | ICD-10-CM

## 2019-04-14 ENCOUNTER — Ambulatory Visit: Payer: BC Managed Care – PPO | Admitting: Family Medicine

## 2019-04-14 ENCOUNTER — Other Ambulatory Visit: Payer: Self-pay | Admitting: Family Medicine

## 2019-04-14 ENCOUNTER — Encounter: Payer: Self-pay | Admitting: Family Medicine

## 2019-04-14 ENCOUNTER — Other Ambulatory Visit: Payer: Self-pay

## 2019-04-14 DIAGNOSIS — R293 Abnormal posture: Secondary | ICD-10-CM | POA: Diagnosis not present

## 2019-04-14 DIAGNOSIS — M545 Low back pain: Secondary | ICD-10-CM | POA: Diagnosis not present

## 2019-04-14 DIAGNOSIS — M542 Cervicalgia: Secondary | ICD-10-CM | POA: Diagnosis not present

## 2019-04-14 DIAGNOSIS — M1712 Unilateral primary osteoarthritis, left knee: Secondary | ICD-10-CM | POA: Diagnosis not present

## 2019-04-14 DIAGNOSIS — M546 Pain in thoracic spine: Secondary | ICD-10-CM | POA: Diagnosis not present

## 2019-04-14 NOTE — Progress Notes (Signed)
PCP: Laurey Morale, MD  Subjective:   HPI: Patient is a 43 y.o. female here for left knee pain  12/16: Patient denies known injury or trauma. She states on Saturday during the day she developed a stabbing pain between her scapulae more on the left side. Has tried heat/ice and robaxin without much benefit. Pain level 7/10, sharp. Constant dull ache in this area though. Worse with reaching. No numbness, skin changes.  10/31/18: Patient reports her pain in the upper back has spread from upper back down low back. Pain level 6/10, a tightness. Has been using topical medication, icing, tizanidine twice a day. Taking gabapentin three times a day. Her left knee has been hurting past 2 weeks - locking at times. Worsened after twisting this in the bed. No skin changes, numbness.  2/3: Patient returns with persistent worsening left knee pain. Pain anterior and radiates posteriorly. Pain level 8-9/10 and constantly achy. Kind of catching especially when on stairs. Patellar strap helps some. Insurance just going through and should have soon. No skin changes. No new injuries.  3/11: Patient reports pain continues in her knees left worse than right anteriorly. Pain level 4/10 now, worse with walking and stairs, sharp. Better with rest. No skin changes.  3/19: Patient returns for first gelsyn injection Pain level 7/10 anterior knee. Have some medial numbness distal thigh, medial lower leg.   No associated low back pain, weakness, bowel/bladder dysfunction. No injuries since last visit.  3/30: Patient returns for her second gelsyn injection. Pain level 6/10 anterior knee.  4/6: Patient reports she's improved since last visit. Pain level down to 3/10 anteriorly. No skin changes, numbness.  6/15: Patient returns for fourth gelsyn injection. She has seen Dr. Marlou Sa and reports she just has to find a time in her work schedule to have knee replaced. No skin changes.  Past Medical  History:  Diagnosis Date  . Anxiety   . Arthritis   . Asthma   . Bowel obstruction ([place])   . Cardiac arrhythmia   . Depression    sees Dr. Matilde Haymaker in [place]   . Diabetes mellitus    type 2   . GERD (gastroesophageal reflux disease)   . Hyperlipidemia   . Hypertension   . Hypothyroidism    sees Dr. Elyse Hsu  . Insomnia   . Migraine syndrome   . Morbid obesity ([place])   . 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 Neck pain   . Neuropathy associated with endocrine disorder ([place])   . Sleep apnea with use of continuous positive airway pressure (CPAP)    Sleep apnea is resolved due to weight loss. 08/2017  . 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 Stroke [facility]) 04/2016    Current Outpatient Medications on File Prior to Visit  Medication Sig Dispense Refill  . albuterol (PROAIR HFA) 108 (90 Base) MCG/ACT inhaler Inhale 2 puffs into the lungs every 4 (four) hours as needed for wheezing. 8.5 g 11  . ARIPiprazole (ABILIFY) 30 MG tablet Take 30 mg by mouth daily.    . budesonide-formoterol (SYMBICORT) 160-4.5 MCG/ACT inhaler Inhale 2 puffs into the lungs 2 (two) times daily. 1 Inhaler 11  . buPROPion (WELLBUTRIN XL) 150 MG 24 hr tablet     . clopidogrel (PLAVIX) 75 MG tablet TAKE 1 TABLET(75 MG) BY MOUTH DAILY 90 tablet 0  . gabapentin (NEURONTIN) 300 MG capsule Take 1 capsule (300 mg total) by mouth 3 (three) times daily. 90 capsule 2  .  hydrOXYzine (ATARAX/VISTARIL) 50 MG tablet Take 50 mg by mouth. Take 1 tablet every night and may take 3 other times as needed    . insulin aspart (NOVOLOG) 100 UNIT/ML injection Use in the insulin pump as directed 10 mL 0  . insulin lispro (HUMALOG) 100 UNIT/ML injection ADM 40 UNI [place]    . levothyroxine (SYNTHROID, LEVOTHROID) 125 MCG tablet Take 1 tablet (125 mcg total) by mouth daily before breakfast. 90 tablet 3  . metoprolol succinate (TOPROL-XL) 25 MG 24 hr tablet Take 0.5 tablets (12.5 mg total) by mouth daily. 30 tablet 0  . Multiple Vitamin  (MULTIVITAMIN) tablet Take 1 tablet by mouth daily.    . norethindrone (MICRONOR,CAMILA,ERRIN) 0.35 MG tablet     . nystatin (NYSTATIN) powder Apply topically 4 (four) times daily. 60 g 5  . pantoprazole (PROTONIX) 40 MG tablet Take 1 tablet (40 mg total) by mouth daily. 30 tablet 3  . prazosin (MINIPRESS) 5 MG capsule Take 10 mg by mouth at bedtime.    . promethazine (PHENERGAN) 25 MG tablet Take 1 tablet (25 mg total) every 6 (six) hours as needed by mouth for nausea or vomiting. 10 tablet 0  . promethazine (PHENERGAN) 25 MG tablet Take 1 tablet (25 mg total) by mouth every 4 (four) hours as needed for nausea or vomiting. 60 tablet 1  . rizatriptan (MAXALT) 10 MG tablet Take 1 tablet (10 mg total) by mouth as needed for migraine. May repeat in 2 hours if needed 30 tablet 5  . rosuvastatin (CRESTOR) 20 MG tablet TAKE ONE TABLET BY MOUTH DAILY 30 tablet 6  . tiZANidine (ZANAFLEX) 2 MG tablet TAKE ONE TABLET BY MOUTH EVERY 8 HOURS AS NEEDED FOR MUSCLE SPASMS 60 tablet 2  . topiramate (TOPAMAX) 100 MG tablet Take 100 mg by mouth 2 (two) times daily.     . traZODone (DESYREL) 150 MG tablet Take by mouth at bedtime.    . TRESIBA FLEXTOUCH 100 UNIT/ML SOPN FlexTouch Pen     . valACYclovir (VALTREX) 1000 MG tablet Take 1 tablet (1,000 mg total) by mouth 3 (three) times daily. (Patient taking differently: Take 1,000 mg by mouth 3 (three) times daily as needed. ) 21 tablet 5   No current facility-administered medications on file prior to visit.     Past Surgical History:  Procedure Laterality Date  . blocked itestinal repair  43   age 69 months  . BREAST BIOPSY Left 2017  . CHOLECYSTECTOMY  2011  . INDUCED ABORTION  1996   forced abortion  . INGUINAL HERNIA REPAIR Left 1980   age 70  . LAPAROSCOPIC ENDOMETRIOSIS FULGURATION  1998  . WISDOM TOOTH EXTRACTION  2007    Allergies  Allergen Reactions  . Macrobid [Nitrofurantoin] Hives  . Metformin And Related Other (See Comments)    Abdominal  cramping   . Byetta 10 Mcg Pen [Exenatide] Hives  . Clindamycin/Lincomycin Nausea And Vomiting  . Geodon [Ziprasidone Hcl]   . Lipitor [Atorvastatin]     Per patient this causes cramps  . Nsaids     Upset stomach   . Penicillins Other (See Comments)    Has patient had a PCN reaction causing immediate rash, facial/tongue/throat swelling, SOB or lightheadedness with hypotension: No Has patient had a PCN reaction causing severe rash involving mucus membranes or skin necrosis: No Has patient had a PCN reaction that required hospitalization No Has patient had a PCN reaction occurring within the last 10 years: No If all of  the above answers are "NO", then may proceed with Cephalosporin use.   Marland Kitchen Avocado Diarrhea and Other (See Comments)    Severe cramping, sweats, and diarrhea in upper GI/stomach  . Tramadol Hcl Itching and Rash    Social History   Socioeconomic History  . Marital status: Divorced    Spouse name: Not on file  . Number of children: 5  . Years of education: Not on file  . Highest education level: Not on file  Occupational History  . Occupation: Nurse, learning disability  Social Needs  . Financial resource strain: Not on file  . Food insecurity    Worry: Not on file    Inability: Not on file  . Transportation needs    Medical: Not on file    Non-medical: Not on file  Tobacco Use  . Smoking status: Current Every Day Smoker    Packs/day: 0.50    Years: 0.50    Pack years: 0.25    Types: Cigarettes    Start date: 02/24/2018  . Smokeless tobacco: Never Used  . Tobacco comment: quit 03/25/2016  Substance and Sexual Activity  . Alcohol use: No    Alcohol/week: 0.0 standard drinks  . Drug use: Yes    Types: Marijuana    Comment: 2/10 last marijuana use  . Sexual activity: Not on file  Lifestyle  . Physical activity    Days per week: Not on file    Minutes per session: Not on file  . Stress: Not on file  Relationships  . Social Herbalist on  phone: Not on file    Gets together: Not on file    Attends religious service: Not on file    Active member of club or organization: Not on file    Attends meetings of clubs or organizations: Not on file    Relationship status: Not on file  . Intimate partner violence    Fear of current or ex partner: Not on file    Emotionally abused: Not on file    Physically abused: Not on file    Forced sexual activity: Not on file  Other Topics Concern  . Not on file  Social History Narrative  . Not on file    Family History  Adopted: Yes  Problem Relation Age of Onset  . Heart disease Unknown        on both sides of family  . Diabetes Unknown        on both sides of family    BP 113/76   Pulse 76   Ht 5\' 2"  (1.575 m)   Wt 225 lb (102.1 kg)   BMI 41.15 kg/m   Review of Systems: See HPI above.     Objective:  Physical Exam:  Gen: NAD, comfortable in exam room  Rest of exam not repeated today.  Left knee: No gross deformity, ecchymoses, swelling. TTP medial > lateral joint lines. FROM with 5/5 strength flexion and extension. Negative ant/post drawers. Negative valgus/varus testing. Negative lachmans. Negative mcmurrays, apleys, patellar apprehension. NV intact distally.  Right knee: No gross deformity, ecchymoses, swelling. TTP medial > lateral joint lines. FROM with 5/5 strength flexion and extension. Negative ant/post drawers. Negative valgus/varus testing. Negative lachmans. Negative mcmurrays, apleys, patellar apprehension. NV intact distally.  Assessment & Plan:  1. Left knee pain - 2/2 severe medial arthritis.  Fourth gelsyn-3 injection given today.  F/u prn.  Will check with work schedule to find time to have knee replaced  by Dr. Marlou Sa.  She will have to get blood sugars under a little better control and ensure BMI < 40.  F/u with [place] prn for this issue.  After informed written consent timeout was performed, patient was seated on exam table. Left knee was prepped  with alcohol swab and utilizing anteromedial approach, patient's left knee was injected intraarticularly with 71mL bupivicaine followed by gelsyn-3. Patient tolerated the procedure well without immediate complications.

## 2019-04-16 ENCOUNTER — Ambulatory Visit (INDEPENDENT_AMBULATORY_CARE_PROVIDER_SITE_OTHER): Payer: BC Managed Care – PPO | Admitting: Psychology

## 2019-04-16 DIAGNOSIS — F4481 Dissociative identity disorder: Secondary | ICD-10-CM | POA: Diagnosis not present

## 2019-04-18 DIAGNOSIS — M542 Cervicalgia: Secondary | ICD-10-CM | POA: Diagnosis not present

## 2019-04-18 DIAGNOSIS — M545 Low back pain: Secondary | ICD-10-CM | POA: Diagnosis not present

## 2019-04-18 DIAGNOSIS — R293 Abnormal posture: Secondary | ICD-10-CM | POA: Diagnosis not present

## 2019-04-18 DIAGNOSIS — M546 Pain in thoracic spine: Secondary | ICD-10-CM | POA: Diagnosis not present

## 2019-04-21 DIAGNOSIS — M546 Pain in thoracic spine: Secondary | ICD-10-CM | POA: Diagnosis not present

## 2019-04-21 DIAGNOSIS — M545 Low back pain: Secondary | ICD-10-CM | POA: Diagnosis not present

## 2019-04-21 DIAGNOSIS — M542 Cervicalgia: Secondary | ICD-10-CM | POA: Diagnosis not present

## 2019-04-21 DIAGNOSIS — R293 Abnormal posture: Secondary | ICD-10-CM | POA: Diagnosis not present

## 2019-04-23 ENCOUNTER — Ambulatory Visit (INDEPENDENT_AMBULATORY_CARE_PROVIDER_SITE_OTHER): Payer: BC Managed Care – PPO | Admitting: Psychology

## 2019-04-23 DIAGNOSIS — F4481 Dissociative identity disorder: Secondary | ICD-10-CM | POA: Diagnosis not present

## 2019-04-23 DIAGNOSIS — F4312 Post-traumatic stress disorder, chronic: Secondary | ICD-10-CM

## 2019-04-24 DIAGNOSIS — R293 Abnormal posture: Secondary | ICD-10-CM | POA: Diagnosis not present

## 2019-04-24 DIAGNOSIS — M546 Pain in thoracic spine: Secondary | ICD-10-CM | POA: Diagnosis not present

## 2019-04-24 DIAGNOSIS — M542 Cervicalgia: Secondary | ICD-10-CM | POA: Diagnosis not present

## 2019-04-24 DIAGNOSIS — M545 Low back pain: Secondary | ICD-10-CM | POA: Diagnosis not present

## 2019-04-28 ENCOUNTER — Ambulatory Visit (INDEPENDENT_AMBULATORY_CARE_PROVIDER_SITE_OTHER): Payer: BC Managed Care – PPO | Admitting: Psychology

## 2019-04-28 ENCOUNTER — Ambulatory Visit: Payer: BLUE CROSS/BLUE SHIELD | Admitting: Family Medicine

## 2019-04-28 DIAGNOSIS — F4481 Dissociative identity disorder: Secondary | ICD-10-CM

## 2019-04-30 ENCOUNTER — Encounter: Payer: Self-pay | Admitting: Family Medicine

## 2019-04-30 DIAGNOSIS — M546 Pain in thoracic spine: Secondary | ICD-10-CM | POA: Diagnosis not present

## 2019-04-30 DIAGNOSIS — R293 Abnormal posture: Secondary | ICD-10-CM | POA: Diagnosis not present

## 2019-04-30 DIAGNOSIS — M545 Low back pain: Secondary | ICD-10-CM | POA: Diagnosis not present

## 2019-04-30 DIAGNOSIS — M542 Cervicalgia: Secondary | ICD-10-CM | POA: Diagnosis not present

## 2019-04-30 NOTE — Telephone Encounter (Signed)
Dr. Fry please advise. Thanks  

## 2019-05-01 NOTE — Telephone Encounter (Signed)
Tell her that, yes, people can have brief relapses of fatigue after having mono. This fades away over time

## 2019-05-06 DIAGNOSIS — M546 Pain in thoracic spine: Secondary | ICD-10-CM | POA: Diagnosis not present

## 2019-05-06 DIAGNOSIS — M545 Low back pain: Secondary | ICD-10-CM | POA: Diagnosis not present

## 2019-05-06 DIAGNOSIS — R293 Abnormal posture: Secondary | ICD-10-CM | POA: Diagnosis not present

## 2019-05-06 DIAGNOSIS — M542 Cervicalgia: Secondary | ICD-10-CM | POA: Diagnosis not present

## 2019-05-08 DIAGNOSIS — R293 Abnormal posture: Secondary | ICD-10-CM | POA: Diagnosis not present

## 2019-05-08 DIAGNOSIS — M546 Pain in thoracic spine: Secondary | ICD-10-CM | POA: Diagnosis not present

## 2019-05-08 DIAGNOSIS — M542 Cervicalgia: Secondary | ICD-10-CM | POA: Diagnosis not present

## 2019-05-08 DIAGNOSIS — M545 Low back pain: Secondary | ICD-10-CM | POA: Diagnosis not present

## 2019-05-09 ENCOUNTER — Other Ambulatory Visit: Payer: Self-pay | Admitting: Family Medicine

## 2019-05-12 ENCOUNTER — Ambulatory Visit (INDEPENDENT_AMBULATORY_CARE_PROVIDER_SITE_OTHER): Payer: BC Managed Care – PPO | Admitting: Psychology

## 2019-05-12 DIAGNOSIS — F4481 Dissociative identity disorder: Secondary | ICD-10-CM

## 2019-05-12 DIAGNOSIS — F4312 Post-traumatic stress disorder, chronic: Secondary | ICD-10-CM | POA: Diagnosis not present

## 2019-05-13 DIAGNOSIS — M545 Low back pain: Secondary | ICD-10-CM | POA: Diagnosis not present

## 2019-05-13 DIAGNOSIS — M546 Pain in thoracic spine: Secondary | ICD-10-CM | POA: Diagnosis not present

## 2019-05-13 DIAGNOSIS — R293 Abnormal posture: Secondary | ICD-10-CM | POA: Diagnosis not present

## 2019-05-13 DIAGNOSIS — M542 Cervicalgia: Secondary | ICD-10-CM | POA: Diagnosis not present

## 2019-05-15 DIAGNOSIS — M545 Low back pain: Secondary | ICD-10-CM | POA: Diagnosis not present

## 2019-05-15 DIAGNOSIS — R293 Abnormal posture: Secondary | ICD-10-CM | POA: Diagnosis not present

## 2019-05-15 DIAGNOSIS — M542 Cervicalgia: Secondary | ICD-10-CM | POA: Diagnosis not present

## 2019-05-15 DIAGNOSIS — M546 Pain in thoracic spine: Secondary | ICD-10-CM | POA: Diagnosis not present

## 2019-05-16 ENCOUNTER — Encounter: Payer: Self-pay | Admitting: Family Medicine

## 2019-05-16 ENCOUNTER — Ambulatory Visit (INDEPENDENT_AMBULATORY_CARE_PROVIDER_SITE_OTHER): Payer: BC Managed Care – PPO | Admitting: Family Medicine

## 2019-05-16 DIAGNOSIS — J069 Acute upper respiratory infection, unspecified: Secondary | ICD-10-CM | POA: Diagnosis not present

## 2019-05-16 DIAGNOSIS — Z0289 Encounter for other administrative examinations: Secondary | ICD-10-CM

## 2019-05-16 NOTE — Progress Notes (Signed)
Subjective:    Patient ID: Miranda [name], female    DOB: 1976/06/08, 43 y.o.   MRN: [id number]  HPI Virtual Visit via Video Note  I connected with the patient on 05/16/19 at  3:30 PM EDT by a video enabled telemedicine application and verified that I am speaking with the correct person using two identifiers.  Location patient: home Location provider:work or home office Persons participating in the virtual visit: patient, provider  I discussed the limitations of evaluation and management by telemedicine and the availability of in person appointments. The patient expressed understanding and agreed to proceed.   HPI: Here to ask about some symptoms. First about 2 weeks ago she developed some dizziness which comes and goes, and it makes her feel like the room is moving around her. This is more pronounced if she moves her head suddenly. She is alarmed because this is how she felt before she had her stroke in 2017. Then 2 days ago she developed sinus congestion, PND. And a dry cough. No fever or SOB or headache. No loss of taste or smell. No body aches or NVD. No other neurologic deficits.    ROS: See pertinent positives and negatives per HPI.  Past Medical History:  Diagnosis Date  . Anxiety   . Arthritis   . Asthma   . Bowel obstruction ([place])   . Cardiac arrhythmia   . Depression    sees Dr. Matilde Haymaker in [place]   . Diabetes mellitus    type 2   . GERD (gastroesophageal reflux disease)   . Hyperlipidemia   . Hypertension   . Hypothyroidism    sees Dr. Elyse Hsu  . Insomnia   . Migraine syndrome   . Morbid obesity ([place])   . 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 Neck pain   . Neuropathy associated with endocrine disorder ([place])   . Sleep apnea with use of continuous positive airway pressure (CPAP)    Sleep apnea is resolved due to weight loss. 08/2017  . 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 Stroke [facility] 04/2016    Past Surgical History:  Procedure Laterality Date  .  blocked itestinal repair  12   age 58 months  . BREAST BIOPSY Left 2017  . CHOLECYSTECTOMY  2011  . INDUCED ABORTION  1996   forced abortion  . INGUINAL HERNIA REPAIR Left 1980   age 49  . LAPAROSCOPIC ENDOMETRIOSIS FULGURATION  1998  . WISDOM TOOTH EXTRACTION  2007    Family History  Adopted: Yes  Problem Relation Age of Onset  . Heart disease Unknown        on both sides of family  . Diabetes Unknown        on both sides of family     Current Outpatient Medications:  .  albuterol (PROAIR HFA) 108 (90 Base) MCG/ACT inhaler, Inhale 2 puffs into the lungs every 4 (four) hours as needed for wheezing., Disp: 8.5 g, Rfl: 11 .  ARIPiprazole (ABILIFY) 30 MG tablet, Take 30 mg by mouth daily., Disp: , Rfl:  .  budesonide-formoterol (SYMBICORT) 160-4.5 MCG/ACT inhaler, Inhale 2 puffs into the lungs 2 (two) times daily., Disp: 1 Inhaler, Rfl: 11 .  buPROPion (WELLBUTRIN XL) 150 MG 24 hr tablet, , Disp: , Rfl:  .  clopidogrel (PLAVIX) 75 MG tablet, TAKE 1 TABLET(75 MG) BY MOUTH DAILY, Disp: 90 tablet, Rfl: 0 .  gabapentin (NEURONTIN) 300 MG capsule, TAKE ONE CAPSULE BY MOUTH THREE TIMES  A DAY, Disp: 90 capsule, Rfl: 1 .  hydrOXYzine (ATARAX/VISTARIL) 50 MG tablet, Take 50 mg by mouth. Take 1 tablet every night and may take 3 other times as needed, Disp: , Rfl:  .  insulin aspart (NOVOLOG) 100 UNIT/ML injection, Use in the insulin pump as directed, Disp: 10 mL, Rfl: 0 .  insulin lispro (HUMALOG) 100 UNIT/ML injection, ADM 40 UNI [place] D, Disp: , Rfl:  .  levothyroxine (SYNTHROID, LEVOTHROID) 125 MCG tablet, Take 1 tablet (125 mcg total) by mouth daily before breakfast., Disp: 90 tablet, Rfl: 3 .  metoprolol succinate (TOPROL-XL) 25 MG 24 hr tablet, Take 0.5 tablets (12.5 mg total) by mouth daily., Disp: 30 tablet, Rfl: 0 .  Multiple Vitamin (MULTIVITAMIN) tablet, Take 1 tablet by mouth daily., Disp: , Rfl:  .  norethindrone (MICRONOR,CAMILA,ERRIN) 0.35 MG tablet, , Disp: , Rfl:  .  nystatin  (NYSTATIN) powder, Apply topically 4 (four) times daily., Disp: 60 g, Rfl: 5 .  pantoprazole (PROTONIX) 40 MG tablet, TAKE ONE TABLET BY MOUTH DAILY, Disp: 30 tablet, Rfl: 2 .  prazosin (MINIPRESS) 5 MG capsule, Take 10 mg by mouth at bedtime., Disp: , Rfl:  .  promethazine (PHENERGAN) 25 MG tablet, Take 1 tablet (25 mg total) every 6 (six) hours as needed by mouth for nausea or vomiting., Disp: 10 tablet, Rfl: 0 .  promethazine (PHENERGAN) 25 MG tablet, Take 1 tablet (25 mg total) by mouth every 4 (four) hours as needed for nausea or vomiting., Disp: 60 tablet, Rfl: 1 .  rizatriptan (MAXALT) 10 MG tablet, Take 1 tablet (10 mg total) by mouth as needed for migraine. May repeat in 2 hours if needed, Disp: 30 tablet, Rfl: 5 .  rosuvastatin (CRESTOR) 20 MG tablet, TAKE ONE TABLET BY MOUTH DAILY, Disp: 30 tablet, Rfl: 6 .  tiZANidine (ZANAFLEX) 2 MG tablet, TAKE ONE TABLET BY MOUTH EVERY 8 HOURS AS NEEDED FOR MUSCLE SPASMS, Disp: 60 tablet, Rfl: 2 .  topiramate (TOPAMAX) 100 MG tablet, Take 100 mg by mouth 2 (two) times daily. , Disp: , Rfl:  .  traZODone (DESYREL) 150 MG tablet, Take by mouth at bedtime., Disp: , Rfl:  .  TRESIBA FLEXTOUCH 100 UNIT/ML SOPN FlexTouch Pen, , Disp: , Rfl:  .  valACYclovir (VALTREX) 1000 MG tablet, Take 1 tablet (1,000 mg total) by mouth 3 (three) times daily. (Patient taking differently: Take 1,000 mg by mouth 3 (three) times daily as needed. ), Disp: 21 tablet, Rfl: 5  EXAM:  VITALS per patient if applicable:  GENERAL: alert, oriented, appears well and in no acute distress  HEENT: atraumatic, conjunttiva clear, no obvious abnormalities on inspection of external nose and ears  NECK: normal movements of the head and neck  LUNGS: on inspection no signs of respiratory distress, breathing rate appears normal, no obvious gross SOB, gasping or wheezing  CV: no obvious cyanosis  MS: moves all visible extremities without noticeable abnormality  PSYCH/NEURO: pleasant  and cooperative, no obvious depression or anxiety, speech and thought processing grossly intact  ASSESSMENT AND PLAN: She seems to have some sinus congestion which is causing some vertigo. She is off this weekend to rest. She will drink fluids and take Mucinex and Flonase. Recheck if anything changes.  Alysia Penna, MD  Discussed the following assessment and plan:  No diagnosis found.     I discussed the assessment and treatment plan with the patient. The patient was provided an opportunity to ask questions and all were answered. The patient  agreed with the plan and demonstrated an understanding of the instructions.   The patient was advised to call back or seek an in-person evaluation if the symptoms worsen or if the condition fails to improve as anticipated.     Review of Systems     Objective:   Physical Exam        Assessment & Plan:

## 2019-05-20 ENCOUNTER — Ambulatory Visit (HOSPITAL_BASED_OUTPATIENT_CLINIC_OR_DEPARTMENT_OTHER)
Admission: RE | Admit: 2019-05-20 | Discharge: 2019-05-20 | Disposition: A | Discharge status: Home / Self Care | Payer: BC Managed Care – PPO | Source: Ambulatory Visit | Attending: Family Medicine | Admitting: Family Medicine

## 2019-05-20 ENCOUNTER — Ambulatory Visit: Payer: Self-pay

## 2019-05-20 ENCOUNTER — Other Ambulatory Visit: Payer: Self-pay

## 2019-05-20 ENCOUNTER — Encounter: Payer: Self-pay | Admitting: Family Medicine

## 2019-05-20 ENCOUNTER — Ambulatory Visit: Payer: BC Managed Care – PPO | Admitting: Family Medicine

## 2019-05-20 VITALS — BP 121/86 | HR 80 | Ht 62.0 in | Wt 225.0 lb

## 2019-05-20 DIAGNOSIS — M79641 Pain in right hand: Secondary | ICD-10-CM

## 2019-05-20 DIAGNOSIS — M542 Cervicalgia: Secondary | ICD-10-CM | POA: Diagnosis not present

## 2019-05-20 DIAGNOSIS — M25552 Pain in left hip: Secondary | ICD-10-CM

## 2019-05-20 DIAGNOSIS — M545 Low back pain: Secondary | ICD-10-CM | POA: Diagnosis not present

## 2019-05-20 DIAGNOSIS — R293 Abnormal posture: Secondary | ICD-10-CM | POA: Diagnosis not present

## 2019-05-20 DIAGNOSIS — M546 Pain in thoracic spine: Secondary | ICD-10-CM | POA: Diagnosis not present

## 2019-05-20 NOTE — Patient Instructions (Signed)
Nice to meet you Please try the duexis. Take it for 5 days straight and then as needed  Please try avoiding movements of the hand that cause pain.  Please continue the physical therapy for the hip. If it still is causing pain we can try an injection   Please send me a message in MyChart with any questions or updates.  Please see me back in 4 weeks.   --Dr. Raeford Razor

## 2019-05-20 NOTE — Progress Notes (Signed)
Miranda [name] - 43 y.o. female MRN [id number]  Date of birth: 1976/09/15  SUBJECTIVE:  Including CC & ROS.  Chief Complaint  Patient presents with   Hip Pain    left hip x 2 weeks   Hand Pain    right hand x 1 week    Miranda [name] is a 43 y.o. female that is presenting with right hand pain and left hip pain.  The hand pain is been ongoing for 1 week.  She fell and landed on her right hand.  The pain is occurring over the palmar and dorsal aspect.  It radiates from her wrist dorsally.  It is intermittent in nature.  It feels like a squeezing sensation.  It is moderate to severe in nature.  It is worse when she moves her hand and specific gripping motions.  Denies any numbness or tingling.  The left hip pain is occurring in the groin.  Is been ongoing for 2 weeks.  They have been working on this and physical therapy.  She is been getting physical therapy for her knee pain.  This pain is mild to moderate in nature.  She feels it worse with certain movements.  It does not radiate distally.  She is unsure if she had the pain prior to the fall.  Seems to be staying the same.  No fevers or chills.  Independent review of the AP pelvis from 12/13/2017 shows no significant abnormalities of the left hip.   Review of Systems  Constitutional: Negative for fever.  HENT: Negative for congestion.   Respiratory: Negative for cough.   Cardiovascular: Negative for chest pain.  Gastrointestinal: Negative for abdominal pain.  Musculoskeletal: Negative for gait problem.  Skin: Negative for color change.  Neurological: Positive for weakness.  Hematological: Negative for adenopathy.    HISTORY: Past Medical, Surgical, Social, and Family History Reviewed & Updated per EMR.   Pertinent Historical Findings include:  Past Medical History:  Diagnosis Date   Anxiety    Arthritis    Asthma    Bowel obstruction ([place])    Cardiac arrhythmia    Depression    sees Dr. Matilde Haymaker in [place]     Diabetes mellitus    type 2    GERD (gastroesophageal reflux disease)    Hyperlipidemia    Hypertension    Hypothyroidism    sees Dr. 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    Stroke ([place]) 04/2016    Past Surgical History:  Procedure Laterality Date   blocked itestinal repair  1975   age 75 months   BREAST BIOPSY Left 2017   CHOLECYSTECTOMY  2011   INDUCED ABORTION  1996   forced abortion   INGUINAL HERNIA REPAIR Left 1980   age 14   LAPAROSCOPIC ENDOMETRIOSIS FULGURATION  1998   WISDOM TOOTH EXTRACTION  2007    Allergies  Allergen Reactions   Macrobid [Nitrofurantoin] Hives   Metformin And Related Other (See Comments)    Abdominal cramping    [place] 10 Mcg Pen [Exenatide] Hives   Clindamycin/Lincomycin Nausea And Vomiting   Geodon [Ziprasidone Hcl]  Lipitor [Atorvastatin]     Per patient this causes cramps   Nsaids     Upset stomach    Penicillins Other (See Comments)    Has patient had a PCN reaction causing immediate rash, facial/tongue/throat swelling, SOB or lightheadedness with hypotension: No Has patient had a PCN reaction causing severe rash involving mucus membranes or skin necrosis: No Has patient had a PCN reaction that required hospitalization No Has patient had a PCN reaction occurring within the last 10 years: No If all of the above answers are "NO", then may proceed with Cephalosporin use.    Avocado Diarrhea and Other (See Comments)    Severe cramping, sweats, and diarrhea in upper GI/stomach   Tramadol Hcl Itching and Rash    Family History  Adopted: Yes  Problem Relation Age of Onset   Heart disease Other        on both  sides of family   Diabetes Other        on both sides of family     Social History   Socioeconomic History   Marital status: Divorced    Spouse name: Not on file   Number of children: 5   Years of education: Not on file   Highest education level: Not on file  Occupational History   Occupation: Nurse, learning disability  Social Needs   Emergency planning/management officer strain: Not on file   Food insecurity    Worry: Not on file    Inability: Not on file   Transportation needs    Medical: Not on file    Non-medical: Not on file  Tobacco Use   Smoking status: Current Every Day Smoker    Packs/day: 0.50    Years: 0.50    Pack years: 0.25    Types: Cigarettes    Start date: 02/24/2018   Smokeless tobacco: Never Used   Tobacco comment: quit 03/25/2016  Substance and Sexual Activity   Alcohol use: No    Alcohol/week: 0.0 standard drinks   Drug use: Yes    Types: Marijuana    Comment: 2/10 last marijuana use   Sexual activity: Not on file  Lifestyle   Physical activity    Days per week: Not on file    Minutes per session: Not on file   Stress: Not on file  Relationships   Social connections    Talks on phone: Not on file    Gets together: Not on file    Attends religious service: Not on file    Active member of club or organization: Not on file    Attends meetings of clubs or organizations: Not on file    Relationship status: Not on file   Intimate partner violence    Fear of current or ex partner: Not on file    Emotionally abused: Not on file    Physically abused: Not on file    Forced sexual activity: Not on file  Other Topics Concern   Not on file  Social History Narrative   Not on file     PHYSICAL EXAM:  VS: BP 121/86    Pulse 80    Ht 5\' 2"  (1.575 m)    Wt 225 lb (102.1 kg)    LMP 05/09/2019    BMI 41.15 kg/m  Physical Exam Gen: NAD, alert, cooperative with exam, well-appearing ENT: normal lips, normal nasal mucosa,  Eye: normal EOM,  normal conjunctiva and lids CV:  no edema, +2 pedal pulses   Resp: no  accessory muscle use, non-labored,  Skin: no rashes, no areas of induration  Neuro: normal tone, normal sensation to touch Psych:  normal insight, alert and oriented MSK:  Right hand: No specific area of tenderness. No snuffbox tenderness. No abnormal redness or swelling. Normal wrist range of motion. Normal finger extension and flexion. Normal strength resistance with finger abduction and abduction. Negative Finkelstein's test. Negative clunk. Left hip. Some tenderness to palpation over the greater trochanter. Normal internal and external rotation of the hip. Normal strength resistance with hip flexion. Normal strength resistance with knee flexion extension, plantarflexion and dorsiflexion. Neurovascular intact  Limited ultrasound: Right hand:  No significant synovitis occurring at the second or third metacarpal joints. No changes of the metacarpal themselves. Only mild degenerative change of the [facility] joint. No tenosynovitis of the wrist dorsal compartment Median nerve is mildly enlarged.  Summary: No specific finding as a source of her pain  Ultrasound and interpretation by Clearance Coots, MD      ASSESSMENT & PLAN:   Hand pain, right Having palmar and dorsal pain.  No specific pathology associated with ultrasound observed.  Could be related to the trauma of the fall.  She is already on gabapentin. -X-ray. -Provided samples of Duexis. -Counseled on home exercise therapy and supportive care. -If no improvement consider bracing  Left hip pain Pain seems to be more joint related as it is occurring in the groin.  Has good movement on exam and previous x-ray showed no significant degenerative changes.  Possible for labral problem or hip flexor -Counseled on home exercise therapy and supportive care. -If no improvement consider ultrasound, injection or physical therapy.

## 2019-05-20 NOTE — Progress Notes (Signed)
Medication Samples have been provided to the patient.  Drug name: Duexis      Strength: 800mg /26.6mg        Qty: 2 Boxes  LOT: [id number]  Exp.Date: 08/2020  Dosing instructions: Take 1 tablet by mouth three (3) times a day.  The patient has been instructed regarding the correct time, dose, and frequency of taking this medication, including desired effects and most common side effects.   Sherrie George, [place] 4:39 PM 05/20/2019

## 2019-05-21 DIAGNOSIS — S73199A Other sprain of unspecified hip, initial encounter: Secondary | ICD-10-CM | POA: Insufficient documentation

## 2019-05-21 DIAGNOSIS — M79641 Pain in right hand: Secondary | ICD-10-CM | POA: Insufficient documentation

## 2019-05-21 DIAGNOSIS — F449 Dissociative and conversion disorder, unspecified: Secondary | ICD-10-CM | POA: Diagnosis not present

## 2019-05-21 DIAGNOSIS — F4312 Post-traumatic stress disorder, chronic: Secondary | ICD-10-CM | POA: Diagnosis not present

## 2019-05-21 NOTE — Assessment & Plan Note (Signed)
Having palmar and dorsal pain.  No specific pathology associated with ultrasound observed.  Could be related to the trauma of the fall.  She is already on gabapentin. -X-ray. -Provided samples of Duexis. -Counseled on home exercise therapy and supportive care. -If no improvement consider bracing

## 2019-05-21 NOTE — Assessment & Plan Note (Signed)
Pain seems to be more joint related as it is occurring in the groin.  Has good movement on exam and previous x-ray showed no significant degenerative changes.  Possible for labral problem or hip flexor -Counseled on home exercise therapy and supportive care. -If no improvement consider ultrasound, injection or physical therapy.

## 2019-05-22 ENCOUNTER — Telehealth: Payer: Self-pay | Admitting: Family Medicine

## 2019-05-22 DIAGNOSIS — R293 Abnormal posture: Secondary | ICD-10-CM | POA: Diagnosis not present

## 2019-05-22 DIAGNOSIS — M545 Low back pain: Secondary | ICD-10-CM | POA: Diagnosis not present

## 2019-05-22 DIAGNOSIS — M546 Pain in thoracic spine: Secondary | ICD-10-CM | POA: Diagnosis not present

## 2019-05-22 DIAGNOSIS — M542 Cervicalgia: Secondary | ICD-10-CM | POA: Diagnosis not present

## 2019-05-22 NOTE — Telephone Encounter (Signed)
Spoke with patient about xray results.   Rosemarie Ax, MD [facility] 05/22/2019, 3:07 PM

## 2019-05-23 DIAGNOSIS — N390 Urinary tract infection, site not specified: Secondary | ICD-10-CM | POA: Diagnosis not present

## 2019-05-26 ENCOUNTER — Other Ambulatory Visit: Payer: Self-pay

## 2019-05-26 ENCOUNTER — Telehealth: Payer: Self-pay

## 2019-05-26 ENCOUNTER — Ambulatory Visit (INDEPENDENT_AMBULATORY_CARE_PROVIDER_SITE_OTHER): Payer: BC Managed Care – PPO | Admitting: Family Medicine

## 2019-05-26 ENCOUNTER — Ambulatory Visit (INDEPENDENT_AMBULATORY_CARE_PROVIDER_SITE_OTHER): Payer: BC Managed Care – PPO | Admitting: Psychology

## 2019-05-26 ENCOUNTER — Encounter: Payer: Self-pay | Admitting: Family Medicine

## 2019-05-26 DIAGNOSIS — E1140 Type 2 diabetes mellitus with diabetic neuropathy, unspecified: Secondary | ICD-10-CM

## 2019-05-26 DIAGNOSIS — Z712 Person consulting for explanation of examination or test findings: Secondary | ICD-10-CM | POA: Diagnosis not present

## 2019-05-26 DIAGNOSIS — I639 Cerebral infarction, unspecified: Secondary | ICD-10-CM | POA: Diagnosis not present

## 2019-05-26 DIAGNOSIS — N3001 Acute cystitis with hematuria: Secondary | ICD-10-CM

## 2019-05-26 DIAGNOSIS — Z794 Long term (current) use of insulin: Secondary | ICD-10-CM

## 2019-05-26 DIAGNOSIS — F4312 Post-traumatic stress disorder, chronic: Secondary | ICD-10-CM | POA: Diagnosis not present

## 2019-05-26 DIAGNOSIS — F4481 Dissociative identity disorder: Secondary | ICD-10-CM

## 2019-05-26 NOTE — Telephone Encounter (Signed)
Copied from [place] [id number]. Topic: General - Inquiry >> May 26, 2019 10:04 AM Scherrie Gerlach wrote: Reason for CRM: pt went to [facility] over the [facility] for possible UTI, pt states she got some concerning labs back she would like to discuss with Dr Sarajane Jews. Unable to reach office for appt . Please call.

## 2019-05-26 NOTE — Progress Notes (Signed)
Virtual Visit via Video Note  I connected with Miranda [name] on 05/26/19 at  3:00 PM EDT by a video enabled telemedicine application and verified that I am speaking with the correct person using two identifiers.  Location patient: home Location provider:work or home office Persons participating in the virtual visit: patient, provider  I discussed the limitations of evaluation and management by telemedicine and the availability of in person appointments. The patient expressed understanding and agreed to proceed.   HPI: [facility], treated with 5 d course of Bactrim.  Per pt the "urinalysis results were alarming".  Pt reads report to this provider which is c/w UTI, glucose also noted in urine.  Pt states she was concerned as her roommate has "kidney disease".  Pt has [name] a diabetic since 2005.  Sees Endo.  States fsbs has [name] elevated. Was in 300s now 160s-180s.  Pt states she was out of test strips last wk.  Pt also mentions taking senna for constipation with relief.   ROS: See pertinent positives and negatives per HPI.  Past Medical History:  Diagnosis Date  . Anxiety   . Arthritis   . Asthma   . Bowel obstruction ([place])   . Cardiac arrhythmia   . Depression    sees Dr. Matilde Haymaker in [place]   . Diabetes mellitus    type 2   . GERD (gastroesophageal reflux disease)   . Hyperlipidemia   . Hypertension   . Hypothyroidism    sees Dr. Elyse Hsu  . Insomnia   . Migraine syndrome   . Morbid obesity ([place])   . 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 Neck pain   . Neuropathy associated with endocrine disorder ([place])   . Sleep apnea with use of continuous positive airway pressure (CPAP)    Sleep apnea is resolved due to weight loss. 08/2017  . Stroke [facility]) 03-25-16   left MCA   . Stroke [facility]) 04/2016    Past Surgical History:  Procedure Laterality Date  . blocked itestinal repair  79   age 15 months  . BREAST BIOPSY Left 2017  . CHOLECYSTECTOMY   2011  . INDUCED ABORTION  1996   forced abortion  . INGUINAL HERNIA REPAIR Left 1980   age 81  . LAPAROSCOPIC ENDOMETRIOSIS FULGURATION  1998  . WISDOM TOOTH EXTRACTION  2007    Family History  Adopted: Yes  Problem Relation Age of Onset  . Heart disease Other        on both sides of family  . Diabetes Other        on both sides of family     Current Outpatient Medications:  .  albuterol (PROAIR HFA) 108 (90 Base) MCG/ACT inhaler, Inhale 2 puffs into the lungs every 4 (four) hours as needed for wheezing., Disp: 8.5 g, Rfl: 11 .  ARIPiprazole (ABILIFY) 30 MG tablet, Take 30 mg by mouth daily., Disp: , Rfl:  .  budesonide-formoterol (SYMBICORT) 160-4.5 MCG/ACT inhaler, Inhale 2 puffs into the lungs 2 (two) times daily., Disp: 1 Inhaler, Rfl: 11 .  buPROPion (WELLBUTRIN XL) 150 MG 24 hr tablet, , Disp: , Rfl:  .  clopidogrel (PLAVIX) 75 MG tablet, TAKE ONE TABLET BY MOUTH DAILY, Disp: 90 tablet, Rfl: 0 .  gabapentin (NEURONTIN) 300 MG capsule, TAKE ONE CAPSULE BY MOUTH THREE TIMES A DAY, Disp: 90 capsule, Rfl: 1 .  hydrOXYzine (ATARAX/VISTARIL) 50 MG tablet, Take 50 mg by mouth. Take 1  tablet every night and may take 3 other times as needed, Disp: , Rfl:  .  insulin aspart (NOVOLOG) 100 UNIT/ML injection, Use in the insulin pump as directed, Disp: 10 mL, Rfl: 0 .  insulin lispro (HUMALOG) 100 UNIT/ML injection, ADM 40 UNI [place] D, Disp: , Rfl:  .  levothyroxine (SYNTHROID, LEVOTHROID) 125 MCG tablet, Take 1 tablet (125 mcg total) by mouth daily before breakfast., Disp: 90 tablet, Rfl: 3 .  metoprolol succinate (TOPROL-XL) 25 MG 24 hr tablet, Take 0.5 tablets (12.5 mg total) by mouth daily., Disp: 30 tablet, Rfl: 0 .  Multiple Vitamin (MULTIVITAMIN) tablet, Take 1 tablet by mouth daily., Disp: , Rfl:  .  norethindrone (MICRONOR,CAMILA,ERRIN) 0.35 MG tablet, , Disp: , Rfl:  .  nystatin (NYSTATIN) powder, Apply topically 4 (four) times daily., Disp: 60 g, Rfl: 5 .  pantoprazole (PROTONIX) 40  MG tablet, TAKE ONE TABLET BY MOUTH DAILY, Disp: 30 tablet, Rfl: 2 .  prazosin (MINIPRESS) 5 MG capsule, Take 10 mg by mouth at bedtime., Disp: , Rfl:  .  promethazine (PHENERGAN) 25 MG tablet, Take 1 tablet (25 mg total) every 6 (six) hours as needed by mouth for nausea or vomiting., Disp: 10 tablet, Rfl: 0 .  promethazine (PHENERGAN) 25 MG tablet, Take 1 tablet (25 mg total) by mouth every 4 (four) hours as needed for nausea or vomiting., Disp: 60 tablet, Rfl: 1 .  rizatriptan (MAXALT) 10 MG tablet, Take 1 tablet (10 mg total) by mouth as needed for migraine. May repeat in 2 hours if needed, Disp: 30 tablet, Rfl: 5 .  rosuvastatin (CRESTOR) 20 MG tablet, TAKE ONE TABLET BY MOUTH DAILY, Disp: 30 tablet, Rfl: 6 .  tiZANidine (ZANAFLEX) 2 MG tablet, TAKE ONE TABLET BY MOUTH EVERY 8 HOURS AS NEEDED FOR MUSCLE SPASMS, Disp: 60 tablet, Rfl: 2 .  topiramate (TOPAMAX) 100 MG tablet, Take 100 mg by mouth 2 (two) times daily. , Disp: , Rfl:  .  traZODone (DESYREL) 150 MG tablet, Take by mouth at bedtime., Disp: , Rfl:  .  TRESIBA FLEXTOUCH 100 UNIT/ML SOPN FlexTouch Pen, , Disp: , Rfl:  .  valACYclovir (VALTREX) 1000 MG tablet, Take 1 tablet (1,000 mg total) by mouth 3 (three) times daily. (Patient taking differently: Take 1,000 mg by mouth 3 (three) times daily as needed. ), Disp: 21 tablet, Rfl: 5  EXAM:  VITALS per patient if applicable:  RR between 12-20 bpm  GENERAL: alert, oriented, appears well and in no acute distress  HEENT: atraumatic, conjunctiva clear, no obvious abnormalities on inspection of external nose and ears  NECK: normal movements of the head and neck  LUNGS: on inspection no signs of respiratory distress, breathing rate appears normal, no obvious gross SOB, gasping or wheezing  CV: no obvious cyanosis  MS: moves all visible extremities without noticeable abnormality  PSYCH/NEURO: pleasant and cooperative, no obvious depression or anxiety, speech and thought processing  grossly intact  ASSESSMENT AND PLAN:  Discussed the following assessment and plan:  Acute cystitis with hematuria  -Pt advised to complete course of Bactrim -hydration with water encouraged. -f/u prn  Encounter to discuss test results  -reviewed UA results with pt.  Questions answered. -discussed importance of good glycemic control  Type 2 diabetes with diabetic neuropathy, with long term current use of insulin -uncontrolled -lifestyle modifications encouraged -continue current meds -f/u with Endo.  F/u prn   I discussed the assessment and treatment plan with the patient. The patient was provided an opportunity  to ask questions and all were answered. The patient agreed with the plan and demonstrated an understanding of the instructions.   The patient was advised to call back or seek an in-person evaluation if the symptoms worsen or if the condition fails to improve as anticipated.   Billie Ruddy, MD

## 2019-05-26 NOTE — Telephone Encounter (Signed)
Patient has been scheduled with Dr. Volanda Napoleon for a virtual visit due to Dr. Sarajane Jews being out of the office.

## 2019-05-27 DIAGNOSIS — E785 Hyperlipidemia, unspecified: Secondary | ICD-10-CM | POA: Diagnosis not present

## 2019-05-27 DIAGNOSIS — E118 Type 2 diabetes mellitus with unspecified complications: Secondary | ICD-10-CM | POA: Diagnosis not present

## 2019-05-27 DIAGNOSIS — R1084 Generalized abdominal pain: Secondary | ICD-10-CM | POA: Diagnosis not present

## 2019-05-27 DIAGNOSIS — E039 Hypothyroidism, unspecified: Secondary | ICD-10-CM | POA: Diagnosis not present

## 2019-05-27 DIAGNOSIS — E1165 Type 2 diabetes mellitus with hyperglycemia: Secondary | ICD-10-CM | POA: Diagnosis not present

## 2019-05-27 DIAGNOSIS — N390 Urinary tract infection, site not specified: Secondary | ICD-10-CM | POA: Diagnosis not present

## 2019-05-30 DIAGNOSIS — M542 Cervicalgia: Secondary | ICD-10-CM | POA: Diagnosis not present

## 2019-05-30 DIAGNOSIS — M546 Pain in thoracic spine: Secondary | ICD-10-CM | POA: Diagnosis not present

## 2019-05-30 DIAGNOSIS — R293 Abnormal posture: Secondary | ICD-10-CM | POA: Diagnosis not present

## 2019-05-30 DIAGNOSIS — M545 Low back pain: Secondary | ICD-10-CM | POA: Diagnosis not present

## 2019-06-03 DIAGNOSIS — R293 Abnormal posture: Secondary | ICD-10-CM | POA: Diagnosis not present

## 2019-06-03 DIAGNOSIS — M546 Pain in thoracic spine: Secondary | ICD-10-CM | POA: Diagnosis not present

## 2019-06-03 DIAGNOSIS — M542 Cervicalgia: Secondary | ICD-10-CM | POA: Diagnosis not present

## 2019-06-03 DIAGNOSIS — M545 Low back pain: Secondary | ICD-10-CM | POA: Diagnosis not present

## 2019-06-09 ENCOUNTER — Ambulatory Visit: Payer: BC Managed Care – PPO | Admitting: Psychology

## 2019-06-10 DIAGNOSIS — M545 Low back pain: Secondary | ICD-10-CM | POA: Diagnosis not present

## 2019-06-10 DIAGNOSIS — M542 Cervicalgia: Secondary | ICD-10-CM | POA: Diagnosis not present

## 2019-06-10 DIAGNOSIS — M546 Pain in thoracic spine: Secondary | ICD-10-CM | POA: Diagnosis not present

## 2019-06-10 DIAGNOSIS — R293 Abnormal posture: Secondary | ICD-10-CM | POA: Diagnosis not present

## 2019-06-13 DIAGNOSIS — M542 Cervicalgia: Secondary | ICD-10-CM | POA: Diagnosis not present

## 2019-06-13 DIAGNOSIS — M545 Low back pain: Secondary | ICD-10-CM | POA: Diagnosis not present

## 2019-06-13 DIAGNOSIS — R293 Abnormal posture: Secondary | ICD-10-CM | POA: Diagnosis not present

## 2019-06-13 DIAGNOSIS — M546 Pain in thoracic spine: Secondary | ICD-10-CM | POA: Diagnosis not present

## 2019-06-16 ENCOUNTER — Ambulatory Visit (INDEPENDENT_AMBULATORY_CARE_PROVIDER_SITE_OTHER): Payer: BC Managed Care – PPO | Admitting: Psychology

## 2019-06-16 DIAGNOSIS — F4481 Dissociative identity disorder: Secondary | ICD-10-CM

## 2019-06-16 DIAGNOSIS — Z79899 Other long term (current) drug therapy: Secondary | ICD-10-CM | POA: Diagnosis not present

## 2019-06-16 LAB — CBC AND DIFFERENTIAL
HCT: 42 (ref 36–46)
Hemoglobin: 14.2 (ref 12.0–16.0)
Platelets: 194 (ref 150–399)
WBC: 9.9

## 2019-06-16 LAB — BASIC METABOLIC PANEL
BUN: 16 (ref 4–21)
Creatinine: 0.9 (ref 0.5–1.1)
Glucose: 207
Potassium: 4.3 (ref 3.4–5.3)
Sodium: 140 (ref 137–147)

## 2019-06-16 LAB — HEPATIC FUNCTION PANEL
ALT: 22 (ref 7–35)
AST: 25 (ref 13–35)
Alkaline Phosphatase: 54 (ref 25–125)
Bilirubin, Total: 0.3

## 2019-06-16 LAB — HEMOGLOBIN A1C: Hemoglobin A1C: 8.1

## 2019-06-17 ENCOUNTER — Ambulatory Visit: Payer: BC Managed Care – PPO | Admitting: Family Medicine

## 2019-06-17 ENCOUNTER — Ambulatory Visit: Payer: BC Managed Care – PPO

## 2019-06-17 ENCOUNTER — Ambulatory Visit (HOSPITAL_BASED_OUTPATIENT_CLINIC_OR_DEPARTMENT_OTHER)
Admission: RE | Admit: 2019-06-17 | Discharge: 2019-06-17 | Disposition: A | Discharge status: Home / Self Care | Payer: BC Managed Care – PPO | Source: Ambulatory Visit | Attending: Family Medicine | Admitting: Family Medicine

## 2019-06-17 ENCOUNTER — Encounter: Payer: Self-pay | Admitting: Family Medicine

## 2019-06-17 ENCOUNTER — Other Ambulatory Visit: Payer: Self-pay

## 2019-06-17 VITALS — BP 109/75 | HR 86 | Ht 62.0 in | Wt 225.0 lb

## 2019-06-17 DIAGNOSIS — M25552 Pain in left hip: Secondary | ICD-10-CM | POA: Insufficient documentation

## 2019-06-17 DIAGNOSIS — M1612 Unilateral primary osteoarthritis, left hip: Secondary | ICD-10-CM | POA: Diagnosis not present

## 2019-06-17 LAB — LIPID PANEL
Cholesterol: 141 (ref 0–200)
HDL: 44 (ref 35–70)
LDL Cholesterol: 74
Triglycerides: 148 (ref 40–160)

## 2019-06-17 MED ORDER — METHYLPREDNISOLONE ACETATE 40 MG/ML IJ SUSP
40.0000 mg | Freq: Once | INTRAMUSCULAR | Status: AC
  Administered 2019-06-17: 17:00:00 40 mg via INTRA_ARTICULAR

## 2019-06-17 NOTE — Assessment & Plan Note (Signed)
Seems to have resolved at this point. -Follow-up as needed.

## 2019-06-17 NOTE — Assessment & Plan Note (Signed)
Pain be related to the greater trochanteric area.  Seems to have a component posteriorly as well as in the inguinal region.  May very well be the joint.  -Greater troch injection today -X-rays. -Counseled on home exercise therapy and supportive care. -If no improvement will consider hip joint injection or MRI of the pelvis.

## 2019-06-17 NOTE — Patient Instructions (Signed)
Good to see you Please perform the exercises  Please try ice   Please send me a message in MyChart with any questions or updates.  Please see me back in 4-6 weeks. Unless we order an MRi then we can perform a virtual visit.   --Dr. Raeford Razor

## 2019-06-17 NOTE — Progress Notes (Signed)
Miranda [name] - 43 y.o. female MRN [id number]  Date of birth: [birth year]  SUBJECTIVE:  Including CC & ROS.  Chief Complaint  Patient presents with  . Follow-up    follow up for left hip / right hand    Miranda [name] is a 43 y.o. female that is following up for her right hand pain and left hip pain.  The hand pain seems to have resolved at this point.  She continues to have left hip pain.  This is occurring over the lateral posterior aspect as well as inguinal region of the hip.  The pain is intermittent in nature.  It is sharp and localized.  It seems to be worse if she is trying to cross her legs or go up stairs.  She denies any mechanical symptoms.  Denies any specific injury.  No previous history of surgery or injection.  Physical therapy did work with it some.  She is continuing to try some of these exercises at home.  Independent review of the right hand x-ray from 8/11 shows no significant abnormality.   Review of Systems  Constitutional: Negative for fever.  HENT: Negative for congestion.   Respiratory: Negative for cough.   Cardiovascular: Negative for chest pain.  Gastrointestinal: Negative for abdominal pain.  Musculoskeletal: Positive for gait problem.  Skin: Negative for color change.  Neurological: Negative for weakness.  Hematological: Negative for adenopathy.    HISTORY: Past Medical, Surgical, Social, and Family History Reviewed & Updated per EMR.   Pertinent Historical Findings include:  Past Medical History:  Diagnosis Date  . Anxiety   . Arthritis   . Asthma   . Bowel obstruction ([place])   . Cardiac arrhythmia   . Depression    sees Dr. Matilde Haymaker in [place]   . Diabetes mellitus    type 2   . GERD (gastroesophageal reflux disease)   . Hyperlipidemia   . Hypertension   . Hypothyroidism    sees Dr. Elyse Hsu  . Insomnia   . Migraine syndrome   . Morbid obesity ([place])   . 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 Neck pain   . Neuropathy associated  with endocrine disorder ([place])   . Sleep apnea with use of continuous positive airway pressure (CPAP)    Sleep apnea is resolved due to weight loss. 08/2017  . 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 Stroke [facility]) 04/2016    Past Surgical History:  Procedure Laterality Date  . blocked itestinal repair  69   age 72 months  . BREAST BIOPSY Left 2017  . CHOLECYSTECTOMY  2011  . INDUCED ABORTION  1996   forced abortion  . INGUINAL HERNIA REPAIR Left 1980   age 45  . LAPAROSCOPIC ENDOMETRIOSIS FULGURATION  1998  . WISDOM TOOTH EXTRACTION  2007    Allergies  Allergen Reactions  . Macrobid [Nitrofurantoin] Hives  . Metformin And Related Other (See Comments)    Abdominal cramping   . Byetta 10 Mcg Pen [Exenatide] Hives  . Clindamycin/Lincomycin Nausea And Vomiting  . Geodon [Ziprasidone Hcl]   . Lipitor [Atorvastatin]     Per patient this causes cramps  . Nsaids     Upset stomach   . Penicillins Other (See Comments)    Has patient had a PCN reaction causing immediate rash, facial/tongue/throat swelling, SOB or lightheadedness with hypotension: No Has patient had a PCN reaction causing severe rash involving mucus membranes or skin necrosis: No Has patient had a PCN  reaction that required hospitalization No Has patient had a PCN reaction occurring within the last 10 years: No If all of the above answers are "NO", then may proceed with Cephalosporin use.   Marland Kitchen Avocado Diarrhea and Other (See Comments)    Severe cramping, sweats, and diarrhea in upper GI/stomach  . Tramadol Hcl Itching and Rash    Family History  Adopted: Yes  Problem Relation Age of Onset  . Heart disease Other        on both sides of family  . Diabetes Other        on both sides of family     Social History   Socioeconomic History  . Marital status: Divorced    Spouse name: Not on file  . Number of children: 5  . Years of education: Not on file  . Highest education level: Not on file  Occupational  History  . Occupation: Nurse, learning disability  Social Needs  . Financial resource strain: Not on file  . Food insecurity    Worry: Not on file    Inability: Not on file  . Transportation needs    Medical: Not on file    Non-medical: Not on file  Tobacco Use  . Smoking status: Current Every Day Smoker    Packs/day: 0.50    Years: 0.50    Pack years: 0.25    Types: Cigarettes    Start date: 02/24/2018  . Smokeless tobacco: Never Used  . Tobacco comment: quit 03/25/2016  Substance and Sexual Activity  . Alcohol use: No    Alcohol/week: 0.0 standard drinks  . Drug use: Yes    Types: Marijuana    Comment: 2/10 last marijuana use  . Sexual activity: Not on file  Lifestyle  . Physical activity    Days per week: Not on file    Minutes per session: Not on file  . Stress: Not on file  Relationships  . Social Herbalist on phone: Not on file    Gets together: Not on file    Attends religious service: Not on file    Active member of club or organization: Not on file    Attends meetings of clubs or organizations: Not on file    Relationship status: Not on file  . Intimate partner violence    Fear of current or ex partner: Not on file    Emotionally abused: Not on file    Physically abused: Not on file    Forced sexual activity: Not on file  Other Topics Concern  . Not on file  Social History Narrative  . Not on file     PHYSICAL EXAM:  VS: BP 109/75   Pulse 86   Ht 5\' 2"  (1.575 m)   Wt 225 lb (102.1 kg)   BMI 41.15 kg/m  Physical Exam Gen: NAD, alert, cooperative with exam, well-appearing ENT: normal lips, normal nasal mucosa,  Eye: normal EOM, normal conjunctiva and lids CV:  no edema, +2 pedal pulses   Resp: no accessory muscle use, non-labored,  GI: no masses or tenderness, no hernia  Skin: no rashes, no areas of induration  Neuro: normal tone, normal sensation to touch Psych:  normal insight, alert and oriented MSK:  Left hip: Some  tenderness palpation of the greater trochanter. No specific tenderness palpation of the SI joint. Normal strength resistance with hip flexion. Some weakness with hip abduction. Positive FABER NVI.    Aspiration/Injection Procedure Note San Jetty Gittleman  1975/11/02  Procedure: Injection Indications: Left hip pain  Procedure Details Consent: Risks of procedure as well as the alternatives and risks of each were explained to the (patient/caregiver).  Consent for procedure obtained. Time Out: Verified patient identification, verified procedure, site/side was marked, verified correct patient position, special equipment/implants available, medications/allergies/relevent history reviewed, required imaging and test results available.  Performed.  The area was cleaned with iodine and alcohol swabs.   The patient instructions The left greater trochanteric region was injected using 1 cc's of 40 mg Depo-Medrol and 4 cc's of 0.25% bupivacaine with a 22 3 1/2" needle.  Ultrasound was used. Images were obtained in short views showing the injection.     A sterile dressing was applied.  Patient did tolerate procedure well.       ASSESSMENT & PLAN:   Hand pain, right Seems to have resolved at this point. -Follow-up as needed.  Greater trochanteric pain syndrome of left lower extremity Pain be related to the greater trochanteric area.  Seems to have a component posteriorly as well as in the inguinal region.  May very well be the joint.  -Greater troch injection today -X-rays. -Counseled on home exercise therapy and supportive care. -If no improvement will consider hip joint injection or MRI of the pelvis.

## 2019-06-18 ENCOUNTER — Other Ambulatory Visit: Payer: Self-pay | Admitting: Family Medicine

## 2019-06-18 ENCOUNTER — Telehealth: Payer: Self-pay | Admitting: Family Medicine

## 2019-06-18 NOTE — Telephone Encounter (Signed)
Informed of xray results.   Rosemarie Ax, MD [facility] 06/18/2019, 9:38 AM

## 2019-06-23 ENCOUNTER — Ambulatory Visit (INDEPENDENT_AMBULATORY_CARE_PROVIDER_SITE_OTHER): Payer: BC Managed Care – PPO | Admitting: Psychology

## 2019-06-23 DIAGNOSIS — F4481 Dissociative identity disorder: Secondary | ICD-10-CM | POA: Diagnosis not present

## 2019-06-25 ENCOUNTER — Encounter: Payer: Self-pay | Admitting: Family Medicine

## 2019-06-26 ENCOUNTER — Other Ambulatory Visit: Payer: Self-pay

## 2019-06-26 ENCOUNTER — Other Ambulatory Visit: Payer: Self-pay | Admitting: Family Medicine

## 2019-06-26 ENCOUNTER — Encounter: Payer: Self-pay | Admitting: Family Medicine

## 2019-06-26 ENCOUNTER — Ambulatory Visit: Payer: BC Managed Care – PPO | Admitting: Family Medicine

## 2019-06-26 DIAGNOSIS — M25551 Pain in right hip: Secondary | ICD-10-CM

## 2019-06-26 DIAGNOSIS — M25552 Pain in left hip: Secondary | ICD-10-CM

## 2019-06-26 MED ORDER — TIZANIDINE HCL 2 MG PO TABS: ORAL_TABLET | ORAL | 2 refills | Status: DC

## 2019-06-26 MED ORDER — PREDNISONE 5 MG PO TABS: ORAL_TABLET | ORAL | 0 refills | Status: DC

## 2019-06-26 NOTE — Assessment & Plan Note (Signed)
Acute exacerbation of right hip pain.  Seems to be occurring over the gluteus medius and minimus.  Likely a strain.  Has good range of motion and strength. -Prednisone. -Counseled on home exercise therapy and supportive care. -Refilled tizanidine. -Can consider injection if no improvement.

## 2019-06-26 NOTE — Patient Instructions (Signed)
Good to see you Please try the medicine  Please try ice on the area   Please send me a message in MyChart with any questions or updates.  Please see me back in 4 weeks.   --Dr. Raeford Razor

## 2019-06-26 NOTE — Progress Notes (Signed)
Miranda [name] - 43 y.o. female MRN [id number]  Date of birth: 1976/03/01  SUBJECTIVE:  Including CC & ROS.  Chief Complaint  Patient presents with  . Hip Pain    right hip    Miranda [name] is a 43 y.o. female that is presenting with acute right hip pain.  She is experiencing the pain in the gluteus of the right side.  She started having this pain yesterday after moving a desk.  She was moving it and had 1 step down on the stairs.  Since that time the pain is been severe and localized to this region.  Does have some pain in the lower back as well.  Mild improvement with ibuprofen.  Pain is sharp and stabbing.  It is worse with moving and walking.  Denies any radicular symptoms.  No saddle anesthesia or urinary incontinence.    Review of Systems  Constitutional: Negative for fever.  HENT: Negative for congestion.   Respiratory: Negative for cough.   Cardiovascular: Negative for chest pain.  Gastrointestinal: Negative for abdominal pain.  Musculoskeletal: Positive for back pain.  Neurological: Negative for weakness.  Hematological: Negative for adenopathy.    HISTORY: Past Medical, Surgical, Social, and Family History Reviewed & Updated per EMR.   Pertinent Historical Findings include:  Past Medical History:  Diagnosis Date  . Anxiety   . Arthritis   . Asthma   . Bowel obstruction ([place])   . Cardiac arrhythmia   . Depression    sees Dr. Matilde Haymaker in [place]   . Diabetes mellitus    type 2   . GERD (gastroesophageal reflux disease)   . Hyperlipidemia   . Hypertension   . Hypothyroidism    sees Dr. Elyse Hsu  . Insomnia   . Migraine syndrome   . Morbid obesity ([place])   . 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 Neck pain   . Neuropathy associated with endocrine disorder ([place])   . Sleep apnea with use of continuous positive airway pressure (CPAP)    Sleep apnea is resolved due to weight loss. 08/2017  . 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 Stroke [facility]) 04/2016     Past Surgical History:  Procedure Laterality Date  . blocked itestinal repair  77   age 66 months  . BREAST BIOPSY Left 2017  . CHOLECYSTECTOMY  2011  . INDUCED ABORTION  1996   forced abortion  . INGUINAL HERNIA REPAIR Left 1980   age 79  . LAPAROSCOPIC ENDOMETRIOSIS FULGURATION  1998  . WISDOM TOOTH EXTRACTION  2007    Allergies  Allergen Reactions  . Macrobid [Nitrofurantoin] Hives  . Metformin And Related Other (See Comments)    Abdominal cramping   . Byetta 10 Mcg Pen [Exenatide] Hives  . Clindamycin/Lincomycin Nausea And Vomiting  . Geodon [Ziprasidone Hcl]   . Lipitor [Atorvastatin]     Per patient this causes cramps  . Nsaids     Upset stomach   . Penicillins Other (See Comments)    Has patient had a PCN reaction causing immediate rash, facial/tongue/throat swelling, SOB or lightheadedness with hypotension: No Has patient had a PCN reaction causing severe rash involving mucus membranes or skin necrosis: No Has patient had a PCN reaction that required hospitalization No Has patient had a PCN reaction occurring within the last 10 years: No If all of the above answers are "NO", then may proceed with Cephalosporin use.   Marland Kitchen Avocado Diarrhea and Other (See Comments)  Severe cramping, sweats, and diarrhea in upper GI/stomach  . Tramadol Hcl Itching and Rash    Family History  Adopted: Yes  Problem Relation Age of Onset  . Heart disease Other        on both sides of family  . Diabetes Other        on both sides of family     Social History   Socioeconomic History  . Marital status: Divorced    Spouse name: Not on file  . Number of children: 5  . Years of education: Not on file  . Highest education level: Not on file  Occupational History  . Occupation: Nurse, learning disability  Social Needs  . Financial resource strain: Not on file  . Food insecurity    Worry: Not on file    Inability: Not on file  . Transportation needs    Medical: Not on  file    Non-medical: Not on file  Tobacco Use  . Smoking status: Current Every Day Smoker    Packs/day: 0.50    Years: 0.50    Pack years: 0.25    Types: Cigarettes    Start date: 02/24/2018  . Smokeless tobacco: Never Used  . Tobacco comment: quit 03/25/2016  Substance and Sexual Activity  . Alcohol use: No    Alcohol/week: 0.0 standard drinks  . Drug use: Yes    Types: Marijuana    Comment: 2/10 last marijuana use  . Sexual activity: Not on file  Lifestyle  . Physical activity    Days per week: Not on file    Minutes per session: Not on file  . Stress: Not on file  Relationships  . Social Herbalist on phone: Not on file    Gets together: Not on file    Attends religious service: Not on file    Active member of club or organization: Not on file    Attends meetings of clubs or organizations: Not on file    Relationship status: Not on file  . Intimate partner violence    Fear of current or ex partner: Not on file    Emotionally abused: Not on file    Physically abused: Not on file    Forced sexual activity: Not on file  Other Topics Concern  . Not on file  Social History Narrative  . Not on file     PHYSICAL EXAM:  VS: BP 118/80   Ht 5\' 2"  (1.575 m)   Wt 222 lb (100.7 kg)   BMI 40.60 kg/m  Physical Exam Gen: NAD, alert, cooperative with exam, well-appearing ENT: normal lips, normal nasal mucosa,  Eye: normal EOM, normal conjunctiva and lids CV:  no edema, +2 pedal pulses   Resp: no accessory muscle use, non-labored,  Skin: no rashes, no areas of induration  Neuro: normal tone, normal sensation to touch Psych:  normal insight, alert and oriented MSK:  Right hip: Normal Ir and ER  TTP of the right glute medius and minimus  Normal strength to resistance with hip flexion  Negative SLR  NVI      ASSESSMENT & PLAN:   Right hip pain Acute exacerbation of right hip pain.  Seems to be occurring over the gluteus medius and minimus.  Likely a  strain.  Has good range of motion and strength. -Prednisone. -Counseled on home exercise therapy and supportive care. -Refilled tizanidine. -Can consider injection if no improvement.

## 2019-06-30 ENCOUNTER — Ambulatory Visit (INDEPENDENT_AMBULATORY_CARE_PROVIDER_SITE_OTHER): Payer: BC Managed Care – PPO | Admitting: Psychology

## 2019-06-30 DIAGNOSIS — F4481 Dissociative identity disorder: Secondary | ICD-10-CM | POA: Diagnosis not present

## 2019-07-09 DIAGNOSIS — R309 Painful micturition, unspecified: Secondary | ICD-10-CM | POA: Diagnosis not present

## 2019-07-09 DIAGNOSIS — N390 Urinary tract infection, site not specified: Secondary | ICD-10-CM | POA: Diagnosis not present

## 2019-07-09 DIAGNOSIS — N760 Acute vaginitis: Secondary | ICD-10-CM | POA: Diagnosis not present

## 2019-07-14 ENCOUNTER — Ambulatory Visit (INDEPENDENT_AMBULATORY_CARE_PROVIDER_SITE_OTHER): Payer: BC Managed Care – PPO | Admitting: Psychology

## 2019-07-14 DIAGNOSIS — F4481 Dissociative identity disorder: Secondary | ICD-10-CM | POA: Diagnosis not present

## 2019-07-15 ENCOUNTER — Encounter: Payer: Self-pay | Admitting: Family Medicine

## 2019-07-15 NOTE — Telephone Encounter (Unsigned)
Copied from [place] [id number]. Topic: General - Other >> Jul 14, 2019  9:40 AM Rainey Pines A wrote: Patient would like a callback from Dr. Sarajane Jews nurse in regards to an update on [facility] paperwork

## 2019-07-16 ENCOUNTER — Encounter: Payer: Self-pay | Admitting: Family Medicine

## 2019-07-16 NOTE — Telephone Encounter (Signed)
We have sent such a list in her My Chart so she can print this out at home. Please let me know if this does not work

## 2019-07-17 DIAGNOSIS — Z20828 Contact with and (suspected) exposure to other viral communicable diseases: Secondary | ICD-10-CM | POA: Diagnosis not present

## 2019-07-19 ENCOUNTER — Other Ambulatory Visit: Payer: Self-pay | Admitting: Family Medicine

## 2019-07-21 ENCOUNTER — Ambulatory Visit (INDEPENDENT_AMBULATORY_CARE_PROVIDER_SITE_OTHER): Payer: BC Managed Care – PPO | Admitting: Psychology

## 2019-07-21 DIAGNOSIS — F4481 Dissociative identity disorder: Secondary | ICD-10-CM | POA: Diagnosis not present

## 2019-07-22 ENCOUNTER — Ambulatory Visit: Payer: BC Managed Care – PPO | Admitting: Family Medicine

## 2019-07-24 DIAGNOSIS — Z20828 Contact with and (suspected) exposure to other viral communicable diseases: Secondary | ICD-10-CM | POA: Diagnosis not present

## 2019-07-25 ENCOUNTER — Ambulatory Visit
Admission: RE | Admit: 2019-07-25 | Discharge: 2019-07-25 | Disposition: A | Discharge status: Home / Self Care | Payer: BC Managed Care – PPO | Source: Ambulatory Visit | Attending: Family Medicine | Admitting: Family Medicine

## 2019-07-25 ENCOUNTER — Other Ambulatory Visit: Payer: Self-pay

## 2019-07-25 DIAGNOSIS — M25552 Pain in left hip: Secondary | ICD-10-CM

## 2019-07-25 DIAGNOSIS — S76012A Strain of muscle, fascia and tendon of left hip, initial encounter: Secondary | ICD-10-CM | POA: Diagnosis not present

## 2019-07-28 ENCOUNTER — Ambulatory Visit (INDEPENDENT_AMBULATORY_CARE_PROVIDER_SITE_OTHER): Payer: BC Managed Care – PPO | Admitting: Family Medicine

## 2019-07-28 ENCOUNTER — Other Ambulatory Visit: Payer: Self-pay

## 2019-07-28 DIAGNOSIS — S73192A Other sprain of left hip, initial encounter: Secondary | ICD-10-CM

## 2019-07-28 MED ORDER — TIZANIDINE HCL 2 MG PO TABS: ORAL_TABLET | ORAL | 1 refills | Status: DC

## 2019-07-28 MED ORDER — GABAPENTIN 300 MG PO CAPS: 300.0000 mg | ORAL_CAPSULE | Freq: Three times a day (TID) | ORAL | 0 refills | Status: DC

## 2019-07-28 NOTE — Progress Notes (Signed)
Virtual Visit via Video Note  I connected with Rylann Tolly Colvard on 07/28/19 at  8:10 AM EDT by a video enabled telemedicine application and verified that I am speaking with the correct person using two identifiers.   I discussed the limitations of evaluation and management by telemedicine and the availability of in person appointments. The patient expressed understanding and agreed to proceed.  History of Present Illness:  Ms. Sabas is a 43 year old female that is following up for her left hip pain.  Review of the MRI that was conducted on 9/25 showed mild superior labral tear.  She initially had a greater trochanteric bursa injection and did not have any significant improvement of her pain.  She still is having pain in the inguinal region.  She also needs refills of gabapentin and tizanidine.   Observations/Objective:  Gen: NAD, alert, well-appearing ENT: normal lips, normal nasal mucosa,  Eye: normal EOM, normal conjunctiva and lids Resp: no accessory muscle use, non-labored,     Assessment and Plan: MRi showed some labral pathology. Possible this is contributing to her symptoms. Lateral hip pain has improved.  - refilled gabapentin and tizanidine  - counseled on possible joint injection  - counseled on PT. She will call back if she needs a referral since she is currently in PT.  - counseled on supportive care    Follow Up Instructions:    I discussed the assessment and treatment plan with the patient. The patient was provided an opportunity to ask questions and all were answered. The patient agreed with the plan and demonstrated an understanding of the instructions.   The patient was advised to call back or seek an in-person evaluation if the symptoms worsen or if the condition fails to improve as anticipated.    Clearance Coots, MD

## 2019-07-28 NOTE — Assessment & Plan Note (Addendum)
MRi showed some labral pathology. Possible this is contributing to her symptoms. Lateral hip pain has improved.  - refilled gabapentin and tizanidine  - counseled on possible joint injection  - counseled on PT. She will call back if she needs a referral since she is currently in PT.  - counseled on supportive care

## 2019-07-31 ENCOUNTER — Other Ambulatory Visit: Payer: Self-pay | Admitting: Family Medicine

## 2019-08-04 ENCOUNTER — Ambulatory Visit (INDEPENDENT_AMBULATORY_CARE_PROVIDER_SITE_OTHER): Payer: BC Managed Care – PPO | Admitting: Psychology

## 2019-08-04 DIAGNOSIS — F4481 Dissociative identity disorder: Secondary | ICD-10-CM | POA: Diagnosis not present

## 2019-08-06 DIAGNOSIS — M25561 Pain in right knee: Secondary | ICD-10-CM | POA: Diagnosis not present

## 2019-08-06 DIAGNOSIS — M25562 Pain in left knee: Secondary | ICD-10-CM | POA: Diagnosis not present

## 2019-08-06 DIAGNOSIS — M25552 Pain in left hip: Secondary | ICD-10-CM | POA: Diagnosis not present

## 2019-08-06 DIAGNOSIS — M25652 Stiffness of left hip, not elsewhere classified: Secondary | ICD-10-CM | POA: Diagnosis not present

## 2019-08-08 ENCOUNTER — Ambulatory Visit: Payer: BC Managed Care – PPO | Admitting: Family Medicine

## 2019-08-08 ENCOUNTER — Ambulatory Visit: Payer: Self-pay

## 2019-08-08 ENCOUNTER — Other Ambulatory Visit: Payer: Self-pay

## 2019-08-08 ENCOUNTER — Encounter: Payer: Self-pay | Admitting: Family Medicine

## 2019-08-08 VITALS — BP 118/78 | HR 75 | Temp 97.6°F | Wt 226.8 lb

## 2019-08-08 DIAGNOSIS — M25562 Pain in left knee: Secondary | ICD-10-CM | POA: Diagnosis not present

## 2019-08-08 DIAGNOSIS — E1140 Type 2 diabetes mellitus with diabetic neuropathy, unspecified: Secondary | ICD-10-CM | POA: Diagnosis not present

## 2019-08-08 DIAGNOSIS — M25561 Pain in right knee: Secondary | ICD-10-CM | POA: Diagnosis not present

## 2019-08-08 DIAGNOSIS — M25652 Stiffness of left hip, not elsewhere classified: Secondary | ICD-10-CM | POA: Diagnosis not present

## 2019-08-08 DIAGNOSIS — Z794 Long term (current) use of insulin: Secondary | ICD-10-CM | POA: Diagnosis not present

## 2019-08-08 DIAGNOSIS — M25552 Pain in left hip: Secondary | ICD-10-CM | POA: Diagnosis not present

## 2019-08-08 DIAGNOSIS — I10 Essential (primary) hypertension: Secondary | ICD-10-CM

## 2019-08-08 NOTE — Telephone Encounter (Signed)
Noted  

## 2019-08-08 NOTE — Telephone Encounter (Signed)
Incoming call from Pt.  Who report blood Pressure as being elevated  Only has on value to report  126/92.  It was taken a Physical Therapy appointment.    Patient Denies having a history .    Patient reports being tired all the time.  Pt.  Reports having a headache.  LMP Sept 10th.  Schedule an appointment for  Later today 10 45am       Reason for Disposition . Systolic BP  >= 0000000 OR Diastolic >= 123XX123  Answer Assessment - Initial Assessment Questions 1. BLOOD PRESSURE: "What is the blood pressure?" "Did you take at least two measurements 5 minutes apart?"   126/92 2. ONSET: "When did you take your blood pressure?"     Today 3. HOW: "How did you obtain the blood pressure?" (e.g., visiting nurse, automatic home BP monitor)      4. HISTORY: "Do you have a history of high blood pressure?"    denies 5. MEDICATIONS: "Are you taking any medications for blood pressure?" "Have you missed any doses recently?"    denies 6. OTHER SYMPTOMS: "Do you have any symptoms?" (e.g., headache, chest pain, blurred vision, difficulty breathing, weakness)     Headache  7. PREGNANCY: "Is there any chance you are pregnant?" "When was your last menstrual period?"     Sept. 10 th  Protocols used: HIGH BLOOD PRESSURE-A-AH

## 2019-08-08 NOTE — Progress Notes (Signed)
   Subjective:    Patient ID: Miranda [name], female    DOB: [birth year], 43 y.o.   MRN: [id number]  HPI Here to discuss a few things. She has been extra tired lately but she thinks this may be due to her work. She works 3-4 days a week, but these are 12 hour days and they take a lot out of her. She sleeps well at night. Also she was in PT this morning, and when they took her BP at the end of the session it was 126/92. This alarmed her so she came here. As we speak she feels fine and his no symptoms. She was recently taken off Jardiance by Dr. Garnet Koyanagi due to it causing yeast vaginitis.    Review of Systems  Constitutional: Positive for fatigue.  Respiratory: Negative.   Cardiovascular: Negative.   Neurological: Negative.        Objective:   Physical Exam Constitutional:      Appearance: Normal appearance.  Cardiovascular:     Rate and Rhythm: Normal rate and regular rhythm.     Pulses: Normal pulses.     Heart sounds: Normal heart sounds.  Pulmonary:     Effort: Pulmonary effort is normal.     Breath sounds: Normal breath sounds.  Neurological:     Mental Status: She is alert.           Assessment & Plan:  She seems to be doing well and her BP is normal. I think she is simply tried from working such long days and from dealing with the stress of her job.hopefully she can get some rest this weekend. Recheck prn.  Alysia Penna, MD

## 2019-08-12 DIAGNOSIS — M25561 Pain in right knee: Secondary | ICD-10-CM | POA: Diagnosis not present

## 2019-08-12 DIAGNOSIS — M25562 Pain in left knee: Secondary | ICD-10-CM | POA: Diagnosis not present

## 2019-08-12 DIAGNOSIS — M25652 Stiffness of left hip, not elsewhere classified: Secondary | ICD-10-CM | POA: Diagnosis not present

## 2019-08-12 DIAGNOSIS — M25552 Pain in left hip: Secondary | ICD-10-CM | POA: Diagnosis not present

## 2019-08-15 DIAGNOSIS — M25652 Stiffness of left hip, not elsewhere classified: Secondary | ICD-10-CM | POA: Diagnosis not present

## 2019-08-15 DIAGNOSIS — M25552 Pain in left hip: Secondary | ICD-10-CM | POA: Diagnosis not present

## 2019-08-15 DIAGNOSIS — M25562 Pain in left knee: Secondary | ICD-10-CM | POA: Diagnosis not present

## 2019-08-15 DIAGNOSIS — M25561 Pain in right knee: Secondary | ICD-10-CM | POA: Diagnosis not present

## 2019-08-18 ENCOUNTER — Ambulatory Visit (INDEPENDENT_AMBULATORY_CARE_PROVIDER_SITE_OTHER): Payer: BC Managed Care – PPO | Admitting: Psychology

## 2019-08-18 DIAGNOSIS — F4481 Dissociative identity disorder: Secondary | ICD-10-CM | POA: Diagnosis not present

## 2019-08-19 DIAGNOSIS — M25552 Pain in left hip: Secondary | ICD-10-CM | POA: Diagnosis not present

## 2019-08-19 DIAGNOSIS — M25652 Stiffness of left hip, not elsewhere classified: Secondary | ICD-10-CM | POA: Diagnosis not present

## 2019-08-19 DIAGNOSIS — M25562 Pain in left knee: Secondary | ICD-10-CM | POA: Diagnosis not present

## 2019-08-19 DIAGNOSIS — M25561 Pain in right knee: Secondary | ICD-10-CM | POA: Diagnosis not present

## 2019-08-21 DIAGNOSIS — M25562 Pain in left knee: Secondary | ICD-10-CM | POA: Diagnosis not present

## 2019-08-21 DIAGNOSIS — M25561 Pain in right knee: Secondary | ICD-10-CM | POA: Diagnosis not present

## 2019-08-21 DIAGNOSIS — M25552 Pain in left hip: Secondary | ICD-10-CM | POA: Diagnosis not present

## 2019-08-21 DIAGNOSIS — M25652 Stiffness of left hip, not elsewhere classified: Secondary | ICD-10-CM | POA: Diagnosis not present

## 2019-08-23 DIAGNOSIS — N390 Urinary tract infection, site not specified: Secondary | ICD-10-CM | POA: Diagnosis not present

## 2019-08-24 DIAGNOSIS — I639 Cerebral infarction, unspecified: Secondary | ICD-10-CM | POA: Diagnosis not present

## 2019-08-25 DIAGNOSIS — G43909 Migraine, unspecified, not intractable, without status migrainosus: Secondary | ICD-10-CM | POA: Diagnosis not present

## 2019-08-25 DIAGNOSIS — I639 Cerebral infarction, unspecified: Secondary | ICD-10-CM | POA: Diagnosis not present

## 2019-08-25 DIAGNOSIS — G4733 Obstructive sleep apnea (adult) (pediatric): Secondary | ICD-10-CM | POA: Diagnosis not present

## 2019-08-29 DIAGNOSIS — M25652 Stiffness of left hip, not elsewhere classified: Secondary | ICD-10-CM | POA: Diagnosis not present

## 2019-08-29 DIAGNOSIS — M25562 Pain in left knee: Secondary | ICD-10-CM | POA: Diagnosis not present

## 2019-08-29 DIAGNOSIS — M25552 Pain in left hip: Secondary | ICD-10-CM | POA: Diagnosis not present

## 2019-08-29 DIAGNOSIS — M25561 Pain in right knee: Secondary | ICD-10-CM | POA: Diagnosis not present

## 2019-09-01 ENCOUNTER — Ambulatory Visit (INDEPENDENT_AMBULATORY_CARE_PROVIDER_SITE_OTHER): Payer: BC Managed Care – PPO | Admitting: Psychology

## 2019-09-01 DIAGNOSIS — F3110 Bipolar disorder, current episode manic without psychotic features, unspecified: Secondary | ICD-10-CM

## 2019-09-01 DIAGNOSIS — F441 Dissociative fugue: Secondary | ICD-10-CM | POA: Diagnosis not present

## 2019-09-01 DIAGNOSIS — F4312 Post-traumatic stress disorder, chronic: Secondary | ICD-10-CM

## 2019-09-03 ENCOUNTER — Telehealth: Payer: Self-pay | Admitting: Family Medicine

## 2019-09-03 NOTE — Telephone Encounter (Signed)
Patient requesting refill of Gabapentin   Pharmacy: [facility]

## 2019-09-03 NOTE — Telephone Encounter (Signed)
Medication Refill - Medication: rosuvastatin (CRESTOR) 20 MG tablet    Has the patient contacted their pharmacy? Yes.   (Agent: If no, request that the patient contact the pharmacy for the refill.) (Agent: If yes, when and what did the pharmacy advise?)  Preferred Pharmacy (with phone number or street name):  [place], [address] [phone] (Phone) [phone] (Fax)    Agent: Please be advised that RX refills may take up to 3 business days. We ask that you follow-up with your pharmacy.

## 2019-09-04 ENCOUNTER — Other Ambulatory Visit: Payer: Self-pay

## 2019-09-04 MED ORDER — GABAPENTIN 300 MG PO CAPS: 300.0000 mg | ORAL_CAPSULE | Freq: Three times a day (TID) | ORAL | 0 refills | Status: DC

## 2019-09-04 MED ORDER — ROSUVASTATIN CALCIUM 20 MG PO TABS: 20.0000 mg | ORAL_TABLET | Freq: Every day | ORAL | 6 refills | Status: AC

## 2019-09-04 NOTE — Telephone Encounter (Signed)
Refilled gabapentin.   Rosemarie Ax, MD [facility] 09/04/2019, 8:04 AM

## 2019-09-04 NOTE — Telephone Encounter (Signed)
Rx sent to pharmacy   

## 2019-09-15 ENCOUNTER — Ambulatory Visit (INDEPENDENT_AMBULATORY_CARE_PROVIDER_SITE_OTHER): Payer: BC Managed Care – PPO | Admitting: Psychology

## 2019-09-15 DIAGNOSIS — F4312 Post-traumatic stress disorder, chronic: Secondary | ICD-10-CM

## 2019-09-15 DIAGNOSIS — F4481 Dissociative identity disorder: Secondary | ICD-10-CM | POA: Diagnosis not present

## 2019-09-16 ENCOUNTER — Other Ambulatory Visit: Payer: Self-pay | Admitting: Family Medicine

## 2019-09-17 ENCOUNTER — Encounter: Payer: Self-pay | Admitting: Family Medicine

## 2019-09-17 ENCOUNTER — Ambulatory Visit: Payer: BC Managed Care – PPO | Admitting: Family Medicine

## 2019-09-17 ENCOUNTER — Other Ambulatory Visit: Payer: Self-pay

## 2019-09-17 VITALS — Ht 62.0 in | Wt 225.0 lb

## 2019-09-17 DIAGNOSIS — M7581 Other shoulder lesions, right shoulder: Secondary | ICD-10-CM | POA: Diagnosis not present

## 2019-09-17 NOTE — Patient Instructions (Signed)
Good to see you Please try the sling. Please try to avoid long uses of the sling  Please try the duexis  Please try the exercises   Please send me a message in MyChart with any questions or updates.  Please see me back in 4-6 weeks.   --Dr. Raeford Razor

## 2019-09-17 NOTE — Progress Notes (Signed)
Miranda [name] - 43 y.o. female MRN [id number]  Date of birth: 03-14-1976  SUBJECTIVE:  Including CC & ROS.  Chief Complaint  Patient presents with  . Shoulder Pain    right shoulder x 2-3 weeks    Miranda [name] is a 43 y.o. female that is presenting with acute right shoulder pain.  The pain is been ongoing over the past 2 to 3 weeks.  She denies any specific inciting event.  It does seem to be worse with some of the things she does at work.  She notices an external rotation as well as abduction.  No prior history of surgery on the shoulder or neck.  Is taking gabapentin and tizanidine with limited improvement.  This feels different from the previous right shoulder pain that she is experienced.  She does experience pain down to the elbow and has had intermittent pain down to the fingers.  Seems to be worse when she is leaning on that side.  No bruising or swelling.  Independent review of the right shoulder x-ray from 2019 shows no acute abnormality.  Independent review of the MRI cervical spine from 2019 shows mild disc degeneration most notably at C4-5 and C5-6.   Review of Systems  Constitutional: Negative for fever.  HENT: Negative for congestion.   Respiratory: Negative for cough.   Cardiovascular: Negative for chest pain.  Gastrointestinal: Negative for abdominal pain.  Musculoskeletal: Negative for back pain.  Skin: Negative for color change.  Neurological: Negative for weakness.  Hematological: Negative for adenopathy.    HISTORY: Past Medical, Surgical, Social, and Family History Reviewed & Updated per EMR.   Pertinent Historical Findings include:  Past Medical History:  Diagnosis Date  . Anxiety   . Arthritis   . Asthma   . Bowel obstruction ([place])   . Cardiac arrhythmia   . Depression    sees Dr. Matilde Haymaker in [place]   . Diabetes mellitus    sees Dr. Larna Daughters [place]ver[place]   . GERD (gastroesophageal reflux disease)   . Hyperlipidemia   . Hypertension   .  Hypothyroidism    sees Dr. Elyse Hsu  . Insomnia   . Migraine syndrome   . Morbid obesity ([place])   . 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 Neck pain   . Neuropathy associated with endocrine disorder ([place])   . Sleep apnea with use of continuous positive airway pressure (CPAP)    Sleep apnea is resolved due to weight loss. 08/2017  . 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 Stroke [facility]) 04/2016    Past Surgical History:  Procedure Laterality Date  . blocked itestinal repair  33   age 22 months  . BREAST BIOPSY Left 2017  . CHOLECYSTECTOMY  2011  . INDUCED ABORTION  1996   forced abortion  . INGUINAL HERNIA REPAIR Left 1980   age 66  . LAPAROSCOPIC ENDOMETRIOSIS FULGURATION  1998  . WISDOM TOOTH EXTRACTION  2007    Allergies  Allergen Reactions  . Macrobid [Nitrofurantoin] Hives  . Metformin And Related Other (See Comments)    Abdominal cramping   . Byetta 10 Mcg Pen [Exenatide] Hives  . Clindamycin/Lincomycin Nausea And Vomiting  . Geodon [Ziprasidone Hcl]   . Lipitor [Atorvastatin]     Per patient this causes cramps  . Nsaids     Upset stomach   . Penicillins Other (See Comments)    Has patient had a PCN reaction causing immediate rash, facial/tongue/throat swelling, SOB or lightheadedness  with hypotension: No Has patient had a PCN reaction causing severe rash involving mucus membranes or skin necrosis: No Has patient had a PCN reaction that required hospitalization No Has patient had a PCN reaction occurring within the last 10 years: No If all of the above answers are "NO", then may proceed with Cephalosporin use.   Danelle Berry [Dulaglutide]     Abdominal pain   . Avocado Diarrhea and Other (See Comments)    Severe cramping, sweats, and diarrhea in upper GI/stomach  . Tramadol Hcl Itching and Rash    Family History  Adopted: Yes  Problem Relation Age of Onset  . Heart disease Other        on both sides of family  . Diabetes Other        on both sides of family      Social History   Socioeconomic History  . Marital status: Divorced    Spouse name: Not on file  . Number of children: 5  . Years of education: Not on file  . Highest education level: Not on file  Occupational History  . Occupation: Nurse, learning disability  Social Needs  . Financial resource strain: Not on file  . Food insecurity    Worry: Not on file    Inability: Not on file  . Transportation needs    Medical: Not on file    Non-medical: Not on file  Tobacco Use  . Smoking status: Current Every Day Smoker    Packs/day: 0.50    Years: 0.50    Pack years: 0.25    Types: Cigarettes    Start date: 02/24/2018  . Smokeless tobacco: Never Used  . Tobacco comment: quit 03/25/2016  Substance and Sexual Activity  . Alcohol use: No    Alcohol/week: 0.0 standard drinks  . Drug use: Yes    Types: Marijuana    Comment: 2/10 last marijuana use  . Sexual activity: Not on file  Lifestyle  . Physical activity    Days per week: Not on file    Minutes per session: Not on file  . Stress: Not on file  Relationships  . Social Herbalist on phone: Not on file    Gets together: Not on file    Attends religious service: Not on file    Active member of club or organization: Not on file    Attends meetings of clubs or organizations: Not on file    Relationship status: Not on file  . Intimate partner violence    Fear of current or ex partner: Not on file    Emotionally abused: Not on file    Physically abused: Not on file    Forced sexual activity: Not on file  Other Topics Concern  . Not on file  Social History Narrative  . Not on file     PHYSICAL EXAM:  VS: Ht 5\' 2"  (1.575 m)   Wt 225 lb (102.1 kg)   BMI 41.15 kg/m  Physical Exam Gen: NAD, alert, cooperative with exam, well-appearing ENT: normal lips, normal nasal mucosa,  Eye: normal EOM, normal conjunctiva and lids CV:  no edema, +2 pedal pulses   Resp: no accessory muscle use, non-labored,  Skin: no  rashes, no areas of induration  Neuro: normal tone, normal sensation to touch Psych:  normal insight, alert and oriented MSK:  Right shoulder: Normal active flexion and abduction. Pain with external rotation and abduction. Limited full external rotation when compared to the  contralateral side. Pain with empty can testing. Positive O'Brien's test. Neurovascularly intact     ASSESSMENT & PLAN:   Tendinitis of right rotator cuff Acute in nature.  Most movements seem to be causing her pain.  May have a component of the rotator cuff as well as the capsule.  She is having intermittent cervical radiculopathy type changes as well.  MRI from last year was reassuring. -Try a sling to help rest the shoulder for a bit. -Provided a box of Duexis as a sample. -Counseled on home exercise therapy and supportive care. -Could consider injection or physical therapy or an anti-inflammatory.

## 2019-09-17 NOTE — Progress Notes (Signed)
Medication Samples have been provided to the patient.  Drug name: Duexis      Strength: 800mg /26.6mg         Qty: 1 Box  LOTRC:[id number]  Exp.Date: 09/2020  Dosing instructions: Take 1 tablet by mouth three (3) times a day.  The patient has been instructed regarding the correct time, dose, and frequency of taking this medication, including desired effects and most common side effects.   Sherrie George, MA 10:10 AM 09/17/2019

## 2019-09-17 NOTE — Assessment & Plan Note (Signed)
Acute in nature.  Most movements seem to be causing her pain.  May have a component of the rotator cuff as well as the capsule.  She is having intermittent cervical radiculopathy type changes as well.  MRI from last year was reassuring. -Try a sling to help rest the shoulder for a bit. -Provided a box of Duexis as a sample. -Counseled on home exercise therapy and supportive care. -Could consider injection or physical therapy or an anti-inflammatory.

## 2019-09-23 DIAGNOSIS — R109 Unspecified abdominal pain: Secondary | ICD-10-CM | POA: Diagnosis not present

## 2019-09-23 DIAGNOSIS — R1031 Right lower quadrant pain: Secondary | ICD-10-CM | POA: Diagnosis not present

## 2019-09-29 ENCOUNTER — Ambulatory Visit (INDEPENDENT_AMBULATORY_CARE_PROVIDER_SITE_OTHER): Payer: BC Managed Care – PPO | Admitting: Psychology

## 2019-09-29 DIAGNOSIS — F4481 Dissociative identity disorder: Secondary | ICD-10-CM | POA: Diagnosis not present

## 2019-10-07 ENCOUNTER — Ambulatory Visit: Payer: BC Managed Care – PPO | Admitting: Family Medicine

## 2019-10-07 ENCOUNTER — Other Ambulatory Visit: Payer: Self-pay

## 2019-10-07 ENCOUNTER — Encounter: Payer: Self-pay | Admitting: Family Medicine

## 2019-10-07 DIAGNOSIS — M7581 Other shoulder lesions, right shoulder: Secondary | ICD-10-CM | POA: Diagnosis not present

## 2019-10-07 DIAGNOSIS — S73192D Other sprain of left hip, subsequent encounter: Secondary | ICD-10-CM | POA: Diagnosis not present

## 2019-10-07 MED ORDER — GABAPENTIN 300 MG PO CAPS: 300.0000 mg | ORAL_CAPSULE | Freq: Three times a day (TID) | ORAL | 2 refills | Status: DC

## 2019-10-07 MED ORDER — IBUPROFEN-FAMOTIDINE 800-26.6 MG PO TABS: 1.0000 | ORAL_TABLET | Freq: Three times a day (TID) | ORAL | 3 refills | Status: DC

## 2019-10-07 NOTE — Assessment & Plan Note (Signed)
Pain is intermittent and mild.  Seems to be worse if she is more active. -Refilled gabapentin. -Could consider intra-articular injection of the hip or physical therapy.Marland Kitchen

## 2019-10-07 NOTE — Patient Instructions (Signed)
Good to see you Please continue the exercises  Please try heat or ice   Please send me a message in MyChart with any questions or updates.  Please see me back in  2 months.   --Dr. Raeford Razor

## 2019-10-07 NOTE — Progress Notes (Signed)
Miranda [name] - 43 y.o. female MRN [id number]  Date of birth: [birth year]  SUBJECTIVE:  Including CC & ROS.  Chief Complaint  Patient presents with  . Follow-up    follow up for right shoulder    Miranda [name] is a 43 y.o. female that is following up for her right shoulder pain.  She does endorse some left shoulder pain as well.  She feels the pain has gotten better and she did get improvement with the Duexis.  She notices the pain when she is in certain positions.  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  The pain is mild and intermittent.  Does not notice any numbness or radicular symptoms down her arm when she is taking the gabapentin.  She feels it does keep her at a steady level.  The hip pain has improved as well.  She does notice it from time to time especially when she is more active and busy.   Review of Systems  Constitutional: Negative for fever.  HENT: Negative for congestion.   Respiratory: Negative for cough.   Cardiovascular: Negative for chest pain.  Gastrointestinal: Negative for abdominal pain.  Musculoskeletal: Positive for arthralgias and back pain.  Skin: Negative for color change.  Neurological: Negative for weakness.  Hematological: Negative for adenopathy.    HISTORY: Past Medical, Surgical, Social, and Family History Reviewed & Updated per EMR.   Pertinent Historical Findings include:  Past Medical History:  Diagnosis Date  . Anxiety   . Arthritis   . Asthma   . Bowel obstruction ([place])   . Cardiac arrhythmia   . Depression    sees Dr. Matilde Haymaker in [place]   . Diabetes mellitus    sees Dr. Larna Daughters [place]neni   . GERD (gastroesophageal reflux disease)   . Hyperlipidemia   . Hypertension   . Hypothyroidism    sees Dr. Elyse Hsu  . Insomnia   . Migraine syndrome   . Morbid obesity ([place])   . 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 Neck pain   . Neuropathy associated with endocrine disorder ([place])   . Sleep apnea with use of  continuous positive airway pressure (CPAP)    Sleep apnea is resolved due to weight loss. 08/2017  . 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 Stroke [facility]) 04/2016    Past Surgical History:  Procedure Laterality Date  . blocked itestinal repair  88   age 73 months  . BREAST BIOPSY Left 2017  . CHOLECYSTECTOMY  2011  . INDUCED ABORTION  1996   forced abortion  . INGUINAL HERNIA REPAIR Left 1980   age 60  . LAPAROSCOPIC ENDOMETRIOSIS FULGURATION  1998  . WISDOM TOOTH EXTRACTION  2007    Allergies  Allergen Reactions  . Macrobid [Nitrofurantoin] Hives  . Metformin And Related Other (See Comments)    Abdominal cramping   . Byetta 10 Mcg Pen [Exenatide] Hives  . Clindamycin/Lincomycin Nausea And Vomiting  . Geodon [Ziprasidone Hcl]   . Lipitor [Atorvastatin]     Per patient this causes cramps  . Nsaids     Upset stomach   . Penicillins Other (See Comments)    Has patient had a PCN reaction causing immediate rash, facial/tongue/throat swelling, SOB or lightheadedness with hypotension: No Has patient had a PCN reaction causing severe rash involving mucus membranes or skin necrosis: No Has patient had a PCN reaction that required hospitalization No Has patient had a PCN reaction occurring within  the last 10 years: No If all of the above answers are "NO", then may proceed with Cephalosporin use.   Danelle Berry [Dulaglutide]     Abdominal pain   . Avocado Diarrhea and Other (See Comments)    Severe cramping, sweats, and diarrhea in upper GI/stomach  . Tramadol Hcl Itching and Rash    Family History  Adopted: Yes  Problem Relation Age of Onset  . Heart disease Other        on both sides of family  . Diabetes Other        on both sides of family     Social History   Socioeconomic History  . Marital status: Divorced    Spouse name: Not on file  . Number of children: 5  . Years of education: Not on file  . Highest education level: Not on file  Occupational History  .  Occupation: Nurse, learning disability  Social Needs  . Financial resource strain: Not on file  . Food insecurity    Worry: Not on file    Inability: Not on file  . Transportation needs    Medical: Not on file    Non-medical: Not on file  Tobacco Use  . Smoking status: Current Every Day Smoker    Packs/day: 0.50    Years: 0.50    Pack years: 0.25    Types: Cigarettes    Start date: 02/24/2018  . Smokeless tobacco: Never Used  . Tobacco comment: quit 03/25/2016  Substance and Sexual Activity  . Alcohol use: No    Alcohol/week: 0.0 standard drinks  . Drug use: Yes    Types: Marijuana    Comment: 2/10 last marijuana use  . Sexual activity: Not on file  Lifestyle  . Physical activity    Days per week: Not on file    Minutes per session: Not on file  . Stress: Not on file  Relationships  . Social Herbalist on phone: Not on file    Gets together: Not on file    Attends religious service: Not on file    Active member of club or organization: Not on file    Attends meetings of clubs or organizations: Not on file    Relationship status: Not on file  . Intimate partner violence    Fear of current or ex partner: Not on file    Emotionally abused: Not on file    Physically abused: Not on file    Forced sexual activity: Not on file  Other Topics Concern  . Not on file  Social History Narrative  . Not on file     PHYSICAL EXAM:  VS: BP 107/77   Pulse 80   Ht 5\' 2"  (1.575 m)   Wt 225 lb (102.1 kg)   BMI 41.15 kg/m  Physical Exam Gen: NAD, alert, cooperative with exam, well-appearing ENT: normal lips, normal nasal mucosa,  Eye: normal EOM, normal conjunctiva and lids CV:  no edema, +2 pedal pulses   Resp: no accessory muscle use, non-labored,  Skin: no rashes, no areas of induration  Neuro: normal tone, normal sensation to touch Psych:  normal insight, alert and oriented MSK:  Right and left shoulder:  Normal active flexion abduction. Normal internal  and external rotation. Normal grip strength. Normal strength resistance. Pain with external rotation and abduction. Pain with empty can testing. Left hip: Normal gait. No tenderness to palpation over the SI joint. Some tenderness palpation of the greater trochanter.  Normal internal and external rotation. Normal strength resistance with hip flexion Neurovascularly intact     ASSESSMENT & PLAN:   Labral tear of hip joint Pain is intermittent and mild.  Seems to be worse if she is more active. -Refilled gabapentin. -Could consider intra-articular injection of the hip or physical therapy..   Tendinitis of right rotator cuff Pain is improved for the most part.  She still notices it when she is active.  Most likely still impingement or tendinitis. -Duexis. -Counseled on home exercise therapy and supportive care. -Could consider injection.

## 2019-10-07 NOTE — Assessment & Plan Note (Signed)
Pain is improved for the most part.  She still notices it when she is active.  Most likely still impingement or tendinitis. -Duexis. -Counseled on home exercise therapy and supportive care. -Could consider injection.

## 2019-10-13 ENCOUNTER — Ambulatory Visit: Payer: BC Managed Care – PPO | Admitting: Psychology

## 2019-10-15 ENCOUNTER — Other Ambulatory Visit (INDEPENDENT_AMBULATORY_CARE_PROVIDER_SITE_OTHER): Payer: BC Managed Care – PPO

## 2019-10-15 ENCOUNTER — Encounter: Payer: Self-pay | Admitting: Gastroenterology

## 2019-10-15 ENCOUNTER — Ambulatory Visit: Payer: BC Managed Care – PPO | Admitting: Gastroenterology

## 2019-10-15 ENCOUNTER — Other Ambulatory Visit: Payer: Self-pay

## 2019-10-15 ENCOUNTER — Ambulatory Visit: Payer: BC Managed Care – PPO | Admitting: Family Medicine

## 2019-10-15 VITALS — BP 110/78 | HR 87 | Temp 97.2°F | Ht 62.0 in | Wt 228.0 lb

## 2019-10-15 DIAGNOSIS — K580 Irritable bowel syndrome with diarrhea: Secondary | ICD-10-CM | POA: Diagnosis not present

## 2019-10-15 LAB — IGA: IgA: 146 mg/dL (ref 68–378)

## 2019-10-15 LAB — TSH: TSH: 2.61 u[IU]/mL (ref 0.35–4.50)

## 2019-10-15 MED ORDER — HYOSCYAMINE SULFATE 0.125 MG SL SUBL: 0.1250 mg | SUBLINGUAL_TABLET | Freq: Four times a day (QID) | SUBLINGUAL | 1 refills | Status: DC | PRN

## 2019-10-15 MED ORDER — DIPHENOXYLATE-ATROPINE 2.5-0.025 MG PO TABS: 1.0000 | ORAL_TABLET | Freq: Four times a day (QID) | ORAL | 0 refills | Status: DC | PRN

## 2019-10-15 NOTE — Patient Instructions (Signed)
If you are age 45 or older, your body mass index should be between 23-30. Your Body mass index is 41.7 kg/m. If this is out of the aforementioned range listed, please consider follow up with your Primary Care Provider.  If you are age 29 or younger, your body mass index should be between 19-25. Your Body mass index is 41.7 kg/m. If this is out of the aformentioned range listed, please consider follow up with your Primary Care Provider.   Your provider has requested that you go to the basement level for lab work before leaving today. Press "B" on the elevator. The lab is located at the first door on the left as you exit the elevator.  1. Levsin 0.125 mg every 6 hours as needed. 2. Lomotil every 6 hours as needed.  3. Benefiber 2 tablespoons in 8 oz of liquid daily. 4. Follow up in 4-6 weeks.

## 2019-10-15 NOTE — Progress Notes (Signed)
10/15/2019 Miranda [name] [id number] 1976-10-04   HISTORY OF PRESENT ILLNESS: This is a 43 year old female who is a patient of Dr. Lynne Leader.  She has longstanding GI complaints, primarily issues with diarrhea and intermittent abdominal pain/cramping.  Is status post cholecystectomy as well.  She continues to report primarily issues with diarrhea, but recently had an episode of constipation as well.  She says that when she was able to pass the hard stool then it was followed by diarrhea.  She still has some Bentyl at home, but the medication is old.  She does not have abdominal pain on a daily basis.  She is asking for a new prescription for Lomotil to use as needed.    EGD and colonoscopy were both performed in March 2017.  Colonoscopy revealed one polyp that was removed and was a hyperplastic polyp.  Random colon biopsies were unremarkable.  Revealed LA class D esophagitis, small hiatal hernia, and a gastric nodule.  Biopsies there showed chronic gastritis, negative for H. pylori.  She is on pantoprazole 40 mg daily.  She has had multiple abdominal imaging studies over the years.   Past Medical History:  Diagnosis Date  . Anxiety   . Arthritis   . Asthma   . Bowel obstruction ([place])   . Cardiac arrhythmia   . Depression    sees Dr. Matilde Haymaker in [place]   . Diabetes mellitus    sees Dr. Larna Daughters [place]   . GERD (gastroesophageal reflux disease)   . Hyperlipidemia   . Hypertension   . Hypothyroidism    sees Dr. Elyse Hsu  . Insomnia   . Migraine syndrome   . Morbid obesity ([place])   . 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 Neck pain   . Neuropathy associated with endocrine disorder ([place])   . Sleep apnea with use of continuous positive airway pressure (CPAP)    Sleep apnea is resolved due to weight loss. 08/2017  . 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 Stroke [facility]) 04/2016   Past Surgical History:  Procedure Laterality Date  . blocked itestinal repair  2   age 24 months    . BREAST BIOPSY Left 2017  . CHOLECYSTECTOMY  2011  . INDUCED ABORTION  1996   forced abortion  . INGUINAL HERNIA REPAIR Left 1980   age 39  . LAPAROSCOPIC ENDOMETRIOSIS FULGURATION  1998  . WISDOM TOOTH EXTRACTION  2007    reports that she has been smoking cigarettes. She started smoking about 19 months ago. She has a 0.25 pack-year smoking history. She has never used smokeless tobacco. She reports current drug use. Drug: Marijuana. She reports that she does not drink alcohol. family history includes Diabetes in an other family member; Heart disease in an other family member. She was adopted. Allergies  Allergen Reactions  . Macrobid [Nitrofurantoin] Hives  . Metformin And Related Other (See Comments)    Abdominal cramping   . Byetta 10 Mcg Pen [Exenatide] Hives  . Clindamycin/Lincomycin Nausea And Vomiting  . Geodon [Ziprasidone Hcl]   . Lipitor [Atorvastatin]     Per patient this causes cramps  . Nsaids     Upset stomach   . Penicillins Other (See Comments)    Has patient had a PCN reaction causing immediate rash, facial/tongue/throat swelling, SOB or lightheadedness with hypotension: No Has patient had a PCN reaction causing severe rash involving mucus membranes or skin necrosis: No Has patient had a PCN reaction that required hospitalization  No Has patient had a PCN reaction occurring within the last 10 years: No If all of the above answers are "NO", then may proceed with Cephalosporin use.   Danelle Berry [Dulaglutide]     Abdominal pain   . Avocado Diarrhea and Other (See Comments)    Severe cramping, sweats, and diarrhea in upper GI/stomach  . Tramadol Hcl Itching and Rash      Outpatient Encounter Medications as of 10/15/2019  Medication Sig  . albuterol (PROAIR HFA) 108 (90 Base) MCG/ACT inhaler Inhale 2 puffs into the lungs every 4 (four) hours as needed for wheezing.  . ARIPiprazole (ABILIFY) 30 MG tablet Take 30 mg by mouth daily.  . budesonide-formoterol  (SYMBICORT) 160-4.5 MCG/ACT inhaler Inhale 2 puffs into the lungs 2 (two) times daily.  Marland Kitchen buPROPion (WELLBUTRIN XL) 150 MG 24 hr tablet   . clopidogrel (PLAVIX) 75 MG tablet TAKE ONE TABLET BY MOUTH DAILY  . gabapentin (NEURONTIN) 300 MG capsule Take 1 capsule (300 mg total) by mouth 3 (three) times daily.  . hydrOXYzine (ATARAX/VISTARIL) 50 MG tablet Take 50 mg by mouth. Take 1 tablet every night and may take 3 other times as needed  . Ibuprofen-Famotidine 800-26.6 MG TABS Take 1 tablet by mouth 3 (three) times daily.  . insulin aspart (NOVOLOG) 100 UNIT/ML injection Use in the insulin pump as directed  . levothyroxine (SYNTHROID, LEVOTHROID) 125 MCG tablet Take 1 tablet (125 mcg total) by mouth daily before breakfast.  . metoprolol succinate (TOPROL-XL) 25 MG 24 hr tablet Take 0.5 tablets (12.5 mg total) by mouth daily.  . Multiple Vitamin (MULTIVITAMIN) tablet Take 1 tablet by mouth daily.  . norethindrone (MICRONOR,CAMILA,ERRIN) 0.35 MG tablet   . NYAMYC powder APPLY TO AFFECTED AREA(S) FOUR TIMES A DAY  . pantoprazole (PROTONIX) 40 MG tablet TAKE ONE TABLET BY MOUTH DAILY  . prazosin (MINIPRESS) 5 MG capsule Take 10 mg by mouth at bedtime.  . promethazine (PHENERGAN) 25 MG tablet Take 1 tablet (25 mg total) by mouth every 4 (four) hours as needed for nausea or vomiting.  . rosuvastatin (CRESTOR) 20 MG tablet Take 1 tablet (20 mg total) by mouth daily.  Marland Kitchen tiZANidine (ZANAFLEX) 2 MG tablet TAKE ONE TABLET BY MOUTH EVERY 8 HOURS AS NEEDED FOR MUSCLE SPASMS  . topiramate (TOPAMAX) 100 MG tablet Take 100 mg by mouth 2 (two) times daily.   . traZODone (DESYREL) 150 MG tablet Take by mouth at bedtime.  . TRESIBA FLEXTOUCH 100 UNIT/ML SOPN FlexTouch Pen   . valACYclovir (VALTREX) 1000 MG tablet Take 1 tablet (1,000 mg total) by mouth 3 (three) times daily. (Patient taking differently: Take 1,000 mg by mouth 3 (three) times daily as needed. )  . [DISCONTINUED] insulin lispro (HUMALOG) 100 UNIT/ML  injection ADM 40 UNI [place] D  . [DISCONTINUED] predniSONE (DELTASONE) 5 MG tablet Take 6 pills for first day, 5 pills second day, 4 pills third day, 3 pills fourth day, 2 pills the fifth day, and 1 pill sixth day.  . [DISCONTINUED] promethazine (PHENERGAN) 25 MG tablet Take 1 tablet (25 mg total) every 6 (six) hours as needed by mouth for nausea or vomiting.  . [DISCONTINUED] rizatriptan (MAXALT) 10 MG tablet Take 1 tablet (10 mg total) by mouth as needed for migraine. May repeat in 2 hours if needed   No facility-administered encounter medications on file as of 10/15/2019.     REVIEW OF SYSTEMS  : All other systems reviewed and negative except where noted in the History  of Present Illness.   PHYSICAL EXAM: BP 110/78   Pulse 87   Temp (!) 97.2 F (36.2 C)   Ht 5\' 2"  (1.575 m)   Wt 228 lb (103.4 kg)   BMI 41.70 kg/m  General: Well developed white female in no acute distress Head: Normocephalic and atraumatic Eyes:  Sclerae anicteric, conjunctiva pink. Ears: Normal auditory acuity Lungs: Clear throughout to auscultation; CTAB.  No increased WOB. Heart: Regular rate and rhythm; no M/R/G. Abdomen: Soft, non-distended.  BS present.  Non-tender. Musculoskeletal: Symmetrical with no gross deformities  Skin: No lesions on visible extremities Extremities: No edema  Neurological: Alert oriented x 4, grossly non-focal Psychological:  Alert and cooperative. Normal mood and affect  ASSESSMENT AND PLAN: *43 year old female with longstanding IBS, predominant with occasional episodes of constipation.  She is status post cholecystectomy as well so that could be contributing to some of her diarrhea symptoms.  Will check celiac labs today as I am not sure that those were ever done in the past as part of her evaluation.  We will check updated TSH today.  We discussed taking powder fiber supplement in the form of Benefiber, 2 tablespoons mixed in 8 ounces of liquid daily.  This will help to bulk her  stools.  Can use Lomotil as needed.  Prescription sent.  We will try Levsin as needed for antispasmodic.  Prescription sent. *IDDM *Hypothyroidism:  On synthroid.  TSH not checked in a while.  Will check it today. *History of CVA:  On Plavix. 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  **She will follow-up in 4 to 6 weeks.  Could consider testing for SIBO or even treating her empirically with a course of Xifaxan with her IBS-D.   CC:  Laurey Morale, MD

## 2019-10-16 DIAGNOSIS — B373 Candidiasis of vulva and vagina: Secondary | ICD-10-CM | POA: Diagnosis not present

## 2019-10-16 DIAGNOSIS — K580 Irritable bowel syndrome with diarrhea: Secondary | ICD-10-CM | POA: Insufficient documentation

## 2019-10-16 DIAGNOSIS — R300 Dysuria: Secondary | ICD-10-CM | POA: Diagnosis not present

## 2019-10-16 DIAGNOSIS — N898 Other specified noninflammatory disorders of vagina: Secondary | ICD-10-CM | POA: Diagnosis not present

## 2019-10-16 DIAGNOSIS — R399 Unspecified symptoms and signs involving the genitourinary system: Secondary | ICD-10-CM | POA: Diagnosis not present

## 2019-10-16 DIAGNOSIS — Z113 Encounter for screening for infections with a predominantly sexual mode of transmission: Secondary | ICD-10-CM | POA: Diagnosis not present

## 2019-10-16 NOTE — Progress Notes (Signed)
Reviewed and agree with management plan.  Clementina Mareno T. Gennifer Potenza, MD FACG [facility]  

## 2019-10-17 LAB — TISSUE TRANSGLUTAMINASE ABS,IGG,IGA
(tTG) Ab, IgA: 1 U/mL
(tTG) Ab, IgG: 1 U/mL

## 2019-10-27 ENCOUNTER — Ambulatory Visit: Payer: BC Managed Care – PPO | Admitting: Psychology

## 2019-10-27 ENCOUNTER — Other Ambulatory Visit: Payer: Self-pay

## 2019-10-27 ENCOUNTER — Ambulatory Visit: Payer: BC Managed Care – PPO | Admitting: Family Medicine

## 2019-10-27 VITALS — BP 99/64 | Ht 61.75 in | Wt 228.0 lb

## 2019-10-27 DIAGNOSIS — M25512 Pain in left shoulder: Secondary | ICD-10-CM

## 2019-10-27 NOTE — Patient Instructions (Signed)
Your exam is reassuring. You've strained your rotator cuff. Ice the area 15 minutes at a time 3-4 times a day. Continue your duexis as you have been. We could consider a steroid injection or formal physical therapy if you're not improving. Let me know how you're doing over the next couple weeks.

## 2019-10-28 ENCOUNTER — Encounter: Payer: Self-pay | Admitting: Family Medicine

## 2019-10-28 NOTE — Progress Notes (Signed)
PCP: Laurey Morale, MD  Subjective:   HPI: Patient is a 43 y.o. female here for left shoulder/arm pain.  Patient denies known injury. She states she started to get pain in left shoulder, upper arm laterally last week that was severe on Wednesday. Pain up to 10/10 and tearful due to level of pain back on Wednesday. Worse reaching. Had been wrapping presents but uncertain if this is related. She had some warmth to the touch back when this was bad at the end of last week. No obvious swelling but upper arm feels swollen. No skin changes.  Past Medical History:  Diagnosis Date  . Anxiety   . Arthritis   . Asthma   . Bowel obstruction ([place])   . Cardiac arrhythmia   . Depression    sees Dr. Matilde Haymaker in [place]   . Diabetes mellitus    sees Dr. Larna Daughters [place]   . GERD (gastroesophageal reflux disease)   . Hyperlipidemia   . Hypertension   . Hypothyroidism    sees Dr. Elyse Hsu  . Insomnia   . Migraine syndrome   . Morbid obesity ([place])   . 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 Neck pain   . Neuropathy associated with endocrine disorder ([place])   . Sleep apnea with use of continuous positive airway pressure (CPAP)    Sleep apnea is resolved due to weight loss. 08/2017  . 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 Stroke [facility]) 04/2016    Current Outpatient Medications on File Prior to Visit  Medication Sig Dispense Refill  . albuterol (PROAIR HFA) 108 (90 Base) MCG/ACT inhaler Inhale 2 puffs into the lungs every 4 (four) hours as needed for wheezing. 8.5 g 11  . ARIPiprazole (ABILIFY) 30 MG tablet Take 30 mg by mouth daily.    . budesonide-formoterol (SYMBICORT) 160-4.5 MCG/ACT inhaler Inhale 2 puffs into the lungs 2 (two) times daily. 1 Inhaler 11  . buPROPion (WELLBUTRIN XL) 150 MG 24 hr tablet     . clopidogrel (PLAVIX) 75 MG tablet TAKE ONE TABLET BY MOUTH DAILY 90 tablet 0  . diphenoxylate-atropine (LOMOTIL) 2.5-0.025 MG tablet Take 1 tablet by mouth every 6 (six) hours as  needed for diarrhea or loose stools. 30 tablet 0  . gabapentin (NEURONTIN) 300 MG capsule Take 1 capsule (300 mg total) by mouth 3 (three) times daily. 90 capsule 2  . hydrOXYzine (ATARAX/VISTARIL) 50 MG tablet Take 50 mg by mouth. Take 1 tablet every night and may take 3 other times as needed    . hyoscyamine (LEVSIN SL) 0.125 MG SL tablet Place 1 tablet (0.125 mg total) under the tongue every 6 (six) hours as needed. 30 tablet 1  . Ibuprofen-Famotidine 800-26.6 MG TABS Take 1 tablet by mouth 3 (three) times daily. 90 tablet 3  . insulin aspart (NOVOLOG) 100 UNIT/ML injection Use in the insulin pump as directed 10 mL 0  . levothyroxine (SYNTHROID, LEVOTHROID) 125 MCG tablet Take 1 tablet (125 mcg total) by mouth daily before breakfast. 90 tablet 3  . metoprolol succinate (TOPROL-XL) 25 MG 24 hr tablet Take 0.5 tablets (12.5 mg total) by mouth daily. 30 tablet 0  . Multiple Vitamin (MULTIVITAMIN) tablet Take 1 tablet by mouth daily.    . norethindrone (MICRONOR,CAMILA,ERRIN) 0.35 MG tablet     . NYAMYC powder APPLY TO AFFECTED AREA(S) FOUR TIMES A DAY 15 g 4  . pantoprazole (PROTONIX) 40 MG tablet TAKE ONE TABLET BY MOUTH DAILY 30 tablet 1  .  prazosin (MINIPRESS) 5 MG capsule Take 10 mg by mouth at bedtime.    . promethazine (PHENERGAN) 25 MG tablet Take 1 tablet (25 mg total) by mouth every 4 (four) hours as needed for nausea or vomiting. 60 tablet 1  . rosuvastatin (CRESTOR) 20 MG tablet Take 1 tablet (20 mg total) by mouth daily. 30 tablet 6  . tiZANidine (ZANAFLEX) 2 MG tablet TAKE ONE TABLET BY MOUTH EVERY 8 HOURS AS NEEDED FOR MUSCLE SPASMS 60 tablet 1  . topiramate (TOPAMAX) 100 MG tablet Take 100 mg by mouth 2 (two) times daily.     . traZODone (DESYREL) 150 MG tablet Take by mouth at bedtime.    . TRESIBA FLEXTOUCH 100 UNIT/ML SOPN FlexTouch Pen     . valACYclovir (VALTREX) 1000 MG tablet Take 1 tablet (1,000 mg total) by mouth 3 (three) times daily. (Patient taking differently: Take  1,000 mg by mouth 3 (three) times daily as needed. ) 21 tablet 5   No current facility-administered medications on file prior to visit.    Past Surgical History:  Procedure Laterality Date  . blocked itestinal repair  19   age 70 months  . BREAST BIOPSY Left 2017  . CHOLECYSTECTOMY  2011  . INDUCED ABORTION  1996   forced abortion  . INGUINAL HERNIA REPAIR Left 1980   age 87  . LAPAROSCOPIC ENDOMETRIOSIS FULGURATION  1998  . WISDOM TOOTH EXTRACTION  2007    Allergies  Allergen Reactions  . Macrobid [Nitrofurantoin] Hives  . Metformin And Related Other (See Comments)    Abdominal cramping   . Byetta 10 Mcg Pen [Exenatide] Hives  . Clindamycin/Lincomycin Nausea And Vomiting  . Geodon [Ziprasidone Hcl]   . Lipitor [Atorvastatin]     Per patient this causes cramps  . Nsaids     Upset stomach   . Penicillins Other (See Comments)    Has patient had a PCN reaction causing immediate rash, facial/tongue/throat swelling, SOB or lightheadedness with hypotension: No Has patient had a PCN reaction causing severe rash involving mucus membranes or skin necrosis: No Has patient had a PCN reaction that required hospitalization No Has patient had a PCN reaction occurring within the last 10 years: No If all of the above answers are "NO", then may proceed with Cephalosporin use.   Danelle Berry [Dulaglutide]     Abdominal pain   . Avocado Diarrhea and Other (See Comments)    Severe cramping, sweats, and diarrhea in upper GI/stomach  . Tramadol Hcl Itching and Rash    Social History   Socioeconomic History  . Marital status: Divorced    Spouse name: Not on file  . Number of children: 5  . Years of education: Not on file  . Highest education level: Not on file  Occupational History  . Occupation: Nurse, learning disability  Tobacco Use  . Smoking status: Current Every Day Smoker    Packs/day: 0.50    Years: 0.50    Pack years: 0.25    Types: Cigarettes    Start date:  02/24/2018  . Smokeless tobacco: Never Used  . Tobacco comment: has decreased how much - trying to quit  Substance and Sexual Activity  . Alcohol use: No    Alcohol/week: 0.0 standard drinks  . Drug use: Yes    Types: Marijuana    Comment: 2/10 last marijuana use  . Sexual activity: Not on file  Other Topics Concern  . Not on file  Social History Narrative  . Not  on file   Social Determinants of Health   Financial Resource Strain:   . Difficulty of Paying Living Expenses: Not on file  Food Insecurity:   . Worried About Charity fundraiser in the Last Year: Not on file  . Ran Out of Food in the Last Year: Not on file  Transportation Needs:   . Lack of Transportation (Medical): Not on file  . Lack of Transportation (Non-Medical): Not on file  Physical Activity:   . Days of Exercise per Week: Not on file  . Minutes of Exercise per Session: Not on file  Stress:   . Feeling of Stress : Not on file  Social Connections:   . Frequency of Communication with Friends and Family: Not on file  . Frequency of Social Gatherings with Friends and Family: Not on file  . Attends Religious Services: Not on file  . Active Member of Clubs or Organizations: Not on file  . Attends Archivist Meetings: Not on file  . Marital Status: Not on file  Intimate Partner Violence:   . Fear of Current or Ex-Partner: Not on file  . Emotionally Abused: Not on file  . Physically Abused: Not on file  . Sexually Abused: Not on file    Family History  Adopted: Yes  Problem Relation Age of Onset  . Heart disease Other        on both sides of family  . Diabetes Other        on both sides of family    BP 99/64   Ht 5' 1.75" (1.568 m)   Wt 228 lb (103.4 kg)   BMI 42.04 kg/m   Review of Systems: See HPI above.     Objective:  Physical Exam:  Gen: NAD, comfortable in exam room  Left shoulder: No swelling, ecchymoses.  No gross deformity. No TTP. FROM with minimal painful  arc. Negative Hawkins, Neers. Negative Yergasons. Strength 5/5 with empty can and resisted internal/external rotation.  Pain primarily with empty can. Negative apprehension. NV intact distally.  Right shoulder: No swelling, ecchymoses.  No gross deformity. No TTP. FROM. Strength 5/5 with empty can and resisted internal/external rotation. NV intact distally.   Assessment & Plan:  1. Left shoulder pain - consistent with overuse rotator cuff strain, specifically supraspinatus.  Discussed options - she is improving - advised to continue with duexis for now.  Consider home exercise program with yellow theraband or physical therapy if she doesn't continue to improve.  Consider subacromial injection as well.  Let us know how she's doing in a couple weeks.

## 2019-11-03 ENCOUNTER — Telehealth: Payer: Self-pay | Admitting: Family Medicine

## 2019-11-03 DIAGNOSIS — E1140 Type 2 diabetes mellitus with diabetic neuropathy, unspecified: Secondary | ICD-10-CM

## 2019-11-03 DIAGNOSIS — E039 Hypothyroidism, unspecified: Secondary | ICD-10-CM

## 2019-11-03 NOTE — Telephone Encounter (Signed)
Is this for diabetes or hypothyroidism, or both? Does she want to stop seeing her current providers?

## 2019-11-03 NOTE — Telephone Encounter (Signed)
Ok for referral?

## 2019-11-03 NOTE — Telephone Encounter (Signed)
See request °

## 2019-11-03 NOTE — Telephone Encounter (Signed)
Patient is calling to request a referral to Endo.  Please Advise Miranda [name], ANP [facility] [id number] [place], [place] [address] [phone]

## 2019-11-04 ENCOUNTER — Ambulatory Visit: Payer: BC Managed Care – PPO | Admitting: Gastroenterology

## 2019-11-04 NOTE — Telephone Encounter (Signed)
Referral if for both diabetes and hypothyroidism. She would like to stop seeing the current providers and go somewhere else.

## 2019-11-05 NOTE — Telephone Encounter (Signed)
Noted. Patient is aware. 

## 2019-11-05 NOTE — Telephone Encounter (Signed)
The referral was done  

## 2019-11-11 DIAGNOSIS — E039 Hypothyroidism, unspecified: Secondary | ICD-10-CM | POA: Diagnosis not present

## 2019-11-11 DIAGNOSIS — Z794 Long term (current) use of insulin: Secondary | ICD-10-CM | POA: Diagnosis not present

## 2019-11-11 DIAGNOSIS — E1165 Type 2 diabetes mellitus with hyperglycemia: Secondary | ICD-10-CM | POA: Diagnosis not present

## 2019-11-13 DIAGNOSIS — E1165 Type 2 diabetes mellitus with hyperglycemia: Secondary | ICD-10-CM | POA: Diagnosis not present

## 2019-11-13 DIAGNOSIS — Z794 Long term (current) use of insulin: Secondary | ICD-10-CM | POA: Diagnosis not present

## 2019-11-16 ENCOUNTER — Other Ambulatory Visit: Payer: Self-pay | Admitting: Family Medicine

## 2019-11-19 DIAGNOSIS — H35413 Lattice degeneration of retina, bilateral: Secondary | ICD-10-CM | POA: Diagnosis not present

## 2019-11-19 DIAGNOSIS — H25013 Cortical age-related cataract, bilateral: Secondary | ICD-10-CM | POA: Diagnosis not present

## 2019-11-19 DIAGNOSIS — H35371 Puckering of macula, right eye: Secondary | ICD-10-CM | POA: Diagnosis not present

## 2019-11-19 DIAGNOSIS — H2513 Age-related nuclear cataract, bilateral: Secondary | ICD-10-CM | POA: Diagnosis not present

## 2019-11-19 DIAGNOSIS — E119 Type 2 diabetes mellitus without complications: Secondary | ICD-10-CM | POA: Diagnosis not present

## 2019-11-22 DIAGNOSIS — I639 Cerebral infarction, unspecified: Secondary | ICD-10-CM | POA: Diagnosis not present

## 2019-11-24 ENCOUNTER — Ambulatory Visit (INDEPENDENT_AMBULATORY_CARE_PROVIDER_SITE_OTHER): Payer: BC Managed Care – PPO | Admitting: Psychology

## 2019-11-24 DIAGNOSIS — G4733 Obstructive sleep apnea (adult) (pediatric): Secondary | ICD-10-CM | POA: Diagnosis not present

## 2019-11-24 DIAGNOSIS — Z8673 Personal history of transient ischemic attack (TIA), and cerebral infarction without residual deficits: Secondary | ICD-10-CM | POA: Diagnosis not present

## 2019-11-24 DIAGNOSIS — G43909 Migraine, unspecified, not intractable, without status migrainosus: Secondary | ICD-10-CM | POA: Diagnosis not present

## 2019-11-24 DIAGNOSIS — I639 Cerebral infarction, unspecified: Secondary | ICD-10-CM | POA: Diagnosis not present

## 2019-11-24 DIAGNOSIS — F4481 Dissociative identity disorder: Secondary | ICD-10-CM

## 2019-11-25 ENCOUNTER — Ambulatory Visit: Payer: Self-pay | Admitting: Gastroenterology

## 2019-11-25 DIAGNOSIS — Z20828 Contact with and (suspected) exposure to other viral communicable diseases: Secondary | ICD-10-CM | POA: Diagnosis not present

## 2019-12-08 ENCOUNTER — Telehealth: Payer: Self-pay | Admitting: Family Medicine

## 2019-12-08 ENCOUNTER — Ambulatory Visit (INDEPENDENT_AMBULATORY_CARE_PROVIDER_SITE_OTHER): Payer: BC Managed Care – PPO | Admitting: Psychology

## 2019-12-08 DIAGNOSIS — F4481 Dissociative identity disorder: Secondary | ICD-10-CM | POA: Diagnosis not present

## 2019-12-08 DIAGNOSIS — F4312 Post-traumatic stress disorder, chronic: Secondary | ICD-10-CM

## 2019-12-08 NOTE — Telephone Encounter (Signed)
Pt wants to be sure she is suppose to be taking the pantoprazole twice a day. She prefers to take it twice a day b/c it helps but the bottle states once a day.  She stated that if she does need to take it twice a day the bottle only last half a month.  Pt can be reached at [phone]

## 2019-12-09 ENCOUNTER — Ambulatory Visit: Payer: BC Managed Care – PPO | Admitting: Family Medicine

## 2019-12-09 MED ORDER — PANTOPRAZOLE SODIUM 40 MG PO TBEC: 40.0000 mg | DELAYED_RELEASE_TABLET | Freq: Two times a day (BID) | ORAL | 11 refills | Status: DC

## 2019-12-09 NOTE — Telephone Encounter (Signed)
Noted. Patient is aware. 

## 2019-12-09 NOTE — Telephone Encounter (Signed)
Sent in a new rx for BID

## 2019-12-09 NOTE — Progress Notes (Deleted)
Miranda [name] - 44 y.o. female MRN [id number]  Date of birth: 01-Nov-1975  SUBJECTIVE:  Including CC & ROS.  No chief complaint on file.   Miranda [name] is a 44 y.o. female that is  ***.  ***   Review of Systems See HPI   HISTORY: Past Medical, Surgical, Social, and Family History Reviewed & Updated per EMR.   Pertinent Historical Findings include:  Past Medical History:  Diagnosis Date  . Anxiety   . Arthritis   . Asthma   . Bowel obstruction ([place])   . Cardiac arrhythmia   . Depression    sees Dr. Matilde Haymaker in [place]   . Diabetes mellitus    sees Dr. Larna Daughters Averneni   . GERD (gastroesophageal reflux disease)   . Hyperlipidemia   . Hypertension   . Hypothyroidism    sees Dr. Elyse Hsu  . Insomnia   . Migraine syndrome   . Morbid obesity ([place])   . 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 Neck pain   . Neuropathy associated with endocrine disorder ([place])   . Sleep apnea with use of continuous positive airway pressure (CPAP)    Sleep apnea is resolved due to weight loss. 08/2017  . 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 Stroke [facility]) 04/2016    Past Surgical History:  Procedure Laterality Date  . blocked itestinal repair  73   age 2 months  . BREAST BIOPSY Left 2017  . CHOLECYSTECTOMY  2011  . INDUCED ABORTION  1996   forced abortion  . INGUINAL HERNIA REPAIR Left 1980   age 48  . LAPAROSCOPIC ENDOMETRIOSIS FULGURATION  1998  . WISDOM TOOTH EXTRACTION  2007    Allergies  Allergen Reactions  . Macrobid [Nitrofurantoin] Hives  . Metformin And Related Other (See Comments)    Abdominal cramping   . Byetta 10 Mcg Pen [Exenatide] Hives  . Clindamycin/Lincomycin Nausea And Vomiting  . Geodon [Ziprasidone Hcl]   . Lipitor [Atorvastatin]     Per patient this causes cramps  . Nsaids     Upset stomach   . Penicillins Other (See Comments)    Has patient had a PCN reaction causing immediate rash, facial/tongue/throat swelling, SOB or lightheadedness with  hypotension: No Has patient had a PCN reaction causing severe rash involving mucus membranes or skin necrosis: No Has patient had a PCN reaction that required hospitalization No Has patient had a PCN reaction occurring within the last 10 years: No If all of the above answers are "NO", then may proceed with Cephalosporin use.   Danelle Berry [Dulaglutide]     Abdominal pain   . Avocado Diarrhea and Other (See Comments)    Severe cramping, sweats, and diarrhea in upper GI/stomach  . Tramadol Hcl Itching and Rash    Family History  Adopted: Yes  Problem Relation Age of Onset  . Heart disease Other        on both sides of family  . Diabetes Other        on both sides of family     Social History   Socioeconomic History  . Marital status: Divorced    Spouse name: Not on file  . Number of children: 5  . Years of education: Not on file  . Highest education level: Not on file  Occupational History  . Occupation: Nurse, learning disability  Tobacco Use  . Smoking status: Current Every Day Smoker    Packs/day: 0.50    Years:  0.50    Pack years: 0.25    Types: Cigarettes    Start date: 02/24/2018  . Smokeless tobacco: Never Used  . Tobacco comment: has decreased how much - trying to quit  Substance and Sexual Activity  . Alcohol use: No    Alcohol/week: 0.0 standard drinks  . Drug use: Yes    Types: Marijuana    Comment: 2/10 last marijuana use  . Sexual activity: Not on file  Other Topics Concern  . Not on file  Social History Narrative  . Not on file   Social Determinants of Health   Financial Resource Strain:   . Difficulty of Paying Living Expenses: Not on file  Food Insecurity:   . Worried About Charity fundraiser in the Last Year: Not on file  . Ran Out of Food in the Last Year: Not on file  Transportation Needs:   . Lack of Transportation (Medical): Not on file  . Lack of Transportation (Non-Medical): Not on file  Physical Activity:   . Days of Exercise  per Week: Not on file  . Minutes of Exercise per Session: Not on file  Stress:   . Feeling of Stress : Not on file  Social Connections:   . Frequency of Communication with Friends and Family: Not on file  . Frequency of Social Gatherings with Friends and Family: Not on file  . Attends Religious Services: Not on file  . Active Member of Clubs or Organizations: Not on file  . Attends Archivist Meetings: Not on file  . Marital Status: Not on file  Intimate Partner Violence:   . Fear of Current or Ex-Partner: Not on file  . Emotionally Abused: Not on file  . Physically Abused: Not on file  . Sexually Abused: Not on file     PHYSICAL EXAM:  VS: There were no vitals taken for this visit. Physical Exam Gen: NAD, alert, cooperative with exam, well-appearing ENT: normal lips, normal nasal mucosa,  Eye: normal EOM, normal conjunctiva and lids Skin: no rashes, no areas of induration  Neuro: normal tone, normal sensation to touch Psych:  normal insight, alert and oriented MSK:  ***      ASSESSMENT & PLAN:   No problem-specific Assessment & Plan notes found for this encounter.

## 2019-12-10 ENCOUNTER — Ambulatory Visit: Payer: BC Managed Care – PPO | Admitting: Family Medicine

## 2019-12-10 ENCOUNTER — Other Ambulatory Visit: Payer: Self-pay

## 2019-12-10 DIAGNOSIS — M25561 Pain in right knee: Secondary | ICD-10-CM | POA: Diagnosis not present

## 2019-12-10 DIAGNOSIS — M25562 Pain in left knee: Secondary | ICD-10-CM

## 2019-12-10 MED ORDER — METHYLPREDNISOLONE ACETATE 40 MG/ML IJ SUSP
40.0000 mg | Freq: Once | INTRAMUSCULAR | Status: AC
  Administered 2019-12-10: 17:00:00 40 mg via INTRA_ARTICULAR

## 2019-12-10 MED ORDER — METHYLPREDNISOLONE ACETATE 40 MG/ML IJ SUSP
40.0000 mg | Freq: Once | INTRAMUSCULAR | Status: AC
  Administered 2019-12-10: 40 mg via INTRA_ARTICULAR

## 2019-12-10 NOTE — Assessment & Plan Note (Signed)
Bilateral knee pain 2/2 chronic osteoarthritis of knees bilaterally. Patient advised for knee replacement but unable to do so 2/2 work schedule. Patient opted to have steroid injections today to provide some relief of pain. Will plan to start authorization process for gel injections as well as patient had relief with this method of treatment for 6 months. Will call to schedule these.   After written informed consent was signed with explanation of risks and benefits explained to Miranda [name], patient chose to continue with steroid injections of knees bilaterally. Patient was seated on exam table. Overlying skin of knee cleaned with betadine and alcohol wipes using ethal chloride as topical anesthetic. A steroid injection was performed at  using 3 cc bupivocaine and 1 cc of 40 mg of depomedrol on a 22 gauge 1.5 needle. This was well tolerated. No bleeding afterwards. Injections bilaterally performed by Dr. Barbaraann Barthel

## 2019-12-10 NOTE — Patient Instructions (Signed)
You were given steroid injections into both of your knees today. We will get approval for the gel shots - call us if/when you want to go ahead with these. Tylenol, topical medications, heat as needed. Follow up with me as needed otherwise.

## 2019-12-10 NOTE — Progress Notes (Signed)
   Miranda [name] is a 44 y.o. female who presents to [facility] today for the following:  Bilateral knee pain Patient presenting today for bilateral knee pain. States that pain is chronic but worsened 4-5 days ago. States that she has been seen in the past with Dr. Barbaraann Barthel and had great success with gel injections. States that after the 4th injection she had resolution of pain till recently. Pain is worse when she steps down onto her leg. Pain is also worse when she walks up stairs. States that knees feel more stiff, especially the backs of her knees. Knees feel "lumpy".    PMH reviewed. CVA, HTN, DM, hypothyroidism, de quervain's disease, labral tear of hip joint, plantar fasciitis, tendinitis of right rotator cuff ROS as above. Medications reviewed.  Exam:  BP 124/86   Ht 5\' 2"  (1.575 m)   Wt 230 lb (104.3 kg)   BMI 42.07 kg/m  Gen: Well NAD MSK:  Left Knee: - Inspection: no gross deformity. +swelling, no erythema or bruising. Skin intact - Palpation: TTP of medial > lateral joint line  - ROM: full active ROM with flexion and extension in knee and hip - Strength: 5/5 strength - Neuro/vasc: NV intact - Special Tests: - LIGAMENTS: negative anterior and posterior drawer, negative Lachman's, no MCL or LCL laxity  -- MENISCUS: positive McMurray's  -- PF JOINT: nml patellar mobility bilaterally.  negative patellar grind   Knee: - Inspection: no gross deformity. +swelling, no erythema or bruising. Skin intact - Palpation: TTP of medial > lateral joint line  - ROM: full active ROM with flexion and extension in knee and hip - Strength: 5/5 strength - Neuro/vasc: NV intact - Special Tests: - LIGAMENTS: negative anterior and posterior drawer, negative Lachman's, no MCL or LCL laxity  -- MENISCUS: positive McMurray's on right  -- PF JOINT: nml patellar mobility bilaterally.  negative patellar grind   Hips: normal ROM, negative FABER and FADIR bilaterally  Assessment and Plan: 1) KNEE  PAIN Bilateral knee pain 2/2 chronic osteoarthritis of knees bilaterally. Patient advised for knee replacement but unable to do so 2/2 work schedule. Patient opted to have steroid injections today to provide some relief of pain. Will plan to start authorization process for gel injections as well as patient had relief with this method of treatment for over 6 months. Will call to schedule these.   After informed written consent timeout was performed, patient was seated on exam table. Right knee was prepped with alcohol swab and utilizing anteromedial approach, patient's right knee was injected intraarticularly with 3:1 bupivicaine: depomedrol. Patient tolerated the procedure well without immediate complications.  After informed written consent timeout was performed, patient was seated on exam table. Left knee was prepped with alcohol swab and utilizing anteromedial approach, patient's left knee was injected intraarticularly with 3:1 bupivicaine: depomedrol. Patient tolerated the procedure well without immediate complications.  Dalphine Handing, PGY-3 [facility] 12/10/2019 4:49 PM

## 2019-12-11 ENCOUNTER — Encounter: Payer: Self-pay | Admitting: Family Medicine

## 2019-12-11 MED ORDER — HYDROCODONE-ACETAMINOPHEN 5-325 MG PO TABS: 1.0000 | ORAL_TABLET | Freq: Four times a day (QID) | ORAL | 0 refills | Status: DC | PRN

## 2019-12-22 ENCOUNTER — Ambulatory Visit (INDEPENDENT_AMBULATORY_CARE_PROVIDER_SITE_OTHER): Payer: BC Managed Care – PPO | Admitting: Psychology

## 2019-12-22 DIAGNOSIS — F4481 Dissociative identity disorder: Secondary | ICD-10-CM

## 2019-12-28 DIAGNOSIS — E1165 Type 2 diabetes mellitus with hyperglycemia: Secondary | ICD-10-CM | POA: Diagnosis not present

## 2020-01-01 DIAGNOSIS — Z794 Long term (current) use of insulin: Secondary | ICD-10-CM | POA: Diagnosis not present

## 2020-01-01 DIAGNOSIS — E1165 Type 2 diabetes mellitus with hyperglycemia: Secondary | ICD-10-CM | POA: Diagnosis not present

## 2020-01-01 DIAGNOSIS — Z6841 Body Mass Index (BMI) 40.0 and over, adult: Secondary | ICD-10-CM | POA: Diagnosis not present

## 2020-01-05 ENCOUNTER — Ambulatory Visit (INDEPENDENT_AMBULATORY_CARE_PROVIDER_SITE_OTHER): Payer: BC Managed Care – PPO | Admitting: Psychology

## 2020-01-05 DIAGNOSIS — F4312 Post-traumatic stress disorder, chronic: Secondary | ICD-10-CM | POA: Diagnosis not present

## 2020-01-05 DIAGNOSIS — F4481 Dissociative identity disorder: Secondary | ICD-10-CM | POA: Diagnosis not present

## 2020-01-06 DIAGNOSIS — E1165 Type 2 diabetes mellitus with hyperglycemia: Secondary | ICD-10-CM | POA: Diagnosis not present

## 2020-01-06 DIAGNOSIS — Z794 Long term (current) use of insulin: Secondary | ICD-10-CM | POA: Diagnosis not present

## 2020-01-07 ENCOUNTER — Ambulatory Visit (INDEPENDENT_AMBULATORY_CARE_PROVIDER_SITE_OTHER): Payer: BC Managed Care – PPO | Admitting: Family Medicine

## 2020-01-07 ENCOUNTER — Other Ambulatory Visit: Payer: Self-pay

## 2020-01-07 ENCOUNTER — Encounter: Payer: Self-pay | Admitting: Family Medicine

## 2020-01-07 VITALS — BP 118/70 | HR 78 | Temp 98.0°F | Ht 62.0 in | Wt 236.7 lb

## 2020-01-07 DIAGNOSIS — R109 Unspecified abdominal pain: Secondary | ICD-10-CM | POA: Diagnosis not present

## 2020-01-07 LAB — POCT URINALYSIS DIPSTICK
Bilirubin, UA: NEGATIVE
Blood, UA: NEGATIVE
Glucose, UA: NEGATIVE
Ketones, UA: NEGATIVE
Leukocytes, UA: NEGATIVE
Nitrite, UA: NEGATIVE
Protein, UA: NEGATIVE
Spec Grav, UA: 1.015 (ref 1.010–1.025)
Urobilinogen, UA: 0.2 E.U./dL
pH, UA: 6 (ref 5.0–8.0)

## 2020-01-07 NOTE — Progress Notes (Signed)
Subjective:     Patient ID: Miranda [name], female   DOB: 09-04-1976, 44 y.o.   MRN: [id number]  HPI   Miranda [name] is seen with acute left flank pain which came on suddenly around 6 or 7 PM last night.  She states since that time she has had some waves of pain.  She had some nausea without vomiting last night but none today.  No dysuria.  No gross hematuria.  No fevers or chills.  No known injury.  No exacerbating or alleviating factors.  No history of stones.  Denies any abdominal pain.  No dyspnea.  No cough.  Chronic problems include history of cerebrovascular disease, hypertension, fatty liver changes, diabetes, hypothyroidism, polycystic ovaries  Past Medical History:  Diagnosis Date  . Anxiety   . Arthritis   . Asthma   . Bowel obstruction ([place])   . Cardiac arrhythmia   . Depression    sees Dr. Matilde Haymaker in [place]   . Diabetes mellitus    sees Dr. Larna Daughters [place]   . GERD (gastroesophageal reflux disease)   . Hyperlipidemia   . Hypertension   . Hypothyroidism    sees Dr. Elyse Hsu  . Insomnia   . Migraine syndrome   . Morbid obesity ([place])   . 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 Neck pain   . Neuropathy associated with endocrine disorder ([place])   . Sleep apnea with use of continuous positive airway pressure (CPAP)    Sleep apnea is resolved due to weight loss. 08/2017  . 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 Stroke [facility]) 04/2016   Past Surgical History:  Procedure Laterality Date  . blocked itestinal repair  57   age 9 months  . BREAST BIOPSY Left 2017  . CHOLECYSTECTOMY  2011  . INDUCED ABORTION  1996   forced abortion  . INGUINAL HERNIA REPAIR Left 1980   age 53  . LAPAROSCOPIC ENDOMETRIOSIS FULGURATION  1998  . WISDOM TOOTH EXTRACTION  2007    reports that she has been smoking cigarettes. She started smoking about 22 months ago. She has a 0.25 pack-year smoking history. She has never used smokeless tobacco. She reports current drug use. Drug: Marijuana. She  reports that she does not drink alcohol. family history includes Diabetes in an other family member; Heart disease in an other family member. She was adopted. Allergies  Allergen Reactions  . Macrobid [Nitrofurantoin] Hives  . Metformin And Related Other (See Comments)    Abdominal cramping   . Byetta 10 Mcg Pen [Exenatide] Hives  . Clindamycin/Lincomycin Nausea And Vomiting  . Geodon [Ziprasidone Hcl]   . Lipitor [Atorvastatin]     Per patient this causes cramps  . Nsaids     Upset stomach   . Penicillins Other (See Comments)    Has patient had a PCN reaction causing immediate rash, facial/tongue/throat swelling, SOB or lightheadedness with hypotension: No Has patient had a PCN reaction causing severe rash involving mucus membranes or skin necrosis: No Has patient had a PCN reaction that required hospitalization No Has patient had a PCN reaction occurring within the last 10 years: No If all of the above answers are "NO", then may proceed with Cephalosporin use.   Danelle Berry [Dulaglutide]     Abdominal pain   . Avocado Diarrhea and Other (See Comments)    Severe cramping, sweats, and diarrhea in upper GI/stomach  . Tramadol Hcl Itching and Rash     Review of Systems  Constitutional:  Negative for chills and fever.  Respiratory: Negative for cough and shortness of breath.   Cardiovascular: Negative for chest pain.  Gastrointestinal: Negative for abdominal pain.  Genitourinary: Positive for flank pain. Negative for dysuria and hematuria.       Objective:   Physical Exam Vitals reviewed.  Constitutional:      Appearance: Normal appearance.  Cardiovascular:     Rate and Rhythm: Normal rate and regular rhythm.  Pulmonary:     Effort: Pulmonary effort is normal.     Breath sounds: Normal breath sounds.  Abdominal:     Palpations: Abdomen is soft. There is no mass.     Tenderness: There is no abdominal tenderness. There is no guarding or rebound.     Hernia: No hernia is  present.  Skin:    Findings: No rash.  Neurological:     Mental Status: She is alert.        Assessment:     Left flank pain.  Acute onset last night as above.  No prior history of stones.  Suspect probably musculoskeletal.  Urine dip shows no blood, so doubt stones.     Plan:     -Check urine dipstick= normal.  -she already has some Vicodin at home and will use as needed. -heat or ice for symptom relief -follow up for any fever, or persistent/worsening pain.  Eulas Post MD [facility]

## 2020-01-07 NOTE — Patient Instructions (Signed)
Your urine dip is normal- no blood or white blood cells  Take the Vicodin if needed.    Follow up for any fever or persistent or worsening pain.

## 2020-01-09 ENCOUNTER — Ambulatory Visit
Admission: RE | Admit: 2020-01-09 | Discharge: 2020-01-09 | Disposition: A | Discharge status: Home / Self Care | Payer: BC Managed Care – PPO | Source: Ambulatory Visit | Attending: Family Medicine | Admitting: Family Medicine

## 2020-01-09 ENCOUNTER — Encounter: Payer: Self-pay | Admitting: Family Medicine

## 2020-01-09 ENCOUNTER — Other Ambulatory Visit: Payer: Self-pay

## 2020-01-09 ENCOUNTER — Ambulatory Visit (INDEPENDENT_AMBULATORY_CARE_PROVIDER_SITE_OTHER): Payer: BC Managed Care – PPO | Admitting: Family Medicine

## 2020-01-09 VITALS — BP 134/86 | HR 76 | Temp 98.0°F | Ht 62.0 in | Wt 236.8 lb

## 2020-01-09 DIAGNOSIS — R1030 Lower abdominal pain, unspecified: Secondary | ICD-10-CM | POA: Diagnosis not present

## 2020-01-09 DIAGNOSIS — R109 Unspecified abdominal pain: Secondary | ICD-10-CM | POA: Diagnosis not present

## 2020-01-09 LAB — CBC WITH DIFFERENTIAL/PLATELET
Basophils Absolute: 0.1 10*3/uL (ref 0.0–0.1)
Basophils Relative: 1.1 % (ref 0.0–3.0)
Eosinophils Absolute: 0.2 10*3/uL (ref 0.0–0.7)
Eosinophils Relative: 3.1 % (ref 0.0–5.0)
HCT: 36.6 % (ref 36.0–46.0)
Hemoglobin: 13 g/dL (ref 12.0–15.0)
Lymphocytes Relative: 31.2 % (ref 12.0–46.0)
Lymphs Abs: 2.3 10*3/uL (ref 0.7–4.0)
MCHC: 35.5 g/dL (ref 30.0–36.0)
MCV: 90 fl (ref 78.0–100.0)
Monocytes Absolute: 0.6 10*3/uL (ref 0.1–1.0)
Monocytes Relative: 7.7 % (ref 3.0–12.0)
Neutro Abs: 4.3 10*3/uL (ref 1.4–7.7)
Neutrophils Relative %: 56.9 % (ref 43.0–77.0)
Platelets: 188 10*3/uL (ref 150.0–400.0)
RBC: 4.06 Mil/uL (ref 3.87–5.11)
RDW: 13.2 % (ref 11.5–15.5)
WBC: 7.5 10*3/uL (ref 4.0–10.5)

## 2020-01-09 LAB — BASIC METABOLIC PANEL
BUN: 13 mg/dL (ref 6–23)
CO2: 25 mEq/L (ref 19–32)
Calcium: 9 mg/dL (ref 8.4–10.5)
Chloride: 106 mEq/L (ref 96–112)
Creatinine, Ser: 0.75 mg/dL (ref 0.40–1.20)
GFR: 83.28 mL/min (ref >60.00)
Glucose, Bld: 271 mg/dL — ABNORMAL HIGH (ref 70–99)
Potassium: 3.9 mEq/L (ref 3.5–5.1)
Sodium: 136 mEq/L (ref 135–145)

## 2020-01-09 NOTE — Patient Instructions (Signed)
Flank Pain, Adult Flank pain is pain that is located on the side of the body between the upper abdomen and the back. This area is called the flank. The pain may occur over a short period of time (acute), or it may be long-term or recurring (chronic). It may be mild or severe. Flank pain can be caused by many things, including:  Muscle soreness or injury.  Kidney stones or kidney disease.  Stress.  A disease of the spine (vertebral disk disease).  A lung infection (pneumonia).  Fluid around the lungs (pulmonary edema).  A skin rash caused by the chickenpox virus (shingles).  Tumors that affect the back of the abdomen.  Gallbladder disease. Follow these instructions at home:   Drink enough fluid to keep your urine clear or pale yellow.  Rest as told by your health care provider.  Take over-the-counter and prescription medicines only as told by your health care provider.  Keep a journal to track what has caused your flank pain and what has made it feel better.  Keep all follow-up visits as told by your health care provider. This is important. Contact a health care provider if:  Your pain is not controlled with medicine.  You have new symptoms.  Your pain gets worse.  You have a fever.  Your symptoms last longer than 2-3 days.  You have trouble urinating or you are urinating very frequently. Get help right away if:  You have trouble breathing or you are short of breath.  Your abdomen hurts or it is swollen or red.  You have nausea or vomiting.  You feel faint or you pass out.  You have blood in your urine. Summary  Flank pain is pain that is located on the side of the body between the upper abdomen and the back.  The pain may occur over a short period of time (acute), or it may be long-term or recurring (chronic). It may be mild or severe.  Flank pain can be caused by many things.  Contact your health care provider if your symptoms get worse or they last  longer than 2-3 days. This information is not intended to replace advice given to you by your health care provider. Make sure you discuss any questions you have with your health care provider. Document Revised: 09/28/2017 Document Reviewed: 12/29/2016 Elsevier Patient Education  2020 [employer]vier Inc[employer]  

## 2020-01-09 NOTE — Progress Notes (Signed)
Subjective:     Patient ID: Miranda [name], female   DOB: 1976-08-16, 44 y.o.   MRN: [id number]  HPI Miranda [name] seen with continued left flank pain.  Refer to note from 2 days ago for details  Miranda [name] is seen with acute left flank pain which came on suddenly around 6 or 7 PM last night.  She states since that time she has had some waves of pain.  She had some nausea without vomiting last night but none today.  No dysuria.  No gross hematuria.  No fevers or chills.  No known injury.  No exacerbating or alleviating factors.  No history of stones.  Denies any abdominal pain.  No dyspnea.  No cough.  Chronic problems include history of cerebrovascular disease, hypertension, fatty liver changes, diabetes, hypothyroidism, polycystic ovaries  She states her pain is essentially unchanged.  She still has some nausea but no vomiting.  No stool changes.  No fever.  No chills.  Location is unchanged.  Her pain comes in waves.  Urine dipstick from 2 days ago did not show any leukocytes or blood.  No prior history of kidney stones.  She is taken occasional hydrocodone with mild relief.  Pain is intense at times and 9-10 out of 10 in severity  Past Medical History:  Diagnosis Date  . Anxiety   . Arthritis   . Asthma   . Bowel obstruction ([place])   . Cardiac arrhythmia   . Depression    sees Dr. Matilde Haymaker in [place]   . Diabetes mellitus    sees Dr. Larna Daughters [place]neni   . GERD (gastroesophageal reflux disease)   . Hyperlipidemia   . Hypertension   . Hypothyroidism    sees Dr. Elyse Hsu  . Insomnia   . Migraine syndrome   . Morbid obesity ([place])   . 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 Neck pain   . Neuropathy associated with endocrine disorder ([place])   . Sleep apnea with use of continuous positive airway pressure (CPAP)    Sleep apnea is resolved due to weight loss. 08/2017  . 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 Stroke [facility]) 04/2016   Past Surgical History:  Procedure Laterality Date  . blocked  itestinal repair  71   age 20 months  . BREAST BIOPSY Left 2017  . CHOLECYSTECTOMY  2011  . INDUCED ABORTION  1996   forced abortion  . INGUINAL HERNIA REPAIR Left 1980   age 29  . LAPAROSCOPIC ENDOMETRIOSIS FULGURATION  1998  . WISDOM TOOTH EXTRACTION  2007    reports that she has been smoking cigarettes. She started smoking about 22 months ago. She has a 0.25 pack-year smoking history. She has never used smokeless tobacco. She reports current drug use. Drug: Marijuana. She reports that she does not drink alcohol. family history includes Diabetes in an other family member; Heart disease in an other family member. She was adopted. Allergies  Allergen Reactions  . Macrobid [Nitrofurantoin] Hives  . Metformin And Related Other (See Comments)    Abdominal cramping   . Byetta 10 Mcg Pen [Exenatide] Hives  . Clindamycin/Lincomycin Nausea And Vomiting  . Geodon [Ziprasidone Hcl]   . Lipitor [Atorvastatin]     Per patient this causes cramps  . Nsaids     Upset stomach   . Penicillins Other (See Comments)    Has patient had a PCN reaction causing immediate rash, facial/tongue/throat swelling, SOB or lightheadedness with hypotension: No Has patient had a  PCN reaction causing severe rash involving mucus membranes or skin necrosis: No Has patient had a PCN reaction that required hospitalization No Has patient had a PCN reaction occurring within the last 10 years: No If all of the above answers are "NO", then may proceed with Cephalosporin use.   Danelle Berry [Dulaglutide]     Abdominal pain   . Avocado Diarrhea and Other (See Comments)    Severe cramping, sweats, and diarrhea in upper GI/stomach  . Tramadol Hcl Itching and Rash    Review of Systems  Constitutional: Negative for appetite change, chills and fever.  Respiratory: Negative for cough and shortness of breath.   Cardiovascular: Negative for chest pain.  Gastrointestinal: Positive for abdominal pain and nausea. Negative for  abdominal distention, diarrhea and vomiting.  Genitourinary: Positive for flank pain. Negative for dysuria and hematuria.       Objective:   Physical Exam Vitals reviewed.  Constitutional:      Appearance: Normal appearance.  Cardiovascular:     Rate and Rhythm: Normal rate and regular rhythm.  Pulmonary:     Effort: Pulmonary effort is normal.     Breath sounds: Normal breath sounds.  Abdominal:     Palpations: Abdomen is soft. There is no mass.     Tenderness: There is no abdominal tenderness. There is no guarding or rebound.  Musculoskeletal:     Comments: No spinal tenderness.  Neurological:     Mental Status: She is alert.        Assessment:     Left flank pain.  Acute onset a few days ago and intermittent and severe since onset.  Recent dipstick did not show any significant abnormalities.  Etiology unclear.  We explained that kidney stone cannot be definitively ruled out although less likely with no blood on urine dipstick    Plan:     -Check CBC with differential and basic metabolic panel -Set up CT abdomen pelvis to further assess -Follow-up immediately for any fever, worsening pain, or other concerns  Eulas Post MD [facility]

## 2020-01-12 ENCOUNTER — Ambulatory Visit (INDEPENDENT_AMBULATORY_CARE_PROVIDER_SITE_OTHER): Payer: BC Managed Care – PPO | Admitting: Family Medicine

## 2020-01-12 ENCOUNTER — Other Ambulatory Visit: Payer: Self-pay

## 2020-01-12 VITALS — BP 121/57 | Ht 62.0 in | Wt 236.7 lb

## 2020-01-12 DIAGNOSIS — M545 Low back pain, unspecified: Secondary | ICD-10-CM

## 2020-01-12 NOTE — Patient Instructions (Signed)
You have a lumbar strain. Ok to take tylenol for baseline pain relief (1-2 extra strength tabs 3x/day) Duexis as you have been for pain and inflammation. Baclofen as needed for muscle spasms (no driving on this medicine if it makes you sleepy). Heat or ice 15 minutes at a time 3-4 times a day. Topical biofreeze or aspercreme up to 4 times a day as needed. Stay as active as possible. Do home exercises and stretches as directed twice a day. Consider physical therapy if you're not improving but this should be the worst of the pain and it should improve from this point. Let me know how you're doing in 1-2 weeks if you're not getting better otherwise follow up with me as needed.

## 2020-01-12 NOTE — Progress Notes (Signed)
PCP: Laurey Morale, MD  Subjective:   HPI: Patient is a 44 y.o. female here for leftlumbar/flank pain.  Pain began last Tuesday (approximately 1 week prior) and is located over the left paraspinal muscles. There was no reported history of trauma or injury as she first noticed pain while sitting in a chair. Pain is described as sharp and repeatedly firing in rapid succession. She cannot take NSAIDS due to allergy however has attempted using hydrocodone, muscle relaxants, and heat for pain at night. She has had moderate relief however is unsure if it is the effect of medications or she is simply sleeping through the pain. She also reports a numbness type sensation over the area which she discovered as there was an "itchy feeling and while scratching the itch broke the skin." She does not report loss of sensation or tingling sensations radiating to the rest of the lower extremity.  She has been to PCP's office and had workup including CT abdomen and pelvis which was negative for cause of her pain.  Past Medical History:  Diagnosis Date  . Anxiety   . Arthritis   . Asthma   . Bowel obstruction ([place])   . Cardiac arrhythmia   . Depression    sees Dr. Matilde Haymaker in [place]   . Diabetes mellitus    sees Dr. Larna Daughters Averneni   . GERD (gastroesophageal reflux disease)   . Hyperlipidemia   . Hypertension   . Hypothyroidism    sees Dr. Elyse Hsu  . Insomnia   . Migraine syndrome   . Morbid obesity ([place])   . 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 Neck pain   . Neuropathy associated with endocrine disorder ([place])   . Sleep apnea with use of continuous positive airway pressure (CPAP)    Sleep apnea is resolved due to weight loss. 08/2017  . 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 Stroke [facility]) 04/2016    Current Outpatient Medications on File Prior to Visit  Medication Sig Dispense Refill  . albuterol (PROAIR HFA) 108 (90 Base) MCG/ACT inhaler Inhale 2 puffs into the lungs every 4 (four) hours as  needed for wheezing. 8.5 g 11  . ARIPiprazole (ABILIFY) 30 MG tablet Take 30 mg by mouth daily.    . baclofen (LIORESAL) 10 MG tablet     . budesonide-formoterol (SYMBICORT) 160-4.5 MCG/ACT inhaler Inhale 2 puffs into the lungs 2 (two) times daily. 1 Inhaler 11  . buPROPion (WELLBUTRIN XL) 150 MG 24 hr tablet     . clopidogrel (PLAVIX) 75 MG tablet TAKE ONE TABLET BY MOUTH DAILY 90 tablet 0  . diphenoxylate-atropine (LOMOTIL) 2.5-0.025 MG tablet Take 1 tablet by mouth every 6 (six) hours as needed for diarrhea or loose stools. 30 tablet 0  . gabapentin (NEURONTIN) 300 MG capsule Take 1 capsule (300 mg total) by mouth 3 (three) times daily. 90 capsule 2  . HYDROcodone-acetaminophen (NORCO) 5-325 MG tablet Take 1 tablet by mouth every 6 (six) hours as needed. 20 tablet 0  . hydrOXYzine (ATARAX/VISTARIL) 50 MG tablet Take 50 mg by mouth. Take 1 tablet every night and may take 3 other times as needed    . hydrOXYzine (VISTARIL) 50 MG capsule Take 50 mg by mouth 3 (three) times daily.    . hyoscyamine (LEVSIN SL) 0.125 MG SL tablet Place 1 tablet (0.125 mg total) under the tongue every 6 (six) hours as needed. 30 tablet 1  . Ibuprofen-Famotidine 800-26.6 MG TABS Take 1 tablet by  mouth 3 (three) times daily. 90 tablet 3  . insulin aspart (NOVOLOG) 100 UNIT/ML injection Use in the insulin pump as directed 10 mL 0  . levothyroxine (SYNTHROID, LEVOTHROID) 125 MCG tablet Take 1 tablet (125 mcg total) by mouth daily before breakfast. 90 tablet 3  . metoprolol succinate (TOPROL-XL) 25 MG 24 hr tablet Take 0.5 tablets (12.5 mg total) by mouth daily. 30 tablet 0  . Multiple Vitamin (MULTIVITAMIN) tablet Take 1 tablet by mouth daily.    . norethindrone (MICRONOR,CAMILA,ERRIN) 0.35 MG tablet     . NYAMYC powder APPLY TO AFFECTED AREA(S) FOUR TIMES A DAY 15 g 4  . pantoprazole (PROTONIX) 40 MG tablet Take 1 tablet (40 mg total) by mouth 2 (two) times daily. 60 tablet 11  . prazosin (MINIPRESS) 5 MG capsule Take  10 mg by mouth at bedtime.    . promethazine (PHENERGAN) 25 MG tablet Take 1 tablet (25 mg total) by mouth every 4 (four) hours as needed for nausea or vomiting. 60 tablet 1  . rosuvastatin (CRESTOR) 20 MG tablet Take 1 tablet (20 mg total) by mouth daily. 30 tablet 6  . tiZANidine (ZANAFLEX) 2 MG tablet TAKE ONE TABLET BY MOUTH EVERY 8 HOURS AS NEEDED FOR MUSCLE SPASMS 60 tablet 1  . topiramate (TOPAMAX) 100 MG tablet Take 100 mg by mouth 2 (two) times daily.     . traZODone (DESYREL) 150 MG tablet Take by mouth at bedtime.    . TRESIBA FLEXTOUCH 100 UNIT/ML SOPN FlexTouch Pen     . valACYclovir (VALTREX) 1000 MG tablet Take 1 tablet (1,000 mg total) by mouth 3 (three) times daily. (Patient taking differently: Take 1,000 mg by mouth 3 (three) times daily as needed. ) 21 tablet 5   No current facility-administered medications on file prior to visit.    Past Surgical History:  Procedure Laterality Date  . blocked itestinal repair  21   age 21 months  . BREAST BIOPSY Left 2017  . CHOLECYSTECTOMY  2011  . INDUCED ABORTION  1996   forced abortion  . INGUINAL HERNIA REPAIR Left 1980   age 76  . LAPAROSCOPIC ENDOMETRIOSIS FULGURATION  1998  . WISDOM TOOTH EXTRACTION  2007    Allergies  Allergen Reactions  . Macrobid [Nitrofurantoin] Hives  . Metformin And Related Other (See Comments)    Abdominal cramping   . Byetta 10 Mcg Pen [Exenatide] Hives  . Clindamycin/Lincomycin Nausea And Vomiting  . Geodon [Ziprasidone Hcl]   . Lipitor [Atorvastatin]     Per patient this causes cramps  . Nsaids     Upset stomach   . Penicillins Other (See Comments)    Has patient had a PCN reaction causing immediate rash, facial/tongue/throat swelling, SOB or lightheadedness with hypotension: No Has patient had a PCN reaction causing severe rash involving mucus membranes or skin necrosis: No Has patient had a PCN reaction that required hospitalization No Has patient had a PCN reaction occurring within  the last 10 years: No If all of the above answers are "NO", then may proceed with Cephalosporin use.   Danelle Berry [Dulaglutide]     Abdominal pain   . Avocado Diarrhea and Other (See Comments)    Severe cramping, sweats, and diarrhea in upper GI/stomach  . Tramadol Hcl Itching and Rash    Social History   Socioeconomic History  . Marital status: Divorced    Spouse name: Not on file  . Number of children: 5  . Years of education: Not  on file  . Highest education level: Not on file  Occupational History  . Occupation: Nurse, learning disability  Tobacco Use  . Smoking status: Current Every Day Smoker    Packs/day: 0.50    Years: 0.50    Pack years: 0.25    Types: Cigarettes    Start date: 02/24/2018  . Smokeless tobacco: Never Used  . Tobacco comment: has decreased how much - trying to quit  Substance and Sexual Activity  . Alcohol use: No    Alcohol/week: 0.0 standard drinks  . Drug use: Yes    Types: Marijuana    Comment: 2/10 last marijuana use  . Sexual activity: Not on file  Other Topics Concern  . Not on file  Social History Narrative  . Not on file   Social Determinants of Health   Financial Resource Strain:   . Difficulty of Paying Living Expenses:   Food Insecurity:   . Worried About Charity fundraiser in the Last Year:   . Arboriculturist in the Last Year:   Transportation Needs:   . Film/video editor (Medical):   Marland Kitchen Lack of Transportation (Non-Medical):   Physical Activity:   . Days of Exercise per Week:   . Minutes of Exercise per Session:   Stress:   . Feeling of Stress :   Social Connections:   . Frequency of Communication with Friends and Family:   . Frequency of Social Gatherings with Friends and Family:   . Attends Religious Services:   . Active Member of Clubs or Organizations:   . Attends Archivist Meetings:   Marland Kitchen Marital Status:   Intimate Partner Violence:   . Fear of Current or Ex-Partner:   . Emotionally Abused:    Marland Kitchen Physically Abused:   . Sexually Abused:     Family History  Adopted: Yes  Problem Relation Age of Onset  . Heart disease Other        on both sides of family  . Diabetes Other        on both sides of family    BP (!) 121/57   Ht 5\' 2"  (1.575 m)   Wt 236 lb 11.2 oz (107.4 kg)   BMI 43.29 kg/m   Review of Systems: See HPI above.     Objective:  Physical Exam:  General: Alert and oriented x3, NAD, comfortable in exam room HEENT: normocephalic and atraumatic, EOMI, conjunctivae and sclerae clear, MMM, no erythema or exudates, trachea midline,  Lumbar Spine Inspection: scratch covered by Band-Aid over left paraspinal from patient scratching, otherwise no rashes, no swelling,no scarring, no obvious bony abnormalities Palpation: patient very TTP over left lumbar paraspinal muscles, no pain on palpation of contralateral side, no point tenderness over vertebrae ROM (active): patient able to completely flex and extend lumbar spine Strength 5/5 bilateral lower extremities. Sensation intact to light touch. MSRs 1+ bilateral patellar and achilles tendons Special Tests: negative straight leg raise  Left Hip Inspection:  Palpation:no TTP over trochanteric bursa/SI joints/external rotator muscles ROM (passive): complete ROM to flexion and extension ROM (active): complete ROM to flexion and extension Strength: 5/5 in all directons, external rotators 5/5 Special Tests: negative FADIR and FABER   Assessment & Plan:   Miranda [name] is a 44 y.o. female presenting with ~1 week of recurrent sharp left sided lumbar pain. On exam patient is tender to palpation over left paraspinal muscle with reproducible pain and negative exam maneuvers for hip pathology. Less  suspicion for radicular etiology as patient denies radiating, equal/bilateral strength, and normal bilateral reflexes.   1. Lumbar Strain -- tylenol [phone] mg 3x/day -- baclofen as needed for spasms -- continue heat 15  minutes 3-4 times per day -- home exercises for lower back stretching -- f/u in 1-2 weeks if not improving   Carolyne Littles MS4

## 2020-01-13 ENCOUNTER — Encounter: Payer: Self-pay | Admitting: Family Medicine

## 2020-01-13 DIAGNOSIS — M545 Low back pain: Secondary | ICD-10-CM | POA: Diagnosis not present

## 2020-01-13 DIAGNOSIS — Z20822 Contact with and (suspected) exposure to covid-19: Secondary | ICD-10-CM | POA: Diagnosis not present

## 2020-01-13 DIAGNOSIS — Z01812 Encounter for preprocedural laboratory examination: Secondary | ICD-10-CM | POA: Diagnosis not present

## 2020-01-15 ENCOUNTER — Other Ambulatory Visit: Payer: Self-pay | Admitting: Family Medicine

## 2020-01-19 ENCOUNTER — Ambulatory Visit: Payer: BC Managed Care – PPO | Admitting: Psychology

## 2020-01-19 DIAGNOSIS — G4733 Obstructive sleep apnea (adult) (pediatric): Secondary | ICD-10-CM | POA: Diagnosis not present

## 2020-01-20 DIAGNOSIS — R635 Abnormal weight gain: Secondary | ICD-10-CM | POA: Diagnosis not present

## 2020-01-20 DIAGNOSIS — G4733 Obstructive sleep apnea (adult) (pediatric): Secondary | ICD-10-CM | POA: Diagnosis not present

## 2020-01-26 ENCOUNTER — Encounter: Payer: Self-pay | Admitting: Family Medicine

## 2020-01-27 ENCOUNTER — Encounter: Payer: Self-pay | Admitting: Family Medicine

## 2020-01-27 ENCOUNTER — Ambulatory Visit (INDEPENDENT_AMBULATORY_CARE_PROVIDER_SITE_OTHER): Payer: BC Managed Care – PPO | Admitting: Family Medicine

## 2020-01-27 ENCOUNTER — Other Ambulatory Visit: Payer: Self-pay

## 2020-01-27 VITALS — BP 130/80 | HR 75 | Temp 98.0°F | Wt 240.6 lb

## 2020-01-27 DIAGNOSIS — B029 Zoster without complications: Secondary | ICD-10-CM

## 2020-01-27 DIAGNOSIS — I63312 Cerebral infarction due to thrombosis of left middle cerebral artery: Secondary | ICD-10-CM | POA: Diagnosis not present

## 2020-01-27 DIAGNOSIS — G4733 Obstructive sleep apnea (adult) (pediatric): Secondary | ICD-10-CM | POA: Diagnosis not present

## 2020-01-27 DIAGNOSIS — I10 Essential (primary) hypertension: Secondary | ICD-10-CM | POA: Diagnosis not present

## 2020-01-27 DIAGNOSIS — I493 Ventricular premature depolarization: Secondary | ICD-10-CM | POA: Diagnosis not present

## 2020-01-27 DIAGNOSIS — N390 Urinary tract infection, site not specified: Secondary | ICD-10-CM | POA: Diagnosis not present

## 2020-01-27 MED ORDER — VALACYCLOVIR HCL 1 G PO TABS: 1000.0000 mg | ORAL_TABLET | Freq: Three times a day (TID) | ORAL | 0 refills | Status: DC

## 2020-01-27 MED ORDER — CIPROFLOXACIN HCL 500 MG PO TABS: 500.0000 mg | ORAL_TABLET | Freq: Two times a day (BID) | ORAL | 0 refills | Status: DC

## 2020-01-27 MED ORDER — METHYLPREDNISOLONE 4 MG PO TBPK: ORAL_TABLET | ORAL | 0 refills | Status: DC

## 2020-01-27 NOTE — Telephone Encounter (Signed)
She needs an OV for this  

## 2020-01-27 NOTE — Progress Notes (Signed)
   Subjective:    Patient ID: Miranda [name], female    DOB: 1976-01-10, 44 y.o.   MRN: [id number]  HPI Here for several issues. First she has had itching and a burning pain in the left lower back and left flank for the past 3 weeks. She saw Dr. Elease Hashimoto for this on 01-09-20 and he felt this may be from a kidney stone. She had a CT scan a few days later and this was clear. Then she saw Dr. Barbaraann Barthel on 01-12-20 and he felt she had a strained muscle. Because the area itched, she notcied herself scratching it a lot, and one of her roommates noticed a large red area on her back a few days ago. They have been dressing it with Neosporin and gauzed for several days. The other issue started 3 days ago with increased urgency to urinate, along with some burning. No fever. She drinks plenty of water. She is unable to produce a urine sample today.   Review of Systems  Constitutional: Negative.   Respiratory: Negative.   Cardiovascular: Negative.   Gastrointestinal: Negative.   Genitourinary: Positive for dysuria, frequency and urgency.  Musculoskeletal: Positive for back pain.  Skin: Positive for rash.       Objective:   Physical Exam Constitutional:      Appearance: Normal appearance.  Cardiovascular:     Rate and Rhythm: Normal rate and regular rhythm.     Pulses: Normal pulses.     Heart sounds: Normal heart sounds.  Pulmonary:     Effort: Pulmonary effort is normal.     Breath sounds: Normal breath sounds.  Abdominal:     General: Abdomen is flat. Bowel sounds are normal. There is no distension.     Palpations: Abdomen is soft. There is no mass.     Tenderness: There is no abdominal tenderness. There is no guarding or rebound.     Hernia: No hernia is present.  Musculoskeletal:        General: No tenderness.  Skin:    Comments: The left flank and left lower back has a wide area of erythematous macules and excoriated vesicles. There is also a 3 cm wide area of excoriation that is bordered  by some granulation tissue. There is a small eschar in the center   Neurological:     Mental Status: She is alert.           Assessment & Plan:  She has shingles, and this explains the itching and the pain she has been experiencing. She also has a superficial wound from scratching the area, which is healing in. There are no signs of cellulitis. She will stop dressing this area and leave it open to the air. She also likely has a UTI, and we will treat this with Cipro.  Alysia Penna, MD

## 2020-01-27 NOTE — Telephone Encounter (Signed)
Patient is scheduled for this afternoon.

## 2020-01-28 DIAGNOSIS — Z6841 Body Mass Index (BMI) 40.0 and over, adult: Secondary | ICD-10-CM | POA: Diagnosis not present

## 2020-01-28 DIAGNOSIS — E1165 Type 2 diabetes mellitus with hyperglycemia: Secondary | ICD-10-CM | POA: Diagnosis not present

## 2020-01-28 DIAGNOSIS — Z794 Long term (current) use of insulin: Secondary | ICD-10-CM | POA: Diagnosis not present

## 2020-02-02 ENCOUNTER — Ambulatory Visit (INDEPENDENT_AMBULATORY_CARE_PROVIDER_SITE_OTHER): Payer: BC Managed Care – PPO | Admitting: Psychology

## 2020-02-02 DIAGNOSIS — F4481 Dissociative identity disorder: Secondary | ICD-10-CM | POA: Diagnosis not present

## 2020-02-03 DIAGNOSIS — F449 Dissociative and conversion disorder, unspecified: Secondary | ICD-10-CM | POA: Diagnosis not present

## 2020-02-03 DIAGNOSIS — F4312 Post-traumatic stress disorder, chronic: Secondary | ICD-10-CM | POA: Diagnosis not present

## 2020-02-05 ENCOUNTER — Telehealth: Payer: Self-pay

## 2020-02-05 MED ORDER — GABAPENTIN 300 MG PO CAPS: 300.0000 mg | ORAL_CAPSULE | Freq: Three times a day (TID) | ORAL | 2 refills | Status: DC

## 2020-02-05 NOTE — Telephone Encounter (Signed)
Refill sent to pharmacy.   

## 2020-02-09 ENCOUNTER — Other Ambulatory Visit: Payer: Self-pay | Admitting: Family Medicine

## 2020-02-10 DIAGNOSIS — Z6841 Body Mass Index (BMI) 40.0 and over, adult: Secondary | ICD-10-CM | POA: Diagnosis not present

## 2020-02-10 DIAGNOSIS — Z713 Dietary counseling and surveillance: Secondary | ICD-10-CM | POA: Diagnosis not present

## 2020-02-13 ENCOUNTER — Telehealth: Payer: Self-pay | Admitting: Family Medicine

## 2020-02-13 NOTE — Telephone Encounter (Signed)
Pt called stating she is still having itching and soreness in the area where the shingles was located and would like some advice on what to do. She states its healing well. [phone]

## 2020-02-13 NOTE — Telephone Encounter (Signed)
Patient is aware 

## 2020-02-13 NOTE — Telephone Encounter (Signed)
Temporarily teel her to double up on the Gabapentin (take 2 capsules TID)

## 2020-02-16 ENCOUNTER — Ambulatory Visit (INDEPENDENT_AMBULATORY_CARE_PROVIDER_SITE_OTHER): Payer: BC Managed Care – PPO | Admitting: Psychology

## 2020-02-16 DIAGNOSIS — F4481 Dissociative identity disorder: Secondary | ICD-10-CM | POA: Diagnosis not present

## 2020-02-20 DIAGNOSIS — I639 Cerebral infarction, unspecified: Secondary | ICD-10-CM | POA: Diagnosis not present

## 2020-02-23 DIAGNOSIS — Z794 Long term (current) use of insulin: Secondary | ICD-10-CM | POA: Diagnosis not present

## 2020-02-23 DIAGNOSIS — Z6841 Body Mass Index (BMI) 40.0 and over, adult: Secondary | ICD-10-CM | POA: Diagnosis not present

## 2020-02-23 DIAGNOSIS — E1165 Type 2 diabetes mellitus with hyperglycemia: Secondary | ICD-10-CM | POA: Diagnosis not present

## 2020-03-01 ENCOUNTER — Ambulatory Visit (INDEPENDENT_AMBULATORY_CARE_PROVIDER_SITE_OTHER): Payer: BC Managed Care – PPO | Admitting: Psychology

## 2020-03-01 DIAGNOSIS — F4481 Dissociative identity disorder: Secondary | ICD-10-CM | POA: Diagnosis not present

## 2020-03-11 DIAGNOSIS — E1165 Type 2 diabetes mellitus with hyperglycemia: Secondary | ICD-10-CM | POA: Diagnosis not present

## 2020-03-11 DIAGNOSIS — Z794 Long term (current) use of insulin: Secondary | ICD-10-CM | POA: Diagnosis not present

## 2020-03-11 DIAGNOSIS — Z9641 Presence of insulin pump (external) (internal): Secondary | ICD-10-CM | POA: Diagnosis not present

## 2020-03-15 ENCOUNTER — Ambulatory Visit (INDEPENDENT_AMBULATORY_CARE_PROVIDER_SITE_OTHER): Payer: BC Managed Care – PPO | Admitting: Psychology

## 2020-03-15 DIAGNOSIS — F4481 Dissociative identity disorder: Secondary | ICD-10-CM

## 2020-03-23 DIAGNOSIS — Z9641 Presence of insulin pump (external) (internal): Secondary | ICD-10-CM | POA: Diagnosis not present

## 2020-03-23 DIAGNOSIS — E1165 Type 2 diabetes mellitus with hyperglycemia: Secondary | ICD-10-CM | POA: Diagnosis not present

## 2020-03-23 DIAGNOSIS — Z794 Long term (current) use of insulin: Secondary | ICD-10-CM | POA: Diagnosis not present

## 2020-03-29 DIAGNOSIS — N3000 Acute cystitis without hematuria: Secondary | ICD-10-CM | POA: Diagnosis not present

## 2020-03-29 DIAGNOSIS — R300 Dysuria: Secondary | ICD-10-CM | POA: Diagnosis not present

## 2020-03-31 DIAGNOSIS — Z794 Long term (current) use of insulin: Secondary | ICD-10-CM | POA: Diagnosis not present

## 2020-03-31 DIAGNOSIS — E1165 Type 2 diabetes mellitus with hyperglycemia: Secondary | ICD-10-CM | POA: Diagnosis not present

## 2020-04-01 ENCOUNTER — Ambulatory Visit: Payer: BC Managed Care – PPO | Admitting: Psychology

## 2020-04-02 ENCOUNTER — Ambulatory Visit (INDEPENDENT_AMBULATORY_CARE_PROVIDER_SITE_OTHER): Payer: BC Managed Care – PPO | Admitting: Psychology

## 2020-04-02 DIAGNOSIS — F4312 Post-traumatic stress disorder, chronic: Secondary | ICD-10-CM

## 2020-04-02 DIAGNOSIS — F4481 Dissociative identity disorder: Secondary | ICD-10-CM

## 2020-04-12 ENCOUNTER — Ambulatory Visit (INDEPENDENT_AMBULATORY_CARE_PROVIDER_SITE_OTHER): Payer: BC Managed Care – PPO | Admitting: Psychology

## 2020-04-12 DIAGNOSIS — F4481 Dissociative identity disorder: Secondary | ICD-10-CM | POA: Diagnosis not present

## 2020-04-12 DIAGNOSIS — F331 Major depressive disorder, recurrent, moderate: Secondary | ICD-10-CM | POA: Diagnosis not present

## 2020-04-14 DIAGNOSIS — E1165 Type 2 diabetes mellitus with hyperglycemia: Secondary | ICD-10-CM | POA: Diagnosis not present

## 2020-04-14 DIAGNOSIS — Z794 Long term (current) use of insulin: Secondary | ICD-10-CM | POA: Diagnosis not present

## 2020-04-26 ENCOUNTER — Ambulatory Visit (INDEPENDENT_AMBULATORY_CARE_PROVIDER_SITE_OTHER): Payer: BC Managed Care – PPO | Admitting: Psychology

## 2020-04-26 DIAGNOSIS — F4312 Post-traumatic stress disorder, chronic: Secondary | ICD-10-CM

## 2020-04-26 DIAGNOSIS — F4481 Dissociative identity disorder: Secondary | ICD-10-CM | POA: Diagnosis not present

## 2020-04-27 ENCOUNTER — Other Ambulatory Visit: Payer: Self-pay

## 2020-04-27 ENCOUNTER — Encounter: Payer: BC Managed Care – PPO | Admitting: Family Medicine

## 2020-04-27 DIAGNOSIS — E039 Hypothyroidism, unspecified: Secondary | ICD-10-CM | POA: Diagnosis not present

## 2020-04-27 DIAGNOSIS — J3489 Other specified disorders of nose and nasal sinuses: Secondary | ICD-10-CM | POA: Diagnosis not present

## 2020-04-27 DIAGNOSIS — I639 Cerebral infarction, unspecified: Secondary | ICD-10-CM | POA: Diagnosis not present

## 2020-04-27 DIAGNOSIS — R2681 Unsteadiness on feet: Secondary | ICD-10-CM | POA: Diagnosis not present

## 2020-04-27 DIAGNOSIS — I10 Essential (primary) hypertension: Secondary | ICD-10-CM | POA: Diagnosis not present

## 2020-04-27 DIAGNOSIS — R471 Dysarthria and anarthria: Secondary | ICD-10-CM | POA: Diagnosis not present

## 2020-04-27 DIAGNOSIS — E119 Type 2 diabetes mellitus without complications: Secondary | ICD-10-CM | POA: Diagnosis not present

## 2020-04-27 DIAGNOSIS — D72829 Elevated white blood cell count, unspecified: Secondary | ICD-10-CM | POA: Diagnosis not present

## 2020-04-27 DIAGNOSIS — J449 Chronic obstructive pulmonary disease, unspecified: Secondary | ICD-10-CM | POA: Diagnosis not present

## 2020-04-27 DIAGNOSIS — R200 Anesthesia of skin: Secondary | ICD-10-CM | POA: Diagnosis not present

## 2020-04-27 DIAGNOSIS — E785 Hyperlipidemia, unspecified: Secondary | ICD-10-CM | POA: Diagnosis not present

## 2020-04-27 DIAGNOSIS — G43909 Migraine, unspecified, not intractable, without status migrainosus: Secondary | ICD-10-CM | POA: Diagnosis not present

## 2020-04-27 DIAGNOSIS — G4733 Obstructive sleep apnea (adult) (pediatric): Secondary | ICD-10-CM | POA: Diagnosis not present

## 2020-04-27 DIAGNOSIS — R531 Weakness: Secondary | ICD-10-CM | POA: Diagnosis not present

## 2020-04-27 DIAGNOSIS — R4701 Aphasia: Secondary | ICD-10-CM | POA: Diagnosis not present

## 2020-04-27 DIAGNOSIS — D509 Iron deficiency anemia, unspecified: Secondary | ICD-10-CM | POA: Diagnosis not present

## 2020-04-27 DIAGNOSIS — I6389 Other cerebral infarction: Secondary | ICD-10-CM | POA: Diagnosis not present

## 2020-04-27 DIAGNOSIS — E1165 Type 2 diabetes mellitus with hyperglycemia: Secondary | ICD-10-CM | POA: Diagnosis not present

## 2020-04-27 DIAGNOSIS — R2990 Unspecified symptoms and signs involving the nervous system: Secondary | ICD-10-CM | POA: Diagnosis not present

## 2020-04-27 DIAGNOSIS — R410 Disorientation, unspecified: Secondary | ICD-10-CM | POA: Diagnosis not present

## 2020-04-27 DIAGNOSIS — R251 Tremor, unspecified: Secondary | ICD-10-CM | POA: Diagnosis not present

## 2020-04-27 DIAGNOSIS — R29898 Other symptoms and signs involving the musculoskeletal system: Secondary | ICD-10-CM | POA: Diagnosis not present

## 2020-04-27 DIAGNOSIS — F333 Major depressive disorder, recurrent, severe with psychotic symptoms: Secondary | ICD-10-CM | POA: Diagnosis not present

## 2020-04-27 DIAGNOSIS — I69351 Hemiplegia and hemiparesis following cerebral infarction affecting right dominant side: Secondary | ICD-10-CM | POA: Diagnosis not present

## 2020-04-28 DIAGNOSIS — R531 Weakness: Secondary | ICD-10-CM | POA: Diagnosis not present

## 2020-04-28 DIAGNOSIS — R4182 Altered mental status, unspecified: Secondary | ICD-10-CM | POA: Diagnosis not present

## 2020-04-28 DIAGNOSIS — R2990 Unspecified symptoms and signs involving the nervous system: Secondary | ICD-10-CM | POA: Diagnosis not present

## 2020-04-28 DIAGNOSIS — G43909 Migraine, unspecified, not intractable, without status migrainosus: Secondary | ICD-10-CM | POA: Diagnosis not present

## 2020-04-28 DIAGNOSIS — E119 Type 2 diabetes mellitus without complications: Secondary | ICD-10-CM | POA: Diagnosis not present

## 2020-04-28 DIAGNOSIS — I6389 Other cerebral infarction: Secondary | ICD-10-CM | POA: Diagnosis not present

## 2020-04-28 DIAGNOSIS — J3489 Other specified disorders of nose and nasal sinuses: Secondary | ICD-10-CM | POA: Diagnosis not present

## 2020-04-28 DIAGNOSIS — R200 Anesthesia of skin: Secondary | ICD-10-CM | POA: Diagnosis not present

## 2020-04-28 DIAGNOSIS — F329 Major depressive disorder, single episode, unspecified: Secondary | ICD-10-CM | POA: Diagnosis not present

## 2020-04-28 DIAGNOSIS — I10 Essential (primary) hypertension: Secondary | ICD-10-CM | POA: Diagnosis not present

## 2020-04-28 DIAGNOSIS — I6782 Cerebral ischemia: Secondary | ICD-10-CM | POA: Diagnosis not present

## 2020-04-28 DIAGNOSIS — J341 Cyst and mucocele of nose and nasal sinus: Secondary | ICD-10-CM | POA: Diagnosis not present

## 2020-04-28 NOTE — Progress Notes (Signed)
This encounter was created in error - please disregard.

## 2020-04-29 ENCOUNTER — Encounter: Payer: Self-pay | Admitting: Family Medicine

## 2020-04-29 DIAGNOSIS — G4733 Obstructive sleep apnea (adult) (pediatric): Secondary | ICD-10-CM | POA: Diagnosis not present

## 2020-04-29 DIAGNOSIS — R531 Weakness: Secondary | ICD-10-CM | POA: Diagnosis not present

## 2020-04-29 DIAGNOSIS — I69351 Hemiplegia and hemiparesis following cerebral infarction affecting right dominant side: Secondary | ICD-10-CM | POA: Diagnosis not present

## 2020-04-29 DIAGNOSIS — D509 Iron deficiency anemia, unspecified: Secondary | ICD-10-CM | POA: Diagnosis not present

## 2020-04-29 DIAGNOSIS — E785 Hyperlipidemia, unspecified: Secondary | ICD-10-CM | POA: Diagnosis not present

## 2020-04-29 DIAGNOSIS — R410 Disorientation, unspecified: Secondary | ICD-10-CM | POA: Diagnosis not present

## 2020-04-29 DIAGNOSIS — F333 Major depressive disorder, recurrent, severe with psychotic symptoms: Secondary | ICD-10-CM | POA: Diagnosis not present

## 2020-04-29 DIAGNOSIS — D72829 Elevated white blood cell count, unspecified: Secondary | ICD-10-CM | POA: Diagnosis not present

## 2020-04-29 DIAGNOSIS — I10 Essential (primary) hypertension: Secondary | ICD-10-CM | POA: Diagnosis not present

## 2020-04-29 DIAGNOSIS — R2681 Unsteadiness on feet: Secondary | ICD-10-CM | POA: Diagnosis not present

## 2020-04-29 DIAGNOSIS — E039 Hypothyroidism, unspecified: Secondary | ICD-10-CM | POA: Diagnosis not present

## 2020-04-29 DIAGNOSIS — J449 Chronic obstructive pulmonary disease, unspecified: Secondary | ICD-10-CM | POA: Diagnosis not present

## 2020-04-29 DIAGNOSIS — R200 Anesthesia of skin: Secondary | ICD-10-CM | POA: Diagnosis not present

## 2020-04-29 DIAGNOSIS — R29898 Other symptoms and signs involving the musculoskeletal system: Secondary | ICD-10-CM | POA: Diagnosis not present

## 2020-04-29 DIAGNOSIS — G43909 Migraine, unspecified, not intractable, without status migrainosus: Secondary | ICD-10-CM | POA: Diagnosis not present

## 2020-04-29 DIAGNOSIS — R251 Tremor, unspecified: Secondary | ICD-10-CM | POA: Diagnosis not present

## 2020-05-04 ENCOUNTER — Encounter: Payer: Self-pay | Admitting: Family Medicine

## 2020-05-04 ENCOUNTER — Other Ambulatory Visit: Payer: Self-pay

## 2020-05-04 ENCOUNTER — Ambulatory Visit: Payer: BC Managed Care – PPO | Admitting: Family Medicine

## 2020-05-04 VITALS — BP 138/72 | HR 76 | Temp 98.3°F | Ht 62.0 in | Wt 236.0 lb

## 2020-05-04 DIAGNOSIS — Z794 Long term (current) use of insulin: Secondary | ICD-10-CM | POA: Diagnosis not present

## 2020-05-04 DIAGNOSIS — E1165 Type 2 diabetes mellitus with hyperglycemia: Secondary | ICD-10-CM | POA: Diagnosis not present

## 2020-05-04 DIAGNOSIS — Z6841 Body Mass Index (BMI) 40.0 and over, adult: Secondary | ICD-10-CM | POA: Diagnosis not present

## 2020-05-04 DIAGNOSIS — G43711 Chronic migraine without aura, intractable, with status migrainosus: Secondary | ICD-10-CM

## 2020-05-04 MED ORDER — KETOROLAC TROMETHAMINE 60 MG/2ML IM SOLN
60.0000 mg | Freq: Once | INTRAMUSCULAR | Status: AC
  Administered 2020-05-04: 60 mg via INTRAMUSCULAR

## 2020-05-04 NOTE — Patient Instructions (Signed)
Health Maintenance Due  Topic Date Due  . COVID-19 Vaccine (1) Never done  . HIV Screening  Never done  . PAP SMEAR-Modifier  Never done  . FOOT EXAM  08/04/2016  . OPHTHALMOLOGY EXAM  03/08/2019  . HEMOGLOBIN A1C  12/17/2019    No flowsheet data found.

## 2020-05-04 NOTE — Progress Notes (Signed)
   Subjective:    Patient ID: Miranda [name], female    DOB: [birth year], 44 y.o.   MRN: [id number]  HPI Here to follow up from a hospital stay at [facility] from 04-27-20 to 04-29-20 for confusion, difficulty speaking, and left sided weakness. The CT and MRI scans ruled out any acute strokes, so the neurologist determined she was having complex migraines. She was sent home on her usual ASA and Plavix, and she takes Baclofen as needed. She is scheduled to see her regular neurologist, Dr. Teodora Medici, on 05-17-20. The weakness and the speech issues have improved, and she has begun outpatient PT and OT and ST.  However she has had a constant headache for the past 4 days. She had a friend drive her here today.    Review of Systems  Constitutional: Negative.   Respiratory: Negative.   Cardiovascular: Negative.   Neurological: Positive for headaches.       Objective:   Physical Exam Constitutional:      Comments: In pain, photophobic   Cardiovascular:     Rate and Rhythm: Normal rate and regular rhythm.     Pulses: Normal pulses.     Heart sounds: Normal heart sounds.  Pulmonary:     Effort: Pulmonary effort is normal.     Breath sounds: Normal breath sounds.  Neurological:     General: No focal deficit present.     Mental Status: She is alert and oriented to person, place, and time.           Assessment & Plan:  Status migrainosis. She is given a Toradol shot today. She will follow up with Dr. Teryl Lucy as above. Alysia Penna, MD

## 2020-05-04 NOTE — Addendum Note (Signed)
Addended by: Gwenyth Ober R on: 05/04/2020 04:19 PM   Modules accepted: Orders

## 2020-05-05 ENCOUNTER — Ambulatory Visit (INDEPENDENT_AMBULATORY_CARE_PROVIDER_SITE_OTHER): Payer: BC Managed Care – PPO | Admitting: Psychology

## 2020-05-05 DIAGNOSIS — I69911 Memory deficit following unspecified cerebrovascular disease: Secondary | ICD-10-CM | POA: Diagnosis not present

## 2020-05-05 DIAGNOSIS — F4481 Dissociative identity disorder: Secondary | ICD-10-CM | POA: Diagnosis not present

## 2020-05-05 DIAGNOSIS — I69918 Other symptoms and signs involving cognitive functions following unspecified cerebrovascular disease: Secondary | ICD-10-CM | POA: Diagnosis not present

## 2020-05-05 DIAGNOSIS — I69923 Fluency disorder following unspecified cerebrovascular disease: Secondary | ICD-10-CM | POA: Diagnosis not present

## 2020-05-05 DIAGNOSIS — R29898 Other symptoms and signs involving the musculoskeletal system: Secondary | ICD-10-CM | POA: Diagnosis not present

## 2020-05-05 DIAGNOSIS — R2689 Other abnormalities of gait and mobility: Secondary | ICD-10-CM | POA: Diagnosis not present

## 2020-05-05 DIAGNOSIS — R2990 Unspecified symptoms and signs involving the nervous system: Secondary | ICD-10-CM | POA: Diagnosis not present

## 2020-05-05 DIAGNOSIS — R519 Headache, unspecified: Secondary | ICD-10-CM | POA: Diagnosis not present

## 2020-05-05 DIAGNOSIS — Z7409 Other reduced mobility: Secondary | ICD-10-CM | POA: Diagnosis not present

## 2020-05-05 DIAGNOSIS — R41841 Cognitive communication deficit: Secondary | ICD-10-CM | POA: Diagnosis not present

## 2020-05-06 DIAGNOSIS — R531 Weakness: Secondary | ICD-10-CM | POA: Diagnosis not present

## 2020-05-06 DIAGNOSIS — R29818 Other symptoms and signs involving the nervous system: Secondary | ICD-10-CM | POA: Diagnosis not present

## 2020-05-06 DIAGNOSIS — F17210 Nicotine dependence, cigarettes, uncomplicated: Secondary | ICD-10-CM | POA: Diagnosis not present

## 2020-05-06 DIAGNOSIS — R278 Other lack of coordination: Secondary | ICD-10-CM | POA: Diagnosis not present

## 2020-05-10 ENCOUNTER — Other Ambulatory Visit: Payer: Self-pay

## 2020-05-10 ENCOUNTER — Ambulatory Visit (INDEPENDENT_AMBULATORY_CARE_PROVIDER_SITE_OTHER): Payer: BC Managed Care – PPO | Admitting: Family Medicine

## 2020-05-10 ENCOUNTER — Encounter: Payer: Self-pay | Admitting: Family Medicine

## 2020-05-10 ENCOUNTER — Ambulatory Visit (INDEPENDENT_AMBULATORY_CARE_PROVIDER_SITE_OTHER): Payer: BC Managed Care – PPO | Admitting: Psychology

## 2020-05-10 VITALS — BP 111/63 | Ht 62.0 in | Wt 235.0 lb

## 2020-05-10 DIAGNOSIS — I69911 Memory deficit following unspecified cerebrovascular disease: Secondary | ICD-10-CM | POA: Diagnosis not present

## 2020-05-10 DIAGNOSIS — R519 Headache, unspecified: Secondary | ICD-10-CM | POA: Diagnosis not present

## 2020-05-10 DIAGNOSIS — R29898 Other symptoms and signs involving the musculoskeletal system: Secondary | ICD-10-CM | POA: Diagnosis not present

## 2020-05-10 DIAGNOSIS — R531 Weakness: Secondary | ICD-10-CM | POA: Diagnosis not present

## 2020-05-10 DIAGNOSIS — I63512 Cerebral infarction due to unspecified occlusion or stenosis of left middle cerebral artery: Secondary | ICD-10-CM

## 2020-05-10 DIAGNOSIS — R2689 Other abnormalities of gait and mobility: Secondary | ICD-10-CM | POA: Diagnosis not present

## 2020-05-10 DIAGNOSIS — R2990 Unspecified symptoms and signs involving the nervous system: Secondary | ICD-10-CM | POA: Diagnosis not present

## 2020-05-10 DIAGNOSIS — M170 Bilateral primary osteoarthritis of knee: Secondary | ICD-10-CM

## 2020-05-10 DIAGNOSIS — F4481 Dissociative identity disorder: Secondary | ICD-10-CM | POA: Diagnosis not present

## 2020-05-10 DIAGNOSIS — I69923 Fluency disorder following unspecified cerebrovascular disease: Secondary | ICD-10-CM | POA: Diagnosis not present

## 2020-05-10 DIAGNOSIS — G43711 Chronic migraine without aura, intractable, with status migrainosus: Secondary | ICD-10-CM

## 2020-05-10 DIAGNOSIS — R41841 Cognitive communication deficit: Secondary | ICD-10-CM | POA: Diagnosis not present

## 2020-05-10 DIAGNOSIS — I69918 Other symptoms and signs involving cognitive functions following unspecified cerebrovascular disease: Secondary | ICD-10-CM | POA: Diagnosis not present

## 2020-05-10 DIAGNOSIS — R278 Other lack of coordination: Secondary | ICD-10-CM | POA: Diagnosis not present

## 2020-05-10 DIAGNOSIS — Z7409 Other reduced mobility: Secondary | ICD-10-CM | POA: Diagnosis not present

## 2020-05-10 MED ORDER — GABAPENTIN 300 MG PO CAPS: 300.0000 mg | ORAL_CAPSULE | Freq: Three times a day (TID) | ORAL | 2 refills | Status: DC

## 2020-05-10 NOTE — Progress Notes (Addendum)
PCP: Laurey Morale, MD  Subjective:   HPI: Patient is a 44 y.o. female here for bilateral knee injections.  Patient returns today to start gelsyn injections for severe medial arthritis bilateral knees. Pain medial bilaterally. No new injuries. Was released from hospital with diagnosis of complicated migraine after CT, MRI were reassuring.  Past Medical History:  Diagnosis Date  . Anxiety   . Arthritis   . Asthma   . Bowel obstruction ([place])   . Cardiac arrhythmia   . Depression    sees Dr. Matilde Haymaker in [place]   . Diabetes mellitus    sees Dr. Larna Daughters Averneni   . GERD (gastroesophageal reflux disease)   . Hyperlipidemia   . Hypertension   . Hypothyroidism    sees Dr. Elyse Hsu  . Insomnia   . Migraine syndrome    sees Dr. Teodora Medici at [facility]   . Morbid obesity [facility])   . 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 Neck pain   . Neuropathy associated with endocrine disorder ([place])   . Sleep apnea with use of continuous positive airway pressure (CPAP)    Sleep apnea is resolved due to weight loss. 08/2017  . 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 Stroke [facility]) 04/2016   sees Dr. Teod[facility] Medici at [facility]     Current Outpatient Medications on File Prior to Visit  Medication Sig Dispense Refill  . albuterol (PROAIR HFA) 108 (90 Base) MCG/ACT inhaler Inhale 2 puffs into the lungs every 4 (four) hours as needed for wheezing. 8.5 g 11  . ARIPiprazole (ABILIFY) 30 MG tablet Take 30 mg by mouth daily.    . baclofen (LIORESAL) 10 MG tablet     . budesonide-formoterol (SYMBICORT) 160-4.5 MCG/ACT inhaler Inhale 2 puffs into the lungs 2 (two) times daily. 1 Inhaler 11  . buPROPion (WELLBUTRIN XL) 150 MG 24 hr tablet     . ciprofloxacin (CIPRO) 500 MG tablet Take 1 tablet (500 mg total) by mouth 2 (two) times daily. 14 tablet 0  . clopidogrel (PLAVIX) 75 MG tablet TAKE ONE TABLET BY MOUTH DAILY 90 tablet 0  . diphenoxylate-atropine (LOMOTIL) 2.5-0.025 MG tablet Take 1 tablet by mouth  every 6 (six) hours as needed for diarrhea or loose stools. 30 tablet 0  . HYDROcodone-acetaminophen (NORCO) 5-325 MG tablet Take 1 tablet by mouth every 6 (six) hours as needed. 20 tablet 0  . hydrOXYzine (ATARAX/VISTARIL) 50 MG tablet Take 50 mg by mouth. Take 1 tablet every night and may take 3 other times as needed    . hydrOXYzine (VISTARIL) 50 MG capsule Take 50 mg by mouth 3 (three) times daily.    . hyoscyamine (LEVSIN SL) 0.125 MG SL tablet Place 1 tablet (0.125 mg total) under the tongue every 6 (six) hours as needed. 30 tablet 1  . Ibuprofen-Famotidine 800-26.6 MG TABS Take 1 tablet by mouth 3 (three) times daily. 90 tablet 3  . insulin aspart (NOVOLOG) 100 UNIT/ML injection Use in the insulin pump as directed 10 mL 0  . levothyroxine (SYNTHROID, LEVOTHROID) 125 MCG tablet Take 1 tablet (125 mcg total) by mouth daily before breakfast. 90 tablet 3  . methylPREDNISolone (MEDROL DOSEPAK) 4 MG TBPK tablet As directed 21 tablet 0  . metoprolol succinate (TOPROL-XL) 25 MG 24 hr tablet Take 0.5 tablets (12.5 mg total) by mouth daily. 30 tablet 0  . Multiple Vitamin (MULTIVITAMIN) tablet Take 1 tablet by mouth daily.    . norethindrone (MICRONOR,CAMILA,ERRIN) 0.35  MG tablet     . NYAMYC powder APPLY TO AFFECTED AREAS FOUR TIMES A DAY 15 g 3  . pantoprazole (PROTONIX) 40 MG tablet Take 1 tablet (40 mg total) by mouth 2 (two) times daily. 60 tablet 11  . prazosin (MINIPRESS) 5 MG capsule Take 10 mg by mouth at bedtime.    . promethazine (PHENERGAN) 25 MG tablet Take 1 tablet (25 mg total) by mouth every 4 (four) hours as needed for nausea or vomiting. 60 tablet 1  . rosuvastatin (CRESTOR) 20 MG tablet Take 1 tablet (20 mg total) by mouth daily. 30 tablet 6  . tiZANidine (ZANAFLEX) 2 MG tablet TAKE ONE TABLET BY MOUTH EVERY 8 HOURS AS NEEDED FOR MUSCLE SPASMS 60 tablet 1  . topiramate (TOPAMAX) 100 MG tablet Take 100 mg by mouth 2 (two) times daily.     . traZODone (DESYREL) 150 MG tablet Take by  mouth at bedtime.    . TRESIBA FLEXTOUCH 100 UNIT/ML SOPN FlexTouch Pen     . valACYclovir (VALTREX) 1000 MG tablet Take 1 tablet (1,000 mg total) by mouth 3 (three) times daily. 30 tablet 0   No current facility-administered medications on file prior to visit.    Past Surgical History:  Procedure Laterality Date  . blocked itestinal repair  29   age 66 months  . BREAST BIOPSY Left 2017  . CHOLECYSTECTOMY  2011  . INDUCED ABORTION  1996   forced abortion  . INGUINAL HERNIA REPAIR Left 1980   age 44  . LAPAROSCOPIC ENDOMETRIOSIS FULGURATION  1998  . WISDOM TOOTH EXTRACTION  2007    Allergies  Allergen Reactions  . Macrobid [Nitrofurantoin] Hives  . Metformin And Related Other (See Comments)    Abdominal cramping   . Byetta 10 Mcg Pen [Exenatide] Hives  . Clindamycin/Lincomycin Nausea And Vomiting  . Geodon [Ziprasidone Hcl]   . Lipitor [Atorvastatin]     Per patient this causes cramps  . Nsaids     Upset stomach   . Penicillins Other (See Comments)    Has patient had a PCN reaction causing immediate rash, facial/tongue/throat swelling, SOB or lightheadedness with hypotension: No Has patient had a PCN reaction causing severe rash involving mucus membranes or skin necrosis: No Has patient had a PCN reaction that required hospitalization No Has patient had a PCN reaction occurring within the last 10 years: No If all of the above answers are "NO", then may proceed with Cephalosporin use.   Danelle Berry [Dulaglutide]     Abdominal pain   . Avocado Diarrhea and Other (See Comments)    Severe cramping, sweats, and diarrhea in upper GI/stomach  . Tramadol Hcl Itching and Rash    Social History   Socioeconomic History  . Marital status: Significant Other    Spouse name: Not on file  . Number of children: 5  . Years of education: Not on file  . Highest education level: Not on file  Occupational History  . Occupation: Nurse, learning disability  Tobacco Use  .  Smoking status: Current Every Day Smoker    Packs/day: 0.50    Years: 0.50    Pack years: 0.25    Types: Cigarettes    Start date: 02/24/2018  . Smokeless tobacco: Never Used  . Tobacco comment: has decreased how much - trying to quit  Vaping Use  . Vaping Use: Never used  Substance and Sexual Activity  . Alcohol use: No    Alcohol/week: 0.0 standard drinks  .  Drug use: Yes    Types: Marijuana    Comment: 2/10 last marijuana use  . Sexual activity: Not on file  Other Topics Concern  . Not on file  Social History Narrative  . Not on file   Social Determinants of Health   Financial Resource Strain:   . Difficulty of Paying Living Expenses:   Food Insecurity:   . Worried About Charity fundraiser in the Last Year:   . Arboriculturist in the Last Year:   Transportation Needs:   . Film/video editor (Medical):   Marland Kitchen Lack of Transportation (Non-Medical):   Physical Activity:   . Days of Exercise per Week:   . Minutes of Exercise per Session:   Stress:   . Feeling of Stress :   Social Connections:   . Frequency of Communication with Friends and Family:   . Frequency of Social Gatherings with Friends and Family:   . Attends Religious Services:   . Active Member of Clubs or Organizations:   . Attends Archivist Meetings:   Marland Kitchen Marital Status:   Intimate Partner Violence:   . Fear of Current or Ex-Partner:   . Emotionally Abused:   Marland Kitchen Physically Abused:   . Sexually Abused:     Family History  Adopted: Yes  Problem Relation Age of Onset  . Heart disease Other        on both sides of family  . Diabetes Other        on both sides of family    BP 111/63   Ht 5\' 2"  (1.575 m)   Wt 235 lb (106.6 kg)   BMI 42.98 kg/m   Review of Systems: See HPI above.     Objective:  Physical Exam:  Gen: NAD, comfortable in exam room  Rest of exam not repeated today.   Assessment & Plan:  1. Bilateral knee arthritis - lack of improvement to date with conservative  measures.  Has been trying to save up time so she can have arthroplasty.  Gelsyn injections given today bilaterally.  Previously discussed tylenol, topical medications, supplements, home exercises.  F/u in 1 week for second injections.  After informed written consent timeout was performed, patient was seated on exam table. Right knee was prepped with alcohol swab and utilizing anteromedial approach, patient's right knee was injected intraarticularly with 37mL bupivicaine followed by gelsyn. Patient tolerated the procedure well without immediate complications.  After informed written consent timeout was performed, patient was seated on exam table. Left knee was prepped with alcohol swab and utilizing anteromedial approach, patient's left knee was injected intraarticularly with 63mL bupivicaine followed by gelsyn. Patient tolerated the procedure well without immediate complications.

## 2020-05-12 NOTE — Telephone Encounter (Signed)
No, I do not know any specific neurologists at [facility] or [facility]. The Dr. Jinny Sanders you mentioned is actually a neurosurgeon, so he would not be appropriate for her to see. She needs to see a Neurologist. I can just do a general referral to [place] if she wishes

## 2020-05-13 DIAGNOSIS — R2689 Other abnormalities of gait and mobility: Secondary | ICD-10-CM | POA: Diagnosis not present

## 2020-05-13 DIAGNOSIS — I69918 Other symptoms and signs involving cognitive functions following unspecified cerebrovascular disease: Secondary | ICD-10-CM | POA: Diagnosis not present

## 2020-05-13 DIAGNOSIS — Z7409 Other reduced mobility: Secondary | ICD-10-CM | POA: Diagnosis not present

## 2020-05-13 DIAGNOSIS — R2990 Unspecified symptoms and signs involving the nervous system: Secondary | ICD-10-CM | POA: Diagnosis not present

## 2020-05-13 DIAGNOSIS — I69911 Memory deficit following unspecified cerebrovascular disease: Secondary | ICD-10-CM | POA: Diagnosis not present

## 2020-05-13 DIAGNOSIS — R519 Headache, unspecified: Secondary | ICD-10-CM | POA: Diagnosis not present

## 2020-05-13 DIAGNOSIS — R41841 Cognitive communication deficit: Secondary | ICD-10-CM | POA: Diagnosis not present

## 2020-05-13 DIAGNOSIS — I69923 Fluency disorder following unspecified cerebrovascular disease: Secondary | ICD-10-CM | POA: Diagnosis not present

## 2020-05-13 DIAGNOSIS — R29898 Other symptoms and signs involving the musculoskeletal system: Secondary | ICD-10-CM | POA: Diagnosis not present

## 2020-05-13 DIAGNOSIS — R278 Other lack of coordination: Secondary | ICD-10-CM | POA: Diagnosis not present

## 2020-05-13 DIAGNOSIS — R531 Weakness: Secondary | ICD-10-CM | POA: Diagnosis not present

## 2020-05-14 NOTE — Telephone Encounter (Signed)
I did the referral to [facility]

## 2020-05-17 DIAGNOSIS — R29898 Other symptoms and signs involving the musculoskeletal system: Secondary | ICD-10-CM | POA: Diagnosis not present

## 2020-05-17 DIAGNOSIS — R2990 Unspecified symptoms and signs involving the nervous system: Secondary | ICD-10-CM | POA: Diagnosis not present

## 2020-05-17 DIAGNOSIS — R41841 Cognitive communication deficit: Secondary | ICD-10-CM | POA: Diagnosis not present

## 2020-05-17 DIAGNOSIS — R2689 Other abnormalities of gait and mobility: Secondary | ICD-10-CM | POA: Diagnosis not present

## 2020-05-17 DIAGNOSIS — R531 Weakness: Secondary | ICD-10-CM | POA: Diagnosis not present

## 2020-05-17 DIAGNOSIS — G43909 Migraine, unspecified, not intractable, without status migrainosus: Secondary | ICD-10-CM | POA: Diagnosis not present

## 2020-05-17 DIAGNOSIS — G4733 Obstructive sleep apnea (adult) (pediatric): Secondary | ICD-10-CM | POA: Diagnosis not present

## 2020-05-17 DIAGNOSIS — M542 Cervicalgia: Secondary | ICD-10-CM | POA: Diagnosis not present

## 2020-05-17 DIAGNOSIS — R519 Headache, unspecified: Secondary | ICD-10-CM | POA: Diagnosis not present

## 2020-05-17 DIAGNOSIS — Z7409 Other reduced mobility: Secondary | ICD-10-CM | POA: Diagnosis not present

## 2020-05-18 DIAGNOSIS — H04123 Dry eye syndrome of bilateral lacrimal glands: Secondary | ICD-10-CM | POA: Diagnosis not present

## 2020-05-18 DIAGNOSIS — I6609 Occlusion and stenosis of unspecified middle cerebral artery: Secondary | ICD-10-CM | POA: Diagnosis not present

## 2020-05-18 DIAGNOSIS — E119 Type 2 diabetes mellitus without complications: Secondary | ICD-10-CM | POA: Diagnosis not present

## 2020-05-18 DIAGNOSIS — H5319 Other subjective visual disturbances: Secondary | ICD-10-CM | POA: Diagnosis not present

## 2020-05-18 DIAGNOSIS — H35371 Puckering of macula, right eye: Secondary | ICD-10-CM | POA: Diagnosis not present

## 2020-05-18 LAB — HM DIABETES EYE EXAM

## 2020-05-19 ENCOUNTER — Ambulatory Visit: Payer: BC Managed Care – PPO | Admitting: Family Medicine

## 2020-05-19 ENCOUNTER — Encounter: Payer: Self-pay | Admitting: Family Medicine

## 2020-05-19 ENCOUNTER — Ambulatory Visit (INDEPENDENT_AMBULATORY_CARE_PROVIDER_SITE_OTHER): Payer: BC Managed Care – PPO | Admitting: Psychology

## 2020-05-19 ENCOUNTER — Other Ambulatory Visit: Payer: Self-pay

## 2020-05-19 VITALS — BP 114/70 | Ht 62.0 in | Wt 235.0 lb

## 2020-05-19 DIAGNOSIS — M170 Bilateral primary osteoarthritis of knee: Secondary | ICD-10-CM | POA: Diagnosis not present

## 2020-05-19 DIAGNOSIS — F4481 Dissociative identity disorder: Secondary | ICD-10-CM

## 2020-05-19 DIAGNOSIS — I69923 Fluency disorder following unspecified cerebrovascular disease: Secondary | ICD-10-CM | POA: Diagnosis not present

## 2020-05-19 DIAGNOSIS — F4312 Post-traumatic stress disorder, chronic: Secondary | ICD-10-CM

## 2020-05-19 DIAGNOSIS — R519 Headache, unspecified: Secondary | ICD-10-CM | POA: Diagnosis not present

## 2020-05-19 DIAGNOSIS — I69918 Other symptoms and signs involving cognitive functions following unspecified cerebrovascular disease: Secondary | ICD-10-CM | POA: Diagnosis not present

## 2020-05-19 DIAGNOSIS — R29898 Other symptoms and signs involving the musculoskeletal system: Secondary | ICD-10-CM | POA: Diagnosis not present

## 2020-05-19 DIAGNOSIS — R278 Other lack of coordination: Secondary | ICD-10-CM | POA: Diagnosis not present

## 2020-05-19 DIAGNOSIS — R41841 Cognitive communication deficit: Secondary | ICD-10-CM | POA: Diagnosis not present

## 2020-05-19 DIAGNOSIS — R2990 Unspecified symptoms and signs involving the nervous system: Secondary | ICD-10-CM | POA: Diagnosis not present

## 2020-05-19 DIAGNOSIS — R531 Weakness: Secondary | ICD-10-CM | POA: Diagnosis not present

## 2020-05-19 DIAGNOSIS — Z7409 Other reduced mobility: Secondary | ICD-10-CM | POA: Diagnosis not present

## 2020-05-19 DIAGNOSIS — R2689 Other abnormalities of gait and mobility: Secondary | ICD-10-CM | POA: Diagnosis not present

## 2020-05-19 DIAGNOSIS — I69911 Memory deficit following unspecified cerebrovascular disease: Secondary | ICD-10-CM | POA: Diagnosis not present

## 2020-05-19 NOTE — Progress Notes (Addendum)
PCP: Laurey Morale, MD  Subjective:   HPI: Patient is a 44 y.o. female here for bilateral knee injections.  7/12: Patient returns today to start gelsyn injections for severe medial arthritis bilateral knees. Pain medial bilaterally. No new injuries. Was released from hospital with diagnosis of complicated migraine after CT, MRI were reassuring.  7/21: Patient reports overall she's doing well. Returns for second gelsyn injections. No rash.  Past Medical History:  Diagnosis Date  . Anxiety   . Arthritis   . Asthma   . Bowel obstruction ([place])   . Cardiac arrhythmia   . Depression    sees Dr. Matilde Haymaker in [place]   . Diabetes mellitus    sees Dr. Larna Daughters Averneni   . GERD (gastroesophageal reflux disease)   . Hyperlipidemia   . Hypertension   . Hypothyroidism    sees Dr. Elyse Hsu  . Insomnia   . Migraine syndrome    sees Dr. Teodora Medici at [facility]   . Morbid obesity [facility])   . 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 Neck pain   . Neuropathy associated with endocrine disorder ([place])   . Sleep apnea with use of continuous positive airway pressure (CPAP)    Sleep apnea is resolved due to weight loss. 08/2017  . 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 Stroke [facility]) 04/2016   sees Dr. Teodora Medici at [facility]     Current Outpatient Medications on File Prior to Visit  Medication Sig Dispense Refill  . albuterol (PROAIR HFA) 108 (90 Base) MCG/ACT inhaler Inhale 2 puffs into the lungs every 4 (four) hours as needed for wheezing. 8.5 g 11  . ARIPiprazole (ABILIFY) 30 MG tablet Take 30 mg by mouth daily.    . baclofen (LIORESAL) 10 MG tablet     . budesonide-formoterol (SYMBICORT) 160-4.5 MCG/ACT inhaler Inhale 2 puffs into the lungs 2 (two) times daily. 1 Inhaler 11  . buPROPion (WELLBUTRIN XL) 150 MG 24 hr tablet     . ciprofloxacin (CIPRO) 500 MG tablet Take 1 tablet (500 mg total) by mouth 2 (two) times daily. 14 tablet 0  . clopidogrel (PLAVIX) 75 MG tablet TAKE ONE TABLET  BY MOUTH DAILY 90 tablet 0  . diphenoxylate-atropine (LOMOTIL) 2.5-0.025 MG tablet Take 1 tablet by mouth every 6 (six) hours as needed for diarrhea or loose stools. 30 tablet 0  . gabapentin (NEURONTIN) 300 MG capsule Take 1 capsule (300 mg total) by mouth 3 (three) times daily. (Patient taking differently: Take 300 mg by mouth 3 (three) times daily. Pt is increasing the dosage to 400 mg TID. May take 500 mg qhs.) 90 capsule 2  . HYDROcodone-acetaminophen (NORCO) 5-325 MG tablet Take 1 tablet by mouth every 6 (six) hours as needed. 20 tablet 0  . hydrOXYzine (ATARAX/VISTARIL) 50 MG tablet Take 50 mg by mouth. Take 1 tablet every night and may take 3 other times as needed    . hydrOXYzine (VISTARIL) 50 MG capsule Take 50 mg by mouth 3 (three) times daily.    . hyoscyamine (LEVSIN SL) 0.125 MG SL tablet Place 1 tablet (0.125 mg total) under the tongue every 6 (six) hours as needed. 30 tablet 1  . Ibuprofen-Famotidine 800-26.6 MG TABS Take 1 tablet by mouth 3 (three) times daily. 90 tablet 3  . insulin aspart (NOVOLOG) 100 UNIT/ML injection Use in the insulin pump as directed 10 mL 0  . levothyroxine (SYNTHROID, LEVOTHROID) 125 MCG tablet Take 1 tablet (125 mcg  total) by mouth daily before breakfast. 90 tablet 3  . methylPREDNISolone (MEDROL DOSEPAK) 4 MG TBPK tablet As directed 21 tablet 0  . metoprolol succinate (TOPROL-XL) 25 MG 24 hr tablet Take 0.5 tablets (12.5 mg total) by mouth daily. 30 tablet 0  . Multiple Vitamin (MULTIVITAMIN) tablet Take 1 tablet by mouth daily.    . norethindrone (MICRONOR,CAMILA,ERRIN) 0.35 MG tablet     . NYAMYC powder APPLY TO AFFECTED AREAS FOUR TIMES A DAY 15 g 3  . pantoprazole (PROTONIX) 40 MG tablet Take 1 tablet (40 mg total) by mouth 2 (two) times daily. 60 tablet 11  . prazosin (MINIPRESS) 5 MG capsule Take 10 mg by mouth at bedtime.    . promethazine (PHENERGAN) 25 MG tablet Take 1 tablet (25 mg total) by mouth every 4 (four) hours as needed for nausea or  vomiting. 60 tablet 1  . rosuvastatin (CRESTOR) 20 MG tablet Take 1 tablet (20 mg total) by mouth daily. 30 tablet 6  . tiZANidine (ZANAFLEX) 2 MG tablet TAKE ONE TABLET BY MOUTH EVERY 8 HOURS AS NEEDED FOR MUSCLE SPASMS 60 tablet 1  . topiramate (TOPAMAX) 100 MG tablet Take 100 mg by mouth 2 (two) times daily.     . traZODone (DESYREL) 150 MG tablet Take by mouth at bedtime.    . TRESIBA FLEXTOUCH 100 UNIT/ML SOPN FlexTouch Pen     . valACYclovir (VALTREX) 1000 MG tablet Take 1 tablet (1,000 mg total) by mouth 3 (three) times daily. 30 tablet 0   No current facility-administered medications on file prior to visit.    Past Surgical History:  Procedure Laterality Date  . blocked itestinal repair  67   age 42 months  . BREAST BIOPSY Left 2017  . CHOLECYSTECTOMY  2011  . INDUCED ABORTION  1996   forced abortion  . INGUINAL HERNIA REPAIR Left 1980   age 61  . LAPAROSCOPIC ENDOMETRIOSIS FULGURATION  1998  . WISDOM TOOTH EXTRACTION  2007    Allergies  Allergen Reactions  . Macrobid [Nitrofurantoin] Hives  . Metformin And Related Other (See Comments)    Abdominal cramping   . Byetta 10 Mcg Pen [Exenatide] Hives  . Clindamycin/Lincomycin Nausea And Vomiting  . Geodon [Ziprasidone Hcl]   . Lipitor [Atorvastatin]     Per patient this causes cramps  . Nsaids     Upset stomach   . Penicillins Other (See Comments)    Has patient had a PCN reaction causing immediate rash, facial/tongue/throat swelling, SOB or lightheadedness with hypotension: No Has patient had a PCN reaction causing severe rash involving mucus membranes or skin necrosis: No Has patient had a PCN reaction that required hospitalization No Has patient had a PCN reaction occurring within the last 10 years: No If all of the above answers are "NO", then may proceed with Cephalosporin use.   Danelle Berry [Dulaglutide]     Abdominal pain   . Avocado Diarrhea and Other (See Comments)    Severe cramping, sweats, and diarrhea  in upper GI/stomach  . Tramadol Hcl Itching and Rash    Social History   Socioeconomic History  . Marital status: Significant Other    Spouse name: Not on file  . Number of children: 5  . Years of education: Not on file  . Highest education level: Not on file  Occupational History  . Occupation: Nurse, learning disability  Tobacco Use  . Smoking status: Current Every Day Smoker    Packs/day: 0.50    Years:  0.50    Pack years: 0.25    Types: Cigarettes    Start date: 02/24/2018  . Smokeless tobacco: Never Used  . Tobacco comment: has decreased how much - trying to quit  Vaping Use  . Vaping Use: Never used  Substance and Sexual Activity  . Alcohol use: No    Alcohol/week: 0.0 standard drinks  . Drug use: Yes    Types: Marijuana    Comment: 2/10 last marijuana use  . Sexual activity: Not on file  Other Topics Concern  . Not on file  Social History Narrative  . Not on file   Social Determinants of Health   Financial Resource Strain:   . Difficulty of Paying Living Expenses:   Food Insecurity:   . Worried About Charity fundraiser in the Last Year:   . Arboriculturist in the Last Year:   Transportation Needs:   . Film/video editor (Medical):   Marland Kitchen Lack of Transportation (Non-Medical):   Physical Activity:   . Days of Exercise per Week:   . Minutes of Exercise per Session:   Stress:   . Feeling of Stress :   Social Connections:   . Frequency of Communication with Friends and Family:   . Frequency of Social Gatherings with Friends and Family:   . Attends Religious Services:   . Active Member of Clubs or Organizations:   . Attends Archivist Meetings:   Marland Kitchen Marital Status:   Intimate Partner Violence:   . Fear of Current or Ex-Partner:   . Emotionally Abused:   Marland Kitchen Physically Abused:   . Sexually Abused:     Family History  Adopted: Yes  Problem Relation Age of Onset  . Heart disease Other        on both sides of family  . Diabetes Other         on both sides of family    BP 114/70   Ht 5\' 2"  (1.575 m)   Wt 235 lb (106.6 kg)   BMI 42.98 kg/m   Review of Systems: See HPI above.     Objective:  Physical Exam:  Gen: NAD, comfortable in exam room  Rest of exam not repeated today.   Assessment & Plan:  1. Bilateral knee arthritis - lack of improvement to date with conservative measures.  Second gelsyn injections given today bilaterally.  Previously discussed tylenol, topical medications, supplements, home exercises.  F/u in 1 week for third injections.  After informed written consent timeout was performed, patient was seated on exam table. Right knee was prepped with alcohol swab and utilizing anteromedial approach, patient's right knee was injected intraarticularly with 49mL bupivicaine followed by gelsyn. Patient tolerated the procedure well without immediate complications.  After informed written consent timeout was performed, patient was seated on exam table. Left knee was prepped with alcohol swab and utilizing anteromedial approach, patient's Left knee was injected intraarticularly with 56mL bupivicaine followed by gelsyn. Patient tolerated the procedure well without immediate complications.

## 2020-05-20 DIAGNOSIS — R531 Weakness: Secondary | ICD-10-CM | POA: Diagnosis not present

## 2020-05-20 DIAGNOSIS — R2990 Unspecified symptoms and signs involving the nervous system: Secondary | ICD-10-CM | POA: Diagnosis not present

## 2020-05-20 DIAGNOSIS — Z9889 Other specified postprocedural states: Secondary | ICD-10-CM | POA: Diagnosis not present

## 2020-05-20 DIAGNOSIS — R278 Other lack of coordination: Secondary | ICD-10-CM | POA: Diagnosis not present

## 2020-05-21 ENCOUNTER — Encounter: Payer: Self-pay | Admitting: Family Medicine

## 2020-05-26 ENCOUNTER — Ambulatory Visit: Payer: BC Managed Care – PPO | Admitting: Family Medicine

## 2020-05-26 ENCOUNTER — Other Ambulatory Visit: Payer: Self-pay

## 2020-05-26 ENCOUNTER — Encounter: Payer: Self-pay | Admitting: Family Medicine

## 2020-05-26 VITALS — BP 114/63 | Ht 62.0 in | Wt 235.0 lb

## 2020-05-26 DIAGNOSIS — R2689 Other abnormalities of gait and mobility: Secondary | ICD-10-CM | POA: Diagnosis not present

## 2020-05-26 DIAGNOSIS — M170 Bilateral primary osteoarthritis of knee: Secondary | ICD-10-CM | POA: Diagnosis not present

## 2020-05-26 DIAGNOSIS — I69911 Memory deficit following unspecified cerebrovascular disease: Secondary | ICD-10-CM | POA: Diagnosis not present

## 2020-05-26 DIAGNOSIS — I69923 Fluency disorder following unspecified cerebrovascular disease: Secondary | ICD-10-CM | POA: Diagnosis not present

## 2020-05-26 DIAGNOSIS — R531 Weakness: Secondary | ICD-10-CM | POA: Diagnosis not present

## 2020-05-26 DIAGNOSIS — R278 Other lack of coordination: Secondary | ICD-10-CM | POA: Diagnosis not present

## 2020-05-26 DIAGNOSIS — R41841 Cognitive communication deficit: Secondary | ICD-10-CM | POA: Diagnosis not present

## 2020-05-26 DIAGNOSIS — Z7409 Other reduced mobility: Secondary | ICD-10-CM | POA: Diagnosis not present

## 2020-05-26 DIAGNOSIS — R519 Headache, unspecified: Secondary | ICD-10-CM | POA: Diagnosis not present

## 2020-05-26 DIAGNOSIS — I69918 Other symptoms and signs involving cognitive functions following unspecified cerebrovascular disease: Secondary | ICD-10-CM | POA: Diagnosis not present

## 2020-05-26 DIAGNOSIS — R29898 Other symptoms and signs involving the musculoskeletal system: Secondary | ICD-10-CM | POA: Diagnosis not present

## 2020-05-26 DIAGNOSIS — R2990 Unspecified symptoms and signs involving the nervous system: Secondary | ICD-10-CM | POA: Diagnosis not present

## 2020-05-26 NOTE — Progress Notes (Addendum)
PCP: Laurey Morale, MD  Subjective:   HPI: Miranda [name] is a 44 y.o. female here for bilateral knee injections.  7/12: Miranda [name] returns today to start synvisc injections for severe medial arthritis bilateral knees. Pain medial bilaterally. No new injuries. Was released from hospital with diagnosis of complicated migraine after CT, MRI were reassuring.  7/21: Miranda [name] reports overall she's doing well. Returns for second synvisc injections. No rash.  7/28: Miranda [name] reports her right knee is feeling some better the left knee may be feels a little bit worse. She returns for her third Gelsyn injections. No rash.  Past Medical History:  Diagnosis Date  . Anxiety   . Arthritis   . Asthma   . Bowel obstruction ([place])   . Cardiac arrhythmia   . Depression    sees Dr. Matilde Haymaker in [place]   . Diabetes mellitus    sees Dr. Larna Daughters [place]verneni   . GERD (gastroesophageal reflux disease)   . Hyperlipidemia   . Hypertension   . Hypothyroidism    sees Dr. Elyse Hsu  . Insomnia   . Migraine syndrome    sees Dr. Teod[facility] Medici at [facility]   . Morbid obesity [facility])   . 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 Neck pain   . Neuropathy associated with endocrine disorder ([place])   . Sleep apnea with use of continuous positive airway pressure (CPAP)    Sleep apnea is resolved due to weight loss. 08/2017  . 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 Stroke [facility]) 04/2016   sees Dr. Teodora Medici at [facility]     Current Outpatient Medications on File Prior to Visit  Medication Sig Dispense Refill  . albuterol (PROAIR HFA) 108 (90 Base) MCG/ACT inhaler Inhale 2 puffs into the lungs every 4 (four) hours as needed for wheezing. 8.5 g 11  . ARIPiprazole (ABILIFY) 30 MG tablet Take 30 mg by mouth daily.    . baclofen (LIORESAL) 10 MG tablet     . budesonide-formoterol (SYMBICORT) 160-4.5 MCG/ACT inhaler Inhale 2 puffs into the lungs 2 (two) times daily. 1 Inhaler 11  . buPROPion (WELLBUTRIN XL) 150 MG 24 hr  tablet     . ciprofloxacin (CIPRO) 500 MG tablet Take 1 tablet (500 mg total) by mouth 2 (two) times daily. 14 tablet 0  . clopidogrel (PLAVIX) 75 MG tablet TAKE ONE TABLET BY MOUTH DAILY 90 tablet 0  . diphenoxylate-atropine (LOMOTIL) 2.5-0.025 MG tablet Take 1 tablet by mouth every 6 (six) hours as needed for diarrhea or loose stools. 30 tablet 0  . gabapentin (NEURONTIN) 300 MG capsule Take 1 capsule (300 mg total) by mouth 3 (three) times daily. (Miranda [name] taking differently: Take 300 mg by mouth 3 (three) times daily. Pt is increasing the dosage to 400 mg TID. May take 500 mg qhs.) 90 capsule 2  . HYDROcodone-acetaminophen (NORCO) 5-325 MG tablet Take 1 tablet by mouth every 6 (six) hours as needed. 20 tablet 0  . hydrOXYzine (ATARAX/VISTARIL) 50 MG tablet Take 50 mg by mouth. Take 1 tablet every night and may take 3 other times as needed    . hydrOXYzine (VISTARIL) 50 MG capsule Take 50 mg by mouth 3 (three) times daily.    . hyoscyamine (LEVSIN SL) 0.125 MG SL tablet Place 1 tablet (0.125 mg total) under the tongue every 6 (six) hours as needed. 30 tablet 1  . Ibuprofen-Famotidine 800-26.6 MG TABS Take 1 tablet by mouth 3 (three) times daily. 90 tablet 3  .  insulin aspart (NOVOLOG) 100 UNIT/ML injection Use in the insulin pump as directed 10 mL 0  . levothyroxine (SYNTHROID, LEVOTHROID) 125 MCG tablet Take 1 tablet (125 mcg total) by mouth daily before breakfast. 90 tablet 3  . methylPREDNISolone (MEDROL DOSEPAK) 4 MG TBPK tablet As directed 21 tablet 0  . metoprolol succinate (TOPROL-XL) 25 MG 24 hr tablet Take 0.5 tablets (12.5 mg total) by mouth daily. 30 tablet 0  . Multiple Vitamin (MULTIVITAMIN) tablet Take 1 tablet by mouth daily.    . norethindrone (MICRONOR,CAMILA,ERRIN) 0.35 MG tablet     . NYAMYC powder APPLY TO AFFECTED AREAS FOUR TIMES A DAY 15 g 3  . pantoprazole (PROTONIX) 40 MG tablet Take 1 tablet (40 mg total) by mouth 2 (two) times daily. 60 tablet 11  . prazosin (MINIPRESS)  5 MG capsule Take 10 mg by mouth at bedtime.    . promethazine (PHENERGAN) 25 MG tablet Take 1 tablet (25 mg total) by mouth every 4 (four) hours as needed for nausea or vomiting. 60 tablet 1  . rosuvastatin (CRESTOR) 20 MG tablet Take 1 tablet (20 mg total) by mouth daily. 30 tablet 6  . tiZANidine (ZANAFLEX) 2 MG tablet TAKE ONE TABLET BY MOUTH EVERY 8 HOURS AS NEEDED FOR MUSCLE SPASMS 60 tablet 1  . topiramate (TOPAMAX) 100 MG tablet Take 100 mg by mouth 2 (two) times daily.     . traZODone (DESYREL) 150 MG tablet Take by mouth at bedtime.    . TRESIBA FLEXTOUCH 100 UNIT/ML SOPN FlexTouch Pen     . valACYclovir (VALTREX) 1000 MG tablet Take 1 tablet (1,000 mg total) by mouth 3 (three) times daily. 30 tablet 0   No current facility-administered medications on file prior to visit.    Past Surgical History:  Procedure Laterality Date  . blocked itestinal repair  44   age 41 months  . BREAST BIOPSY Left 2017  . CHOLECYSTECTOMY  2011  . INDUCED ABORTION  1996   forced abortion  . INGUINAL HERNIA REPAIR Left 1980   age 47  . LAPAROSCOPIC ENDOMETRIOSIS FULGURATION  1998  . WISDOM TOOTH EXTRACTION  2007    Allergies  Allergen Reactions  . Macrobid [Nitrofurantoin] Hives  . Metformin And Related Other (See Comments)    Abdominal cramping   . Byetta 10 Mcg Pen [Exenatide] Hives  . Clindamycin/Lincomycin Nausea And Vomiting  . Geodon [Ziprasidone Hcl]   . Lipitor [Atorvastatin]     Per Miranda [name] this causes cramps  . Nsaids     Upset stomach   . Penicillins Other (See Comments)    Has Miranda [name] had a PCN reaction causing immediate rash, facial/tongue/throat swelling, SOB or lightheadedness with hypotension: No Has Miranda [name] had a PCN reaction causing severe rash involving mucus membranes or skin necrosis: No Has Miranda [name] had a PCN reaction that required hospitalization No Has Miranda [name] had a PCN reaction occurring within the last 10 years: No If all of the above answers are "NO", then may  proceed with Cephalosporin use.   Danelle Berry [Dulaglutide]     Abdominal pain   . Avocado Diarrhea and Other (See Comments)    Severe cramping, sweats, and diarrhea in upper GI/stomach  . Tramadol Hcl Itching and Rash    Social History   Socioeconomic History  . Marital status: Significant Other    Spouse name: Not on file  . Number of children: 5  . Years of education: Not on file  . Highest education level: Not on file  Occupational History  . Occupation: Nurse, learning disability  Tobacco Use  . Smoking status: Current Every Day Smoker    Packs/day: 0.50    Years: 0.50    Pack years: 0.25    Types: Cigarettes    Start date: 02/24/2018  . Smokeless tobacco: Never Used  . Tobacco comment: has decreased how much - trying to quit  Vaping Use  . Vaping Use: Never used  Substance and Sexual Activity  . Alcohol use: No    Alcohol/week: 0.0 standard drinks  . Drug use: Yes    Types: Marijuana    Comment: 2/10 last marijuana use  . Sexual activity: Not on file  Other Topics Concern  . Not on file  Social History Narrative  . Not on file   Social Determinants of Health   Financial Resource Strain:   . Difficulty of Paying Living Expenses:   Food Insecurity:   . Worried About Charity fundraiser in the Last Year:   . Arboriculturist in the Last Year:   Transportation Needs:   . Film/video editor (Medical):   Marland Kitchen Lack of Transportation (Non-Medical):   Physical Activity:   . Days of Exercise per Week:   . Minutes of Exercise per Session:   Stress:   . Feeling of Stress :   Social Connections:   . Frequency of Communication with Friends and Family:   . Frequency of Social Gatherings with Friends and Family:   . Attends Religious Services:   . Active Member of Clubs or Organizations:   . Attends Archivist Meetings:   Marland Kitchen Marital Status:   Intimate Partner Violence:   . Fear of Current or Ex-Partner:   . Emotionally Abused:   Marland Kitchen Physically  Abused:   . Sexually Abused:     Family History  Adopted: Yes  Problem Relation Age of Onset  . Heart disease Other        on both sides of family  . Diabetes Other        on both sides of family    BP (!) 114/63   Ht 5\' 2"  (1.575 m)   Wt (!) 235 lb (106.6 kg)   BMI 42.98 kg/m   Review of Systems: See HPI above.     Objective:  Physical Exam:  Gen: NAD, comfortable in exam room  Rest of exam not repeated today.  Assessment & Plan:  1. Bilateral knee arthritis -lack of improvement to date with conservative measures.  Third Gelsyn injections given today bilaterally.  Previously discussed Tylenol, topical medications, supplements, home exercises.  Advised her to call me in 3 to 4 weeks to let me know how she is doing.  We can consider the fourth injections depending on her status.  After informed written consent timeout was performed, Miranda [name] was seated on exam table.  Right knee was prepped with alcohol swab and utilizing anteromedial approach, Miranda [name]'s right knee was injected intra-articularly with 3 mL bupivacaine followed by Lanny Hurst.  Miranda [name] tolerated the procedure well without immediate complications.  After informed written consent timeout was performed.  Miranda [name] was seated on exam table.  Left knee was prepped with alcohol swab and utilizing anteromedial approach, Miranda [name]'s left knee was injected intra-articularly with 3 mL bupivacaine followed by Lanny Hurst.  Miranda [name] tolerated the procedure well without immediate complications.

## 2020-06-07 ENCOUNTER — Ambulatory Visit: Payer: BC Managed Care – PPO | Admitting: Psychology

## 2020-06-08 ENCOUNTER — Ambulatory Visit: Payer: BC Managed Care – PPO | Admitting: Psychology

## 2020-06-15 DIAGNOSIS — S199XXA Unspecified injury of neck, initial encounter: Secondary | ICD-10-CM | POA: Diagnosis not present

## 2020-06-15 DIAGNOSIS — M47812 Spondylosis without myelopathy or radiculopathy, cervical region: Secondary | ICD-10-CM | POA: Diagnosis not present

## 2020-06-21 ENCOUNTER — Ambulatory Visit: Payer: BC Managed Care – PPO | Admitting: Family Medicine

## 2020-06-21 ENCOUNTER — Ambulatory Visit: Payer: BC Managed Care – PPO | Admitting: Psychology

## 2020-06-21 ENCOUNTER — Other Ambulatory Visit: Payer: Self-pay

## 2020-06-21 DIAGNOSIS — N390 Urinary tract infection, site not specified: Secondary | ICD-10-CM | POA: Diagnosis not present

## 2020-06-21 DIAGNOSIS — R319 Hematuria, unspecified: Secondary | ICD-10-CM | POA: Diagnosis not present

## 2020-06-22 ENCOUNTER — Ambulatory Visit (INDEPENDENT_AMBULATORY_CARE_PROVIDER_SITE_OTHER): Payer: BC Managed Care – PPO | Admitting: Psychology

## 2020-06-22 DIAGNOSIS — F4481 Dissociative identity disorder: Secondary | ICD-10-CM

## 2020-06-25 DIAGNOSIS — Z20822 Contact with and (suspected) exposure to covid-19: Secondary | ICD-10-CM | POA: Diagnosis not present

## 2020-06-27 DIAGNOSIS — S46811A Strain of other muscles, fascia and tendons at shoulder and upper arm level, right arm, initial encounter: Secondary | ICD-10-CM | POA: Diagnosis not present

## 2020-06-27 DIAGNOSIS — M25531 Pain in right wrist: Secondary | ICD-10-CM | POA: Diagnosis not present

## 2020-06-27 DIAGNOSIS — M79601 Pain in right arm: Secondary | ICD-10-CM | POA: Diagnosis not present

## 2020-06-27 DIAGNOSIS — M79641 Pain in right hand: Secondary | ICD-10-CM | POA: Diagnosis not present

## 2020-06-29 ENCOUNTER — Ambulatory Visit (INDEPENDENT_AMBULATORY_CARE_PROVIDER_SITE_OTHER): Payer: BC Managed Care – PPO | Admitting: Psychology

## 2020-06-29 ENCOUNTER — Other Ambulatory Visit: Payer: Self-pay | Admitting: Family Medicine

## 2020-06-29 DIAGNOSIS — F4481 Dissociative identity disorder: Secondary | ICD-10-CM | POA: Diagnosis not present

## 2020-06-30 ENCOUNTER — Other Ambulatory Visit: Payer: Self-pay

## 2020-06-30 ENCOUNTER — Encounter: Payer: Self-pay | Admitting: Family Medicine

## 2020-06-30 ENCOUNTER — Ambulatory Visit: Payer: BC Managed Care – PPO | Admitting: Family Medicine

## 2020-06-30 VITALS — BP 121/61 | Ht 62.0 in | Wt 233.0 lb

## 2020-06-30 DIAGNOSIS — M25552 Pain in left hip: Secondary | ICD-10-CM

## 2020-06-30 DIAGNOSIS — M25511 Pain in right shoulder: Secondary | ICD-10-CM | POA: Diagnosis not present

## 2020-06-30 MED ORDER — METHYLPREDNISOLONE ACETATE 40 MG/ML IJ SUSP
40.0000 mg | Freq: Once | INTRAMUSCULAR | Status: AC
  Administered 2020-06-30: 40 mg via INTRA_ARTICULAR

## 2020-06-30 MED ORDER — DUEXIS 800-26.6 MG PO TABS: 1.0000 | ORAL_TABLET | Freq: Three times a day (TID) | ORAL | 2 refills | Status: DC | PRN

## 2020-06-30 NOTE — Progress Notes (Signed)
PCP: Laurey Morale, MD  Subjective:   HPI: Patient is a 44 y.o. female here for right shoulder, left hip pain.  Patient reports her right shoulder started hurting about a week ago and severely worsened over the weekend. Pain was localized to upper arm initially but has progressed to include shoulder. Had IM steroid at urgent care but without much benefit. She did kayak 2 weeks ago but didn't have pain in days following this. No acute injury or trauma.  Also noted left groin/anterior hip pain has been on and off for past several months. Gets popping and cracking here. Worse getting in and out of car and out of bed. No radiation. No numbness/tingling.  Past Medical History:  Diagnosis Date   Anxiety    Arthritis    Asthma    Bowel obstruction (HCC)    Cardiac arrhythmia    Depression    sees Dr. Matilde Haymaker in [place]    Diabetes mellitus    sees Dr. Madelin Rear    GERD (gastroesophageal reflux disease)    Hyperlipidemia    Hypertension    Hypothyroidism    sees Dr. Elyse Hsu   Insomnia    Migraine syndrome    sees Dr. 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 Teod[facility] Medici at [facility]     Current Outpatient Medications on File Prior to Visit  Medication Sig Dispense Refill   albuterol (PROAIR HFA) 108 (90 Base) MCG/ACT inhaler Inhale 2 puffs into the lungs every 4 (four) hours as needed for wheezing. 8.5 g 11   ARIPiprazole (ABILIFY) 30 MG tablet Take 30 mg by mouth daily.     baclofen (LIORESAL) 10 MG tablet      budesonide-formoterol (SYMBICORT) 160-4.5 MCG/ACT inhaler Inhale 2 puffs into the lungs 2 (two) times daily. 1 Inhaler 11    buPROPion (WELLBUTRIN XL) 150 MG 24 hr tablet      ciprofloxacin (CIPRO) 500 MG tablet Take 1 tablet (500 mg total) by mouth 2 (two) times daily. 14 tablet 0   clopidogrel (PLAVIX) 75 MG tablet TAKE ONE TABLET BY MOUTH DAILY 90 tablet 0   diphenoxylate-atropine (LOMOTIL) 2.5-0.025 MG tablet Take 1 tablet by mouth every 6 (six) hours as needed for diarrhea or loose stools. 30 tablet 0   gabapentin (NEURONTIN) 300 MG capsule Take 1 capsule (300 mg total) by mouth 3 (three) times daily. (Patient taking differently: Take 300 mg by mouth 3 (three) times daily. Pt is increasing the dosage to 400 mg TID. May take 500 mg qhs.) 90 capsule 2   HYDROcodone-acetaminophen (NORCO) 5-325 MG tablet Take 1 tablet by mouth every 6 (six) hours as needed. 20 tablet 0   hydrOXYzine (ATARAX/VISTARIL) 50 MG tablet Take 50 mg by mouth. Take 1 tablet every night and may take 3 other times as needed     hydrOXYzine (VISTARIL) 50 MG capsule Take 50 mg by mouth 3 (three) times daily.     hyoscyamine (LEVSIN SL) 0.125 MG SL tablet Place 1 tablet (0.125 mg total) under the tongue every 6 (six) hours as needed. 30 tablet 1  Ibuprofen-Famotidine 800-26.6 MG TABS Take 1 tablet by mouth 3 (three) times daily. 90 tablet 3   insulin aspart (NOVOLOG) 100 UNIT/ML injection Use in the insulin pump as directed 10 mL 0   levothyroxine (SYNTHROID, LEVOTHROID) 125 MCG tablet Take 1 tablet (125 mcg total) by mouth daily before breakfast. 90 tablet 3   methylPREDNISolone (MEDROL DOSEPAK) 4 MG TBPK tablet As directed 21 tablet 0   metoprolol succinate (TOPROL-XL) 25 MG 24 hr tablet Take 0.5 tablets (12.5 mg total) by mouth daily. 30 tablet 0   Multiple Vitamin (MULTIVITAMIN) tablet Take 1 tablet by mouth daily.     norethindrone (MICRONOR,CAMILA,ERRIN) 0.35 MG tablet      NYAMYC powder APPLY TO AFFECTED AREAS FOUR TIMES A DAY 15 g 3   pantoprazole (PROTONIX) 40 MG tablet Take 1 tablet (40 mg total) by mouth 2 (two) times  daily. 60 tablet 11   prazosin (MINIPRESS) 5 MG capsule Take 10 mg by mouth at bedtime.     promethazine (PHENERGAN) 25 MG tablet Take 1 tablet (25 mg total) by mouth every 4 (four) hours as needed for nausea or vomiting. 60 tablet 1   rosuvastatin (CRESTOR) 20 MG tablet Take 1 tablet (20 mg total) by mouth daily. 30 tablet 6   tiZANidine (ZANAFLEX) 2 MG tablet TAKE ONE TABLET BY MOUTH EVERY 8 HOURS AS NEEDED FOR MUSCLE SPASMS 60 tablet 1   topiramate (TOPAMAX) 100 MG tablet Take 100 mg by mouth 2 (two) times daily.      traZODone (DESYREL) 150 MG tablet Take by mouth at bedtime.     TRESIBA FLEXTOUCH 100 UNIT/ML SOPN FlexTouch Pen      valACYclovir (VALTREX) 1000 MG tablet Take 1 tablet (1,000 mg total) by mouth 3 (three) times daily. 30 tablet 0   No current facility-administered medications on file prior to visit.    Past Surgical History:  Procedure Laterality Date   blocked itestinal repair  9   age 30 months   BREAST BIOPSY Left 2017   CHOLECYSTECTOMY  2011   INDUCED ABORTION  1996   forced abortion   INGUINAL HERNIA REPAIR Left 1980   age 60   LAPAROSCOPIC ENDOMETRIOSIS FULGURATION  1998   WISDOM TOOTH EXTRACTION  2007    Allergies  Allergen Reactions   Macrobid [Nitrofurantoin] Hives   Metformin And Related Other (See Comments)    Abdominal cramping    Byetta 10 Mcg Pen [Exenatide] Hives   Clindamycin/Lincomycin Nausea And Vomiting   Geodon [Ziprasidone Hcl]    Lipitor [Atorvastatin]     Per patient this causes cramps   Nsaids     Upset stomach    Penicillins Other (See Comments)    Has patient had a PCN reaction causing immediate rash, facial/tongue/throat swelling, SOB or lightheadedness with hypotension: No Has patient had a PCN reaction causing severe rash involving mucus membranes or skin necrosis: No Has patient had a PCN reaction that required hospitalization No Has patient had a PCN reaction occurring within the last 10 years: No If  all of the above answers are "NO", then may proceed with Cephalosporin use.    Trulicity [Dulaglutide]     Abdominal pain    Avocado Diarrhea and Other (See Comments)    Severe cramping, sweats, and diarrhea in upper GI/stomach   Tramadol Hcl Itching and Rash    Social History   Socioeconomic History   Marital status: Significant Other    Spouse name: Not on file   Number of  children: 5   Years of education: Not on file   Highest education level: Not on file  Occupational History   Occupation: Nurse, learning disability  Tobacco Use   Smoking status: Current Every Day Smoker    Packs/day: 0.50    Years: 0.50    Pack years: 0.25    Types: Cigarettes    Start date: 02/24/2018   Smokeless tobacco: Never Used   Tobacco comment: has decreased how much - trying to quit  Vaping Use   Vaping Use: Never used  Substance and Sexual Activity   Alcohol use: No    Alcohol/week: 0.0 standard drinks   Drug use: Yes    Types: Marijuana    Comment: 2/10 last marijuana use   Sexual activity: Not on file  Other Topics Concern   Not on file  Social History Narrative   Not on file   Social Determinants of Health   Financial Resource Strain:    Difficulty of Paying Living Expenses: Not on file  Food Insecurity:    Worried About [place] in the Last Year: Not on file   Ran Out of Food in the Last Year: Not on file  Transportation Needs:    Lack of Transportation (Medical): Not on file   Lack of Transportation (Non-Medical): Not on file  Physical Activity:    Days of Exercise per Week: Not on file   Minutes of Exercise per Session: Not on file  Stress:    Feeling of Stress : Not on file  Social Connections:    Frequency of Communication with Friends and Family: Not on file   Frequency of Social Gatherings with Friends and Family: Not on file   Attends Religious Services: Not on file   Active Member of Clubs or Organizations: Not on file    Attends Archivist Meetings: Not on file   Marital Status: Not on file  Intimate Partner Violence:    Fear of Current or Ex-Partner: Not on file   Emotionally Abused: Not on file   Physically Abused: Not on file   Sexually Abused: Not on file    Family History  Adopted: Yes  Problem Relation Age of Onset   Heart disease Other        on both sides of family   Diabetes Other        on both sides of family    BP 121/61    Ht 5\' 2"  (1.575 m)    Wt 233 lb (105.7 kg)    BMI 42.62 kg/m   Review of Systems: See HPI above.     Objective:  Physical Exam:  Gen: NAD, comfortable in exam room  Right shoulder: No swelling, ecchymoses.  No gross deformity. No TTP AC joint, biceps tendon. FROM with painful arc. Positive Hawkins. Strength 5/5 with empty can and resisted internal/external rotation.  Pain empty can and ER. NV intact distally.  Left hip: No deformity. FROM with 5/5 strength. No tenderness to palpation. NVI distally. Positive logroll, fadir.  Negative faber.   Assessment & Plan:  1. Right shoulder pain - 2/2 rotator cuff impingement.  Tylenol, duexis.  Subacromial injection given today.  Consider further imaging, PT, nitro patches.  F/u in 1 month.  After informed written consent timeout was performed, patient was seated in chair in exam room. Right shoulder was prepped with alcohol swab and utilizing lateral approach with ultrasound guidance patient's right subacromial space was injected with 3:1 bupivicaine: depomedrol. Patient  tolerated the procedure well without immediate complications.  2. Left hip pain - consistent with pain from her known labral tear.  Encouraged home exercises she learned in PT.  Duexis, tylenol.  Consider repeating physical therapy, injection.

## 2020-06-30 NOTE — Patient Instructions (Signed)
You have rotator cuff impingement of your right shoulder. Try to avoid painful activities (overhead activities, lifting with extended arm) as much as possible. Duexis up to three times a day with food for pain and inflammation. Can take tylenol in addition to this. Subacromial injection may be beneficial to help with pain and to decrease inflammation - you were given this today. Consider physical therapy with transition to home exercise program. If not improving at follow-up we will consider further imaging, physical therapy, and/or nitro patches. Follow up with me in 1 month but message me sooner if you're not improving.  Your hip pain is due to a labral tear. Duexis as noted above. Do home exercises you had learned in physical therapy. Can consider repeating therapy if you wanted. Steroid injection would only be temporary for pain relief for this.

## 2020-07-01 ENCOUNTER — Telehealth: Payer: Self-pay

## 2020-07-02 DIAGNOSIS — E1165 Type 2 diabetes mellitus with hyperglycemia: Secondary | ICD-10-CM | POA: Diagnosis not present

## 2020-07-02 DIAGNOSIS — Z794 Long term (current) use of insulin: Secondary | ICD-10-CM | POA: Diagnosis not present

## 2020-07-02 DIAGNOSIS — Z6841 Body Mass Index (BMI) 40.0 and over, adult: Secondary | ICD-10-CM | POA: Diagnosis not present

## 2020-07-02 NOTE — Telephone Encounter (Signed)
Noted  

## 2020-07-07 DIAGNOSIS — E1165 Type 2 diabetes mellitus with hyperglycemia: Secondary | ICD-10-CM | POA: Diagnosis not present

## 2020-07-07 DIAGNOSIS — Z794 Long term (current) use of insulin: Secondary | ICD-10-CM | POA: Diagnosis not present

## 2020-07-12 DIAGNOSIS — M25511 Pain in right shoulder: Secondary | ICD-10-CM | POA: Diagnosis not present

## 2020-07-12 DIAGNOSIS — M6281 Muscle weakness (generalized): Secondary | ICD-10-CM | POA: Diagnosis not present

## 2020-07-12 DIAGNOSIS — M799 Soft tissue disorder, unspecified: Secondary | ICD-10-CM | POA: Diagnosis not present

## 2020-07-14 ENCOUNTER — Ambulatory Visit (INDEPENDENT_AMBULATORY_CARE_PROVIDER_SITE_OTHER): Payer: BC Managed Care – PPO | Admitting: Psychology

## 2020-07-14 DIAGNOSIS — F411 Generalized anxiety disorder: Secondary | ICD-10-CM

## 2020-07-14 DIAGNOSIS — F4481 Dissociative identity disorder: Secondary | ICD-10-CM

## 2020-07-15 DIAGNOSIS — Z95818 Presence of other cardiac implants and grafts: Secondary | ICD-10-CM | POA: Diagnosis not present

## 2020-07-15 DIAGNOSIS — M799 Soft tissue disorder, unspecified: Secondary | ICD-10-CM | POA: Diagnosis not present

## 2020-07-15 DIAGNOSIS — M6281 Muscle weakness (generalized): Secondary | ICD-10-CM | POA: Diagnosis not present

## 2020-07-15 DIAGNOSIS — M25511 Pain in right shoulder: Secondary | ICD-10-CM | POA: Diagnosis not present

## 2020-07-16 DIAGNOSIS — M25511 Pain in right shoulder: Secondary | ICD-10-CM | POA: Diagnosis not present

## 2020-07-16 DIAGNOSIS — M799 Soft tissue disorder, unspecified: Secondary | ICD-10-CM | POA: Diagnosis not present

## 2020-07-16 DIAGNOSIS — M6281 Muscle weakness (generalized): Secondary | ICD-10-CM | POA: Diagnosis not present

## 2020-07-19 ENCOUNTER — Ambulatory Visit (INDEPENDENT_AMBULATORY_CARE_PROVIDER_SITE_OTHER): Payer: BC Managed Care – PPO | Admitting: Psychology

## 2020-07-19 DIAGNOSIS — M6281 Muscle weakness (generalized): Secondary | ICD-10-CM | POA: Diagnosis not present

## 2020-07-19 DIAGNOSIS — M799 Soft tissue disorder, unspecified: Secondary | ICD-10-CM | POA: Diagnosis not present

## 2020-07-19 DIAGNOSIS — M25511 Pain in right shoulder: Secondary | ICD-10-CM | POA: Diagnosis not present

## 2020-07-19 DIAGNOSIS — F441 Dissociative fugue: Secondary | ICD-10-CM

## 2020-07-19 DIAGNOSIS — F4481 Dissociative identity disorder: Secondary | ICD-10-CM

## 2020-07-22 ENCOUNTER — Other Ambulatory Visit: Payer: Self-pay | Admitting: Family Medicine

## 2020-07-29 DIAGNOSIS — M6281 Muscle weakness (generalized): Secondary | ICD-10-CM | POA: Diagnosis not present

## 2020-07-29 DIAGNOSIS — M799 Soft tissue disorder, unspecified: Secondary | ICD-10-CM | POA: Diagnosis not present

## 2020-07-29 DIAGNOSIS — M25511 Pain in right shoulder: Secondary | ICD-10-CM | POA: Diagnosis not present

## 2020-08-02 ENCOUNTER — Ambulatory Visit (INDEPENDENT_AMBULATORY_CARE_PROVIDER_SITE_OTHER): Payer: BC Managed Care – PPO | Admitting: Psychology

## 2020-08-02 DIAGNOSIS — F4481 Dissociative identity disorder: Secondary | ICD-10-CM

## 2020-08-03 DIAGNOSIS — M6281 Muscle weakness (generalized): Secondary | ICD-10-CM | POA: Diagnosis not present

## 2020-08-03 DIAGNOSIS — F449 Dissociative and conversion disorder, unspecified: Secondary | ICD-10-CM | POA: Diagnosis not present

## 2020-08-03 DIAGNOSIS — M799 Soft tissue disorder, unspecified: Secondary | ICD-10-CM | POA: Diagnosis not present

## 2020-08-03 DIAGNOSIS — M25511 Pain in right shoulder: Secondary | ICD-10-CM | POA: Diagnosis not present

## 2020-08-03 DIAGNOSIS — F4312 Post-traumatic stress disorder, chronic: Secondary | ICD-10-CM | POA: Diagnosis not present

## 2020-08-06 DIAGNOSIS — I639 Cerebral infarction, unspecified: Secondary | ICD-10-CM | POA: Diagnosis not present

## 2020-08-06 DIAGNOSIS — M799 Soft tissue disorder, unspecified: Secondary | ICD-10-CM | POA: Diagnosis not present

## 2020-08-06 DIAGNOSIS — I63312 Cerebral infarction due to thrombosis of left middle cerebral artery: Secondary | ICD-10-CM | POA: Diagnosis not present

## 2020-08-06 DIAGNOSIS — I493 Ventricular premature depolarization: Secondary | ICD-10-CM | POA: Diagnosis not present

## 2020-08-06 DIAGNOSIS — E1165 Type 2 diabetes mellitus with hyperglycemia: Secondary | ICD-10-CM | POA: Diagnosis not present

## 2020-08-06 DIAGNOSIS — M6281 Muscle weakness (generalized): Secondary | ICD-10-CM | POA: Diagnosis not present

## 2020-08-06 DIAGNOSIS — M25511 Pain in right shoulder: Secondary | ICD-10-CM | POA: Diagnosis not present

## 2020-08-09 ENCOUNTER — Other Ambulatory Visit: Payer: Self-pay

## 2020-08-09 ENCOUNTER — Encounter: Payer: Self-pay | Admitting: Family Medicine

## 2020-08-09 ENCOUNTER — Ambulatory Visit (INDEPENDENT_AMBULATORY_CARE_PROVIDER_SITE_OTHER): Payer: BC Managed Care – PPO | Admitting: Family Medicine

## 2020-08-09 VITALS — BP 122/74 | HR 92 | Temp 98.6°F | Ht 62.0 in | Wt 235.4 lb

## 2020-08-09 DIAGNOSIS — B029 Zoster without complications: Secondary | ICD-10-CM | POA: Diagnosis not present

## 2020-08-09 MED ORDER — VALACYCLOVIR HCL 1 G PO TABS: 1000.0000 mg | ORAL_TABLET | Freq: Three times a day (TID) | ORAL | 0 refills | Status: AC

## 2020-08-09 NOTE — Progress Notes (Signed)
   Subjective:    Patient ID: Miranda [name], female    DOB: 05-25-1976, 44 y.o.   MRN: [id number]  HPI Here for vone week of itching and burning in the same area where she had shingles in March of this year. This is located in the left lower back. No rash has come up. She thinks this was brought on by stress, because she has been dealing with a financial audit.    Review of Systems  Constitutional: Negative.   Respiratory: Negative.   Cardiovascular: Negative.        Objective:   Physical Exam Constitutional:      Appearance: Normal appearance.  Cardiovascular:     Rate and Rhythm: Normal rate and regular rhythm.     Pulses: Normal pulses.     Heart sounds: Normal heart sounds.  Pulmonary:     Effort: Pulmonary effort is normal.     Breath sounds: Normal breath sounds.  Skin:    Comments: The skin on the lower back is clear but this area is very sensitive to the touch   Neurological:     Mental Status: She is alert.           Assessment & Plan:  Mild shingles reactivation. Treat with 10 days of Valtrex. Alysia Penna, MD

## 2020-08-10 ENCOUNTER — Encounter: Payer: Self-pay | Admitting: Family Medicine

## 2020-08-10 DIAGNOSIS — M799 Soft tissue disorder, unspecified: Secondary | ICD-10-CM | POA: Diagnosis not present

## 2020-08-10 DIAGNOSIS — Z794 Long term (current) use of insulin: Secondary | ICD-10-CM | POA: Diagnosis not present

## 2020-08-10 DIAGNOSIS — M6281 Muscle weakness (generalized): Secondary | ICD-10-CM | POA: Diagnosis not present

## 2020-08-10 DIAGNOSIS — M25511 Pain in right shoulder: Secondary | ICD-10-CM | POA: Diagnosis not present

## 2020-08-10 DIAGNOSIS — E1165 Type 2 diabetes mellitus with hyperglycemia: Secondary | ICD-10-CM | POA: Diagnosis not present

## 2020-08-10 NOTE — Telephone Encounter (Signed)
As long as the rash is covered with clothing, she can work

## 2020-08-12 DIAGNOSIS — M25511 Pain in right shoulder: Secondary | ICD-10-CM | POA: Diagnosis not present

## 2020-08-12 DIAGNOSIS — M6281 Muscle weakness (generalized): Secondary | ICD-10-CM | POA: Diagnosis not present

## 2020-08-12 DIAGNOSIS — M799 Soft tissue disorder, unspecified: Secondary | ICD-10-CM | POA: Diagnosis not present

## 2020-08-16 ENCOUNTER — Ambulatory Visit (INDEPENDENT_AMBULATORY_CARE_PROVIDER_SITE_OTHER): Payer: BC Managed Care – PPO | Admitting: Psychology

## 2020-08-16 DIAGNOSIS — F4481 Dissociative identity disorder: Secondary | ICD-10-CM | POA: Diagnosis not present

## 2020-08-16 DIAGNOSIS — F4312 Post-traumatic stress disorder, chronic: Secondary | ICD-10-CM

## 2020-08-18 DIAGNOSIS — Z794 Long term (current) use of insulin: Secondary | ICD-10-CM | POA: Diagnosis not present

## 2020-08-18 DIAGNOSIS — E1165 Type 2 diabetes mellitus with hyperglycemia: Secondary | ICD-10-CM | POA: Diagnosis not present

## 2020-08-19 DIAGNOSIS — M6281 Muscle weakness (generalized): Secondary | ICD-10-CM | POA: Diagnosis not present

## 2020-08-19 DIAGNOSIS — M25511 Pain in right shoulder: Secondary | ICD-10-CM | POA: Diagnosis not present

## 2020-08-19 DIAGNOSIS — M799 Soft tissue disorder, unspecified: Secondary | ICD-10-CM | POA: Diagnosis not present

## 2020-08-20 DIAGNOSIS — Z794 Long term (current) use of insulin: Secondary | ICD-10-CM | POA: Diagnosis not present

## 2020-08-20 DIAGNOSIS — E1165 Type 2 diabetes mellitus with hyperglycemia: Secondary | ICD-10-CM | POA: Diagnosis not present

## 2020-08-24 ENCOUNTER — Other Ambulatory Visit: Payer: Self-pay

## 2020-08-24 MED ORDER — GABAPENTIN 300 MG PO CAPS: 300.0000 mg | ORAL_CAPSULE | Freq: Three times a day (TID) | ORAL | 2 refills | Status: DC

## 2020-08-24 NOTE — Progress Notes (Signed)
Pt called requesting refill

## 2020-08-30 ENCOUNTER — Ambulatory Visit (INDEPENDENT_AMBULATORY_CARE_PROVIDER_SITE_OTHER): Payer: BC Managed Care – PPO | Admitting: Psychology

## 2020-08-30 DIAGNOSIS — F4481 Dissociative identity disorder: Secondary | ICD-10-CM | POA: Diagnosis not present

## 2020-08-30 DIAGNOSIS — F4312 Post-traumatic stress disorder, chronic: Secondary | ICD-10-CM | POA: Diagnosis not present

## 2020-08-30 DIAGNOSIS — Z20822 Contact with and (suspected) exposure to covid-19: Secondary | ICD-10-CM | POA: Diagnosis not present

## 2020-09-01 DIAGNOSIS — J40 Bronchitis, not specified as acute or chronic: Secondary | ICD-10-CM | POA: Diagnosis not present

## 2020-09-01 DIAGNOSIS — R509 Fever, unspecified: Secondary | ICD-10-CM | POA: Diagnosis not present

## 2020-09-08 DIAGNOSIS — R059 Cough, unspecified: Secondary | ICD-10-CM | POA: Diagnosis not present

## 2020-09-08 DIAGNOSIS — J40 Bronchitis, not specified as acute or chronic: Secondary | ICD-10-CM | POA: Diagnosis not present

## 2020-09-13 ENCOUNTER — Ambulatory Visit (INDEPENDENT_AMBULATORY_CARE_PROVIDER_SITE_OTHER): Payer: BC Managed Care – PPO | Admitting: Psychology

## 2020-09-13 DIAGNOSIS — F4481 Dissociative identity disorder: Secondary | ICD-10-CM | POA: Diagnosis not present

## 2020-09-14 DIAGNOSIS — J018 Other acute sinusitis: Secondary | ICD-10-CM | POA: Diagnosis not present

## 2020-09-24 DIAGNOSIS — Z794 Long term (current) use of insulin: Secondary | ICD-10-CM | POA: Diagnosis not present

## 2020-09-24 DIAGNOSIS — E1165 Type 2 diabetes mellitus with hyperglycemia: Secondary | ICD-10-CM | POA: Diagnosis not present

## 2020-09-24 DIAGNOSIS — I63312 Cerebral infarction due to thrombosis of left middle cerebral artery: Secondary | ICD-10-CM | POA: Diagnosis not present

## 2020-09-24 DIAGNOSIS — Z95818 Presence of other cardiac implants and grafts: Secondary | ICD-10-CM | POA: Diagnosis not present

## 2020-09-27 ENCOUNTER — Ambulatory Visit (INDEPENDENT_AMBULATORY_CARE_PROVIDER_SITE_OTHER): Payer: BC Managed Care – PPO | Admitting: Psychology

## 2020-09-27 DIAGNOSIS — F4481 Dissociative identity disorder: Secondary | ICD-10-CM

## 2020-09-27 DIAGNOSIS — J45909 Unspecified asthma, uncomplicated: Secondary | ICD-10-CM | POA: Diagnosis not present

## 2020-09-27 DIAGNOSIS — R059 Cough, unspecified: Secondary | ICD-10-CM | POA: Diagnosis not present

## 2020-09-27 DIAGNOSIS — F17210 Nicotine dependence, cigarettes, uncomplicated: Secondary | ICD-10-CM | POA: Diagnosis not present

## 2020-09-27 DIAGNOSIS — R0602 Shortness of breath: Secondary | ICD-10-CM | POA: Diagnosis not present

## 2020-10-02 ENCOUNTER — Other Ambulatory Visit: Payer: Self-pay | Admitting: Family Medicine

## 2020-10-06 ENCOUNTER — Encounter: Payer: Self-pay | Admitting: Family Medicine

## 2020-10-06 ENCOUNTER — Ambulatory Visit: Payer: BC Managed Care – PPO | Admitting: Family Medicine

## 2020-10-06 ENCOUNTER — Other Ambulatory Visit: Payer: Self-pay

## 2020-10-06 VITALS — BP 135/64 | Ht 62.0 in | Wt 240.0 lb

## 2020-10-06 DIAGNOSIS — M170 Bilateral primary osteoarthritis of knee: Secondary | ICD-10-CM | POA: Diagnosis not present

## 2020-10-06 NOTE — Progress Notes (Signed)
PCP: Laurey Morale, MD  Subjective:   HPI: Patient is a 44 y.o. female here for bilateral knee injections.  7/12: Patient returns today to start synvisc injections for severe medial arthritis bilateral knees. Pain medial bilaterally. No new injuries. Was released from hospital with diagnosis of complicated migraine after CT, MRI were reassuring.  7/21: Patient reports overall she's doing well. Returns for second synvisc injections. No rash.  7/28: Patient reports her right knee is feeling some better the left knee may be feels a little bit worse. She returns for her third Synvisc injections. No rash.  12/8: Patient reports knees started becoming more stiff and painful over thanksgiving. Was on her feet a lot and working. Returns for fourth visco injection.  Past Medical History:  Diagnosis Date  . Anxiety   . Arthritis   . Asthma   . Bowel obstruction ([place])   . Cardiac arrhythmia   . Depression    sees Dr. Matilde Haymaker in [place]   . Diabetes mellitus    sees Dr. Larna Daughters [place]   . GERD (gastroesophageal reflux disease)   . Hyperlipidemia   . Hypertension   . Hypothyroidism    sees Dr. Elyse Hsu  . Insomnia   . Migraine syndrome    se[facility]. Teod[facility] at [facility]   . Morbid obesity [facility])   . 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 Neck pain   . Neuropathy associated with endocrine disorder ([place])   . Sleep apnea with use of continuous positive airway pressure (CPAP)    Sleep apnea is resolved due to weight loss. 08/2017  . 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 Stroke [facility]) 04/2016   sees Dr. Teodora Medici at [facility]     Current Outpatient Medications on File Prior to Visit  Medication Sig Dispense Refill  . albuterol (PROAIR HFA) 108 (90 Base) MCG/ACT inhaler Inhale 2 puffs into the lungs every 4 (four) hours as needed for wheezing. 8.5 g 11  . ARIPiprazole (ABILIFY) 30 MG tablet Take 30 mg by mouth daily.    . baclofen (LIORESAL) 10 MG tablet     .  budesonide-formoterol (SYMBICORT) 160-4.5 MCG/ACT inhaler Inhale 2 puffs into the lungs 2 (two) times daily. 1 Inhaler 11  . buPROPion (WELLBUTRIN XL) 150 MG 24 hr tablet     . clopidogrel (PLAVIX) 75 MG tablet TAKE ONE TABLET BY MOUTH DAILY 90 tablet 0  . diphenoxylate-atropine (LOMOTIL) 2.5-0.025 MG tablet Take 1 tablet by mouth every 6 (six) hours as needed for diarrhea or loose stools. 30 tablet 0  . gabapentin (NEURONTIN) 300 MG capsule Take 1 capsule (300 mg total) by mouth 3 (three) times daily. 90 capsule 2  . HYDROcodone-acetaminophen (NORCO) 5-325 MG tablet Take 1 tablet by mouth every 6 (six) hours as needed. 20 tablet 0  . hydrOXYzine (ATARAX/VISTARIL) 50 MG tablet Take 50 mg by mouth. Take 1 tablet every night and may take 3 other times as needed    . hydrOXYzine (VISTARIL) 50 MG capsule Take 50 mg by mouth 3 (three) times daily.    . hyoscyamine (LEVSIN SL) 0.125 MG SL tablet Place 1 tablet (0.125 mg total) under the tongue every 6 (six) hours as needed. 30 tablet 1  . insulin aspart (NOVOLOG) 100 UNIT/ML injection Use in the insulin pump as directed 10 mL 0  . levothyroxine (SYNTHROID, LEVOTHROID) 125 MCG tablet Take 1 tablet (125 mcg total) by mouth daily before breakfast. 90 tablet 3  .  metoprolol succinate (TOPROL-XL) 25 MG 24 hr tablet Take 0.5 tablets (12.5 mg total) by mouth daily. 30 tablet 0  . Multiple Vitamin (MULTIVITAMIN) tablet Take 1 tablet by mouth daily.    . norethindrone (MICRONOR,CAMILA,ERRIN) 0.35 MG tablet     . NYAMYC powder APPLY TO AFFECTED AREAS FOUR TIMES A DAY 60 g 0  . pantoprazole (PROTONIX) 40 MG tablet Take 1 tablet (40 mg total) by mouth 2 (two) times daily. 60 tablet 11  . prazosin (MINIPRESS) 5 MG capsule Take 10 mg by mouth at bedtime.    . promethazine (PHENERGAN) 25 MG tablet Take 1 tablet (25 mg total) by mouth every 4 (four) hours as needed for nausea or vomiting. 60 tablet 1  . rosuvastatin (CRESTOR) 20 MG tablet Take 1 tablet (20 mg total) by  mouth daily. 30 tablet 6  . topiramate (TOPAMAX) 100 MG tablet Take 100 mg by mouth 2 (two) times daily.     . traZODone (DESYREL) 150 MG tablet Take by mouth at bedtime.    . valACYclovir (VALTREX) 1000 MG tablet Take 1 tablet (1,000 mg total) by mouth 3 (three) times daily. 30 tablet 0   No current facility-administered medications on file prior to visit.    Past Surgical History:  Procedure Laterality Date  . blocked itestinal repair  59   age 80 months  . BREAST BIOPSY Left 2017  . CHOLECYSTECTOMY  2011  . INDUCED ABORTION  1996   forced abortion  . INGUINAL HERNIA REPAIR Left 1980   age 16  . LAPAROSCOPIC ENDOMETRIOSIS FULGURATION  1998  . WISDOM TOOTH EXTRACTION  2007    Allergies  Allergen Reactions  . Macrobid [Nitrofurantoin] Hives  . Metformin And Related Other (See Comments)    Abdominal cramping   . Byetta 10 Mcg Pen [Exenatide] Hives  . Clindamycin/Lincomycin Nausea And Vomiting  . Geodon [Ziprasidone Hcl]   . Lipitor [Atorvastatin]     Per patient this causes cramps  . Nsaids     Upset stomach   . Penicillins Other (See Comments)    Has patient had a PCN reaction causing immediate rash, facial/tongue/throat swelling, SOB or lightheadedness with hypotension: No Has patient had a PCN reaction causing severe rash involving mucus membranes or skin necrosis: No Has patient had a PCN reaction that required hospitalization No Has patient had a PCN reaction occurring within the last 10 years: No If all of the above answers are "NO", then may proceed with Cephalosporin use.   Danelle Berry [Dulaglutide]     Abdominal pain   . Avocado Diarrhea and Other (See Comments)    Severe cramping, sweats, and diarrhea in upper GI/stomach  . Tramadol Hcl Itching and Rash    Social History   Socioeconomic History  . Marital status: Significant Other    Spouse name: Not on file  . Number of children: 5  . Years of education: Not on file  . Highest education level: Not on  file  Occupational History  . Occupation: Nurse, learning disability  Tobacco Use  . Smoking status: Current Every Day Smoker    Packs/day: 0.50    Years: 0.50    Pack years: 0.25    Types: Cigarettes    Start date: 02/24/2018  . Smokeless tobacco: Never Used  . Tobacco comment: has decreased how much - trying to quit  Vaping Use  . Vaping Use: Never used  Substance and Sexual Activity  . Alcohol use: No    Alcohol/week: 0.0  standard drinks  . Drug use: Yes    Types: Marijuana    Comment: 2/10 last marijuana use  . Sexual activity: Not on file  Other Topics Concern  . Not on file  Social History Narrative  . Not on file   Social Determinants of Health   Financial Resource Strain:   . Difficulty of Paying Living Expenses: Not on file  Food Insecurity:   . Worried About [place] fund[place] in the Last Year: Not on file  . Ran Out of Food in the Last Year: Not on file  Transportation Needs:   . Lack of Transportation (Medical): Not on file  . Lack of Transportation (Non-Medical): Not on file  Physical Activity:   . Days of Exercise per Week: Not on file  . Minutes of Exercise per Session: Not on file  Stress:   . Feeling of Stress : Not on file  Social Connections:   . Frequency of Communication with Friends and Family: Not on file  . Frequency of Social Gatherings with Friends and Family: Not on file  . Attends Religious Services: Not on file  . Active Member of Clubs or Organizations: Not on file  . Attends Archivist Meetings: Not on file  . Marital Status: Not on file  Intimate Partner Violence:   . Fear of Current or Ex-Partner: Not on file  . Emotionally Abused: Not on file  . Physically Abused: Not on file  . Sexually Abused: Not on file    Family History  Adopted: Yes  Problem Relation Age of Onset  . Heart disease Other        on both sides of family  . Diabetes Other        on both sides of family    BP 135/64   Ht 5\' 2"  (1.575 m)    Wt 240 lb (108.9 kg)   BMI 43.90 kg/m   Review of Systems: See HPI above.     Objective:  Physical Exam:  Gen: NAD, comfortable in exam room  Rest of exam not repeated today.  Assessment & Plan:  1. Bilateral knee arthritis - fourth gelsyn injections given today.  F/u prn.  After informed written consent timeout was performed, patient was seated on exam table. Right knee was prepped with alcohol swab and utilizing anteromedial approach, patient's right knee was injected intraarticularly with 69mL lidocaine followed by gelsyn. Patient tolerated the procedure well without immediate complications.  After informed written consent timeout was performed, patient was seated on exam table. Left knee was prepped with alcohol swab and utilizing anteromedial approach, patient's left knee was injected intraarticularly with 12mL lidocaine followed by gelsyn. Patient tolerated the procedure well without immediate complications.

## 2020-10-11 ENCOUNTER — Ambulatory Visit (INDEPENDENT_AMBULATORY_CARE_PROVIDER_SITE_OTHER): Payer: BC Managed Care – PPO | Admitting: Psychology

## 2020-10-11 DIAGNOSIS — F4481 Dissociative identity disorder: Secondary | ICD-10-CM | POA: Diagnosis not present

## 2020-10-14 DIAGNOSIS — E1165 Type 2 diabetes mellitus with hyperglycemia: Secondary | ICD-10-CM | POA: Diagnosis not present

## 2020-10-14 DIAGNOSIS — Z794 Long term (current) use of insulin: Secondary | ICD-10-CM | POA: Diagnosis not present

## 2020-10-22 ENCOUNTER — Other Ambulatory Visit: Payer: Self-pay | Admitting: Family Medicine

## 2020-10-25 ENCOUNTER — Ambulatory Visit: Payer: BC Managed Care – PPO | Admitting: Psychology

## 2020-10-28 ENCOUNTER — Ambulatory Visit: Payer: Self-pay

## 2020-10-28 ENCOUNTER — Ambulatory Visit (INDEPENDENT_AMBULATORY_CARE_PROVIDER_SITE_OTHER): Payer: BC Managed Care – PPO | Admitting: Family Medicine

## 2020-10-28 ENCOUNTER — Other Ambulatory Visit: Payer: Self-pay

## 2020-10-28 VITALS — BP 136/85 | Ht 61.0 in | Wt 245.0 lb

## 2020-10-28 DIAGNOSIS — M170 Bilateral primary osteoarthritis of knee: Secondary | ICD-10-CM

## 2020-10-28 NOTE — Progress Notes (Signed)
Miranda [name] - 44 y.o. female MRN [id number]  Date of birth: 07/17/1976  SUBJECTIVE:  Including CC & ROS.  No chief complaint on file.   Miranda [name] is a 44 y.o. female that is presenting with acute on chronic bilateral knee pain.  She is having the pain over the medial aspect.  Has completed the gel injections.  Still having pain despite that.  Denies any injury or inciting event..  Independent review of the right and left knee x-rays from 04/09/2019 show significant degenerative changes in the medial compartment.   Review of Systems See HPI   HISTORY: Past Medical, Surgical, Social, and Family History Reviewed & Updated per EMR.   Pertinent Historical Findings include:  Past Medical History:  Diagnosis Date  . Anxiety   . Arthritis   . Asthma   . Bowel obstruction (HCC)   . Cardiac arrhythmia   . Depression    [place]   . Diabetes mellitus    sees Dr. Carmon SailsMadhavi Averneni   . GERD (gastroesophageal reflux disease)   . Hyperlipidemia   . Hypertension   . Hypothyroidism    sees Dr. Leslie DalesAltheimer  . Insomnia   . Migraine syndrome    [facility]   . Morbid obesity [facility](HCC)   . Multiple personality (HCC)   . Neck pain   . Neuropathy associated with endocrine disorder (HCC)   . Sleep apnea with use of continuous positive airway pressure (CPAP)    Sleep apnea is resolved due to weight loss. 08/2017  . 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 Stroke [facility](HCC) 04/2016   [facility]     Past Surgical History:  Procedure Laterality Date  . blocked itestinal repair  721975   age 616 months  . BREAST BIOPSY Left 2017  . CHOLECYSTECTOMY  2011  . INDUCED ABORTION  1996   forced abortion  . INGUINAL HERNIA REPAIR Left 1980   age 13  . LAPAROSCOPIC ENDOMETRIOSIS FULGURATION  1998  . WISDOM TOOTH EXTRACTION  2007    Family History  Adopted: Yes  Problem Relation Age of Onset  . Heart disease Other        on  both sides of family  . Diabetes Other        on both sides of family    Social History   Socioeconomic History  . Marital status: Significant Other    Spouse name: Not on file  . Number of children: 5  . Years of education: Not on file  . Highest education level: Not on file  Occupational History  . 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 Smoking status: Current Every Day Smoker    Packs/day: 0.50    Years: 0.50    Pack years: 0.25    Types: Cigarettes    Start date: 02/24/2018  . Smokeless tobacco: Never Used  . Tobacco comment: has decreased how much - trying to quit  Vaping Use  . Vaping Use: Never used  Substance and Sexual Activity  . Alcohol use: No    Alcohol/week: 0.0 standard drinks  . Drug use: Yes    Types: Marijuana    Comment: 2/10 last marijuana use  . Sexual activity: Not on file  Other Topics Concern  . Not on file  Social History Narrative  . Not on file   Social Determinants of Health  Financial Resource Strain: Not on file  Food Insecurity: Not on file  Transportation Needs: Not on file  Physical Activity: Not on file  Stress: Not on file  Social Connections: Not on file  Intimate Partner Violence: Not on file     PHYSICAL EXAM:  VS: BP 136/85   Ht 5\' 1"  (1.549 m)   Wt 245 lb (111.1 kg)   BMI 46.29 kg/m  Physical Exam Gen: NAD, alert, cooperative with exam, well-appearing MSK:  Right and left knee: No obvious effusion. Tenderness palpation of the medial joint space. Normal range of motion. Normal strength resistance. Neurovascular intact   Aspiration/Injection Procedure Note Miranda [name] 12/17/1975  Procedure: Injection Indications: Left knee pain  Procedure Details Consent: Risks of procedure as well as the alternatives and risks of each were explained to the (patient/caregiver).  Consent for procedure obtained. Time Out: Verified patient identification, verified procedure, site/side was marked,  verified correct patient position, special equipment/implants available, medications/allergies/relevent history reviewed, required imaging and test results available.  Performed.  The area was cleaned with iodine and alcohol swabs.    The left knee superior lateral suprapatellar pouch was injected using 3 cc of 1% lidocaine on a 22-gauge 1-1/2 inch needle.  The syringe was switched to mixture containing 1 cc's of 40 mg Kenalog and 4 cc's of 0.25% bupivacaine was injected.  Ultrasound was used. Images were obtained in long views showing the injection.     A sterile dressing was applied.  Patient did tolerate procedure well.  Aspiration/Injection Procedure Note Miranda [name] 03/08/1976  Procedure: Injection Indications: Right knee pain  Procedure Details Consent: Risks of procedure as well as the alternatives and risks of each were explained to the (patient/caregiver).  Consent for procedure obtained. Time Out: Verified patient identification, verified procedure, site/side was marked, verified correct patient position, special equipment/implants available, medications/allergies/relevent history reviewed, required imaging and test results available.  Performed.  The area was cleaned with iodine and alcohol swabs.    The right knee superior lateral suprapatellar pouch was injected using 3 cc of 1% lidocaine on a 22-gauge 1-1/2 inch needle.  The syringe was switched to mixture containing 1 cc's of 40 mg Kenalog and 4 cc's of 0.25% bupivacaine was injected.  Ultrasound was used. Images were obtained in long views showing the injection.     A sterile dressing was applied.  Patient did tolerate procedure well.   ASSESSMENT & PLAN:   Primary osteoarthritis of both knees Acute on chronic in nature.  Has tried gel injections with limited improvement.  Does have significant degenerative changes medially. -Counseled on home exercise therapy and supportive care. -Bilateral injections. -OA  reaction brace. -Could consider Zilretta injections.

## 2020-10-28 NOTE — Patient Instructions (Signed)
Good to see you Please try ice as needed  Please try the pennsaid   Please send me a message in MyChart with any questions or updates.  Please see me back in 4 weeks or as needed if better.   --Dr. Jordan Likes

## 2020-10-28 NOTE — Assessment & Plan Note (Signed)
Acute on chronic in nature.  Has tried gel injections with limited improvement.  Does have significant degenerative changes medially. -Counseled on home exercise therapy and supportive care. -Bilateral injections. -OA reaction brace. -Could consider Zilretta injections.

## 2020-11-01 ENCOUNTER — Ambulatory Visit: Payer: BC Managed Care – PPO | Admitting: Family Medicine

## 2020-11-08 ENCOUNTER — Ambulatory Visit (INDEPENDENT_AMBULATORY_CARE_PROVIDER_SITE_OTHER): Payer: BC Managed Care – PPO | Admitting: Psychology

## 2020-11-08 DIAGNOSIS — F4312 Post-traumatic stress disorder, chronic: Secondary | ICD-10-CM

## 2020-11-22 ENCOUNTER — Ambulatory Visit: Payer: BC Managed Care – PPO | Admitting: Family Medicine

## 2020-11-22 ENCOUNTER — Ambulatory Visit (INDEPENDENT_AMBULATORY_CARE_PROVIDER_SITE_OTHER): Payer: BC Managed Care – PPO | Admitting: Psychology

## 2020-11-22 ENCOUNTER — Other Ambulatory Visit: Payer: Self-pay | Admitting: Family Medicine

## 2020-11-22 DIAGNOSIS — F4481 Dissociative identity disorder: Secondary | ICD-10-CM

## 2020-11-24 ENCOUNTER — Ambulatory Visit: Payer: BC Managed Care – PPO | Admitting: Family Medicine

## 2020-11-29 ENCOUNTER — Ambulatory Visit (INDEPENDENT_AMBULATORY_CARE_PROVIDER_SITE_OTHER): Payer: BC Managed Care – PPO | Admitting: Sports Medicine

## 2020-11-29 ENCOUNTER — Encounter: Payer: Self-pay | Admitting: Sports Medicine

## 2020-11-29 ENCOUNTER — Other Ambulatory Visit: Payer: Self-pay

## 2020-11-29 VITALS — BP 146/78 | Ht 61.75 in | Wt 240.0 lb

## 2020-11-29 DIAGNOSIS — M25562 Pain in left knee: Secondary | ICD-10-CM

## 2020-11-29 DIAGNOSIS — M170 Bilateral primary osteoarthritis of knee: Secondary | ICD-10-CM | POA: Diagnosis not present

## 2020-11-29 DIAGNOSIS — G8929 Other chronic pain: Secondary | ICD-10-CM

## 2020-11-29 DIAGNOSIS — M25561 Pain in right knee: Secondary | ICD-10-CM

## 2020-11-29 DIAGNOSIS — M5416 Radiculopathy, lumbar region: Secondary | ICD-10-CM | POA: Diagnosis not present

## 2020-11-29 NOTE — Patient Instructions (Signed)
It was wonderful to see you today.  We are sorry to hear that your leg and knees have continued to bother you.  It is likely that your left leg pain may be coming from your back.  We are going to replace an order for physical therapy to restart for your back and then also add on your shoulder.  You can continue to use heat 20 minutes at a time several times a day on your back and on 2 days.  Continue your pain medication as is.  Try to avoid painful activities as possible.   If you have a sudden onset weakness, numbness/tingling, or any changes in your bowel or bladder function please be seen right away.

## 2020-11-29 NOTE — Progress Notes (Signed)
SUBJECTIVE:   CHIEF COMPLAINT / HPI: Knee/leg pain   Ms. Miranda [name] is a 45 year old female presenting for evaluation of acute on chronic bilateral knee pain and left leg pain.  Left worse than right.  She has a known history of severe medial compartment osteoarthritis bilaterally.  Recently saw Dr. Raeford Razor for this concern in 09/2020, performed bilateral knee CSI.   Today, she reports an approximate 1 month history of constant left leg pain.  Feels like it starts at her hip and will go down into her lower leg, describes as "close to sharp, like growing pains."  Rotating her back or body to the left does seem to make it worse.  She is having trouble getting comfortable to sleep, however when she is comfortable it is not waking her up at night.  No preceding injury or trauma.  She denies any overt weakness, numbness/tingling, bowel/bladder changes.  Both standing and sitting are irritating to her back and leg after several minutes.  Reports chronic low back pain that has not increased in frequency or severity recently.  She reports her bilateral knee pain continues to be at baseline and bothersome.  Recent steroid injections provided relief for about 1 day.  She is previously done 2 rounds of gel injections that did help, however recent series did not last long.  She is interested in proceeding with knee replacement in the future when she is able to get her BMI under 40, has seen Dr. Marlou Sa already in 2020/2021.  She has previously got her BMI under 40 in preparation for surgery in the past, however ended up not having this completed due to difficulty with leave from work.  She also has previously tried SGLT2 inhibitor and a GLP-1 to concurrently help with weight loss, however had recurrent yeast infections and nausea/vomiting.  PERTINENT  PMH / PSH: Severe bilateral medial compartment knee OA, history of previous CVA, hypertension, type II DM with neuropathy  OBJECTIVE:   BP (!) 146/78   Ht 5'  1.75" (1.568 m)   Wt 240 lb (108.9 kg)   BMI 44.25 kg/m   General: Alert, NAD HEENT: NCAT, MMM Lungs: No increased WOB   Lumbar spine: - Inspection: no gross deformity or asymmetry, swelling or ecchymosis - Palpation: No TTP over the spinous processes, however tender over left lumbar paraspinal musculature without any specific trigger points. - ROM: full active ROM of the lumbar spine in flexion and extension, some tenderness in her left leg/hip elicited with rotating to the left. - Strength: 5/5 strength of lower extremity in L4-S1 nerve root distributions b/l - Neuro: sensation intact in the L4-S1 nerve root distribution b/l, 2+ L4 and S1 reflexes b/l - Special testing: Equivocal straight leg raise  Left Knee: - Inspection: no gross deformity. No swelling/effusion, erythema or bruising. Skin intact.  - Palpation: TTP to medial compartment bilaterally - ROM: full active ROM with flexion and extension in knee and hip - Strength: 5/5 strength - Neuro/vasc: NV intact - Special Tests: - LIGAMENTS: Negative Lachman's, no MCL or LCL laxity   ASSESSMENT/PLAN:   Acute left leg pain:   Likely multifactorial, with probable lumbar radiculopathy 2/2 DDD and known severe medial compartment knee OA.  Reassuringly her strength appears intact without any significant functional impairment.  After discussion with patient, will proceed with trial of formal PT with continued heat/analgesics as needed, in addition to continued follow-up of below.  Severe bilateral knee OA: Possibly contributing to #1.  Minimal relief from steroid  and gel injections.  Already following with orthopedic surgery, Dr. Marlou Sa, and would like to proceed with knee replacement when she has leave available from work and able to get her BMI under 40.  She is going to talk with her endocrinologist about possibly retrying in GLP-1 agonist to aid in weight loss.  Will restart PT as above, should continue working on strengthening her  upper legs/hips.  Voltaren gel/analgesics/heat as needed in the interim.  Follow-up as needed if not improving or sooner if worsening.  Patriciaann Clan, [place]    Patient seen and evaluated with the resident. Note reviewed and edited by me. I agree with the above plan of care. Shellia Cleverly, DO

## 2020-12-06 ENCOUNTER — Ambulatory Visit (INDEPENDENT_AMBULATORY_CARE_PROVIDER_SITE_OTHER): Payer: BC Managed Care – PPO | Admitting: Psychology

## 2020-12-06 DIAGNOSIS — F4481 Dissociative identity disorder: Secondary | ICD-10-CM

## 2020-12-20 ENCOUNTER — Encounter: Payer: Self-pay | Admitting: Family Medicine

## 2020-12-20 ENCOUNTER — Ambulatory Visit (INDEPENDENT_AMBULATORY_CARE_PROVIDER_SITE_OTHER): Payer: BC Managed Care – PPO | Admitting: Psychology

## 2020-12-20 ENCOUNTER — Ambulatory Visit (INDEPENDENT_AMBULATORY_CARE_PROVIDER_SITE_OTHER): Payer: BC Managed Care – PPO | Admitting: Family Medicine

## 2020-12-20 ENCOUNTER — Other Ambulatory Visit: Payer: Self-pay

## 2020-12-20 VITALS — BP 134/78 | Ht 61.75 in | Wt 242.2 lb

## 2020-12-20 DIAGNOSIS — M170 Bilateral primary osteoarthritis of knee: Secondary | ICD-10-CM

## 2020-12-20 DIAGNOSIS — M25561 Pain in right knee: Secondary | ICD-10-CM | POA: Diagnosis not present

## 2020-12-20 DIAGNOSIS — M25562 Pain in left knee: Secondary | ICD-10-CM | POA: Diagnosis not present

## 2020-12-20 DIAGNOSIS — G8929 Other chronic pain: Secondary | ICD-10-CM | POA: Diagnosis not present

## 2020-12-20 DIAGNOSIS — F4481 Dissociative identity disorder: Secondary | ICD-10-CM | POA: Diagnosis not present

## 2020-12-20 DIAGNOSIS — F4312 Post-traumatic stress disorder, chronic: Secondary | ICD-10-CM | POA: Diagnosis not present

## 2020-12-20 MED ORDER — HYDROCODONE-ACETAMINOPHEN 5-325 MG PO TABS: 1.0000 | ORAL_TABLET | Freq: Four times a day (QID) | ORAL | 0 refills | Status: DC | PRN

## 2020-12-20 NOTE — Patient Instructions (Addendum)
Your pain is due to arthritis. These are the different medications you can take for this: Norco as needed for severe pain - no driving or working while on this. Let me know if you want to see the pain management folks. Capsaicin, aspercreme, or biofreeze topically up to four times a day may also help with pain. Some supplements that may help for arthritis: Boswellia extract, curcumin, pycnogenol The steroid and gel shots haven't been working. It's important that you continue to stay active. Straight leg raises, knee extensions 3 sets of 10 once a day (add ankle weight if these become too easy). We will add physical therapy for your knees. Heat or ice 15 minutes at a time 3-4 times a day as needed to help with pain. Water aerobics and cycling with low resistance are the best two types of exercise for arthritis though any exercise is ok as long as it doesn't worsen the pain.  We will check on the custom medial unloader braces for you. Follow up with me in 6 weeks.

## 2020-12-20 NOTE — Progress Notes (Signed)
PCP: Laurey Morale, MD  Subjective:   HPI: Patient is a 45 y.o. female here for bilateral knee pain.  7/12: Patient returns today to start synvisc injections for severe medial arthritis bilateral knees. Pain medial bilaterally. No new injuries. Was released from hospital with diagnosis of complicated migraine after CT, MRI were reassuring.  7/21: Patient reports overall she's doing well. Returns for second synvisc injections. No rash.  7/28: Patient reports her right knee is feeling some better the left knee may be feels a little bit worse. She returns for her third Synvisc injections. No rash.  12/8: Patient reports knees started becoming more stiff and painful over thanksgiving. Was on her feet a lot and working. Returns for fourth visco injection.  12/20/20: Patient reports her knees continue to give her a lot of pain, currently 7/10 level. Last steroid injections about 2 months ago did not provide much relief. She is taking gabapentin and duexis which both help. Tried medial unloader brace but this does not fit properly, interested in custom brace. Doing physical therapy for her back currently.  Past Medical History:  Diagnosis Date  . Anxiety   . Arthritis   . Asthma   . Bowel obstruction ([place])   . Cardiac arrhythmia   . Depression    sees Dr. Matilde Haymaker in [place]   . Diabetes mellitus    sees Dr. Larna Daughters Averneni   . GERD (gastroesophageal reflux disease)   . Hyperlipidemia   . Hypertension   . Hypothyroidism    sees Dr. Elyse Hsu  . Insomnia   . Migraine syndrome    se[facility]od[facility] Medici at [facility]   . Morbid obesity [facility])   . Multiple personality ([place])   . Neck pain   . Neuropathy associated with endocrine disorder ([place])   . Sleep apnea with use of continuous positive airway pressure (CPAP)    Sleep apnea is resolved due to weight loss. 08/2017  . Stroke [facility]) 03-25-16   left MCA   . Stroke [facility]) 04/2016   sees Dr. Teodora Medici at [facility]     Current Outpatient Medications on File Prior to Visit  Medication Sig Dispense Refill  . albuterol (PROAIR HFA) 108 (90 Base) MCG/ACT inhaler Inhale 2 puffs into the lungs every 4 (four) hours as needed for wheezing. 8.5 g 11  . ARIPiprazole (ABILIFY) 30 MG tablet Take 30 mg by mouth daily.    . baclofen (LIORESAL) 10 MG tablet     . budesonide-formoterol (SYMBICORT) 160-4.5 MCG/ACT inhaler Inhale 2 puffs into the lungs 2 (two) times daily. 1 Inhaler 11  . buPROPion (WELLBUTRIN XL) 150 MG 24 hr tablet     . clopidogrel (PLAVIX) 75 MG tablet TAKE ONE TABLET BY MOUTH DAILY 90 tablet 0  . diphenoxylate-atropine (LOMOTIL) 2.5-0.025 MG tablet Take 1 tablet by mouth every 6 (six) hours as needed for diarrhea or loose stools. 30 tablet 0  . gabapentin (NEURONTIN) 300 MG capsule TAKE ONE CAPSULE BY MOUTH THREE TIMES A DAY 90 capsule 2  . hydrOXYzine (ATARAX/VISTARIL) 50 MG tablet Take 50 mg by mouth. Take 1 tablet every night and may take 3 other times as needed    . hydrOXYzine (VISTARIL) 50 MG capsule Take 50 mg by mouth 3 (three) times daily.    . hyoscyamine (LEVSIN SL) 0.125 MG SL tablet Place 1 tablet (0.125 mg total) under the tongue every 6 (six) hours as needed. 30 tablet 1  . insulin aspart (NOVOLOG) 100  UNIT/ML injection Use in the insulin pump as directed 10 mL 0  . levothyroxine (SYNTHROID, LEVOTHROID) 125 MCG tablet Take 1 tablet (125 mcg total) by mouth daily before breakfast. 90 tablet 3  . metoprolol succinate (TOPROL-XL) 25 MG 24 hr tablet Take 0.5 tablets (12.5 mg total) by mouth daily. 30 tablet 0  . Multiple Vitamin (MULTIVITAMIN) tablet Take 1 tablet by mouth daily.    . norethindrone (MICRONOR,CAMILA,ERRIN) 0.35 MG tablet     . NYAMYC powder APPLY TO AFFECTED AREAS FOUR TIMES A DAY 60 g 0  . pantoprazole (PROTONIX) 40 MG tablet TAKE ONE TABLET BY MOUTH TWICE A DAY 180 tablet 2  . prazosin (MINIPRESS) 5 MG capsule Take 10 mg by mouth at bedtime.    . promethazine  (PHENERGAN) 25 MG tablet Take 1 tablet (25 mg total) by mouth every 4 (four) hours as needed for nausea or vomiting. 60 tablet 1  . rosuvastatin (CRESTOR) 20 MG tablet Take 1 tablet (20 mg total) by mouth daily. 30 tablet 6  . topiramate (TOPAMAX) 100 MG tablet Take 100 mg by mouth 2 (two) times daily.     . traZODone (DESYREL) 150 MG tablet Take by mouth at bedtime.    . valACYclovir (VALTREX) 1000 MG tablet Take 1 tablet (1,000 mg total) by mouth 3 (three) times daily. 30 tablet 0   No current facility-administered medications on file prior to visit.    Past Surgical History:  Procedure Laterality Date  . blocked itestinal repair  23   age 77 months  . BREAST BIOPSY Left 2017  . CHOLECYSTECTOMY  2011  . INDUCED ABORTION  1996   forced abortion  . INGUINAL HERNIA REPAIR Left 1980   age 54  . LAPAROSCOPIC ENDOMETRIOSIS FULGURATION  1998  . WISDOM TOOTH EXTRACTION  2007    Allergies  Allergen Reactions  . Macrobid [Nitrofurantoin] Hives  . Metformin And Related Other (See Comments)    Abdominal cramping   . Byetta 10 Mcg Pen [Exenatide] Hives  . Clindamycin/Lincomycin Nausea And Vomiting  . Geodon [Ziprasidone Hcl]   . Lipitor [Atorvastatin]     Per patient this causes cramps  . Nsaids     Upset stomach   . Penicillins Other (See Comments)    Has patient had a PCN reaction causing immediate rash, facial/tongue/throat swelling, SOB or lightheadedness with hypotension: No Has patient had a PCN reaction causing severe rash involving mucus membranes or skin necrosis: No Has patient had a PCN reaction that required hospitalization No Has patient had a PCN reaction occurring within the last 10 years: No If all of the above answers are "NO", then may proceed with Cephalosporin use.   Danelle Berry [Dulaglutide]     Abdominal pain   . Avocado Diarrhea and Other (See Comments)    Severe cramping, sweats, and diarrhea in upper GI/stomach  . Tramadol Hcl Itching and Rash    Social  History   Socioeconomic History  . Marital status: Significant Other    Spouse name: Not on file  . Number of children: 5  . Years of education: Not on file  . Highest education level: Not on file  Occupational History  . Occupation: Nurse, learning disability  Tobacco Use  . Smoking status: Current Every Day Smoker    Packs/day: 0.50    Years: 0.50    Pack years: 0.25    Types: Cigarettes    Start date: 02/24/2018  . Smokeless tobacco: Never Used  . Tobacco comment:  has decreased how much - trying to quit  Vaping Use  . Vaping Use: Never used  Substance and Sexual Activity  . Alcohol use: No    Alcohol/week: 0.0 standard drinks  . Drug use: Yes    Types: Marijuana    Comment: 2/10 last marijuana use  . Sexual activity: Not on file  Other Topics Concern  . Not on file  Social History Narrative  . Not on file   Social Determinants of Health   Financial Resource Strain: Not on file  Food Insecurity: Not on file  Transportation Needs: Not on file  Physical Activity: Not on file  Stress: Not on file  Social Connections: Not on file  Intimate Partner Violence: Not on file    Family History  Adopted: Yes  Problem Relation Age of Onset  . Heart disease Other        on both sides of family  . Diabetes Other        on both sides of family    BP 134/78   Ht 5' 1.75" (1.568 m)   Wt 242 lb 3.2 oz (109.9 kg)   BMI 44.66 kg/m   Review of Systems: See HPI above.     Objective:  Physical Exam:  Gen: NAD, comfortable in exam room  Right knee: No gross deformity, ecchymoses, obvious effusion. TTP medial > lateral joint lines. FROM with normal strength. Negative ant/post drawers. Negative valgus/varus testing. Negative lachman. Negative mcmurrays, apleys. NV intact distally.  Left knee: No gross deformity, ecchymoses, obvious effusion. TTP medial > lateral joint lines. FROM with normal strength. Negative ant/post drawers. Negative valgus/varus testing.  Negative lachman. Negative mcmurrays, apleys. NV intact distally.  Assessment & Plan:  1. Bilateral knee arthritis - unfortunately not much benefit with both visco series and steroid injections.  She is seeing dietitian to work on weight loss in preparation for knee replacement surgery.  Will see if she can get custom medial unloader braces as her arthritis pain is primarily medial where imaging shows her more advanced arthritis and leading to gait abnormalities - braces will help with this.  Add PT.  Heat or ice, topical medications.  Short course of norco given for severe pain- database reviewed.  F/u in 6 weeks.

## 2021-01-03 ENCOUNTER — Ambulatory Visit (INDEPENDENT_AMBULATORY_CARE_PROVIDER_SITE_OTHER): Payer: BC Managed Care – PPO | Admitting: Psychology

## 2021-01-03 DIAGNOSIS — F4481 Dissociative identity disorder: Secondary | ICD-10-CM

## 2021-01-17 ENCOUNTER — Ambulatory Visit (INDEPENDENT_AMBULATORY_CARE_PROVIDER_SITE_OTHER): Payer: BC Managed Care – PPO | Admitting: Psychology

## 2021-01-17 DIAGNOSIS — F4481 Dissociative identity disorder: Secondary | ICD-10-CM | POA: Diagnosis not present

## 2021-01-25 ENCOUNTER — Other Ambulatory Visit: Payer: Self-pay | Admitting: Family Medicine

## 2021-01-25 MED ORDER — NYSTATIN 100000 UNIT/GM EX POWD: Freq: Four times a day (QID) | CUTANEOUS | 0 refills | Status: AC

## 2021-01-31 ENCOUNTER — Ambulatory Visit: Payer: BC Managed Care – PPO | Admitting: Psychology

## 2021-02-02 ENCOUNTER — Ambulatory Visit (INDEPENDENT_AMBULATORY_CARE_PROVIDER_SITE_OTHER): Payer: BC Managed Care – PPO | Admitting: Family Medicine

## 2021-02-02 ENCOUNTER — Encounter: Payer: Self-pay | Admitting: Family Medicine

## 2021-02-02 ENCOUNTER — Other Ambulatory Visit: Payer: Self-pay

## 2021-02-02 VITALS — BP 110/70 | Ht 62.0 in | Wt 230.0 lb

## 2021-02-02 DIAGNOSIS — M25562 Pain in left knee: Secondary | ICD-10-CM

## 2021-02-02 DIAGNOSIS — M25561 Pain in right knee: Secondary | ICD-10-CM

## 2021-02-02 DIAGNOSIS — G8929 Other chronic pain: Secondary | ICD-10-CM | POA: Diagnosis not present

## 2021-02-02 NOTE — Progress Notes (Signed)
PCP: Laurey Morale, MD  Subjective:   HPI: Patient is a 45 y.o. female here for bilateral knee pain.  12/20/20: Patient reports her knees continue to give her a lot of pain, currently 7/10 level. Last steroid injections about 2 months ago did not provide much relief. She is taking gabapentin and duexis which both help. Tried medial unloader brace but this does not fit properly, interested in custom brace. Doing physical therapy for her back currently.  4/6: Patient reports her knees are doing well. Has been doing physical therapy, home exercises up until a week ago. Started to get heart racing episodes associated with shortness of breath so seen by cardiology. Going to get a holter monitor and additional workup for this so physical therapy on hold. No other complaints.  Past Medical History:  Diagnosis Date  . Anxiety   . Arthritis   . Asthma   . Bowel obstruction ([place])   . Cardiac arrhythmia   . Depression    sees Dr. Matilde Haymaker in [place]   . Diabetes mellitus    sees Dr. Larna Daughters Averneni   . GERD (gastroesophageal reflux disease)   . Hyperlipidemia   . Hypertension   . Hypothyroidism    sees Dr. Elyse Hsu  . Insomnia   . Migraine syndrome    sees Dr. Te[facility]i at [facility]   . Morbid obesity [facility])   . 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 Neck pain   . Neuropathy associated with endocrine disorder ([place])   . Sleep apnea with use of continuous positive airway pressure (CPAP)    Sleep apnea is resolved due to weight loss. 08/2017  . 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 Stroke [facility]) 04/2016   sees Dr. Teodora Medici at [facility]     Current Outpatient Medications on File Prior to Visit  Medication Sig Dispense Refill  . albuterol (PROAIR HFA) 108 (90 Base) MCG/ACT inhaler Inhale 2 puffs into the lungs every 4 (four) hours as needed for wheezing. 8.5 g 11  . ARIPiprazole (ABILIFY) 30 MG tablet Take 30 mg by mouth daily.    . baclofen (LIORESAL) 10 MG tablet     .  budesonide-formoterol (SYMBICORT) 160-4.5 MCG/ACT inhaler Inhale 2 puffs into the lungs 2 (two) times daily. 1 Inhaler 11  . buPROPion (WELLBUTRIN XL) 150 MG 24 hr tablet     . clopidogrel (PLAVIX) 75 MG tablet TAKE ONE TABLET BY MOUTH DAILY 90 tablet 0  . diphenoxylate-atropine (LOMOTIL) 2.5-0.025 MG tablet Take 1 tablet by mouth every 6 (six) hours as needed for diarrhea or loose stools. 30 tablet 0  . gabapentin (NEURONTIN) 300 MG capsule TAKE ONE CAPSULE BY MOUTH THREE TIMES A DAY 90 capsule 2  . HYDROcodone-acetaminophen (NORCO) 5-325 MG tablet Take 1 tablet by mouth every 6 (six) hours as needed for moderate pain. 30 tablet 0  . hydrOXYzine (ATARAX/VISTARIL) 50 MG tablet Take 50 mg by mouth. Take 1 tablet every night and may take 3 other times as needed    . hydrOXYzine (VISTARIL) 50 MG capsule Take 50 mg by mouth 3 (three) times daily.    . hyoscyamine (LEVSIN SL) 0.125 MG SL tablet Place 1 tablet (0.125 mg total) under the tongue every 6 (six) hours as needed. 30 tablet 1  . insulin aspart (NOVOLOG) 100 UNIT/ML injection Use in the insulin pump as directed 10 mL 0  . levothyroxine (SYNTHROID, LEVOTHROID) 125 MCG tablet Take 1 tablet (125 mcg total) by  mouth daily before breakfast. 90 tablet 3  . metoprolol succinate (TOPROL-XL) 25 MG 24 hr tablet Take 0.5 tablets (12.5 mg total) by mouth daily. 30 tablet 0  . montelukast (SINGULAIR) 10 MG tablet Take 1 tablet by mouth daily.    . Multiple Vitamin (MULTIVITAMIN) tablet Take 1 tablet by mouth daily.    . norethindrone (MICRONOR,CAMILA,ERRIN) 0.35 MG tablet     . nystatin [facility]) powder Apply topically QID. 60 g 0  . OZEMPIC, 0.25 OR 0.5 MG/DOSE, 2 MG/1.5ML SOPN Inject into the skin.    . pantoprazole (PROTONIX) 40 MG tablet TAKE ONE TABLET BY MOUTH TWICE A DAY 180 tablet 2  . prazosin (MINIPRESS) 5 MG capsule Take 10 mg by mouth at bedtime.    . promethazine (PHENERGAN) 25 MG tablet Take 1 tablet (25 mg total) by mouth every 4 (four) hours  as needed for nausea or vomiting. 60 tablet 1  . rosuvastatin (CRESTOR) 20 MG tablet Take 1 tablet (20 mg total) by mouth daily. 30 tablet 6  . topiramate (TOPAMAX) 100 MG tablet Take 100 mg by mouth 2 (two) times daily.     . traZODone (DESYREL) 150 MG tablet Take by mouth at bedtime.    . valACYclovir (VALTREX) 1000 MG tablet Take 1 tablet (1,000 mg total) by mouth 3 (three) times daily. 30 tablet 0   No current facility-administered medications on file prior to visit.    Past Surgical History:  Procedure Laterality Date  . blocked itestinal repair  23   age 104 months  . BREAST BIOPSY Left 2017  . CHOLECYSTECTOMY  2011  . INDUCED ABORTION  1996   forced abortion  . INGUINAL HERNIA REPAIR Left 1980   age 1  . LAPAROSCOPIC ENDOMETRIOSIS FULGURATION  1998  . WISDOM TOOTH EXTRACTION  2007    Allergies  Allergen Reactions  . Macrobid [Nitrofurantoin] Hives  . Metformin And Related Other (See Comments)    Abdominal cramping   . Byetta 10 Mcg Pen [Exenatide] Hives  . Clindamycin/Lincomycin Nausea And Vomiting  . Geodon [Ziprasidone Hcl]   . Lipitor [Atorvastatin]     Per patient this causes cramps  . Nsaids     Upset stomach   . Penicillins Other (See Comments)    Has patient had a PCN reaction causing immediate rash, facial/tongue/throat swelling, SOB or lightheadedness with hypotension: No Has patient had a PCN reaction causing severe rash involving mucus membranes or skin necrosis: No Has patient had a PCN reaction that required hospitalization No Has patient had a PCN reaction occurring within the last 10 years: No If all of the above answers are "NO", then may proceed with Cephalosporin use.   Danelle Berry [Dulaglutide]     Abdominal pain   . Avocado Diarrhea and Other (See Comments)    Severe cramping, sweats, and diarrhea in upper GI/stomach  . Tramadol Hcl Itching and Rash    Social History   Socioeconomic History  . Marital status: Significant Other    Spouse  name: Not on file  . Number of children: 5  . Years of education: Not on file  . Highest education level: Not on file  Occupational History  . Occupation: Nurse, learning disability  Tobacco Use  . Smoking status: Current Every Day Smoker    Packs/day: 0.50    Years: 0.50    Pack years: 0.25    Types: Cigarettes    Start date: 02/24/2018  . Smokeless tobacco: Never Used  . Tobacco comment:  has decreased how much - trying to quit  Vaping Use  . Vaping Use: Never used  Substance and Sexual Activity  . Alcohol use: No    Alcohol/week: 0.0 standard drinks  . Drug use: Yes    Types: Marijuana    Comment: 2/10 last marijuana use  . Sexual activity: Not on file  Other Topics Concern  . Not on file  Social History Narrative  . Not on file   Social Determinants of Health   Financial Resource Strain: Not on file  Food Insecurity: Not on file  Transportation Needs: Not on file  Physical Activity: Not on file  Stress: Not on file  Social Connections: Not on file  Intimate Partner Violence: Not on file    Family History  Adopted: Yes  Problem Relation Age of Onset  . Heart disease Other        on both sides of family  . Diabetes Other        on both sides of family    BP 110/70   Ht 5\' 2"  (1.575 m)   Wt 230 lb (104.3 kg)   BMI 42.07 kg/m   Review of Systems: See HPI above.     Objective:  Physical Exam:  Gen: NAD, comfortable in exam room  Right knee: No gross deformity, ecchymoses, swelling. No TTP. FROM with normal strength. Negative ant/post drawers. Negative valgus/varus testing. Negative lachman. Negative mcmurrays, apleys. NV intact distally.  Left knee: No gross deformity, ecchymoses, swelling. No TTP. FROM with normal strength. Negative ant/post drawers. Negative valgus/varus testing. Negative lachman. Negative mcmurrays, apleys. NV intact distally.  Assessment & Plan:  1. Bilateral knee arthritis - overall doing well with physical  therapy and home exercises, dietitian and weight loss.  Not much benefit with visco and steroid injections.  PT on hold for now while she completes cardiac workup.

## 2021-02-14 ENCOUNTER — Ambulatory Visit: Payer: BC Managed Care – PPO | Admitting: Psychology

## 2021-02-16 IMAGING — MR MR HIP*L* W/O CM
5 series · 40 of 40 positions shown · non-contrast
Comparison: None.

CLINICAL DATA: Left hip pain with limited range of motion.

EXAM:
MR OF THE LEFT HIP WITHOUT CONTRAST
TECHNIQUE: Multiplanar, multisequence MR imaging was performed. No intravenous
contrast was administered.

[Series 3: T1 · coronal · 4.0mm · 1.19mm/px · 9 of 24 slices shown]
[im 1/24]
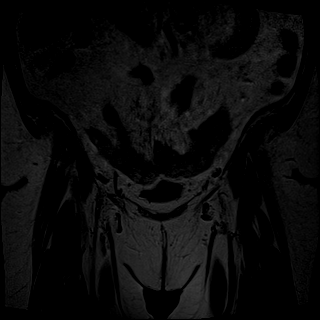
[im 3/24]
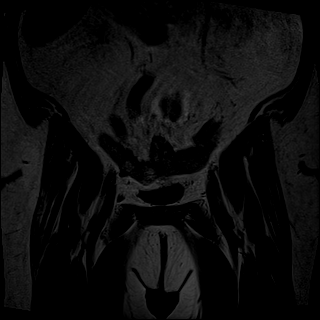
[im 6/24]
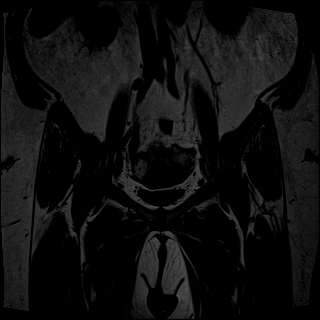
[im 9/24]
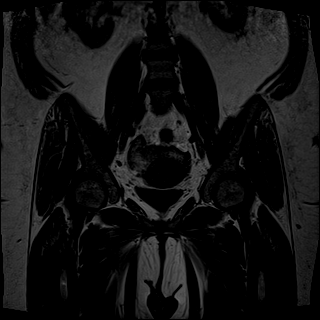
[im 12/24]
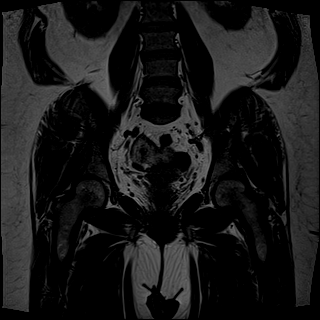
[im 15/24]
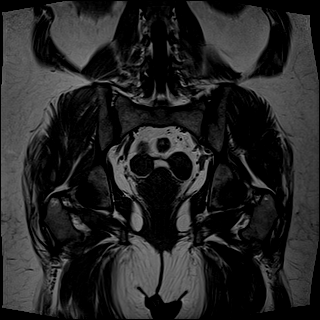
[im 18/24]
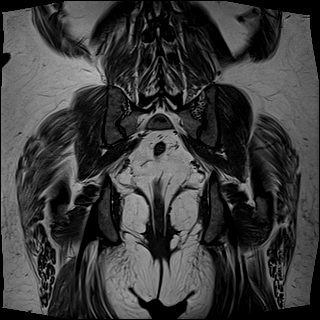
[im 21/24]
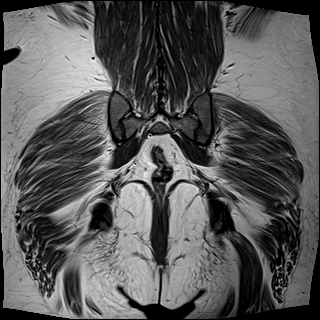
[im 24/24]
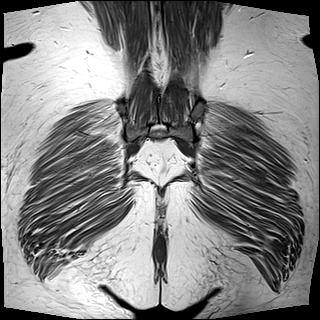

[Series 4: T2 fat-sat · coronal · 4.0mm · 1.19mm/px · 8 of 24 slices shown (1 of 2)]
[im 1/24]
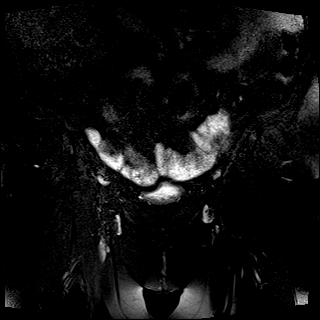
[im 4/24]
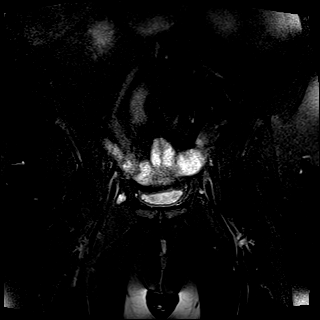
[im 7/24]
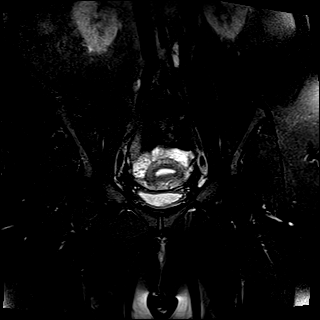
[im 10/24]
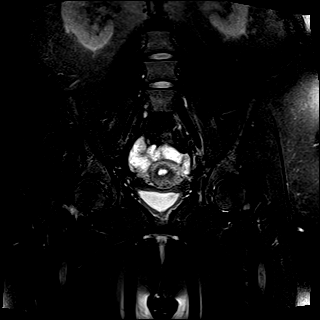
[im 14/24]
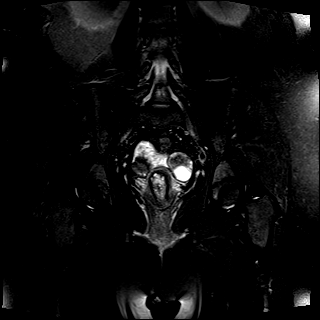
[im 17/24]
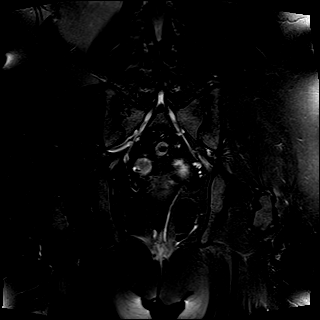
[im 20/24]
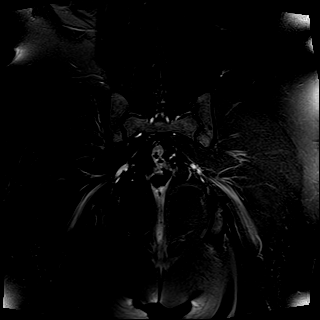
[im 24/24]
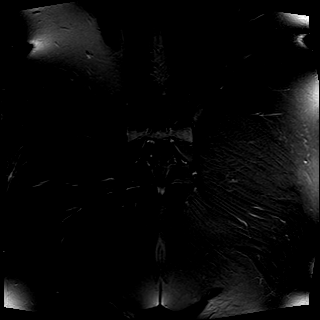

[Series 5: T2 fat-sat · axial · 4.0mm · 0.35mm/px · z∈[+5,+120]mm · 8 of 24 slices shown (2 of 2)]
[im 1/24]
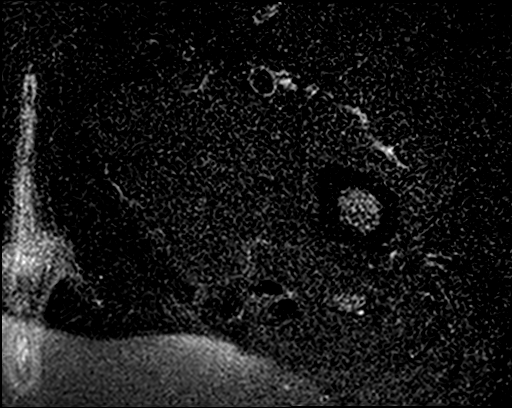
[im 4/24]
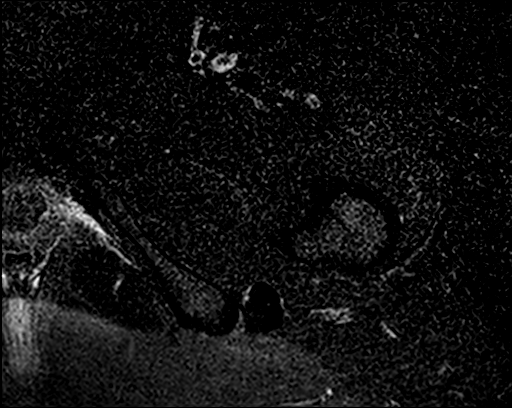
[im 7/24]
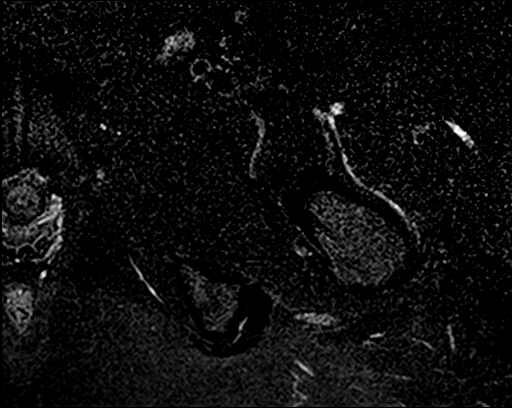
[im 10/24]
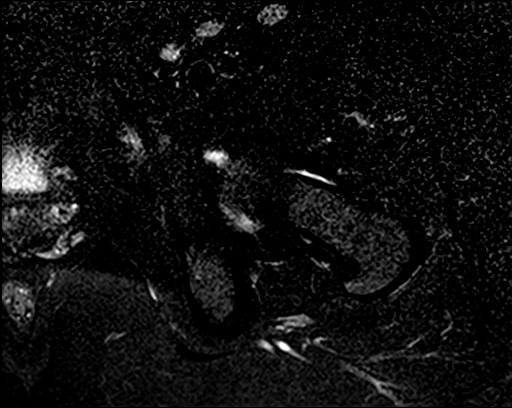
[im 14/24]
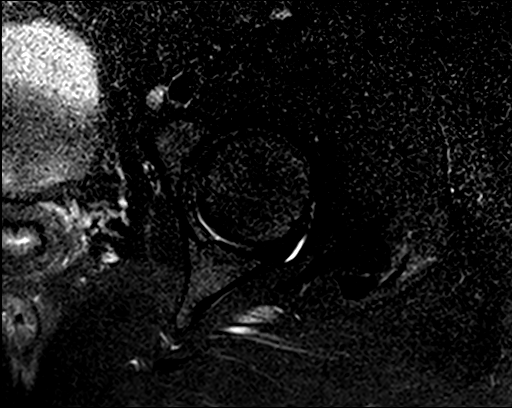
[im 17/24]
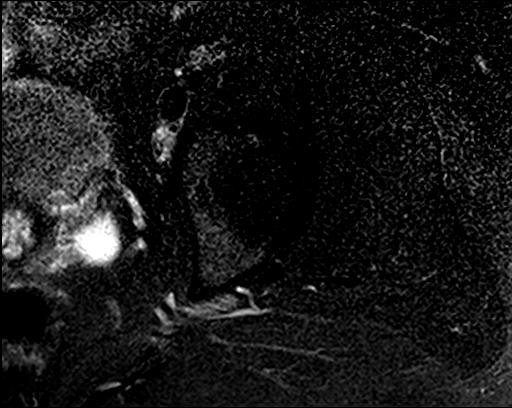
[im 20/24]
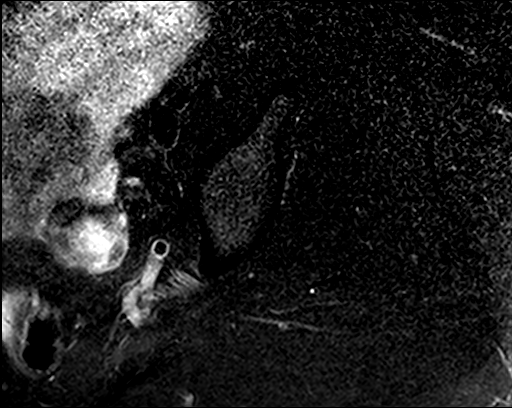
[im 24/24]
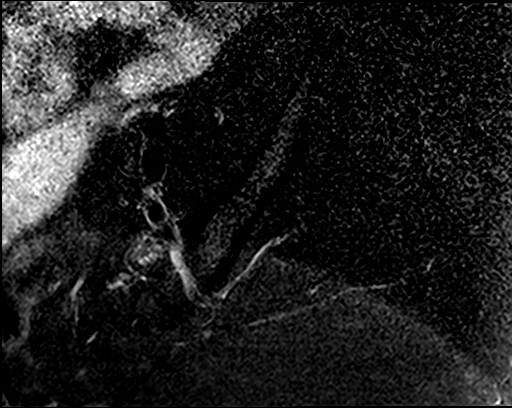

[Series 6: PD fat-sat · sagittal · 4.0mm · 0.70mm/px · 8 of 24 slices shown (1 of 2)]
[im 1/24]
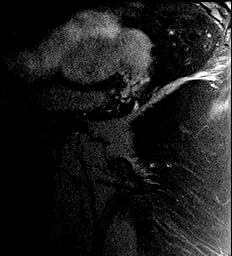
[im 4/24]
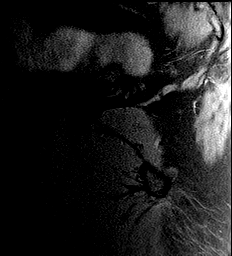
[im 7/24]
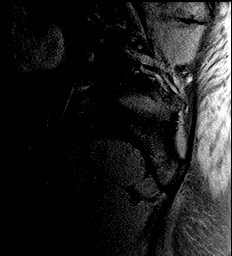
[im 10/24]
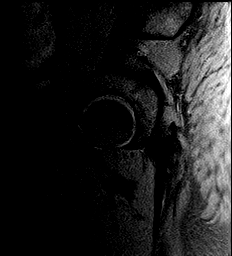
[im 14/24]
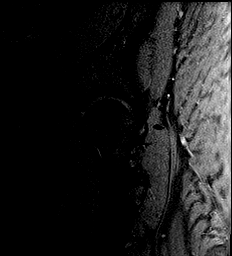
[im 17/24]
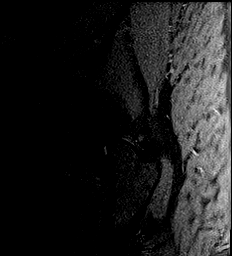
[im 20/24]
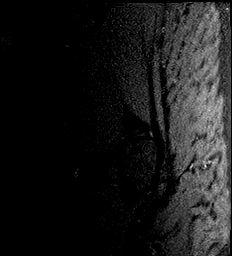
[im 24/24]
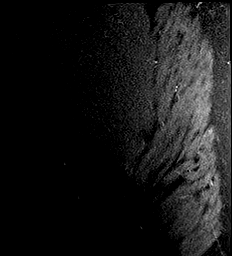

[Series 7: PD fat-sat · coronal · 4.0mm · 0.70mm/px · 7 of 19 slices shown (2 of 2)]
[im 1/19]
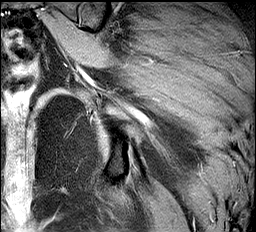
[im 4/19]
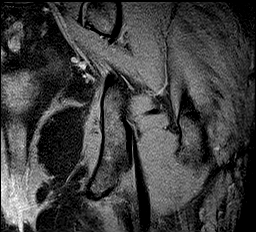
[im 7/19]
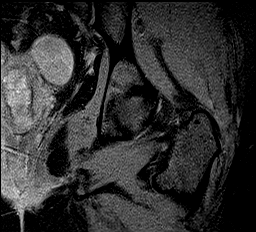
[im 10/19]
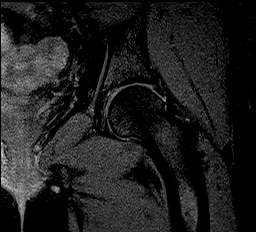
[im 13/19]
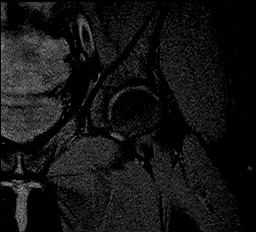
[im 16/19]
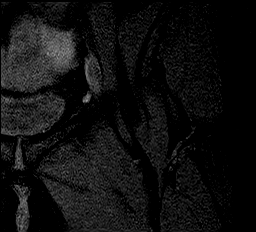
[im 19/19]
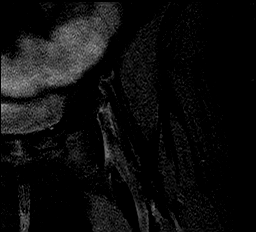

[40 of 40 positions shown; findings below may reference images not displayed]

FINDINGS: Bones: No hip fracture, dislocation or avascular necrosis. No
periosteal reaction or bone destruction. No aggressive osseous
lesion.

Normal sacrum and sacroiliac joints. No SI joint widening or erosive
changes.

Articular cartilage and labrum

Articular cartilage:  No chondral defect.

Labrum: Evaluation is limited secondary lack of intra-articular
fluid. Superior left labral degeneration.

Joint or bursal effusion

Joint effusion:  No hip joint effusion.  No SI joint effusion.

Bursae:  No bursa formation.

Muscles and tendons

Flexors: Normal.

Extensors: Normal.

Abductors: Normal.

Adductors: Normal.

Gluteals: Normal.

Hamstrings:

Small interstitial tear at the hamstring origins bilaterally.

Other findings

Miscellaneous: No pelvic free fluid. No fluid collection or
hematoma. No inguinal lymphadenopathy. No inguinal hernia.
IMPRESSION: 1. No acute hip fracture, dislocation or avascular necrosis.
2. Evaluation is limited secondary lack of intra-articular fluid.
Superior left labral degeneration.
3. Small interstitial tear at the hamstring origins bilaterally.

## 2021-02-25 ENCOUNTER — Other Ambulatory Visit: Payer: Self-pay | Admitting: Family Medicine

## 2021-02-28 ENCOUNTER — Ambulatory Visit (INDEPENDENT_AMBULATORY_CARE_PROVIDER_SITE_OTHER): Payer: BC Managed Care – PPO | Admitting: Family Medicine

## 2021-02-28 ENCOUNTER — Ambulatory Visit: Payer: BC Managed Care – PPO | Admitting: Psychology

## 2021-02-28 ENCOUNTER — Other Ambulatory Visit: Payer: Self-pay

## 2021-02-28 VITALS — BP 110/66 | Ht 61.75 in | Wt 228.0 lb

## 2021-02-28 DIAGNOSIS — G8929 Other chronic pain: Secondary | ICD-10-CM | POA: Diagnosis not present

## 2021-02-28 DIAGNOSIS — M25561 Pain in right knee: Secondary | ICD-10-CM | POA: Diagnosis not present

## 2021-02-28 DIAGNOSIS — M25562 Pain in left knee: Secondary | ICD-10-CM

## 2021-02-28 MED ORDER — METHYLPREDNISOLONE ACETATE 40 MG/ML IJ SUSP
40.0000 mg | Freq: Once | INTRAMUSCULAR | Status: AC
  Administered 2021-02-28: 40 mg via INTRA_ARTICULAR

## 2021-03-01 ENCOUNTER — Encounter: Payer: Self-pay | Admitting: Family Medicine

## 2021-03-01 NOTE — Progress Notes (Signed)
PCP: Laurey Morale, MD  Subjective:   HPI: Patient is a 45 y.o. female here for bilateral knee pain.  Patient reports she had done well up until this past Thursday/friday. Had to be on her feet for longer period than usual and even wearing braces she still had worsening pain bilateral knees. Right knee pain medial with some lateral. Left knee pain medial and posterior. Associated swelling especially of right knee. Pain with walking. No new injuries.  Past Medical History:  Diagnosis Date  . Anxiety   . Arthritis   . Asthma   . Bowel obstruction ([place])   . Cardiac arrhythmia   . Depression    sees Dr. Matilde Haymaker in [place]   . Diabetes mellitus    sees Dr. Larna Daughters Averneni   . GERD (gastroesophageal reflux disease)   . Hyperlipidemia   . Hypertension   . Hypothyroidism    sees Dr. Elyse Hsu  . Insomnia   . Migraine syndrome    sees Dr. Teodora Medici at [facility]   . Morbid obesity [facility])   . 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 Neck pain   . Neuropathy associated with endocrine disorder ([place])   . Sleep apnea with use of continuous positive airway pressure (CPAP)    Sleep apnea is resolved due to weight loss. 08/2017  . 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 Stroke [facility]) 04/2016   sees Dr. Teodora Medici at [facility]     Current Outpatient Medications on File Prior to Visit  Medication Sig Dispense Refill  . albuterol (PROAIR HFA) 108 (90 Base) MCG/ACT inhaler Inhale 2 puffs into the lungs every 4 (four) hours as needed for wheezing. 8.5 g 11  . ARIPiprazole (ABILIFY) 30 MG tablet Take 30 mg by mouth daily.    . baclofen (LIORESAL) 10 MG tablet     . budesonide-formoterol (SYMBICORT) 160-4.5 MCG/ACT inhaler Inhale 2 puffs into the lungs 2 (two) times daily. 1 Inhaler 11  . buPROPion (WELLBUTRIN XL) 150 MG 24 hr tablet     . clopidogrel (PLAVIX) 75 MG tablet TAKE ONE TABLET BY MOUTH DAILY 90 tablet 0  . diphenoxylate-atropine (LOMOTIL) 2.5-0.025 MG tablet Take 1 tablet by  mouth every 6 (six) hours as needed for diarrhea or loose stools. 30 tablet 0  . gabapentin (NEURONTIN) 100 MG capsule Take 100 mg by mouth 3 (three) times daily.    Marland Kitchen gabapentin (NEURONTIN) 300 MG capsule TAKE ONE CAPSULE BY MOUTH THREE TIMES A DAY 90 capsule 2  . HYDROcodone-acetaminophen (NORCO) 5-325 MG tablet Take 1 tablet by mouth every 6 (six) hours as needed for moderate pain. 30 tablet 0  . hydrOXYzine (ATARAX/VISTARIL) 50 MG tablet Take 50 mg by mouth. Take 1 tablet every night and may take 3 other times as needed    . hydrOXYzine (VISTARIL) 50 MG capsule Take 50 mg by mouth 3 (three) times daily.    . hyoscyamine (LEVSIN SL) 0.125 MG SL tablet Place 1 tablet (0.125 mg total) under the tongue every 6 (six) hours as needed. 30 tablet 1  . insulin aspart (NOVOLOG) 100 UNIT/ML injection Use in the insulin pump as directed 10 mL 0  . levothyroxine (SYNTHROID, LEVOTHROID) 125 MCG tablet Take 1 tablet (125 mcg total) by mouth daily before breakfast. 90 tablet 3  . metoprolol succinate (TOPROL-XL) 25 MG 24 hr tablet Take 0.5 tablets (12.5 mg total) by mouth daily. 30 tablet 0  . montelukast (SINGULAIR) 10 MG tablet Take  1 tablet by mouth daily.    . Multiple Vitamin (MULTIVITAMIN) tablet Take 1 tablet by mouth daily.    . norethindrone (MICRONOR,CAMILA,ERRIN) 0.35 MG tablet     . nystatin [facility]) powder Apply topically QID. 60 g 0  . OZEMPIC, 0.25 OR 0.5 MG/DOSE, 2 MG/1.5ML SOPN Inject into the skin.    . pantoprazole (PROTONIX) 40 MG tablet TAKE ONE TABLET BY MOUTH TWICE A DAY 180 tablet 2  . prazosin (MINIPRESS) 5 MG capsule Take 10 mg by mouth at bedtime.    . promethazine (PHENERGAN) 25 MG tablet Take 1 tablet (25 mg total) by mouth every 4 (four) hours as needed for nausea or vomiting. 60 tablet 1  . rosuvastatin (CRESTOR) 20 MG tablet Take 1 tablet (20 mg total) by mouth daily. 30 tablet 6  . topiramate (TOPAMAX) 100 MG tablet Take 100 mg by mouth 2 (two) times daily.     . traZODone  (DESYREL) 150 MG tablet Take by mouth at bedtime.    . valACYclovir (VALTREX) 1000 MG tablet Take 1 tablet (1,000 mg total) by mouth 3 (three) times daily. 30 tablet 0   No current facility-administered medications on file prior to visit.    Past Surgical History:  Procedure Laterality Date  . blocked itestinal repair  34   age 50 months  . BREAST BIOPSY Left 2017  . CHOLECYSTECTOMY  2011  . INDUCED ABORTION  1996   forced abortion  . INGUINAL HERNIA REPAIR Left 1980   age 34  . LAPAROSCOPIC ENDOMETRIOSIS FULGURATION  1998  . WISDOM TOOTH EXTRACTION  2007    Allergies  Allergen Reactions  . Macrobid [Nitrofurantoin] Hives  . Metformin And Related Other (See Comments)    Abdominal cramping   . Byetta 10 Mcg Pen [Exenatide] Hives  . Clindamycin/Lincomycin Nausea And Vomiting  . Geodon [Ziprasidone Hcl]   . Lipitor [Atorvastatin]     Per patient this causes cramps  . Nsaids     Upset stomach   . Penicillins Other (See Comments)    Has patient had a PCN reaction causing immediate rash, facial/tongue/throat swelling, SOB or lightheadedness with hypotension: No Has patient had a PCN reaction causing severe rash involving mucus membranes or skin necrosis: No Has patient had a PCN reaction that required hospitalization No Has patient had a PCN reaction occurring within the last 10 years: No If all of the above answers are "NO", then may proceed with Cephalosporin use.   Danelle Berry [Dulaglutide]     Abdominal pain   . Avocado Diarrhea and Other (See Comments)    Severe cramping, sweats, and diarrhea in upper GI/stomach  . Tramadol Hcl Itching and Rash    Social History   Socioeconomic History  . Marital status: Significant Other    Spouse name: Not on file  . Number of children: 5  . Years of education: Not on file  . Highest education level: Not on file  Occupational History  . Occupation: Nurse, learning disability  Tobacco Use  . Smoking status: Current Every  Day Smoker    Packs/day: 0.50    Years: 0.50    Pack years: 0.25    Types: Cigarettes    Start date: 02/24/2018  . Smokeless tobacco: Never Used  . Tobacco comment: has decreased how much - trying to quit  Vaping Use  . Vaping Use: Never used  Substance and Sexual Activity  . Alcohol use: No    Alcohol/week: 0.0 standard drinks  . Drug  use: Yes    Types: Marijuana    Comment: 2/10 last marijuana use  . Sexual activity: Not on file  Other Topics Concern  . Not on file  Social History Narrative  . Not on file   Social Determinants of Health   Financial Resource Strain: Not on file  Food Insecurity: Not on file  Transportation Needs: Not on file  Physical Activity: Not on file  Stress: Not on file  Social Connections: Not on file  Intimate Partner Violence: Not on file    Family History  Adopted: Yes  Problem Relation Age of Onset  . Heart disease Other        on both sides of family  . Diabetes Other        on both sides of family    BP 110/66   Ht 5' 1.75" (1.568 m)   Wt 228 lb (103.4 kg)   BMI 42.04 kg/m   No flowsheet data found.  No flowsheet data found.  Review of Systems: See HPI above.     Objective:  Physical Exam:  Gen: NAD, comfortable in exam room  Right knee: Mild effusion.  No other gross deformity, ecchymoses. TTP medial > lateral joint line. FROM with normal strength. Negative ant/post drawers. Negative valgus/varus testing. Negative lachman. Negative mcmurrays, apleys. NV intact distally.  Left knee: No effusion.  No other gross deformity, ecchymoses. TTP medial > lateral joint line. FROM with normal strength. Negative ant/post drawers. Negative valgus/varus testing. Negative lachman. Negative mcmurrays, apleys. NV intact distally.   Assessment & Plan:  1. Bilateral knee pain - 2/2 medial osteoarthritis.  Physical therapy was on hold briefly with her event recorder but she's scheduled to restart this.  Continuing to see  dietitian and work on weight loss.  Repeated steroid injections today hopefully for some relief as well.  F/u prn.  After informed written consent timeout was performed, patient was seated on exam table. Right knee was prepped with alcohol swab and utilizing anteromedial approach, patient's right knee was injected intraarticularly with 3:1 lidocaine: depomedrol. Patient tolerated the procedure well without immediate complications.  After informed written consent timeout was performed, patient was seated on exam table. Left knee was prepped with alcohol swab and utilizing anteromedial approach, patient's left knee was injected intraarticularly with 3:1 lidocaine: depomedrol. Patient tolerated the procedure well without immediate complications.

## 2021-03-14 ENCOUNTER — Ambulatory Visit: Payer: BC Managed Care – PPO | Admitting: Psychology

## 2021-03-23 ENCOUNTER — Ambulatory Visit: Payer: Self-pay

## 2021-03-23 ENCOUNTER — Encounter: Payer: Self-pay | Admitting: Orthopedic Surgery

## 2021-03-23 ENCOUNTER — Ambulatory Visit (INDEPENDENT_AMBULATORY_CARE_PROVIDER_SITE_OTHER): Payer: BC Managed Care – PPO | Admitting: Orthopedic Surgery

## 2021-03-23 VITALS — Ht 61.5 in | Wt 220.8 lb

## 2021-03-23 DIAGNOSIS — M1711 Unilateral primary osteoarthritis, right knee: Secondary | ICD-10-CM

## 2021-03-23 DIAGNOSIS — M25552 Pain in left hip: Secondary | ICD-10-CM | POA: Diagnosis not present

## 2021-03-23 DIAGNOSIS — M1712 Unilateral primary osteoarthritis, left knee: Secondary | ICD-10-CM | POA: Diagnosis not present

## 2021-03-25 ENCOUNTER — Encounter: Payer: Self-pay | Admitting: Orthopedic Surgery

## 2021-03-25 NOTE — Progress Notes (Signed)
Office Visit Note   Patient: Miranda [name]           Date of Birth: 03-15-1976           MRN: [id number] Visit Date: 03/23/2021 Requested by: Laurey Morale, MD [place],  [place] [id number] PCP: Laurey Morale, MD  Subjective: Chief Complaint  Patient presents with  . Right Knee - Pain  . Left Knee - Pain    HPI: Miranda [name] is a 45 y.o. female who presents to the office complaining of bilateral knee pain.  She has had years of bilateral knee pain.  She has been told she needs knee replacement in the past.  Left knee is giving her more pain but typically they are about equal.  She has radiation of pain into her shins.  Pain is worse with bad weather.  She uses knee braces and has to take hydrocodone on on occasion.  She is currently in a weight management plan at [facility] which is working well for her.  She has recently started taking Ozempic for her diabetes which has helped with her weight loss and glycemic control.  Last A1c was 7.3 on 12/14/2020.  Pain does occasionally wake her up at night.  She notes occasional groin pain and she has a history of a left-sided labral tear of her hip that was diagnosed by MRI in the past but she has done physical therapy for it which has helped.  Denies any history of hip/knee/back surgery.  She works as a Scientist, physiological and she lives at home with her boyfriend and some roommates.  Denies any history of DVT or PE.  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  Her cardiologist is Dr Mahala Menghini.  She has a history of "irregular heart rhythm" and she has recently worn a heart monitor for 2 weeks but is waiting on evaluation for this.  Last cortisone injections was on 02/28/2021 in both knees..                ROS: All systems reviewed are negative as they relate to the chief complaint within the history of present illness.  Patient denies fevers or chills.  Assessment & Plan: Visit Diagnoses:  1. Pain  in left hip   2. Unilateral primary osteoarthritis, left knee   3. Unilateral primary osteoarthritis, right knee     Plan: Patient is a 45 year old female who has long history of bilateral knee pain.  She has severe bilateral knee arthritis, worst in the medial compartment of both knees.  Last injections in early May.  She would like to proceed with total knee arthroplasty.  Current BMI is 41 but she is having success with her weight management program at [facility] to post patient for right total knee arthroplasty in August which will be about 3 months after her most recent injection.  Diabetes is currently under control with A1c 7.3.  She does have history of multiple strokes and has a cardiologist who is evaluating her irregular heart rhythm, plan for cardiac risk stratification prior to surgery.  Also needs ABI prior to surgery of both lower extremities.  She had trace palpable pulses of both feet on exam and  they were able to be identified with Doppler.  She is on Plavix.  Discussed the risks and benefits of the surgery including risk of nerve/vessel damage, knee stiffness, knee instability, DVT/PE, medical complication  from surgery, prosthetic joint infection, need for revision surgery, intraoperative fracture.  After lengthy discussion, patient wishes to proceed with surgery.  She will follow-up several weeks prior to procedure for BMI check.  Follow-Up Instructions: No follow-ups on file.   Orders:  Orders Placed This Encounter  Procedures  . XR KNEE 3 VIEW LEFT  . XR KNEE 3 VIEW RIGHT  . XR HIP UNILAT W OR W/O PELVIS 2-3 VIEWS LEFT   No orders of the defined types were placed in this encounter.     Procedures: No procedures performed   Clinical Data: No additional findings.  Objective: Vital Signs: Ht 5' 1.5" (1.562 m)   Wt 220 lb 12.8 oz (100.2 kg)   BMI 41.04 kg/m   Physical Exam:  Constitutional: Patient appears well-developed HEENT:  Head:  Normocephalic Eyes:EOM are normal Neck: Normal range of motion Cardiovascular: Normal rate Pulmonary/chest: Effort normal Neurologic: Patient is alert Skin: Skin is warm Psychiatric: Patient has normal mood and affect  Ortho Exam: Ortho exam demonstrates left knee with 0 degrees extension and 120 degrees of knee flexion.  Right knee with 0 degrees extension and 120 degrees of knee flexion.  No calf tenderness bilaterally.  No pain with right sided hip range of motion.  There is some increased pain in the left groin with left hip internal rotation.  Positive shoulder sign on the left.  Negative on the right.  Able to perform straight leg raise without extensor lag bilaterally.  She has trace palpable DP or PT pulses bilaterally.  Ultrasound Doppler was applied to the dorsum of both feet and pulses were identified bilaterally.  Specialty Comments:  No specialty comments available.  Imaging: No results found.   PMFS History: Patient Active Problem List   Diagnosis Date Noted  . Primary osteoarthritis of both knees 10/28/2020  . Irritable bowel syndrome with diarrhea 10/16/2019  . Tendinitis of right rotator cuff 09/17/2019  . Hand pain, right 05/21/2019  . Labral tear of hip joint 05/21/2019  . Type 2 diabetes mellitus with diabetic neuropathy, unspecified ([place]) 10/01/2018  . Right hip pain 05/22/2018  . De Quervain's disease (radial styloid tenosynovitis) 04/02/2018  . Carpal tunnel syndrome, right upper limb 12/31/2017  . Elbow injury, right, initial encounter 12/16/2017  . Chronic pain of both shoulders 11/30/2017  . PTSD (post-traumatic stress disorder) 10/25/2016  . 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 Diabetes mellitus ([place]) 10/25/2016  . Major depressive disorder, recurrent episode, severe, with psychosis ([place]) 10/20/2016  . Major depressive disorder, recurrent episode, severe, 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[place]) 10/19/2016  . TIA (transient ischemic attack)  05/16/2016  . Arterial ischemic stroke, MCA, left, acute ([place]) 03/29/2016  . Plantar fasciitis 08/08/2013  . Obesity, Class III, BMI 40-49.9 (morbid obesity) ([place]) 04/09/2013  . Unspecified sleep apnea 03/31/2013  . PCO (polycystic ovaries) 04/11/2012  . Neck pain 11/21/2010  . FATTY LIVER DISEASE 01/26/2010  . NAUSEA WITH VOMITING 01/26/2010  . PORTAL HYPERTENSION 01/26/2010  . Migraine headache 06/28/2009  . BREAST MASS 08/21/2008  . KNEE PAIN 04/14/2008  . Hypothyroidism 03/17/2008  . Hyperlipidemia 03/17/2008  . DEPRESSION 03/17/2008  . NEUROPATHY 03/17/2008  . Essential hypertension 03/17/2008  . ASTHMA 03/17/2008  . GERD 03/17/2008  . Headache 03/17/2008   Past Medical History:  Diagnosis Date  . Anxiety   . Arthritis   . Asthma   . Bowel obstruction ([place])   . Cardiac arrhythmia   . Depression    sees Dr. Matilde Haymaker in [place]   .  Diabetes mellitus    sees Dr. Larna Daughters [place]   . GERD (gastroesophageal reflux disease)   . Hyperlipidemia   . Hypertension   . Hypothyroidism    sees Dr. Elyse Hsu  . Insomnia   . Migraine syndrome    sees Dr. Teodora Medici at [facility]   . Morbid obesity [facility])   . 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 Neck pain   . Neuropathy associated with endocrine disorder ([place])   . Sleep apnea with use of continuous positive airway pressure (CPAP)    Sleep apnea is resolved due to weight loss. 08/2017  . Stroke [facility]) 03-25-16   left MCA   . Stroke [facility]) 04/2016   sees Dr. Teodora Medici at [place] History  Adopted: Yes  Problem Relation Age of Onset  . Heart disease Other        on both sides of family  . Diabetes Other        on both sides of family    Past Surgical History:  Procedure Laterality Date  . blocked itestinal repair  53   age 64 months  . BREAST BIOPSY Left 2017  . CHOLECYSTECTOMY  2011  . INDUCED ABORTION  1996   forced abortion  . INGUINAL HERNIA REPAIR Left 1980   age 47  . LAPAROSCOPIC  ENDOMETRIOSIS FULGURATION  1998  . WISDOM TOOTH EXTRACTION  2007   Social History   Occupational History  . Occupation: Nurse, learning disability  Tobacco Use  . Smoking status: Current Every Day Smoker    Packs/day: 0.50    Years: 0.50    Pack years: 0.25    Types: Cigarettes    Start date: 02/24/2018  . Smokeless tobacco: Never Used  . Tobacco comment: has decreased how much - trying to quit  Vaping Use  . Vaping Use: Never used  Substance and Sexual Activity  . Alcohol use: No    Alcohol/week: 0.0 standard drinks  . Drug use: Yes    Types: Marijuana    Comment: 2/10 last marijuana use  . Sexual activity: Not on file

## 2021-03-26 ENCOUNTER — Encounter: Payer: Self-pay | Admitting: Orthopedic Surgery

## 2021-03-31 ENCOUNTER — Telehealth: Payer: Self-pay | Admitting: Orthopedic Surgery

## 2021-03-31 NOTE — Telephone Encounter (Signed)
Patient would like to proceed with right total knee.  She has asked [place] to hold the date of Sept 6th at 7:30am for her.  Ok to send clearance to patient's cardiologist Dr. Minna Merritts in [facility] asking for perioperative instructions for Plavix?  Also patient is asking if and when she will need to stop Ozempic (weightloss/diabetic) Rx.  Her endocrinologist provider is Celso Amy, NP.  Please advise if clearance request is in order for this provider.      Pt's cb  [phone]

## 2021-04-01 ENCOUNTER — Other Ambulatory Visit: Payer: Self-pay

## 2021-04-01 DIAGNOSIS — M1711 Unilateral primary osteoarthritis, right knee: Secondary | ICD-10-CM

## 2021-04-01 DIAGNOSIS — M1712 Unilateral primary osteoarthritis, left knee: Secondary | ICD-10-CM

## 2021-04-01 NOTE — Telephone Encounter (Signed)
thx

## 2021-04-01 NOTE — Telephone Encounter (Signed)
Filled out blue sheet, gave to debbie

## 2021-04-01 NOTE — Telephone Encounter (Signed)
Yes for cardiac risk ratification with [facility] cardiologist.  Please ask the endocrine provider about when to stop the weight loss drug.  Patient also needs ABIs sometime before the procedure.  Also please have Lurena Joiner fill out the blue sheet CPT code [phone] 3 hours spinal adductor canal block and Stryker press-fit knee.  Thanks

## 2021-04-11 ENCOUNTER — Ambulatory Visit: Payer: BC Managed Care – PPO | Admitting: Psychology

## 2021-04-21 ENCOUNTER — Ambulatory Visit (INDEPENDENT_AMBULATORY_CARE_PROVIDER_SITE_OTHER): Payer: BC Managed Care – PPO

## 2021-04-21 ENCOUNTER — Other Ambulatory Visit: Payer: Self-pay

## 2021-04-21 DIAGNOSIS — M1712 Unilateral primary osteoarthritis, left knee: Secondary | ICD-10-CM | POA: Diagnosis not present

## 2021-04-21 DIAGNOSIS — M1711 Unilateral primary osteoarthritis, right knee: Secondary | ICD-10-CM

## 2021-04-25 ENCOUNTER — Ambulatory Visit: Payer: BC Managed Care – PPO | Admitting: Psychology

## 2021-04-29 ENCOUNTER — Encounter: Payer: Self-pay | Admitting: Gastroenterology

## 2021-04-29 ENCOUNTER — Other Ambulatory Visit: Payer: Self-pay | Admitting: Obstetrics and Gynecology

## 2021-04-29 ENCOUNTER — Ambulatory Visit (INDEPENDENT_AMBULATORY_CARE_PROVIDER_SITE_OTHER): Payer: BC Managed Care – PPO | Admitting: Gastroenterology

## 2021-04-29 VITALS — BP 120/78 | HR 59 | Ht 61.25 in | Wt 219.0 lb

## 2021-04-29 DIAGNOSIS — K529 Noninfective gastroenteritis and colitis, unspecified: Secondary | ICD-10-CM | POA: Insufficient documentation

## 2021-04-29 DIAGNOSIS — N6320 Unspecified lump in the left breast, unspecified quadrant: Secondary | ICD-10-CM

## 2021-04-29 DIAGNOSIS — R142 Eructation: Secondary | ICD-10-CM | POA: Diagnosis not present

## 2021-04-29 DIAGNOSIS — K219 Gastro-esophageal reflux disease without esophagitis: Secondary | ICD-10-CM

## 2021-04-29 DIAGNOSIS — N6310 Unspecified lump in the right breast, unspecified quadrant: Secondary | ICD-10-CM

## 2021-04-29 MED ORDER — CHOLESTYRAMINE 4 G PO PACK: 4.0000 g | PACK | Freq: Every day | ORAL | 5 refills | Status: DC

## 2021-04-29 NOTE — Progress Notes (Signed)
04/29/2021 Miranda [name] [id number] December 09, 1975   HISTORY OF PRESENT ILLNESS: This is a 45 year old female who is a patient of Dr. Silvio Pate.  She was last seen by me in December 2020.  She has chronic complaints of diarrhea, thought to be a component of IBS.  Previous celiac labs were negative.  Has been longstanding diabetic.  Is status postcholecystectomy years ago.  She presents here today complaining again of diarrhea.  Says that she sometimes has fecal incontinence even at nighttime while sleeping.  She denies any blood in her stool.  She has some generalized abdominal pain, thinks that some of it may be due to her Ozempic.  She also complains of some foul-smelling burps.  She says that they smell like sulfur.  This happens all the time.  As stated above she is longstanding diabetic, but most recently her hemoglobin A1c has been 6.1.  She is on pantoprazole 40 mg twice daily.  Is also currently taking Pepcid 20 mg twice daily as she had been using some ibuprofen for some joint pain.  EGD and colonoscopy in 12/2015 showed the following:  ENDOSCOPIC IMPRESSION: 1. LA Class B esophagitis 2. Nodule in the gastric antrum; multiple biopsies performed 3. Small hiatal hernia  ENDOSCOPIC IMPRESSION: 1. Sessile polyp in the sigmoid colon; polypectomy performed with cold forceps 2. The colonic mucosa otherwise appeared normal; multiple random biopsies performed 3. The examined terminal ileum appeared to be normal  1. Surgical [P], random sites -BENIGN COLONIC MUCOSA WITH NO HISTOPATHOLOGIC ABNORMALITY. -NO EVIDENCE OF LYMPHOCYTIC OR COLLAGENOUS COLITIS, INFLAMMATORY BOWEL DISEASE OR MALIGNANCY. 2. Surgical [P], sigmoid, polyp -HYPERPLASTIC COLONIC POLYP. -NO ADENOMATOUS CHANGE OR MALIGNANCY IDENTIFIED. 3. Surgical [P], gastric anturm nodule -CHRONIC GASTRITIS WITH MILD REACTIVE GASTROPATHY. -A WARTHIN-STARRY STAIN IS NEGATIVE FOR HELICOBACTER PYLORI. -NEGATIVE FOR INTESTINAL METAPLASIA OR  MALIGNANCY.   Past Medical History:  Diagnosis Date   Anxiety    Arthritis    Asthma    Bowel obstruction (H[facility])    Cardiac arrhythmia    Depression    sees Dr. Matilde Haymaker in [place]    Diabetes mellitus    sees Dr. Madelin Rear    GERD (gastroesophageal reflux disease)    Hyperlipidemia    Hypertension    Hypothyroidism    sees Dr. Elyse Hsu   Insomnia    Migraine syndrome    se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 08/2017   Stroke [facility]) 03-25-16   left MCA    Stroke [facility]) 04/2016   sees Dr. Teodora Medici at [facility]    Past Surgical History:  Procedure Laterality Date   blocked itestinal repair  1975   age 50 months   BREAST BIOPSY Left 2017   CHOLECYSTECTOMY  2011   INDUCED ABORTION  1996   forced abortion   INGUINAL HERNIA REPAIR Left 1980   age 74   [place] EXTRACTION  2007    reports that she has been smoking cigarettes. She started smoking about 3 years ago. She has a 0.25 pack-year smoking history. She has never used smokeless tobacco. She reports current drug use. Drug: Marijuana. She reports that she does not  drink alcohol. family history includes Diabetes in an other family member; Heart disease in an other family member. She was adopted. Allergies  Allergen Reactions   Macrobid [Nitrofurantoin] Hives   Metformin And Related Other (See Comments)    Abdominal cramping    Byetta 10 Mcg Pen [Exenatide] Hives   Clindamycin/Lincomycin Nausea And Vomiting   Geodon [Ziprasidone Hcl]    Lipitor [Atorvastatin]     Per patient this causes cramps   Nsaids     Upset stomach    Penicillins Other (See Comments)    Has patient had a PCN reaction causing immediate rash,  facial/tongue/throat swelling, SOB or lightheadedness with hypotension: No Has patient had a PCN reaction causing severe rash involving mucus membranes or skin necrosis: No Has patient had a PCN reaction that required hospitalization No Has patient had a PCN reaction occurring within the last 10 years: No If all of the above answers are "NO", then may proceed with Cephalosporin use.    Trulicity [Dulaglutide]     Abdominal pain    Avocado Diarrhea and Other (See Comments)    Severe cramping, sweats, and diarrhea in upper GI/stomach   Tramadol Hcl Itching and Rash      Outpatient Encounter Medications as of 04/29/2021  Medication Sig   albuterol (PROAIR HFA) 108 (90 Base) MCG/ACT inhaler Inhale 2 puffs into the lungs every 4 (four) hours as needed for wheezing.   ARIPiprazole (ABILIFY) 30 MG tablet Take 30 mg by mouth daily.   baclofen (LIORESAL) 10 MG tablet    budesonide-formoterol (SYMBICORT) 160-4.5 MCG/ACT inhaler Inhale 2 puffs into the lungs 2 (two) times daily.   buPROPion (WELLBUTRIN XL) 150 MG 24 hr tablet    clopidogrel (PLAVIX) 75 MG tablet TAKE ONE TABLET BY MOUTH DAILY   diphenoxylate-atropine (LOMOTIL) 2.5-0.025 MG tablet Take 1 tablet by mouth every 6 (six) hours as needed for diarrhea or loose stools.   gabapentin (NEURONTIN) 100 MG capsule Take 100 mg by mouth 3 (three) times daily.   gabapentin (NEURONTIN) 300 MG capsule TAKE ONE CAPSULE BY MOUTH THREE TIMES A DAY   HYDROcodone-acetaminophen (NORCO) 5-325 MG tablet Take 1 tablet by mouth every 6 (six) hours as needed for moderate pain.   hydrOXYzine (ATARAX/VISTARIL) 50 MG tablet Take 50 mg by mouth. Take 1 tablet every night and may take 3 other times as needed   hydrOXYzine (VISTARIL) 50 MG capsule Take 50 mg by mouth 3 (three) times daily.   hyoscyamine (LEVSIN SL) 0.125 MG SL tablet Place 1 tablet (0.125 mg total) under the tongue every 6 (six) hours as needed.   insulin aspart (NOVOLOG) 100 UNIT/ML injection Use in  the insulin pump as directed   levothyroxine (SYNTHROID, LEVOTHROID) 125 MCG tablet Take 1 tablet (125 mcg total) by mouth daily before breakfast.   lisinopril (ZESTRIL) 5 MG tablet Take 5 mg by mouth daily.   metoprolol succinate (TOPROL-XL) 25 MG 24 hr tablet Take 0.5 tablets (12.5 mg total) by mouth daily.   montelukast (SINGULAIR) 10 MG tablet Take 1 tablet by mouth daily.   Multiple Vitamin (MULTIVITAMIN) tablet Take 1 tablet by mouth daily.   nystatin [facility]) powder Apply topically QID.   OZEMPIC, 0.25 OR 0.5 MG/DOSE, 2 MG/1.5ML SOPN Inject into the skin.   pantoprazole (PROTONIX) 40 MG tablet TAKE ONE TABLET BY MOUTH TWICE A DAY   prazosin (MINIPRESS) 5 MG capsule Take 10 mg by mouth at bedtime.   promethazine (PHENERGAN) 25 MG tablet Take 1 tablet (25  mg total) by mouth every 4 (four) hours as needed for nausea or vomiting.   rosuvastatin (CRESTOR) 20 MG tablet Take 1 tablet (20 mg total) by mouth daily.   topiramate (TOPAMAX) 100 MG tablet Take 100 mg by mouth 2 (two) times daily.    traZODone (DESYREL) 150 MG tablet Take by mouth at bedtime.   valACYclovir (VALTREX) 1000 MG tablet Take 1 tablet (1,000 mg total) by mouth 3 (three) times daily.   [DISCONTINUED] norethindrone (MICRONOR,CAMILA,ERRIN) 0.35 MG tablet    No facility-administered encounter medications on file as of 04/29/2021.     REVIEW OF SYSTEMS  : All other systems reviewed and negative except where noted in the History of Present Illness.   PHYSICAL EXAM: BP 120/78   Pulse (!) 59   Ht 5' 1.25" (1.556 m)   Wt 219 lb (99.3 kg)   BMI 41.04 kg/m  General: Well developed white female in no acute distress Head: Normocephalic and atraumatic Eyes:  Sclerae anicteric, conjunctiva pink. Ears: Normal auditory acuity Lungs: Clear throughout to auscultation; no W/R/R. Heart: Regular rate and rhythm; no M/R/G. Abdomen: Soft, non-distended.  BS present.  Mild diffuse TTP. Musculoskeletal: Symmetrical with no gross  deformities  Skin: No lesions on visible extremities Extremities: No edema  Neurological: Alert oriented x 4, grossly non-focal Psychological:  Alert and cooperative. Normal mood and affect  ASSESSMENT AND PLAN: *45 year old female with complaints of diarrhea and incontinence:  Has what sounds like longstanding IBS-D.  Is also status post cholecystectomy so that likely contributes to her diarrhea as well.  Previous celiac labs were negative.  Colonoscopy negative as well.  Had previously used on Lomotil as needed.  That seemed to work, but was unsure if she could take it regularly.  We will try Questran 1 packet to be taken daily at lunchtime.  Prescription sent to pharmacy.  Could also consider testing for SIBO or empirically treated with a course of Xifaxan iit were approved by her insurance. *GERD and belching: He is on pantoprazole 40 mg twice daily and is currently taking Pepcid 20 mg twice a day as she has been using ibuprofen for joint pain as well.  Complains of sulfur burps constantly.  Has been diabetic for a long time although recently has been well controlled, hemoglobin A1c 6.1  ? Diabetic gastroparesis.  Will check GES.  CC:  Laurey Morale, MD

## 2021-04-29 NOTE — Patient Instructions (Signed)
If you are age 47 or older, your body mass index should be between 23-30. Your Body mass index is 41.04 kg/m. If this is out of the aforementioned range listed, please consider follow up with your Primary Care Provider.  If you are age 5 or younger, your body mass index should be between 19-25. Your Body mass index is 41.04 kg/m. If this is out of the aformentioned range listed, please consider follow up with your Primary Care Provider.   We have sent the following medications to your pharmacy for you to pick up at your convenience: Questran 1 packet at lunch time.  You have been scheduled for a gastric emptying scan at [facility] on 05/17/21 at 7:30am. Please arrive at least 15 minutes prior to your appointment for registration. Please make certain not to have anything to eat or drink after midnight the night before your test. Hold all stomach medications (ex: Zofran, phenergan, Reglan) 48 hours prior to your test. If you need to reschedule your appointment, please contact radiology scheduling at [phone]. _____________________________________________________________________ A gastric-emptying study measures how long it takes for food to move through your stomach. There are several ways to measure stomach emptying. In the most common test, you eat food that contains a small amount of radioactive material. A scanner that detects the movement of the radioactive material is placed over your abdomen to monitor the rate at which food leaves your stomach. This test normally takes about 4 hours to complete. _____________________________________________________________________   The [facility] providers would like to encourage you to use [facility] to communicate with providers for non-urgent requests or questions.  Due to long hold times on the telephone, sending your provider a message by [facility] may be a faster and more efficient way to get a response.  Please allow 48 business hours for a  response.  Please remember that this is for non-urgent requests.   It was a pleasure to see you today!  Thank you for trusting me with your gastrointestinal care!    Alonza Bogus, PA-C

## 2021-05-02 NOTE — Progress Notes (Signed)
Reviewed and agree with management plan.  Jarnell Cordaro T. Kesleigh Morson, MD FACG [phone]  

## 2021-05-09 ENCOUNTER — Ambulatory Visit
Admission: RE | Admit: 2021-05-09 | Discharge: 2021-05-09 | Disposition: A | Discharge status: Home / Self Care | Payer: BC Managed Care – PPO | Source: Ambulatory Visit | Attending: Obstetrics and Gynecology | Admitting: Obstetrics and Gynecology

## 2021-05-09 ENCOUNTER — Other Ambulatory Visit: Payer: Self-pay

## 2021-05-09 ENCOUNTER — Ambulatory Visit: Payer: BC Managed Care – PPO | Admitting: Psychology

## 2021-05-09 ENCOUNTER — Other Ambulatory Visit: Payer: BC Managed Care – PPO

## 2021-05-09 DIAGNOSIS — N6320 Unspecified lump in the left breast, unspecified quadrant: Secondary | ICD-10-CM

## 2021-05-09 DIAGNOSIS — N6310 Unspecified lump in the right breast, unspecified quadrant: Secondary | ICD-10-CM

## 2021-05-12 ENCOUNTER — Telehealth: Payer: Self-pay | Admitting: Orthopedic Surgery

## 2021-05-12 NOTE — Telephone Encounter (Signed)
Pt calling to verify when her surgery with Marlou Sa is sch. Did not see it on her chart for upcoming and wanted to check, pt did leave a vm but wanted to ask as well. The best call back number is [phone].

## 2021-05-13 ENCOUNTER — Telehealth: Payer: Self-pay | Admitting: Orthopedic Surgery

## 2021-05-13 NOTE — Telephone Encounter (Signed)
Patient has chosen a date of 07-05-21 for her right total knee arthroplasty.  Patient is asking if Dr. Marlou Sa thinks she could/can benefit from physical therapy prior to the procedure.  Patient has [employer].    Pt's cb  [phone]

## 2021-05-16 ENCOUNTER — Telehealth: Payer: Self-pay | Admitting: Gastroenterology

## 2021-05-16 ENCOUNTER — Other Ambulatory Visit: Payer: Self-pay

## 2021-05-16 DIAGNOSIS — K529 Noninfective gastroenteritis and colitis, unspecified: Secondary | ICD-10-CM

## 2021-05-16 DIAGNOSIS — R142 Eructation: Secondary | ICD-10-CM

## 2021-05-16 DIAGNOSIS — K219 Gastro-esophageal reflux disease without esophagitis: Secondary | ICD-10-CM

## 2021-05-16 NOTE — Telephone Encounter (Signed)
Please send to the CMA this was scheduled while in the office.

## 2021-05-16 NOTE — Telephone Encounter (Signed)
[place] for 1 visit and hep pls arrange for 6 weeks before tka thx

## 2021-05-16 NOTE — Telephone Encounter (Signed)
New order for gastric emptying scan entered. Sent to [facility] to sign.

## 2021-05-16 NOTE — Telephone Encounter (Signed)
Radiology called requesting an order for test to be signed patient is coming in tomorrow.

## 2021-05-17 ENCOUNTER — Other Ambulatory Visit: Payer: Self-pay

## 2021-05-17 ENCOUNTER — Encounter (HOSPITAL_COMMUNITY)
Admission: RE | Admit: 2021-05-17 | Discharge: 2021-05-17 | Disposition: A | Discharge status: Home / Self Care | Payer: BC Managed Care – PPO | Source: Ambulatory Visit | Attending: Gastroenterology | Admitting: Gastroenterology

## 2021-05-17 DIAGNOSIS — K529 Noninfective gastroenteritis and colitis, unspecified: Secondary | ICD-10-CM | POA: Insufficient documentation

## 2021-05-17 DIAGNOSIS — R142 Eructation: Secondary | ICD-10-CM | POA: Diagnosis present

## 2021-05-17 DIAGNOSIS — K219 Gastro-esophageal reflux disease without esophagitis: Secondary | ICD-10-CM | POA: Diagnosis present

## 2021-05-17 MED ORDER — TECHNETIUM TC 99M SULFUR COLLOID
2.2000 | Freq: Once | INTRAVENOUS | Status: AC | PRN
  Administered 2021-05-17: 2.2 via INTRAVENOUS

## 2021-05-17 NOTE — Telephone Encounter (Signed)
Tried calling to discuss with patient. No answer. No VM

## 2021-05-19 ENCOUNTER — Other Ambulatory Visit: Payer: Self-pay | Admitting: Family Medicine

## 2021-05-23 ENCOUNTER — Ambulatory Visit: Payer: BC Managed Care – PPO | Admitting: Psychology

## 2021-05-23 ENCOUNTER — Telehealth: Payer: Self-pay | Admitting: Orthopedic Surgery

## 2021-05-23 NOTE — Telephone Encounter (Signed)
Pt called and states she needs an extension on her handicap placker.   CB [phone]

## 2021-05-23 NOTE — Telephone Encounter (Signed)
Yeah okay for this.  Also she needs preop appointment prior to 07/05/21 surgery date for BMI check. Prob 1 month before so 8/6 - 8/10ish

## 2021-05-24 NOTE — Telephone Encounter (Signed)
Appt scheduled. Patient states will pick up placard renewal at time of OV.

## 2021-05-25 ENCOUNTER — Other Ambulatory Visit: Payer: Self-pay

## 2021-05-25 MED ORDER — PANTOPRAZOLE SODIUM 40 MG PO TBEC: 40.0000 mg | DELAYED_RELEASE_TABLET | Freq: Two times a day (BID) | ORAL | 2 refills | Status: DC

## 2021-06-01 ENCOUNTER — Telehealth: Payer: Self-pay

## 2021-06-01 MED ORDER — METOCLOPRAMIDE HCL 5 MG PO TABS: 2.5000 mg | ORAL_TABLET | Freq: Three times a day (TID) | ORAL | 1 refills | Status: DC

## 2021-06-01 NOTE — Telephone Encounter (Signed)
Letter from patient's psychiatrist has been received stating that the patient will be able to start Reglan 5 mg, but will initially need to start taking half a tablet instead of a full tablet. She will need to be monitored and have her blood pressure checked during the initial start of the Reglan. I have spoke with her pharmacy, Miranda [name], who has advised that the Reglan can be cut in half. I have explained to the pharmacy that the office will be sending in a script for her and that we are aware that there may be an interaction between Reglan and Wellbutrin. The patient has been contacted and discussed that Reglan has been sent to her pharmacy and that she will need to take half a tablet instead of the full tablet and that she will have 1 refill as well. A follow up has been made with Miranda Bogus, PA-C for evaluation of this new medication start. She is aware that her follow has been scheduled for 07/20/2021 at 10:30 am.

## 2021-06-06 ENCOUNTER — Ambulatory Visit: Payer: BC Managed Care – PPO | Admitting: Psychology

## 2021-06-08 ENCOUNTER — Encounter: Payer: Self-pay | Admitting: Orthopedic Surgery

## 2021-06-08 ENCOUNTER — Ambulatory Visit: Payer: BC Managed Care – PPO | Admitting: Orthopedic Surgery

## 2021-06-08 ENCOUNTER — Other Ambulatory Visit: Payer: Self-pay

## 2021-06-08 VITALS — Ht 61.25 in | Wt 211.4 lb

## 2021-06-08 DIAGNOSIS — M170 Bilateral primary osteoarthritis of knee: Secondary | ICD-10-CM

## 2021-06-08 DIAGNOSIS — M1711 Unilateral primary osteoarthritis, right knee: Secondary | ICD-10-CM | POA: Diagnosis not present

## 2021-06-08 DIAGNOSIS — M1712 Unilateral primary osteoarthritis, left knee: Secondary | ICD-10-CM | POA: Diagnosis not present

## 2021-06-08 NOTE — Progress Notes (Signed)
Office Visit Note   Patient: Miranda [name]           Date of Birth: 07-23-1976           MRN: [id number] Visit Date: 06/08/2021 Requested by: Vonita Moss, NP [address] [place],  [place] [address] PCP: Vonita Moss, NP  Subjective: No chief complaint on file.   HPI: [name] is a 45 year old patient with right knee pain.  She has left knee pain as well.  She has known history of bilateral knee arthritis.  The right knee has primarily medial sided pain but has a global component as well.  She has a history of strokes and takes Plavix.  She has seen cardiologist who has done restratification.              ROS: All systems reviewed are negative as they relate to the chief complaint within the history of present illness.  Patient denies  fevers or chills.   Assessment & Plan: Visit Diagnoses:  1. Unilateral primary osteoarthritis, left knee   2. Unilateral primary osteoarthritis, right knee     Plan: Impression is BMI is 39 today.  Plan total knee replacement in the near future.  The risk and benefits are discussed with the patient including but not limited to infection nerve vessel damage incomplete restoration of function and incomplete pain relief.  The grueling nature of the rehabilitative process is also discussed.  Patient has diminished her smoking considerably.  Hemoglobin A1c well within the acceptable range.  Patient understands risk and benefits.  All questions answered her perioperative Plavix plan has been arranged.  ABIs also within normal limits.  Follow-Up Instructions: No follow-ups on file.   Orders:  No orders of the defined types were placed in this encounter.  No orders of the defined types were placed in this encounter.     Procedures: No procedures performed   Clinical Data: No additional findings.  Objective: Vital Signs: Ht 5' 1.25" (1.556 m)   Wt 211 lb 6.4 oz (95.9 kg)   BMI 39.62 kg/m   Physical Exam:   Constitutional: Patient  appears well-developed HEENT:  Head: Normocephalic Eyes:EOM are normal Neck: Normal range of motion Cardiovascular: Normal rate Pulmonary/chest: Effort normal Neurologic: Patient is alert Skin: Skin is warm Psychiatric: Patient has normal mood and affect   Ortho Exam: Ortho exam demonstrates full active and passive range of motion of the ankle and hip.  Pedal pulses are trace palpable.  Range of motion on the right knee is just shy of full extension to 115 of flexion.  Collateral crucial ligaments are stable.  Patient has medial greater than lateral joint line tenderness with mild patellofemoral crepitus.  No other masses lymphadenopathy or skin changes noted in that right knee region ankle dorsiflexion intact.  Specialty Comments:  No specialty comments available.  Imaging: No results found.   PMFS History: Patient Active Problem List   Diagnosis Date Noted   Chronic diarrhea 04/29/2021   Belching 04/29/2021   Primary osteoarthritis of both knees 10/28/2020   Irritable bowel syndrome with diarrhea 10/16/2019   Tendinitis of right rotator cuff 09/17/2019   Hand pain, right 05/21/2019   Labral tear of hip joint 05/21/2019   Type 2 diabetes mellitus with diabetic neuropathy, unspecified ([place]) 10/01/2018   Right hip pain 05/22/2018   Harriet Pho disease (radial styloid tenosynovitis) 04/02/2018   Carpal tunnel syndrome, right upper limb 12/31/2017   Elbow injury, right, initial encounter 12/16/2017  Chronic pain of both shoulders 11/30/2017   PTSD (post-traumatic stress disorder) 10/25/2016   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   Diabetes mellitus ([place]) 10/25/2016   Major depressive disorder, recurrent episode, severe, with psychosis ([place]) 10/20/2016   Major depressive disorder, recurrent episode, severe, 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[place]) 10/19/2016   TIA (transient ischemic attack) 05/16/2016   Arterial ischemic stroke, MCA, left, acute ([place]) 03/29/2016   Plantar  fasciitis 08/08/2013   Obesity, Class III, BMI 40-49.9 (morbid obesity) ([place]) 04/09/2013   Unspecified sleep apnea 03/31/2013   PCO (polycystic ovaries) 04/11/2012   Neck pain 11/21/2010   FATTY LIVER DISEASE 01/26/2010   NAUSEA WITH VOMITING 01/26/2010   PORTAL HYPERTENSION 01/26/2010   Migraine headache 06/28/2009   BREAST MASS 08/21/2008   KNEE PAIN 04/14/2008   Hypothyroidism 03/17/2008   Hyperlipidemia 03/17/2008   DEPRESSION 03/17/2008   NEUROPATHY 03/17/2008   Essential hypertension 03/17/2008   ASTHMA 03/17/2008   GERD 03/17/2008   Headache 03/17/2008   Past Medical History:  Diagnosis Date   Anxiety    Arthritis    Asthma    Bowel obstruction (HCC)    Cardiac arrhythmia    Depression    sees Dr. Matilde Haymaker in [place]    Diabetes mellitus    sees Dr. Madelin Rear    GERD (gastroesophageal reflux disease)    Hyperlipidemia    Hypertension    Hypothyroidism    sees Dr. Elyse Hsu   Insomnia    Migraine syndrome    sees Dr. Teodora Medici at [facility]    Morbid obesity [facility])    Multiple personality ([place])    Neck pain    Neuropathy associated with endocrine disorder ([place])    Sleep apnea with use of continuous positive airway pressure (CPAP)    Sleep apnea is resolved due to weight loss. 08/2017   Stroke [facility]) 03-25-16   left MCA    Stroke ([place]) 04/2016   sees Dr. Teodora Medici at [place]  Adopted: Yes  Problem Relation Age of Onset   Heart disease Other        on both sides of family   Diabetes Other        on both sides of family    Past Surgical History:  Procedure Laterality Date   blocked itestinal repair  41   age 82 months   BREAST BIOPSY Left 2017   CHOLECYSTECTOMY  2011   INDUCED ABORTION  1996   forced abortion   INGUINAL HERNIA REPAIR Left 1980   age 10   LAPAROSCOPIC ENDOMETRIOSIS FULGURATION  1998   WISDOM TOOTH EXTRACTION  2007   Social History   Occupational History   Occupation: Child psychotherapist  Tobacco Use   Smoking status: Every Day    Packs/day: 0.50    Years: 0.50    Pack years: 0.25    Types: Cigarettes    Start date: 02/24/2018   Smokeless tobacco: Never   Tobacco comments:    has decreased how much - trying to quit  Vaping Use   Vaping Use: Never used  Substance and Sexual Activity   Alcohol use: No    Alcohol/week: 0.0 standard drinks   Drug use: Yes    Types: Marijuana    Comment: 2/10 last marijuana use   Sexual activity: Not on file

## 2021-06-21 NOTE — Pre-Procedure Instructions (Signed)
Surgical Instructions    Your procedure is scheduled on Tuesday, August 6th.  Report to [facility] Main Entrance "A" at 5:30 A.M., then check in with the Admitting office.  Call this number if you have problems the morning of surgery:  [phone]   If you have any questions prior to your surgery date call [phone]: Open Monday-Friday 8am-4pm    Remember:  Do not eat after midnight the night before your surgery  You may drink clear liquids until 4:30 a.m. the morning of your surgery.   Clear liquids allowed are: Water, Non-Citrus Juices (without pulp), Carbonated Beverages, Clear Tea, Black Coffee Only, and Gatorade.  Please complete your 10 oz bottle of water that was provided to you by 4:30 a.m. the morning of surgery.  Please, if able, drink it in one setting.    Take these medicines the morning of surgery with A SIP OF WATER  buPROPion (WELLBUTRIN XL)  famotidine (PEPCID) gabapentin (NEURONTIN)  hydrOXYzine (VISTARIL)  levothyroxine (SYNTHROID, LEVOTHROID) metoCLOPramide (REGLAN)  metoprolol succinate (TOPROL-XL) montelukast (SINGULAIR)  pantoprazole (PROTONIX) rosuvastatin (CRESTOR)  topiramate (TOPAMAX)  Albuterol inhaler-please bring with you on the day of surgery baclofen (LIORESAL) budesonide-formoterol (SYMBICORT) diphenoxylate-atropine (LOMOTIL) fluticasone (FLONASE) HYDROcodone-acetaminophen (NORCO)  loratadine (CLARITIN) 10  ondansetron (ZOFRAN-ODT) promethazine (PHENERGAN)  Rimegepant Sulfate (NURTEC) Ubrogepant (UBRELVY)  valACYclovir (VALTREX)   Follow your surgeon's instructions on when to stop clopidogrel (PLAVIX).  If no instructions were given by your surgeon then you will need to call the office to get those instructions.    As of today, STOP taking any Aspirin (unless otherwise instructed by your surgeon) Aleve, Naproxen, Ibuprofen, Motrin, Advil, Goody's, BC's, all herbal medications, fish oil, and all vitamins. This includes: diclofenac  Sodium (VOLTAREN) 1 % GEL.    WHAT DO I DO ABOUT MY DIABETES MEDICATION?  The day of surgery, do not take other diabetes injectables, OZEMPIC.   Patients with Insulin Pumps    For patients with Insulin Pumps: Contact your diabetes doctor for specific instructions before surgery. Decrease basal insulin rates by 20% at midnight the night before surgery. Do not remove your insulin pump prior to arrival to short stay the morning of surgery.  Anesthesia will instruct you on when to remove your pump. Note that if your surgery is planned to be longer than 2 hours, your insulin pump will be removed and intravenous (IV) insulin will be started and managed by the nurses and anesthesiologist. You will be able to restart your insulin pump once you are awake and able to manage it. Make sure to bring insulin pump supplies to the hospital with you in case your site needs to be changed.    HOW TO MANAGE YOUR DIABETES BEFORE AND AFTER SURGERY  Why is it important to control my blood sugar before and after surgery? Improving blood sugar levels before and after surgery helps healing and can limit problems. A way of improving blood sugar control is eating a healthy diet by:  Eating less sugar and carbohydrates  Increasing activity/exercise  Talking with your doctor about reaching your blood sugar goals High blood sugars (greater than 180 mg/dL) can raise your risk of infections and slow your recovery, so you will need to focus on controlling your diabetes during the weeks before surgery. Make sure that the doctor who takes care of your diabetes knows about your planned surgery including the date and location.  How do I manage my blood sugar before surgery? Check your blood sugar at least 4 times  a day, starting 2 days before surgery, to make sure that the level is not too high or low.  Check your blood sugar the morning of your surgery when you wake up and every 2 hours until you get to the Short Stay  unit.  If your blood sugar is less than 70 mg/dL, you will need to treat for low blood sugar: Do not take insulin. Treat a low blood sugar (less than 70 mg/dL) with  cup of clear juice (cranberry or apple), 4 glucose tablets, OR glucose gel. Recheck blood sugar in 15 minutes after treatment (to make sure it is greater than 70 mg/dL). If your blood sugar is not greater than 70 mg/dL on recheck, call [phone] for further instructions. Report your blood sugar to the short stay nurse when you get to Short Stay.  If you are admitted to the hospital after surgery: Your blood sugar will be checked by the staff and you will probably be given insulin after surgery (instead of oral diabetes medicines) to make sure you have good blood sugar levels. The goal for blood sugar control after surgery is 80-180 mg/dL.             Do NOT Smoke (Tobacco/Vaping) or drink Alcohol 24 hours prior to your procedure.  If you use a CPAP at night, you may bring all equipment for your overnight stay.   Contacts, glasses, piercing's, hearing aid's, dentures or partials may not be worn into surgery, please bring cases for these belongings.    For patients admitted to the hospital, discharge time will be determined by your treatment team.   Patients discharged the day of surgery will not be allowed to drive home, and someone needs to stay with them for 24 hours.  ONLY 1 SUPPORT PERSON MAY BE PRESENT WHILE YOU ARE IN SURGERY. IF YOU ARE TO BE ADMITTED ONCE YOU ARE IN YOUR ROOM YOU WILL BE ALLOWED TWO (2) VISITORS.  Minor children may have two parents present. Special consideration for safety and communication needs will be reviewed on a case by case basis.   Special instructions:   [facility]- Preparing For Surgery  Before surgery, you can play an important role. Because skin is not sterile, your skin needs to be as free of germs as possible. You can reduce the number of germs on your skin by washing with CHG  (chlorahexidine gluconate) Soap before surgery.  CHG is an antiseptic cleaner which kills germs and bonds with the skin to continue killing germs even after washing.    Oral Hygiene is also important to reduce your risk of infection.  Remember - BRUSH YOUR TEETH THE MORNING OF SURGERY WITH YOUR REGULAR TOOTHPASTE  Please do not use if you have an allergy to CHG or antibacterial soaps. If your skin becomes reddened/irritated stop using the CHG.  Do not shave (including legs and underarms) for at least 48 hours prior to first CHG shower. It is OK to shave your face.  Please follow these instructions carefully.   Shower the NIGHT BEFORE SURGERY and the MORNING OF SURGERY  If you chose to wash your hair, wash your hair first as usual with your normal shampoo.  After you shampoo, rinse your hair and body thoroughly to remove the shampoo.  Use CHG Soap as you would any other liquid soap. You can apply CHG directly to the skin and wash gently with a scrungie or a clean washcloth.   Apply the CHG Soap to your body  ONLY FROM THE NECK DOWN.  Do not use on open wounds or open sores. Avoid contact with your eyes, ears, mouth and genitals (private parts). Wash Face and genitals (private parts)  with your normal soap.   Wash thoroughly, paying special attention to the area where your surgery will be performed.  Thoroughly rinse your body with warm water from the neck down.  DO NOT shower/wash with your normal soap after using and rinsing off the CHG Soap.  Pat yourself dry with a CLEAN TOWEL.  Wear CLEAN PAJAMAS to bed the night before surgery  Place CLEAN SHEETS on your bed the night before your surgery  DO NOT SLEEP WITH PETS.   Day of Surgery: Shower with CHG soap. Do not wear jewelry, make up, nail polish, gel polish, artificial nails, or any other type of covering on natural nails including finger and toenails. If patients have artificial nails, gel coating, etc. that need to be removed  by a nail salon please have this removed prior to surgery. Surgery may need to be canceled/delayed if the surgeon/ anesthesia feels like the patient is unable to be adequately monitored. Do not wear lotions, powders, perfumes, or deodorant. Do not shave 48 hours prior to surgery.   Do not bring valuables to the hospital. [facility] is not responsible for any belongings or valuables. Wear Clean/Comfortable clothing the morning of surgery Remember to brush your teeth WITH YOUR REGULAR TOOTHPASTE.   Please read over the following fact sheets that you were given.

## 2021-06-22 ENCOUNTER — Other Ambulatory Visit: Payer: Self-pay

## 2021-06-22 ENCOUNTER — Telehealth: Payer: Self-pay | Admitting: Orthopedic Surgery

## 2021-06-22 ENCOUNTER — Encounter (HOSPITAL_COMMUNITY): Payer: Self-pay

## 2021-06-22 ENCOUNTER — Encounter (HOSPITAL_COMMUNITY)
Admission: RE | Admit: 2021-06-22 | Discharge: 2021-06-22 | Disposition: A | Discharge status: Home / Self Care | Payer: BC Managed Care – PPO | Source: Ambulatory Visit | Attending: Orthopedic Surgery | Admitting: Orthopedic Surgery

## 2021-06-22 DIAGNOSIS — Z01812 Encounter for preprocedural laboratory examination: Secondary | ICD-10-CM | POA: Diagnosis present

## 2021-06-22 HISTORY — DX: Dyspnea, unspecified: R06.00

## 2021-06-22 HISTORY — DX: Angina pectoris, unspecified: I20.9

## 2021-06-22 HISTORY — DX: Cardiac arrhythmia, unspecified: I49.9

## 2021-06-22 LAB — BASIC METABOLIC PANEL
Anion gap: 3 — ABNORMAL LOW (ref 5–15)
BUN: 12 mg/dL (ref 6–20)
CO2: 24 mmol/L (ref 22–32)
Calcium: 8.8 mg/dL — ABNORMAL LOW (ref 8.9–10.3)
Chloride: 111 mmol/L (ref 98–111)
Creatinine, Ser: 0.88 mg/dL (ref 0.44–1.00)
GFR, Estimated: >60 mL/min (ref >60)
Glucose, Bld: 219 mg/dL — ABNORMAL HIGH (ref 70–99)
Potassium: 3.3 mmol/L — ABNORMAL LOW (ref 3.5–5.1)
Sodium: 138 mmol/L (ref 135–145)

## 2021-06-22 LAB — CBC
HCT: 40.6 % (ref 36.0–46.0)
Hemoglobin: 13.9 g/dL (ref 12.0–15.0)
MCH: 30.8 pg (ref 26.0–34.0)
MCHC: 34.2 g/dL (ref 30.0–36.0)
MCV: 90 fL (ref 80.0–100.0)
Platelets: 193 10*3/uL (ref 150–400)
RBC: 4.51 MIL/uL (ref 3.87–5.11)
RDW: 12.6 % (ref 11.5–15.5)
WBC: 12.7 10*3/uL — ABNORMAL HIGH (ref 4.0–10.5)
nRBC: 0 % (ref 0.0–0.2)

## 2021-06-22 LAB — URINALYSIS, ROUTINE W REFLEX MICROSCOPIC
Bilirubin Urine: NEGATIVE
Glucose, UA: NEGATIVE mg/dL
Hgb urine dipstick: NEGATIVE
Ketones, ur: 15 mg/dL — AB
Leukocytes,Ua: NEGATIVE
Nitrite: NEGATIVE
Protein, ur: NEGATIVE mg/dL
Specific Gravity, Urine: >1.03 — ABNORMAL HIGH (ref 1.005–1.030)
pH: 5.5 (ref 5.0–8.0)

## 2021-06-22 LAB — SURGICAL PCR SCREEN
MRSA, PCR: NEGATIVE
Staphylococcus aureus: POSITIVE — AB

## 2021-06-22 LAB — GLUCOSE, CAPILLARY: Glucose-Capillary: 164 mg/dL — ABNORMAL HIGH (ref 70–99)

## 2021-06-22 NOTE — Telephone Encounter (Signed)
She needs to call her cardiologist and neurologist about optimal timing for Plavix discontinuation.  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

## 2021-06-22 NOTE — Telephone Encounter (Signed)
Patient called requesting a call back. Pt would like to know when she can stop taking plavix medication. Please call pt about this matter at [phone].

## 2021-06-22 NOTE — Progress Notes (Signed)
PCP: Vonita Moss, NP Cardiologist: Mahala Menghini, MD and Bartholomew Boards, MD  EKG: 06/17/21 CE - requested tracing CXR: na ECHO: 02/11/21 CE - requested record Stress Test: 01/08/19 - requested record.  Patient has another stress test 06/23/21 Cardiac Cath: denies  Fasting Blood Sugar- 90-150 Checks Blood Sugar: Insulin Pump  OSA/CPAP: Yes, wears cpap nightly  ASA: No Plavix: Patient will contact surgeon today for instructions  Covid test 07/01/21.  Instructions, map and requisition given to patient.  Anesthesia Review: Yes. Cardiac histroy.  Seen 06/17/21 for chest pain in office.  Needs stress test for cardiac clearance pe CE note.  Patient states has stress test scheduled for 06/23/21 at [facility].  Per Karel Jarvis, PA results needed prior to surgery.  Patient denies shortness of breath, fever, cough, and chest pain at PAT appointment.  Patient verbalized understanding of instructions provided today at the PAT appointment.  Patient asked to review instructions at home and day of surgery.

## 2021-06-22 NOTE — Pre-Procedure Instructions (Signed)
Surgical Instructions    Your procedure is scheduled on Tuesday, September 6th.  Report to [facility] Main Entrance "A" at 5:30 A.M., then check in with the Admitting office.  Call this number if you have problems the morning of surgery:  [phone]   If you have any questions prior to your surgery date call [phone]: Open Monday-Friday 8am-4pm    Remember:  Do not eat after midnight the night before your surgery  You may drink clear liquids until 4:30 a.m. the morning of your surgery.   Clear liquids allowed are: Water, Non-Citrus Juices (without pulp), Carbonated Beverages, Clear Tea, Black Coffee Only, and Gatorade.  Please complete your 10 oz bottle of water that was provided to you by 4:30 a.m. the morning of surgery.  Please, if able, drink it in one setting.    Take these medicines the morning of surgery with A SIP OF WATER  buPROPion (WELLBUTRIN XL)  famotidine (PEPCID) gabapentin (NEURONTIN)  hydrOXYzine (VISTARIL)  levothyroxine (SYNTHROID, LEVOTHROID) metoCLOPramide (REGLAN)  metoprolol succinate (TOPROL-XL) montelukast (SINGULAIR)  pantoprazole (PROTONIX) rosuvastatin (CRESTOR)  topiramate (TOPAMAX)  Albuterol inhaler-please bring with you on the day of surgery baclofen (LIORESAL) budesonide-formoterol (SYMBICORT) diphenoxylate-atropine (LOMOTIL) fluticasone (FLONASE) HYDROcodone-acetaminophen (NORCO)  loratadine (CLARITIN) 10  ondansetron (ZOFRAN-ODT) promethazine (PHENERGAN)  Rimegepant Sulfate (NURTEC) Ubrogepant (UBRELVY)  valACYclovir (VALTREX)   Follow your surgeon's instructions on when to stop clopidogrel (PLAVIX).  If no instructions were given by your surgeon then you will need to call the office to get those instructions.    As of today, STOP taking any Aspirin (unless otherwise instructed by your surgeon) Aleve, Naproxen, Ibuprofen, Motrin, Advil, Goody's, BC's, all herbal medications, fish oil, and all vitamins. This includes: diclofenac  Sodium (VOLTAREN) 1 % GEL.    WHAT DO I DO ABOUT MY DIABETES MEDICATION?  The day of surgery, do not take other diabetes injectables, OZEMPIC.   Patients with Insulin Pumps    For patients with Insulin Pumps: Contact your diabetes doctor for specific instructions before surgery. Decrease basal insulin rates by 20% at midnight the night before surgery. Do not remove your insulin pump prior to arrival to short stay the morning of surgery.  Anesthesia will instruct you on when to remove your pump. Note that if your surgery is planned to be longer than 2 hours, your insulin pump will be removed and intravenous (IV) insulin will be started and managed by the nurses and anesthesiologist. You will be able to restart your insulin pump once you are awake and able to manage it. Make sure to bring insulin pump supplies to the hospital with you in case your site needs to be changed.    HOW TO MANAGE YOUR DIABETES BEFORE AND AFTER SURGERY  Why is it important to control my blood sugar before and after surgery? Improving blood sugar levels before and after surgery helps healing and can limit problems. A way of improving blood sugar control is eating a healthy diet by:  Eating less sugar and carbohydrates  Increasing activity/exercise  Talking with your doctor about reaching your blood sugar goals High blood sugars (greater than 180 mg/dL) can raise your risk of infections and slow your recovery, so you will need to focus on controlling your diabetes during the weeks before surgery. Make sure that the doctor who takes care of your diabetes knows about your planned surgery including the date and location.  How do I manage my blood sugar before surgery? Check your blood sugar at least 4 times  a day, starting 2 days before surgery, to make sure that the level is not too high or low.  Check your blood sugar the morning of your surgery when you wake up and every 2 hours until you get to the Short Stay  unit.  If your blood sugar is less than 70 mg/dL, you will need to treat for low blood sugar: Do not take insulin. Treat a low blood sugar (less than 70 mg/dL) with  cup of clear juice (cranberry or apple), 4 glucose tablets, OR glucose gel. Recheck blood sugar in 15 minutes after treatment (to make sure it is greater than 70 mg/dL). If your blood sugar is not greater than 70 mg/dL on recheck, call [phone] for further instructions. Report your blood sugar to the short stay nurse when you get to Short Stay.  If you are admitted to the hospital after surgery: Your blood sugar will be checked by the staff and you will probably be given insulin after surgery (instead of oral diabetes medicines) to make sure you have good blood sugar levels. The goal for blood sugar control after surgery is 80-180 mg/dL.             Do NOT Smoke (Tobacco/Vaping) or drink Alcohol 24 hours prior to your procedure.  If you use a CPAP at night, you may bring all equipment for your overnight stay.   Contacts, glasses, piercing's, hearing aid's, dentures or partials may not be worn into surgery, please bring cases for these belongings.    For patients admitted to the hospital, discharge time will be determined by your treatment team.   Patients discharged the day of surgery will not be allowed to drive home, and someone needs to stay with them for 24 hours.  ONLY 1 SUPPORT PERSON MAY BE PRESENT WHILE YOU ARE IN SURGERY. IF YOU ARE TO BE ADMITTED ONCE YOU ARE IN YOUR ROOM YOU WILL BE ALLOWED TWO (2) VISITORS.  Minor children may have two parents present. Special consideration for safety and communication needs will be reviewed on a case by case basis.   Special instructions:   [facility] [place]- Preparing For Surgery  Before surgery, you can play an important role. Because skin is not sterile, your skin needs to be as free of germs as possible. You can reduce the number of germs on your skin by washing with CHG  (chlorahexidine gluconate) Soap before surgery.  CHG is an antiseptic cleaner which kills germs and bonds with the skin to continue killing germs even after washing.    Oral Hygiene is also important to reduce your risk of infection.  Remember - BRUSH YOUR TEETH THE MORNING OF SURGERY WITH YOUR REGULAR TOOTHPASTE  Please do not use if you have an allergy to CHG or antibacterial soaps. If your skin becomes reddened/irritated stop using the CHG.  Do not shave (including legs and underarms) for at least 48 hours prior to first CHG shower. It is OK to shave your face.  Please follow these instructions carefully.   Shower the NIGHT BEFORE SURGERY and the MORNING OF SURGERY  If you chose to wash your hair, wash your hair first as usual with your normal shampoo.  After you shampoo, rinse your hair and body thoroughly to remove the shampoo.  Use CHG Soap as you would any other liquid soap. You can apply CHG directly to the skin and wash gently with a scrungie or a clean washcloth.   Apply the CHG Soap to your body  ONLY FROM THE NECK DOWN.  Do not use on open wounds or open sores. Avoid contact with your eyes, ears, mouth and genitals (private parts). Wash Face and genitals (private parts)  with your normal soap.   Wash thoroughly, paying special attention to the area where your surgery will be performed.  Thoroughly rinse your body with warm water from the neck down.  DO NOT shower/wash with your normal soap after using and rinsing off the CHG Soap.  Pat yourself dry with a CLEAN TOWEL.  Wear CLEAN PAJAMAS to bed the night before surgery  Place CLEAN SHEETS on your bed the night before your surgery  DO NOT SLEEP WITH PETS.   Day of Surgery: Shower with CHG soap. Do not wear jewelry, make up, nail polish, gel polish, artificial nails, or any other type of covering on natural nails including finger and toenails. If patients have artificial nails, gel coating, etc. that need to be removed  by a nail salon please have this removed prior to surgery. Surgery may need to be canceled/delayed if the surgeon/ anesthesia feels like the patient is unable to be adequately monitored. Do not wear lotions, powders, perfumes, or deodorant. Do not shave 48 hours prior to surgery.   Do not bring valuables to the hospital. [facility] is not responsible for any belongings or valuables. Wear Clean/Comfortable clothing the morning of surgery Remember to brush your teeth WITH YOUR REGULAR TOOTHPASTE.   Please read over the following fact sheets that you were given.

## 2021-06-22 NOTE — Telephone Encounter (Signed)
Contacted patient and made her aware that she would need to reach out to the provider that is managing her blood thinner to see when she would need to discontinue and how long for. Patient understood and had no further questions or concerns.

## 2021-06-23 LAB — URINE CULTURE: Culture: <10000 — AB

## 2021-06-29 NOTE — Progress Notes (Signed)
Anesthesia Chart Review:  Follows with neurology for history of CVA 2017, OSA on BiPAP, headaches.  She is maintained on Plavix for secondary stroke prevention.  He was cleared by neurology to hold Plavix for 7 days for upcoming surgery.  Patient reported last dose Plavix 06/29/2021.  Follows with cardiology for history of HTN, PVCs, chest pain.  She was last seen 06/17/2021 and reported atypical chest pain.  Stress test was ordered to rule out ischemia.  Note states that if the stress test is normal she will be okay to proceed with surgery from a cardiac standpoint.  Stress test was done 06/23/21 and was nonischemic, ef 58%. Full results below.  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  Last A1c 5.8 on 05/30/2021 per office visit note same date.  Preop labs reviewed, mild hypokalemia with potassium 3.3, otherwise unremarkable.  EKG 06/17/2021 (copy on chart): Sinus rhythm.  Rate 81.  Nuclear stress 06/23/2021 (copy on chart): Impressions: 1.  Negative pharmacologic stress test for the detection of ischemia, but abnormal due to diaphragmatic attenuation.  2.  Normal SPECT perfusion imaging with artifact: Diaphragmatic attenuation. 3.  No significant ischemia detected. 4.  Normal left ventricular systolic function. 5.  The calculated ejection fraction of 58%.  TTE 02/11/21 (Care Everywhere): SUMMARY  [place] SPS 04/28/20.  Image Quality: Technically difficult.  The left ventricular size is normal.  There is normal left ventricular wall thickness.  Left ventricular systolic function is normal.  LV ejection fraction = 60-65%.  Left ventricular filling pattern is indeterminate.  The left ventricular wall motion is normal.   Exercise stress test 01/08/2019 (Care Everywhere): Procedure:  The patient exercised for 4 minutes on the 2 minute Bruce protocol, achieving 6.9 METS.    The peak heart rate was 169 bpm, achieving 96 % of MPHR    The resting blood pressure was  136/98 mmHg and peak blood pressure was 210/100 mmHg.    Reason for termination: Target heart rate completed  The heart rate response normal  The blood pressure response was abnormal  There was no chest pain  ST Changes: There were no significant ST-T wave changes  Arrhythmias: None   Conclusion:   Normal exercise treadmill stress test     Wynonia Musty [facility]/Anesthesiology Phone [phone] 07/01/2021 3:51 PM

## 2021-07-01 ENCOUNTER — Other Ambulatory Visit: Payer: Self-pay | Admitting: Orthopedic Surgery

## 2021-07-01 MED ORDER — TRANEXAMIC ACID 1000 MG/10ML IV SOLN
2000.0000 mg | INTRAVENOUS | Status: DC
  Filled 2021-07-01: qty 20

## 2021-07-01 NOTE — Anesthesia Preprocedure Evaluation (Addendum)
Anesthesia Evaluation  Patient identified by MRN, date of birth, ID band Patient awake    Reviewed: Allergy & Precautions, NPO status   Airway Mallampati: III  TM Distance: >3 FB Neck ROM: Full    Dental no notable dental hx.    Pulmonary asthma , sleep apnea , Current Smoker and Patient abstained from smoking.,    Pulmonary exam normal breath sounds clear to auscultation       Cardiovascular hypertension, Normal cardiovascular exam+ dysrhythmias  Rhythm:Regular Rate:Normal     Neuro/Psych  Headaches, PSYCHIATRIC DISORDERS Anxiety Depression TIA Neuromuscular disease CVA    GI/Hepatic GERD  ,  Endo/Other  diabetes, Type 2Hypothyroidism Morbid obesity  Renal/GU   negative genitourinary   Musculoskeletal  (+) Arthritis ,   Abdominal (+) + obese,   Peds  Hematology   Anesthesia Other Findings   Reproductive/Obstetrics negative OB ROS                           Anesthesia Physical Anesthesia Plan  ASA: 3  Anesthesia Plan: Regional and Spinal   Post-op Pain Management:    Induction: Intravenous  PONV Risk Score and Plan: 2 and Scopolamine patch - Pre-op, Treatment may vary due to age or medical condition, Propofol infusion, TIVA, Midazolam, Ondansetron and Dexamethasone  Airway Management Planned: Natural Airway  Additional Equipment: None  Intra-op Plan:   Post-operative Plan: Extubation in OR  Informed Consent: I have reviewed the patients History and Physical, chart, labs and discussed the procedure including the risks, benefits and alternatives for the proposed anesthesia with the patient or authorized representative who has indicated his/her understanding and acceptance.     Dental advisory given  Plan Discussed with: Anesthesiologist and CRNA  Anesthesia Plan Comments: (Adductor canal block. Spinal. GETA as backup plan. Norton Blizzard, MD     PAT note by Karoline Caldwell,  PA-C: Follows with neurology for history of CVA 2017, OSA on BiPAP, headaches.  She is maintained on Plavix for secondary stroke prevention.  He was cleared by neurology to hold Plavix for 7 days for upcoming surgery.  Patient reported last dose Plavix 06/29/2021.  Follows with cardiology for history of HTN, PVCs, chest pain.  She was last seen 06/17/2021 and reported atypical chest pain.  Stress test was ordered to rule out ischemia.  Note states that if the stress test is normal she will be okay to proceed with surgery from a cardiac standpoint.  Stress test was done 06/23/21 and was nonischemic, ef 58%. Full results below.  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  Last A1c 5.8 on 05/30/2021 per office visit note same date.  Preop labs reviewed, mild hypokalemia with potassium 3.3, otherwise unremarkable.  EKG 06/17/2021 (copy on chart): Sinus rhythm.  Rate 81.  Nuclear stress 06/23/2021 (copy on chart): Impressions: 1.  Negative pharmacologic stress test for the detection of ischemia, but abnormal due to diaphragmatic attenuation.  2.  Normal SPECT perfusion imaging with artifact: Diaphragmatic attenuation. 3.  No significant ischemia detected. 4.  Normal left ventricular systolic function. 5.  The calculated ejection fraction of 58%.  TTE 02/11/21 (Care Everywhere): SUMMARY  [place] SPS 04/28/20.  Image Quality: Technically difficult.  The left ventricular size is normal.  There is normal left ventricular wall thickness.  Left ventricular systolic function is normal.  LV ejection fraction = 60-65%.  Left ventricular filling pattern is indeterminate.  The left ventricular wall motion is  normal.   Exercise stress test 01/08/2019 (Care Everywhere): Procedure:  The patient exercised for 4 minutes on the 2 minute Bruce protocol, achieving 6.9 METS.   The peak heart rate was 169 bpm, achieving 96 % of MPHR   The resting blood pressure was 136/98 mmHg and peak  blood pressure was 210/100 mmHg.   Reason for termination: Target heart rate completed  The heart rate response normal  The blood pressure response was abnormal  There was no chest pain  ST Changes: There were no significant ST-T wave changes  Arrhythmias: None   Conclusion:   Normal exercise treadmill stress test   )      Anesthesia Quick Evaluation

## 2021-07-02 LAB — SARS CORONAVIRUS 2 (TAT 6-24 HRS): SARS Coronavirus 2: NEGATIVE

## 2021-07-05 ENCOUNTER — Encounter (HOSPITAL_COMMUNITY)
Admission: RE | Disposition: A | Discharge status: Home / Self Care | Payer: Self-pay | Source: Home / Self Care | Attending: Orthopedic Surgery

## 2021-07-05 ENCOUNTER — Encounter (HOSPITAL_COMMUNITY): Payer: Self-pay | Admitting: Orthopedic Surgery

## 2021-07-05 ENCOUNTER — Ambulatory Visit (HOSPITAL_COMMUNITY): Payer: BC Managed Care – PPO | Admitting: Anesthesiology

## 2021-07-05 ENCOUNTER — Observation Stay (HOSPITAL_COMMUNITY)
Admission: RE | Admit: 2021-07-05 | Discharge: 2021-07-07 | Disposition: A | Discharge status: Home / Self Care | Payer: BC Managed Care – PPO | Attending: Orthopedic Surgery | Admitting: Orthopedic Surgery

## 2021-07-05 ENCOUNTER — Ambulatory Visit (HOSPITAL_COMMUNITY): Payer: BC Managed Care – PPO | Admitting: Physician Assistant

## 2021-07-05 DIAGNOSIS — E119 Type 2 diabetes mellitus without complications: Secondary | ICD-10-CM | POA: Insufficient documentation

## 2021-07-05 DIAGNOSIS — J45909 Unspecified asthma, uncomplicated: Secondary | ICD-10-CM | POA: Insufficient documentation

## 2021-07-05 DIAGNOSIS — M1711 Unilateral primary osteoarthritis, right knee: Principal | ICD-10-CM | POA: Insufficient documentation

## 2021-07-05 DIAGNOSIS — E039 Hypothyroidism, unspecified: Secondary | ICD-10-CM | POA: Insufficient documentation

## 2021-07-05 DIAGNOSIS — F17210 Nicotine dependence, cigarettes, uncomplicated: Secondary | ICD-10-CM | POA: Diagnosis not present

## 2021-07-05 DIAGNOSIS — I10 Essential (primary) hypertension: Secondary | ICD-10-CM | POA: Diagnosis not present

## 2021-07-05 DIAGNOSIS — Z8673 Personal history of transient ischemic attack (TIA), and cerebral infarction without residual deficits: Secondary | ICD-10-CM | POA: Insufficient documentation

## 2021-07-05 DIAGNOSIS — Z96651 Presence of right artificial knee joint: Secondary | ICD-10-CM

## 2021-07-05 HISTORY — PX: TOTAL KNEE ARTHROPLASTY: SHX125

## 2021-07-05 LAB — POCT PREGNANCY, URINE: Preg Test, Ur: NEGATIVE

## 2021-07-05 LAB — HEMOGLOBIN A1C
Hgb A1c MFr Bld: 5.7 % — ABNORMAL HIGH (ref 4.8–5.6)
Mean Plasma Glucose: 116.89 mg/dL

## 2021-07-05 SURGERY — ARTHROPLASTY, KNEE, TOTAL
Anesthesia: Regional | Site: Knee | Laterality: Right

## 2021-07-05 MED ORDER — BUPIVACAINE HCL 0.25 % IJ SOLN
INTRAMUSCULAR | Status: DC | PRN
  Administered 2021-07-05: 20 mL
  Administered 2021-07-05: 10 mL

## 2021-07-05 MED ORDER — ONDANSETRON HCL 4 MG/2ML IJ SOLN: 4.0000 mg | Freq: Four times a day (QID) | INTRAMUSCULAR | Status: DC | PRN

## 2021-07-05 MED ORDER — CHOLESTYRAMINE 4 G PO PACK
4.0000 g | PACK | Freq: Every day | ORAL | Status: DC
  Administered 2021-07-06 – 2021-07-07 (×2): 4 g via ORAL
  Filled 2021-07-05 (×2): qty 1

## 2021-07-05 MED ORDER — CLONIDINE HCL (ANALGESIA) 100 MCG/ML EP SOLN
EPIDURAL | Status: AC
  Filled 2021-07-05: qty 10

## 2021-07-05 MED ORDER — LIDOCAINE HCL (CARDIAC) PF 100 MG/5ML IV SOSY
PREFILLED_SYRINGE | INTRAVENOUS | Status: DC | PRN
  Administered 2021-07-05: 20 mg via INTRATRACHEAL
  Administered 2021-07-05: 40 mg via INTRATRACHEAL

## 2021-07-05 MED ORDER — METOPROLOL SUCCINATE 12.5 MG HALF TABLET
12.5000 mg | ORAL_TABLET | Freq: Every day | ORAL | Status: DC
  Administered 2021-07-06 – 2021-07-07 (×2): 12.5 mg via ORAL
  Filled 2021-07-05 (×3): qty 1

## 2021-07-05 MED ORDER — BUPIVACAINE HCL (PF) 0.5 % IJ SOLN
INTRAMUSCULAR | Status: DC | PRN
  Administered 2021-07-05: 30 mL via PERINEURAL

## 2021-07-05 MED ORDER — HYDROXYZINE HCL 25 MG PO TABS
50.0000 mg | ORAL_TABLET | Freq: Three times a day (TID) | ORAL | Status: DC
  Administered 2021-07-05 – 2021-07-07 (×6): 50 mg via ORAL
  Filled 2021-07-05 (×6): qty 2

## 2021-07-05 MED ORDER — LORATADINE 10 MG PO TABS: 10.0000 mg | ORAL_TABLET | Freq: Every day | ORAL | Status: DC | PRN

## 2021-07-05 MED ORDER — MORPHINE SULFATE (PF) 4 MG/ML IV SOLN
INTRAVENOUS | Status: AC
  Filled 2021-07-05: qty 1

## 2021-07-05 MED ORDER — VANCOMYCIN HCL 1000 MG IV SOLR
INTRAVENOUS | Status: AC
  Filled 2021-07-05: qty 20

## 2021-07-05 MED ORDER — MOMETASONE FURO-FORMOTEROL FUM 200-5 MCG/ACT IN AERO
2.0000 | INHALATION_SPRAY | Freq: Two times a day (BID) | RESPIRATORY_TRACT | Status: DC
  Administered 2021-07-06: 2 via RESPIRATORY_TRACT
  Filled 2021-07-05: qty 8.8

## 2021-07-05 MED ORDER — PROPOFOL 10 MG/ML IV BOLUS
INTRAVENOUS | Status: DC | PRN
  Administered 2021-07-05: 50 mg via INTRAVENOUS
  Administered 2021-07-05 (×2): 20 mg via INTRAVENOUS

## 2021-07-05 MED ORDER — POVIDONE-IODINE 10 % EX SWAB: 2.0000 "application " | Freq: Once | CUTANEOUS | Status: DC

## 2021-07-05 MED ORDER — METOCLOPRAMIDE HCL 5 MG PO TABS
2.5000 mg | ORAL_TABLET | Freq: Three times a day (TID) | ORAL | Status: DC
  Administered 2021-07-05 – 2021-07-07 (×9): 2.5 mg via ORAL
  Filled 2021-07-05 (×9): qty 1

## 2021-07-05 MED ORDER — FENTANYL CITRATE (PF) 100 MCG/2ML IJ SOLN
INTRAMUSCULAR | Status: AC
  Filled 2021-07-05: qty 2

## 2021-07-05 MED ORDER — PROPOFOL 500 MG/50ML IV EMUL
INTRAVENOUS | Status: DC | PRN
  Administered 2021-07-05: 50 ug/kg/min via INTRAVENOUS

## 2021-07-05 MED ORDER — MENTHOL 3 MG MT LOZG: 1.0000 | LOZENGE | OROMUCOSAL | Status: DC | PRN

## 2021-07-05 MED ORDER — FENTANYL CITRATE (PF) 100 MCG/2ML IJ SOLN
25.0000 ug | INTRAMUSCULAR | Status: DC | PRN
  Administered 2021-07-05: 50 ug via INTRAVENOUS

## 2021-07-05 MED ORDER — AMISULPRIDE (ANTIEMETIC) 5 MG/2ML IV SOLN: 10.0000 mg | Freq: Once | INTRAVENOUS | Status: DC | PRN

## 2021-07-05 MED ORDER — LACTATED RINGERS IV SOLN: INTRAVENOUS | Status: DC

## 2021-07-05 MED ORDER — METOCLOPRAMIDE HCL 5 MG PO TABS: 5.0000 mg | ORAL_TABLET | Freq: Three times a day (TID) | ORAL | Status: DC | PRN

## 2021-07-05 MED ORDER — LEVOTHYROXINE SODIUM 125 MCG PO TABS
125.0000 ug | ORAL_TABLET | Freq: Every day | ORAL | Status: DC
Start: 2021-07-06 — End: 2021-07-07
  Administered 2021-07-06 – 2021-07-07 (×2): 125 ug via ORAL
  Filled 2021-07-05 (×4): qty 1

## 2021-07-05 MED ORDER — BUPIVACAINE LIPOSOME 1.3 % IJ SUSP
INTRAMUSCULAR | Status: DC | PRN
  Administered 2021-07-05: 20 mL

## 2021-07-05 MED ORDER — PHENOL 1.4 % MT LIQD: 1.0000 | OROMUCOSAL | Status: DC | PRN

## 2021-07-05 MED ORDER — CEFAZOLIN SODIUM-DEXTROSE 2-4 GM/100ML-% IV SOLN
2.0000 g | Freq: Three times a day (TID) | INTRAVENOUS | Status: AC
  Administered 2021-07-05 (×2): 2 g via INTRAVENOUS
  Filled 2021-07-05 (×2): qty 100

## 2021-07-05 MED ORDER — GLYCOPYRROLATE PF 0.2 MG/ML IJ SOSY
PREFILLED_SYRINGE | INTRAMUSCULAR | Status: DC | PRN
  Administered 2021-07-05: .2 mg via INTRAVENOUS

## 2021-07-05 MED ORDER — MIDAZOLAM HCL 5 MG/5ML IJ SOLN
INTRAMUSCULAR | Status: DC | PRN
Start: 2021-07-05 — End: 2021-07-05
  Administered 2021-07-05: 2 mg via INTRAVENOUS

## 2021-07-05 MED ORDER — LISINOPRIL 10 MG PO TABS
10.0000 mg | ORAL_TABLET | Freq: Every day | ORAL | Status: DC
  Administered 2021-07-06 – 2021-07-07 (×2): 10 mg via ORAL
  Filled 2021-07-05 (×2): qty 1

## 2021-07-05 MED ORDER — CHLORHEXIDINE GLUCONATE 0.12 % MT SOLN
15.0000 mL | OROMUCOSAL | Status: AC
  Administered 2021-07-05: 15 mL via OROMUCOSAL
  Filled 2021-07-05 (×2): qty 15

## 2021-07-05 MED ORDER — ACETAMINOPHEN 500 MG PO TABS
1000.0000 mg | ORAL_TABLET | Freq: Once | ORAL | Status: AC
  Administered 2021-07-05: 1000 mg via ORAL
  Filled 2021-07-05: qty 2

## 2021-07-05 MED ORDER — DOCUSATE SODIUM 100 MG PO CAPS
100.0000 mg | ORAL_CAPSULE | Freq: Two times a day (BID) | ORAL | Status: DC
  Administered 2021-07-05 – 2021-07-07 (×4): 100 mg via ORAL
  Filled 2021-07-05 (×3): qty 1

## 2021-07-05 MED ORDER — PHENYLEPHRINE 40 MCG/ML (10ML) SYRINGE FOR IV PUSH (FOR BLOOD PRESSURE SUPPORT)
PREFILLED_SYRINGE | INTRAVENOUS | Status: AC
  Filled 2021-07-05: qty 10

## 2021-07-05 MED ORDER — ACETAMINOPHEN 500 MG PO TABS
1000.0000 mg | ORAL_TABLET | Freq: Four times a day (QID) | ORAL | Status: AC
  Administered 2021-07-05 – 2021-07-06 (×3): 1000 mg via ORAL
  Filled 2021-07-05 (×3): qty 2

## 2021-07-05 MED ORDER — ACETAMINOPHEN 325 MG PO TABS
325.0000 mg | ORAL_TABLET | Freq: Four times a day (QID) | ORAL | Status: DC | PRN
  Administered 2021-07-07: 650 mg via ORAL
  Filled 2021-07-05: qty 2

## 2021-07-05 MED ORDER — PROMETHAZINE HCL 25 MG/ML IJ SOLN: 6.2500 mg | INTRAMUSCULAR | Status: DC | PRN

## 2021-07-05 MED ORDER — VANCOMYCIN HCL 1000 MG IV SOLR
INTRAVENOUS | Status: DC | PRN
  Administered 2021-07-05: 1000 mg

## 2021-07-05 MED ORDER — BUPROPION HCL ER (XL) 150 MG PO TB24: 150.0000 mg | ORAL_TABLET | ORAL | Status: DC

## 2021-07-05 MED ORDER — INSULIN ASPART 100 UNIT/ML IJ SOLN
0.0000 [IU] | Freq: Three times a day (TID) | INTRAMUSCULAR | Status: DC
  Administered 2021-07-05: 4.7 [IU] via SUBCUTANEOUS

## 2021-07-05 MED ORDER — METOCLOPRAMIDE HCL 5 MG/ML IJ SOLN: 5.0000 mg | Freq: Three times a day (TID) | INTRAMUSCULAR | Status: DC | PRN

## 2021-07-05 MED ORDER — GABAPENTIN 300 MG PO CAPS: 300.0000 mg | ORAL_CAPSULE | Freq: Three times a day (TID) | ORAL | Status: DC

## 2021-07-05 MED ORDER — FAMOTIDINE 20 MG PO TABS
40.0000 mg | ORAL_TABLET | Freq: Two times a day (BID) | ORAL | Status: DC
  Administered 2021-07-05 – 2021-07-07 (×4): 40 mg via ORAL
  Filled 2021-07-05 (×4): qty 2

## 2021-07-05 MED ORDER — BUPROPION HCL ER (XL) 150 MG PO TB24
300.0000 mg | ORAL_TABLET | Freq: Every day | ORAL | Status: DC
  Administered 2021-07-06 – 2021-07-07 (×2): 300 mg via ORAL
  Filled 2021-07-05 (×2): qty 2

## 2021-07-05 MED ORDER — TRANEXAMIC ACID 1000 MG/10ML IV SOLN
INTRAVENOUS | Status: DC | PRN
  Administered 2021-07-05: 2000 mg via TOPICAL

## 2021-07-05 MED ORDER — ONDANSETRON HCL 4 MG PO TABS: 4.0000 mg | ORAL_TABLET | Freq: Four times a day (QID) | ORAL | Status: DC | PRN

## 2021-07-05 MED ORDER — BUPIVACAINE IN DEXTROSE 0.75-8.25 % IT SOLN
INTRATHECAL | Status: DC | PRN
  Administered 2021-07-05: 1.8 mL via INTRATHECAL

## 2021-07-05 MED ORDER — TRAZODONE HCL 150 MG PO TABS
150.0000 mg | ORAL_TABLET | Freq: Every day | ORAL | Status: DC
  Administered 2021-07-05 – 2021-07-06 (×2): 150 mg via ORAL
  Filled 2021-07-05 (×3): qty 1

## 2021-07-05 MED ORDER — POVIDONE-IODINE 7.5 % EX SOLN: Freq: Once | CUTANEOUS | Status: DC

## 2021-07-05 MED ORDER — ONDANSETRON HCL 4 MG/2ML IJ SOLN
INTRAMUSCULAR | Status: AC
  Filled 2021-07-05: qty 2

## 2021-07-05 MED ORDER — ALBUTEROL SULFATE (2.5 MG/3ML) 0.083% IN NEBU: 3.0000 mL | INHALATION_SOLUTION | RESPIRATORY_TRACT | Status: DC | PRN

## 2021-07-05 MED ORDER — SODIUM CHLORIDE 0.9 % IR SOLN
Status: DC | PRN
  Administered 2021-07-05: 3000 mL

## 2021-07-05 MED ORDER — BUPIVACAINE LIPOSOME 1.3 % IJ SUSP
INTRAMUSCULAR | Status: AC
  Filled 2021-07-05: qty 20

## 2021-07-05 MED ORDER — PANTOPRAZOLE SODIUM 40 MG PO TBEC
40.0000 mg | DELAYED_RELEASE_TABLET | Freq: Two times a day (BID) | ORAL | Status: DC
  Administered 2021-07-06 – 2021-07-07 (×3): 40 mg via ORAL
  Filled 2021-07-05: qty 1
  Filled 2021-07-05: qty 2
  Filled 2021-07-05: qty 1

## 2021-07-05 MED ORDER — IRRISEPT - 450ML BOTTLE WITH 0.05% CHG IN STERILE WATER, USP 99.95% OPTIME
TOPICAL | Status: DC | PRN
  Administered 2021-07-05: 450 mL via TOPICAL

## 2021-07-05 MED ORDER — FENTANYL CITRATE (PF) 100 MCG/2ML IJ SOLN
INTRAMUSCULAR | Status: DC | PRN
  Administered 2021-07-05: 50 ug via INTRAVENOUS

## 2021-07-05 MED ORDER — 0.9 % SODIUM CHLORIDE (POUR BTL) OPTIME
TOPICAL | Status: DC | PRN
  Administered 2021-07-05 (×5): 1000 mL

## 2021-07-05 MED ORDER — SODIUM CHLORIDE 0.9% FLUSH
INTRAVENOUS | Status: DC | PRN
  Administered 2021-07-05: 20 mL via INTRAVENOUS

## 2021-07-05 MED ORDER — GLYCOPYRROLATE PF 0.2 MG/ML IJ SOSY
PREFILLED_SYRINGE | INTRAMUSCULAR | Status: AC
  Filled 2021-07-05: qty 1

## 2021-07-05 MED ORDER — ARIPIPRAZOLE 10 MG PO TABS
30.0000 mg | ORAL_TABLET | Freq: Every evening | ORAL | Status: DC
  Administered 2021-07-05 – 2021-07-06 (×2): 30 mg via ORAL
  Filled 2021-07-05 (×2): qty 3

## 2021-07-05 MED ORDER — TRANEXAMIC ACID-NACL 1000-0.7 MG/100ML-% IV SOLN
1000.0000 mg | INTRAVENOUS | Status: AC
Start: 2021-07-05 — End: 2021-07-05
  Administered 2021-07-05: 1000 mg via INTRAVENOUS
  Filled 2021-07-05: qty 100

## 2021-07-05 MED ORDER — PROPOFOL 1000 MG/100ML IV EMUL
INTRAVENOUS | Status: AC
  Filled 2021-07-05: qty 100

## 2021-07-05 MED ORDER — PROPOFOL 10 MG/ML IV BOLUS
INTRAVENOUS | Status: AC
  Filled 2021-07-05: qty 20

## 2021-07-05 MED ORDER — BUPROPION HCL ER (XL) 150 MG PO TB24
150.0000 mg | ORAL_TABLET | Freq: Every day | ORAL | Status: DC
  Administered 2021-07-05 – 2021-07-06 (×2): 150 mg via ORAL
  Filled 2021-07-05 (×2): qty 1

## 2021-07-05 MED ORDER — CELECOXIB 200 MG PO CAPS
200.0000 mg | ORAL_CAPSULE | Freq: Two times a day (BID) | ORAL | Status: DC
  Administered 2021-07-05 – 2021-07-07 (×5): 200 mg via ORAL
  Filled 2021-07-05 (×5): qty 1

## 2021-07-05 MED ORDER — BUPIVACAINE HCL (PF) 0.25 % IJ SOLN
INTRAMUSCULAR | Status: AC
  Filled 2021-07-05: qty 30

## 2021-07-05 MED ORDER — INSULIN ASPART 100 UNIT/ML IJ SOLN: 0.0000 [IU] | Freq: Every day | INTRAMUSCULAR | Status: DC

## 2021-07-05 MED ORDER — METHOCARBAMOL 500 MG PO TABS
500.0000 mg | ORAL_TABLET | Freq: Four times a day (QID) | ORAL | Status: DC | PRN
  Administered 2021-07-06 – 2021-07-07 (×4): 500 mg via ORAL
  Filled 2021-07-05 (×4): qty 1

## 2021-07-05 MED ORDER — MORPHINE SULFATE (PF) 4 MG/ML IV SOLN
INTRAVENOUS | Status: DC | PRN
  Administered 2021-07-05: 8 mg via INTRAVENOUS

## 2021-07-05 MED ORDER — GABAPENTIN 400 MG PO CAPS
400.0000 mg | ORAL_CAPSULE | Freq: Three times a day (TID) | ORAL | Status: DC
  Administered 2021-07-05 – 2021-07-07 (×6): 400 mg via ORAL
  Filled 2021-07-05 (×6): qty 1

## 2021-07-05 MED ORDER — ONDANSETRON HCL 4 MG/2ML IJ SOLN
INTRAMUSCULAR | Status: DC | PRN
  Administered 2021-07-05: 4 mg via INTRAVENOUS

## 2021-07-05 MED ORDER — TOPIRAMATE 100 MG PO TABS
100.0000 mg | ORAL_TABLET | Freq: Two times a day (BID) | ORAL | Status: DC
  Administered 2021-07-05 – 2021-07-07 (×4): 100 mg via ORAL
  Filled 2021-07-05 (×5): qty 1

## 2021-07-05 MED ORDER — TRANEXAMIC ACID-NACL 1000-0.7 MG/100ML-% IV SOLN
INTRAVENOUS | Status: AC
  Filled 2021-07-05: qty 100

## 2021-07-05 MED ORDER — PRAZOSIN HCL 2 MG PO CAPS
10.0000 mg | ORAL_CAPSULE | Freq: Every day | ORAL | Status: DC
  Administered 2021-07-05 – 2021-07-06 (×2): 10 mg via ORAL
  Filled 2021-07-05 (×3): qty 5

## 2021-07-05 MED ORDER — MIDAZOLAM HCL 2 MG/2ML IJ SOLN
INTRAMUSCULAR | Status: AC
  Filled 2021-07-05: qty 2

## 2021-07-05 MED ORDER — METHOCARBAMOL 1000 MG/10ML IJ SOLN
500.0000 mg | Freq: Four times a day (QID) | INTRAVENOUS | Status: DC | PRN
  Filled 2021-07-05: qty 5

## 2021-07-05 MED ORDER — CLOPIDOGREL BISULFATE 75 MG PO TABS
75.0000 mg | ORAL_TABLET | Freq: Every day | ORAL | Status: DC
  Administered 2021-07-05 – 2021-07-07 (×3): 75 mg via ORAL
  Filled 2021-07-05 (×2): qty 1

## 2021-07-05 MED ORDER — CLONIDINE HCL (ANALGESIA) 100 MCG/ML EP SOLN
EPIDURAL | Status: DC | PRN
  Administered 2021-07-05: 1 mL

## 2021-07-05 MED ORDER — ALBUMIN HUMAN 5 % IV SOLN: INTRAVENOUS | Status: DC | PRN

## 2021-07-05 MED ORDER — OXYCODONE HCL 5 MG PO TABS
5.0000 mg | ORAL_TABLET | ORAL | Status: DC | PRN
  Administered 2021-07-05: 5 mg via ORAL
  Administered 2021-07-06 – 2021-07-07 (×4): 10 mg via ORAL
  Filled 2021-07-05 (×3): qty 2
  Filled 2021-07-05: qty 1
  Filled 2021-07-05: qty 2

## 2021-07-05 MED ORDER — HYDROMORPHONE HCL 1 MG/ML IJ SOLN
0.5000 mg | INTRAMUSCULAR | Status: DC | PRN
  Administered 2021-07-05 – 2021-07-07 (×6): 0.5 mg via INTRAVENOUS
  Filled 2021-07-05 (×6): qty 0.5

## 2021-07-05 MED ORDER — CEFAZOLIN SODIUM-DEXTROSE 2-4 GM/100ML-% IV SOLN
2.0000 g | INTRAVENOUS | Status: AC
  Administered 2021-07-05: 2 g via INTRAVENOUS
  Filled 2021-07-05: qty 100

## 2021-07-05 MED ORDER — PHENYLEPHRINE 40 MCG/ML (10ML) SYRINGE FOR IV PUSH (FOR BLOOD PRESSURE SUPPORT)
PREFILLED_SYRINGE | INTRAVENOUS | Status: DC | PRN
  Administered 2021-07-05: 80 ug via INTRAVENOUS

## 2021-07-05 MED ORDER — FENTANYL CITRATE (PF) 250 MCG/5ML IJ SOLN
INTRAMUSCULAR | Status: AC
  Filled 2021-07-05: qty 5

## 2021-07-05 SURGICAL SUPPLY — 83 items
BAG COUNTER SPONGE SURGICOUNT (BAG) ×2 IMPLANT
BAG DECANTER FOR FLEXI CONT (MISCELLANEOUS) ×2 IMPLANT
BANDAGE ESMARK 6X9 LF (GAUZE/BANDAGES/DRESSINGS) ×1 IMPLANT
BASEPLATE TIBIAL SZ2 TRI (Joint) ×1 IMPLANT
BLADE SAGITTAL (BLADE) ×2
BLADE SAW SGTL HD 18.5X60.5X1. (BLADE) ×2 IMPLANT
BLADE SAW THK.89X75X18XSGTL (BLADE) ×1 IMPLANT
BNDG COHESIVE 6X5 TAN STRL LF (GAUZE/BANDAGES/DRESSINGS) ×2 IMPLANT
BNDG ELASTIC 4X5.8 VLCR STR LF (GAUZE/BANDAGES/DRESSINGS) ×2 IMPLANT
BNDG ELASTIC 6X10 VLCR STRL LF (GAUZE/BANDAGES/DRESSINGS) ×2 IMPLANT
BNDG ELASTIC 6X15 VLCR STRL LF (GAUZE/BANDAGES/DRESSINGS) ×2 IMPLANT
BNDG ESMARK 6X9 LF (GAUZE/BANDAGES/DRESSINGS) ×2
BOWL SMART MIX CTS (DISPOSABLE) IMPLANT
CNTNR URN SCR LID CUP LEK RST (MISCELLANEOUS) ×1 IMPLANT
COMP FEM CMTLS TRIATH CR 1 RT (Joint) ×2 IMPLANT
COMPONENT FEM CMLS TRTH CR 1RT (Joint) ×1 IMPLANT
CONT SPEC 4OZ STRL OR WHT (MISCELLANEOUS) ×2
COOLER ICEMAN CLASSIC (MISCELLANEOUS) ×2 IMPLANT
COVER SURGICAL LIGHT HANDLE (MISCELLANEOUS) ×2 IMPLANT
CUFF TOURN SGL QUICK 34 (TOURNIQUET CUFF) ×2
CUFF TOURN SGL QUICK 42 (TOURNIQUET CUFF) IMPLANT
CUFF TRNQT CYL 34X4.125X (TOURNIQUET CUFF) ×1 IMPLANT
DECANTER SPIKE VIAL GLASS SM (MISCELLANEOUS) ×2 IMPLANT
DRAPE INCISE IOBAN 66X45 STRL (DRAPES) IMPLANT
DRAPE ORTHO SPLIT 77X108 STRL (DRAPES) ×6
DRAPE SURG ORHT 6 SPLT 77X108 (DRAPES) ×3 IMPLANT
DRAPE U-SHAPE 47X51 STRL (DRAPES) ×2 IMPLANT
DRSG AQUACEL AG ADV 3.5X14 (GAUZE/BANDAGES/DRESSINGS) ×2 IMPLANT
DURAPREP 26ML APPLICATOR (WOUND CARE) ×4 IMPLANT
ELECT CAUTERY BLADE 6.4 (BLADE) ×2 IMPLANT
ELECT REM PT RETURN 9FT ADLT (ELECTROSURGICAL) ×2
ELECTRODE REM PT RTRN 9FT ADLT (ELECTROSURGICAL) ×1 IMPLANT
GAUZE SPONGE 4X4 12PLY STRL (GAUZE/BANDAGES/DRESSINGS) ×2 IMPLANT
GAUZE SPONGE 4X4 12PLY STRL LF (GAUZE/BANDAGES/DRESSINGS) ×2 IMPLANT
GLOVE SRG 8 PF TXTR STRL LF DI (GLOVE) ×1 IMPLANT
GLOVE SURG LTX SZ7 (GLOVE) ×2 IMPLANT
GLOVE SURG LTX SZ8 (GLOVE) ×2 IMPLANT
GLOVE SURG UNDER POLY LF SZ7 (GLOVE) ×2 IMPLANT
GLOVE SURG UNDER POLY LF SZ8 (GLOVE) ×2
GOWN STRL REUS W/ TWL LRG LVL3 (GOWN DISPOSABLE) ×3 IMPLANT
GOWN STRL REUS W/TWL LRG LVL3 (GOWN DISPOSABLE) ×6
HANDPIECE INTERPULSE COAX TIP (DISPOSABLE) ×2
HOOD PEEL AWAY FLYTE STAYCOOL (MISCELLANEOUS) ×6 IMPLANT
IMMOBILIZER KNEE 20 (SOFTGOODS)
IMMOBILIZER KNEE 20 THIGH 36 (SOFTGOODS) IMPLANT
IMMOBILIZER KNEE 22 UNIV (SOFTGOODS) ×2 IMPLANT
IMMOBILIZER KNEE 24 THIGH 36 (MISCELLANEOUS) IMPLANT
IMMOBILIZER KNEE 24 UNIV (MISCELLANEOUS)
INSERT TIB BEAR TRIATH 2X10 (Insert) ×2 IMPLANT
KIT BASIN OR (CUSTOM PROCEDURE TRAY) ×2 IMPLANT
KIT TURNOVER KIT B (KITS) ×2 IMPLANT
KNEE PATELLA ASYMMETRIC 10X32 (Knees) ×2 IMPLANT
MANIFOLD NEPTUNE II (INSTRUMENTS) ×2 IMPLANT
NEEDLE 22X1 1/2 (OR ONLY) (NEEDLE) ×4 IMPLANT
NEEDLE SPNL 18GX3.5 QUINCKE PK (NEEDLE) ×4 IMPLANT
NS IRRIG 1000ML POUR BTL (IV SOLUTION) ×4 IMPLANT
PACK TOTAL JOINT (CUSTOM PROCEDURE TRAY) ×2 IMPLANT
PAD ARMBOARD 7.5X6 YLW CONV (MISCELLANEOUS) ×4 IMPLANT
PAD CAST 4YDX4 CTTN HI CHSV (CAST SUPPLIES) ×1 IMPLANT
PADDING CAST COTTON 4X4 STRL (CAST SUPPLIES) ×2
PADDING CAST COTTON 6X4 STRL (CAST SUPPLIES) ×2 IMPLANT
PIN FLUTED HEDLESS FIX 3.5X1/8 (PIN) ×2 IMPLANT
SET HNDPC FAN SPRY TIP SCT (DISPOSABLE) ×1 IMPLANT
STRIP CLOSURE SKIN 1/2X4 (GAUZE/BANDAGES/DRESSINGS) ×2 IMPLANT
SUCTION FRAZIER HANDLE 10FR (MISCELLANEOUS) ×2
SUCTION TUBE FRAZIER 10FR DISP (MISCELLANEOUS) ×1 IMPLANT
SUT MNCRL AB 3-0 PS2 18 (SUTURE) ×2 IMPLANT
SUT VIC AB 0 CT1 27 (SUTURE) ×6
SUT VIC AB 0 CT1 27XBRD ANBCTR (SUTURE) ×3 IMPLANT
SUT VIC AB 1 CT1 27 (SUTURE) ×10
SUT VIC AB 1 CT1 27XBRD ANBCTR (SUTURE) ×5 IMPLANT
SUT VIC AB 2-0 CT1 27 (SUTURE) ×8
SUT VIC AB 2-0 CT1 TAPERPNT 27 (SUTURE) ×4 IMPLANT
SYR 30ML LL (SYRINGE) ×6 IMPLANT
SYR 30ML SLIP (SYRINGE) ×4 IMPLANT
SYR HYPO PISTON GT 60 (SYRINGE) ×2 IMPLANT
SYR TB 1ML LUER SLIP (SYRINGE) ×2 IMPLANT
TIBIAL BASEPLATE SZ2 TRI (Joint) ×2 IMPLANT
TOWEL GREEN STERILE (TOWEL DISPOSABLE) ×4 IMPLANT
TOWEL GREEN STERILE FF (TOWEL DISPOSABLE) ×4 IMPLANT
TRAY CATH 16FR W/PLASTIC CATH (SET/KITS/TRAYS/PACK) IMPLANT
WATER STERILE IRR 1000ML POUR (IV SOLUTION) IMPLANT
YANKAUER SUCT BULB TIP NO VENT (SUCTIONS) ×2 IMPLANT

## 2021-07-05 NOTE — Anesthesia Procedure Notes (Signed)
Anesthesia Regional Block: Adductor canal block   Pre-Anesthetic Checklist: , timeout performed,  Correct Patient, Correct Site, Correct Laterality,  Correct Procedure, Correct Position, site marked,  Risks and benefits discussed,  Surgical consent,  Pre-op evaluation,  At surgeon's request and post-op pain management  Laterality: Right  Prep: chloraprep       Needles:  Injection technique: Single-shot  Needle Type: Echogenic Stimulator Needle     Needle Length: 10cm  Needle Gauge: 20     Additional Needles:   Procedures:,,,, ultrasound used (permanent image in chart),,    Narrative:  Start time: 07/05/2021 7:00 AM End time: 07/05/2021 7:05 AM Injection made incrementally with aspirations every 5 mL.  Performed by: Personally  Anesthesiologist: [name], MD  Additional Notes: A functioning IV was confirmed and monitors were applied.  Sterile prep and drape, hand hygiene and sterile gloves were used.  Negative aspiration and test dose prior to incremental administration of local anesthetic. The patient tolerated the procedure well.Ultrasound  guidance: relevant anatomy identified, needle position confirmed, local anesthetic spread visualized around nerve(s), vascular puncture avoided.  Image printed for medical record.

## 2021-07-05 NOTE — Op Note (Signed)
NAME: Miranda [name], [place] MEDICAL RECORD NO: [id number] ACCOUNT NO: [id number] DATE OF BIRTH: 1976-05-06 FACILITY: [facility] LOCATION: [id number] PHYSICIAN: Yetta Barre. Marlou Sa, MD  Operative Report   DATE OF PROCEDURE: 07/05/2021  PREOPERATIVE DIAGNOSIS:  Right knee arthritis.  POSTOPERATIVE DIAGNOSIS:  Right knee arthritis.  PROCEDURE:  Right total knee replacement using Stryker press-fit size 1 femur, size 2 tibial plateau, 10 mm deep dish polyethylene insert posterior cruciate retaining, 32 mm 3-peg patella.  SURGEON ATTENDING:  Meredith Pel, MD  ASSISTANT:  Annie Main.  INDICATIONS:  The patient is a 45 year old patient with end-stage medial compartment arthritis of the knee with global pain. Presents now for operative management after explanation of risks and benefits.  DESCRIPTION OF PROCEDURE:  The patient was brought to the operating room where spinal anesthetic was induced.  Preoperative antibiotics were administered.  Timeout was called.  Right leg was pre-scrubbed with alcohol and Betadine and allowed to air dry,  prepped with DuraPrep solution and draped in a sterile manner.  Charlie Pitter was used to cover the operative field.  A timeout was called.  Leg was elevated, exsanguinated with the Esmarch wrap and tourniquet inflated.  Anterior approach to knee was made.  Skin  and subcutaneous tissue were sharply divided.  IrriSept solution utilized.  Median parapatellar arthrotomy was made and marked with a #1 Vicryl suture.  The IrriSept solution was then used to irrigate the joint.  Next, the lateral patellofemoral  ligament was released.  Fat pad partially excised.  Minimal medial soft tissue dissection was performed.  The soft tissue removed from the anterior distal femur.  Next, the anterior horn of the lateral meniscus was released.  Intramedullary alignment was  then used to make a cut off the least affected lateral tibial plateau.  There was mild wear on the lateral side, but more  significant wear within the patellofemoral joint and end-stage arthritis in the medial compartment, both on the femur and tibia.   Next, 9 mm cut was made off the least affected lateral tibial plateau with the posterior neurovascular structures and collaterals protected.  The cut was made and measured out at 10 mm off the lateral side and approximately 7 mm off the medial tibial  plateau.  An 8 mm cut was then made on the distal femur, cut in 5 degrees of valgus.  This allowed a 9 mm spacer to fit nicely within the extension gap.  The femur was then sized to a size 1.  Anterior, posterior and chamfer cuts were made.  PCL was  preserved, but was recessed after the tibial cut.  The spacing gap there was slightly bigger than a 9 in flexion and slightly less than 11.  Next, a femoral tray was placed and correct rotation was marked in line with the medial third of the tibial  tubercle.  At this time, trial components placed. Patella was then cut down from a 24 to 14 and the 3 peg 32 patella was placed.  Bone quality was excellent.  With a #1 femur, 2 tibial tray and a 9 mm spacer utilized and 11 mm spacer utilized, decision  was made to go with a 10 spacer.  The patient did achieve full extension, with the slight hyperextension with the 9, full extension with the 11 but slightly more liftoff with the 11 in flexion past 90 and therefore 10 mm was chosen.  PCL was recessed  appropriately.  Trial components were removed.  Thorough irrigation performed with 3 liters of  pulsatile irrigation followed by a solution of Marcaine, Exparel and saline injected into the capsule.  Next, TXA sponge was allowed to sit along with IrriSept  solution for 3 minutes within the knee.  These were removed.  Vancomycin powder placed within the intramedullary canal of the tibia, component tibia was then press fit along with the femur and the patella along with a 10 mm polyethylene spacer.   Excellent stability achieved, very good  stability to varus and valgus stress at 0, 30 and 90 degrees achieved and the patella tracked well using no thumbs technique.  Next, the tourniquet was released, bleeding points encountered were controlled using  electrocautery, pouring irrigation utilized.  At this point, the knee was then closed over a bolster with the arthrotomy being closed using #1 Vicryl suture.  Final irrigation with IrriSept and placement of vancomycin powder placed prior to full closure  of the arthrotomy.  Next, a solution of Marcaine, morphine, clonidine injected into the knee for postoperative pain relief.  The final closure was performed using 0 Vicryl suture, 2-0 Vicryl suture, 3-0 Monocryl, Steri-Strips, and Aquacel dressing  followed by Ace wrap, IceMan and knee immobilizer.  The patient tolerated the procedure well without immediate complication.  Luke's assistance was required at all times for retraction, opening, closing, mobilization of tissue.  His assistance was a  medical necessity.   SHW D: 07/05/2021 10:53:34 am T: 07/05/2021 11:24:00 am  JOB: [id number]

## 2021-07-05 NOTE — Anesthesia Postprocedure Evaluation (Signed)
Anesthesia Post Note  Patient: Miranda [name]  Procedure(s) Performed: TOTAL KNEE ARTHROPLASTY (Right: Knee)     Patient location during evaluation: PACU Anesthesia Type: Regional and Spinal Level of consciousness: oriented and awake and alert Pain management: pain level controlled Vital Signs Assessment: post-procedure vital signs reviewed and stable Respiratory status: spontaneous breathing and respiratory function stable Cardiovascular status: blood pressure returned to baseline and stable Postop Assessment: no headache, no backache, no apparent nausea or vomiting, patient able to bend at knees and spinal receding Anesthetic complications: no   No notable events documented.  Last Vitals:  Vitals:   07/05/21 1125 07/05/21 1200  BP: 120/76 133/78  Pulse: 76 70  Resp: 20 16  Temp: (!) 36.3 C (!) 36.4 C  SpO2: 99% 98%    Last Pain:  Vitals:   07/05/21 1200  TempSrc: Oral  PainSc:                  [name]

## 2021-07-05 NOTE — Anesthesia Procedure Notes (Signed)
Procedure Name: MAC Date/Time: 07/05/2021 7:30 AM Performed by: Michele Rockers, CRNA Pre-anesthesia Checklist: Patient identified, Emergency Drugs available, Suction available, Timeout performed and Patient being monitored Patient Re-evaluated:Patient Re-evaluated prior to induction Oxygen Delivery Method: Simple face mask

## 2021-07-05 NOTE — Brief Op Note (Signed)
   07/05/2021  10:46 AM  PATIENT:  Miranda [name]  45 y.o. female  PRE-OPERATIVE DIAGNOSIS:  right knee osteoarthritis  POST-OPERATIVE DIAGNOSIS:  right knee osteoarthritis  PROCEDURE:  Procedure(s): TOTAL KNEE ARTHROPLASTY  SURGEON:  Surgeon(s): Meredith Pel, MD  ASSISTANT: magnant pa  ANESTHESIA:   spinal  EBL: 75 ml    Total I/O In: 1700 [I.V.:1000; IV Piggyback:700] Out: 475 [Urine:400; Blood:75]  BLOOD ADMINISTERED: none  DRAINS: none   LOCAL MEDICATIONS USED: Marcaine morphine clonidine vancomycin powder  SPECIMEN:  No Specimen  COUNTS:  YES  TOURNIQUET:   Total Tourniquet Time Documented: Thigh (Right) - 99 minutes Total: Thigh (Right) - 99 minutes   DICTATION: .[id number] PLAN OF CARE: Admit for overnight observation  PATIENT DISPOSITION:  PACU - hemodynamically stable

## 2021-07-05 NOTE — Progress Notes (Signed)
Physical Therapy Evaluation Patient Details Name: MITRA NASRALLAH MRN: [id number] DOB: [birth year] Today's Date: 07/05/2021   History of Present Illness  Pt is a 45yo female presenting s/p R TKA on 9/6. PMH: DM, PTSD, TIA, CVA of L MCA 2017, HTN, OSA w/ CPAP, dissassociative identity disorder, depression.  Clinical Impression  Pt presents s/p the procedure above. Required min assist for bed mobility, min guard for transfers and short distance ambulation in room with RW. Educated pt on knee precautions, pt verbalized understanding. Pt demonstrated moderate shaking and reported feeling cold throughout session; notified RN. Pt completed ankle pump exercises and tolerated overall session well. We will continue to follow her acutely to promote independence with functional mobility.     Follow Up Recommendations Follow surgeon's recommendation for DC plan and follow-up therapies    Equipment Recommendations  None recommended by PT    Recommendations for Other Services       Precautions / Restrictions Precautions Precautions: Fall;Knee Precaution Booklet Issued: No Precaution Comments: Reviewed knee precautions. Restrictions Weight Bearing Restrictions: Yes RLE Weight Bearing: Weight bearing as tolerated      Mobility  Bed Mobility Overal bed mobility: Needs Assistance Bed Mobility: Sit to Supine       Sit to supine: Min assist   General bed mobility comments: Pt required min assist for RLE advancement off bed and for trunk control to raise into sitting.    Transfers Overall transfer level: Needs assistance Equipment used: Rolling walker (2 wheeled) Transfers: Sit to/from Stand Sit to Stand: Min guard         General transfer comment: Pt min guard for safety; moderate verbal cuing for technique especially hand placement, R knee extension, and powering through LLE.  Ambulation/Gait Ambulation/Gait assistance: Min guard Gait Distance (Feet): 2 Feet Assistive device:  Rolling walker (2 wheeled) Gait Pattern/deviations: Step-to pattern;Decreased step length - right;Decreased stance time - right;Decreased weight shift to right Gait velocity: Decreased   General Gait Details: Pt took ~5steps from EOB to recliner. Demonstrated gaurded gait consistent with s/p R TKA. Reduced step length, stance time, and weightshift on the right. Reliant on BUE on RW throughout. Minimal verbal cues for technique.  Stairs            Wheelchair Mobility    Modified Rankin (Stroke Patients Only)       Balance Overall balance assessment: Needs assistance Sitting-balance support: Feet unsupported;No upper extremity supported Sitting balance-Leahy Scale: Good     Standing balance support: Bilateral upper extremity supported;During functional activity Standing balance-Leahy Scale: Poor Standing balance comment: Pt reliant on BUE on RW.                             Pertinent Vitals/Pain Pain Assessment: 0-10 Pain Score: 5  Pain Location: right knee Pain Descriptors / Indicators: Operative site guarding;Discomfort Pain Intervention(s): Monitored during session;Repositioned    Home Living Family/patient expects to be discharged to:: Private residence Living Arrangements: Non-relatives/Friends Available Help at Discharge: Friend(s);Available 24 hours/day Type of Home: House Home Access: Stairs to enter Entrance Stairs-Rails: Psychiatric nurse of Steps: 5 Home Layout: Two level;Able to live on main level with bedroom/bathroom Home Equipment: Bedside commode;Cane - single point;Walker - 2 wheels;Grab bars - toilet      Prior Function Level of Independence: Independent with assistive device(s)         Comments: Uses cane secondary to pain. IND for ADLs     Hand Dominance  Dominant Hand: Right    Extremity/Trunk Assessment   Upper Extremity Assessment Upper Extremity Assessment: Overall WFL for tasks assessed    Lower  Extremity Assessment Lower Extremity Assessment: RLE deficits/detail RLE Deficits / Details: R knee flexion ~70 via visual approximation when seated EOB. Pt reported some posterior thigh and calf pain while in CPM.    Cervical / Trunk Assessment Cervical / Trunk Assessment: Normal  Communication   Communication: No difficulties  Cognition Arousal/Alertness: Awake/alert Behavior During Therapy: WFL for tasks assessed/performed Overall Cognitive Status: Within Functional Limits for tasks assessed                                        General Comments General comments (skin integrity, edema, etc.): Pt demonstrated shaking and reported feeling very cold throughout session. Added warm blankets. Pt's boyfriend and roomate present during session    Exercises Total Joint Exercises Ankle Circles/Pumps: AROM;10 reps;Both;Seated   Assessment/Plan    PT Assessment Patient needs continued PT services  PT Problem List Decreased strength;Decreased range of motion;Decreased activity tolerance;Decreased balance;Decreased mobility;Decreased knowledge of use of DME;Decreased knowledge of precautions;Pain       PT Treatment Interventions DME instruction;Gait training;Functional mobility training;Therapeutic exercise;Therapeutic activities;Balance training;Patient/family education    PT Goals (Current goals can be found in the Care Plan section)  Acute Rehab PT Goals Patient Stated Goal: To walk without pain PT Goal Formulation: With patient Time For Goal Achievement: 07/19/21 Potential to Achieve Goals: Good    Frequency 7X/week   Barriers to discharge        Co-evaluation               AM-PAC PT "6 Clicks" Mobility  Outcome Measure Help needed turning from your back to your side while in a flat bed without using bedrails?: A Little Help needed moving from lying on your back to sitting on the side of a flat bed without using bedrails?: A Little Help needed moving  to and from a bed to a chair (including a wheelchair)?: A Little Help needed standing up from a chair using your arms (e.g., wheelchair or bedside chair)?: A Little Help needed to walk in hospital room?: A Little Help needed climbing 3-5 steps with a railing? : A Lot 6 Click Score: 17    End of Session Equipment Utilized During Treatment: Gait belt Activity Tolerance: Patient limited by pain Patient left: with call bell/phone within reach;in chair;with family/visitor present Nurse Communication: Mobility status PT Visit Diagnosis: Pain;Muscle weakness (generalized) (M62.81);Other abnormalities of gait and mobility (R26.89) Pain - Right/Left: Right Pain - part of body: Knee    Time: [id number] PT Time Calculation (min) (ACUTE ONLY): 28 min   Charges:   PT Evaluation $PT Eval Low Complexity: 1 Low PT Treatments $Therapeutic Activity: 8-22 mins        Dawayne Cirri, SPT Dawayne Cirri 07/05/2021, 3:22 PM

## 2021-07-05 NOTE — Progress Notes (Signed)
Orthopedic Tech Progress Note Patient Details:  Miranda [name] January 19, 1976 [id number]  CPM Right Knee CPM Right Knee: On Right Knee Flexion (Degrees): 10 Right Knee Extension (Degrees): 40  Post Interventions Patient Tolerated: Well, Fair Instructions Provided: Care of device Ortho Devices Type of Ortho Device: Bone foam zero knee Ortho Device/Splint Interventions: Ordered   Post Interventions Patient Tolerated: Well, Fair Instructions Provided: Care of [place] 07/05/2021, 1:22 PM

## 2021-07-05 NOTE — H&P (Signed)
TOTAL KNEE ADMISSION H&P  Patient is being admitted for right total knee arthroplasty.  Subjective:  Chief Complaint:right knee pain.  HPI: Miranda [name], 45 y.o. female, has a history of pain and functional disability in the right knee due to arthritis and has failed non-surgical conservative treatments for greater than 12 weeks to includeNSAID's and/or analgesics, corticosteriod injections, flexibility and strengthening excercises, weight reduction as appropriate, and activity modification.  Onset of symptoms was gradual, starting 7 years ago with gradually worsening course since that time. The patient noted no past surgery on the right knee(s).  Patient currently rates pain in the right knee(s) at 9 out of 10 with activity. Patient has night pain, worsening of pain with activity and weight bearing, pain that interferes with activities of daily living, pain with passive range of motion, crepitus, and joint swelling.  Patient has evidence of subchondral sclerosis and joint space narrowing by imaging studies. This patient has had  extensive preoperative work-up including cardiac risk stratification with recent stress study which was within normal limits.  She has been instructed on perioperative Plavix discontinuation.  Hemoglobin A1c well below 7.8.  Even though she is only 45 years old she has failed all nonoperative measures.  She has diminished her smoking considerably.  She understands that she is in a higher risk category due to her multiple medical comorbidities. . There is no active infection.  Patient Active Problem List   Diagnosis Date Noted   Chronic diarrhea 04/29/2021   Belching 04/29/2021   Primary osteoarthritis of both knees 10/28/2020   Irritable bowel syndrome with diarrhea 10/16/2019   Tendinitis of right rotator cuff 09/17/2019   Hand pain, right 05/21/2019   Labral tear of hip joint 05/21/2019   Type 2 diabetes mellitus with diabetic neuropathy, unspecified ([place])  10/01/2018   Right hip pain 05/22/2018   Tennis Must Quervain's disease (radial styloid tenosynovitis) 04/02/2018   Carpal tunnel syndrome, right upper limb 12/31/2017   Elbow injury, right, initial encounter 12/16/2017   Chronic pain of both shoulders 11/30/2017   PTSD (post-traumatic stress disorder) 10/25/2016   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[place]) 10/25/2016   Diabetes mellitus ([place]) 10/25/2016   Major depressive disorder, recurrent episode, severe, with psychosis ([place]) 10/20/2016   Major depressive disorder, recurrent episode, severe, 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[place]) 10/19/2016   TIA (transient ischemic attack) 05/16/2016   Arterial ischemic stroke, MCA, left, acute ([place]) 03/29/2016   Plantar fasciitis 08/08/2013   Obesity, Class III, BMI 40-49.9 (morbid obesity) ([place]) 04/09/2013   Unspecified sleep apnea 03/31/2013   PCO (polycystic ovaries) 04/11/2012   Neck pain 11/21/2010   FATTY LIVER DISEASE 01/26/2010   NAUSEA WITH VOMITING 01/26/2010   PORTAL HYPERTENSION 01/26/2010   Migraine headache 06/28/2009   BREAST MASS 08/21/2008   KNEE PAIN 04/14/2008   Hypothyroidism 03/17/2008   Hyperlipidemia 03/17/2008   DEPRESSION 03/17/2008   NEUROPATHY 03/17/2008   Essential hypertension 03/17/2008   ASTHMA 03/17/2008   GERD 03/17/2008   Headache 03/17/2008   Past Medical History:  Diagnosis Date   Anginal pain ([place])    Anxiety    Arthritis    Asthma    Bowel obstruction ([place])    Cardiac arrhythmia    Depression    sees Dr. Matilde Haymaker in [place]    Diabetes mellitus    sees Dr. Madelin Rear    Dyspnea    Dysrhythmia    GERD (gastroesophageal reflux disease)    Hyperlipidemia    Hypertension    Hypothyroidism  sees Dr. Elyse Hsu   Insomnia    Migraine syndrome    sees Dr. Teodora Medici at [facility]    Morbid obesity [facility])    Multiple personality [facility])    Neck pain    Neuropathy associated with endocrine disorder ([place])    Sleep apnea with use of continuous positive  airway pressure (CPAP)    Sleep apnea is resolved due to weight loss. 08/2017   Stroke ([place]) 03/25/2016   left MCA    Stroke ([place]) 04/2016   sees Dr. Teodora Medici at [facility]     Past Surgical History:  Procedure Laterality Date   blocked itestinal repair  1975   age 59 months   BREAST BIOPSY Left 2017   CHOLECYSTECTOMY  2011   INDUCED ABORTION  1996   forced abortion   INGUINAL HERNIA REPAIR Left 1980   age 26   [place] EXTRACTION  2007    Current Facility-Administered Medications  Medication Dose Route Frequency Provider Last Rate Last Admin   ceFAZolin (ANCEF) IVPB 2g/100 mL premix  2 g Intravenous On Call to OR Magnant, Gerrianne Scale, PA-C       lactated ringers infusion   Intravenous Continuous [name], MD       povidone-iodine (BETADINE) 7.5 % scrub   Topical Once Magnant, Charles L, PA-C       povidone-iodine 10 % swab 2 application  2 application Topical Once Magnant, Charles L, PA-C       povidone-iodine 10 % swab 2 application  2 application Topical Once Magnant, Charles L, PA-C       tranexamic acid (CYKLOKAPRON) 2,000 mg in sodium chloride 0.9 % 50 mL Topical Application  [id number] mg Topical To OR Marlou Sa, Tonna Corner, MD       tranexamic acid (CYKLOKAPRON) IVPB 1,000 mg  1,000 mg Intravenous To OR Magnant, Charles L, PA-C       Allergies  Allergen Reactions   Macrobid [Nitrofurantoin] Hives   Metformin And Related Other (See Comments)    Abdominal cramping    Byetta 10 Mcg Pen [Exenatide] Hives   Clindamycin/Lincomycin Nausea And Vomiting   Geodon [Ziprasidone Hcl]     Body starts shutting down   Lipitor [Atorvastatin]     Per patient this causes cramps   Nsaids     Heartburn   Penicillins Other (See Comments)    Childhood reaction Has patient had a PCN reaction causing immediate rash, facial/tongue/throat swelling, SOB or lightheadedness with hypotension: No Has patient had a PCN reaction causing severe rash  involving mucus membranes or skin necrosis: No Has patient had a PCN reaction that required hospitalization No Has patient had a PCN reaction occurring within the last 10 years: No If all of the above answers are "NO", then may proceed with Cephalosporin use.    Trulicity [Dulaglutide]     Abdominal pain    Avocado Diarrhea and Other (See Comments)    Severe cramping, sweats, and diarrhea in upper GI/stomach   Tramadol Hcl Itching and Rash    Social History   Tobacco Use   Smoking status: Every Day    Packs/day: 0.25    Years: 0.50    Pack years: 0.13    Types: Cigarettes    Start date: 02/24/2018   Smokeless tobacco: Never   Tobacco comments:    has decreased how much - trying to quit  Substance Use Topics   Alcohol use: Yes  Comment: rarely    Family History  Adopted: Yes  Problem Relation Age of Onset   Heart disease Other        on both sides of family   Diabetes Other        on both sides of family     Review of Systems  Musculoskeletal:  Positive for arthralgias.  All other systems reviewed and are negative.  Objective:  Physical Exam Vitals reviewed.  HENT:     Head: Normocephalic.     Nose: Nose normal.     Mouth/Throat:     Mouth: Mucous membranes are moist.  Eyes:     Pupils: Pupils are equal, round, and reactive to light.  Cardiovascular:     Rate and Rhythm: Normal rate.     Pulses: Normal pulses.  Pulmonary:     Effort: Pulmonary effort is normal.  Abdominal:     General: Abdomen is flat.  Musculoskeletal:     Cervical back: Normal range of motion.  Skin:    General: Skin is warm.     Capillary Refill: Capillary refill takes less than 2 seconds.  Neurological:     General: No focal deficit present.     Mental Status: She is alert.  Psychiatric:        Mood and Affect: Mood normal.  Ortho exam demonstrates full active and passive range of motion of the ankle and hip.  Pedal pulses are trace palpable.  Range of motion on the right knee  is just shy of full extension to 115 of flexion.  Collateral crucial ligaments are stable.  Patient has medial greater than lateral joint line tenderness with mild patellofemoral crepitus.  No other masses lymphadenopathy or skin changes noted in that right knee region ankle dorsiflexion intact.  Vital signs in last 24 hours: Temp:  [98.1 F (36.7 C)] 98.1 F (36.7 C) (09/06 0611) Pulse Rate:  [71] 71 (09/06 0611) Resp:  [18] 18 (09/06 0611) BP: (93)/(51) 93/51 (09/06 0611) SpO2:  [97 %] 97 % (09/06 0611) Weight:  [95.3 kg] 95.3 kg (09/06 0611)  Labs:   Estimated body mass index is 39.72 kg/m as calculated from the following:   Height as of this encounter: '5\' 1"'$  (1.549 m).   Weight as of this encounter: 95.3 kg.   Imaging Review Plain radiographs demonstrate severe degenerative joint disease of the right knee(s). The overall alignment ismild varus. The bone quality appears to be good for age and reported activity level.      Assessment/Plan:  End stage arthritis, right knee   The patient history, physical examination, clinical judgment of the provider and imaging studies are consistent with end stage degenerative joint disease of the right knee(s) and total knee arthroplasty is deemed medically necessary. The treatment options including medical management, injection therapy arthroscopy and arthroplasty were discussed at length. The risks and benefits of total knee arthroplasty were presented and reviewed. The risks due to aseptic loosening, infection, stiffness, patella tracking problems, thromboembolic complications and other imponderables were discussed. The patient acknowledged the explanation, agreed to proceed with the plan and consent was signed. Patient is being admitted for inpatient treatment for surgery, pain control, PT, OT, prophylactic antibiotics, VTE prophylaxis, progressive ambulation and ADL's and discharge planning. The patient is planning to be discharged home with  home health services plan to use Xarelto in the immediate postoperative period followed by ultrasound at 2 weeks and placement back on Plavix thereafter.     Patient's anticipated LOS  is less than 2 midnights, meeting these requirements: - Younger than 94 - Lives within 1 hour of care - Has a competent adult at home to recover with post-op recover - NO history of  - Chronic pain requiring opiods  - Diabetes  - Coronary Artery Disease  - Heart failure  - Heart attack  - Stroke  - DVT/VTE  - Cardiac arrhythmia  - Respiratory Failure/COPD  - Renal failure  - Anemia  - Advanced Liver disease

## 2021-07-05 NOTE — Transfer of Care (Signed)
Immediate Anesthesia Transfer of Care Note  Patient: Miranda [name]  Procedure(s) Performed: TOTAL KNEE ARTHROPLASTY (Right: Knee)  Patient Location: PACU  Anesthesia Type:Spinal and MAC combined with regional for post-op pain  Level of Consciousness: drowsy, patient cooperative and responds to stimulation  Airway & Oxygen Therapy: Patient Spontanous Breathing and Patient connected to nasal cannula oxygen  Post-op Assessment: Report given to RN and Post -op Vital signs reviewed and stable  Post vital signs: Reviewed and stable  Last Vitals:  Vitals Value Taken Time  BP 116/75 07/05/21 1040  Temp    Pulse 80 07/05/21 1041  Resp 21 07/05/21 1042  SpO2 95 % 07/05/21 1041  Vitals shown include unvalidated device data.  Last Pain:  Vitals:   07/05/21 0611  TempSrc: Oral  PainSc: 7       Patients Stated Pain Goal: 5 (73/71/06 2694)  Complications: No notable events documented.

## 2021-07-05 NOTE — Anesthesia Procedure Notes (Signed)
Spinal  Patient location during procedure: OR Start time: 07/05/2021 7:30 AM End time: 07/05/2021 7:36 AM Reason for block: surgical anesthesia Staffing Performed: anesthesiologist  Anesthesiologist: [name], MD Preanesthetic Checklist Completed: patient identified, IV checked, risks and benefits discussed, surgical consent, monitors and equipment checked, pre-op evaluation and timeout performed Spinal Block Patient position: sitting Prep: DuraPrep Patient monitoring: cardiac monitor, continuous pulse ox and blood pressure Approach: midline Location: L3-4 Injection technique: single-shot Needle Needle type: Pencan  Needle gauge: 24 G Needle length: 9 cm Assessment Events: CSF return Additional Notes Functioning IV was confirmed and monitors were applied. Sterile prep and drape, including hand hygiene and sterile gloves were used. The patient was positioned and the spine was prepped. The skin was anesthetized with lidocaine.  Free flow of clear CSF was obtained prior to injecting local anesthetic into the CSF.  The spinal needle aspirated freely following injection.  The needle was carefully withdrawn.  The patient tolerated the procedure well.

## 2021-07-06 ENCOUNTER — Encounter (HOSPITAL_COMMUNITY): Payer: Self-pay | Admitting: Orthopedic Surgery

## 2021-07-06 DIAGNOSIS — M1711 Unilateral primary osteoarthritis, right knee: Secondary | ICD-10-CM | POA: Diagnosis not present

## 2021-07-06 LAB — GLUCOSE, CAPILLARY
Glucose-Capillary: 170 mg/dL — ABNORMAL HIGH (ref 70–99)
Glucose-Capillary: 167 mg/dL — ABNORMAL HIGH (ref 70–99)
Glucose-Capillary: 150 mg/dL — ABNORMAL HIGH (ref 70–99)

## 2021-07-06 MED ORDER — RIVAROXABAN 10 MG PO TABS
10.0000 mg | ORAL_TABLET | Freq: Every day | ORAL | Status: DC
  Administered 2021-07-06 – 2021-07-07 (×2): 10 mg via ORAL
  Filled 2021-07-06 (×2): qty 1

## 2021-07-06 MED ORDER — INSULIN PUMP
Freq: Three times a day (TID) | SUBCUTANEOUS | Status: DC
  Filled 2021-07-06: qty 1

## 2021-07-06 NOTE — Progress Notes (Signed)
Upon assessment patient has an insulin pump, basal rate is 1.12 units/hr. Sliding scare insulin refused. Will notify Charge, RN

## 2021-07-06 NOTE — Progress Notes (Signed)
Physical Therapy Treatment Patient Details Name: Miranda [name] MRN: [id number] DOB: 1976-08-19 Today's Date: 07/06/2021    History of Present Illness Pt is a 45yo female presenting s/p R TKA on 9/6. PMH: DM, PTSD, TIA, CVA of L MCA 2017, HTN, OSA w/ CPAP, dissassociative identity disorder, depression.    PT Comments    Patient received in recliner, very pleasant but somewhat lethargic this morning. ROM approximately 8 degrees AAROM extension, only able to tolerate approximately 30 degrees AAROM flexion this morning. Had to move very carefully and slowly due to pain today, also due to lethargy. Introduced IT trainer however she was only able to tolerate 2 small steps before stating that she was exhausted. Needed lots of extra time for all activities this morning. Left up in recliner with all needs met, NT attending. Plan to return this afternoon for ongoing gait training and HEP.     Follow Up Recommendations  Follow surgeon's recommendation for DC plan and follow-up therapies     Equipment Recommendations  None recommended by PT    Recommendations for Other Services       Precautions / Restrictions Precautions Precautions: Fall;Knee Precaution Booklet Issued: No Precaution Comments: Reviewed knee precautions. Restrictions Weight Bearing Restrictions: Yes RLE Weight Bearing: Weight bearing as tolerated    Mobility  Bed Mobility Overal bed mobility: Needs Assistance Bed Mobility: Sit to Supine       Sit to supine: Min assist   General bed mobility comments: up in recliner upon entry    Transfers Overall transfer level: Needs assistance Equipment used: Rolling walker (2 wheeled) Transfers: Sit to/from Stand Sit to Stand: Min guard         General transfer comment: Pt min guard for safety; moderate verbal cuing for technique especially hand placement, R knee extension, and powering through LLE.  Ambulation/Gait Ambulation/Gait assistance: Min guard Gait  Distance (Feet): 4 Feet Assistive device: Rolling walker (2 wheeled) Gait Pattern/deviations: Step-to pattern;Decreased step length - right;Decreased stance time - right;Decreased weight shift to right;Antalgic Gait velocity: Decreased   General Gait Details: still limited to short distances due to R LE pain as well as lethargy during this morning's session. Still with antalgic gait pattern and reduced weight shift to the right.   Stairs Stairs: Yes Stairs assistance: Min guard Stair Management: Two rails;Forwards Number of Stairs: 2 General stair comments: heavy cues for sequencing/technique, had quite a bit of difficulty bringing R LE up onto even a 3 inch step today and expressed seh was too exhuasted to continue after only 2 small steps.   Wheelchair Mobility    Modified Rankin (Stroke Patients Only)       Balance Overall balance assessment: Needs assistance Sitting-balance support: No upper extremity supported;Feet supported Sitting balance-Leahy Scale: Good     Standing balance support: Bilateral upper extremity supported;During functional activity Standing balance-Leahy Scale: Poor Standing balance comment: Pt reliant on BUE on RW.                            Cognition Arousal/Alertness: Awake/alert Behavior During Therapy: WFL for tasks assessed/performed Overall Cognitive Status: Within Functional Limits for tasks assessed                                 General Comments: some slow processing, anticipate pain related. also some what lethargic- question if this is due to pain meds  Exercises      General Comments General comments (skin integrity, edema, etc.): removed CPM upon entry, RN present upon exit.      Pertinent Vitals/Pain Pain Assessment: Faces Faces Pain Scale: Hurts even more Pain Location: right knee Pain Descriptors / Indicators: Operative site guarding;Discomfort Pain Intervention(s): Limited activity within  patient's tolerance;Monitored during session    [place] expects to be discharged to:: Private residence Living Arrangements: Non-relatives/Friends Available Help at Discharge: Friend(s);Available 24 hours/day Type of Home: House Home Access: Stairs to enter Entrance Stairs-Rails: Right;Left Home Layout: Two level;Able to live on main level with bedroom/bathroom Home Equipment: Bedside commode;Cane - single point;Walker - 2 wheels;Grab bars - toilet;Grab bars - tub/shower      Prior Function Level of Independence: Independent with assistive device(s)      Comments: using cane for mobility due to pain, working full time and driving   PT Goals (current goals can now be found in the care plan section) Acute Rehab PT Goals Patient Stated Goal: To walk without pain PT Goal Formulation: With patient Time For Goal Achievement: 07/19/21 Potential to Achieve Goals: Good Progress towards PT goals: Progressing toward goals (slowly)    Frequency    7X/week      PT Plan Current plan remains appropriate    Co-evaluation              AM-PAC PT "6 Clicks" Mobility   Outcome Measure  Help needed turning from your back to your side while in a flat bed without using bedrails?: A Little Help needed moving from lying on your back to sitting on the side of a flat bed without using bedrails?: A Little Help needed moving to and from a bed to a chair (including a wheelchair)?: A Little Help needed standing up from a chair using your arms (e.g., wheelchair or bedside chair)?: A Little Help needed to walk in hospital room?: A Little Help needed climbing 3-5 steps with a railing? : A Lot 6 Click Score: 17    End of Session Equipment Utilized During Treatment: Gait belt Activity Tolerance: Patient limited by pain;Patient limited by lethargy Patient left: with call bell/phone within reach;in chair;with family/visitor present Nurse Communication: Mobility status PT Visit  Diagnosis: Pain;Muscle weakness (generalized) (M62.81);Other abnormalities of gait and mobility (R26.89) Pain - Right/Left: Right Pain - part of body: Knee     Time: 5366-4403 PT Time Calculation (min) (ACUTE ONLY): 46 min  Charges:  $Gait Training: 8-22 mins $Therapeutic Activity: 8-22 mins $Self Care/Home Management: 8-22                    Windell Norfolk, DPT, PN2   Supplemental Physical Therapist [place]    Pager [phone] Acute Rehab Office [phone]

## 2021-07-06 NOTE — Progress Notes (Signed)
Inpatient Diabetes Program Recommendations  AACE/ADA: New Consensus Statement on Inpatient Glycemic Control (2015)  Target Ranges:  Prepandial:   less than 140 mg/dL      Peak postprandial:   less than 180 mg/dL (1-2 hours)      Critically ill patients:  140 - 180 mg/dL   Lab Results  Component Value Date   GLUCAP 167 (H) 07/06/2021   HGBA1C 5.7 (H) 07/05/2021    Review of Glycemic Control Results for Miranda [name], Miranda "[name]" (MRN [id number]) as of 07/06/2021 14:30  Ref. Range 07/06/2021 07:28 07/06/2021 11:38  Glucose-Capillary Latest Ref Range: 70 - 99 mg/dL 170 (H) 167 (H)   Diabetes history: Type 2 DM Outpatient Diabetes medications: Insulin pump (see below), Ozempic 0.5 mg Qwk Current orders for Inpatient glycemic control: Novolog 0-15 units TID & HS  Inpatient Diabetes Program Recommendations:    Received call from patient's nurse regarding insulin pump discovered during AM assessment. Consult placed to follow while inpatient. Patient uses Tandem with Dexcom and is currently applied and is running at 80% basal rates since midnight of surgical procedure on 9/6. Spoke with patient regarding diabetes/insulin pump management and is followed by Dr Bernie Covey, outpatient endocrinology with last appointment 05/30/21.   Current insulin pump settings are as follows:  Basal insulin  0000-0730 1.5 units/hour 0731-0000 1.4 units/hour  Total daily basal insulin: 34.35 units/24 hours  Carb Coverage 0000-0730 1:6 1 unit for every 6 grams of carbohydrates 0731-1200 1:5 unit for every 5 grams of carbohydrates 1201-0000 1:7 unit for every 7 grams of carbohydrates  Insulin Sensitivity 1:10 1 unit drops blood glucose 10 mg/dl  Target Glucose Goals 0000-0000 110 mg/dl   In talking with the patient she states that his blood glucose normally runs very good and his last A1C was 5.8%.  Reviewed insulin pump policy with RN and patient and encouraged patient to resume settings at 100% set by  outpatient endocrinology (see above) and to go ahead and correct using the bolus feature. Patient verbalized understanding of information discussed and states that he does not have any further questions related to diabetes at this time.  NURSING: Once insulin pump order set is ordered please print off the Patient insulin pump contract and flow sheet. The insulin pump contract should be signed by the patient and then placed in the chart. The patient insulin pump flow sheet will be completed by the patient at the bedside and the RN caring for the patient will use the patient's flow sheet to document in the [facility]. RN will need to complete the Nursing Insulin Pump Flowsheet at least once a shift. Patient will need to keep extra insulin pump supplies at the bedside at all times.   Orders received from Dr Marlou Sa.  Thanks, Bronson Curb, MSN, RNC-OB Diabetes Coordinator [phone] (8a-5p)

## 2021-07-06 NOTE — Progress Notes (Signed)
Physical Therapy Treatment Patient Details Name: Miranda [name] MRN: [id number] DOB: Mar 26, 1976 Today's Date: 07/06/2021    History of Present Illness Pt is a 45yo female presenting s/p R TKA on 9/6. PMH: DM, PTSD, TIA, CVA of L MCA 2017, HTN, OSA w/ CPAP, dissassociative identity disorder, depression.    PT Comments    Patient received in bed, pleasant and cooperative. Introduced TKR exercises today, had quite a bit of difficulty with quad activation specifically- so needed Min-ModA for SLRs and supine hip ABD. Deferred SAQs/LAQs/seated hamstring curls for today. Tolerated progression of gait distance with RW, but very fatigued afterwards. Did have great carryover of safety/sequencing with RW and gait however. Left in bed with all needs met. Will benefit from BID sessions tomorrow.     Follow Up Recommendations  Follow surgeon's recommendation for DC plan and follow-up therapies     Equipment Recommendations  None recommended by PT    Recommendations for Other Services       Precautions / Restrictions Precautions Precautions: Fall;Knee Precaution Booklet Issued: No Precaution Comments: Reviewed knee precautions. Restrictions Weight Bearing Restrictions: Yes RLE Weight Bearing: Weight bearing as tolerated    Mobility  Bed Mobility Overal bed mobility: Needs Assistance Bed Mobility: Supine to Sit;Sit to Supine     Supine to sit: Min assist Sit to supine: Min assist   General bed mobility comments: MinA for R LE management getting in/out of bed    Transfers Overall transfer level: Needs assistance Equipment used: Rolling walker (2 wheeled) Transfers: Sit to/from Stand Sit to Stand: Min guard         General transfer comment: min guard for safety with occasional cues for sequencing- only min cues this afternoon but needed lots of extra time  Ambulation/Gait Ambulation/Gait assistance: Min guard Gait Distance (Feet): 44 Feet (32f, 323f Assistive device:  Rolling walker (2 wheeled) Gait Pattern/deviations: Step-to pattern;Decreased step length - right;Decreased stance time - right;Decreased weight shift to right;Antalgic Gait velocity: Decreased   General Gait Details: tolerated progression of gait distance this afternoon but still limited by pain; had great carryover of sequencing with RW with gait, however   Stairs             Wheelchair Mobility    Modified Rankin (Stroke Patients Only)       Balance Overall balance assessment: Needs assistance Sitting-balance support: No upper extremity supported;Feet supported Sitting balance-Leahy Scale: Good     Standing balance support: Bilateral upper extremity supported;During functional activity Standing balance-Leahy Scale: Poor Standing balance comment: Pt reliant on BUE on RW.                            Cognition Arousal/Alertness: Awake/alert Behavior During Therapy: WFL for tasks assessed/performed Overall Cognitive Status: Within Functional Limits for tasks assessed                                 General Comments: more awake this afternoon but still wtih slow processing      Exercises      General Comments        Pertinent Vitals/Pain Pain Assessment: Faces Faces Pain Scale: Hurts even more Pain Location: right knee Pain Descriptors / Indicators: Operative site guarding;Discomfort Pain Intervention(s): Limited activity within patient's tolerance;Monitored during session    Home Living  Prior Function            PT Goals (current goals can now be found in the care plan section) Acute Rehab PT Goals Patient Stated Goal: To walk without pain PT Goal Formulation: With patient Time For Goal Achievement: 07/19/21 Potential to Achieve Goals: Good Progress towards PT goals: Progressing toward goals (slowly)    Frequency    7X/week      PT Plan Current plan remains appropriate     Co-evaluation              AM-PAC PT "6 Clicks" Mobility   Outcome Measure  Help needed turning from your back to your side while in a flat bed without using bedrails?: A Little Help needed moving from lying on your back to sitting on the side of a flat bed without using bedrails?: A Little Help needed moving to and from a bed to a chair (including a wheelchair)?: A Little Help needed standing up from a chair using your arms (e.g., wheelchair or bedside chair)?: A Little Help needed to walk in hospital room?: A Little Help needed climbing 3-5 steps with a railing? : A Lot 6 Click Score: 17    End of Session   Activity Tolerance: Patient tolerated treatment well Patient left: in bed;with call bell/phone within reach   PT Visit Diagnosis: Pain;Muscle weakness (generalized) (M62.81);Other abnormalities of gait and mobility (R26.89) Pain - Right/Left: Right Pain - part of body: Knee     Time: 1040-4591 PT Time Calculation (min) (ACUTE ONLY): 39 min  Charges:  $Gait Training: 8-22 mins $Therapeutic Exercise: 8-22 mins $Therapeutic Activity: 8-22 mins                    Windell Norfolk, DPT, PN2   Supplemental Physical Therapist [place]    Pager [phone] Acute Rehab Office [phone]

## 2021-07-06 NOTE — Plan of Care (Signed)

## 2021-07-06 NOTE — TOC Initial Note (Signed)
Transition of Care [facility]) - Initial/Assessment Note    Patient Details  Name: Miranda [name] MRN: [id number] Date of Birth: 1975-11-06  Transition of [facility] CM/SW Contact:    Sharin Mons, RN Phone Number: 07/06/2021, 12:28 PM  Clinical Narrative:                     -s/p R TKA, 9/6  From home with boyfriend. PTA independent with ADL's, no DME usage. Pt with DME ( CPM, RW, and 3in1/BSC) @ home. PTA ortho office arranged home health services with [place], start of care within 48 hours post d/c.  Pt states boyfriend to assist with care once d/c.  Pt without Rx med concerns or affordability issues.  TOC team will continue to monitor and assist with TOC needs...Marland KitchenMarland Kitchen  PCP: Vonita Moss  Expected Discharge Plan: [place] Barriers to Discharge: Continued Medical Work up   Patient Goals and CMS Choice     Choice offered to / list presented to : Patient  Expected Discharge Plan and Services Expected Discharge Plan: [place]   Discharge Planning Services: CM Consult   HH Arranged: PT [facility] Agency: [place] Date [place]: 07/06/21 Time HH Agency Contacted: 50 Representative spoke with at [place]: [place]  Prior Living Arrangements/Services       Do you feel safe going back to the place where you live?: Yes      Need for Family Participation in Patient Care: Yes (Comment) Care giver support system in place?: Yes (comment)   Criminal Activity/Legal Involvement Pertinent to Current Situation/Hospitalization: No - Comment as needed  Activities of Daily Living      Permission Sought/Granted                  Emotional Assessment Appearance:: Appears stated age Attitude/Demeanor/Rapport: Engaged Affect (typically observed): Accepting Orientation: : Oriented to Situation, Oriented to  Time, Oriented to Place, Oriented to Self Alcohol / Substance Use: Not Applicable Psych  Involvement: No (comment)  Admission diagnosis:  S/P total knee arthroplasty, right [Z96.651] Patient Active Problem List   Diagnosis Date Noted   S/P total knee arthroplasty, right 07/05/2021   Chronic diarrhea 04/29/2021   Belching 04/29/2021   Primary osteoarthritis of both knees 10/28/2020   Irritable bowel syndrome with diarrhea 10/16/2019   Tendinitis of right rotator cuff 09/17/2019   Hand pain, right 05/21/2019   Labral tear of hip joint 05/21/2019   Type 2 diabetes mellitus with diabetic neuropathy, unspecified ([place]) 10/01/2018   Right hip pain 05/22/2018   [place] Quervain's disease (radial styloid tenosynovitis) 04/02/2018   Carpal tunnel syndrome, right upper limb 12/31/2017   Elbow injury, right, initial encounter 12/16/2017   Chronic pain of both shoulders 11/30/2017   PTSD (post-traumatic stress disorder) 10/25/2016   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[place]) 10/25/2016   Diabetes mellitus ([place]) 10/25/2016   Major depressive disorder, recurrent episode, severe, with psychosis ([place]) 10/20/2016   Major depressive disorder, recurrent episode, severe, 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[place]) 10/19/2016   TIA (transient ischemic attack) 05/16/2016   Arterial ischemic stroke, MCA, left, acute ([place]) 03/29/2016   Plantar fasciitis 08/08/2013   Obesity, Class III, BMI 40-49.9 (morbid obesity) ([place]) 04/09/2013   Unspecified sleep apnea 03/31/2013   PCO (polycystic ovaries) 04/11/2012   Neck pain 11/21/2010   FATTY LIVER DISEASE 01/26/2010   NAUSEA WITH VOMITING 01/26/2010   PORTAL HYPERTENSION 01/26/2010   Migraine headache 06/28/2009   BREAST  MASS 08/21/2008   KNEE PAIN 04/14/2008   Hypothyroidism 03/17/2008   Hyperlipidemia 03/17/2008   DEPRESSION 03/17/2008   NEUROPATHY 03/17/2008   Essential hypertension 03/17/2008   ASTHMA 03/17/2008   GERD 03/17/2008   Headache 03/17/2008   PCP:  Vonita Moss, NP Pharmacy:   [facility] [id number] - [place], [place] [id number] [place] [address] Phone: [phone] Fax: [phone]  [facility] [place], [place] [address] Phone: [phone] Fax: [phone]  [facility] [address] [place], [address] [place] [address] Phone: [phone] Fax: [phone]     Social Determinants of Health (SDOH) Interventions    Readmission Risk Interventions No flowsheet data found.

## 2021-07-06 NOTE — Progress Notes (Signed)
Unit director aware of insulin pump, and will provide correct documentation per policy

## 2021-07-06 NOTE — Evaluation (Signed)
Occupational Therapy Evaluation Patient Details Name: Miranda [name] MRN: [id number] DOB: Nov 05, 1975 Today's Date: 07/06/2021    History of Present Illness Pt is a 45yo female presenting s/p R TKA on 9/6. PMH: DM, PTSD, TIA, CVA of L MCA 2017, HTN, OSA w/ CPAP, dissassociative identity disorder, depression.   Clinical Impression   PTA patient independent and driving, using cane for mobility due to pain. Admitted for above and presenting with problem list below, including R knee pain, decreased activity tolerance and impaired balance.  Educated on knee precautions and safety, compensatory strategies for ADLs.  She requires min guard for transfers and limited in room mobility using RW, min guard for toileting and up to mod assist for LB ADLs.  She will benefit from further OT services while admitted to optimize independence, safety and tolerance with ADLs/mobility but anticipate no further needs after dc home.     Follow Up Recommendations  No OT follow up;Supervision - Intermittent    Equipment Recommendations  None recommended by OT    Recommendations for Other Services       Precautions / Restrictions Precautions Precautions: Fall;Knee Precaution Booklet Issued: No Precaution Comments: Reviewed knee precautions. Restrictions Weight Bearing Restrictions: Yes RLE Weight Bearing: Weight bearing as tolerated      Mobility Bed Mobility Overal bed mobility: Needs Assistance Bed Mobility: Sit to Supine       Sit to supine: Min assist   General bed mobility comments: min assist for R LE mgmt to EOB and cueing for technique, increased time and effort using rails    Transfers Overall transfer level: Needs assistance Equipment used: Rolling walker (2 wheeled) Transfers: Sit to/from Stand Sit to Stand: Min guard         General transfer comment: Pt min guard for safety; moderate verbal cuing for technique especially hand placement, R knee extension, and powering through  LLE.    Balance Overall balance assessment: Needs assistance Sitting-balance support: No upper extremity supported;Feet supported Sitting balance-Leahy Scale: Good     Standing balance support: Bilateral upper extremity supported;During functional activity Standing balance-Leahy Scale: Poor Standing balance comment: Pt reliant on BUE on RW.                           ADL either performed or assessed with clinical judgement   ADL Overall ADL's : Needs assistance/impaired     Grooming: Set up;Sitting           Upper Body Dressing : Set up;Sitting   Lower Body Dressing: Minimal assistance;Sit to/from stand Lower Body Dressing Details (indicate cue type and reason): reviewed compensatory techniques, requires assist with R LE and min guard in standing Toilet Transfer: Min guard;Ambulation;RW Toilet Transfer Details (indicate cue type and reason): to [place] Manipulation and Hygiene: Min guard;Sit to/from stand;Sitting/lateral lean       Functional mobility during ADLs: Min guard;Rolling walker;Cueing for safety General ADL Comments: pt limited by pain in R knee     Vision Baseline Vision/History: 1 Wears glasses Ability to See in Adequate Light: 0 Adequate Vision Assessment?: No apparent visual deficits     Perception     Praxis      Pertinent Vitals/Pain Pain Assessment: Faces Faces Pain Scale: Hurts even more Pain Location: right knee Pain Descriptors / Indicators: Operative site guarding;Discomfort Pain Intervention(s): Limited activity within patient's tolerance;Monitored during session;Premedicated before session;Repositioned;Ice applied     Hand Dominance Right   Extremity/Trunk Assessment Upper  Extremity Assessment Upper Extremity Assessment: Overall WFL for tasks assessed   Lower Extremity Assessment Lower Extremity Assessment: Defer to PT evaluation   Cervical / Trunk Assessment Cervical / Trunk Assessment: Normal    Communication Communication Communication: No difficulties   Cognition Arousal/Alertness: Awake/alert Behavior During Therapy: WFL for tasks assessed/performed Overall Cognitive Status: Within Functional Limits for tasks assessed                                 General Comments: some slow processing, anticipate pain related   General Comments  removed CPM upon entry, RN present upon exit.    Exercises     Shoulder Instructions      Home Living Family/patient expects to be discharged to:: Private residence Living Arrangements: Non-relatives/Friends Available Help at Discharge: Friend(s);Available 24 hours/day Type of Home: House Home Access: Stairs to enter [employer] of Steps: 5 Entrance Stairs-Rails: Right;Left Home Layout: Two level;Able to live on main level with bedroom/bathroom Alternate Level Stairs-Number of Steps: 15 Alternate Level Stairs-Rails: Right Bathroom Shower/Tub: Teacher, early years/pre: Standard     Home Equipment: Bedside commode;Cane - single point;Walker - 2 wheels;Grab bars - toilet;Grab bars - tub/shower          Prior Functioning/Environment Level of Independence: Independent with assistive device(s)        Comments: using cane for mobility due to pain, working full time and driving        OT Problem List: Decreased range of motion;Decreased activity tolerance;Impaired balance (sitting and/or standing);Decreased knowledge of use of DME or AE;Decreased knowledge of precautions;Pain;Obesity      OT Treatment/Interventions: Self-care/ADL training;Therapeutic exercise;DME and/or AE instruction;Balance training;Patient/family education;Therapeutic activities    OT Goals(Current goals can be found in the care plan section) Acute Rehab OT Goals Patient Stated Goal: To walk without pain OT Goal Formulation: With patient Time For Goal Achievement: 07/20/21 Potential to Achieve Goals: Good  OT Frequency:  Min 2X/week   Barriers to D/C:            Co-evaluation              AM-PAC OT "6 Clicks" Daily Activity     Outcome Measure Help from another person eating meals?: None Help from another person taking care of personal grooming?: A Little Help from another person toileting, which includes using toliet, bedpan, or urinal?: A Little Help from another person bathing (including washing, rinsing, drying)?: A Lot Help from another person to put on and taking off regular upper body clothing?: A Little Help from another person to put on and taking off regular lower body clothing?: A Lot 6 Click Score: 17   End of Session CPM Right Knee CPM Right Knee: Off  Activity Tolerance:   Patient left:    OT Visit Diagnosis: Other abnormalities of gait and mobility (R26.89);Pain                Time: [id number] OT Time Calculation (min): 34 min Charges:  OT General Charges $OT Visit: 1 Visit OT Evaluation $OT Eval Low Complexity: 1 Low OT Treatments $Self Care/Home Management : 8-22 mins  Jolaine Artist, OT Acute Rehabilitation Services Pager [phone] Office [phone]   Delight Stare 07/06/2021, 9:40 AM

## 2021-07-06 NOTE — Progress Notes (Signed)
  Subjective: Patient stable.  Having some pain.  Has been able to get up and stand and pivot.  CPM machine in progress.   Objective: Vital signs in last 24 hours: Temp:  [97.4 F (36.3 C)-99.4 F (37.4 C)] 99.4 F (37.4 C) (09/07 0700) Pulse Rate:  [70-87] 87 (09/07 0700) Resp:  [16-21] 18 (09/07 0700) BP: (115-133)/(64-78) 130/73 (09/07 0700) SpO2:  [96 %-99 %] 98 % (09/07 0700)  Intake/Output from previous day: 09/06 0701 - 09/07 0700 In: 1990 [P.O.:240; I.V.:1000; IV Piggyback:700] Out: 475 [Urine:400; Blood:75] Intake/Output this shift: No intake/output data recorded.  Exam:  Sensation intact distally Dorsiflexion/Plantar flexion intact  Labs: No results for input(s): HGB in the last 72 hours. No results for input(s): WBC, RBC, HCT, PLT in the last 72 hours. No results for input(s): NA, K, CL, CO2, BUN, CREATININE, GLUCOSE, CALCIUM in the last 72 hours. No results for input(s): LABPT, INR in the last 72 hours.  Assessment/Plan: Impression is patient is doing well postop day 1 following knee replacement.  Continue to work on mobilization with physical therapy.  May or may not be able to discharge after 2 physical therapy sessions today.   Miranda [name] 07/06/2021, 8:17 AM

## 2021-07-07 ENCOUNTER — Other Ambulatory Visit (HOSPITAL_COMMUNITY): Payer: Self-pay

## 2021-07-07 DIAGNOSIS — M1711 Unilateral primary osteoarthritis, right knee: Secondary | ICD-10-CM | POA: Diagnosis not present

## 2021-07-07 MED ORDER — DOCUSATE SODIUM 100 MG PO CAPS
100.0000 mg | ORAL_CAPSULE | Freq: Two times a day (BID) | ORAL | 0 refills | Status: DC
  Filled 2021-07-07: qty 10, 5d supply, fill #0

## 2021-07-07 MED ORDER — ACETAMINOPHEN 325 MG PO TABS
650.0000 mg | ORAL_TABLET | Freq: Four times a day (QID) | ORAL | 0 refills | Status: DC | PRN
Start: 2021-07-07 — End: 2023-08-23
  Filled 2021-07-07: qty 30, 4d supply, fill #0

## 2021-07-07 MED ORDER — RIVAROXABAN 10 MG PO TABS
10.0000 mg | ORAL_TABLET | Freq: Every day | ORAL | 0 refills | Status: DC
  Filled 2021-07-07: qty 15, 15d supply, fill #0

## 2021-07-07 MED ORDER — OXYCODONE HCL 5 MG PO TABS
5.0000 mg | ORAL_TABLET | ORAL | 0 refills | Status: DC | PRN
  Filled 2021-07-07: qty 30, 5d supply, fill #0

## 2021-07-07 NOTE — Progress Notes (Addendum)
Physical Therapy Treatment Patient Details Name: Miranda [name] MRN: [id number] DOB: August 20, 1976 Today's Date: 07/07/2021    History of Present Illness Pt is a 45yo female presenting s/p R TKA on 9/6. PMH: DM, PTSD, TIA, CVA of L MCA 2017, HTN, OSA w/ CPAP, dissassociative identity disorder, depression.    PT Comments    Patient received in chair, pleasant and cooperative this morning.  ROM much better, 9 degrees active extension to 60 degrees AAROM flexion sitting in chair. Session focus on stair training today- able to perform 3 four inch steps and 2 six inch steps with supervision and B rails, mod cues for technique but safe and stable, just needed extra time to do so. Left up in recliner with all needs met. Plan to return this afternoon for review/progression of HEP as well as progression of gait training.   Follow Up Recommendations  Follow surgeon's recommendation for DC plan and follow-up therapies     Equipment Recommendations  None recommended by PT    Recommendations for Other Services       Precautions / Restrictions Precautions Precautions: Fall;Knee Precaution Booklet Issued: No Precaution Comments: Reviewed knee precautions. Restrictions Weight Bearing Restrictions: Yes RLE Weight Bearing: Weight bearing as tolerated    Mobility  Bed Mobility               General bed mobility comments: up in recliner upon entry    Transfers Overall transfer level: Needs assistance Equipment used: Rolling walker (2 wheeled) Transfers: Sit to/from Stand Sit to Stand: Supervision         General transfer comment: cues for hand placement and using L LE to help bend/straighten R LE, S overall  Ambulation/Gait             General Gait Details: deferred- fatigued after stair training   Stairs Stairs: Yes Stairs assistance: Supervision Stair Management: Two rails;Forwards Number of Stairs: 5 General stair comments: mod cues for sequencing, did much better  today with clearing steps with R LE and sequencing correctly. Still extremely fatigued by stair training.   Wheelchair Mobility    Modified Rankin (Stroke Patients Only)       Balance Overall balance assessment: Needs assistance Sitting-balance support: No upper extremity supported;Feet supported Sitting balance-Leahy Scale: Good     Standing balance support: Bilateral upper extremity supported;During functional activity Standing balance-Leahy Scale: Fair Standing balance comment: Pt reliant on BUE on RW.                            Cognition Arousal/Alertness: Awake/alert Behavior During Therapy: WFL for tasks assessed/performed Overall Cognitive Status: Within Functional Limits for tasks assessed                                        Exercises      General Comments        Pertinent Vitals/Pain Pain Assessment: Faces Faces Pain Scale: Hurts little more Pain Location: right knee Pain Descriptors / Indicators: Operative site guarding;Discomfort Pain Intervention(s): Limited activity within patient's tolerance;Monitored during session    Home Living                      Prior Function            PT Goals (current goals can now be found in the care plan section)  Acute Rehab PT Goals Patient Stated Goal: To walk without pain PT Goal Formulation: With patient Time For Goal Achievement: 07/19/21 Potential to Achieve Goals: Good Progress towards PT goals: Progressing toward goals    Frequency    7X/week      PT Plan Current plan remains appropriate    Co-evaluation              AM-PAC PT "6 Clicks" Mobility   Outcome Measure  Help needed turning from your back to your side while in a flat bed without using bedrails?: A Little Help needed moving from lying on your back to sitting on the side of a flat bed without using bedrails?: A Little Help needed moving to and from a bed to a chair (including a  wheelchair)?: A Little Help needed standing up from a chair using your arms (e.g., wheelchair or bedside chair)?: A Little Help needed to walk in hospital room?: A Little Help needed climbing 3-5 steps with a railing? : A Little 6 Click Score: 18    End of Session   Activity Tolerance: Patient tolerated treatment well Patient left: with call bell/phone within reach;in chair   PT Visit Diagnosis: Pain;Muscle weakness (generalized) (M62.81);Other abnormalities of gait and mobility (R26.89) Pain - Right/Left: Right Pain - part of body: Knee     Time: 0932-1002 PT Time Calculation (min) (ACUTE ONLY): 30 min  Charges:  $Gait Training: 23-37 mins                    Windell Norfolk, DPT, PN2   Supplemental Physical Therapist [place]    Pager [phone] Acute Rehab Office [phone]

## 2021-07-07 NOTE — Progress Notes (Signed)
Physical Therapy Treatment Patient Details Name: Miranda [name] MRN: [id number] DOB: 08-Feb-1976 Today's Date: 07/07/2021    History of Present Illness Pt is a 45yo female presenting s/p R TKA on 9/6. PMH: DM, PTSD, TIA, CVA of L MCA 2017, HTN, OSA w/ CPAP, dissassociative identity disorder, depression.    PT Comments    Patient received in bathroom, pleasant and cooperative. Continues to do well with functional transfers with increased time, attempted to progress gait distance but limited by pain this afternoon. Reviewed HEP exercises as tolerated (limited by pain and fatigue), also took time to address questions from patient and family. Left in bed with all needs met, possibly discharging later today. Thank you for the opportunity to participate in her care!     Follow Up Recommendations  Follow surgeon's recommendation for DC plan and follow-up therapies     Equipment Recommendations  None recommended by PT    Recommendations for Other Services       Precautions / Restrictions Precautions Precautions: Fall;Knee Precaution Booklet Issued: No Precaution Comments: Reviewed knee precautions. Restrictions Weight Bearing Restrictions: Yes RLE Weight Bearing: Weight bearing as tolerated    Mobility  Bed Mobility Overal bed mobility: Needs Assistance Bed Mobility: Sit to Supine       Sit to supine: Min assist   General bed mobility comments: very light MinA for steadying of R LE as she used rolled sheet to bring legs back up into the bed    Transfers Overall transfer level: Needs assistance Equipment used: Rolling walker (2 wheeled) Transfers: Sit to/from Stand Sit to Stand: Min guard         General transfer comment: able to perform functional transfers with min guard and significantly increased time, good awareness of safety and hand placement  Ambulation/Gait Ambulation/Gait assistance: Min guard Gait Distance (Feet): 20 Feet Assistive device: Rolling walker  (2 wheeled) Gait Pattern/deviations: Step-to pattern;Decreased step length - right;Decreased stance time - right;Decreased weight shift to right;Antalgic Gait velocity: Decreased   General Gait Details: more pain limited this afternoon, unable to significantly progress gait distance due to pain but safe and steady with RW   Stairs             Wheelchair Mobility    Modified Rankin (Stroke Patients Only)       Balance Overall balance assessment: Needs assistance Sitting-balance support: No upper extremity supported;Feet supported Sitting balance-Leahy Scale: Good     Standing balance support: Bilateral upper extremity supported;During functional activity Standing balance-Leahy Scale: Poor Standing balance comment: Pt reliant on BUE on RW.                            Cognition Arousal/Alertness: Awake/alert Behavior During Therapy: WFL for tasks assessed/performed Overall Cognitive Status: Within Functional Limits for tasks assessed                                        Exercises      General Comments        Pertinent Vitals/Pain Pain Assessment: Faces Faces Pain Scale: Hurts even more Pain Location: right knee Pain Descriptors / Indicators: Operative site guarding;Discomfort Pain Intervention(s): Limited activity within patient's tolerance;Monitored during session;Patient requesting pain meds-RN notified    Home Living  Prior Function            PT Goals (current goals can now be found in the care plan section) Acute Rehab PT Goals Patient Stated Goal: To walk without pain PT Goal Formulation: With patient Time For Goal Achievement: 07/19/21 Potential to Achieve Goals: Good Progress towards PT goals: Progressing toward goals    Frequency    7X/week      PT Plan Current plan remains appropriate    Co-evaluation              AM-PAC PT "6 Clicks" Mobility   Outcome Measure   Help needed turning from your back to your side while in a flat bed without using bedrails?: A Little Help needed moving from lying on your back to sitting on the side of a flat bed without using bedrails?: A Little Help needed moving to and from a bed to a chair (including a wheelchair)?: A Little Help needed standing up from a chair using your arms (e.g., wheelchair or bedside chair)?: A Little Help needed to walk in hospital room?: A Little Help needed climbing 3-5 steps with a railing? : A Little 6 Click Score: 18    End of Session   Activity Tolerance: Patient tolerated treatment well;Patient limited by pain Patient left: in bed;with call bell/phone within reach;with family/visitor present Nurse Communication: Mobility status PT Visit Diagnosis: Pain;Muscle weakness (generalized) (M62.81);Other abnormalities of gait and mobility (R26.89) Pain - Right/Left: Right Pain - part of body: Knee     Time: 6979-4801 PT Time Calculation (min) (ACUTE ONLY): 27 min  Charges:  $Gait Training: 8-22 mins $Therapeutic Exercise: 8-22 mins                    Windell Norfolk, DPT, PN2   Supplemental Physical Therapist [place]    Pager [phone] Acute Rehab Office [phone]

## 2021-07-07 NOTE — Progress Notes (Signed)
Patient stable.  Did well with physical therapy today.  Denies any complaints of chest pain, shortness of breath, calf pain.  She was able to do multiple stairs in therapy.  Denies any lightheadedness or dizziness.  She feels well enough for discharge home.  On exam she has no gross blood or drainage overlying her incisional bandage.  No calf tenderness.  Negative Homans' sign.  She is not able to perform straight leg raise yet.  2+ DP pulse of the operative extremity.  Plan for her to discharge home today.  She will be on Xarelto for DVT prophylaxis and likely continue this until her postop visit in 2 weeks.  Ultrasound at that time to rule out DVT and then discontinue Xarelto if she is negative.  Patient agreed with plan.  She will contact the office with any questions between now and her first postop appointment.

## 2021-07-07 NOTE — Progress Notes (Signed)
Occupational Therapy Treatment Patient Details Name: Miranda [name] MRN: [id number] DOB: [birth year] Today's Date: 07/07/2021    History of present illness Pt is a 45yo female presenting s/p R TKA on 9/6. PMH: DM, PTSD, TIA, CVA of L MCA 2017, HTN, OSA w/ CPAP, dissassociative identity disorder, depression.   OT comments  Pt progressing towards acute OT goals. Focus of session was walking bathroom distance, toilet transfer, and pericare. Provided setup of rolled sheet to use as leg raiser to faciliate bed mobility. Provided instruction on tub transfer using 3n1 as shower seat. Also provided level 1 theraband and instructed in a few basic exercises for general UB strengthening. Full session details below. D/c plan remains appropriate.    Follow Up Recommendations  No OT follow up;Supervision - Intermittent    Equipment Recommendations  None recommended by OT    Recommendations for Other Services      Precautions / Restrictions Precautions Precautions: Fall;Knee Precaution Booklet Issued: No Precaution Comments: Reviewed knee precautions. Restrictions Weight Bearing Restrictions: Yes RLE Weight Bearing: Weight bearing as tolerated       Mobility Bed Mobility Overal bed mobility: Needs Assistance Bed Mobility: Sit to Supine       Sit to supine: Min guard;Min assist   General bed mobility comments: in recliner at start of session. Utilized rolled sheet as leg raiser for back to bed. up to min A to steady RLE a bit    Transfers Overall transfer level: Needs assistance Equipment used: Rolling walker (2 wheeled) Transfers: Sit to/from Stand Sit to Stand: Min guard         General transfer comment: up to min guard assist for safety. to/from recliner, elevated 3n1, and EOB. Cues for technique with rw.    Balance Overall balance assessment: Needs assistance Sitting-balance support: No upper extremity supported;Feet supported Sitting balance-Leahy Scale: Good      Standing balance support: Bilateral upper extremity supported;During functional activity Standing balance-Leahy Scale: Poor Standing balance comment: Pt reliant on BUE on RW.                           ADL either performed or assessed with clinical judgement   ADL Overall ADL's : Needs assistance/impaired                         Toilet Transfer: Min guard;Ambulation;RW Toilet Transfer Details (indicate cue type and reason): walked to bathroom and utilized BSC over toilet Toileting- Clothing Manipulation and Hygiene: Min guard;Sit to/from stand;Sitting/lateral lean Toileting - Clothing Manipulation Details (indicate cue type and reason): struggled with pericare. Discussed AE (tongs) to assist with this at home. Pt reports she has something like that at home. Reviewwed technique     Functional mobility during ADLs: Min guard;Rolling walker General ADL Comments: Extra time and effortful movements. Walked recliner to bathroom toilet, then toilet transfer, pericare and back to bed. Provided setup of rolled sheet for advancing RLE onto bed. Provided instructions on using 3n1 as shower seat tub transfer.     Vision       Perception     Praxis      Cognition Arousal/Alertness: Awake/alert Behavior During Therapy: WFL for tasks assessed/performed Overall Cognitive Status: Within Functional Limits for tasks assessed  Exercises     Shoulder Instructions       General Comments      Pertinent Vitals/ Pain       Pain Assessment: 0-10 Faces Pain Scale: Hurts even more Pain Location: right knee Pain Descriptors / Indicators: Operative site guarding;Discomfort Pain Intervention(s): Limited activity within patient's tolerance;Monitored during session;Patient requesting pain meds-RN notified;Repositioned;Ice applied  Home Living                                          Prior  Functioning/Environment              Frequency  Min 2X/week        Progress Toward Goals  OT Goals(current goals can now be found in the care plan section)  Progress towards OT goals: Progressing toward goals  Acute Rehab OT Goals Patient Stated Goal: To walk without pain OT Goal Formulation: With patient Time For Goal Achievement: 07/20/21 Potential to Achieve Goals: Good ADL Goals Pt Will Perform Grooming: with modified independence;standing Pt Will Perform Lower Body Dressing: with modified independence;sit to/from stand;with adaptive equipment Pt Will Transfer to Toilet: with modified independence;ambulating Pt Will Perform Toileting - Clothing Manipulation and hygiene: with modified independence;sit to/from stand Pt Will Perform Tub/Shower Transfer: Tub transfer;with modified independence;3 in 1;grab bars;ambulating;rolling walker  Plan Discharge plan remains appropriate    Co-evaluation                 AM-PAC OT "6 Clicks" Daily Activity     Outcome Measure   Help from another person eating meals?: None Help from another person taking care of personal grooming?: A Little Help from another person toileting, which includes using toliet, bedpan, or urinal?: A Little Help from another person bathing (including washing, rinsing, drying)?: A Lot Help from another person to put on and taking off regular upper body clothing?: A Little Help from another person to put on and taking off regular lower body clothing?: A Lot 6 Click Score: 17    End of Session Equipment Utilized During Treatment: Rolling walker CPM Right Knee Additional Comments: ROM 9 degrees extension, 60 degrees active flexion sitting in chair  OT Visit Diagnosis: Unsteadiness on feet (R26.81);Pain Pain - Right/Left: Right Pain - part of body: Knee   Activity Tolerance Patient limited by pain;Patient tolerated treatment well   Patient Left in bed;with call bell/phone within reach;with  nursing/sitter in room   Nurse Communication Patient requests pain meds        Time: [id number] OT Time Calculation (min): 32 min  Charges: OT General Charges $OT Visit: 1 Visit OT Treatments $Self Care/Home Management : 23-37 mins  Tyrone Schimke, OT Acute Rehabilitation Services Pager: [phone] Office: [place], [place] 07/07/2021, 1:04 PM

## 2021-07-07 NOTE — Progress Notes (Signed)
Patient was educated on use of bone foam, however she is unable to tolerate it and has refused

## 2021-07-08 ENCOUNTER — Telehealth: Payer: Self-pay | Admitting: Orthopedic Surgery

## 2021-07-08 NOTE — Telephone Encounter (Signed)
Pt called requesting a call back. Pt has surgery 9/6 and was sent home with an ice machine with no power cord. Pt is asking for a call back at [phone].

## 2021-07-10 NOTE — Discharge Summary (Signed)
Physician Discharge Summary      Patient ID: Miranda [name] MRN: [id number] DOB/AGE: [birth year] 45 y.o.  Admit date: 07/05/2021 Discharge date: 07/07/2021  Admission Diagnoses:  Active Problems:   S/P total knee arthroplasty, right   Discharge Diagnoses:  Same  Surgeries: Procedure(s): TOTAL KNEE ARTHROPLASTY on 07/05/2021   Consultants:   Discharged Condition: Stable  Hospital Course: Miranda [name] is an 45 y.o. female who was admitted 07/05/2021 with a chief complaint of right knee pain, and found to have a diagnosis of right knee osteoarthritis.  They were brought to the operating room on 07/05/2021 and underwent the above named procedures.  Pt awoke from anesthesia without complication and was transferred to the floor. On POD1, patient's pain was controlled but moderate.  She initially struggled with mobilizing with therapy but this continually progressed over the course of her stay.  On POD 2, she was able to perform well in physical therapy and was able to ascend multiple stairs.  She was discharged home on POD 2.  Pt will f/u with Dr. Marlou Sa in clinic in ~2 weeks.   Antibiotics given:  Anti-infectives (From admission, onward)    Start     Dose/Rate Route Frequency Ordered Stop   07/05/21 1600  ceFAZolin (ANCEF) IVPB 2g/100 mL premix        2 g 200 mL/hr over 30 Minutes Intravenous Every 8 hours 07/05/21 1202 07/05/21 2344   07/05/21 0929  vancomycin (VANCOCIN) powder  Status:  Discontinued          As needed 07/05/21 0929 07/05/21 1034   07/05/21 0600  ceFAZolin (ANCEF) IVPB 2g/100 mL premix        2 g 200 mL/hr over 30 Minutes Intravenous On call to O.R. 07/05/21 [id number] 07/05/21 0758     .  Recent vital signs:  Vitals:   07/07/21 0847 07/07/21 1204  BP: 98/68 118/78  Pulse: 93 80  Resp: 17 17  Temp: 98.3 F (36.8 C) 98.4 F (36.9 C)  SpO2: 96% 98%    Recent laboratory studies:  Results for orders placed or performed during the hospital encounter of 07/05/21   Hemoglobin A1c  Result Value Ref Range   Hgb A1c MFr Bld 5.7 (H) 4.8 - 5.6 %   Mean Plasma Glucose 116.89 mg/dL  Glucose, capillary  Result Value Ref Range   Glucose-Capillary 170 (H) 70 - 99 mg/dL  Glucose, capillary  Result Value Ref Range   Glucose-Capillary 167 (H) 70 - 99 mg/dL  Glucose, capillary  Result Value Ref Range   Glucose-Capillary 150 (H) 70 - 99 mg/dL  Pregnancy, urine POC  Result Value Ref Range   Preg Test, Ur NEGATIVE NEGATIVE    Discharge Medications:   Allergies as of 07/07/2021       Reactions   Macrobid [nitrofurantoin] Hives   Metformin And Related Other (See Comments)   Abdominal cramping    Byetta 10 Mcg Pen [exenatide] Hives   Clindamycin/lincomycin Nausea And Vomiting   Geodon [ziprasidone Hcl]    Body starts shutting down   Lipitor [atorvastatin]    Per patient this causes cramps   Nsaids    Heartburn   Penicillins Other (See Comments)   Childhood reaction Has patient had a PCN reaction causing immediate rash, facial/tongue/throat swelling, SOB or lightheadedness with hypotension: No Has patient had a PCN reaction causing severe rash involving mucus membranes or skin necrosis: No Has patient had a PCN reaction that required hospitalization No Has patient had a  PCN reaction occurring within the last 10 years: No If all of the above answers are "NO", then may proceed with Cephalosporin use.   Trulicity [dulaglutide]    Abdominal pain    Avocado Diarrhea, Other (See Comments)   Severe cramping, sweats, and diarrhea in upper GI/stomach   Tramadol Hcl Itching, Rash        Medication List     STOP taking these medications    cholestyramine 4 g packet Commonly known as: Questran   HYDROcodone-acetaminophen 5-325 MG tablet Commonly known as: Norco       TAKE these medications    acetaminophen 325 MG tablet Commonly known as: TYLENOL Take 2 tablets (650 mg total) by mouth every 6 (six) hours as needed for mild pain (pain score 1-3  or temp > 100.5).   albuterol 108 (90 Base) MCG/ACT inhaler Commonly known as: ProAir HFA Inhale 2 puffs into the lungs every 4 (four) hours as needed for wheezing.   ARIPiprazole 30 MG tablet Commonly known as: ABILIFY Take 30 mg by mouth every evening.   baclofen 10 MG tablet Commonly known as: LIORESAL Take 10 mg by mouth every 4 (four) hours as needed (headaches).   Baqsimi One Pack 3 MG/DOSE Powd Generic drug: Glucagon Place 3 mg into the nose daily as needed (low blood sugar).   Biofreeze 10 % Liqd Generic drug: Menthol (Topical Analgesic) Apply 1 application topically 2 (two) times daily as needed (pain).   budesonide-formoterol 160-4.5 MCG/ACT inhaler Commonly known as: SYMBICORT Inhale 2 puffs into the lungs 2 (two) times daily. What changed:  when to take this reasons to take this   buPROPion 150 MG 24 hr tablet Commonly known as: WELLBUTRIN XL Take 150-300 mg by mouth See admin instructions. 300 mg in the morning, 150 mg in the evening   clopidogrel 75 MG tablet Commonly known as: PLAVIX TAKE ONE TABLET BY MOUTH DAILY   diclofenac Sodium 1 % Gel Commonly known as: VOLTAREN Apply 1 application topically 2 (two) times daily as needed (pain).   diphenoxylate-atropine 2.5-0.025 MG tablet Commonly known as: Lomotil Take 1 tablet by mouth every 6 (six) hours as needed for diarrhea or loose stools. What changed: how much to take   docusate sodium 100 MG capsule Commonly known as: COLACE Take 1 capsule (100 mg total) by mouth 2 (two) times daily.   famotidine 40 MG tablet Commonly known as: PEPCID Take 40 mg by mouth 2 (two) times daily.   fluticasone 50 MCG/ACT nasal spray Commonly known as: FLONASE Place 2 sprays into both nostrils daily as needed for allergies or rhinitis.   gabapentin 400 MG capsule Commonly known as: NEURONTIN Take 400 mg by mouth 3 (three) times daily.   hydrOXYzine 50 MG capsule Commonly known as: VISTARIL Take 50 mg by mouth  3 (three) times daily.   hyoscyamine 0.125 MG SL tablet Commonly known as: LEVSIN SL Place 1 tablet (0.125 mg total) under the tongue every 6 (six) hours as needed.   ibuprofen 200 MG tablet Commonly known as: ADVIL Take 800 mg by mouth in the morning and at bedtime.   insulin aspart 100 UNIT/ML injection Commonly known as: novoLOG Use in the insulin pump as directed   levothyroxine 125 MCG tablet Commonly known as: SYNTHROID Take 1 tablet (125 mcg total) by mouth daily before breakfast.   lisinopril 10 MG tablet Commonly known as: ZESTRIL Take 10 mg by mouth daily.   loratadine 10 MG tablet Commonly known as: CLARITIN  Take 10 mg by mouth daily as needed for allergies.   metoCLOPramide 5 MG tablet Commonly known as: Reglan Take 0.5 tablets (2.5 mg total) by mouth 4 (four) times daily -  before meals and at bedtime.   metoprolol succinate 25 MG 24 hr tablet Commonly known as: TOPROL-XL Take 0.5 tablets (12.5 mg total) by mouth daily. What changed: how much to take   montelukast 10 MG tablet Commonly known as: SINGULAIR Take 10 mg by mouth daily.   multivitamin tablet Take 1 tablet by mouth daily.   Nurtec 75 MG Tbdp Generic drug: Rimegepant Sulfate Take 75 mg by mouth daily as needed (migraines).   nystatin powder Commonly known as: Nyamyc Apply topically QID. What changed:  how much to take when to take this reasons to take this   ondansetron 4 MG disintegrating tablet Commonly known as: ZOFRAN-ODT Take 4 mg by mouth every 8 (eight) hours as needed for nausea or vomiting.   oxyCODONE 5 MG immediate release tablet Commonly known as: Oxy IR/ROXICODONE Take 1 tablet (5 mg total) by mouth every 4 (four) hours as needed for moderate pain (pain score 4-6).   Ozempic (0.25 or 0.5 MG/DOSE) 2 MG/1.5ML Sopn Generic drug: Semaglutide(0.25 or 0.'5MG'$ /DOS) Inject 0.5 mg into the skin every Sunday.   pantoprazole 40 MG tablet Commonly known as: PROTONIX Take 1  tablet (40 mg total) by mouth 2 (two) times daily.   prazosin 5 MG capsule Commonly known as: MINIPRESS Take 10 mg by mouth at bedtime.   promethazine 25 MG tablet Commonly known as: PHENERGAN Take 1 tablet (25 mg total) by mouth every 4 (four) hours as needed for nausea or vomiting. What changed: when to take this   rosuvastatin 20 MG tablet Commonly known as: CRESTOR Take 1 tablet (20 mg total) by mouth daily.   terconazole 0.4 % vaginal cream Commonly known as: TERAZOL 7 Place 1 applicator vaginally at bedtime as needed (yeast infections).   topiramate 100 MG tablet Commonly known as: TOPAMAX Take 100 mg by mouth 2 (two) times daily.   traZODone 150 MG tablet Commonly known as: DESYREL Take 150 mg by mouth at bedtime.   Ubrelvy 100 MG Tabs Generic drug: Ubrogepant Take 100 mg by mouth daily as needed (migraines). May repeat in 2 hours as needed. No more than 200 mg in 24 hours   valACYclovir 1000 MG tablet Commonly known as: Valtrex Take 1 tablet (1,000 mg total) by mouth 3 (three) times daily. What changed:  when to take this reasons to take this   Xarelto 10 MG Tabs tablet Generic drug: rivaroxaban Take 1 tablet (10 mg total) by mouth daily.        Diagnostic Studies: No results found.  Disposition: Discharge disposition: 01-Home or Self Care       Discharge Instructions     Call MD / Call 911   Complete by: As directed    If you experience chest pain or shortness of breath, CALL 911 and be transported to the hospital emergency room.  If you develope a fever above 101 F, pus (white drainage) or increased drainage or redness at the wound, or calf pain, call your surgeon's office.   Constipation Prevention   Complete by: As directed    Drink plenty of fluids.  Prune juice may be helpful.  You may use a stool softener, such as Colace (over the counter) 100 mg twice a day.  Use MiraLax (over the counter) for constipation as needed.  Diet - low sodium  heart healthy   Complete by: As directed    Discharge instructions   Complete by: As directed    You may shower, dressing is waterproof.  Do not remove the dressing, we will remove it at your first post-op appointment.  Do not take a bath or soak the knee in a tub or pool.  You may weightbear as you can tolerate on the operative leg with a walker.  Continue using the CPM machine 3 times per day for one hour each time, increasing the degrees of range of motion daily.  Use the blue cradle boot under your heel to work on getting your leg straight.  Do NOT put a pillow under your knee.  You will follow-up with Dr. Marlou Sa in the clinic in 2 weeks at your given appointment date.    INSTRUCTIONS AFTER JOINT REPLACEMENT   Remove items at home which could result in a fall. This includes throw rugs or furniture in walking pathways ICE to the affected joint every three hours while awake for 30 minutes at a time, for at least the first 3-5 days, and then as needed for pain and swelling.  Continue to use ice for pain and swelling. You may notice swelling that will progress down to the foot and ankle.  This is normal after surgery.  Elevate your leg when you are not up walking on it.   Continue to use the breathing machine you got in the hospital (incentive spirometer) which will help keep your temperature down.  It is common for your temperature to cycle up and down following surgery, especially at night when you are not up moving around and exerting yourself.  The breathing machine keeps your lungs expanded and your temperature down.   DIET:  As you were doing prior to hospitalization, we recommend a well-balanced diet.  DRESSING / WOUND CARE / SHOWERING  Keep the surgical dressing until follow up.  The dressing is water proof, so you can shower without any extra covering.  IF THE DRESSING FALLS OFF or the wound gets wet inside, change the dressing with sterile gauze.  Please use good hand washing techniques  before changing the dressing.  Do not use any lotions or creams on the incision until instructed by your surgeon.    ACTIVITY  Increase activity slowly as tolerated, but follow the weight bearing instructions below.   No driving for 6 weeks or until further direction given by your physician.  You cannot drive while taking narcotics.  No lifting or carrying greater than 10 lbs. until further directed by your surgeon. Avoid periods of inactivity such as sitting longer than an hour when not asleep. This helps prevent blood clots.  You may return to work once you are authorized by your doctor.     WEIGHT BEARING   Weight bearing as tolerated with assist device (walker, cane, etc) as directed, use it as long as suggested by your surgeon or therapist, typically at least 4-6 weeks.   EXERCISES  Results after joint replacement surgery are often greatly improved when you follow the exercise, range of motion and muscle strengthening exercises prescribed by your doctor. Safety measures are also important to protect the joint from further injury. Any time any of these exercises cause you to have increased pain or swelling, decrease what you are doing until you are comfortable again and then slowly increase them. If you have problems or questions, call your caregiver or physical therapist for  advice.   Rehabilitation is important following a joint replacement. After just a few days of immobilization, the muscles of the leg can become weakened and shrink (atrophy).  These exercises are designed to build up the tone and strength of the thigh and leg muscles and to improve motion. Often times heat used for twenty to thirty minutes before working out will loosen up your tissues and help with improving the range of motion but do not use heat for the first two weeks following surgery (sometimes heat can increase post-operative swelling).   These exercises can be done on a training (exercise) mat, on the floor,  on a table or on a bed. Use whatever works the best and is most comfortable for you.    Use music or television while you are exercising so that the exercises are a pleasant break in your day. This will make your life better with the exercises acting as a break in your routine that you can look forward to.   Perform all exercises about fifteen times, three times per day or as directed.  You should exercise both the operative leg and the other leg as well.  Exercises include:   Quad Sets - Tighten up the muscle on the front of the thigh (Quad) and hold for 5-10 seconds.   Straight Leg Raises - With your knee straight (if you were given a brace, keep it on), lift the leg to 60 degrees, hold for 3 seconds, and slowly lower the leg.  Perform this exercise against resistance later as your leg gets stronger.  Leg Slides: Lying on your back, slowly slide your foot toward your buttocks, bending your knee up off the floor (only go as far as is comfortable). Then slowly slide your foot back down until your leg is flat on the floor again.  Angel Wings: Lying on your back spread your legs to the side as far apart as you can without causing discomfort.  Hamstring Strength:  Lying on your back, push your heel against the floor with your leg straight by tightening up the muscles of your buttocks.  Repeat, but this time bend your knee to a comfortable angle, and push your heel against the floor.  You may put a pillow under the heel to make it more comfortable if necessary.   A rehabilitation program following joint replacement surgery can speed recovery and prevent re-injury in the future due to weakened muscles. Contact your doctor or a physical therapist for more information on knee rehabilitation.    CONSTIPATION  Constipation is defined medically as fewer than three stools per week and severe constipation as less than one stool per week.  Even if you have a regular bowel pattern at home, your normal regimen is  likely to be disrupted due to multiple reasons following surgery.  Combination of anesthesia, postoperative narcotics, change in appetite and fluid intake all can affect your bowels.   YOU MUST use at least one of the following options; they are listed in order of increasing strength to get the job done.  They are all available over the counter, and you may need to use some, POSSIBLY even all of these options:    Drink plenty of fluids (prune juice may be helpful) and high fiber foods Colace 100 mg by mouth twice a day  Senokot for constipation as directed and as needed Dulcolax (bisacodyl), take with full glass of water  Miralax (polyethylene glycol) once or twice a day as needed.  If you have tried all these things and are unable to have a bowel movement in the first 3-4 days after surgery call either your surgeon or your primary doctor.    If you experience loose stools or diarrhea, hold the medications until you stool forms back up.  If your symptoms do not get better within 1 week or if they get worse, check with your doctor.  If you experience "the worst abdominal pain ever" or develop nausea or vomiting, please contact the office immediately for further recommendations for treatment.   ITCHING:  If you experience itching with your medications, try taking only a single pain pill, or even half a pain pill at a time.  You can also use Benadryl over the counter for itching or also to help with sleep.   TED HOSE STOCKINGS:  Use stockings on both legs until for at least 2 weeks or as directed by physician office. They may be removed at night for sleeping.  MEDICATIONS:  See your medication summary on the "After Visit Summary" that nursing will review with you.  You may have some home medications which will be placed on hold until you complete the course of blood thinner medication.  It is important for you to complete the blood thinner medication as prescribed.  PRECAUTIONS:  If you experience  chest pain or shortness of breath - call 911 immediately for transfer to the hospital emergency department.   If you develop a fever greater that 101 F, purulent drainage from wound, increased redness or drainage from wound, foul odor from the wound/dressing, or calf pain - CONTACT YOUR SURGEON.                                                   FOLLOW-UP APPOINTMENTS:  If you do not already have a post-op appointment, please call the office for an appointment to be seen by your surgeon.  Guidelines for how soon to be seen are listed in your "After Visit Summary", but are typically between 1-4 weeks after surgery.  OTHER INSTRUCTIONS:   Knee Replacement:  Do not place pillow under knee, focus on keeping the knee straight while resting. CPM instructions: 0-90 degrees, 2 hours in the morning, 2 hours in the afternoon, and 2 hours in the evening. Place foam block, curve side up under heel at all times except when in CPM or when walking.  DO NOT modify, tear, cut, or change the foam block in any way.  POST-OPERATIVE OPIOID TAPER INSTRUCTIONS: It is important to wean off of your opioid medication as soon as possible. If you do not need pain medication after your surgery it is ok to stop day one. Opioids include: Codeine, Hydrocodone(Norco, Vicodin), Oxycodone(Percocet, oxycontin) and hydromorphone amongst others.  Long term and even short term use of opiods can cause: Increased pain response Dependence Constipation Depression Respiratory depression And more.  Withdrawal symptoms can include Flu like symptoms Nausea, vomiting And more Techniques to manage these symptoms Hydrate well Eat regular healthy meals Stay active Use relaxation techniques(deep breathing, meditating, yoga) Do Not substitute Alcohol to help with tapering If you have been on opioids for less than two weeks and do not have pain than it is ok to stop all together.  Plan to wean off of opioids This plan should start within  one week post op  of your joint replacement. Maintain the same interval or time between taking each dose and first decrease the dose.  Cut the total daily intake of opioids by one tablet each day Next start to increase the time between doses. The last dose that should be eliminated is the evening dose.   MAKE SURE YOU:  Understand these instructions.  Get help right away if you are not doing well or get worse.    Thank you for letting us be a part of your medical care team.  It is a privilege we respect greatly.  We hope these instructions will help you stay on track for a fast and full recovery!    Dental Antibiotics:  In most cases prophylactic antibiotics for Dental procdeures after total joint surgery are not necessary.  Exceptions are as follows:  1. History of prior total joint infection  2. Severely immunocompromised (Organ Transplant, cancer chemotherapy, Rheumatoid biologic meds such as [place])  3. Poorly controlled diabetes (A1C &gt; 8.0, blood glucose over 200)  If you have one of these conditions, contact your surgeon for an antibiotic prescription, prior to your dental procedure.   Increase activity slowly as tolerated   Complete by: As directed    Post-operative opioid taper instructions:   Complete by: As directed    POST-OPERATIVE OPIOID TAPER INSTRUCTIONS: It is important to wean off of your opioid medication as soon as possible. If you do not need pain medication after your surgery it is ok to stop day one. Opioids include: Codeine, Hydrocodone(Norco, Vicodin), Oxycodone(Percocet, oxycontin) and hydromorphone amongst others.  Long term and even short term use of opiods can cause: Increased pain response Dependence Constipation Depression Respiratory depression And more.  Withdrawal symptoms can include Flu like symptoms Nausea, vomiting And more Techniques to manage these symptoms Hydrate well Eat regular healthy meals Stay active Use relaxation  techniques(deep breathing, meditating, yoga) Do Not substitute Alcohol to help with tapering If you have been on opioids for less than two weeks and do not have pain than it is ok to stop all together.  Plan to wean off of opioids This plan should start within one week post op of your joint replacement. Maintain the same interval or time between taking each dose and first decrease the dose.  Cut the total daily intake of opioids by one tablet each day Next start to increase the time between doses. The last dose that should be eliminated is the evening dose.           Follow-up Information     [facility] Follow up.   Specialty: [facility] Why: home health PT services will be provided by [facility], start of care withon 48 hours post discharge Contact information: [address] [place] [address] [phone]         Vonita Moss, NP. Schedule an appointment as soon as possible for a visit.   Specialty: Nurse Practitioner Contact information: [address] [place] [address] [phone]                  Signed: Donella Stade 07/10/2021, 2:31 PM

## 2021-07-11 NOTE — Telephone Encounter (Signed)
Thurmond Butts said he would handle this and reach out to patient.

## 2021-07-11 NOTE — Telephone Encounter (Signed)
Sent note to [place] asking for him to advise.

## 2021-07-12 ENCOUNTER — Telehealth: Payer: Self-pay | Admitting: Orthopedic Surgery

## 2021-07-12 DIAGNOSIS — Z9889 Other specified postprocedural states: Secondary | ICD-10-CM

## 2021-07-12 NOTE — Telephone Encounter (Signed)
Contacted patient and she states that she would like to know if she is able to start taking ibuprofen again. She was told to not take any prior to surgery. Patient is s/p R TKA preformed 07/05/2021

## 2021-07-12 NOTE — Telephone Encounter (Signed)
Pt called requesting a call back about medication changes. Please call pt at [phone].

## 2021-07-13 NOTE — Telephone Encounter (Signed)
No, not while she is taking xarelto

## 2021-07-14 NOTE — Telephone Encounter (Signed)
I have contacted the patient and made her aware and she states understanding. She is requesting a refill on her oxycodone medication for pain. She has 3 pills left. This has been pended for your approval

## 2021-07-15 ENCOUNTER — Telehealth: Payer: Self-pay | Admitting: Orthopedic Surgery

## 2021-07-15 ENCOUNTER — Other Ambulatory Visit: Payer: Self-pay | Admitting: Surgical

## 2021-07-15 ENCOUNTER — Telehealth: Payer: Self-pay | Admitting: Surgical

## 2021-07-15 MED ORDER — OXYCODONE HCL 5 MG PO TABS: 5.0000 mg | ORAL_TABLET | Freq: Four times a day (QID) | ORAL | 0 refills | Status: DC | PRN

## 2021-07-15 NOTE — Telephone Encounter (Signed)
[place] physical therapist called and states pt is in a lot of pain and has no pain medication left. She states pt needs a refill on oxycodone. Also recommends he to be reevaluates, she can't get her knee to a 90 degree angle.   [phone]

## 2021-07-15 NOTE — Telephone Encounter (Signed)
Patient called asked for a call back concerning her request for a refill on Rx Oxycodone. The number to contact patient is [phone]

## 2021-07-15 NOTE — Telephone Encounter (Signed)
Sent in

## 2021-07-15 NOTE — Telephone Encounter (Signed)
Done. See other message.

## 2021-07-15 NOTE — Telephone Encounter (Signed)
Tried calling to advise sent. No answer.

## 2021-07-15 NOTE — Telephone Encounter (Deleted)
Called patient left message to return call to schedule an appointment for next week with Dr Sharol Given per [place]

## 2021-07-20 ENCOUNTER — Other Ambulatory Visit: Payer: Self-pay

## 2021-07-20 ENCOUNTER — Ambulatory Visit: Payer: Self-pay

## 2021-07-20 ENCOUNTER — Other Ambulatory Visit (HOSPITAL_COMMUNITY): Payer: Self-pay

## 2021-07-20 ENCOUNTER — Ambulatory Visit: Payer: BC Managed Care – PPO | Admitting: Gastroenterology

## 2021-07-20 ENCOUNTER — Ambulatory Visit (HOSPITAL_BASED_OUTPATIENT_CLINIC_OR_DEPARTMENT_OTHER)
Admission: RE | Admit: 2021-07-20 | Discharge: 2021-07-20 | Disposition: A | Discharge status: Home / Self Care | Payer: BC Managed Care – PPO | Source: Ambulatory Visit | Attending: Orthopedic Surgery | Admitting: Orthopedic Surgery

## 2021-07-20 ENCOUNTER — Ambulatory Visit (INDEPENDENT_AMBULATORY_CARE_PROVIDER_SITE_OTHER): Payer: BC Managed Care – PPO | Admitting: Orthopedic Surgery

## 2021-07-20 ENCOUNTER — Encounter: Payer: Self-pay | Admitting: Gastroenterology

## 2021-07-20 ENCOUNTER — Telehealth (HOSPITAL_COMMUNITY): Payer: Self-pay

## 2021-07-20 VITALS — BP 120/70 | HR 80 | Ht 61.0 in | Wt 203.6 lb

## 2021-07-20 DIAGNOSIS — M1711 Unilateral primary osteoarthritis, right knee: Secondary | ICD-10-CM

## 2021-07-20 DIAGNOSIS — K3184 Gastroparesis: Secondary | ICD-10-CM

## 2021-07-20 DIAGNOSIS — E1143 Type 2 diabetes mellitus with diabetic autonomic (poly)neuropathy: Secondary | ICD-10-CM | POA: Insufficient documentation

## 2021-07-20 DIAGNOSIS — Z9889 Other specified postprocedural states: Secondary | ICD-10-CM | POA: Diagnosis present

## 2021-07-20 DIAGNOSIS — K5900 Constipation, unspecified: Secondary | ICD-10-CM | POA: Diagnosis not present

## 2021-07-20 MED ORDER — POLYETHYLENE GLYCOL 3350 17 GM/SCOOP PO POWD: ORAL | 3 refills | Status: DC

## 2021-07-20 MED ORDER — METOCLOPRAMIDE HCL 5 MG PO TABS
2.5000 mg | ORAL_TABLET | Freq: Three times a day (TID) | ORAL | 1 refills | Status: DC
Start: 2021-07-20 — End: 2023-08-27

## 2021-07-20 NOTE — Progress Notes (Signed)
Reviewed and agree with management plan.  Ludmila Ebarb T. Kerensa Nicklas, MD FACG 

## 2021-07-20 NOTE — Patient Instructions (Addendum)
If you are age 47 or younger, your body mass index should be between 19-25. Your Body mass index is 38.47 kg/m. If this is out of the aformentioned range listed, please consider follow up with your Primary Care Provider.  __________________________________________________________  The [facility] providers would like to encourage you to use [facility] to communicate with providers for non-urgent requests or questions.  Due to long hold times on the telephone, sending your provider a message by [facility] may be a faster and more efficient way to get a response.  Please allow 48 business hours for a response.  Please remember that this is for non-urgent requests.   We have sent in a new prescription of Reglan 5 mg, take 1/2 tablet four times daily.  This evening drink 2 bottles of Magnesium Citrate   Use Miralax 1 capful in ounces of liquid twice daily.  You have been scheduled to follow up with Alonza Bogus, PA-C on August 25, 2021 at 10:00 am  Thank you for entrusting me with your care and choosing [facility].  Alonza Bogus, PA-C

## 2021-07-20 NOTE — Progress Notes (Signed)
07/20/2021 Miranda [name] [id number] 10-17-1976   HISTORY OF PRESENT ILLNESS: This is a 45 year old female who is a patient of Dr. Lynne Leader.  She was last seen by me on April 29, 2021.  She was seen for complaints of chronic diarrhea that are thought to be a component of IBS and possibly bile salt related status post cholecystectomy.  I started her on Questran once daily at lunchtime and she says that it was working really well for her.  She just had her knee replaced a couple of weeks ago and now has been really constipated because of the pain medication.  She says that she has been having a really hard time moving her bowels and just passing very small hard pieces at a time.  She is currently using stool softeners and a couple of senna each a day.  She was also complaining of of foul-smelling burps.  She is longstanding diabetic.  Gastric emptying scan confirmed fairly significant gastroparesis.  After checking with her psychiatrist for medication interactions we started her on Reglan 2.5 mg before meals and at bedtime.  She says that she was doing better and feeling pretty well on that dose.  She does admit though that since her knee replacement surgery she has not been taking that because she misplaced it when she took it to the hospital, etc.  Overall she has still been feeling well though in regards to that issue.  EGD and colonoscopy in 12/2015 showed the following:   ENDOSCOPIC IMPRESSION: 1. LA Class B esophagitis 2. Nodule in the gastric antrum; multiple biopsies performed 3. Small hiatal hernia   ENDOSCOPIC IMPRESSION: 1. Sessile polyp in the sigmoid colon; polypectomy performed with cold forceps 2. The colonic mucosa otherwise appeared normal; multiple random biopsies performed 3. The examined terminal ileum appeared to be normal   1. Surgical [P], random sites -BENIGN COLONIC MUCOSA WITH NO HISTOPATHOLOGIC ABNORMALITY. -NO EVIDENCE OF LYMPHOCYTIC OR COLLAGENOUS COLITIS,  INFLAMMATORY BOWEL DISEASE OR MALIGNANCY. 2. Surgical [P], sigmoid, polyp -HYPERPLASTIC COLONIC POLYP. -NO ADENOMATOUS CHANGE OR MALIGNANCY IDENTIFIED. 3. Surgical [P], gastric anturm nodule -CHRONIC GASTRITIS WITH MILD REACTIVE GASTROPATHY. -A WARTHIN-STARRY STAIN IS NEGATIVE FOR HELICOBACTER PYLORI. -NEGATIVE FOR INTESTINAL METAPLASIA OR MALIGNANCY.   Past Medical History:  Diagnosis Date   Anginal pain ([place])    Anxiety    Arthritis    Asthma    Bowel obstruction ([place])    Cardiac arrhythmia    Depression    sees Dr. Matilde Haymaker in [place]    Diabetes mellitus    sees Dr. Madelin Rear    Dyspnea    Dysrhythmia    GERD (gastroesophageal reflux disease)    Hyperlipidemia    Hypertension    Hypothyroidism    sees Dr. Elyse Hsu   Insomnia    Migraine syndrome    sees Dr. Teod[facility] Medici at 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 08/2017   Stroke ([place]) 03/25/2016   left MCA    Stroke [facility]) 04/2016   [facility]    Past Surgical History:  Procedure Laterality Date   blocked itestinal repair  1975   age 67 months   BREAST BIOPSY Left 2017  CHOLECYSTECTOMY  2011   INDUCED ABORTION  1996   forced abortion   INGUINAL HERNIA REPAIR Left 1980   age 1   LAPAROSCOPIC ENDOMETRIOSIS FULGURATION  1998   TOTAL KNEE ARTHROPLASTY Right 07/05/2021   Procedure: TOTAL KNEE ARTHROPLASTY;  Surgeon: Meredith Pel, MD;  Location: [place];  Service: Orthopedics;  Laterality: Right;   [place] EXTRACTION  2007    reports that she has been smoking cigarettes. She started smoking about 3 years ago. She has a 0.13 pack-year smoking history. She has never used smokeless tobacco. She reports current alcohol use. She reports current drug use. Drug:  Marijuana. family history includes Diabetes in an other family member; Heart disease in an other family member. She was adopted. Allergies  Allergen Reactions   Macrobid [Nitrofurantoin] Hives   Metformin And Related Other (See Comments)    Abdominal cramping    [place]etta 10 Mcg Pen [Exenatide] Hives   Clindamycin/Lincomycin Nausea And Vomiting   Geodon [Ziprasidone Hcl]     Body starts shutting down   Lipitor [Atorvastatin]     Per patient this causes cramps   Nsaids     Heartburn   Penicillins Other (See Comments)    Childhood reaction Has patient had a PCN reaction causing immediate rash, facial/tongue/throat swelling, SOB or lightheadedness with hypotension: No Has patient had a PCN reaction causing severe rash involving mucus membranes or skin necrosis: No Has patient had a PCN reaction that required hospitalization No Has patient had a PCN reaction occurring within the last 10 years: No If all of the above answers are "NO", then may proceed with Cephalosporin use.    Trulicity [Dulaglutide]     Abdominal pain    Avocado Diarrhea and Other (See Comments)    Severe cramping, sweats, and diarrhea in upper GI/stomach   Tramadol Hcl Itching and Rash      Outpatient Encounter Medications as of 07/20/2021  Medication Sig   acetaminophen (TYLENOL) 325 MG tablet Take 2 tablets (650 mg total) by mouth every 6 (six) hours as needed for mild pain (pain score 1-3 or temp > 100.5).   albuterol (PROAIR HFA) 108 (90 Base) MCG/ACT inhaler Inhale 2 puffs into the lungs every 4 (four) hours as needed for wheezing.   ARIPiprazole (ABILIFY) 30 MG tablet Take 30 mg by mouth every evening.   baclofen (LIORESAL) 10 MG tablet Take 10 mg by mouth every 4 (four) hours as needed (headaches).   budesonide-formoterol (SYMBICORT) 160-4.5 MCG/ACT inhaler Inhale 2 puffs into the lungs 2 (two) times daily. (Patient taking differently: Inhale 2 puffs into the lungs 2 (two) times daily as needed (asthma).)    buPROPion (WELLBUTRIN XL) 150 MG 24 hr tablet Take 150-300 mg by mouth See admin instructions. 300 mg in the morning, 150 mg in the evening   clopidogrel (PLAVIX) 75 MG tablet TAKE ONE TABLET BY MOUTH DAILY (Patient taking differently: Take 75 mg by mouth daily.)   diclofenac Sodium (VOLTAREN) 1 % GEL Apply 1 application topically 2 (two) times daily as needed (pain).   diphenoxylate-atropine (LOMOTIL) 2.5-0.025 MG tablet Take 1 tablet by mouth every 6 (six) hours as needed for diarrhea or loose stools. (Patient taking differently: Take 2 tablets by mouth every 6 (six) hours as needed for diarrhea or loose stools.)   docusate sodium (COLACE) 100 MG capsule Take 1 capsule (100 mg total) by mouth 2 (two) times daily.   famotidine (PEPCID) 40 MG tablet Take 40 mg by mouth 2 (two)  times daily.   fluticasone (FLONASE) 50 MCG/ACT nasal spray Place 2 sprays into both nostrils daily as needed for allergies or rhinitis.   gabapentin (NEURONTIN) 400 MG capsule Take 400 mg by mouth 3 (three) times daily.   Glucagon (BAQSIMI ONE PACK) 3 MG/DOSE POWD Place 3 mg into the nose daily as needed (low blood sugar).   hydrOXYzine (VISTARIL) 50 MG capsule Take 50 mg by mouth 3 (three) times daily.   hyoscyamine (LEVSIN SL) 0.125 MG SL tablet Place 1 tablet (0.125 mg total) under the tongue every 6 (six) hours as needed.   ibuprofen (ADVIL) 200 MG tablet Take 800 mg by mouth in the morning and at bedtime.   insulin aspart (NOVOLOG) 100 UNIT/ML injection Use in the insulin pump as directed   levothyroxine (SYNTHROID, LEVOTHROID) 125 MCG tablet Take 1 tablet (125 mcg total) by mouth daily before breakfast.   lisinopril (ZESTRIL) 10 MG tablet Take 10 mg by mouth daily.   loratadine (CLARITIN) 10 MG tablet Take 10 mg by mouth daily as needed for allergies.   Menthol, Topical Analgesic, (BIOFREEZE) 10 % LIQD Apply 1 application topically 2 (two) times daily as needed (pain).   metoCLOPramide (REGLAN) 5 MG tablet Take 0.5  tablets (2.5 mg total) by mouth 4 (four) times daily -  before meals and at bedtime.   metoprolol succinate (TOPROL-XL) 25 MG 24 hr tablet Take 0.5 tablets (12.5 mg total) by mouth daily. (Patient taking differently: Take 25 mg by mouth daily.)   montelukast (SINGULAIR) 10 MG tablet Take 10 mg by mouth daily.   Multiple Vitamin (MULTIVITAMIN) tablet Take 1 tablet by mouth daily.   nystatin [facility]) powder Apply topically QID. (Patient taking differently: Apply 1 application topically 2 (two) times daily as needed (yeast/ dry skin).)   ondansetron (ZOFRAN-ODT) 4 MG disintegrating tablet Take 4 mg by mouth every 8 (eight) hours as needed for nausea or vomiting.   oxyCODONE (OXY IR/ROXICODONE) 5 MG immediate release tablet Take 1 tablet (5 mg total) by mouth every 6 (six) hours as needed for moderate pain (pain score 4-6).   OZEMPIC, 0.25 OR 0.5 MG/DOSE, 2 MG/1.5ML SOPN Inject 0.5 mg into the skin every Sunday.   pantoprazole (PROTONIX) 40 MG tablet Take 1 tablet (40 mg total) by mouth 2 (two) times daily.   prazosin (MINIPRESS) 5 MG capsule Take 10 mg by mouth at bedtime.   promethazine (PHENERGAN) 25 MG tablet Take 1 tablet (25 mg total) by mouth every 4 (four) hours as needed for nausea or vomiting. (Patient taking differently: Take 25 mg by mouth every 6 (six) hours as needed for nausea or vomiting.)   Rimegepant Sulfate (NURTEC) 75 MG TBDP Take 75 mg by mouth daily as needed (migraines).   rivaroxaban (XARELTO) 10 MG TABS tablet Take 1 tablet (10 mg total) by mouth daily.   rosuvastatin (CRESTOR) 20 MG tablet Take 1 tablet (20 mg total) by mouth daily.   terconazole (TERAZOL 7) 0.4 % vaginal cream Place 1 applicator vaginally at bedtime as needed (yeast infections).   topiramate (TOPAMAX) 100 MG tablet Take 100 mg by mouth 2 (two) times daily.    traZODone (DESYREL) 150 MG tablet Take 150 mg by mouth at bedtime.   Ubrogepant (UBRELVY) 100 MG TABS Take 100 mg by mouth daily as needed (migraines).  May repeat in 2 hours as needed. No more than 200 mg in 24 hours   valACYclovir (VALTREX) 1000 MG tablet Take 1 tablet (1,000 mg total) by mouth  3 (three) times daily. (Patient taking differently: Take 1,000 mg by mouth 2 (two) times daily as needed (breakouts).)   No facility-administered encounter medications on file as of 07/20/2021.    REVIEW OF SYSTEMS  : All other systems reviewed and negative except where noted in the History of Present Illness.   PHYSICAL EXAM: BP 120/70   Pulse 80   Ht 5\' 1"  (1.549 m)   Wt 203 lb 9.6 oz (92.4 kg)   BMI 38.47 kg/m  General: Well developed white female in no acute distress Head: Normocephalic and atraumatic Eyes:  Sclerae anicteric, conjunctiva pink. Ears: Normal auditory acuity Lungs: Clear throughout to auscultation; no W/R/R. Heart: Regular rate and rhythm; no M/R/G. Abdomen: Soft, non-distended.  BS present.  Non-tender. Musculoskeletal: Symmetrical with no gross deformities  Skin: No lesions on visible extremities Extremities: No edema.  Bandage over incision line on right knee. Neurological: Alert oriented x 4, grossly non-focal Psychological:  Alert and cooperative. Normal mood and affect  ASSESSMENT AND PLAN: *Diabetic gastroparesis seen on recent gastric emptying scan.  Her diabetes is well controlled with last hemoglobin A1c of 6.1.  She was started on Reglan 2.5 mg before meals and at bedtime after confirming that this would be okay with her psychiatrist since she is on several other medications.  She was doing well with that.  She says she has misplaced her medication as she had a knee replacement a few weeks ago and had taken it to the hospital with her.  She would like a new prescription so she can get restarted although currently she is feeling fine. *Constipation:  Previously had long-standing complaints of diarrhea and incontinence and was doing well on questran.  This constipation is in the setting of recent TRK and pain  medication.  She has been very constipated.  I have asked her to get a couple of bottles of magnesium citrate and drink those this evening.  Hopefully that will help to clean her out and then she should begin MiraLAX twice daily and then she can back off on that as she decreases her pain medication use, etc.  **I am going to have her follow-up here again in 5 to 6 weeks  CC:  Vonita Moss, NP

## 2021-07-20 NOTE — Telephone Encounter (Signed)
Pharmacy Transitions of Care Follow-up Telephone Call  Date of discharge: 07/07/21  Discharge Diagnosis: total knee arthroplasty  How have you been since you were released from the hospital?  Patient doing well since discharge, wanted to verify if there was an interaction between oxycodone and tizanidine. MD went over interaction and told her to alternate tablets. Instructed patient that this was correct and that both drugs caused CNS depression and drowsiness so they should not be taken at the same time together.  Medication changes made at discharge:     START taking: acetaminophen (TYLENOL)  docusate sodium (COLACE)  Xarelto (rivaroxaban)  STOP taking: cholestyramine 4 g packet (Questran)  HYDROcodone-acetaminophen 5-325 MG tablet ([place])   Medication changes verified by the patient? Yes    Medication Accessibility:  Home Pharmacy: not discussed   Was the patient provided with refills on discharged medications? no   Have all prescriptions been transferred from [facility] to home pharmacy? N/a   Is the patient able to afford medications? Has insurance    Medication Review:  RIVAROXABAN (XARELTO)  Rivaroxaban 10 mg QD initiated on 07/08/21.  - Discussed importance of taking medication with food and around the same time everyday  - Advised patient of medications to avoid (NSAIDs, ASA)  - Educated that Tylenol (acetaminophen) will be the preferred analgesic to prevent risk of bleeding  - Emphasized importance of monitoring for signs and symptoms of bleeding (abnormal bruising, prolonged bleeding, nose bleeds, bleeding from gums, discolored urine, black tarry stools)  - Advised patient to alert all providers of anticoagulation therapy prior to starting a new medication or having a procedure   Follow-up Appointments:  PCP Hospital f/u appt confirmed? Follow up was today, MD sent in all needed refills to home pharmacy  [facility] f[facility]t confirmed?  Scheduled to see Dr. Marlou Sa on  07/20/21 @ Orthopedics.   If their condition worsens, is the pt aware to call PCP or go to the Emergency Dept.? Yes  Final Patient Assessment: Patient has follow up and knows to get refills at f/u if needed

## 2021-07-22 ENCOUNTER — Telehealth: Payer: Self-pay | Admitting: Orthopedic Surgery

## 2021-07-22 NOTE — Telephone Encounter (Signed)
Received call from Verdia Kuba -(PT) with [facility] advised discharging patient today. Patient has completed (PT) Anderson Malta advised is also following up on outpatient (PT) Patient haven't heard from [place] yet. The number to contact patient is [phone]

## 2021-07-23 ENCOUNTER — Encounter: Payer: Self-pay | Admitting: Orthopedic Surgery

## 2021-07-23 DIAGNOSIS — M1711 Unilateral primary osteoarthritis, right knee: Secondary | ICD-10-CM

## 2021-07-23 NOTE — Progress Notes (Signed)
Post-Op Visit Note   Patient: Miranda [name]           Date of Birth: 01/06/1976           MRN: [id number] Visit Date: 07/20/2021 PCP: Vonita Moss, NP   Assessment & Plan:  Chief Complaint:  Chief Complaint  Patient presents with   Right Knee - Follow-up   Visit Diagnoses:  1. Unilateral primary osteoarthritis, right knee   2. Post-operative state     Plan: Miranda [name] is a 45 y.o. female who presents s/p right total knee arthroplasty on 07/05/2021.  Patient is doing well and pain is overall controlled.  They are compliant with taking Xarelto for DVT prophylaxis.  Denies any chest pain, SOB, calf pain.  Taking pain medication every 6 hours; she has stopped taking oxycodone last 3 days altogether.  On exam, incision is healing well without any evidence of infection or dehiscence.  Range of motion of the operative knee from 5-75.  No calf tenderness.  Negative Homans' sign.  Able to perform straight leg raise without extensor lag.  No effusion on exam.    Plan is obtain ultrasound of the right lower extremity to rule out DVT.  If this is negative, transition from Xarelto to aspirin.  Refer for physical therapy at [facility].  Follow-up in 4 weeks for clinical recheck.  Radiographs appear to show implant in good position and alignment without any complicating features.  No concerning symptoms by her history today with her denying any fevers, chills, night sweats or any drainage from the incision.  Follow-Up Instructions: No follow-ups on file.   Orders:  Orders Placed This Encounter  Procedures   XR Knee 1-2 Views Right   US Venous Img Lower Unilateral Right (DVT)   Ambulatory referral to Physical Therapy   No orders of the defined types were placed in this encounter.   Imaging: No results found.  PMFS History: Patient Active Problem List   Diagnosis Date Noted   Arthritis of right knee    Diabetic gastroparesis ([place]) 07/20/2021   Constipation  07/20/2021   S/P total knee arthroplasty, right 07/05/2021   Chronic diarrhea 04/29/2021   Belching 04/29/2021   Primary osteoarthritis of both knees 10/28/2020   Irritable bowel syndrome with diarrhea 10/16/2019   Tendinitis of right rotator cuff 09/17/2019   Hand pain, right 05/21/2019   Labral tear of hip joint 05/21/2019   Type 2 diabetes mellitus with diabetic neuropathy, unspecified ([place]) 10/01/2018   Right hip pain 05/22/2018   T[place] Must Quervain's disease (radial styloid tenosynovitis) 04/02/2018   Carpal tunnel syndrome, right upper limb 12/31/2017   Elbow injury, right, initial encounter 12/16/2017   Chronic pain of both shoulders 11/30/2017   PTSD (post-traumatic stress disorder) 10/25/2016   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   Diabetes mellitus ([place]) 10/25/2016   Major depressive disorder, recurrent episode, severe, with psychosis ([place]) 10/20/2016   Major depressive disorder, recurrent episode, severe, 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[place]) 10/19/2016   TIA (transient ischemic attack) 05/16/2016   Arterial ischemic stroke, MCA, left, acute ([place]) 03/29/2016   Plantar fasciitis 08/08/2013   Obesity, Class III, BMI 40-49.9 (morbid obesity) ([place]) 04/09/2013   Unspecified sleep apnea 03/31/2013   PCO (polycystic ovaries) 04/11/2012   Neck pain 11/21/2010   FATTY LIVER DISEASE 01/26/2010   NAUSEA WITH VOMITING 01/26/2010   PORTAL HYPERTENSION 01/26/2010   Migraine headache 06/28/2009   BREAST MASS 08/21/2008   KNEE PAIN 04/14/2008   Hypothyroidism  03/17/2008   Hyperlipidemia 03/17/2008   DEPRESSION 03/17/2008   NEUROPATHY 03/17/2008   Essential hypertension 03/17/2008   ASTHMA 03/17/2008   GERD 03/17/2008   Headache 03/17/2008   Past Medical History:  Diagnosis Date   Anginal pain ([facility])    Anxiety    Arthritis    Asthma    Bowel obstruction ([facility])    Cardiac arrhythmia    Depression    sees Dr. Matilde Haymaker in [place]    Diabetes mellitus    sees Dr. Madelin Rear    Dyspnea    Dysrhythmia    GERD (gastroesophageal reflux disease)    Hyperlipidemia    Hypertension    Hypothyroidism    sees Dr. Elyse Hsu   Insomnia    Migraine syndrome    sees Dr. 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 08/2017   Stroke ([place]) 03/25/2016   left MCA    Stroke [facility]) 04/2016   sees Dr. Teodora Medici at [place]  Adopted: Yes  Problem Relation Age of Onset   Heart disease Other        on both sides of family   Diabetes Other        on both sides of family    Past Surgical History:  Procedure Laterality Date   blocked itestinal repair  73   age 16 months   BREAST BIOPSY Left 2017   CHOLECYSTECTOMY  2011   INDUCED ABORTION  1996   forced abortion   INGUINAL HERNIA REPAIR Left 1980   age 36   LAPAROSCOPIC ENDOMETRIOSIS FULGURATION  1998   TOTAL KNEE ARTHROPLASTY Right 07/05/2021   Procedure: TOTAL KNEE ARTHROPLASTY;  Surgeon: Meredith Pel, MD;  Location: [place];  Service: Orthopedics;  Laterality: Right;   WISDOM TOOTH EXTRACTION  2007   Social History   Occupational History   Occupation: Nurse, learning disability  Tobacco Use   Smoking status: Every Day    Packs/day: 0.25    Years: 0.50    Pack years: 0.13    Types: Cigarettes    Start date: 02/24/2018   Smokeless tobacco: Never   Tobacco comments:    has decreased how much - trying to quit  Vaping Use   Vaping Use: Former  Substance and Sexual Activity   Alcohol use: Yes    Comment: rarely   Drug use: Yes    Types: Marijuana    Comment: 2/10 last marijuana use   Sexual activity: Not on file

## 2021-08-02 ENCOUNTER — Encounter: Payer: Self-pay | Admitting: Physical Therapy

## 2021-08-02 ENCOUNTER — Other Ambulatory Visit: Payer: Self-pay

## 2021-08-02 ENCOUNTER — Ambulatory Visit: Payer: BC Managed Care – PPO | Attending: Orthopedic Surgery | Admitting: Physical Therapy

## 2021-08-02 DIAGNOSIS — R2689 Other abnormalities of gait and mobility: Secondary | ICD-10-CM | POA: Insufficient documentation

## 2021-08-02 DIAGNOSIS — M25661 Stiffness of right knee, not elsewhere classified: Secondary | ICD-10-CM | POA: Diagnosis present

## 2021-08-02 DIAGNOSIS — M25561 Pain in right knee: Secondary | ICD-10-CM | POA: Diagnosis not present

## 2021-08-02 DIAGNOSIS — M6281 Muscle weakness (generalized): Secondary | ICD-10-CM | POA: Insufficient documentation

## 2021-08-02 DIAGNOSIS — R600 Localized edema: Secondary | ICD-10-CM | POA: Diagnosis present

## 2021-08-02 NOTE — Therapy (Signed)
[facility] [address]  [place], [place], [address] Phone: [phone]   Fax:  [phone]  Physical Therapy Evaluation  Patient Details  Name: Miranda [name] MRN: [id number] Date of Birth: 25-Feb-1976 Referring Provider (PT): Marcene Duos   Encounter Date: 08/02/2021   PT End of Session - 08/02/21 1052     Visit Number 1    Number of Visits 16    Date for PT Re-Evaluation 09/27/21    Authorization Type BCBS    Authorization - Visit Number 60    Progress Note Due on Visit 10    PT Start Time 0850    PT Stop Time 0930    PT Time Calculation (min) 40 min    Activity Tolerance Patient tolerated treatment well;Patient limited by pain    Behavior During Therapy [facility] for tasks assessed/performed             Past Medical History:  Diagnosis Date   Anginal pain ([place])    Anxiety    Arthritis    Asthma    Bowel obstruction ([place])    Cardiac arrhythmia    Depression    sees Dr. Matilde Haymaker in [place]    Diabetes mellitus    sees Dr. Madelin Rear    Dyspnea    Dysrhythmia    GERD (gastroesophageal reflux disease)    Hyperlipidemia    Hypertension    Hypothyroidism    sees Dr. Elyse Hsu   Insomnia    Migraine syndrome    sees Dr. 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 08/2017   Stroke ([place]) 03/25/2016   left MCA    Stroke [facility]) 04/2016   sees Dr. Teodora Medici at [facility]     Past Surgical History:  Procedure Laterality Date   blocked itestinal repair  1975   age 80 months   BREAST BIOPSY Left 2017   CHOLECYSTECTOMY  2011   INDUCED ABORTION  1996   forced abortion   INGUINAL HERNIA REPAIR Left 1980   age 29   LAPAROSCOPIC ENDOMETRIOSIS FULGURATION  1998    TOTAL KNEE ARTHROPLASTY Right 07/05/2021   Procedure: TOTAL KNEE ARTHROPLASTY;  Surgeon: Meredith Pel, MD;  Location: [facility];  Service: Orthopedics;  Laterality: Right;   WISDOM TOOTH EXTRACTION  2007    There were no vitals filed for this visit.    Subjective Assessment - 08/02/21 0904     Subjective Pt. had R TKA on 07/05/21.  She reports prior to surgery she was very active, but limited due to bil knee pain.  She plans to have L knee replacement as well in the future.  She had HHPT for 6 visits, and has been using CPM machine daily.  She reports sitting in recliner for prolonged periods for work, currently working at home on [employer].  She reports a lot of pain, numbness and sensitivity in her R knee around incision and shin.    Pertinent History depression, PTSD,migraine/ TIA (loss of sensation on L side), L ischemic stroke, T2DM, irregular heartbeat, gastroparesis, IBS, asthma, GERD    Limitations Sitting;Standing;Walking;House hold activities    How long can you sit comfortably? 5 minutes  if not in lazy boy    How long can you stand comfortably? a while, more comfortable than sitting, 10-15 min    How long can you walk comfortably? most comfortable, 20 min    Diagnostic tests X-ray    Patient Stated Goals normal function on knee, get it strainght and get it bent    Currently in Pain? Yes    Pain Score 5    at worst 7/10 has to take pain pill   Pain Location Knee    Pain Orientation Right    Pain Descriptors / Indicators Aching;Operative site guarding;Numbness    Pain Type Surgical pain    Pain Onset 1 to 4 weeks ago    Pain Frequency Constant    Aggravating Factors  sitting prolonged periods    Pain Relieving Factors moving    Effect of Pain on Daily Activities unable to do most activities                [facility] PT Assessment - 08/02/21 0001       Assessment   Medical Diagnosis Z98.890 (ICD-10-CM) - Post-operative state R TKA    Referring Provider (PT) Marcene Duos    Onset Date/Surgical Date 07/05/21    Hand Dominance Right    Next MD Visit 08/17/2021    Prior Therapy HHPT - 6  visits      Precautions   Precautions None      Restrictions   Weight Bearing Restrictions No      Balance Screen   Has the patient fallen in the past 6 months Yes    How many times? 1    Has the patient had a decrease in activity level because of a fear of falling?  No    Is the patient reluctant to leave their home because of a fear of falling?  No      Home Environment   Living Environment Private residence    Living Arrangements Non-relatives/Friends    Type of [place] to enter    Entrance Stairs-Number of Steps 5    Entrance Stairs-Rails Right;Left;Cannot reach both    [place] to live on main level with bedroom/bathroom    [place] - 2 wheels;Cane - single point;Bedside commode;Shower seat      Prior Function   Level of Independence Independent    Vocation Full time employment   FMLA   Vocation Requirements work from home, sit for prolonged periods    Leisure hiking, backpacking, Optician, dispensing   Overall Cognitive Status Within Functional Limits for tasks assessed      Observation/Other Assessments   Observations enters slowly with 2WRW, difficulty with transfers, uncomfortable with sitting in chair,shifting position.  R knee appears straight, increased varum L knee.    Skin Integrity healing incision over R knee, bruising in R shin and lateral leg    Focus on Therapeutic Outcomes (FOTO)  knee 31, risk adjusted 26.  After 17 visits predicted outcome is 55.      Sensation   Light Touch Impaired by gross assessment    Additional Comments decreased sensation around incision.  also reports sensation impairments L side of body      Functional Tests   Functional tests Sit to Stand      Sit to Stand   Comments 5x sit to stand 24 seconds.  Weight shifts to LLE to transition.  ROM /  Strength   AROM / PROM / Strength AROM;PROM;Strength      AROM   Overall AROM  Deficits    Overall AROM Comments tested in sitting    AROM Assessment Site Knee    Right/Left Knee Right    Right Knee Extension -20   in sitting   Right Knee Flexion 72   in sitting     PROM   Overall PROM  Deficits    Overall PROM Comments tested in supine    PROM Assessment Site Knee    Right/Left Knee Right    Right Knee Extension -20   did not apply overpressure due to pain level   Right Knee Flexion 80   significant increase in pain     Strength   Overall Strength Deficits    Overall Strength Comments tested in sitting    Strength Assessment Site Hip;Knee;Ankle    Right/Left Hip Right;Left    Right Hip Flexion 4-/5    Right Hip ABduction 4+/5    Right Hip ADduction 4+/5    Left Hip Flexion 5/5    Left Hip ABduction 5/5    Left Hip ADduction 5/5    Right/Left Knee Right;Left    Right Knee Flexion 3-/5    Right Knee Extension 4-/5    Left Knee Flexion 5/5    Left Knee Extension 5/5    Right/Left Ankle Right;Left    Right Ankle Dorsiflexion 4/5    Left Ankle Dorsiflexion 5/5      Palpation   Patella mobility decreased      Transfers   Five time sit to stand comments  24 seconds without hands, using LLE more keeping RLE foward      Ambulation/Gait   Ambulation/Gait Yes    Ambulation/Gait Assistance 6: Modified independent (Device/Increase time)    Ambulation Distance (Feet) 50 Feet    Assistive device Rolling walker    Gait Pattern Step-to pattern;Antalgic    Gait velocity --   very slow, not formally measured today                       Objective measurements completed on examination: See above findings.       [facility] Adult PT Treatment/Exercise - 08/02/21 0001       Self-Care   Self-Care Scar Mobilizations;Other Self-Care Comments    Scar Mobilizations R knee    Other Self-Care Comments  patellar mobilization      Exercises   Exercises Knee/Hip       Knee/Hip Exercises: Standing   Other Standing Knee Exercises retro stepping with walker x 10      Knee/Hip Exercises: Seated   Sit to Sand 5 reps   staggered stance with RLE back                    PT Education - 08/02/21 1036     Education Details progressed HEP including scar tissue massage.  Access Code: [email or web address]    Person(s) Educated Patient    Methods Explanation;Demonstration;Verbal cues;Handout    Comprehension Verbalized understanding;Returned demonstration              PT Short Term Goals - 08/02/21 1100       PT SHORT TERM GOAL #1   Title Ind with initial HEP    Time 2    Period Weeks    Status New    Target Date 08/16/21  PT Long Term Goals - 08/02/21 1100       PT LONG TERM GOAL #1   Title Ind with progressed HEP for LE strengthening/gait    Time 8    Period Weeks    Status New    Target Date 09/27/21      PT LONG TERM GOAL #2   Title Pt. will improve R knee extension to no more than lacking 5 deg full extension for safety with gait.    Baseline lacking 20 deg R knee extension.    Time 8    Period Weeks    Status New    Target Date 09/27/21      PT LONG TERM GOAL #3   Title Pt. will demonstrate 115 deg R knee flexion to be able to perform activities safely    Baseline 80 deg R knee flexion    Time 8    Period Weeks    Status New    Target Date 09/27/21      PT LONG TERM GOAL #4   Title Pt. will be able to ambulate 1000' without AD safely without limitations from pain to access community.    Baseline 2WRW, slow antalgic gait    Time 8    Period Weeks    Status New    Target Date 09/27/21      PT LONG TERM GOAL #5   Title Patient will demonstrate improved functional LE strength by completing 5xSTS < 14 seconds.    Baseline 24 seconds.    Time 8    Period Weeks    Status New    Target Date 09/27/21      Additional Long Term Goals   Additional Long Term Goals Yes      PT LONG TERM GOAL #6   Title Pt.  will report no more than 2/10 R knee pain with activity.    Baseline 5-8/10 depending on activity    Time 8    Period Weeks    Status New    Target Date 09/27/21                    Plan - 08/02/21 1053     Clinical Impression Statement Azaela Caracci is a 45 year old female referred to PT following R TKA on 07/05/21.  She has had 6 visits for home health and reports good compliance with current HEP.  She demonstrates significant limitations in R knee ROM, with AROM 20-72, and only 80 deg knee flexion with overpressure.  She also demonstrates weakness in her RLE.   She also demonstrates impairments in gait, R knee pain and swelling and difficulty with ADLs. She would benefit from skilled physical therapy to decrease pain, improve R knee ROM and strength, and allow return to desired activities.    Personal Factors and Comorbidities Comorbidity 3+    Comorbidities depression, PTSD, obesity, TIA/migraine, T2DM, hypertension, HT, gastroparesis, IBS, asthma, GERD, L knee OA, history ischemic stroke, heart irregularities    Examination-Activity Limitations Stairs;Stand;Sit;Transfers;Locomotion Level;Carry;Bend;Squat;Toileting    Examination-Participation Restrictions Cleaning;Meal Prep;Community Activity;Laundry;Occupation    Stability/Clinical Decision Making Evolving/Moderate complexity    Clinical Decision Making Moderate    Rehab Potential Good    PT Frequency 2x / week    PT Duration 8 weeks    PT Treatment/Interventions ADLs/Self Care Home Management;Cryotherapy;Electrical Stimulation;Iontophoresis 4mg /ml Dexamethasone;Moist Heat;Ultrasound;Gait training;Stair training;Functional mobility training;Therapeutic activities;Therapeutic exercise;Balance training;Neuromuscular re-education;Patient/family education;Manual techniques;Scar mobilization;Passive range of motion;Dry needling;Taping;Vasopneumatic Device;Joint Manipulations    PT Next  Visit Plan review and progress exercises to  include standing exercises, progress ROM R knee    PT Home Exercise Plan Access Code: [email or web address]    Consulted and Agree with Plan of Care Patient             Patient will benefit from skilled therapeutic intervention in order to improve the following deficits and impairments:  Abnormal gait, Decreased activity tolerance, Decreased endurance, Decreased range of motion, Decreased skin integrity, Decreased strength, Hypomobility, Increased fascial restricitons, Impaired sensation, Pain, Decreased mobility, Decreased scar mobility, Difficulty walking, Increased edema, Increased muscle spasms, Impaired flexibility  Visit Diagnosis: Acute pain of right knee  Stiffness of right knee, not elsewhere classified  Other abnormalities of gait and mobility  Muscle weakness (generalized)  Localized edema     Problem List Patient Active Problem List   Diagnosis Date Noted   Arthritis of right knee    Diabetic gastroparesis ([place]) 07/20/2021   Constipation 07/20/2021   S/P total knee arthroplasty, right 07/05/2021   Chronic diarrhea 04/29/2021   Belching 04/29/2021   Primary osteoarthritis of both knees 10/28/2020   Irritable bowel syndrome with diarrhea 10/16/2019   Tendinitis of right rotator cuff 09/17/2019   Hand pain, right 05/21/2019   Labral tear of hip joint 05/21/2019   Type 2 diabetes mellitus with diabetic neuropathy, unspecified (HCC) 10/01/2018   Right hip pain 05/22/2018   [place] Must Quervain's disease (radial styloid tenosynovitis) 04/02/2018   Carpal tunnel syndrome, right upper limb 12/31/2017   Elbow injury, right, initial encounter 12/16/2017   Chronic pain of both shoulders 11/30/2017   PTSD (post-traumatic stress disorder) 10/25/2016   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) 10/25/2016   Major depressive disorder, recurrent episode, severe, with psychosis ([place]) 10/20/2016   Major depressive disorder, recurrent episode, severe, with  psychotic behavior ([place]) 10/19/2016   TIA (transient ischemic attack) 05/16/2016   Arterial ischemic stroke, MCA, left, acute ([place]) 03/29/2016   Plantar fasciitis 08/08/2013   Obesity, Class III, BMI 40-49.9 (morbid obesity) ([place]) 04/09/2013   Unspecified sleep apnea 03/31/2013   PCO (polycystic ovaries) 04/11/2012   Neck pain 11/21/2010   FATTY LIVER DISEASE 01/26/2010   NAUSEA WITH VOMITING 01/26/2010   PORTAL HYPERTENSION 01/26/2010   Migraine headache 06/28/2009   BREAST MASS 08/21/2008   KNEE PAIN 04/14/2008   Hypothyroidism 03/17/2008   Hyperlipidemia 03/17/2008   DEPRESSION 03/17/2008   NEUROPATHY 03/17/2008   Essential hypertension 03/17/2008   ASTHMA 03/17/2008   GERD 03/17/2008   Headache 03/17/2008    Rennie Natter, PT, DPT 08/02/2021, 11:07 AM  [facility] [address]  [place], [place], [address] Phone: [phone]   Fax:  [phone]  Name: ANNIYAH MOOD MRN: [id number] Date of Birth: 1976-10-12

## 2021-08-02 NOTE — Patient Instructions (Signed)
Access Code: [email or web address] URL: [email or web address] Date: 08/02/2021 Prepared by: Glenetta Hew  Exercises Backward Weight Shift and Opposite Arm Raise with Walker - 3 x daily - 7 x weekly - 2 sets - 10 reps 4 Way Patellar Glide - 2 x daily - 7 x weekly - 1 sets - 5 reps Prone Knee Extension with Ankle Weight - 1 x daily - 7 x weekly - 5 min hold Staggered Sit-to-Stand - 3 x daily - 7 x weekly - 1 sets - 10 reps  Patient Education Scar Massage

## 2021-08-04 ENCOUNTER — Ambulatory Visit: Payer: BC Managed Care – PPO

## 2021-08-04 ENCOUNTER — Other Ambulatory Visit: Payer: Self-pay

## 2021-08-04 DIAGNOSIS — M6281 Muscle weakness (generalized): Secondary | ICD-10-CM

## 2021-08-04 DIAGNOSIS — M25561 Pain in right knee: Secondary | ICD-10-CM | POA: Diagnosis not present

## 2021-08-04 DIAGNOSIS — M25661 Stiffness of right knee, not elsewhere classified: Secondary | ICD-10-CM

## 2021-08-04 DIAGNOSIS — R2689 Other abnormalities of gait and mobility: Secondary | ICD-10-CM

## 2021-08-04 DIAGNOSIS — R600 Localized edema: Secondary | ICD-10-CM

## 2021-08-04 NOTE — Therapy (Signed)
[facility] [address]  [place], [place], [address] Phone: [phone]   Fax:  [phone]  Physical Therapy Treatment  Patient Details  Name: Miranda [name] MRN: [id number] Date of Birth: 1975-12-23 Referring Provider (PT): Marcene Duos   Encounter Date: 08/04/2021   PT End of Session - 08/04/21 1502     Visit Number 2    Number of Visits 16    Date for PT Re-Evaluation 09/27/21    Authorization Type BCBS    Authorization - Visit Number --    Progress Note Due on Visit 10    PT Start Time 5465    PT Stop Time 1453    PT Time Calculation (min) 48 min    Activity Tolerance Patient tolerated treatment well;Patient limited by pain    Behavior During Therapy [facility] for tasks assessed/performed             Past Medical History:  Diagnosis Date   Anginal pain ([place])    Anxiety    Arthritis    Asthma    Bowel obstruction ([place])    Cardiac arrhythmia    Depression    sees Dr. Matilde Haymaker in [place]    Diabetes mellitus    sees Dr. Madelin Rear    Dyspnea    Dysrhythmia    GERD (gastroesophageal reflux disease)    Hyperlipidemia    Hypertension    Hypothyroidism    sees Dr. Elyse Hsu   Insomnia    Migraine syndrome    sees Dr. 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 08/2017   Stroke ([place]) 03/25/2016   left MCA    Stroke [facility]) 04/2016   sees Dr. Teodora Medici at [facility]     Past Surgical History:  Procedure Laterality Date   blocked itestinal repair  1975   age 102 months   BREAST BIOPSY Left 2017   CHOLECYSTECTOMY  2011   INDUCED ABORTION  1996   forced abortion   INGUINAL HERNIA REPAIR Left 1980   age 16   LAPAROSCOPIC ENDOMETRIOSIS FULGURATION  1998    TOTAL KNEE ARTHROPLASTY Right 07/05/2021   Procedure: TOTAL KNEE ARTHROPLASTY;  Surgeon: Meredith Pel, MD;  Location: [facility];  Service: Orthopedics;  Laterality: Right;   WISDOM TOOTH EXTRACTION  2007    There were no vitals filed for this visit.   Subjective Assessment - 08/04/21 1408     Subjective Pt notes that last night her knee increased in pain from being in an akward position. Notes increased knee pain but feels like it is from working her knee more now.    Pertinent History depression, PTSD,migraine/ TIA (loss of sensation on L side), L ischemic stroke, T2DM, irregular heartbeat, gastroparesis, IBS, asthma, GERD    Diagnostic tests X-ray    Patient Stated Goals normal function on knee, get it strainght and get it bent    Currently in Pain? Yes    Pain Score 6     Pain Location Knee    Pain Orientation Right    Pain Descriptors / Indicators Tightness;Aching    Pain Type Acute pain;Surgical pain  [facility] PT Assessment - 08/04/21 0001       AROM   Right Knee Extension 9   supine   Right Knee Flexion --   73 supine; 77 sitting                          [facility] Adult PT Treatment/Exercise - 08/04/21 0001       Exercises   Exercises Knee/Hip      Knee/Hip Exercises: Stretches   Passive Hamstring Stretch Right;20 seconds;4 reps    Passive Hamstring Stretch Limitations manual stretch in supine    Gastroc Stretch Right;2 reps;20 seconds    Gastroc Stretch Limitations runnner stretch      Knee/Hip Exercises: Aerobic   Nustep L2x16min      Knee/Hip Exercises: Standing   Terminal Knee Extension Strengthening;Right;10 reps    Terminal Knee Extension Limitations pushing into ball on wall    Other Standing Knee Exercises church pews 20x    Other Standing Knee Exercises retro steps with counter support 2x10      Knee/Hip Exercises: Supine   Heel Slides AAROM;Right;10 reps    Heel Slides Limitations with strap and feet on peanut ball       Manual Therapy   Manual Therapy Passive ROM    Passive ROM knee flexion stretch in supine and HS stretch                    PT Education - 08/04/21 1501     Education Details edu on importance of scar mobilizations and patellar mobs    Person(s) Educated Patient    Methods Explanation;Demonstration    Comprehension Verbalized understanding;Returned demonstration              PT Short Term Goals - 08/04/21 1510       PT SHORT TERM GOAL #1   Title Ind with initial HEP    Time 2    Period Weeks    Status On-going    Target Date 08/16/21               PT Long Term Goals - 08/04/21 1510       PT LONG TERM GOAL #1   Title Ind with progressed HEP for LE strengthening/gait    Time 8    Period Weeks    Status On-going      PT LONG TERM GOAL #2   Title Pt. will improve R knee extension to no more than lacking 5 deg full extension for safety with gait.    Baseline lacking 20 deg R knee extension.    Time 8    Period Weeks    Status On-going      PT LONG TERM GOAL #3   Title Pt. will demonstrate 115 deg R knee flexion to be able to perform activities safely    Baseline 80 deg R knee flexion    Time 8    Period Weeks    Status On-going      PT LONG TERM GOAL #4   Title Pt. will be able to ambulate 1000' without AD safely without limitations from pain to access community.    Baseline 2WRW, slow antalgic gait    Time 8    Period Weeks    Status On-going      PT LONG TERM GOAL #5   Title Patient will demonstrate improved functional LE strength by completing 5xSTS < 14 seconds.  Baseline 24 seconds.    Time 8    Period Weeks    Status On-going      PT LONG TERM GOAL #6   Title Pt. will report no more than 2/10 R knee pain with activity.    Baseline 5-8/10 depending on activity    Time 8    Period Weeks    Status On-going                   Plan - 08/04/21 1504     Clinical Impression Statement Pt today demonstrated a good  capacity for ther ex and decent quad activation during TKE. She came into clinic showing decreased stride length and guarded trunk movements ambulating with SPC. After the church pew and retro stepping exercises she demonstrated increased stride length. Cues needed to avoid excessive ER with hips during standing exercises. We were able to increase R knee ROM with manual stretches, also reinforced the importance of scar mobilizations to her to prevent further knee stiffness. She would continue to benefit from PT to improve her functional ability.    Personal Factors and Comorbidities Comorbidity 3+    Comorbidities depression, PTSD, obesity, TIA/migraine, T2DM, hypertension, HT, gastroparesis, IBS, asthma, GERD, L knee OA, history ischemic stroke, heart irregularities    Examination-Activity Limitations Stairs;Stand;Sit;Transfers;Locomotion Level;Carry;Bend;Squat;Toileting    Examination-Participation Restrictions Cleaning;Meal Prep;Community Activity;Laundry;Occupation    Stability/Clinical Decision Making Evolving/Moderate complexity    Rehab Potential Good    PT Frequency 2x / week    PT Duration 8 weeks    PT Treatment/Interventions ADLs/Self Care Home Management;Cryotherapy;Electrical Stimulation;Iontophoresis 4mg /ml Dexamethasone;Moist Heat;Ultrasound;Gait training;Stair training;Functional mobility training;Therapeutic activities;Therapeutic exercise;Balance training;Neuromuscular re-education;Patient/family education;Manual techniques;Scar mobilization;Passive range of motion;Dry needling;Taping;Vasopneumatic Device;Joint Manipulations    PT Next Visit Plan review and progress exercises to include standing exercises, progress ROM R knee    PT Home Exercise Plan Access Code: [email or web address]    Consulted and Agree with Plan of Care Patient             Patient will benefit from skilled therapeutic intervention in order to improve the following deficits and impairments:  Abnormal gait, Decreased  activity tolerance, Decreased endurance, Decreased range of motion, Decreased skin integrity, Decreased strength, Hypomobility, Increased fascial restricitons, Impaired sensation, Pain, Decreased mobility, Decreased scar mobility, Difficulty walking, Increased edema, Increased muscle spasms, Impaired flexibility  Visit Diagnosis: Acute pain of right knee  Stiffness of right knee, not elsewhere classified  Other abnormalities of gait and mobility  Muscle weakness (generalized)  Localized edema     Problem List Patient Active Problem List   Diagnosis Date Noted   Arthritis of right knee    Diabetic gastroparesis ([place]) 07/20/2021   Constipation 07/20/2021   S/P total knee arthroplasty, right 07/05/2021   Chronic diarrhea 04/29/2021   Belching 04/29/2021   Primary osteoarthritis of both knees 10/28/2020   Irritable bowel syndrome with diarrhea 10/16/2019   Tendinitis of right rotator cuff 09/17/2019   Hand pain, right 05/21/2019   Labral tear of hip joint 05/21/2019   Type 2 diabetes mellitus with diabetic neuropathy, unspecified ([place]) 10/01/2018   Right hip pain 05/22/2018   Tennis Must Quervain's disease (radial styloid tenosynovitis) 04/02/2018   Carpal tunnel syndrome, right upper limb 12/31/2017   Elbow injury, right, initial encounter 12/16/2017   Chronic pain of both shoulders 11/30/2017   PTSD (post-traumatic stress disorder) 10/25/2016   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[place]) 10/25/2016   Diabetes mellitus ([place]) 10/25/2016   Major depressive disorder, recurrent episode, severe, with psychosis ([place])  10/20/2016   Major depressive disorder, recurrent episode, severe, 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[place]) 10/19/2016   TIA (transient ischemic attack) 05/16/2016   Arterial ischemic stroke, MCA, left, acute ([place]) 03/29/2016   Plantar fasciitis 08/08/2013   Obesity, Class III, BMI 40-49.9 (morbid obesity) ([place]) 04/09/2013   Unspecified sleep apnea 03/31/2013   PCO (polycystic ovaries)  04/11/2012   Neck pain 11/21/2010   FATTY LIVER DISEASE 01/26/2010   NAUSEA WITH VOMITING 01/26/2010   PORTAL HYPERTENSION 01/26/2010   Migraine headache 06/28/2009   BREAST MASS 08/21/2008   KNEE PAIN 04/14/2008   Hypothyroidism 03/17/2008   Hyperlipidemia 03/17/2008   DEPRESSION 03/17/2008   NEUROPATHY 03/17/2008   Essential hypertension 03/17/2008   ASTHMA 03/17/2008   GERD 03/17/2008   Headache 03/17/2008    Artist Pais, PTA 08/04/2021, 3:12 PM  [facility] [address]  [place], [place], [address] Phone: [phone]   Fax:  [phone]  Name: DONDI BURANDT MRN: [id number] Date of Birth: 08-26-1976

## 2021-08-09 ENCOUNTER — Ambulatory Visit: Payer: BC Managed Care – PPO | Admitting: Physical Therapy

## 2021-08-09 ENCOUNTER — Encounter: Payer: Self-pay | Admitting: Physical Therapy

## 2021-08-09 ENCOUNTER — Other Ambulatory Visit: Payer: Self-pay

## 2021-08-09 DIAGNOSIS — M25561 Pain in right knee: Secondary | ICD-10-CM

## 2021-08-09 DIAGNOSIS — M25661 Stiffness of right knee, not elsewhere classified: Secondary | ICD-10-CM

## 2021-08-09 DIAGNOSIS — R600 Localized edema: Secondary | ICD-10-CM

## 2021-08-09 DIAGNOSIS — R2689 Other abnormalities of gait and mobility: Secondary | ICD-10-CM

## 2021-08-09 DIAGNOSIS — M6281 Muscle weakness (generalized): Secondary | ICD-10-CM

## 2021-08-09 NOTE — Therapy (Addendum)
[facility] [address]  [place], [place], [address] Phone: [phone]   Fax:  [phone]  Physical Therapy Treatment  Patient Details  Name: Miranda [name] MRN: [id number] Date of Birth: [birth year] Referring Provider (PT): Marcene Duos   Encounter Date: 08/09/2021   PT End of Session - 08/09/21 1816     Visit Number 3    Number of Visits 16    Date for PT Re-Evaluation 09/27/21    Authorization Type BCBS    Progress Note Due on Visit 10    PT Start Time 1447    PT Stop Time 1540    PT Time Calculation (min) 53 min    Activity Tolerance Patient limited by pain    Behavior During Therapy [facility] for tasks assessed/performed             Past Medical History:  Diagnosis Date   Anginal pain ([place])    Anxiety    Arthritis    Asthma    Bowel obstruction ([place])    Cardiac arrhythmia    Depression    sees Dr. Matilde Haymaker in [place]    Diabetes mellitus    sees Dr. Madelin Rear    Dyspnea    Dysrhythmia    GERD (gastroesophageal reflux disease)    Hyperlipidemia    Hypertension    Hypothyroidism    sees Dr. Elyse Hsu   Insomnia    Migraine syndrome    sees Dr. 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 08/2017   Stroke ([place]) 03/25/2016   left MCA    Stroke [facility]) 04/2016   se[facility] Dr. Teodora Medici at [facility]     Past Surgical History:  Procedure Laterality Date   blocked itestinal repair  1975   age 71 months   BREAST BIOPSY Left 2017   CHOLECYSTECTOMY  2011   INDUCED ABORTION  1996   forced abortion   INGUINAL HERNIA REPAIR Left 1980   age 28   LAPAROSCOPIC ENDOMETRIOSIS FULGURATION  1998   TOTAL KNEE ARTHROPLASTY Right 07/05/2021   Procedure: TOTAL KNEE  ARTHROPLASTY;  Surgeon: Meredith Pel, MD;  Location: [place];  Service: Orthopedics;  Laterality: Right;   WISDOM TOOTH EXTRACTION  2007    There were no vitals filed for this visit.   Subjective Assessment - 08/09/21 1813     Subjective Patient reports that she is very frustrated with her R knee, it has not been bending well and is very painful.  Trying to do exercise also aggravates her L knee.    Pertinent History depression, PTSD,migraine/ TIA (loss of sensation on L side), L ischemic stroke, T2DM, irregular heartbeat, gastroparesis, IBS, asthma, GERD    Diagnostic tests X-ray    Patient Stated Goals normal function on knee, get it strainght and get it bent    Currently in Pain? Yes    Pain Score 8     Pain Location Knee    Pain Orientation Right    Pain Descriptors / Indicators Aching;Tender  [facility] Adult PT Treatment/Exercise - 08/09/21 0001       Exercises   Exercises Knee/Hip      Knee/Hip Exercises: Stretches   Quad Stretch Right    Quad Stretch Limitations in prone with hip extended    Hip Flexor Stretch Right    Hip Flexor Stretch Limitations in prone with hip extended      Knee/Hip Exercises: Aerobic   Stationary Bike partial revolutions x 6 min      Knee/Hip Exercises: Supine   Short Arc Quad Sets Strengthening;Right;2 sets;10 reps    Short Arc Quad Sets Limitations knee on ball    Heel Slides AAROM;Right;10 reps    Heel Slides Limitations with strap and feet on towel with PT assist      Knee/Hip Exercises: Prone   Hamstring Curl 10 reps    Hamstring Curl Limitations contract relax    Hip Extension Right;10 reps    Contract/Relax to Increase Flexion 6 x 6 sec holds      Modalities   Modalities Vasopneumatic      Vasopneumatic   Number Minutes Vasopneumatic  10 minutes    Vasopnuematic Location  Knee   right   Vasopneumatic Pressure Low    Vasopneumatic Temperature  34      Manual Therapy    Manual Therapy Passive ROM;Joint mobilization    Manual therapy comments poorly tolerated    Joint Mobilization seated knee flexion with traction and IR to improve tolerance to glide, patellar mobs    Passive ROM knee flexion stretch in prone                       PT Short Term Goals - 08/04/21 1510       PT SHORT TERM GOAL #1   Title Ind with initial HEP    Time 2    Period Weeks    Status On-going    Target Date 08/16/21               PT Long Term Goals - 08/04/21 1510       PT LONG TERM GOAL #1   Title Ind with progressed HEP for LE strengthening/gait    Time 8    Period Weeks    Status On-going      PT LONG TERM GOAL #2   Title Pt. will improve R knee extension to no more than lacking 5 deg full extension for safety with gait.    Baseline lacking 20 deg R knee extension.    Time 8    Period Weeks    Status On-going      PT LONG TERM GOAL #3   Title Pt. will demonstrate 115 deg R knee flexion to be able to perform activities safely    Baseline 80 deg R knee flexion    Time 8    Period Weeks    Status On-going      PT LONG TERM GOAL #4   Title Pt. will be able to ambulate 1000' without AD safely without limitations from pain to access community.    Baseline 2WRW, slow antalgic gait    Time 8    Period Weeks    Status On-going      PT LONG TERM GOAL #5   Title Patient will demonstrate improved functional LE strength by completing 5xSTS < 14 seconds.    Baseline 24 seconds.    Time 8    Period Weeks    Status  On-going      PT LONG TERM GOAL #6   Title Pt. will report no more than 2/10 R knee pain with activity.    Baseline 5-8/10 depending on activity    Time 8    Period Weeks    Status On-going                   Plan - 08/09/21 1816     Clinical Impression Statement Patient was extremely painful today, and had difficulty tolerating manual therapy to improve R knee ROM and reporting increased pain with most exercises as  well.  She did not demonstrate any redness or signs of infection around incision, but did have a lot of swelling.  Formal measurements not taken today but knee extension has improved compared to initial evaluation, not able to tolerate knee flexion to end range.  Pt. would benefit from continued skilled therapy.    Personal Factors and Comorbidities Comorbidity 3+    Comorbidities depression, PTSD, obesity, TIA/migraine, T2DM, hypertension, HT, gastroparesis, IBS, asthma, GERD, L knee OA, history ischemic stroke, heart irregularities    Examination-Activity Limitations Stairs;Stand;Sit;Transfers;Locomotion Level;Carry;Bend;Squat;Toileting    Examination-Participation Restrictions Cleaning;Meal Prep;Community Activity;Laundry;Occupation    Stability/Clinical Decision Making Evolving/Moderate complexity    Rehab Potential Good    PT Frequency 2x / week    PT Duration 8 weeks    PT Treatment/Interventions ADLs/Self Care Home Management;Cryotherapy;Electrical Stimulation;Iontophoresis 4mg /ml Dexamethasone;Moist Heat;Ultrasound;Gait training;Stair training;Functional mobility training;Therapeutic activities;Therapeutic exercise;Balance training;Neuromuscular re-education;Patient/family education;Manual techniques;Scar mobilization;Passive range of motion;Dry needling;Taping;Vasopneumatic Device;Joint Manipulations    PT Next Visit Plan review and progress exercises to include standing exercises, progress ROM R knee    PT Home Exercise Plan Access Code: [email or web address]    Consulted and Agree with Plan of Care Patient             Patient will benefit from skilled therapeutic intervention in order to improve the following deficits and impairments:  Abnormal gait, Decreased activity tolerance, Decreased endurance, Decreased range of motion, Decreased skin integrity, Decreased strength, Hypomobility, Increased fascial restricitons, Impaired sensation, Pain, Decreased mobility, Decreased scar mobility, Difficulty  walking, Increased edema, Increased muscle spasms, Impaired flexibility  Visit Diagnosis: Acute pain of right knee  Stiffness of right knee, not elsewhere classified  Other abnormalities of gait and mobility  Muscle weakness (generalized)  Localized edema     Problem List Patient Active Problem List   Diagnosis Date Noted   Arthritis of right knee    Diabetic gastroparesis ([place]) 07/20/2021   Constipation 07/20/2021   S/P total knee arthroplasty, right 07/05/2021   Chronic diarrhea 04/29/2021   Belching 04/29/2021   Primary osteoarthritis of both knees 10/28/2020   Irritable bowel syndrome with diarrhea 10/16/2019   Tendinitis of right rotator cuff 09/17/2019   Hand pain, right 05/21/2019   Labral tear of hip joint 05/21/2019   Type 2 diabetes mellitus with diabetic neuropathy, unspecified ([place]) 10/01/2018   Right hip pain 05/22/2018   Tennis Must Quervain's disease (radial styloid tenosynovitis) 04/02/2018   Carpal tunnel syndrome, right upper limb 12/31/2017   Elbow injury, right, initial encounter 12/16/2017   Chronic pain of both shoulders 11/30/2017   PTSD (post-traumatic stress disorder) 10/25/2016   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[place]) 10/25/2016   Diabetes mellitus ([place]) 10/25/2016   Major depressive disorder, recurrent episode, severe, with psychosis ([place]) 10/20/2016   Major depressive disorder, recurrent episode, severe, 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[place]) 10/19/2016   TIA (transient ischemic attack) 05/16/2016   Arterial ischemic stroke, MCA, left, acute ([place]) 03/29/2016  Plantar fasciitis 08/08/2013   Obesity, Class III, BMI 40-49.9 (morbid obesity) ([place]) 04/09/2013   Unspecified sleep apnea 03/31/2013   PCO (polycystic ovaries) 04/11/2012   Neck pain 11/21/2010   FATTY LIVER DISEASE 01/26/2010   NAUSEA WITH VOMITING 01/26/2010   PORTAL HYPERTENSION 01/26/2010   Migraine headache 06/28/2009   BREAST MASS 08/21/2008   KNEE PAIN 04/14/2008   Hypothyroidism 03/17/2008    Hyperlipidemia 03/17/2008   DEPRESSION 03/17/2008   NEUROPATHY 03/17/2008   Essential hypertension 03/17/2008   ASTHMA 03/17/2008   GERD 03/17/2008   Headache 03/17/2008    Rennie Natter, PT, DPT 08/09/2021, 6:39 PM  [facility] [address]  [place], [place], [address] Phone: [phone]   Fax:  [phone]  Name: Miranda [name] MRN: [id number] Date of Birth: 1976/03/31

## 2021-08-11 ENCOUNTER — Other Ambulatory Visit: Payer: Self-pay

## 2021-08-11 ENCOUNTER — Ambulatory Visit: Payer: BC Managed Care – PPO | Admitting: Physical Therapy

## 2021-08-11 ENCOUNTER — Encounter: Payer: Self-pay | Admitting: Physical Therapy

## 2021-08-11 DIAGNOSIS — R600 Localized edema: Secondary | ICD-10-CM

## 2021-08-11 DIAGNOSIS — M25661 Stiffness of right knee, not elsewhere classified: Secondary | ICD-10-CM

## 2021-08-11 DIAGNOSIS — M25561 Pain in right knee: Secondary | ICD-10-CM | POA: Diagnosis not present

## 2021-08-11 DIAGNOSIS — R2689 Other abnormalities of gait and mobility: Secondary | ICD-10-CM

## 2021-08-11 DIAGNOSIS — M6281 Muscle weakness (generalized): Secondary | ICD-10-CM

## 2021-08-11 NOTE — Therapy (Signed)
[facility] [address]  [place], [place], [address] Phone: [phone]   Fax:  [phone]  Physical Therapy Treatment  Patient Details  Name: Miranda [name] MRN: [id number] Date of Birth: 04-21-1976 Referring Provider (PT): Marcene Duos   Encounter Date: 08/11/2021   PT End of Session - 08/11/21 1514     Visit Number 4    Number of Visits 16    Date for PT Re-Evaluation 09/27/21    Authorization Type BCBS    Progress Note Due on Visit 10    PT Start Time 1402    PT Stop Time 1507    PT Time Calculation (min) 65 min    Activity Tolerance Patient limited by pain    Behavior During Therapy [facility] for tasks assessed/performed             Past Medical History:  Diagnosis Date   Anginal pain ([place])    Anxiety    Arthritis    Asthma    Bowel obstruction ([place])    Cardiac arrhythmia    Depression    sees Dr. Matilde Haymaker in [place]    Diabetes mellitus    sees Dr. Madelin Rear    Dyspnea    Dysrhythmia    GERD (gastroesophageal reflux disease)    Hyperlipidemia    Hypertension    Hypothyroidism    sees Dr. Elyse Hsu   Insomnia    Migraine syndrome    sees Dr. 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 08/2017   Stroke ([place]) 03/25/2016   left MCA    Stroke [facility]) 04/2016   sees Dr. Teodora Medici at [facility]     Past Surgical History:  Procedure Laterality Date   blocked itestinal repair  1975   age 47 months   BREAST BIOPSY Left 2017   CHOLECYSTECTOMY  2011   INDUCED ABORTION  1996   forced abortion   INGUINAL HERNIA REPAIR Left 1980   age 45   LAPAROSCOPIC ENDOMETRIOSIS FULGURATION  1998   TOTAL KNEE ARTHROPLASTY Right 07/05/2021   Procedure: TOTAL KNEE  ARTHROPLASTY;  Surgeon: Meredith Pel, MD;  Location: [place];  Service: Orthopedics;  Laterality: Right;   WISDOM TOOTH EXTRACTION  2007    There were no vitals filed for this visit.   Subjective Assessment - 08/11/21 1407     Subjective Patient reports that she was able to really bend her knee yesterday, but today she woke up even tighter, so very frustrated.  She also reports her knee pops constantly when walking.  She reported today she did have an episode where she fell back into a chair and twisted her R knee, since then she reports she has had minimal progress in either pain or ROM.  At the time HHPT did not think she had hurt her knee, but now she is not so sure.  She has follow-up with orthopedist next week.    Pertinent History depression, PTSD,migraine/ TIA (loss of sensation on L side), L ischemic stroke, T2DM, irregular heartbeat, gastroparesis, IBS, asthma, GERD    Diagnostic tests X-ray    Patient Stated Goals normal function on knee, get it strainght and get  it bent    Currently in Pain? Yes    Pain Score 6     Pain Location Knee    Pain Orientation Right    Pain Descriptors / Indicators Aching;Sore    Pain Type Acute pain;Surgical pain                [facility] PT Assessment - 08/11/21 0001       Assessment   Medical Diagnosis Z98.890 (ICD-10-CM) - Post-operative state R TKA    Referring Provider (PT) Marcene Duos    Onset Date/Surgical Date 07/05/21    Hand Dominance Right    Next MD Visit 08/17/2021    Prior Therapy HHPT - 6  visits      Precautions   Precautions None      Restrictions   Weight Bearing Restrictions No      AROM   Right Knee Extension 10    Right Knee Flexion 75   75 beginning of session     PROM   Overall PROM Comments with overpressure at end of session, in supine, limited by pain.    Right Knee Extension 7    Right Knee Flexion 90                           [facility] Adult PT Treatment/Exercise - 08/11/21 0001        Exercises   Exercises Knee/Hip      Knee/Hip Exercises: Stretches   Quad Stretch Right;5 reps;30 seconds    Quad Stretch Limitations supine with feet on ball and strap      Knee/Hip Exercises: Aerobic   Nustep L4 x 8 min with overpressure to stretch R knee as tolerated      Knee/Hip Exercises: Supine   Heel Slides AAROM;Right;10 reps    Heel Slides Limitations with strap and feet on towel with PT assist    Straight Leg Raises Strengthening;Right;10 reps    Straight Leg Raises Limitations with estim to improve tolerance      Modalities   Modalities Vasopneumatic;Electrical Stimulation      Electrical Stimulation   Electrical Stimulation Location R quad    Electrical Stimulation Parameters IFC    Electrical Stimulation Goals Pain;Other (comment)   distraction during exercise     Vasopneumatic   Number Minutes Vasopneumatic  15 minutes    Vasopnuematic Location  Knee    Vasopneumatic Pressure Low    Vasopneumatic Temperature  34      Manual Therapy   Manual Therapy Joint mobilization    Manual therapy comments to improve R knee ROM    Joint Mobilization patellar mobs in supine, all directions, PA mobs tibia on femur                       PT Short Term Goals - 08/11/21 1518       PT SHORT TERM GOAL #1   Title Ind with initial HEP    Time 2    Period Weeks    Status Achieved    Target Date 08/16/21               PT Long Term Goals - 08/11/21 1519       PT LONG TERM GOAL #1   Title Ind with progressed HEP for LE strengthening/gait    Time 8    Period Weeks    Status On-going      PT LONG TERM GOAL #  2   Title Pt. will improve R knee extension to no more than lacking 5 deg full extension for safety with gait.    Baseline lacking 20 deg R knee extension.    Time 8    Period Weeks    Status On-going   10/13- lacking 7 degrees     PT LONG TERM GOAL #3   Title Pt. will demonstrate 115 deg R knee flexion to be able to perform activities  safely    Baseline 80 deg R knee flexion    Time 8    Period Weeks    Status On-going   10/13- 90 deg     PT LONG TERM GOAL #4   Title Pt. will be able to ambulate 1000' without AD safely without limitations from pain to access community.    Baseline 2WRW, slow antalgic gait    Time 8    Period Weeks    Status On-going   10/13- using SPC     PT LONG TERM GOAL #5   Title Patient will demonstrate improved functional LE strength by completing 5xSTS < 14 seconds.    Baseline 24 seconds.    Time 8    Period Weeks    Status On-going      PT LONG TERM GOAL #6   Title Pt. will report no more than 2/10 R knee pain with activity.    Baseline 5-8/10 depending on activity    Time 8    Period Weeks    Status On-going                   Plan - 08/11/21 1515     Clinical Impression Statement Patient continues to be extremely limited due to pain and swelling in her R knee.  She also reports that she did twist her knee a couple of weeks ago, encouraged her to discuss with surgeon since she feels since this episode she has not been able to make progress in either pain improvement or ROM.  Today applied e-stim to R quad during all exercises and stretches, this along with cues for breathing, did improve her tolerance to both exercise and manual therapy.  At end of session she measured 7-90 R knee ROM, which is a significant improvement.  Game ready applied end of session to decrease soreness and edema.  She would benefit from continued skilled therapy.    Personal Factors and Comorbidities Comorbidity 3+    Comorbidities depression, PTSD, obesity, TIA/migraine, T2DM, hypertension, HT, gastroparesis, IBS, asthma, GERD, L knee OA, history ischemic stroke, heart irregularities    Examination-Activity Limitations Stairs;Stand;Sit;Transfers;Locomotion Level;Carry;Bend;Squat;Toileting    Examination-Participation Restrictions Cleaning;Meal Prep;Community Activity;Laundry;Occupation     Stability/Clinical Decision Making Evolving/Moderate complexity    Rehab Potential Good    PT Frequency 2x / week    PT Duration 8 weeks    PT Treatment/Interventions ADLs/Self Care Home Management;Cryotherapy;Electrical Stimulation;Iontophoresis 4mg /ml Dexamethasone;Moist Heat;Ultrasound;Gait training;Stair training;Functional mobility training;Therapeutic activities;Therapeutic exercise;Balance training;Neuromuscular re-education;Patient/family education;Manual techniques;Scar mobilization;Passive range of motion;Dry needling;Taping;Vasopneumatic Device;Joint Manipulations    PT Next Visit Plan progress R knee ROM and stregth as tolerated.    PT Home Exercise Plan Access Code: [email or web address]    [email or web address] and Agree with Plan of Care Patient             Patient will benefit from skilled therapeutic intervention in order to improve the following deficits and impairments:  Abnormal gait, Decreased activity tolerance, Decreased endurance, Decreased range of motion, Decreased skin integrity, Decreased strength, Hypomobility,  Increased fascial restricitons, Impaired sensation, Pain, Decreased mobility, Decreased scar mobility, Difficulty walking, Increased edema, Increased muscle spasms, Impaired flexibility  Visit Diagnosis: Acute pain of right knee  Stiffness of right knee, not elsewhere classified  Other abnormalities of gait and mobility  Muscle weakness (generalized)  Localized edema     Problem List Patient Active Problem List   Diagnosis Date Noted   Arthritis of right knee    Diabetic gastroparesis ([place]) 07/20/2021   Constipation 07/20/2021   S/P total knee arthroplasty, right 07/05/2021   Chronic diarrhea 04/29/2021   Belching 04/29/2021   Primary osteoarthritis of both knees 10/28/2020   Irritable bowel syndrome with diarrhea 10/16/2019   Tendinitis of right rotator cuff 09/17/2019   Hand pain, right 05/21/2019   Labral tear of hip joint 05/21/2019   Type 2 diabetes  mellitus with diabetic neuropathy, unspecified ([place]) 10/01/2018   Right hip pain 05/22/2018   Tennis Must Quervain's disease (radial styloid tenosynovitis) 04/02/2018   Carpal tunnel syndrome, right upper limb 12/31/2017   Elbow injury, right, initial encounter 12/16/2017   Chronic pain of both shoulders 11/30/2017   PTSD (post-traumatic stress disorder) 10/25/2016   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) 03/29/2016   Plantar fasciitis 08/08/2013   Obesity, Class III, BMI 40-49.9 (morbid obesity) ([place]) 04/09/2013   Unspecified sleep apnea 03/31/2013   PCO (polycystic ovaries) 04/11/2012   Neck pain 11/21/2010   FATTY LIVER DISEASE 01/26/2010   NAUSEA WITH VOMITING 01/26/2010   PORTAL HYPERTENSION 01/26/2010   Migraine headache 06/28/2009   BREAST MASS 08/21/2008   KNEE PAIN 04/14/2008   Hypothyroidism 03/17/2008   Hyperlipidemia 03/17/2008   DEPRESSION 03/17/2008   NEUROPATHY 03/17/2008   Essential hypertension 03/17/2008   ASTHMA 03/17/2008   GERD 03/17/2008   Headache 03/17/2008    Rennie Natter, PT, DPT 08/11/2021, 3:21 PM  [facility] [address]  [place], [place], [address] Phone: [phone]   Fax:  [phone]  Name: MARITES NATH MRN: [id number] Date of Birth: 1976-09-21

## 2021-08-15 ENCOUNTER — Other Ambulatory Visit: Payer: Self-pay

## 2021-08-15 ENCOUNTER — Encounter: Payer: Self-pay | Admitting: Physical Therapy

## 2021-08-15 ENCOUNTER — Ambulatory Visit: Payer: BC Managed Care – PPO | Admitting: Physical Therapy

## 2021-08-15 DIAGNOSIS — R600 Localized edema: Secondary | ICD-10-CM

## 2021-08-15 DIAGNOSIS — M25561 Pain in right knee: Secondary | ICD-10-CM

## 2021-08-15 DIAGNOSIS — M25661 Stiffness of right knee, not elsewhere classified: Secondary | ICD-10-CM

## 2021-08-15 DIAGNOSIS — M6281 Muscle weakness (generalized): Secondary | ICD-10-CM

## 2021-08-15 DIAGNOSIS — R2689 Other abnormalities of gait and mobility: Secondary | ICD-10-CM

## 2021-08-15 NOTE — Therapy (Signed)
[facility] [address]  [place], [place], [address] Phone: [phone]   Fax:  [phone]  Physical Therapy Treatment  Patient Details  Name: Miranda [name] MRN: [id number] Date of Birth: 1976-05-21 Referring Provider (PT): Marcene Duos   Encounter Date: 08/15/2021   PT End of Session - 08/15/21 1032     Visit Number 5    Number of Visits 16    Date for PT Re-Evaluation 09/27/21    Authorization Type BCBS    Progress Note Due on Visit 10    PT Start Time 1027    PT Stop Time 1115    PT Time Calculation (min) 48 min    Activity Tolerance Patient limited by pain    Behavior During Therapy [facility] for tasks assessed/performed             Past Medical History:  Diagnosis Date   Anginal pain ([place])    Anxiety    Arthritis    Asthma    Bowel obstruction ([place])    Cardiac arrhythmia    Depression    sees Dr. Matilde Haymaker in [place]    Diabetes mellitus    sees Dr. Madelin Rear    Dyspnea    Dysrhythmia    GERD (gastroesophageal reflux disease)    Hyperlipidemia    Hypertension    Hypothyroidism    sees Dr. Elyse Hsu   Insomnia    Migraine syndrome    sees Dr. 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 08/2017   Stroke ([place]) 03/25/2016   left MCA    Stroke [facility]) 04/2016   sees Dr. Teodora Medici at [facility]     Past Surgical History:  Procedure Laterality Date   blocked itestinal repair  1975   age 32 months   BREAST BIOPSY Left 2017   CHOLECYSTECTOMY  2011   INDUCED ABORTION  1996   forced abortion   INGUINAL HERNIA REPAIR Left 1980   age 49   LAPAROSCOPIC ENDOMETRIOSIS FULGURATION  1998   TOTAL KNEE ARTHROPLASTY Right 07/05/2021   Procedure: TOTAL KNEE  ARTHROPLASTY;  Surgeon: Meredith Pel, MD;  Location: [place];  Service: Orthopedics;  Laterality: Right;   WISDOM TOOTH EXTRACTION  2007    There were no vitals filed for this visit.   Subjective Assessment - 08/15/21 1029     Subjective Patient reports that her ankle was swollen all weekend, but better today.  Her knee was very up and down this weekend, saturday was waking her every two hours, but last night able to sleep for 4 hours in bed.    Pertinent History depression, PTSD,migraine/ TIA (loss of sensation on L side), L ischemic stroke, T2DM, irregular heartbeat, gastroparesis, IBS, asthma, GERD    Diagnostic tests X-ray    Patient Stated Goals normal function on knee, get it strainght and get it bent    Currently in Pain? Yes    Pain Score 4     Pain Location Knee    Pain Orientation Right  [place] Adult PT Treatment/Exercise - 08/15/21 0001       Exercises   Exercises Knee/Hip      Knee/Hip Exercises: Aerobic   Nustep L4 x 5 min   started on bike x 2 min but very painful, still not able to complete full revolution.     Knee/Hip Exercises: Standing   Heel Raises Both;10 reps    Knee Flexion Strengthening;Right;2 sets;10 reps    Terminal Knee Extension Strengthening;Right;2 sets;10 reps    Terminal Knee Extension Limitations towel behind knee for tactile cues    Functional Squat 2 sets;10 reps    Functional Squat Limitations hands out counter with chair behind to improve knee flexion and safety    Other Standing Knee Exercises church pews 20x    Other Standing Knee Exercises retro steps with counter support 2x10      Knee/Hip Exercises: Seated   Long Arc Quad Strengthening;Right;1 set;10 reps      Knee/Hip Exercises: Supine   Straight Leg Raises Strengthening;Right;10 reps    Straight Leg Raises Limitations improving      Modalities   Modalities Vasopneumatic;Electrical Stimulation      Vasopneumatic   Number  Minutes Vasopneumatic  15 minutes    Vasopnuematic Location  Knee    Vasopneumatic Pressure Low    Vasopneumatic Temperature  34      Manual Therapy   Manual Therapy Joint mobilization    Manual therapy comments to improve R knee extension    Joint Mobilization PA mobs tibia on femur, STM to hamstrings.                       PT Short Term Goals - 08/11/21 1518       PT SHORT TERM GOAL #1   Title Ind with initial HEP    Time 2    Period Weeks    Status Achieved    Target Date 08/16/21               PT Long Term Goals - 08/11/21 1519       PT LONG TERM GOAL #1   Title Ind with progressed HEP for LE strengthening/gait    Time 8    Period Weeks    Status On-going      PT LONG TERM GOAL #2   Title Pt. will improve R knee extension to no more than lacking 5 deg full extension for safety with gait.    Baseline lacking 20 deg R knee extension.    Time 8    Period Weeks    Status On-going   10/13- lacking 7 degrees     PT LONG TERM GOAL #3   Title Pt. will demonstrate 115 deg R knee flexion to be able to perform activities safely    Baseline 80 deg R knee flexion    Time 8    Period Weeks    Status On-going   10/13- 90 deg     PT LONG TERM GOAL #4   Title Pt. will be able to ambulate 1000' without AD safely without limitations from pain to access community.    Baseline 2WRW, slow antalgic gait    Time 8    Period Weeks    Status On-going   10/13- using SPC     PT LONG TERM GOAL #5   Title Patient will demonstrate improved functional LE strength by completing 5xSTS < 14 seconds.    Baseline 24 seconds.  Time 8    Period Weeks    Status On-going      PT LONG TERM GOAL #6   Title Pt. will report no more than 2/10 R knee pain with activity.    Baseline 5-8/10 depending on activity    Time 8    Period Weeks    Status On-going                   Plan - 08/15/21 1032     Clinical Impression Statement Patient arrived 15 min late to  session, so focused session on reviewing and progressing standing exercises and knee extension.  At beginning of session her knee flexion measured 85 deg, but due to focus on extension only able to flex to 80 at end of session.  Her R quad strength is improving and able to complete LAQ lacking 12 deg today.  She still does not tolerate overpressure, at end of session still lacking 8 deg knee extension.  Game ready end of session to decrease post session edema and pain.  She would benefit from continued skilled therapy.    Personal Factors and Comorbidities Comorbidity 3+    Comorbidities depression, PTSD, obesity, TIA/migraine, T2DM, hypertension, HT, gastroparesis, IBS, asthma, GERD, L knee OA, history ischemic stroke, heart irregularities    Examination-Activity Limitations Stairs;Stand;Sit;Transfers;Locomotion Level;Carry;Bend;Squat;Toileting    Examination-Participation Restrictions Cleaning;Meal Prep;Community Activity;Laundry;Occupation    Stability/Clinical Decision Making Evolving/Moderate complexity    Rehab Potential Good    PT Frequency 2x / week    PT Duration 8 weeks    PT Treatment/Interventions ADLs/Self Care Home Management;Cryotherapy;Electrical Stimulation;Iontophoresis 4mg /ml Dexamethasone;Moist Heat;Ultrasound;Gait training;Stair training;Functional mobility training;Therapeutic activities;Therapeutic exercise;Balance training;Neuromuscular re-education;Patient/family education;Manual techniques;Scar mobilization;Passive range of motion;Dry needling;Taping;Vasopneumatic Device;Joint Manipulations    PT Next Visit Plan progress R knee ROM and stregth as tolerated.    PT Home Exercise Plan Access Code: [email or web address]    [email or web address] and Agree with Plan of Care Patient             Patient will benefit from skilled therapeutic intervention in order to improve the following deficits and impairments:  Abnormal gait, Decreased activity tolerance, Decreased endurance, Decreased range of  motion, Decreased skin integrity, Decreased strength, Hypomobility, Increased fascial restricitons, Impaired sensation, Pain, Decreased mobility, Decreased scar mobility, Difficulty walking, Increased edema, Increased muscle spasms, Impaired flexibility  Visit Diagnosis: Acute pain of right knee  Stiffness of right knee, not elsewhere classified  Other abnormalities of gait and mobility  Muscle weakness (generalized)  Localized edema     Problem List Patient Active Problem List   Diagnosis Date Noted   Arthritis of right knee    Diabetic gastroparesis ([place]) 07/20/2021   Constipation 07/20/2021   S/P total knee arthroplasty, right 07/05/2021   Chronic diarrhea 04/29/2021   Belching 04/29/2021   Primary osteoarthritis of both knees 10/28/2020   Irritable bowel syndrome with diarrhea 10/16/2019   Tendinitis of right rotator cuff 09/17/2019   Hand pain, right 05/21/2019   Labral tear of hip joint 05/21/2019   Type 2 diabetes mellitus with diabetic neuropathy, unspecified ([place]) 10/01/2018   Right hip pain 05/22/2018   Tennis Must Quervain's disease (radial styloid tenosynovitis) 04/02/2018   Carpal tunnel syndrome, right upper limb 12/31/2017   Elbow injury, right, initial encounter 12/16/2017   Chronic pain of both shoulders 11/30/2017   PTSD (post-traumatic stress disorder) 10/25/2016   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[place]) 10/25/2016   Diabetes mellitus ([place]) 10/25/2016   Major depressive disorder, recurrent episode, severe, with psychosis ([place]) 10/20/2016  Major depressive disorder, recurrent episode, severe, 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[place]) 10/19/2016   TIA (transient ischemic attack) 05/16/2016   Arterial ischemic stroke, MCA, left, acute ([place]) 03/29/2016   Plantar fasciitis 08/08/2013   Obesity, Class III, BMI 40-49.9 (morbid obesity) ([place]) 04/09/2013   Unspecified sleep apnea 03/31/2013   PCO (polycystic ovaries) 04/11/2012   Neck pain 11/21/2010   FATTY LIVER DISEASE  01/26/2010   NAUSEA WITH VOMITING 01/26/2010   PORTAL HYPERTENSION 01/26/2010   Migraine headache 06/28/2009   BREAST MASS 08/21/2008   KNEE PAIN 04/14/2008   Hypothyroidism 03/17/2008   Hyperlipidemia 03/17/2008   DEPRESSION 03/17/2008   NEUROPATHY 03/17/2008   Essential hypertension 03/17/2008   ASTHMA 03/17/2008   GERD 03/17/2008   Headache 03/17/2008    Rennie Natter, PT, DPT 08/15/2021, 12:08 PM  [facility] [address]  [place], [place], [address] Phone: [phone]   Fax:  [phone]  Name: Miranda [name] MRN: [id number] Date of Birth: [birth year]

## 2021-08-15 NOTE — Patient Instructions (Signed)
Access Code: [email or web address] URL: [email or web address] Date: 08/15/2021 Prepared by: Glenetta Hew  Exercises Standing Terminal Knee Extension at [facility] with Diona Foley - 1 x daily - 7 x weekly - 2 sets - 10 reps - 5 sec hold Standing Hamstring Curl with Chair Support - 1 x daily - 7 x weekly - 2 sets - 10 reps Seated Passive Knee Extension with Weight - 1 x daily - 7 x weekly - 1 sets - 1 reps - 3-5 min hold

## 2021-08-17 ENCOUNTER — Other Ambulatory Visit: Payer: Self-pay

## 2021-08-17 ENCOUNTER — Ambulatory Visit (INDEPENDENT_AMBULATORY_CARE_PROVIDER_SITE_OTHER): Payer: BC Managed Care – PPO | Admitting: Orthopedic Surgery

## 2021-08-17 ENCOUNTER — Ambulatory Visit: Payer: BC Managed Care – PPO

## 2021-08-17 DIAGNOSIS — M6281 Muscle weakness (generalized): Secondary | ICD-10-CM

## 2021-08-17 DIAGNOSIS — M25561 Pain in right knee: Secondary | ICD-10-CM | POA: Diagnosis not present

## 2021-08-17 DIAGNOSIS — R600 Localized edema: Secondary | ICD-10-CM

## 2021-08-17 DIAGNOSIS — M25661 Stiffness of right knee, not elsewhere classified: Secondary | ICD-10-CM

## 2021-08-17 DIAGNOSIS — M1711 Unilateral primary osteoarthritis, right knee: Secondary | ICD-10-CM

## 2021-08-17 DIAGNOSIS — R2689 Other abnormalities of gait and mobility: Secondary | ICD-10-CM

## 2021-08-17 NOTE — Therapy (Signed)
[facility] [address]  [place], [place], [address] Phone: [phone]   Fax:  [phone]  Physical Therapy Treatment  Patient Details  Name: Miranda [name] MRN: [id number] Date of Birth: 01/30/1976 Referring Provider (PT): Marcene Duos   Encounter Date: 08/17/2021   PT End of Session - 08/17/21 1511     Visit Number 6    Number of Visits 16    Date for PT Re-Evaluation 09/27/21    Authorization Type BCBS    Authorization - Visit Number --    Authorization - Number of Visits 60    Progress Note Due on Visit 10    PT Start Time 1406    PT Stop Time 1502    PT Time Calculation (min) 56 min    Activity Tolerance Patient limited by pain;Patient tolerated treatment well    Behavior During Therapy [facility] for tasks assessed/performed             Past Medical History:  Diagnosis Date   Anginal pain ([place])    Anxiety    Arthritis    Asthma    Bowel obstruction ([place])    Cardiac arrhythmia    Depression    sees Dr. Matilde Haymaker in [place]    Diabetes mellitus    sees Dr. Madelin Rear    Dyspnea    Dysrhythmia    GERD (gastroesophageal reflux disease)    Hyperlipidemia    Hypertension    Hypothyroidism    sees Dr. Elyse Hsu   Insomnia    Migraine syndrome    sees Dr. 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 08/2017   Stroke ([place]) 03/25/2016   left MCA    Stroke [facility]) 04/2016   se[facility] Dr. Teodora Medici at [facility]     Past Surgical History:  Procedure Laterality Date   blocked itestinal repair  1975   age 55 months   BREAST BIOPSY Left 2017   CHOLECYSTECTOMY  2011   INDUCED ABORTION  1996   forced abortion   INGUINAL HERNIA REPAIR Left 1980   age 66    LAPAROSCOPIC ENDOMETRIOSIS FULGURATION  1998   TOTAL KNEE ARTHROPLASTY Right 07/05/2021   Procedure: TOTAL KNEE ARTHROPLASTY;  Surgeon: Meredith Pel, MD;  Location: [place];  Service: Orthopedics;  Laterality: Right;   WISDOM TOOTH EXTRACTION  2007    There were no vitals filed for this visit.   Subjective Assessment - 08/17/21 1413     Subjective Pt notes that she is going through with MUA within the next 10 days but does not know the exact day. R ankle pain has resolved as of today.    Pertinent History depression, PTSD,migraine/ TIA (loss of sensation on L side), L ischemic stroke, T2DM, irregular heartbeat, gastroparesis, IBS, asthma, GERD    Diagnostic tests X-ray    Patient Stated Goals normal function on knee, get it strainght and get it bent    Currently in Pain? Yes    Pain Score 5     Pain Location Knee    Pain Orientation Right    Pain Descriptors / Indicators Aching    Pain Type Acute pain;Surgical pain  [facility] PT Assessment - 08/17/21 0001       AROM   Right Knee Extension 9    Right Knee Flexion 91                           [facility] Adult PT Treatment/Exercise - 08/17/21 0001       Exercises   Exercises Knee/Hip      Knee/Hip Exercises: Stretches   Passive Hamstring Stretch Right;3 reps;20 seconds    Passive Hamstring Stretch Limitations manual stretch with ankle DF in supine    Gastroc Stretch Right;3 reps;30 seconds    Gastroc Stretch Limitations runnner stretch      Knee/Hip Exercises: Aerobic   Nustep L4 x 6 min      Knee/Hip Exercises: Standing   Hip Flexion Stengthening;Both;10 reps;Knee bent    Hip Flexion Limitations red TB    Terminal Knee Extension Strengthening;Right;10 reps    Theraband Level (Terminal Knee Extension) Level 2 (Red)    Hip Abduction Stengthening;Both;10 reps    Abduction Limitations red TB with counter support    Hip Extension Stengthening;Both;10 reps    Extension Limitations red TB  with counter support      Knee/Hip Exercises: Supine   Straight Leg Raises Strengthening;Right;10 reps      Modalities   Modalities Vasopneumatic      Vasopneumatic   Number Minutes Vasopneumatic  10 minutes    Vasopnuematic Location  Knee    Vasopneumatic Pressure Low    Vasopneumatic Temperature  34      Manual Therapy   Manual Therapy Joint mobilization    Joint Mobilization PA mobs tibia on femur grade III                       PT Short Term Goals - 08/11/21 1518       PT SHORT TERM GOAL #1   Title Ind with initial HEP    Time 2    Period Weeks    Status Achieved    Target Date 08/16/21               PT Long Term Goals - 08/11/21 1519       PT LONG TERM GOAL #1   Title Ind with progressed HEP for LE strengthening/gait    Time 8    Period Weeks    Status On-going      PT LONG TERM GOAL #2   Title Pt. will improve R knee extension to no more than lacking 5 deg full extension for safety with gait.    Baseline lacking 20 deg R knee extension.    Time 8    Period Weeks    Status On-going   10/13- lacking 7 degrees     PT LONG TERM GOAL #3   Title Pt. will demonstrate 115 deg R knee flexion to be able to perform activities safely    Baseline 80 deg R knee flexion    Time 8    Period Weeks    Status On-going   10/13- 90 deg     PT LONG TERM GOAL #4   Title Pt. will be able to ambulate 1000' without AD safely without limitations from pain to access community.    Baseline 2WRW, slow antalgic gait    Time 8    Period Weeks    Status On-going   10/13- using SPC     PT LONG TERM  GOAL #5   Title Patient will demonstrate improved functional LE strength by completing 5xSTS < 14 seconds.    Baseline 24 seconds.    Time 8    Period Weeks    Status On-going      PT LONG TERM GOAL #6   Title Pt. will report no more than 2/10 R knee pain with activity.    Baseline 5-8/10 depending on activity    Time 8    Period Weeks    Status On-going                    Plan - 08/17/21 1512     Clinical Impression Statement Pt arrived to session noting that she plans to have a MUA within 10 days. Supervising PT was made aware and consulted with patient on the plan going foward. Worked on hip/knee strengthening today in standing positions, less weight acceptance noted on the R side. She had much improvement from the joint mobs done today, ROM now 9-91 deg post session today. Ended session with GR to address residual soreness from exercises today. She would continue to benefit from PT even after she undergoes MUA to improve R knee ROM and strength to improve function.    Personal Factors and Comorbidities Comorbidity 3+    Comorbidities depression, PTSD, obesity, TIA/migraine, T2DM, hypertension, HT, gastroparesis, IBS, asthma, GERD, L knee OA, history ischemic stroke, heart irregularities    PT Frequency 2x / week    PT Duration 8 weeks    PT Treatment/Interventions ADLs/Self Care Home Management;Cryotherapy;Electrical Stimulation;Iontophoresis 4mg /ml Dexamethasone;Moist Heat;Ultrasound;Gait training;Stair training;Functional mobility training;Therapeutic activities;Therapeutic exercise;Balance training;Neuromuscular re-education;Patient/family education;Manual techniques;Scar mobilization;Passive range of motion;Dry needling;Taping;Vasopneumatic Device;Joint Manipulations    PT Next Visit Plan progress R knee ROM and stregth as tolerated.    PT Home Exercise Plan Access Code: [email or web address]    [email or web address] and Agree with Plan of Care Patient             Patient will benefit from skilled therapeutic intervention in order to improve the following deficits and impairments:  Abnormal gait, Decreased activity tolerance, Decreased endurance, Decreased range of motion, Decreased skin integrity, Decreased strength, Hypomobility, Increased fascial restricitons, Impaired sensation, Pain, Decreased mobility, Decreased scar mobility, Difficulty walking,  Increased edema, Increased muscle spasms, Impaired flexibility  Visit Diagnosis: Acute pain of right knee  Stiffness of right knee, not elsewhere classified  Other abnormalities of gait and mobility  Muscle weakness (generalized)  Localized edema     Problem List Patient Active Problem List   Diagnosis Date Noted   Arthritis of right knee    Diabetic gastroparesis ([place]) 07/20/2021   Constipation 07/20/2021   S/P total knee arthroplasty, right 07/05/2021   Chronic diarrhea 04/29/2021   Belching 04/29/2021   Primary osteoarthritis of both knees 10/28/2020   Irritable bowel syndrome with diarrhea 10/16/2019   Tendinitis of right rotator cuff 09/17/2019   Hand pain, right 05/21/2019   Labral tear of hip joint 05/21/2019   Type 2 diabetes mellitus with diabetic neuropathy, unspecified ([place]) 10/01/2018   Right hip pain 05/22/2018   Tennis Must Quervain's disease (radial styloid tenosynovitis) 04/02/2018   Carpal tunnel syndrome, right upper limb 12/31/2017   Elbow injury, right, initial encounter 12/16/2017   Chronic pain of both shoulders 11/30/2017   PTSD (post-traumatic stress disorder) 10/25/2016   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[place]) 10/25/2016   Diabetes mellitus ([place]) 10/25/2016   Major depressive disorder, recurrent episode, severe, with psychosis ([place]) 10/20/2016   Major depressive disorder, recurrent episode,  severe, 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[place]) 10/19/2016   TIA (transient ischemic attack) 05/16/2016   Arterial ischemic stroke, MCA, left, acute ([place]) 03/29/2016   Plantar fasciitis 08/08/2013   Obesity, Class III, BMI 40-49.9 (morbid obesity) ([place]) 04/09/2013   Unspecified sleep apnea 03/31/2013   PCO (polycystic ovaries) 04/11/2012   Neck pain 11/21/2010   FATTY LIVER DISEASE 01/26/2010   NAUSEA WITH VOMITING 01/26/2010   PORTAL HYPERTENSION 01/26/2010   Migraine headache 06/28/2009   BREAST MASS 08/21/2008   KNEE PAIN 04/14/2008   Hypothyroidism 03/17/2008    Hyperlipidemia 03/17/2008   DEPRESSION 03/17/2008   NEUROPATHY 03/17/2008   Essential hypertension 03/17/2008   ASTHMA 03/17/2008   GERD 03/17/2008   Headache 03/17/2008    Artist Pais, PTA 08/17/2021, 3:27 PM  [facility] [address]  [place], [place], [address] Phone: [phone]   Fax:  [phone]  Name: Miranda [name] MRN: [id number] Date of Birth: 04-02-1976

## 2021-08-18 ENCOUNTER — Encounter (HOSPITAL_BASED_OUTPATIENT_CLINIC_OR_DEPARTMENT_OTHER): Payer: Self-pay | Admitting: Orthopedic Surgery

## 2021-08-18 ENCOUNTER — Other Ambulatory Visit: Payer: Self-pay

## 2021-08-18 NOTE — Progress Notes (Signed)
Chartreviewed by Dr. Rex Kras and will proceed with surgery as scheduled at [facility].

## 2021-08-19 ENCOUNTER — Ambulatory Visit: Payer: BC Managed Care – PPO

## 2021-08-19 ENCOUNTER — Encounter (HOSPITAL_BASED_OUTPATIENT_CLINIC_OR_DEPARTMENT_OTHER)
Admission: RE | Admit: 2021-08-19 | Discharge: 2021-08-19 | Disposition: A | Discharge status: Home / Self Care | Payer: BC Managed Care – PPO | Source: Ambulatory Visit | Attending: Orthopedic Surgery | Admitting: Orthopedic Surgery

## 2021-08-19 DIAGNOSIS — Z6838 Body mass index (BMI) 38.0-38.9, adult: Secondary | ICD-10-CM | POA: Diagnosis not present

## 2021-08-19 DIAGNOSIS — R2689 Other abnormalities of gait and mobility: Secondary | ICD-10-CM

## 2021-08-19 DIAGNOSIS — M6281 Muscle weakness (generalized): Secondary | ICD-10-CM

## 2021-08-19 DIAGNOSIS — E039 Hypothyroidism, unspecified: Secondary | ICD-10-CM | POA: Diagnosis not present

## 2021-08-19 DIAGNOSIS — J45909 Unspecified asthma, uncomplicated: Secondary | ICD-10-CM | POA: Diagnosis not present

## 2021-08-19 DIAGNOSIS — R600 Localized edema: Secondary | ICD-10-CM

## 2021-08-19 DIAGNOSIS — Z791 Long term (current) use of non-steroidal anti-inflammatories (NSAID): Secondary | ICD-10-CM | POA: Diagnosis not present

## 2021-08-19 DIAGNOSIS — F17210 Nicotine dependence, cigarettes, uncomplicated: Secondary | ICD-10-CM | POA: Diagnosis not present

## 2021-08-19 DIAGNOSIS — Z7951 Long term (current) use of inhaled steroids: Secondary | ICD-10-CM | POA: Diagnosis not present

## 2021-08-19 DIAGNOSIS — E1140 Type 2 diabetes mellitus with diabetic neuropathy, unspecified: Secondary | ICD-10-CM | POA: Diagnosis not present

## 2021-08-19 DIAGNOSIS — E785 Hyperlipidemia, unspecified: Secondary | ICD-10-CM | POA: Diagnosis not present

## 2021-08-19 DIAGNOSIS — Z794 Long term (current) use of insulin: Secondary | ICD-10-CM | POA: Diagnosis not present

## 2021-08-19 DIAGNOSIS — M25561 Pain in right knee: Secondary | ICD-10-CM | POA: Diagnosis not present

## 2021-08-19 DIAGNOSIS — Z96651 Presence of right artificial knee joint: Secondary | ICD-10-CM | POA: Diagnosis not present

## 2021-08-19 DIAGNOSIS — Z886 Allergy status to analgesic agent status: Secondary | ICD-10-CM | POA: Diagnosis not present

## 2021-08-19 DIAGNOSIS — Z880 Allergy status to penicillin: Secondary | ICD-10-CM | POA: Diagnosis not present

## 2021-08-19 DIAGNOSIS — Z79899 Other long term (current) drug therapy: Secondary | ICD-10-CM | POA: Diagnosis not present

## 2021-08-19 DIAGNOSIS — Z7902 Long term (current) use of antithrombotics/antiplatelets: Secondary | ICD-10-CM | POA: Diagnosis not present

## 2021-08-19 DIAGNOSIS — F1290 Cannabis use, unspecified, uncomplicated: Secondary | ICD-10-CM | POA: Diagnosis not present

## 2021-08-19 DIAGNOSIS — M25661 Stiffness of right knee, not elsewhere classified: Secondary | ICD-10-CM

## 2021-08-19 DIAGNOSIS — Z881 Allergy status to other antibiotic agents status: Secondary | ICD-10-CM | POA: Diagnosis not present

## 2021-08-19 DIAGNOSIS — K219 Gastro-esophageal reflux disease without esophagitis: Secondary | ICD-10-CM | POA: Diagnosis not present

## 2021-08-19 DIAGNOSIS — Z7901 Long term (current) use of anticoagulants: Secondary | ICD-10-CM | POA: Diagnosis not present

## 2021-08-19 DIAGNOSIS — I10 Essential (primary) hypertension: Secondary | ICD-10-CM | POA: Diagnosis not present

## 2021-08-19 DIAGNOSIS — G4730 Sleep apnea, unspecified: Secondary | ICD-10-CM | POA: Diagnosis not present

## 2021-08-19 LAB — BASIC METABOLIC PANEL
Anion gap: 6 (ref 5–15)
BUN: 7 mg/dL (ref 6–20)
CO2: 24 mmol/L (ref 22–32)
Calcium: 9.4 mg/dL (ref 8.9–10.3)
Chloride: 108 mmol/L (ref 98–111)
Creatinine, Ser: 0.73 mg/dL (ref 0.44–1.00)
GFR, Estimated: >60 mL/min (ref >60)
Glucose, Bld: 151 mg/dL — ABNORMAL HIGH (ref 70–99)
Potassium: 4.2 mmol/L (ref 3.5–5.1)
Sodium: 138 mmol/L (ref 135–145)

## 2021-08-19 NOTE — Therapy (Signed)
[facility] [address]  [place], [place], [address] Phone: [phone]   Fax:  [phone]  Physical Therapy Treatment  Patient Details  Name: Miranda [name] MRN: [id number] Date of Birth: [birth year] Referring Provider (PT): Marcene Duos   Encounter Date: 08/19/2021   PT End of Session - 08/19/21 0937     Visit Number 7    Number of Visits 16    Date for PT Re-Evaluation 09/27/21    Authorization Type BCBS    Authorization - Number of Visits 60    Progress Note Due on Visit 10    PT Start Time 0847    PT Stop Time 0931    PT Time Calculation (min) 44 min    Activity Tolerance Patient tolerated treatment well;Patient limited by pain    Behavior During Therapy [facility] for tasks assessed/performed             Past Medical History:  Diagnosis Date   Anginal pain ([place])    Anxiety    Arthritis    Asthma    Bowel obstruction ([place])    Cardiac arrhythmia    Depression    sees Dr. Matilde Haymaker in [place]    Diabetes mellitus    sees Dr. Madelin Rear    Dyspnea    Dysrhythmia    GERD (gastroesophageal reflux disease)    Hyperlipidemia    Hypertension    Hypothyroidism    sees Dr. Elyse Hsu   Insomnia    Migraine syndrome    sees Dr. 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 08/2017   Stroke ([place]) 03/25/2016   left MCA    Stroke [facility]) 04/2016   sees Dr. Teodora Medici at [facility]     Past Surgical History:  Procedure Laterality Date   blocked itestinal repair  1975   age 38 months   BREAST BIOPSY Left 2017   CHOLECYSTECTOMY  2011   INDUCED ABORTION  1996   forced abortion   INGUINAL HERNIA REPAIR Left 1980   age 70   LAPAROSCOPIC ENDOMETRIOSIS FULGURATION  1998    TOTAL KNEE ARTHROPLASTY Right 07/05/2021   Procedure: TOTAL KNEE ARTHROPLASTY;  Surgeon: Meredith Pel, MD;  Location: [place];  Service: Orthopedics;  Laterality: Right;   WISDOM TOOTH EXTRACTION  2007    There were no vitals filed for this visit.   Subjective Assessment - 08/19/21 0852     Subjective Pt notes increased pain lately due to not being able to take ibuprofen for MUA on Monday.    Pertinent History depression, PTSD,migraine/ TIA (loss of sensation on L side), L ischemic stroke, T2DM, irregular heartbeat, gastroparesis, IBS, asthma, GERD    Diagnostic tests X-ray    Patient Stated Goals normal function on knee, get it strainght and get it bent    Currently in Pain? Yes    Pain Score 7     Pain Location Knee    Pain Orientation Right    Pain Descriptors / Indicators Aching;Constant    Pain Type Acute pain;Surgical pain  [facility] Adult PT Treatment/Exercise - 08/19/21 0001       Exercises   Exercises Knee/Hip      Knee/Hip Exercises: Aerobic   Nustep L4x92min      Knee/Hip Exercises: Machines for Strengthening   Cybex Leg Press 15# 10 reps      Knee/Hip Exercises: Standing   Hip Abduction Stengthening;Both;10 reps    Abduction Limitations red TB with counter support    Hip Extension Stengthening;Both;10 reps    Extension Limitations red TB with counter support    Lateral Step Up Both;10 reps;Hand Hold: 2;Step Height: 4"    Forward Step Up Both;2 sets;10 reps;Hand Hold: 2;Step Height: 4"      Knee/Hip Exercises: Seated   Long Arc Quad Strengthening;Both;2 sets;10 reps    Long Arc Quad Limitations with alt ankle PF/DF    Hamstring Curl Strengthening;Right;10 reps    Hamstring Limitations red TB    Sit to Sand 10 reps;without UE support                       PT Short Term Goals - 08/11/21 1518       PT SHORT TERM GOAL #1   Title Ind with initial HEP    Time 2    Period Weeks    Status  Achieved    Target Date 08/16/21               PT Long Term Goals - 08/11/21 1519       PT LONG TERM GOAL #1   Title Ind with progressed HEP for LE strengthening/gait    Time 8    Period Weeks    Status On-going      PT LONG TERM GOAL #2   Title Pt. will improve R knee extension to no more than lacking 5 deg full extension for safety with gait.    Baseline lacking 20 deg R knee extension.    Time 8    Period Weeks    Status On-going   10/13- lacking 7 degrees     PT LONG TERM GOAL #3   Title Pt. will demonstrate 115 deg R knee flexion to be able to perform activities safely    Baseline 80 deg R knee flexion    Time 8    Period Weeks    Status On-going   10/13- 90 deg     PT LONG TERM GOAL #4   Title Pt. will be able to ambulate 1000' without AD safely without limitations from pain to access community.    Baseline 2WRW, slow antalgic gait    Time 8    Period Weeks    Status On-going   10/13- using SPC     PT LONG TERM GOAL #5   Title Patient will demonstrate improved functional LE strength by completing 5xSTS < 14 seconds.    Baseline 24 seconds.    Time 8    Period Weeks    Status On-going      PT LONG TERM GOAL #6   Title Pt. will report no more than 2/10 R knee pain with activity.    Baseline 5-8/10 depending on activity    Time 8    Period Weeks    Status On-going                   Plan - 08/19/21 6759     Clinical Impression Statement Pt is scheduled to have a MUA on Monday. Worked  mostly on strengthening today for the LEs. Incorporated step ups and leg press for functional strengthening. Cues required for isolating the correct movement with the exercises today, she still can work on exercises to facilitate R quads to contract and prevent compensation with her hips.    Personal Factors and Comorbidities Comorbidity 3+    Comorbidities depression, PTSD, obesity, TIA/migraine, T2DM, hypertension, HT, gastroparesis, IBS, asthma, GERD, L knee OA,  history ischemic stroke, heart irregularities    PT Frequency 2x / week    PT Duration 8 weeks    PT Treatment/Interventions ADLs/Self Care Home Management;Cryotherapy;Electrical Stimulation;Iontophoresis 4mg /ml Dexamethasone;Moist Heat;Ultrasound;Gait training;Stair training;Functional mobility training;Therapeutic activities;Therapeutic exercise;Balance training;Neuromuscular re-education;Patient/family education;Manual techniques;Scar mobilization;Passive range of motion;Dry needling;Taping;Vasopneumatic Device;Joint Manipulations    PT Next Visit Plan progress R knee ROM and stregth as tolerated.    PT Home Exercise Plan Access Code: [email or web address]    [email or web address] and Agree with Plan of Care Patient             Patient will benefit from skilled therapeutic intervention in order to improve the following deficits and impairments:  Abnormal gait, Decreased activity tolerance, Decreased endurance, Decreased range of motion, Decreased skin integrity, Decreased strength, Hypomobility, Increased fascial restricitons, Impaired sensation, Pain, Decreased mobility, Decreased scar mobility, Difficulty walking, Increased edema, Increased muscle spasms, Impaired flexibility  Visit Diagnosis: Acute pain of right knee  Stiffness of right knee, not elsewhere classified  Other abnormalities of gait and mobility  Muscle weakness (generalized)  Localized edema     Problem List Patient Active Problem List   Diagnosis Date Noted   Arthritis of right knee    Diabetic gastroparesis ([place]) 07/20/2021   Constipation 07/20/2021   S/P total knee arthroplasty, right 07/05/2021   Chronic diarrhea 04/29/2021   Belching 04/29/2021   Primary osteoarthritis of both knees 10/28/2020   Irritable bowel syndrome with diarrhea 10/16/2019   Tendinitis of right rotator cuff 09/17/2019   Hand pain, right 05/21/2019   Labral tear of hip joint 05/21/2019   Type 2 diabetes mellitus with diabetic neuropathy, unspecified  ([place]) 10/01/2018   Right hip pain 05/22/2018   Tennis Must Quervain's disease (radial styloid tenosynovitis) 04/02/2018   Carpal tunnel syndrome, right upper limb 12/31/2017   Elbow injury, right, initial encounter 12/16/2017   Chronic pain of both shoulders 11/30/2017   PTSD (post-traumatic stress disorder) 10/25/2016   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[place]) 10/25/2016   Diabetes mellitus ([place]) 10/25/2016   Major depressive disorder, recurrent episode, severe, with psychosis ([place]) 10/20/2016   Major depressive disorder, recurrent episode, severe, 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[place]) 10/19/2016   TIA (transient ischemic attack) 05/16/2016   Arterial ischemic stroke, MCA, left, acute ([place]) 03/29/2016   Plantar fasciitis 08/08/2013   Obesity, Class III, BMI 40-49.9 (morbid obesity) ([place]) 04/09/2013   Unspecified sleep apnea 03/31/2013   PCO (polycystic ovaries) 04/11/2012   Neck pain 11/21/2010   FATTY LIVER DISEASE 01/26/2010   NAUSEA WITH VOMITING 01/26/2010   PORTAL HYPERTENSION 01/26/2010   Migraine headache 06/28/2009   BREAST MASS 08/21/2008   KNEE PAIN 04/14/2008   Hypothyroidism 03/17/2008   Hyperlipidemia 03/17/2008   DEPRESSION 03/17/2008   NEUROPATHY 03/17/2008   Essential hypertension 03/17/2008   ASTHMA 03/17/2008   GERD 03/17/2008   Headache 03/17/2008    Artist Pais, PTA 08/19/2021, 10:11 AM  [facility] [address]  [place], [place], [address] Phone: [phone]   Fax:  [phone]  Name: LOZA PRELL MRN: [id number] Date of  Birth: November 14, 1975

## 2021-08-21 ENCOUNTER — Encounter: Payer: Self-pay | Admitting: Orthopedic Surgery

## 2021-08-21 NOTE — Progress Notes (Signed)
Post-Op Visit Note   Patient: Miranda [name]           Date of Birth: August 23, 1976           MRN: [id number] Visit Date: 08/17/2021 PCP: Vonita Moss, NP   Assessment & Plan:  Chief Complaint:  Chief Complaint  Patient presents with   Right Knee - Routine Post Op    07/05/21 (6w 1d) Total Knee Arthroplasty - Right     Visit Diagnoses:  1. Unilateral primary osteoarthritis, right knee     Plan: [name] is a 45 year old patient with right knee pain.  Had total knee replacement 07/05/2021.  Has good and bad days.  Therapy notes are reviewed.  She has been doing home exercise program.  She is using a cane.  On exam she has range of motion of 8-80.  Taking Duexis oxycodone and tizanidine.  No significant effusion in the knee.  Radiographs look good.  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  I think at this time based on her general regression in therapy that manipulation is indicated with CPM to follow 6 hours a day minimum for the first week postop.  If we can get her fairly easily past 90 degrees of flexion I think it would be beneficial.  Follow-up 7 days after surgery.  The risk and benefits of surgery are discussed including not limited to infection nerve vessel damage incomplete restoration of motion as well as potential for fracture which is unlikely.  We would plan to inject Marcaine morphine clonidine as well as TXA in the joint after manipulation and to enhance chances for maintenance of range of motion gains.  Follow-Up Instructions: No follow-ups on file.   Orders:  No orders of the defined types were placed in this encounter.  No orders of the defined types were placed in this encounter.   Imaging: No results found.  PMFS History: Patient Active Problem List   Diagnosis Date Noted   Arthritis of right knee    Diabetic gastroparesis ([place]) 07/20/2021   Constipation 07/20/2021   S/P total knee arthroplasty, right 07/05/2021    Chronic diarrhea 04/29/2021   Belching 04/29/2021   Primary osteoarthritis of both knees 10/28/2020   Irritable bowel syndrome with diarrhea 10/16/2019   Tendinitis of right rotator cuff 09/17/2019   Hand pain, right 05/21/2019   Labral tear of hip joint 05/21/2019   Type 2 diabetes mellitus with diabetic neuropathy, unspecified ([name]) 10/01/2018   Right hip pain 05/22/2018   [place] Must Quervain's disease (radial styloid tenosynovitis) 04/02/2018   Carpal tunnel syndrome, right upper limb 12/31/2017   Elbow injury, right, initial encounter 12/16/2017   Chronic pain of both shoulders 11/30/2017   PTSD (post-traumatic stress disorder) 10/25/2016   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) 04/09/2013   Unspecified sleep apnea 03/31/2013   PCO (polycystic ovaries) 04/11/2012   Neck pain 11/21/2010   FATTY LIVER DISEASE 01/26/2010   NAUSEA WITH VOMITING 01/26/2010   PORTAL HYPERTENSION 01/26/2010   Migraine headache 06/28/2009   BREAST MASS 08/21/2008   KNEE PAIN 04/14/2008   Hypothyroidism 03/17/2008   Hyperlipidemia  03/17/2008   DEPRESSION 03/17/2008   NEUROPATHY 03/17/2008   Essential hypertension 03/17/2008   ASTHMA 03/17/2008   GERD 03/17/2008   Headache 03/17/2008   Past Medical History:  Diagnosis Date   Anginal pain (HCC)    Anxiety    Arthritis    Asthma    Bowel obstruction (HCC)    Cardiac arrhythmia    Depression    sees Dr. Matilde Haymaker in [place]    Diabetes mellitus    sees Dr. Madelin Rear    Dyspnea    Dysrhythmia    GERD  (gastroesophageal reflux disease)    Hyperlipidemia    Hypertension    Hypothyroidism    sees Dr. Elyse Hsu   Insomnia    Migraine syndrome    sees 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 08/2017   Stroke ([place]) 03/25/2016   left MCA    Stroke [facility]) 04/2016   sees Dr. Teodora Medici at [place]  Adopted: Yes  Problem Relation Age of Onset   Heart disease Other        on both sides of family   Diabetes Other        on both sides of family    Past Surgical History:  Procedure Laterality Date   blocked itestinal repair  43   age 56 months   BREAST BIOPSY Left 2017   CHOLECYSTECTOMY  2011   INDUCED ABORTION  1996   forced abortion   INGUINAL HERNIA REPAIR Left 1980   age 27   LAPAROSCOPIC ENDOMETRIOSIS FULGURATION  1998   TOTAL KNEE ARTHROPLASTY Right 07/05/2021   Procedure: TOTAL KNEE ARTHROPLASTY;  Surgeon: Meredith Pel, MD;  Location: [place];  Service: Orthopedics;  Laterality: Right;   WISDOM TOOTH EXTRACTION  2007   Social History   Occupational History   Occupation: Nurse, learning disability  Tobacco Use   Smoking status: Every Day    Packs/day: 0.25    Years: 0.50    Pack years: 0.13    Types: Cigarettes    Start date: 02/24/2018   Smokeless tobacco: Never   Tobacco comments:    has decreased how much - trying to quit  Vaping Use   Vaping Use: Former  Substance and Sexual Activity   Alcohol use: Yes    Comment: rarely   Drug use: Yes    Types: Marijuana    Comment: 2/10 last marijuana use   Sexual activity: Not on file

## 2021-08-22 ENCOUNTER — Ambulatory Visit (HOSPITAL_BASED_OUTPATIENT_CLINIC_OR_DEPARTMENT_OTHER): Payer: BC Managed Care – PPO | Admitting: Certified Registered"

## 2021-08-22 ENCOUNTER — Ambulatory Visit (HOSPITAL_BASED_OUTPATIENT_CLINIC_OR_DEPARTMENT_OTHER)
Admission: RE | Admit: 2021-08-22 | Discharge: 2021-08-22 | Disposition: A | Discharge status: Home / Self Care | Payer: BC Managed Care – PPO | Attending: Orthopedic Surgery | Admitting: Orthopedic Surgery

## 2021-08-22 ENCOUNTER — Other Ambulatory Visit: Payer: Self-pay

## 2021-08-22 ENCOUNTER — Encounter (HOSPITAL_BASED_OUTPATIENT_CLINIC_OR_DEPARTMENT_OTHER): Payer: Self-pay | Admitting: Orthopedic Surgery

## 2021-08-22 ENCOUNTER — Encounter (HOSPITAL_BASED_OUTPATIENT_CLINIC_OR_DEPARTMENT_OTHER)
Admission: RE | Disposition: A | Discharge status: Home / Self Care | Payer: Self-pay | Source: Home / Self Care | Attending: Orthopedic Surgery

## 2021-08-22 DIAGNOSIS — Z791 Long term (current) use of non-steroidal anti-inflammatories (NSAID): Secondary | ICD-10-CM | POA: Insufficient documentation

## 2021-08-22 DIAGNOSIS — E039 Hypothyroidism, unspecified: Secondary | ICD-10-CM | POA: Insufficient documentation

## 2021-08-22 DIAGNOSIS — Z794 Long term (current) use of insulin: Secondary | ICD-10-CM | POA: Insufficient documentation

## 2021-08-22 DIAGNOSIS — M24661 Ankylosis, right knee: Secondary | ICD-10-CM | POA: Diagnosis not present

## 2021-08-22 DIAGNOSIS — E109 Type 1 diabetes mellitus without complications: Secondary | ICD-10-CM

## 2021-08-22 DIAGNOSIS — Z7901 Long term (current) use of anticoagulants: Secondary | ICD-10-CM | POA: Insufficient documentation

## 2021-08-22 DIAGNOSIS — M25661 Stiffness of right knee, not elsewhere classified: Secondary | ICD-10-CM | POA: Diagnosis not present

## 2021-08-22 DIAGNOSIS — E1140 Type 2 diabetes mellitus with diabetic neuropathy, unspecified: Secondary | ICD-10-CM | POA: Insufficient documentation

## 2021-08-22 DIAGNOSIS — Z6838 Body mass index (BMI) 38.0-38.9, adult: Secondary | ICD-10-CM | POA: Insufficient documentation

## 2021-08-22 DIAGNOSIS — Z886 Allergy status to analgesic agent status: Secondary | ICD-10-CM | POA: Insufficient documentation

## 2021-08-22 DIAGNOSIS — K219 Gastro-esophageal reflux disease without esophagitis: Secondary | ICD-10-CM | POA: Insufficient documentation

## 2021-08-22 DIAGNOSIS — Z7902 Long term (current) use of antithrombotics/antiplatelets: Secondary | ICD-10-CM | POA: Insufficient documentation

## 2021-08-22 DIAGNOSIS — Z881 Allergy status to other antibiotic agents status: Secondary | ICD-10-CM | POA: Insufficient documentation

## 2021-08-22 DIAGNOSIS — J45909 Unspecified asthma, uncomplicated: Secondary | ICD-10-CM | POA: Insufficient documentation

## 2021-08-22 DIAGNOSIS — Z7951 Long term (current) use of inhaled steroids: Secondary | ICD-10-CM | POA: Insufficient documentation

## 2021-08-22 DIAGNOSIS — Z880 Allergy status to penicillin: Secondary | ICD-10-CM | POA: Insufficient documentation

## 2021-08-22 DIAGNOSIS — F1290 Cannabis use, unspecified, uncomplicated: Secondary | ICD-10-CM | POA: Insufficient documentation

## 2021-08-22 DIAGNOSIS — E785 Hyperlipidemia, unspecified: Secondary | ICD-10-CM | POA: Insufficient documentation

## 2021-08-22 DIAGNOSIS — G4730 Sleep apnea, unspecified: Secondary | ICD-10-CM | POA: Insufficient documentation

## 2021-08-22 DIAGNOSIS — Z79899 Other long term (current) drug therapy: Secondary | ICD-10-CM | POA: Insufficient documentation

## 2021-08-22 DIAGNOSIS — F17210 Nicotine dependence, cigarettes, uncomplicated: Secondary | ICD-10-CM | POA: Insufficient documentation

## 2021-08-22 DIAGNOSIS — I10 Essential (primary) hypertension: Secondary | ICD-10-CM | POA: Insufficient documentation

## 2021-08-22 DIAGNOSIS — Z96651 Presence of right artificial knee joint: Secondary | ICD-10-CM | POA: Insufficient documentation

## 2021-08-22 HISTORY — PX: KNEE CLOSED REDUCTION: SHX995

## 2021-08-22 LAB — GLUCOSE, CAPILLARY
Glucose-Capillary: 155 mg/dL — ABNORMAL HIGH (ref 70–99)
Glucose-Capillary: 100 mg/dL — ABNORMAL HIGH (ref 70–99)

## 2021-08-22 LAB — POCT PREGNANCY, URINE: Preg Test, Ur: NEGATIVE

## 2021-08-22 SURGERY — MANIPULATION, KNEE, CLOSED
Anesthesia: Regional | Site: Knee | Laterality: Right

## 2021-08-22 MED ORDER — FENTANYL CITRATE (PF) 100 MCG/2ML IJ SOLN
INTRAMUSCULAR | Status: AC
  Filled 2021-08-22: qty 2

## 2021-08-22 MED ORDER — ONDANSETRON HCL 4 MG/2ML IJ SOLN
INTRAMUSCULAR | Status: DC | PRN
  Administered 2021-08-22: 4 mg via INTRAVENOUS

## 2021-08-22 MED ORDER — HYDROMORPHONE HCL 1 MG/ML IJ SOLN
INTRAMUSCULAR | Status: AC
  Filled 2021-08-22: qty 0.5

## 2021-08-22 MED ORDER — LIDOCAINE 2% (20 MG/ML) 5 ML SYRINGE
INTRAMUSCULAR | Status: AC
  Filled 2021-08-22: qty 5

## 2021-08-22 MED ORDER — TRANEXAMIC ACID-NACL 1000-0.7 MG/100ML-% IV SOLN
INTRAVENOUS | Status: AC | PRN
  Administered 2021-08-22: 500 mg via INTRAVENOUS

## 2021-08-22 MED ORDER — TRANEXAMIC ACID-NACL 1000-0.7 MG/100ML-% IV SOLN
INTRAVENOUS | Status: AC
  Filled 2021-08-22: qty 200

## 2021-08-22 MED ORDER — AMISULPRIDE (ANTIEMETIC) 5 MG/2ML IV SOLN: 10.0000 mg | Freq: Once | INTRAVENOUS | Status: DC | PRN

## 2021-08-22 MED ORDER — BUPIVACAINE-EPINEPHRINE (PF) 0.25% -1:200000 IJ SOLN
INTRAMUSCULAR | Status: DC | PRN
  Administered 2021-08-22: 15 mL

## 2021-08-22 MED ORDER — MIDAZOLAM HCL 2 MG/2ML IJ SOLN
2.0000 mg | Freq: Once | INTRAMUSCULAR | Status: AC
  Administered 2021-08-22: 2 mg via INTRAVENOUS

## 2021-08-22 MED ORDER — PROPOFOL 10 MG/ML IV BOLUS
INTRAVENOUS | Status: DC | PRN
  Administered 2021-08-22: 170 mg via INTRAVENOUS

## 2021-08-22 MED ORDER — HYDROMORPHONE HCL 1 MG/ML IJ SOLN
0.2500 mg | INTRAMUSCULAR | Status: DC | PRN
  Administered 2021-08-22 (×2): 0.25 mg via INTRAVENOUS

## 2021-08-22 MED ORDER — ONDANSETRON HCL 4 MG/2ML IJ SOLN
INTRAMUSCULAR | Status: AC
  Filled 2021-08-22: qty 2

## 2021-08-22 MED ORDER — OXYCODONE HCL 5 MG PO TABS: 5.0000 mg | ORAL_TABLET | Freq: Once | ORAL | Status: DC | PRN

## 2021-08-22 MED ORDER — CLONIDINE ORAL SUSPENSION 10 MCG/ML
ORAL | Status: DC | PRN
  Administered 2021-08-22: 75 ug

## 2021-08-22 MED ORDER — POVIDONE-IODINE 10 % EX SWAB: 2.0000 "application " | Freq: Once | CUTANEOUS | Status: DC

## 2021-08-22 MED ORDER — MEPERIDINE HCL 25 MG/ML IJ SOLN: 6.2500 mg | INTRAMUSCULAR | Status: DC | PRN

## 2021-08-22 MED ORDER — PROPOFOL 10 MG/ML IV BOLUS
INTRAVENOUS | Status: AC
  Filled 2021-08-22: qty 20

## 2021-08-22 MED ORDER — PROMETHAZINE HCL 25 MG/ML IJ SOLN: 6.2500 mg | INTRAMUSCULAR | Status: DC | PRN

## 2021-08-22 MED ORDER — MORPHINE SULFATE 8 MG/ML IJ SOLN
INTRAMUSCULAR | Status: DC | PRN
  Administered 2021-08-22: 8 mg

## 2021-08-22 MED ORDER — OXYCODONE HCL 5 MG PO TABS: 5.0000 mg | ORAL_TABLET | Freq: Four times a day (QID) | ORAL | 0 refills | Status: DC | PRN

## 2021-08-22 MED ORDER — DEXAMETHASONE SODIUM PHOSPHATE 10 MG/ML IJ SOLN
INTRAMUSCULAR | Status: AC
  Filled 2021-08-22: qty 1

## 2021-08-22 MED ORDER — MORPHINE SULFATE (PF) 4 MG/ML IV SOLN
INTRAVENOUS | Status: AC
  Filled 2021-08-22: qty 2

## 2021-08-22 MED ORDER — BUPIVACAINE-EPINEPHRINE (PF) 0.25% -1:200000 IJ SOLN
INTRAMUSCULAR | Status: AC
  Filled 2021-08-22: qty 30

## 2021-08-22 MED ORDER — MIDAZOLAM HCL 2 MG/2ML IJ SOLN
INTRAMUSCULAR | Status: AC
  Filled 2021-08-22: qty 2

## 2021-08-22 MED ORDER — OXYCODONE HCL 5 MG/5ML PO SOLN: 5.0000 mg | Freq: Once | ORAL | Status: DC | PRN

## 2021-08-22 MED ORDER — POVIDONE-IODINE 7.5 % EX SOLN: Freq: Once | CUTANEOUS | Status: DC

## 2021-08-22 MED ORDER — ROPIVACAINE HCL 5 MG/ML IJ SOLN
INTRAMUSCULAR | Status: DC | PRN
  Administered 2021-08-22: 30 mL via PERINEURAL

## 2021-08-22 MED ORDER — FENTANYL CITRATE (PF) 100 MCG/2ML IJ SOLN
100.0000 ug | Freq: Once | INTRAMUSCULAR | Status: AC
  Administered 2021-08-22: 100 ug via INTRAVENOUS

## 2021-08-22 MED ORDER — LACTATED RINGERS IV SOLN: INTRAVENOUS | Status: DC

## 2021-08-22 MED ORDER — TRANEXAMIC ACID 1000 MG/10ML IV SOLN
2000.0000 mg | INTRAVENOUS | Status: DC
  Filled 2021-08-22: qty 20

## 2021-08-22 SURGICAL SUPPLY — 10 items
BNDG ELASTIC 6X5.8 VLCR STR LF (GAUZE/BANDAGES/DRESSINGS) ×2 IMPLANT
DURAPREP 26ML APPLICATOR (WOUND CARE) ×2 IMPLANT
GLOVE SURG ENC MOIS LTX SZ7 (GLOVE) ×2 IMPLANT
NDL SAFETY ECLIPSE 18X1.5 (NEEDLE) ×2 IMPLANT
NEEDLE HYPO 18GX1.5 SHARP (NEEDLE) ×4
SYR 20ML LL LF (SYRINGE) ×2 IMPLANT
SYR 50ML LL SCALE MARK (SYRINGE) ×2 IMPLANT
SYR INSULIN 1ML 31GX6 SAFETY (SYRINGE) ×2 IMPLANT
TOWEL GREEN STERILE FF (TOWEL DISPOSABLE) ×2 IMPLANT
TRAY DSU PREP LF (CUSTOM PROCEDURE TRAY) ×2 IMPLANT

## 2021-08-22 NOTE — Progress Notes (Signed)
AssistedDr. Miller with right, ultrasound guided, adductor canal block. Side rails up, monitors on throughout procedure. See vital signs in flow sheet. Tolerated Procedure well.  

## 2021-08-22 NOTE — Transfer of Care (Signed)
Immediate Anesthesia Transfer of Care Note  Patient: Miranda [name]  Procedure(s) Performed: RIGHT  KNEE MANIPULATION UNDER ANESTHESIA (Right: Knee)  Patient Location: PACU  Anesthesia Type:GA combined with regional for post-op pain  Level of Consciousness: drowsy and patient cooperative  Airway & Oxygen Therapy: Patient Spontanous Breathing and Patient connected to face mask oxygen  Post-op Assessment: Report given to RN and Post -op Vital signs reviewed and stable  Post vital signs: Reviewed and stable  Last Vitals:  Vitals Value Taken Time  BP    Temp    Pulse 72 08/22/21 1208  Resp    SpO2 99 % 08/22/21 1208  Vitals shown include unvalidated device data.  Last Pain:  Vitals:   08/22/21 0959  TempSrc: Oral  PainSc: 6          Complications: No notable events documented.

## 2021-08-22 NOTE — Anesthesia Preprocedure Evaluation (Addendum)
Anesthesia Evaluation  Patient identified by MRN, date of birth, ID band Patient awake    Reviewed: Allergy & Precautions, NPO status   Airway Mallampati: III  TM Distance: >3 FB Neck ROM: Full    Dental no notable dental hx.    Pulmonary asthma , sleep apnea , Current Smoker and Patient abstained from smoking.,    Pulmonary exam normal breath sounds clear to auscultation       Cardiovascular hypertension, Normal cardiovascular exam+ dysrhythmias  Rhythm:Regular Rate:Normal     Neuro/Psych  Headaches, PSYCHIATRIC DISORDERS Anxiety Depression TIA Neuromuscular disease CVA    GI/Hepatic GERD  ,  Endo/Other  diabetes, Type 2Hypothyroidism Morbid obesity  Renal/GU   negative genitourinary   Musculoskeletal  (+) Arthritis ,   Abdominal (+) + obese,   Peds  Hematology   Anesthesia Other Findings   Reproductive/Obstetrics negative OB ROS                             Anesthesia Physical  Anesthesia Plan  ASA: 3  Anesthesia Plan: Regional and General   Post-op Pain Management:    Induction: Intravenous  PONV Risk Score and Plan: 2 and Treatment may vary due to age or medical condition, Midazolam and Ondansetron  Airway Management Planned: Mask  Additional Equipment: None  Intra-op Plan:   Post-operative Plan: Extubation in OR  Informed Consent: I have reviewed the patients History and Physical, chart, labs and discussed the procedure including the risks, benefits and alternatives for the proposed anesthesia with the patient or authorized representative who has indicated his/her understanding and acceptance.     Dental advisory given  Plan Discussed with: Anesthesiologist and CRNA  Anesthesia Plan Comments: (     PAT note by Karoline Caldwell, PA-C: Follows with neurology for history of CVA 2017, OSA on BiPAP, headaches.  She is maintained on Plavix for secondary stroke prevention.   He was cleared by neurology to hold Plavix for 7 days for upcoming surgery.  Patient reported last dose Plavix 06/29/2021.  Follows with cardiology for history of HTN, PVCs, chest pain.  She was last seen 06/17/2021 and reported atypical chest pain.  Stress test was ordered to rule out ischemia.  Note states that if the stress test is normal she will be okay to proceed with surgery from a cardiac standpoint.  Stress test was done 06/23/21 and was nonischemic, ef 58%. Full results below.  Follows with endocrinology at [facility] for IDDM 2, maintained on insulin pump.  Last A1c 5.8 on 05/30/2021 per office visit note same date.  Preop labs reviewed, mild hypokalemia with potassium 3.3, otherwise unremarkable.  EKG 06/17/2021 (copy on chart): Sinus rhythm.  Rate 81.  Nuclear stress 06/23/2021 (copy on chart): Impressions: 1.  Negative pharmacologic stress test for the detection of ischemia, but abnormal due to diaphragmatic attenuation.  2.  Normal SPECT perfusion imaging with artifact: Diaphragmatic attenuation. 3.  No significant ischemia detected. 4.  Normal left ventricular systolic function. 5.  The calculated ejection fraction of 58%.  TTE 02/11/21 (Care Everywhere): SUMMARY  [place] SPS 04/28/20.  Image Quality: Technically difficult.  The left ventricular size is normal.  There is normal left ventricular wall thickness.  Left ventricular systolic function is normal.  LV ejection fraction = 60-65%.  Left ventricular filling pattern is indeterminate.  The left ventricular wall motion is normal.   Exercise stress test 01/08/2019 (Care Everywhere): Procedure:  The patient exercised for 4  minutes on the 2 minute Bruce protocol, achieving 6.9 METS.   The peak heart rate was 169 bpm, achieving 96 % of MPHR   The resting blood pressure was 136/98 mmHg and peak blood pressure was 210/100 mmHg.   Reason for termination: Target heart rate completed  The heart rate response normal  The blood pressure  response was abnormal  There was no chest pain  ST Changes: There were no significant ST-T wave changes  Arrhythmias: None   Conclusion:   Normal exercise treadmill stress test   )       Anesthesia Quick Evaluation

## 2021-08-22 NOTE — Brief Op Note (Signed)
   08/22/2021  12:55 PM  PATIENT:  Miranda [name]  45 y.o. female  PRE-OPERATIVE DIAGNOSIS:  RIGHT KNEE STIFFNESS  POST-OPERATIVE DIAGNOSIS:  RIGHT KNEE STIFFNESS  PROCEDURE:  Procedure(s): RIGHT  KNEE MANIPULATION UNDER ANESTHESIA  SURGEON:  Surgeon(s): Marlou Sa, Tonna Corner, MD  ASSISTANT: magnant pa  ANESTHESIA:   general  EBL: 0 ml    Total I/O In: 300 [I.V.:300] Out: -   BLOOD ADMINISTERED: none  DRAINS: none   LOCAL MEDICATIONS USED:  exparel marcaine mso4 clonidine  SPECIMEN:  No Specimen  COUNTS:  YES  TOURNIQUET:  * No tourniquets in log *  DICTATION: .Other Dictation: Dictation Number [id number]  PLAN OF CARE: Discharge to home after PACU  PATIENT DISPOSITION:  PACU - hemodynamically stable

## 2021-08-22 NOTE — Anesthesia Procedure Notes (Signed)
Procedure Name: General with mask airway Date/Time: 08/22/2021 11:47 AM Performed by: Signe Colt, CRNA Pre-anesthesia Checklist: Patient identified, Emergency Drugs available, Suction available, Patient being monitored and Timeout performed Patient Re-evaluated:Patient Re-evaluated prior to induction Oxygen Delivery Method: Circle system utilized

## 2021-08-22 NOTE — Discharge Instructions (Signed)

## 2021-08-22 NOTE — Anesthesia Procedure Notes (Signed)
Anesthesia Regional Block: Adductor canal block   Pre-Anesthetic Checklist: , timeout performed,  Correct Patient, Correct Site, Correct Laterality,  Correct Procedure, Correct Position, site marked,  Risks and benefits discussed,  Surgical consent,  Pre-op evaluation,  At surgeon's request and post-op pain management  Laterality: Right  Prep: chloraprep       Needles:  Injection technique: Single-shot  Needle Type: Stimiplex     Needle Length: 9cm  Needle Gauge: 21     Additional Needles:   Procedures:,,,, ultrasound used (permanent image in chart),,    Narrative:  Start time: 08/22/2021 10:21 AM End time: 08/22/2021 10:26 AM Injection made incrementally with aspirations every 5 mL.  Performed by: Personally  Anesthesiologist: Lynda Rainwater, MD

## 2021-08-22 NOTE — H&P (Signed)
Miranda [name] is an 45 y.o. female.   Chief Complaint: Right knee stiffness  HPI: Miranda [name] is a 45 year old patient with right knee pain.  Underwent right total knee replacement about 6 weeks ago.  Initially did well but has been having struggles lately in terms of getting her motion.  Denies any fevers or chills.  Presents now for manipulation under anesthesia to enhance functional recovery.  Past Medical History:  Diagnosis Date   Anginal pain ([place])    Anxiety    Arthritis    Asthma    Bowel obstruction ([place])    Cardiac arrhythmia    Depression    sees Dr. Matilde Haymaker in [place]    Diabetes mellitus    sees Dr. Madelin Rear    Dyspnea    Dysrhythmia    GERD (gastroesophageal reflux disease)    Hyperlipidemia    Hypertension    Hypothyroidism    sees Dr. Elyse Hsu   Insomnia    Migraine syndrome    se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 08/2017   Stroke ([place]) 03/25/2016   left MCA    Stroke [facility]) 04/2016   sees Dr. Teodora Medici at [facility]     Past Surgical History:  Procedure Laterality Date   blocked itestinal repair  1975   age 89 months   BREAST BIOPSY Left 2017   CHOLECYSTECTOMY  2011   INDUCED ABORTION  1996   forced abortion   INGUINAL HERNIA REPAIR Left 1980   age 58   LAPAROSCOPIC ENDOMETRIOSIS FULGURATION  1998   TOTAL KNEE ARTHROPLASTY Right 07/05/2021   Procedure: TOTAL KNEE ARTHROPLASTY;  Surgeon: Meredith Pel, MD;  Location: [place];  Service: Orthopedics;  Laterality: Right;   WISDOM TOOTH EXTRACTION  2007    Family History  Adopted: Yes  Problem Relation Age of Onset   Heart disease Other        on both sides of family   Diabetes Other        on both sides of family   Social History:  reports  that she has been smoking cigarettes. She started smoking about 3 years ago. She has a 0.13 pack-year smoking history. She has never used smokeless tobacco. She reports current alcohol use. She reports current drug use. Drug: Marijuana.  Allergies:  Allergies  Allergen Reactions   Macrobid [Nitrofurantoin] Hives   Metformin And Related Other (See Comments)    Abdominal cramping    Byetta 10 Mcg Pen [Exenatide] Hives   Clindamycin/Lincomycin Nausea And Vomiting   Geodon [Ziprasidone Hcl]     Body starts shutting down   Lipitor [Atorvastatin]     Per patient this causes cramps   Nsaids     Heartburn   Penicillins Other (See Comments)    Childhood reaction Has patient had a PCN reaction causing immediate rash, facial/tongue/throat swelling, SOB or lightheadedness with hypotension: No Has patient had a PCN reaction causing severe rash involving mucus membranes or skin necrosis: No Has patient had a PCN reaction that required hospitalization No Has patient had a PCN reaction occurring within the last 10 years: No If all of the above answers are "NO", then may proceed with Cephalosporin use.  Trulicity [Dulaglutide]     Abdominal pain    Avocado Diarrhea and Other (See Comments)    Severe cramping, sweats, and diarrhea in upper GI/stomach   Tramadol Hcl Itching and Rash    Medications Prior to Admission  Medication Sig Dispense Refill   acetaminophen (TYLENOL) 325 MG tablet Take 2 tablets (650 mg total) by mouth every 6 (six) hours as needed for mild pain (pain score 1-3 or temp > 100.5). 30 tablet 0   ARIPiprazole (ABILIFY) 30 MG tablet Take 30 mg by mouth every evening.     buPROPion (WELLBUTRIN XL) 150 MG 24 hr tablet Take 150-300 mg by mouth See admin instructions. 300 mg in the morning, 150 mg in the evening     clopidogrel (PLAVIX) 75 MG tablet TAKE ONE TABLET BY MOUTH DAILY (Patient taking differently: Take 75 mg by mouth daily.) 90 tablet 0   docusate sodium (COLACE) 100 MG  capsule Take 1 capsule (100 mg total) by mouth 2 (two) times daily. 10 capsule 0   famotidine (PEPCID) 40 MG tablet Take 40 mg by mouth 2 (two) times daily.     gabapentin (NEURONTIN) 400 MG capsule Take 400 mg by mouth 3 (three) times daily.     hydrOXYzine (VISTARIL) 50 MG capsule Take 50 mg by mouth 3 (three) times daily.     ibuprofen (ADVIL) 200 MG tablet Take 800 mg by mouth in the morning and at bedtime.     insulin aspart (NOVOLOG) 100 UNIT/ML injection Use in the insulin pump as directed 10 mL 0   levothyroxine (SYNTHROID, LEVOTHROID) 125 MCG tablet Take 1 tablet (125 mcg total) by mouth daily before breakfast. 90 tablet 3   lisinopril (ZESTRIL) 10 MG tablet Take 10 mg by mouth daily.     loratadine (CLARITIN) 10 MG tablet Take 10 mg by mouth daily as needed for allergies.     metoprolol succinate (TOPROL-XL) 25 MG 24 hr tablet Take 0.5 tablets (12.5 mg total) by mouth daily. (Patient taking differently: Take 25 mg by mouth daily.) 30 tablet 0   montelukast (SINGULAIR) 10 MG tablet Take 10 mg by mouth daily.     Multiple Vitamin (MULTIVITAMIN) tablet Take 1 tablet by mouth daily.     nystatin [facility]) powder Apply topically QID. (Patient taking differently: Apply 1 application topically 2 (two) times daily as needed (yeast/ dry skin).) 60 g 0   oxyCODONE (OXY IR/ROXICODONE) 5 MG immediate release tablet Take 1 tablet (5 mg total) by mouth every 6 (six) hours as needed for moderate pain (pain score 4-6). 30 tablet 0   OZEMPIC, 0.25 OR 0.5 MG/DOSE, 2 MG/1.5ML SOPN Inject 0.5 mg into the skin every Sunday.     pantoprazole (PROTONIX) 40 MG tablet Take 1 tablet (40 mg total) by mouth 2 (two) times daily. 180 tablet 2   prazosin (MINIPRESS) 5 MG capsule Take 10 mg by mouth at bedtime.     rosuvastatin (CRESTOR) 20 MG tablet Take 1 tablet (20 mg total) by mouth daily. 30 tablet 6   topiramate (TOPAMAX) 100 MG tablet Take 100 mg by mouth 2 (two) times daily.      traZODone (DESYREL) 150 MG  tablet Take 150 mg by mouth at bedtime.     albuterol (PROAIR HFA) 108 (90 Base) MCG/ACT inhaler Inhale 2 puffs into the lungs every 4 (four) hours as needed for wheezing. 8.5 g 11   baclofen (LIORESAL) 10 MG tablet Take 10 mg by mouth every 4 (four) hours  as needed (headaches).     budesonide-formoterol (SYMBICORT) 160-4.5 MCG/ACT inhaler Inhale 2 puffs into the lungs 2 (two) times daily. (Patient taking differently: Inhale 2 puffs into the lungs 2 (two) times daily as needed (asthma).) 1 Inhaler 11   diclofenac Sodium (VOLTAREN) 1 % GEL Apply 1 application topically 2 (two) times daily as needed (pain).     diphenoxylate-atropine (LOMOTIL) 2.5-0.025 MG tablet Take 1 tablet by mouth every 6 (six) hours as needed for diarrhea or loose stools. (Patient taking differently: Take 2 tablets by mouth every 6 (six) hours as needed for diarrhea or loose stools.) 30 tablet 0   fluticasone (FLONASE) 50 MCG/ACT nasal spray Place 2 sprays into both nostrils daily as needed for allergies or rhinitis.     Glucagon (BAQSIMI ONE PACK) 3 MG/DOSE POWD Place 3 mg into the nose daily as needed (low blood sugar).     hyoscyamine (LEVSIN SL) 0.125 MG SL tablet Place 1 tablet (0.125 mg total) under the tongue every 6 (six) hours as needed. 30 tablet 1   Menthol, Topical Analgesic, (BIOFREEZE) 10 % LIQD Apply 1 application topically 2 (two) times daily as needed (pain).     metoCLOPramide (REGLAN) 5 MG tablet Take 0.5 tablets (2.5 mg total) by mouth 4 (four) times daily -  before meals and at bedtime. 60 tablet 1   ondansetron (ZOFRAN-ODT) 4 MG disintegrating tablet Take 4 mg by mouth every 8 (eight) hours as needed for nausea or vomiting.     polyethylene glycol powder (GLYCOLAX/MIRALAX) 17 GM/SCOOP powder Use 1 capful twice daily in 8 ounces of fluid 510 g 3   promethazine (PHENERGAN) 25 MG tablet Take 1 tablet (25 mg total) by mouth every 4 (four) hours as needed for nausea or vomiting. (Patient taking differently: Take 25  mg by mouth every 6 (six) hours as needed for nausea or vomiting.) 60 tablet 1   Rimegepant Sulfate (NURTEC) 75 MG TBDP Take 75 mg by mouth daily as needed (migraines).     rivaroxaban (XARELTO) 10 MG TABS tablet Take 1 tablet (10 mg total) by mouth daily. 15 tablet 0   terconazole (TERAZOL 7) 0.4 % vaginal cream Place 1 applicator vaginally at bedtime as needed (yeast infections).     Ubrogepant (UBRELVY) 100 MG TABS Take 100 mg by mouth daily as needed (migraines). May repeat in 2 hours as needed. No more than 200 mg in 24 hours     valACYclovir (VALTREX) 1000 MG tablet Take 1 tablet (1,000 mg total) by mouth 3 (three) times daily. (Patient taking differently: Take 1,000 mg by mouth 2 (two) times daily as needed (breakouts).) 30 tablet 0    Results for orders placed or performed during the hospital encounter of 08/22/21 (from the past 48 hour(s))  Pregnancy, urine POC     Status: None   Collection Time: 08/22/21  9:29 AM  Result Value Ref Range   Preg Test, Ur NEGATIVE NEGATIVE    Comment:        THE SENSITIVITY OF THIS METHODOLOGY IS >24 mIU/mL   Glucose, capillary     Status: Abnormal   Collection Time: 08/22/21  9:52 AM  Result Value Ref Range   Glucose-Capillary 155 (H) 70 - 99 mg/dL    Comment: Glucose reference range applies only to samples taken after fasting for at least 8 hours.   *Note: Due to a large number of results and/or encounters for the requested time period, some results have not been displayed. A complete  set of results can be found in Results Review.   No results found.  Review of Systems  Musculoskeletal:  Positive for arthralgias.  All other systems reviewed and are negative.  Blood pressure 98/65, pulse 71, temperature 97.6 F (36.4 C), temperature source Oral, resp. rate 15, height 5\' 1"  (1.549 m), weight 92.3 kg, last menstrual period 08/01/2021, SpO2 96 %. Physical Exam Vitals reviewed.  HENT:     Head: Normocephalic.     Nose: Nose normal.      Mouth/Throat:     Mouth: Mucous membranes are moist.  Eyes:     Pupils: Pupils are equal, round, and reactive to light.  Cardiovascular:     Rate and Rhythm: Normal rate.     Pulses: Normal pulses.  Pulmonary:     Effort: Pulmonary effort is normal.  Abdominal:     General: Abdomen is flat.  Musculoskeletal:     Cervical back: Normal range of motion.  Skin:    General: Skin is warm.     Capillary Refill: Capillary refill takes less than 2 seconds.  Neurological:     General: No focal deficit present.     Mental Status: She is alert.  Psychiatric:        Mood and Affect: Mood normal.    Examination of the right knee demonstrates well-healed surgical incision range of motion is 8 to 80 degrees.  Collaterals are stable.  Extensor mechanism is intact.  Pedal pulses palpable. Assessment/Plan Impression is right knee arthrofibrosis following total knee replacement plan is manipulation under anesthesia with intra-articular injection of TXA Marcaine morphine and clonidine for postop pain relief.  Risk and benefits are discussed with the patient including not limited to infection nerve vessel damage fracture as well as incomplete restoration of functional range of motion.  Patient understands and wishes to proceed.  All questions answered  Anderson Malta, MD 08/22/2021, 11:31 AM

## 2021-08-23 ENCOUNTER — Ambulatory Visit: Payer: BC Managed Care – PPO | Admitting: Physical Therapy

## 2021-08-23 ENCOUNTER — Encounter (HOSPITAL_BASED_OUTPATIENT_CLINIC_OR_DEPARTMENT_OTHER): Payer: Self-pay | Admitting: Orthopedic Surgery

## 2021-08-23 DIAGNOSIS — R600 Localized edema: Secondary | ICD-10-CM

## 2021-08-23 DIAGNOSIS — R2689 Other abnormalities of gait and mobility: Secondary | ICD-10-CM

## 2021-08-23 DIAGNOSIS — M6281 Muscle weakness (generalized): Secondary | ICD-10-CM

## 2021-08-23 DIAGNOSIS — M25561 Pain in right knee: Secondary | ICD-10-CM

## 2021-08-23 DIAGNOSIS — M25661 Stiffness of right knee, not elsewhere classified: Secondary | ICD-10-CM

## 2021-08-23 MED FILL — Morphine Sulfate Inj 4 MG/ML: INTRAMUSCULAR | Qty: 2 | Status: AC

## 2021-08-23 NOTE — Op Note (Signed)
NAME: Miranda [name], CUMMINGS MEDICAL RECORD NO: [id number] ACCOUNT NO: [id number] DATE OF BIRTH: 12-04-1975 FACILITY: [facility] LOCATION: [facility] PHYSICIAN: Yetta Barre. Marlou Sa, MD  Operative Report   DATE OF PROCEDURE: 08/22/2021  PREOPERATIVE DIAGNOSIS:  Right knee stiffness, status post total knee arthroplasty.  POSTOPERATIVE DIAGNOSIS:  Right knee stiffness, status post total knee arthroplasty.  PROCEDURE:  Right knee manipulation under anesthesia with injection post-manipulation.  SURGEON:  Attending, Marcene Duos, MD  ASSISTANT:  Annie Main.  INDICATIONS:  The patient is a 45 year old patient who is 6 weeks out right total knee replacement, who has had knee stiffness develop. Presents now for operative management after failure of conservative management and failure to progress in physical  therapy past 90 degrees of flexion.  DESCRIPTION OF PROCEDURE:  The patient was brought to the operating room where general anesthetic was induced.  Preoperative IV antibiotics were not administered.  Timeout was called.  The right knee initially had range of motion of about 80 degrees of  flexion.  Minimizing the fulcrum distance, the knee was manipulated into 120 degrees of flexion.  Patella was mobilized as well.  At this time, the lateral aspect of the knee was pre-scrubbed with alcohol and Betadine, allowed to air dry, prepped with  DuraPrep solution. A solution of TXA, Marcaine, morphine, clonidine injected into the knee for postop pain relief.  The patient tolerated the procedure well without immediate complications, transferred to the recovery room in stable condition.   Weightbearing as tolerated with CPM as much as tolerated and knee range of motion as much as tolerated with therapy if the CPM machine is not available.  Luke's assistance was required for manipulation and injection.  His assistance was a medical  necessity.   SHW D: 08/22/2021 12:58:31 pm T: 08/23/2021 1:49:00 am  JOB:  [id number]/ [id number]

## 2021-08-23 NOTE — Addendum Note (Signed)
Addendum  created 08/23/21 0931 by Darral Dash, DO   Attestation recorded in Intraprocedure, Clinical Note Signed, [place] filed

## 2021-08-23 NOTE — Anesthesia Postprocedure Evaluation (Signed)
Anesthesia Post Note  Patient: Miranda [name]  Procedure(s) Performed: RIGHT  KNEE MANIPULATION UNDER ANESTHESIA (Right: Knee)     Patient location during evaluation: PACU Anesthesia Type: Regional and MAC Level of consciousness: awake and alert Pain management: pain level controlled Vital Signs Assessment: post-procedure vital signs reviewed and stable Respiratory status: spontaneous breathing, nonlabored ventilation, respiratory function stable and patient connected to nasal cannula oxygen Cardiovascular status: stable and blood pressure returned to baseline Postop Assessment: no apparent nausea or vomiting Anesthetic complications: no   No notable events documented.  Last Vitals:  Vitals:   08/22/21 1246 08/22/21 1316  BP: 114/76 119/77  Pulse: 73 75  Resp: 17 20  Temp:  36.6 C  SpO2: 96% 99%    Last Pain:  Vitals:   08/22/21 1316  TempSrc: Oral  PainSc: 0-No pain                 Belenda Cruise P Linell Shawn

## 2021-08-23 NOTE — Anesthesia Postprocedure Evaluation (Signed)
Anesthesia Post Note  Patient: Miranda [name]  Procedure(s) Performed: RIGHT  KNEE MANIPULATION UNDER ANESTHESIA (Right: Knee)     Patient location during evaluation: PACU Anesthesia Type: Regional and General Level of consciousness: awake and alert Pain management: pain level controlled Vital Signs Assessment: post-procedure vital signs reviewed and stable Respiratory status: spontaneous breathing, nonlabored ventilation, respiratory function stable and patient connected to nasal cannula oxygen Cardiovascular status: blood pressure returned to baseline and stable Postop Assessment: no apparent nausea or vomiting Anesthetic complications: no   No notable events documented.  Last Vitals:  Vitals:   08/22/21 1246 08/22/21 1316  BP: 114/76 119/77  Pulse: 73 75  Resp: 17 20  Temp:  36.6 C  SpO2: 96% 99%    Last Pain:  Vitals:   08/22/21 1316  TempSrc: Oral  PainSc: 0-No pain                 Belenda Cruise P [place] Worley

## 2021-08-23 NOTE — Therapy (Signed)
[facility] [address]  [place], [place], [address] Phone: [phone]   Fax:  [phone]  Physical Therapy Treatment/Progress Note  Progress Note Reporting Period 08/02/2021 to 08/23/2021  See note below for Objective Data and Assessment of Progress/Goals.     Patient Details  Name: ANAHIT KLUMB MRN: [id number] Date of Birth: 1976-03-02 Referring Provider (PT): Marcene Duos   Encounter Date: 08/23/2021   PT End of Session - 08/23/21 1022     Visit Number 8    Number of Visits 30    Date for PT Re-Evaluation 09/27/21    Authorization Type BC[employer]    Authorization - Number of Visits 60    Progress Note Due on Visit 10    PT Start Time 1015    PT Stop Time 1108    PT Time Calculation (min) 53 min    Activity Tolerance Patient tolerated treatment well;Patient limited by pain    Behavior During Therapy [facility] for tasks assessed/performed             Past Medical History:  Diagnosis Date   Anginal pain ([place])    Anxiety    Arthritis    Asthma    Bowel obstruction ([place])    Cardiac arrhythmia    Depression    sees Dr. Matilde Haymaker in [place]    Diabetes mellitus    sees Dr. Madelin Rear    Dyspnea    Dysrhythmia    GERD (gastroesophageal reflux disease)    Hyperlipidemia    Hypertension    Hypothyroidism    sees Dr. Elyse Hsu   Insomnia    Migraine syndrome    sees Dr. 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 08/2017   Stroke ([place]) 03/25/2016   left MCA    Stroke [facility]) 04/2016   sees Dr. Teodora Medici at [name]     Past Surgical History:  Procedure Laterality Date   blocked itestinal repair  1975   age 45   BREAST BIOPSY Left 2017    CHOLECYSTECTOMY  2011   INDUCED ABORTION  1996   forced abortion   INGUINAL HERNIA REPAIR Left 1980   age 45   KNEE CLOSED REDUCTION Right 08/22/2021   Procedure: RIGHT  KNEE MANIPULATION UNDER ANESTHESIA;  Surgeon: Meredith Pel, MD;  Location: [facility];  Service: Orthopedics;  Laterality: Right;   LAPAROSCOPIC ENDOMETRIOSIS FULGURATION  1998   TOTAL KNEE ARTHROPLASTY Right 07/05/2021   Procedure: TOTAL KNEE ARTHROPLASTY;  Surgeon: Meredith Pel, MD;  Location: [facility];  Service: Orthopedics;  Laterality: Right;   WISDOM TOOTH EXTRACTION  2007    There were no vitals filed for this visit.   Subjective Assessment - 08/23/21 1020     Subjective Patient had MUA yesterday, reports able to bend knee easier, has started using CPM machine again, getting up to 100 deg, but did not use this morning yet, so knee stiff.    Pertinent History depression, PTSD,migraine/ TIA (loss of sensation on L side), L ischemic stroke, T2DM, irregular heartbeat, gastroparesis, IBS, asthma, GERD    Limitations Sitting;Standing;Walking;House hold activities    How long can you sit comfortably?  5 minutes if not in lazy boy    How long can you stand comfortably? a while, more comfortable than sitting, 10-15 min    How long can you walk comfortably? most comfortable, 20 min    Diagnostic tests X-ray    Patient Stated Goals normal function on knee, get it strainght and get it bent    Currently in Pain? Yes    Pain Score 5     Pain Location Knee    Pain Orientation Right    Pain Descriptors / Indicators Aching    Pain Onset 1 to 4 weeks ago                [facility] PT Assessment - 08/23/21 0001       Assessment   Medical Diagnosis Z98.890 (ICD-10-CM) - Post-operative state R TKA    Referring Provider (PT) Marcene Duos    Onset Date/Surgical Date 07/05/21   MUA on 08/22/21   Next MD Visit 08/31/2021      Precautions   Precautions None      Restrictions   Weight Bearing  Restrictions No      AROM   Right Knee Extension 10    Right Knee Flexion 91      PROM   Right Knee Extension 10    Right Knee Flexion 96                           [facility] Adult PT Treatment/Exercise - 08/23/21 0001       Exercises   Exercises Knee/Hip      Knee/Hip Exercises: Aerobic   Stationary Bike partial revolutions x 6 min      Knee/Hip Exercises: Supine   Heel Slides AAROM;Right;10 reps    Heel Slides Limitations with strap and feet on towel with PT assist    Straight Leg Raises Strengthening;Right;10 reps    Patellar Mobs focusing on inferior glides due to quad tightness superiorly      Knee/Hip Exercises: Prone   Hamstring Curl 10 reps    Hamstring Curl Limitations contract relax    Hip Extension Strengthening;Right;10 reps    Hip Extension Limitations with hip hyperextended to start on towel    Contract/Relax to Increase Flexion 6 x 6 sec holds      Modalities   Modalities Vasopneumatic      Vasopneumatic   Number Minutes Vasopneumatic  10 minutes    Vasopnuematic Location  Knee    Vasopneumatic Pressure Low    Vasopneumatic Temperature  34      Manual Therapy   Manual Therapy Joint mobilization;Soft tissue mobilization    Manual therapy comments to improve R knee flexion and extension    Joint Mobilization PA mobs tibia on femur grade III in supine, patellar mobs, distraction/glide/and IR in sitting into flexion    Passive ROM knee flexion stretch in prone with hip hyperextended, starting with rotational movements to relax glut followed by knee flexion, tolerated better able to flex knee to 90 in prone after                       PT Short Term Goals - 08/11/21 1518       PT SHORT TERM GOAL #1   Title Ind with initial HEP    Time 2    Period Weeks    Status Achieved    Target Date 08/16/21  PT Long Term Goals - 08/23/21 1153       PT LONG TERM GOAL #1   Title Ind with progressed HEP for LE  strengthening/gait    Time 8    Period Weeks    Status On-going   met for current   Target Date 09/27/21      PT LONG TERM GOAL #2   Title Pt. will improve R knee extension to no more than lacking 5 deg full extension for safety with gait.    Baseline lacking 20 deg R knee extension.    Time 8    Period Weeks    Status On-going   10/13- lacking 7 degrees 10/25- lacking 10 deg   Target Date 09/27/21      PT LONG TERM GOAL #3   Title Pt. will demonstrate 115 deg R knee flexion to be able to perform activities safely    Baseline 80 deg R knee flexion    Time 8    Period Weeks    Status On-going   10/13- 90 deg 10/25- 96 deg   Target Date 09/27/21      PT LONG TERM GOAL #4   Title Pt. will be able to ambulate 1000' without AD safely without limitations from pain to access community.    Baseline 2WRW, slow antalgic gait    Time 8    Period Weeks    Status On-going   10/13- using [facility]   Target Date 09/27/21      PT LONG TERM GOAL #5   Title Patient will demonstrate improved functional LE strength by completing 5xSTS < 14 seconds.    Baseline 24 seconds.    Time 8    Period Weeks    Status On-going    Target Date 09/27/21      PT LONG TERM GOAL #6   Title Pt. will report no more than 2/10 R knee pain with activity.    Baseline 5-8/10 depending on activity    Time 8    Period Weeks    Status On-going   10/25- 5/10 with bike   Target Date 09/27/21                   Plan - 08/23/21 1023     Clinical Impression Statement Patient reports good outcome from her MUA.  Today she demonstrated significant improvement in both ROM and tolerance to manual therapy and exercise.  On bike she was able to flex R knee to 91 at start of session, post session able to flex 96 degress.   Still lacking  10 deg of extension at end of session.  Due to having the MUA, increasing visits to daily over the next 2 weeks to gain as much ROM as possible, then returning to 2-3x as appropriate.     Personal Factors and Comorbidities Comorbidity 3+    Comorbidities depression, PTSD, obesity, TIA/migraine, T2DM, hypertension, HT, gastroparesis, IBS, asthma, GERD, L knee OA, history ischemic stroke, heart irregularities    PT Frequency 5x / week   5x/week for 1-2 weeks, decreasing to 2-3x/week as appropriate   PT Duration 8 weeks    PT Treatment/Interventions ADLs/Self Care Home Management;Cryotherapy;Electrical Stimulation;Iontophoresis 71m/ml Dexamethasone;Moist Heat;Ultrasound;Gait training;Stair training;Functional mobility training;Therapeutic activities;Therapeutic exercise;Balance training;Neuromuscular re-education;Patient/family education;Manual techniques;Scar mobilization;Passive range of motion;Dry needling;Taping;Vasopneumatic Device;Joint Manipulations    PT Next Visit Plan progress R knee ROM and stregth as tolerated.    PT Home Exercise Plan Access Code: [email or web address]    [email or web address]  Agree with Plan of Care Patient             Patient will benefit from skilled therapeutic intervention in order to improve the following deficits and impairments:  Abnormal gait, Decreased activity tolerance, Decreased endurance, Decreased range of motion, Decreased skin integrity, Decreased strength, Hypomobility, Increased fascial restricitons, Impaired sensation, Pain, Decreased mobility, Decreased scar mobility, Difficulty walking, Increased edema, Increased muscle spasms, Impaired flexibility  Visit Diagnosis: Acute pain of right knee  Stiffness of right knee, not elsewhere classified  Other abnormalities of gait and mobility  Muscle weakness (generalized)  Localized edema     Problem List Patient Active Problem List   Diagnosis Date Noted   Arthritis of right knee    Diabetic gastroparesis ([place]) 07/20/2021   Constipation 07/20/2021   S/P total knee arthroplasty, right 07/05/2021   Chronic diarrhea 04/29/2021   Belching 04/29/2021   Primary osteoarthritis of both knees  10/28/2020   Irritable bowel syndrome with diarrhea 10/16/2019   Tendinitis of right rotator cuff 09/17/2019   Hand pain, right 05/21/2019   Labral tear of hip joint 05/21/2019   Type 2 diabetes mellitus with diabetic neuropathy, unspecified ([place]) 10/01/2018   Right hip pain 05/22/2018   [place] Must Quervain's disease (radial styloid tenosynovitis) 04/02/2018   Carpal tunnel syndrome, right upper limb 12/31/2017   Elbow injury, right, initial encounter 12/16/2017   Chronic pain of both shoulders 11/30/2017   PTSD (post-traumatic stress disorder) 10/25/2016   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[place]) 10/25/2016   Diabetes mellitus ([place]) 10/25/2016   Major depressive disorder, recurrent episode, severe, with psychosis ([place]) 10/20/2016   Major depressive disorder, recurrent episode, severe, 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[place]) 10/19/2016   TIA (transient ischemic attack) 05/16/2016   Arterial ischemic stroke, MCA, left, acute ([place]) 03/29/2016   Plantar fasciitis 08/08/2013   Obesity, Class III, BMI 40-49.9 (morbid obesity) ([place]) 04/09/2013   Unspecified sleep apnea 03/31/2013   PCO (polycystic ovaries) 04/11/2012   Neck pain 11/21/2010   FATTY LIVER DISEASE 01/26/2010   NAUSEA WITH VOMITING 01/26/2010   PORTAL HYPERTENSION 01/26/2010   Migraine headache 06/28/2009   BREAST MASS 08/21/2008   KNEE PAIN 04/14/2008   Hypothyroidism 03/17/2008   Hyperlipidemia 03/17/2008   DEPRESSION 03/17/2008   NEUROPATHY 03/17/2008   Essential hypertension 03/17/2008   ASTHMA 03/17/2008   GERD 03/17/2008   Headache 03/17/2008    Rennie Natter, PT, DPT 08/23/2021, 12:04 PM  [facility] [address]  [place], [place], [address] Phone: [phone]   Fax:  [phone]  Name: AUSTEN WYGANT MRN: [id number] Date of Birth: [birth year]

## 2021-08-24 ENCOUNTER — Ambulatory Visit: Payer: BC Managed Care – PPO

## 2021-08-24 ENCOUNTER — Other Ambulatory Visit: Payer: Self-pay

## 2021-08-24 DIAGNOSIS — M25561 Pain in right knee: Secondary | ICD-10-CM | POA: Diagnosis not present

## 2021-08-24 DIAGNOSIS — R2689 Other abnormalities of gait and mobility: Secondary | ICD-10-CM

## 2021-08-24 DIAGNOSIS — M6281 Muscle weakness (generalized): Secondary | ICD-10-CM

## 2021-08-24 DIAGNOSIS — M25661 Stiffness of right knee, not elsewhere classified: Secondary | ICD-10-CM

## 2021-08-24 DIAGNOSIS — R600 Localized edema: Secondary | ICD-10-CM

## 2021-08-24 NOTE — Therapy (Signed)
[facility] [address]  [place], [place], [address] Phone: [phone]   Fax:  [phone]  Physical Therapy Treatment  Patient Details  Name: Miranda [name] MRN: [id number] Date of Birth: Jun 26, 1976 Referring Provider (PT): Marcene Duos   Encounter Date: 08/24/2021   PT End of Session - 08/24/21 1539     Visit Number 9    Number of Visits 30    Date for PT Re-Evaluation 09/27/21    Authorization Type BCBS    Authorization - Number of Visits 60    Progress Note Due on Visit 10    PT Start Time 1449    PT Stop Time 1550    PT Time Calculation (min) 61 min    Activity Tolerance Patient tolerated treatment well;Patient limited by pain    Behavior During Therapy [facility] for tasks assessed/performed             Past Medical History:  Diagnosis Date   Anginal pain ([place])    Anxiety    Arthritis    Asthma    Bowel obstruction ([place])    Cardiac arrhythmia    Depression    sees Dr. Matilde Haymaker in [place]    Diabetes mellitus    sees Dr. Madelin Rear    Dyspnea    Dysrhythmia    GERD (gastroesophageal reflux disease)    Hyperlipidemia    Hypertension    Hypothyroidism    sees Dr. Elyse Hsu   Insomnia    Migraine syndrome    sees Dr. Teodora Medici at [facility]    Morbid obesity [facility])    Multiple personality ([place])    Neck pain    Neuropathy associated with endocrine disorder ([place])    Sleep apnea with use of continuous positive airway pressure (CPAP)    Sleep apnea is resolved due to weight loss. 08/2017   Stroke ([place]) 03/25/2016   left MCA    Stroke [facility]) 04/2016   se[facility] Dr. Teodora Medici at [facility]     Past Surgical History:  Procedure Laterality Date   blocked itestinal repair  1975   age 45 years   BREAST BIOPSY Left 2017   CHOLECYSTECTOMY  2011   INDUCED ABORTION  1996   forced abortion   INGUINAL HERNIA REPAIR Left 1980   age 45   KNEE CLOSED REDUCTION Right 08/22/2021    Procedure: RIGHT  KNEE MANIPULATION UNDER ANESTHESIA;  Surgeon: Meredith Pel, MD;  Location: [place];  Service: Orthopedics;  Laterality: Right;   LAPAROSCOPIC ENDOMETRIOSIS FULGURATION  1998   TOTAL KNEE ARTHROPLASTY Right 07/05/2021   Procedure: TOTAL KNEE ARTHROPLASTY;  Surgeon: Meredith Pel, MD;  Location: [place];  Service: Orthopedics;  Laterality: Right;   WISDOM TOOTH EXTRACTION  2007    There were no vitals filed for this visit.   Subjective Assessment - 08/24/21 1456     Subjective Pt reports that she is in a lot of pain today, has trouble getting in and out car today, limited CPM use today due to pain.    Pertinent History depression, PTSD,migraine/ TIA (loss of sensation on L side), L ischemic stroke, T2DM, irregular heartbeat, gastroparesis, IBS, asthma, GERD    Diagnostic tests X-ray    Patient Stated Goals normal function on knee, get it strainght and get it bent    Currently in Pain? Yes    Pain Score 7     Pain Location Knee  Pain Orientation Right    Pain Descriptors / Indicators Aching;Tightness    Pain Type Acute pain;Surgical pain                               [facility] Adult PT Treatment/Exercise - 08/24/21 0001       Exercises   Exercises Knee/Hip      Knee/Hip Exercises: Stretches   Hip Flexor Stretch Right;4 reps;10 seconds    Hip Flexor Stretch Limitations in prone      Knee/Hip Exercises: Aerobic   Stationary Bike partial revolutions x 6 min      Knee/Hip Exercises: Supine   Heel Slides AAROM;Right;10 reps;3 sets    Heel Slides Limitations with strap and feet on peanut    Straight Leg Raises Strengthening;Right;10 reps      Knee/Hip Exercises: Prone   Contract/Relax to Increase Flexion 3x5" holds      Modalities   Modalities Vasopneumatic      Vasopneumatic   Number Minutes Vasopneumatic  15 minutes    Vasopnuematic Location  Knee    Vasopneumatic Pressure Low    Vasopneumatic Temperature  34       Manual Therapy   Manual Therapy Joint mobilization;Passive ROM    Joint Mobilization PA mobs tibia on femur grade III in supine, patellar mobs, distraction/glide/and IR in sitting into flexion    Passive ROM knee flexion stretches in supine and prone with hip extended; contract relax knee flexion 3x5" holds                       PT Short Term Goals - 08/11/21 1518       PT SHORT TERM GOAL #1   Title Ind with initial HEP    Time 2    Period Weeks    Status Achieved    Target Date 08/16/21               PT Long Term Goals - 08/23/21 1153       PT LONG TERM GOAL #1   Title Ind with progressed HEP for LE strengthening/gait    Time 8    Period Weeks    Status On-going   met for current   Target Date 09/27/21      PT LONG TERM GOAL #2   Title Pt. will improve R knee extension to no more than lacking 5 deg full extension for safety with gait.    Baseline lacking 20 deg R knee extension.    Time 8    Period Weeks    Status On-going   10/13- lacking 7 degrees 10/25- lacking 10 deg   Target Date 09/27/21      PT LONG TERM GOAL #3   Title Pt. will demonstrate 115 deg R knee flexion to be able to perform activities safely    Baseline 80 deg R knee flexion    Time 8    Period Weeks    Status On-going   10/13- 90 deg 10/25- 96 deg   Target Date 09/27/21      PT LONG TERM GOAL #4   Title Pt. will be able to ambulate 1000' without AD safely without limitations from pain to access community.    Baseline 2WRW, slow antalgic gait    Time 8    Period Weeks    Status On-going   10/13- using [facility]   Target Date 09/27/21  PT LONG TERM GOAL #5   Title Patient will demonstrate improved functional LE strength by completing 5xSTS < 14 seconds.    Baseline 24 seconds.    Time 8    Period Weeks    Status On-going    Target Date 09/27/21      PT LONG TERM GOAL #6   Title Pt. will report no more than 2/10 R knee pain with activity.    Baseline 5-8/10  depending on activity    Time 8    Period Weeks    Status On-going   10/25- 5/10 with bike   Target Date 09/27/21                   Plan - 08/24/21 1539     Clinical Impression Statement Pt today reportedly was in more pain than yesterday. Focused session on increasing knee flexion with interventions. She was mostly limited by R knee pain with flexion, limited tolerance shown today for PROM due to hip flexor tightness and lateral knee pain. Post session knee flexion was still at 96 deg mostly due to pain, extension decreased to 9 deg. Ended session with GR to address soreness and swelling.    Personal Factors and Comorbidities Comorbidity 3+    Comorbidities depression, PTSD, obesity, TIA/migraine, T2DM, hypertension, HT, gastroparesis, IBS, asthma, GERD, L knee OA, history ischemic stroke, heart irregularities    PT Frequency 5x / week   5x/week for 1-2 weeks, decreasing to 2-3x/week as appropriate   PT Duration 8 weeks    PT Treatment/Interventions ADLs/Self Care Home Management;Cryotherapy;Electrical Stimulation;Iontophoresis 40m/ml Dexamethasone;Moist Heat;Ultrasound;Gait training;Stair training;Functional mobility training;Therapeutic activities;Therapeutic exercise;Balance training;Neuromuscular re-education;Patient/family education;Manual techniques;Scar mobilization;Passive range of motion;Dry needling;Taping;Vasopneumatic Device;Joint Manipulations    PT Next Visit Plan progress R knee ROM and stregth as tolerated.    PT Home Exercise Plan Access Code: [email or web address]    [email or web address] Agree with Plan of Care Patient             Patient will benefit from skilled therapeutic intervention in order to improve the following deficits and impairments:  Abnormal gait, Decreased activity tolerance, Decreased endurance, Decreased range of motion, Decreased skin integrity, Decreased strength, Hypomobility, Increased fascial restricitons, Impaired sensation, Pain, Decreased mobility,  Decreased scar mobility, Difficulty walking, Increased edema, Increased muscle spasms, Impaired flexibility  Visit Diagnosis: Acute pain of right knee  Stiffness of right knee, not elsewhere classified  Other abnormalities of gait and mobility  Muscle weakness (generalized)  Localized edema     Problem List Patient Active Problem List   Diagnosis Date Noted   Arthritis of right knee    Diabetic gastroparesis ([place] 07/20/2021   Constipation 07/20/2021   S/P total knee arthroplasty, right 07/05/2021   Chronic diarrhea 04/29/2021   Belching 04/29/2021   Primary osteoarthritis of both knees 10/28/2020   Irritable bowel syndrome with diarrhea 10/16/2019   Tendinitis of right rotator cuff 09/17/2019   Hand pain, right 05/21/2019   Labral tear of hip joint 05/21/2019   Type 2 diabetes mellitus with diabetic neuropathy, unspecified ([place] 10/01/2018   Right hip pain 05/22/2018   DTennis MustQuervain's disease (radial styloid tenosynovitis) 04/02/2018   Carpal tunnel syndrome, right upper limb 12/31/2017   Elbow injury, right, initial encounter 12/16/2017   Chronic pain of both shoulders 11/30/2017   PTSD (post-traumatic stress disorder) 10/25/2016   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[place] 10/25/2016   Diabetes mellitus ([place] 10/25/2016   Major depressive disorder, recurrent episode, severe, with psychosis ([place] 10/20/2016   Major  depressive disorder, recurrent episode, severe, 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[place]) 10/19/2016   TIA (transient ischemic attack) 05/16/2016   Arterial ischemic stroke, MCA, left, acute ([place]) 03/29/2016   Plantar fasciitis 08/08/2013   Obesity, Class III, BMI 40-49.9 (morbid obesity) ([place]) 04/09/2013   Unspecified sleep apnea 03/31/2013   PCO (polycystic ovaries) 04/11/2012   Neck pain 11/21/2010   FATTY LIVER DISEASE 01/26/2010   NAUSEA WITH VOMITING 01/26/2010   PORTAL HYPERTENSION 01/26/2010   Migraine headache 06/28/2009   BREAST MASS 08/21/2008   KNEE PAIN  04/14/2008   Hypothyroidism 03/17/2008   Hyperlipidemia 03/17/2008   DEPRESSION 03/17/2008   NEUROPATHY 03/17/2008   Essential hypertension 03/17/2008   ASTHMA 03/17/2008   GERD 03/17/2008   Headache 03/17/2008    Artist Pais, PTA 08/24/2021, 4:10 PM  [facility] [address]  [place], [place], [address] Phone: [phone]   Fax:  [phone]  Name: SHAKEYLA GIEBLER MRN: [id number] Date of Birth: 1976/03/07

## 2021-08-25 ENCOUNTER — Ambulatory Visit: Payer: BC Managed Care – PPO

## 2021-08-25 ENCOUNTER — Ambulatory Visit: Payer: BC Managed Care – PPO | Admitting: Gastroenterology

## 2021-08-25 DIAGNOSIS — M6281 Muscle weakness (generalized): Secondary | ICD-10-CM

## 2021-08-25 DIAGNOSIS — R2689 Other abnormalities of gait and mobility: Secondary | ICD-10-CM

## 2021-08-25 DIAGNOSIS — M25661 Stiffness of right knee, not elsewhere classified: Secondary | ICD-10-CM

## 2021-08-25 DIAGNOSIS — R600 Localized edema: Secondary | ICD-10-CM

## 2021-08-25 DIAGNOSIS — M25561 Pain in right knee: Secondary | ICD-10-CM | POA: Diagnosis not present

## 2021-08-25 NOTE — Therapy (Signed)
[facility] [address]  [place], [place], [address] Phone: [phone]   Fax:  [phone]  Physical Therapy Treatment  Patient Details  Name: Miranda [name] MRN: [id number] Date of Birth: [birth year] Referring Provider (PT): Marcene Duos   Encounter Date: 08/25/2021   PT End of Session - 08/25/21 1623     Visit Number 10    Number of Visits 30    Date for PT Re-Evaluation 09/27/21    Authorization Type BCBS    Authorization - Number of Visits 60    Progress Note Due on Visit 10    PT Start Time 1531    PT Stop Time 1627    PT Time Calculation (min) 56 min    Activity Tolerance Patient tolerated treatment well;Patient limited by pain    Behavior During Therapy [facility] for tasks assessed/performed             Past Medical History:  Diagnosis Date   Anginal pain ([place])    Anxiety    Arthritis    Asthma    Bowel obstruction ([place])    Cardiac arrhythmia    Depression    sees Dr. Matilde Haymaker in [place]    Diabetes mellitus    sees Dr. Madelin Rear    Dyspnea    Dysrhythmia    GERD (gastroesophageal reflux disease)    Hyperlipidemia    Hypertension    Hypothyroidism    sees Dr. Elyse Hsu   Insomnia    Migraine syndrome    se[facility] at [facility]    Morbid obesity [facility])    Multiple personality ([place])    Neck pain    Neuropathy associated with endocrine disorder ([place])    Sleep apnea with use of continuous positive airway pressure (CPAP)    Sleep apnea is resolved due to weight loss. 08/2017   Stroke ([place]) 03/25/2016   left MCA    Stroke [facility]) 04/2016   se[facility] Teod[facility] Medici at [facility]     Past Surgical History:  Procedure Laterality Date   blocked itestinal repair  1975   age 22 months   BREAST BIOPSY Left 2017   CHOLECYSTECTOMY  2011   INDUCED ABORTION  1996   forced abortion   INGUINAL HERNIA REPAIR Left 1980   age 16   KNEE CLOSED REDUCTION Right 08/22/2021    Procedure: RIGHT  KNEE MANIPULATION UNDER ANESTHESIA;  Surgeon: Meredith Pel, MD;  Location: [place];  Service: Orthopedics;  Laterality: Right;   LAPAROSCOPIC ENDOMETRIOSIS FULGURATION  1998   TOTAL KNEE ARTHROPLASTY Right 07/05/2021   Procedure: TOTAL KNEE ARTHROPLASTY;  Surgeon: Meredith Pel, MD;  Location: [place];  Service: Orthopedics;  Laterality: Right;   WISDOM TOOTH EXTRACTION  2007    There were no vitals filed for this visit.   Subjective Assessment - 08/25/21 1535     Subjective Pt reports some soreness and numbness along her lateral knee.    Pertinent History depression, PTSD,migraine/ TIA (loss of sensation on L side), L ischemic stroke, T2DM, irregular heartbeat, gastroparesis, IBS, asthma, GERD    Diagnostic tests X-ray    Patient Stated Goals normal function on knee, get it strainght and get it bent    Currently in Pain? Yes    Pain Score 7     Pain Location Knee    Pain Orientation Right    Pain Descriptors / Indicators Aching;Tightness  Pain Type Acute pain;Surgical pain                [facility] PT Assessment - 08/25/21 0001       AROM   Right Knee Flexion 100   on recumbent bike                          [facility] Adult PT Treatment/Exercise - 08/25/21 0001       Exercises   Exercises Knee/Hip      Knee/Hip Exercises: Stretches   Hip Flexor Stretch Right;2 reps;30 seconds    Hip Flexor Stretch Limitations thomas stretch and in prone 2 reps each      Knee/Hip Exercises: Aerobic   Stationary Bike partial revolutions x 6 min   able to complete 5 full rev     Knee/Hip Exercises: Sidelying   Other Sidelying Knee/Hip Exercises R RF stretch in Ober position 2x30"; manual stretch      Knee/Hip Exercises: Prone   Hip Extension Strengthening;Right;10 reps    Contract/Relax to Increase Flexion 5x5" holds      Modalities   Modalities Vasopneumatic      Vasopneumatic   Number Minutes Vasopneumatic  10 minutes     Vasopnuematic Location  Knee    Vasopneumatic Pressure Low    Vasopneumatic Temperature  34      Manual Therapy   Manual Therapy Soft tissue mobilization;Passive ROM    Soft tissue mobilization IASTM with stainless steel tools to rectus femoris, lat quads, distal knee    Passive ROM knee flexion stretches in supine and prone with hip extended                       PT Short Term Goals - 08/11/21 1518       PT SHORT TERM GOAL #1   Title Ind with initial HEP    Time 2    Period Weeks    Status Achieved    Target Date 08/16/21               PT Long Term Goals - 08/23/21 1153       PT LONG TERM GOAL #1   Title Ind with progressed HEP for LE strengthening/gait    Time 8    Period Weeks    Status On-going   met for current   Target Date 09/27/21      PT LONG TERM GOAL #2   Title Pt. will improve R knee extension to no more than lacking 5 deg full extension for safety with gait.    Baseline lacking 20 deg R knee extension.    Time 8    Period Weeks    Status On-going   10/13- lacking 7 degrees 10/25- lacking 10 deg   Target Date 09/27/21      PT LONG TERM GOAL #3   Title Pt. will demonstrate 115 deg R knee flexion to be able to perform activities safely    Baseline 80 deg R knee flexion    Time 8    Period Weeks    Status On-going   10/13- 90 deg 10/25- 96 deg   Target Date 09/27/21      PT LONG TERM GOAL #4   Title Pt. will be able to ambulate 1000' without AD safely without limitations from pain to access community.    Baseline 2WRW, slow antalgic gait    Time 8    Period  Weeks    Status On-going   10/13- using [facility]   Target Date 09/27/21      PT LONG TERM GOAL #5   Title Patient will demonstrate improved functional LE strength by completing 5xSTS < 14 seconds.    Baseline 24 seconds.    Time 8    Period Weeks    Status On-going    Target Date 09/27/21      PT LONG TERM GOAL #6   Title Pt. will report no more than 2/10 R knee pain with  activity.    Baseline 5-8/10 depending on activity    Time 8    Period Weeks    Status On-going   10/25- 5/10 with bike   Target Date 09/27/21                   Plan - 08/25/21 1637     Clinical Impression Statement Focused session today on manual techniques to increase knee flexion. Tightness found in rectus femoris and lateral quads. Followed MT with stretches for the anterior hip, these were well tolerated. She was able to complete 5 full revolutions on the recumbent bike after the manual work done. Knee flexion was measured 100 deg today after manual as well. Although interventions were focused on achieving flexion today she would need manual work to increase extension as well based on previous measurements. She is progressing well post op from [facility]. Ended session with GR to address soreness and sweling post session.    Personal Factors and Comorbidities Comorbidity 3+    Comorbidities depression, PTSD, obesity, TIA/migraine, T2DM, hypertension, HT, gastroparesis, IBS, asthma, GERD, L knee OA, history ischemic stroke, heart irregularities    PT Frequency 5x / week   5x/week for 1-2 weeks, decreasing to 2-3x/week as appropriate   PT Duration 8 weeks    PT Treatment/Interventions ADLs/Self Care Home Management;Cryotherapy;Electrical Stimulation;Iontophoresis 10m/ml Dexamethasone;Moist Heat;Ultrasound;Gait training;Stair training;Functional mobility training;Therapeutic activities;Therapeutic exercise;Balance training;Neuromuscular re-education;Patient/family education;Manual techniques;Scar mobilization;Passive range of motion;Dry needling;Taping;Vasopneumatic Device;Joint Manipulations    PT Next Visit Plan STM to RF, lat quads, and post knee; progress R knee ROM and stregth as tolerated.    PT Home Exercise Plan Access Code: [email or web address]    [email or web address] Agree with Plan of Care Patient             Patient will benefit from skilled therapeutic intervention in order to improve the  following deficits and impairments:  Abnormal gait, Decreased activity tolerance, Decreased endurance, Decreased range of motion, Decreased skin integrity, Decreased strength, Hypomobility, Increased fascial restricitons, Impaired sensation, Pain, Decreased mobility, Decreased scar mobility, Difficulty walking, Increased edema, Increased muscle spasms, Impaired flexibility  Visit Diagnosis: Acute pain of right knee  Stiffness of right knee, not elsewhere classified  Other abnormalities of gait and mobility  Muscle weakness (generalized)  Localized edema     Problem List Patient Active Problem List   Diagnosis Date Noted   Arthritis of right knee    Diabetic gastroparesis ([place] 07/20/2021   Constipation 07/20/2021   S/P total knee arthroplasty, right 07/05/2021   Chronic diarrhea 04/29/2021   Belching 04/29/2021   Primary osteoarthritis of both knees 10/28/2020   Irritable bowel syndrome with diarrhea 10/16/2019   Tendinitis of right rotator cuff 09/17/2019   Hand pain, right 05/21/2019   Labral tear of hip joint 05/21/2019   Type 2 diabetes mellitus with diabetic neuropathy, unspecified ([place] 10/01/2018   Right hip pain 05/22/2018   DTennis MustQuervain's disease (radial styloid tenosynovitis) 04/02/2018  Carpal tunnel syndrome, right upper limb 12/31/2017   Elbow injury, right, initial encounter 12/16/2017   Chronic pain of both shoulders 11/30/2017   PTSD (post-traumatic stress disorder) 10/25/2016   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[place]) 10/25/2016   Diabetes mellitus ([place]) 10/25/2016   Major depressive disorder, recurrent episode, severe, with psychosis ([place]) 10/20/2016   Major depressive disorder, recurrent episode, severe, 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[place]) 10/19/2016   TIA (transient ischemic attack) 05/16/2016   Arterial ischemic stroke, MCA, left, acute ([place]) 03/29/2016   Plantar fasciitis 08/08/2013   Obesity, Class III, BMI 40-49.9 (morbid obesity) ([place]) 04/09/2013    Unspecified sleep apnea 03/31/2013   PCO (polycystic ovaries) 04/11/2012   Neck pain 11/21/2010   FATTY LIVER DISEASE 01/26/2010   NAUSEA WITH VOMITING 01/26/2010   PORTAL HYPERTENSION 01/26/2010   Migraine headache 06/28/2009   BREAST MASS 08/21/2008   KNEE PAIN 04/14/2008   Hypothyroidism 03/17/2008   Hyperlipidemia 03/17/2008   DEPRESSION 03/17/2008   NEUROPATHY 03/17/2008   Essential hypertension 03/17/2008   ASTHMA 03/17/2008   GERD 03/17/2008   Headache 03/17/2008    Artist Pais, PTA 08/25/2021, 5:12 PM  [facility] [address]  [place], [place], [address] Phone: [phone]   Fax:  [phone]  Name: Miranda [name] MRN: [id number] Date of Birth: 1976/05/28

## 2021-08-26 ENCOUNTER — Encounter: Payer: Self-pay | Admitting: Physical Therapy

## 2021-08-26 ENCOUNTER — Other Ambulatory Visit: Payer: Self-pay

## 2021-08-26 ENCOUNTER — Ambulatory Visit: Payer: BC Managed Care – PPO | Admitting: Physical Therapy

## 2021-08-26 DIAGNOSIS — M25561 Pain in right knee: Secondary | ICD-10-CM | POA: Diagnosis not present

## 2021-08-26 DIAGNOSIS — R2689 Other abnormalities of gait and mobility: Secondary | ICD-10-CM

## 2021-08-26 DIAGNOSIS — R600 Localized edema: Secondary | ICD-10-CM

## 2021-08-26 DIAGNOSIS — M6281 Muscle weakness (generalized): Secondary | ICD-10-CM

## 2021-08-26 DIAGNOSIS — M25661 Stiffness of right knee, not elsewhere classified: Secondary | ICD-10-CM

## 2021-08-26 NOTE — Therapy (Signed)
[facility] [address]  [place], [place], [address] Phone: [phone]   Fax:  [phone]  Physical Therapy Treatment  Patient Details  Name: Miranda [name] MRN: [id number] Date of Birth: 03/03/1976 Referring Provider (PT): Marcene Duos   Encounter Date: 08/26/2021   PT End of Session - 08/26/21 1044     Visit Number 11    Number of Visits 30    Date for PT Re-Evaluation 09/27/21    Authorization Type BCBS    Authorization - Number of Visits 60    Progress Note Due on Visit 10    PT Start Time 1040    PT Stop Time 1155    PT Time Calculation (min) 75 min    Activity Tolerance Patient tolerated treatment well;Patient limited by pain    Behavior During Therapy [facility] for tasks assessed/performed             Past Medical History:  Diagnosis Date   Anginal pain ([place])    Anxiety    Arthritis    Asthma    Bowel obstruction ([place])    Cardiac arrhythmia    Depression    sees Dr. Matilde Haymaker in [place]    Diabetes mellitus    sees Dr. Madelin Rear    Dyspnea    Dysrhythmia    GERD (gastroesophageal reflux disease)    Hyperlipidemia    Hypertension    Hypothyroidism    sees Dr. Elyse Hsu   Insomnia    Migraine syndrome    sees Dr. 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 08/2017   Stroke ([place]) 03/25/2016   left MCA    Stroke [facility]) 04/2016   sees Dr. Teodora Medici at [facility]     Past Surgical History:  Procedure Laterality Date   blocked itestinal repair  1975   age 22 months   BREAST BIOPSY Left 2017   CHOLECYSTECTOMY  2011   INDUCED ABORTION  1996   forced abortion   INGUINAL HERNIA REPAIR Left 1980   age 66   KNEE CLOSED REDUCTION Right 08/22/2021    Procedure: RIGHT  KNEE MANIPULATION UNDER ANESTHESIA;  Surgeon: Meredith Pel, MD;  Location: [place];  Service: Orthopedics;  Laterality: Right;   LAPAROSCOPIC ENDOMETRIOSIS FULGURATION  1998   TOTAL KNEE ARTHROPLASTY Right 07/05/2021   Procedure: TOTAL KNEE ARTHROPLASTY;  Surgeon: Meredith Pel, MD;  Location: [place];  Service: Orthopedics;  Laterality: Right;   WISDOM TOOTH EXTRACTION  2007    There were no vitals filed for this visit.   Subjective Assessment - 08/26/21 1042     Subjective Patient reports getting CPM machine up to 112 degrees last night.  Pain is better and eaiser to stretch out knee.  Did CPM this morning.    Pertinent History depression, PTSD,migraine/ TIA (loss of sensation on L side), L ischemic stroke, T2DM, irregular heartbeat, gastroparesis, IBS, asthma, GERD    Diagnostic tests X-ray    Patient Stated Goals normal function on knee, get it strainght and get it bent  [facility] Adult PT Treatment/Exercise - 08/26/21 0001       Exercises   Exercises Knee/Hip      Knee/Hip Exercises: Stretches   Passive Hamstring Stretch Right;3 reps;20 seconds    Quad Stretch Right;5 reps;30 seconds    Hip Flexor Stretch Right;2 reps;30 seconds    Hip Flexor Stretch Limitations --   prone     Knee/Hip Exercises: Aerobic   Stationary Bike partial revolutions x 6 min      Knee/Hip Exercises: Standing   Knee Flexion AROM;10 reps    Knee Flexion Limitations on stool    Other Standing Knee Exercises runners stretch for R knee extension      Knee/Hip Exercises: Prone   Hip Extension Strengthening;Right;10 reps    Contract/Relax to Increase Flexion 6 x 6' holds in prone with hip extended    Prone Knee Hang 1 minute      Modalities   Modalities Vasopneumatic      Vasopneumatic   Number Minutes Vasopneumatic  10 minutes    Vasopnuematic Location  Knee    Vasopneumatic Pressure Low    Vasopneumatic  Temperature  34      Manual Therapy   Manual Therapy Soft tissue mobilization;Passive ROM    Soft tissue mobilization IASTM with stainless steel tool to R quad in sitting, R hamstring in prone    Passive ROM Knee flexion in sitting with traction, IR and glide x10, in supine PA tib/femoral mobs                       PT Short Term Goals - 08/11/21 1518       PT SHORT TERM GOAL #1   Title Ind with initial HEP    Time 2    Period Weeks    Status Achieved    Target Date 08/16/21               PT Long Term Goals - 08/23/21 1153       PT LONG TERM GOAL #1   Title Ind with progressed HEP for LE strengthening/gait    Time 8    Period Weeks    Status On-going   met for current   Target Date 09/27/21      PT LONG TERM GOAL #2   Title Pt. will improve R knee extension to no more than lacking 5 deg full extension for safety with gait.    Baseline lacking 20 deg R knee extension.    Time 8    Period Weeks    Status On-going   10/13- lacking 7 degrees 10/25- lacking 10 deg   Target Date 09/27/21      PT LONG TERM GOAL #3   Title Pt. will demonstrate 115 deg R knee flexion to be able to perform activities safely    Baseline 80 deg R knee flexion    Time 8    Period Weeks    Status On-going   10/13- 90 deg 10/25- 96 deg   Target Date 09/27/21      PT LONG TERM GOAL #4   Title Pt. will be able to ambulate 1000' without AD safely without limitations from pain to access community.    Baseline 2WRW, slow antalgic gait    Time 8    Period Weeks    Status On-going   10/13- using [facility]   Target Date 09/27/21      PT LONG TERM GOAL #5   Title Patient  will demonstrate improved functional LE strength by completing 5xSTS < 14 seconds.    Baseline 24 seconds.    Time 8    Period Weeks    Status On-going    Target Date 09/27/21      PT LONG TERM GOAL #6   Title Pt. will report no more than 2/10 R knee pain with activity.    Baseline 5-8/10 depending on activity     Time 8    Period Weeks    Status On-going   10/25- 5/10 with bike   Target Date 09/27/21                   Plan - 08/26/21 1155     Clinical Impression Statement Focused session today on continuing to progress R knee ROM, including manual therapy to hamstrings and prone knee extension stretch with foot off bed, was able to tolerate for short period, encourage to start exercise at home, also knee stretch on step with foot in slight IR which was tolerated well.  Continues to lack about 10 deg of extension, but maintaining 100 deg flexion at end of session.  GR end of session to address soreness and swelling post session, patient would benefit from continued skilled therapy.    Personal Factors and Comorbidities Comorbidity 3+    Comorbidities depression, PTSD, obesity, TIA/migraine, T2DM, hypertension, HT, gastroparesis, IBS, asthma, GERD, L knee OA, history ischemic stroke, heart irregularities    PT Frequency 5x / week   5x/week for 1-2 weeks, decreasing to 2-3x/week as appropriate   PT Duration 8 weeks    PT Treatment/Interventions ADLs/Self Care Home Management;Cryotherapy;Electrical Stimulation;Iontophoresis 66m/ml Dexamethasone;Moist Heat;Ultrasound;Gait training;Stair training;Functional mobility training;Therapeutic activities;Therapeutic exercise;Balance training;Neuromuscular re-education;Patient/family education;Manual techniques;Scar mobilization;Passive range of motion;Dry needling;Taping;Vasopneumatic Device;Joint Manipulations    PT Next Visit Plan STM to RF, lat quads, and post knee; progress R knee ROM and stregth as tolerated.    PT Home Exercise Plan Access Code: [email or web address]    [email or web address] Agree with Plan of Care Patient             Patient will benefit from skilled therapeutic intervention in order to improve the following deficits and impairments:  Abnormal gait, Decreased activity tolerance, Decreased endurance, Decreased range of motion, Decreased skin integrity,  Decreased strength, Hypomobility, Increased fascial restricitons, Impaired sensation, Pain, Decreased mobility, Decreased scar mobility, Difficulty walking, Increased edema, Increased muscle spasms, Impaired flexibility  Visit Diagnosis: Acute pain of right knee  Stiffness of right knee, not elsewhere classified  Other abnormalities of gait and mobility  Muscle weakness (generalized)  Localized edema     Problem List Patient Active Problem List   Diagnosis Date Noted   Arthritis of right knee    Diabetic gastroparesis ([place] 07/20/2021   Constipation 07/20/2021   S/P total knee arthroplasty, right 07/05/2021   Chronic diarrhea 04/29/2021   Belching 04/29/2021   Primary osteoarthritis of both knees 10/28/2020   Irritable bowel syndrome with diarrhea 10/16/2019   Tendinitis of right rotator cuff 09/17/2019   Hand pain, right 05/21/2019   Labral tear of hip joint 05/21/2019   Type 2 diabetes mellitus with diabetic neuropathy, unspecified ([place] 10/01/2018   Right hip pain 05/22/2018   DTennis MustQuervain's disease (radial styloid tenosynovitis) 04/02/2018   Carpal tunnel syndrome, right upper limb 12/31/2017   Elbow injury, right, initial encounter 12/16/2017   Chronic pain of both shoulders 11/30/2017   PTSD (post-traumatic stress disorder) 10/25/2016   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[place] 10/25/2016   Diabetes  mellitus ([place]) 10/25/2016   Major depressive disorder, recurrent episode, severe, with psychosis ([place]) 10/20/2016   Major depressive disorder, recurrent episode, severe, 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[place]) 10/19/2016   TIA (transient ischemic attack) 05/16/2016   Arterial ischemic stroke, MCA, left, acute ([place]) 03/29/2016   Plantar fasciitis 08/08/2013   Obesity, Class III, BMI 40-49.9 (morbid obesity) ([place]) 04/09/2013   Unspecified sleep apnea 03/31/2013   PCO (polycystic ovaries) 04/11/2012   Neck pain 11/21/2010   FATTY LIVER DISEASE 01/26/2010   NAUSEA WITH VOMITING  01/26/2010   PORTAL HYPERTENSION 01/26/2010   Migraine headache 06/28/2009   BREAST MASS 08/21/2008   KNEE PAIN 04/14/2008   Hypothyroidism 03/17/2008   Hyperlipidemia 03/17/2008   DEPRESSION 03/17/2008   NEUROPATHY 03/17/2008   Essential hypertension 03/17/2008   ASTHMA 03/17/2008   GERD 03/17/2008   Headache 03/17/2008    Rennie Natter, PT, DPT  08/26/2021, 12:00 PM  [facility] [address]  [place], [place], [address] Phone: [phone]   Fax:  [phone]  Name: Miranda [name] MRN: [id number] Date of Birth: 08-Mar-1976

## 2021-08-27 DIAGNOSIS — M24661 Ankylosis, right knee: Secondary | ICD-10-CM

## 2021-08-29 ENCOUNTER — Encounter: Payer: Self-pay | Admitting: Physical Therapy

## 2021-08-29 ENCOUNTER — Ambulatory Visit: Payer: BC Managed Care – PPO | Admitting: Physical Therapy

## 2021-08-29 ENCOUNTER — Other Ambulatory Visit: Payer: Self-pay

## 2021-08-29 DIAGNOSIS — M25661 Stiffness of right knee, not elsewhere classified: Secondary | ICD-10-CM

## 2021-08-29 DIAGNOSIS — R2689 Other abnormalities of gait and mobility: Secondary | ICD-10-CM

## 2021-08-29 DIAGNOSIS — R600 Localized edema: Secondary | ICD-10-CM

## 2021-08-29 DIAGNOSIS — M25561 Pain in right knee: Secondary | ICD-10-CM | POA: Diagnosis not present

## 2021-08-29 DIAGNOSIS — M6281 Muscle weakness (generalized): Secondary | ICD-10-CM

## 2021-08-29 NOTE — Therapy (Signed)
[facility] [address]  [place], [place], [address] Phone: [phone]   Fax:  [phone]  Physical Therapy Treatment  Patient Details  Name: Miranda [name] MRN: [id number] Date of Birth: 13-Dec-1975 Referring Provider (PT): Marcene Duos   Encounter Date: 08/29/2021   PT End of Session - 08/29/21 1419     Visit Number 12    Number of Visits 30    Date for PT Re-Evaluation 09/27/21    Authorization Type BCBS    Authorization - Number of Visits 60    Progress Note Due on Visit 10    PT Start Time 0118    PT Stop Time 0215    PT Time Calculation (min) 57 min    Activity Tolerance Patient tolerated treatment well;Patient limited by pain    Behavior During Therapy [facility] for tasks assessed/performed             Past Medical History:  Diagnosis Date   Anginal pain ([place])    Anxiety    Arthritis    Asthma    Bowel obstruction ([place])    Cardiac arrhythmia    Depression    sees Dr. Matilde Haymaker in [place]    Diabetes mellitus    sees Dr. Madelin Rear    Dyspnea    Dysrhythmia    GERD (gastroesophageal reflux disease)    Hyperlipidemia    Hypertension    Hypothyroidism    sees Dr. Elyse Hsu   Insomnia    Migraine syndrome    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 08/2017   Stroke ([place]) 03/25/2016   left MCA    Stroke [facility]) 04/2016   se[facility] Teod[facility] Medici at [facility]     Past Surgical History:  Procedure Laterality Date   blocked itestinal repair  1975   age 81 months   BREAST BIOPSY Left 2017   CHOLECYSTECTOMY  2011   INDUCED ABORTION  1996   forced abortion   INGUINAL HERNIA REPAIR Left 1980   age 24   KNEE CLOSED REDUCTION Right 08/22/2021    Procedure: RIGHT  KNEE MANIPULATION UNDER ANESTHESIA;  Surgeon: Meredith Pel, MD;  Location: [place];  Service: Orthopedics;  Laterality: Right;   LAPAROSCOPIC ENDOMETRIOSIS FULGURATION  1998   TOTAL KNEE ARTHROPLASTY Right 07/05/2021   Procedure: TOTAL KNEE ARTHROPLASTY;  Surgeon: Meredith Pel, MD;  Location: [place];  Service: Orthopedics;  Laterality: Right;   WISDOM TOOTH EXTRACTION  2007    There were no vitals filed for this visit.   Subjective Assessment - 08/29/21 1324     Subjective Patient reports getting up to 115 on CPM machine this weekend, but did not use it this morning due to busy with work.  Stiff today.    Pertinent History depression, PTSD,migraine/ TIA (loss of sensation on L side), L ischemic stroke, T2DM, irregular heartbeat, gastroparesis, IBS, asthma, GERD    Diagnostic tests X-ray    Patient Stated Goals normal function on knee, get it strainght and get it bent    Currently in Pain? Yes    Pain Score 5     Pain Location Knee  Pain Orientation Right                [facility] PT Assessment - 08/29/21 0001       AROM   Right Knee Extension 8                           [facility] Adult PT Treatment/Exercise - 08/29/21 0001       Exercises   Exercises Knee/Hip      Knee/Hip Exercises: Aerobic   Stationary Bike seat 4 backwards, able to progress to full revolutions foward x 10 min   encouragement throughout to continue and increase speed.     Knee/Hip Exercises: Standing   Knee Flexion AROM;10 reps    Knee Flexion Limitations on step    Forward Step Up Both;2 sets;10 reps;Hand Hold: 2;Step Height: 4"    Other Standing Knee Exercises runners stretch for R knee extension      Knee/Hip Exercises: Prone   Hip Extension Strengthening;Right;5 reps    Hip Extension Limitations decreased tolerance today    Prone Knee Hang 1 minute      Modalities   Modalities Vasopneumatic      Vasopneumatic   Number Minutes  Vasopneumatic  10 minutes    Vasopnuematic Location  Knee    Vasopneumatic Pressure Low    Vasopneumatic Temperature  34      Manual Therapy   Manual Therapy Soft tissue mobilization;Passive ROM    Joint Mobilization patellar mobs, ER and AP mobs of femur on tibia to improve extension    Soft tissue mobilization IASTM with stainless steel tool to R quad in sitting, R hamstring in prone    Passive ROM R knee flexion in prone, R knee extension in supine                       PT Short Term Goals - 08/11/21 1518       PT SHORT TERM GOAL #1   Title Ind with initial HEP    Time 2    Period Weeks    Status Achieved    Target Date 08/16/21               PT Long Term Goals - 08/23/21 1153       PT LONG TERM GOAL #1   Title Ind with progressed HEP for LE strengthening/gait    Time 8    Period Weeks    Status On-going   met for current   Target Date 09/27/21      PT LONG TERM GOAL #2   Title Pt. will improve R knee extension to no more than lacking 5 deg full extension for safety with gait.    Baseline lacking 20 deg R knee extension.    Time 8    Period Weeks    Status On-going   10/13- lacking 7 degrees 10/25- lacking 10 deg   Target Date 09/27/21      PT LONG TERM GOAL #3   Title Pt. will demonstrate 115 deg R knee flexion to be able to perform activities safely    Baseline 80 deg R knee flexion    Time 8    Period Weeks    Status On-going   10/13- 90 deg 10/25- 96 deg   Target Date 09/27/21      PT LONG TERM GOAL #4   Title Pt. will be able to ambulate 1000' without AD  safely without limitations from pain to access community.    Baseline 2WRW, slow antalgic gait    Time 8    Period Weeks    Status On-going   10/13- using [facility]   Target Date 09/27/21      PT LONG TERM GOAL #5   Title Patient will demonstrate improved functional LE strength by completing 5xSTS < 14 seconds.    Baseline 24 seconds.    Time 8    Period Weeks    Status On-going     Target Date 09/27/21      PT LONG TERM GOAL #6   Title Pt. will report no more than 2/10 R knee pain with activity.    Baseline 5-8/10 depending on activity    Time 8    Period Weeks    Status On-going   10/25- 5/10 with bike   Target Date 09/27/21                   Plan - 08/29/21 1420     Clinical Impression Statement Focus of today's skilled intervention was on progressing R knee extension.  She still reports a lot of pain with all movement of knee, and had difficulty tolerating even being placed into extension today without support under knee, and also difficulty tolerating gentle mobs to improve extension.  She did demonstrate slight improvement with extension at end of session, lacking 8 degrees.  Tried to place in extension with game ready but even with cold unable to tolerate and had to place wedge under legs.  She would benefit from continued skilled therapy.    Personal Factors and Comorbidities Comorbidity 3+    Comorbidities depression, PTSD, obesity, TIA/migraine, T2DM, hypertension, HT, gastroparesis, IBS, asthma, GERD, L knee OA, history ischemic stroke, heart irregularities    PT Frequency 5x / week   5x/week for 1-2 weeks, decreasing to 2-3x/week as appropriate   PT Duration 8 weeks    PT Treatment/Interventions ADLs/Self Care Home Management;Cryotherapy;Electrical Stimulation;Iontophoresis 92m/ml Dexamethasone;Moist Heat;Ultrasound;Gait training;Stair training;Functional mobility training;Therapeutic activities;Therapeutic exercise;Balance training;Neuromuscular re-education;Patient/family education;Manual techniques;Scar mobilization;Passive range of motion;Dry needling;Taping;Vasopneumatic Device;Joint Manipulations    PT Next Visit Plan STM to RF, lat quads, and post knee; progress R knee ROM and stregth as tolerated.    PT Home Exercise Plan Access Code: [email or web address]    [email or web address] Agree with Plan of Care Patient             Patient will benefit from skilled  therapeutic intervention in order to improve the following deficits and impairments:  Abnormal gait, Decreased activity tolerance, Decreased endurance, Decreased range of motion, Decreased skin integrity, Decreased strength, Hypomobility, Increased fascial restricitons, Impaired sensation, Pain, Decreased mobility, Decreased scar mobility, Difficulty walking, Increased edema, Increased muscle spasms, Impaired flexibility  Visit Diagnosis: Acute pain of right knee  Stiffness of right knee, not elsewhere classified  Other abnormalities of gait and mobility  Muscle weakness (generalized)  Localized edema     Problem List Patient Active Problem List   Diagnosis Date Noted   Arthrofibrosis of knee joint, right    Arthritis of right knee    Diabetic gastroparesis ([place] 07/20/2021   Constipation 07/20/2021   S/P total knee arthroplasty, right 07/05/2021   Chronic diarrhea 04/29/2021   Belching 04/29/2021   Primary osteoarthritis of both knees 10/28/2020   Irritable bowel syndrome with diarrhea 10/16/2019   Tendinitis of right rotator cuff 09/17/2019   Hand pain, right 05/21/2019   Labral tear of hip joint 05/21/2019  Type 2 diabetes mellitus with diabetic neuropathy, unspecified ([place]) 10/01/2018   Right hip pain 05/22/2018   Harriet Pho disease (radial styloid tenosynovitis) 04/02/2018   Carpal tunnel syndrome, right upper limb 12/31/2017   Elbow injury, right, initial encounter 12/16/2017   Chronic pain of both shoulders 11/30/2017   PTSD (post-traumatic stress disorder) 10/25/2016   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[place]) 10/25/2016   Diabetes mellitus ([place]) 10/25/2016   Major depressive disorder, recurrent episode, severe, with psychosis ([place]) 10/20/2016   Major depressive disorder, recurrent episode, severe, 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[place]) 10/19/2016   TIA (transient ischemic attack) 05/16/2016   Arterial ischemic stroke, MCA, left, acute ([place]) 03/29/2016   Plantar fasciitis  08/08/2013   Obesity, Class III, BMI 40-49.9 (morbid obesity) ([place]) 04/09/2013   Unspecified sleep apnea 03/31/2013   PCO (polycystic ovaries) 04/11/2012   Neck pain 11/21/2010   FATTY LIVER DISEASE 01/26/2010   NAUSEA WITH VOMITING 01/26/2010   PORTAL HYPERTENSION 01/26/2010   Migraine headache 06/28/2009   BREAST MASS 08/21/2008   KNEE PAIN 04/14/2008   Hypothyroidism 03/17/2008   Hyperlipidemia 03/17/2008   DEPRESSION 03/17/2008   NEUROPATHY 03/17/2008   Essential hypertension 03/17/2008   ASTHMA 03/17/2008   GERD 03/17/2008   Headache 03/17/2008    Rennie Natter, PT, DPT 08/29/2021, 2:30 PM  [facility] [address]  [place], [place], [address] Phone: [phone]   Fax:  [phone]  Name: KANDISE RIEHLE MRN: [id number] Date of Birth: 30-Sep-1976

## 2021-08-31 ENCOUNTER — Ambulatory Visit (INDEPENDENT_AMBULATORY_CARE_PROVIDER_SITE_OTHER): Payer: BC Managed Care – PPO | Admitting: Orthopedic Surgery

## 2021-08-31 ENCOUNTER — Encounter: Payer: Self-pay | Admitting: Physical Therapy

## 2021-08-31 ENCOUNTER — Ambulatory Visit: Payer: BC Managed Care – PPO | Attending: Orthopedic Surgery | Admitting: Physical Therapy

## 2021-08-31 ENCOUNTER — Other Ambulatory Visit: Payer: Self-pay

## 2021-08-31 DIAGNOSIS — R2689 Other abnormalities of gait and mobility: Secondary | ICD-10-CM | POA: Diagnosis present

## 2021-08-31 DIAGNOSIS — R600 Localized edema: Secondary | ICD-10-CM | POA: Diagnosis present

## 2021-08-31 DIAGNOSIS — M25561 Pain in right knee: Secondary | ICD-10-CM | POA: Insufficient documentation

## 2021-08-31 DIAGNOSIS — M25661 Stiffness of right knee, not elsewhere classified: Secondary | ICD-10-CM | POA: Diagnosis present

## 2021-08-31 DIAGNOSIS — M6281 Muscle weakness (generalized): Secondary | ICD-10-CM | POA: Diagnosis present

## 2021-08-31 DIAGNOSIS — M1711 Unilateral primary osteoarthritis, right knee: Secondary | ICD-10-CM

## 2021-08-31 NOTE — Therapy (Signed)
[facility] [address]  [place], [place], [address] Phone: [phone]   Fax:  [phone]  Physical Therapy Treatment  Patient Details  Name: Miranda [name] MRN: [id number] Date of Birth: [birth year] Referring Provider (PT): Marcene Duos   Encounter Date: 08/31/2021   PT End of Session - 08/31/21 0941     Visit Number 13    Number of Visits 30    Date for PT Re-Evaluation 09/27/21    Authorization Type BCBS    Authorization - Number of Visits 60    Progress Note Due on Visit 10    PT Start Time 0847    PT Stop Time 0945    PT Time Calculation (min) 58 min    Activity Tolerance Patient tolerated treatment well;Patient limited by pain    Behavior During Therapy [facility] for tasks assessed/performed             Past Medical History:  Diagnosis Date   Anginal pain ([place])    Anxiety    Arthritis    Asthma    Bowel obstruction ([place])    Cardiac arrhythmia    Depression    sees Dr. Matilde Haymaker in [place]    Diabetes mellitus    sees Dr. Madelin Rear    Dyspnea    Dysrhythmia    GERD (gastroesophageal reflux disease)    Hyperlipidemia    Hypertension    Hypothyroidism    sees Dr. Elyse Hsu   Insomnia    Migraine syndrome    sees Dr. 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 08/2017   Stroke ([place]) 03/25/2016   left MCA    Stroke [facility]) 04/2016   sees Dr. Teodora Medici at [facility]     Past Surgical History:  Procedure Laterality Date   blocked itestinal repair  1975   age 16 months   BREAST BIOPSY Left 2017   CHOLECYSTECTOMY  2011   INDUCED ABORTION  1996   forced abortion   INGUINAL HERNIA REPAIR Left 1980   age 89   KNEE CLOSED REDUCTION Right 08/22/2021    Procedure: RIGHT  KNEE MANIPULATION UNDER ANESTHESIA;  Surgeon: Meredith Pel, MD;  Location: [place];  Service: Orthopedics;  Laterality: Right;   LAPAROSCOPIC ENDOMETRIOSIS FULGURATION  1998   TOTAL KNEE ARTHROPLASTY Right 07/05/2021   Procedure: TOTAL KNEE ARTHROPLASTY;  Surgeon: Meredith Pel, MD;  Location: [place];  Service: Orthopedics;  Laterality: Right;   WISDOM TOOTH EXTRACTION  2007    There were no vitals filed for this visit.   Subjective Assessment - 08/31/21 0851     Subjective Patient reports feeling very stiff today, feels like Baker's cyst back of knee is keeping it from bending.    Pertinent History depression, PTSD,migraine/ TIA (loss of sensation on L side), L ischemic stroke, T2DM, irregular heartbeat, gastroparesis, IBS, asthma, GERD    Diagnostic tests X-ray    Patient Stated Goals normal function on knee, get it strainght and get it bent    Currently in Pain? Yes    Pain Score 6     Pain Location Knee    Pain Orientation Right  Pain Descriptors / Indicators Aching;Tightness                [facility] PT Assessment - 08/31/21 0001       Assessment   Medical Diagnosis Z98.890 (ICD-10-CM) - Post-operative state R TKA    Referring Provider (PT) Marcene Duos    Onset Date/Surgical Date 07/05/21    Hand Dominance Right    Next MD Visit 08/31/2021    Prior Therapy yes      Precautions   Precautions None      Restrictions   Weight Bearing Restrictions No      AROM   Right Knee Extension 14    Right Knee Flexion 100                           [facility] Adult PT Treatment/Exercise - 08/31/21 0001       Exercises   Exercises Knee/Hip      Knee/Hip Exercises: Aerobic   Stationary Bike seat 4 backwards, able to progress to full revolutions foward x 10 min      Knee/Hip Exercises: Standing   Knee Flexion AROM;10 reps    Knee Flexion Limitations on step    Other Standing Knee Exercises runners stretch for R  knee extension, progressed to sliding hands up wall to encourage quad contraction and knee extension      Knee/Hip Exercises: Supine   Knee Flexion AAROM;Right;10 reps    Knee Flexion Limitations feet on peanut ball with strap      Modalities   Modalities Vasopneumatic      Vasopneumatic   Number Minutes Vasopneumatic  10 minutes    Vasopnuematic Location  Knee    Vasopneumatic Pressure Low    Vasopneumatic Temperature  34      Manual Therapy   Manual Therapy Soft tissue mobilization;Passive ROM;Joint mobilization    Manual therapy comments to improve R knee flexion and extension    Joint Mobilization patellar mobs, ER and AP mobs of femur on tibia to improve extension, in sitting PA mob + traction and IR glide to improve flexion    Soft tissue mobilization IASTM with stainless steel tool to R quad in sitting, R hamstring in prone    Passive ROM R knee flexion with overpressure, R knee extension with overpressure                       PT Short Term Goals - 08/11/21 1518       PT SHORT TERM GOAL #1   Title Ind with initial HEP    Time 2    Period Weeks    Status Achieved    Target Date 08/16/21               PT Long Term Goals - 08/31/21 1044       PT LONG TERM GOAL #1   Title Ind with progressed HEP for LE strengthening/gait    Time 8    Period Weeks    Status On-going   met for current   Target Date 09/27/21      PT LONG TERM GOAL #2   Title Pt. will improve R knee extension to no more than lacking 5 deg full extension for safety with gait.    Baseline lacking 20 deg R knee extension.    Time 8    Period Weeks    Status On-going   10/13- lacking 7 degrees 10/25-  lacking 10 deg  11/2- lacking 14 deg   Target Date 09/27/21      PT LONG TERM GOAL #3   Title Pt. will demonstrate 115 deg R knee flexion to be able to perform activities safely    Baseline 80 deg R knee flexion    Time 8    Period Weeks    Status On-going   10/13- 90 deg 10/25- 96  deg 08/31/21- 100 deg   Target Date 09/27/21      PT LONG TERM GOAL #4   Title Pt. will be able to ambulate 1000' without AD safely without limitations from pain to access community.    Baseline 2WRW, slow antalgic gait    Time 8    Period Weeks    Status On-going   10/13- using [facility] 11/2- able to ambulate without AD but antalgic   Target Date 09/27/21      PT LONG TERM GOAL #5   Title Patient will demonstrate improved functional LE strength by completing 5xSTS < 14 seconds.    Baseline 24 seconds.    Time 8    Period Weeks    Status On-going      PT LONG TERM GOAL #6   Title Pt. will report no more than 2/10 R knee pain with activity.    Baseline 5-8/10 depending on activity    Time 8    Period Weeks    Status On-going   10/25- 5/10 with bike                  Plan - 08/31/21 1040     Clinical Impression Statement Miranda [name] continues to have difficulty with R knee stiffness and pain, despite being compliant with use of CPM at home, she also reports clicking in her knee when walking.  Today when arrived had less than 90 deg knee flexion, but by end of session following exercise and manual therapy was able to acheive 100 deg R knee flexion.  Her extension however, was still lacking 14 deg at end of session.  She sees her orthopedist today, she is to ask about Baker's cyst as she feels this is also limiting her knee flexion.  We also discussed coming prepared wearing shorts to therapy so that we could try Estim to quads during manual to improve tolerance to PROM and potentially trialing dry needling to upper quads and hamstrings.  She would benefit from continued skilled therapy.    Personal Factors and Comorbidities Comorbidity 3+    Comorbidities depression, PTSD, obesity, TIA/migraine, T2DM, hypertension, HT, gastroparesis, IBS, asthma, GERD, L knee OA, history ischemic stroke, heart irregularities    PT Frequency 5x / week   5x/week for 1-2 weeks, decreasing to 2-3x/week as  appropriate   PT Duration 8 weeks    PT Treatment/Interventions ADLs/Self Care Home Management;Cryotherapy;Electrical Stimulation;Iontophoresis 56m/ml Dexamethasone;Moist Heat;Ultrasound;Gait training;Stair training;Functional mobility training;Therapeutic activities;Therapeutic exercise;Balance training;Neuromuscular re-education;Patient/family education;Manual techniques;Scar mobilization;Passive range of motion;Dry needling;Taping;Vasopneumatic Device;Joint Manipulations    PT Next Visit Plan STM to RF, lat quads, and post knee; progress R knee ROM and stregth as tolerated.    PT Home Exercise Plan Access Code: [email or web address]    [email or web address] Agree with Plan of Care Patient             Patient will benefit from skilled therapeutic intervention in order to improve the following deficits and impairments:  Abnormal gait, Decreased activity tolerance, Decreased endurance, Decreased range of motion, Decreased skin integrity, Decreased strength, Hypomobility, Increased fascial  restricitons, Impaired sensation, Pain, Decreased mobility, Decreased scar mobility, Difficulty walking, Increased edema, Increased muscle spasms, Impaired flexibility  Visit Diagnosis: Acute pain of right knee  Stiffness of right knee, not elsewhere classified  Other abnormalities of gait and mobility  Muscle weakness (generalized)  Localized edema     Problem List Patient Active Problem List   Diagnosis Date Noted   Arthrofibrosis of knee joint, right    Arthritis of right knee    Diabetic gastroparesis ([place]) 07/20/2021   Constipation 07/20/2021   S/P total knee arthroplasty, right 07/05/2021   Chronic diarrhea 04/29/2021   Belching 04/29/2021   Primary osteoarthritis of both knees 10/28/2020   Irritable bowel syndrome with diarrhea 10/16/2019   Tendinitis of right rotator cuff 09/17/2019   Hand pain, right 05/21/2019   Labral tear of hip joint 05/21/2019   Type 2 diabetes mellitus with diabetic neuropathy,  unspecified ([place]) 10/01/2018   Right hip pain 05/22/2018   Tennis Must Quervain's disease (radial styloid tenosynovitis) 04/02/2018   Carpal tunnel syndrome, right upper limb 12/31/2017   Elbow injury, right, initial encounter 12/16/2017   Chronic pain of both shoulders 11/30/2017   PTSD (post-traumatic stress disorder) 10/25/2016   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[place]) 10/25/2016   Diabetes mellitus ([place]) 10/25/2016   Major depressive disorder, recurrent episode, severe, with psychosis ([place]) 10/20/2016   Major depressive disorder, recurrent episode, severe, 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[place]) 10/19/2016   TIA (transient ischemic attack) 05/16/2016   Arterial ischemic stroke, MCA, left, acute ([place]) 03/29/2016   Plantar fasciitis 08/08/2013   Obesity, Class III, BMI 40-49.9 (morbid obesity) ([place]) 04/09/2013   Unspecified sleep apnea 03/31/2013   PCO (polycystic ovaries) 04/11/2012   Neck pain 11/21/2010   FATTY LIVER DISEASE 01/26/2010   NAUSEA WITH VOMITING 01/26/2010   PORTAL HYPERTENSION 01/26/2010   Migraine headache 06/28/2009   BREAST MASS 08/21/2008   KNEE PAIN 04/14/2008   Hypothyroidism 03/17/2008   Hyperlipidemia 03/17/2008   DEPRESSION 03/17/2008   NEUROPATHY 03/17/2008   Essential hypertension 03/17/2008   ASTHMA 03/17/2008   GERD 03/17/2008   Headache 03/17/2008    Rennie Natter, PT, DPT 08/31/2021, 10:47 AM  [facility] [address]  [place], [place], [address] Phone: [phone]   Fax:  [phone]  Name: Miranda [name] MRN: [id number] Date of Birth: 30-Dec-1975

## 2021-09-01 ENCOUNTER — Ambulatory Visit: Payer: BC Managed Care – PPO

## 2021-09-01 DIAGNOSIS — M6281 Muscle weakness (generalized): Secondary | ICD-10-CM

## 2021-09-01 DIAGNOSIS — M25661 Stiffness of right knee, not elsewhere classified: Secondary | ICD-10-CM

## 2021-09-01 DIAGNOSIS — M25561 Pain in right knee: Secondary | ICD-10-CM | POA: Diagnosis not present

## 2021-09-01 DIAGNOSIS — R600 Localized edema: Secondary | ICD-10-CM

## 2021-09-01 DIAGNOSIS — R2689 Other abnormalities of gait and mobility: Secondary | ICD-10-CM

## 2021-09-01 NOTE — Therapy (Signed)
[facility] [address]  [place], [place], [address] Phone: [phone]   Fax:  [phone]  Physical Therapy Treatment  Patient Details  Name: Miranda [name] MRN: [id number] Date of Birth: 04-27-1976 Referring Provider (PT): Marcene Duos   Encounter Date: 09/01/2021   PT End of Session - 09/01/21 1403     Visit Number 14    Number of Visits 30    Date for PT Re-Evaluation 09/27/21    Authorization Type BCBS    Authorization - Number of Visits 60    Progress Note Due on Visit 10    PT Start Time 1315    PT Stop Time 1412    PT Time Calculation (min) 57 min    Activity Tolerance Patient tolerated treatment well;Patient limited by pain    Behavior During Therapy [facility] for tasks assessed/performed             Past Medical History:  Diagnosis Date   Anginal pain ([place])    Anxiety    Arthritis    Asthma    Bowel obstruction ([place])    Cardiac arrhythmia    Depression    sees Dr. Matilde Haymaker in [place]    Diabetes mellitus    sees Dr. Madelin Rear    Dyspnea    Dysrhythmia    GERD (gastroesophageal reflux disease)    Hyperlipidemia    Hypertension    Hypothyroidism    sees Dr. Elyse Hsu   Insomnia    Migraine syndrome    sees Dr. 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 08/2017   Stroke ([place]) 03/25/2016   left MCA    Stroke [facility]) 04/2016   sees Dr[facility] Teodora Medici at [facility]     Past Surgical History:  Procedure Laterality Date   blocked itestinal repair  1975   age 47 months   BREAST BIOPSY Left 2017   CHOLECYSTECTOMY  2011   INDUCED ABORTION  45   forced abortion   INGUINAL HERNIA REPAIR Left 1980   age 45   KNEE CLOSED REDUCTION Right 08/22/2021    Procedure: RIGHT  KNEE MANIPULATION UNDER ANESTHESIA;  Surgeon: Meredith Pel, MD;  Location: [place];  Service: Orthopedics;  Laterality: Right;   LAPAROSCOPIC ENDOMETRIOSIS FULGURATION  1998   TOTAL KNEE ARTHROPLASTY Right 07/05/2021   Procedure: TOTAL KNEE ARTHROPLASTY;  Surgeon: Meredith Pel, MD;  Location: [place];  Service: Orthopedics;  Laterality: Right;   WISDOM TOOTH EXTRACTION  2007    There were no vitals filed for this visit.   Subjective Assessment - 09/01/21 1318     Subjective Pt reports that she is still having a lot of pain, the doctor says the pain will continue for a while while trying to bend.    Pertinent History depression, PTSD,migraine/ TIA (loss of sensation on L side), L ischemic stroke, T2DM, irregular heartbeat, gastroparesis, IBS, asthma, GERD    Diagnostic tests X-ray    Patient Stated Goals normal function on knee, get it strainght and get it bent    Currently in Pain? Yes    Pain Score 6     Pain Location Knee  Pain Orientation Right    Pain Descriptors / Indicators Aching;Tightness    Pain Type Acute pain;Surgical pain                               [facility] Adult PT Treatment/Exercise - 09/01/21 0001       Exercises   Exercises Knee/Hip      Knee/Hip Exercises: Stretches   Passive Hamstring Stretch Right;2 reps;30 seconds    Passive Hamstring Stretch Limitations supine with strap    Hip Flexor Stretch Right;2 reps;30 seconds    Hip Flexor Stretch Limitations thomas stretch, 2nd set with strap to stretch quads    Knee: Self-Stretch to increase Flexion Right;5 reps;10 seconds    Knee: Self-Stretch Limitations fwd lunge into 8' step    Gastroc Stretch Right;2 reps;30 seconds    Gastroc Stretch Limitations long sitting with strap      Knee/Hip Exercises: Aerobic   Stationary Bike seat 4 full and partial rev 7 min      Modalities   Modalities Vasopneumatic      Vasopneumatic   Number Minutes  Vasopneumatic  10 minutes    Vasopnuematic Location  Knee    Vasopneumatic Pressure Low    Vasopneumatic Temperature  40      Manual Therapy   Manual Therapy Soft tissue mobilization    Soft tissue mobilization STM to R distal quads and HS; cross friction to inscision                      PT Short Term Goals - 08/11/21 1518       PT SHORT TERM GOAL #1   Title Ind with initial HEP    Time 2    Period Weeks    Status Achieved    Target Date 08/16/21               PT Long Term Goals - 08/31/21 1044       PT LONG TERM GOAL #1   Title Ind with progressed HEP for LE strengthening/gait    Time 8    Period Weeks    Status On-going   met for current   Target Date 09/27/21      PT LONG TERM GOAL #2   Title Pt. will improve R knee extension to no more than lacking 5 deg full extension for safety with gait.    Baseline lacking 20 deg R knee extension.    Time 8    Period Weeks    Status On-going   10/13- lacking 7 degrees 10/25- lacking 10 deg  11/2- lacking 14 deg   Target Date 09/27/21      PT LONG TERM GOAL #3   Title Pt. will demonstrate 115 deg R knee flexion to be able to perform activities safely    Baseline 80 deg R knee flexion    Time 8    Period Weeks    Status On-going   10/13- 90 deg 10/25- 96 deg 08/31/21- 100 deg   Target Date 09/27/21      PT LONG TERM GOAL #4   Title Pt. will be able to ambulate 1000' without AD safely without limitations from pain to access community.    Baseline 2WRW, slow antalgic gait    Time 8    Period Weeks    Status On-going   10/13- using [facility] 11/2- able to ambulate without AD but antalgic  Target Date 09/27/21      PT LONG TERM GOAL #5   Title Patient will demonstrate improved functional LE strength by completing 5xSTS < 14 seconds.    Baseline 24 seconds.    Time 8    Period Weeks    Status On-going      PT LONG TERM GOAL #6   Title Pt. will report no more than 2/10 R knee pain with activity.     Baseline 5-8/10 depending on activity    Time 8    Period Weeks    Status On-going   10/25- 5/10 with bike                  Plan - 09/01/21 1416     Clinical Impression Statement Pt reports stiffness continued with her R knee. She reported nothing new from MD visit other than to continue working on knee ROM. Interventions today focused on increasing R knee flexion and extension. Tenderness found in both the quad and HS musculature. She has a fair tolerance for manual stretches in both directions. After manual work she was able to do full rev on the bike easier but still stiff and cautiously. Ended session with GR to address swelling and soreness post session.    Personal Factors and Comorbidities Comorbidity 3+    Comorbidities depression, PTSD, obesity, TIA/migraine, T2DM, hypertension, HT, gastroparesis, IBS, asthma, GERD, L knee OA, history ischemic stroke, heart irregularities    PT Frequency Other (comment)   5x per week for 2 weeks, decrease to 1-2x per week as appropriate   PT Duration 8 weeks    PT Treatment/Interventions ADLs/Self Care Home Management;Cryotherapy;Electrical Stimulation;Iontophoresis 36m/ml Dexamethasone;Moist Heat;Ultrasound;Gait training;Stair training;Functional mobility training;Therapeutic activities;Therapeutic exercise;Balance training;Neuromuscular re-education;Patient/family education;Manual techniques;Scar mobilization;Passive range of motion;Dry needling;Taping;Vasopneumatic Device;Joint Manipulations    PT Next Visit Plan STM to RF, lat quads, and post knee; progress R knee ROM and stregth as tolerated.    PT Home Exercise Plan Access Code: [email or web address]    [email or web address] Agree with Plan of Care Patient             Patient will benefit from skilled therapeutic intervention in order to improve the following deficits and impairments:  Abnormal gait, Decreased activity tolerance, Decreased endurance, Decreased range of motion, Decreased skin integrity,  Decreased strength, Hypomobility, Increased fascial restricitons, Impaired sensation, Pain, Decreased mobility, Decreased scar mobility, Difficulty walking, Increased edema, Increased muscle spasms, Impaired flexibility  Visit Diagnosis: Acute pain of right knee  Stiffness of right knee, not elsewhere classified  Other abnormalities of gait and mobility  Muscle weakness (generalized)  Localized edema     Problem List Patient Active Problem List   Diagnosis Date Noted   Arthrofibrosis of knee joint, right    Arthritis of right knee    Diabetic gastroparesis ([place] 07/20/2021   Constipation 07/20/2021   S/P total knee arthroplasty, right 07/05/2021   Chronic diarrhea 04/29/2021   Belching 04/29/2021   Primary osteoarthritis of both knees 10/28/2020   Irritable bowel syndrome with diarrhea 10/16/2019   Tendinitis of right rotator cuff 09/17/2019   Hand pain, right 05/21/2019   Labral tear of hip joint 05/21/2019   Type 2 diabetes mellitus with diabetic neuropathy, unspecified ([place] 10/01/2018   Right hip pain 05/22/2018   DTennis MustQuervain's disease (radial styloid tenosynovitis) 04/02/2018   Carpal tunnel syndrome, right upper limb 12/31/2017   Elbow injury, right, initial encounter 12/16/2017   Chronic pain of both shoulders 11/30/2017   PTSD (post-traumatic stress disorder)  10/25/2016   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[place]) 10/25/2016   Diabetes mellitus ([place]) 10/25/2016   Major depressive disorder, recurrent episode, severe, with psychosis ([place]) 10/20/2016   Major depressive disorder, recurrent episode, severe, 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[place]) 10/19/2016   TIA (transient ischemic attack) 05/16/2016   Arterial ischemic stroke, MCA, left, acute ([place]) 03/29/2016   Plantar fasciitis 08/08/2013   Obesity, Class III, BMI 40-49.9 (morbid obesity) ([place]) 04/09/2013   Unspecified sleep apnea 03/31/2013   PCO (polycystic ovaries) 04/11/2012   Neck pain 11/21/2010   FATTY LIVER DISEASE  01/26/2010   NAUSEA WITH VOMITING 01/26/2010   PORTAL HYPERTENSION 01/26/2010   Migraine headache 06/28/2009   BREAST MASS 08/21/2008   KNEE PAIN 04/14/2008   Hypothyroidism 03/17/2008   Hyperlipidemia 03/17/2008   DEPRESSION 03/17/2008   NEUROPATHY 03/17/2008   Essential hypertension 03/17/2008   ASTHMA 03/17/2008   GERD 03/17/2008   Headache 03/17/2008    Artist Pais, PTA 09/01/2021, 3:12 PM  [facility] [address]  [place], [place], [address] Phone: [phone]   Fax:  [phone]  Name: MACKAYLA MULLINS MRN: [id number] Date of Birth: 07/19/1976

## 2021-09-02 ENCOUNTER — Other Ambulatory Visit: Payer: Self-pay

## 2021-09-02 ENCOUNTER — Ambulatory Visit: Payer: BC Managed Care – PPO | Admitting: Physical Therapy

## 2021-09-02 DIAGNOSIS — M25561 Pain in right knee: Secondary | ICD-10-CM

## 2021-09-02 DIAGNOSIS — M6281 Muscle weakness (generalized): Secondary | ICD-10-CM

## 2021-09-02 DIAGNOSIS — R2689 Other abnormalities of gait and mobility: Secondary | ICD-10-CM

## 2021-09-02 DIAGNOSIS — M25661 Stiffness of right knee, not elsewhere classified: Secondary | ICD-10-CM

## 2021-09-02 DIAGNOSIS — R600 Localized edema: Secondary | ICD-10-CM

## 2021-09-02 NOTE — Patient Instructions (Signed)

## 2021-09-02 NOTE — Therapy (Signed)
[facility] [address]  [place], [place], [address] Phone: [phone]   Fax:  [phone]  Physical Therapy Treatment  Patient Details  Name: Miranda [name] MRN: [id number] Date of Birth: [birth year] Referring Provider (PT): Marcene Duos   Encounter Date: 09/02/2021   PT End of Session - 09/02/21 0846     Visit Number 15    Number of Visits 30    Date for PT Re-Evaluation 09/27/21    Authorization Type BCBS    Authorization - Number of Visits 27    PT Start Time 0846    PT Stop Time 0930    PT Time Calculation (min) 44 min    Activity Tolerance Patient tolerated treatment well;Patient limited by pain    Behavior During Therapy [facility] for tasks assessed/performed             Past Medical History:  Diagnosis Date   Anginal pain ([place])    Anxiety    Arthritis    Asthma    Bowel obstruction ([place])    Cardiac arrhythmia    Depression    sees Dr. Matilde Haymaker in [place]    Diabetes mellitus    sees Dr. Madelin Rear    Dyspnea    Dysrhythmia    GERD (gastroesophageal reflux disease)    Hyperlipidemia    Hypertension    Hypothyroidism    sees Dr. Elyse Hsu   Insomnia    Migraine syndrome    sees Dr. 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 08/2017   Stroke ([place]) 03/25/2016   left MCA    Stroke [facility]) 04/2016   sees Dr. Teodora Medici at [facility]     Past Surgical History:  Procedure Laterality Date   blocked itestinal repair  1975   age 47 months   BREAST BIOPSY Left 2017   CHOLECYSTECTOMY  2011   INDUCED ABORTION  1996   forced abortion   INGUINAL HERNIA REPAIR Left 1980   age 45   KNEE CLOSED REDUCTION Right 08/22/2021   Procedure: RIGHT  KNEE MANIPULATION  UNDER ANESTHESIA;  Surgeon: Meredith Pel, MD;  Location: [place];  Service: Orthopedics;  Laterality: Right;   LAPAROSCOPIC ENDOMETRIOSIS FULGURATION  1998   TOTAL KNEE ARTHROPLASTY Right 07/05/2021   Procedure: TOTAL KNEE ARTHROPLASTY;  Surgeon: Meredith Pel, MD;  Location: [place];  Service: Orthopedics;  Laterality: Right;   WISDOM TOOTH EXTRACTION  2007    There were no vitals filed for this visit.   Subjective Assessment - 09/02/21 0851     Subjective Pt reports pain still worst in the mornings but improves as the day goes on and gradually coming down overall.    Pertinent History depression, PTSD,migraine/ TIA (loss of sensation on L side), L ischemic stroke, T2DM, irregular heartbeat, gastroparesis, IBS, asthma, GERD    Diagnostic tests X-ray    Patient Stated Goals normal function on knee, get it strainght and get it bent    Currently in Pain? Yes    Pain Score 5     Pain Location Knee    Pain Orientation Right    Pain Descriptors / Indicators Aching;Tightness  Pain Type Acute pain;Surgical pain    Pain Frequency Constant                               [facility] Adult PT Treatment/Exercise - 09/02/21 0001       Knee/Hip Exercises: Stretches   Passive Hamstring Stretch Right;2 reps;30 seconds    Passive Hamstring Stretch Limitations seated hip hinge + strap for gastroc stretch    Hip Flexor Stretch Right;2 reps;30 seconds    Hip Flexor Stretch Limitations mod thomas + quad stretch - manual overpressure & contract/relax with PT assist      Knee/Hip Exercises: Aerobic   Recumbent Bike seat 4 - full and partial rev x 6 min      Knee/Hip Exercises: Seated   Heel Slides Right;AROM;10 reps      Knee/Hip Exercises: Supine   Heel Slides Right;AROM;AAROM;10 reps    Heel Slides Limitations PT assist      Manual Therapy   Manual Therapy Soft tissue mobilization;Myofascial release;Muscle Energy Technique    Manual therapy comments  skilled palpation and monitoring during DN    Soft tissue mobilization STM to R distal quads and HS, R gastroc    Myofascial Release manual TPR and pin & stretch to R lateral quads, HS & gastroc    Muscle Energy Technique contract/relax into R knee flexion in  mod thomas stretch position              Trigger Point Dry Needling - 09/02/21 0846     Consent Given? Yes    Education Handout Provided Yes    Muscles Treated Lower Quadrant Quadriceps;Vastus intermedius;Vastus lateralis;Rectus femoris;Hamstring;Gastrocnemius   Right   Quadriceps Response Twitch response elicited;Palpable increased muscle length    Rectus femoris Response Twitch response elicited;Palpable increased muscle length    Vastus lateralis Response Twitch response elicited;Palpable increased muscle length    Vastus intermedius Response Twitch response elicited;Palpable increased muscle length    Hamstring Response Twitch response elicited;Palpable increased muscle length   R lateral   Gastrocnemius Response Twitch response elicited;Palpable increased muscle length   R lateral                  PT Education - 09/02/21 0855     Education Details DN rational, procedure, outcomes, potential side effects, and recommended post-treatment exercises/activity    Person(s) Educated Patient    Methods Explanation;Handout    Comprehension Verbalized understanding              PT Short Term Goals - 08/11/21 1518       PT SHORT TERM GOAL #1   Title Ind with initial HEP    Time 2    Period Weeks    Status Achieved    Target Date 08/16/21               PT Long Term Goals - 08/31/21 1044       PT LONG TERM GOAL #1   Title Ind with progressed HEP for LE strengthening/gait    Time 8    Period Weeks    Status On-going   met for current   Target Date 09/27/21      PT LONG TERM GOAL #2   Title Pt. will improve R knee extension to no more than lacking 5 deg full extension for safety with gait.     Baseline lacking 20 deg R knee extension.    Time 8  Period Weeks    Status On-going   10/13- lacking 7 degrees 10/25- lacking 10 deg  11/2- lacking 14 deg   Target Date 09/27/21      PT LONG TERM GOAL #3   Title Pt. will demonstrate 115 deg R knee flexion to be able to perform activities safely    Baseline 80 deg R knee flexion    Time 8    Period Weeks    Status On-going   10/13- 90 deg 10/25- 96 deg 08/31/21- 100 deg   Target Date 09/27/21      PT LONG TERM GOAL #4   Title Pt. will be able to ambulate 1000' without AD safely without limitations from pain to access community.    Baseline 2WRW, slow antalgic gait    Time 8    Period Weeks    Status On-going   10/13- using [facility] 11/2- able to ambulate without AD but antalgic   Target Date 09/27/21      PT LONG TERM GOAL #5   Title Patient will demonstrate improved functional LE strength by completing 5xSTS < 14 seconds.    Baseline 24 seconds.    Time 8    Period Weeks    Status On-going      PT LONG TERM GOAL #6   Title Pt. will report no more than 2/10 R knee pain with activity.    Baseline 5-8/10 depending on activity    Time 8    Period Weeks    Status On-going   10/25- 5/10 with bike                  Plan - 09/02/21 0853     Clinical Impression Statement [name] reports pain and stiffness typically worst in the morning and easing somewhat as the day progresses, with overall pain gradually subsiding although worse this morning than yesterday. She still feels the muscle tightness in the areas identified and address yesterday during PT visit, therefore discussed potential trial of DN to help reduce muscle tension and guarding which is limiting her ROM progression and contributing to her pain. After explanation of DN rational, procedures, outcomes and potential side effects, patient verbalized consent to DN treatment in conjunction with manual STM/DTM and TPR to reduce ttp/muscle tension. Muscles treated include R  quads (mid VL, VI & RF), mid lateral HS and lateral gastroc. DN produced normal response with good twitches elicited resulting in palpable reduction in pain/ttp and muscle tension. Pt educated to expect mild to moderate muscle soreness for up to 24-48 hrs and instructed to continue prescribed home exercise program and current activity level with pt verbalizing understanding of theses instructions.    Personal Factors and Comorbidities Comorbidity 3+    Comorbidities depression, PTSD, obesity, TIA/migraine, T2DM, hypertension, HT, gastroparesis, IBS, asthma, GERD, L knee OA, history ischemic stroke, heart irregularities    Rehab Potential Good    PT Frequency Other (comment)   5x per week for 2 weeks, decrease to 1-2x per week as appropriate   PT Duration 8 weeks    PT Treatment/Interventions ADLs/Self Care Home Management;Cryotherapy;Electrical Stimulation;Iontophoresis 56m/ml Dexamethasone;Moist Heat;Ultrasound;Gait training;Stair training;Functional mobility training;Therapeutic activities;Therapeutic exercise;Balance training;Neuromuscular re-education;Patient/family education;Manual techniques;Scar mobilization;Passive range of motion;Dry needling;Taping;Vasopneumatic Device;Joint Manipulations    PT Next Visit Plan assess response to DN, STM to RF, lat quads, and post knee; progress R knee ROM and stregth as tolerated.    PT Home Exercise Plan Access Code: [email or web address]    NOIBBCWUGand Agree with Plan of  Care Patient             Patient will benefit from skilled therapeutic intervention in order to improve the following deficits and impairments:  Abnormal gait, Decreased activity tolerance, Decreased endurance, Decreased range of motion, Decreased skin integrity, Decreased strength, Hypomobility, Increased fascial restricitons, Impaired sensation, Pain, Decreased mobility, Decreased scar mobility, Difficulty walking, Increased edema, Increased muscle spasms, Impaired flexibility  Visit  Diagnosis: Acute pain of right knee  Stiffness of right knee, not elsewhere classified  Other abnormalities of gait and mobility  Muscle weakness (generalized)  Localized edema     Problem List Patient Active Problem List   Diagnosis Date Noted   Arthrofibrosis of knee joint, right    Arthritis of right knee    Diabetic gastroparesis ([place]) 07/20/2021   Constipation 07/20/2021   S/P total knee arthroplasty, right 07/05/2021   Chronic diarrhea 04/29/2021   Belching 04/29/2021   Primary osteoarthritis of both knees 10/28/2020   Irritable bowel syndrome with diarrhea 10/16/2019   Tendinitis of right rotator cuff 09/17/2019   Hand pain, right 05/21/2019   Labral tear of hip joint 05/21/2019   Type 2 diabetes mellitus with diabetic neuropathy, unspecified ([place]) 10/01/2018   Right hip pain 05/22/2018   Tennis Must Quervain's disease (radial styloid tenosynovitis) 04/02/2018   Carpal tunnel syndrome, right upper limb 12/31/2017   Elbow injury, right, initial encounter 12/16/2017   Chronic pain of both shoulders 11/30/2017   PTSD (post-traumatic stress disorder) 10/25/2016   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[place]) 10/25/2016   Diabetes mellitus ([place]) 10/25/2016   Major depressive disorder, recurrent episode, severe, with psychosis ([place]) 10/20/2016   Major depressive disorder, recurrent episode, severe, 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[place]) 10/19/2016   TIA (transient ischemic attack) 05/16/2016   Arterial ischemic stroke, MCA, left, acute ([place]) 03/29/2016   Plantar fasciitis 08/08/2013   Obesity, Class III, BMI 40-49.9 (morbid obesity) ([place]) 04/09/2013   Unspecified sleep apnea 03/31/2013   PCO (polycystic ovaries) 04/11/2012   Neck pain 11/21/2010   FATTY LIVER DISEASE 01/26/2010   NAUSEA WITH VOMITING 01/26/2010   PORTAL HYPERTENSION 01/26/2010   Migraine headache 06/28/2009   BREAST MASS 08/21/2008   KNEE PAIN 04/14/2008   Hypothyroidism 03/17/2008   Hyperlipidemia 03/17/2008    DEPRESSION 03/17/2008   NEUROPATHY 03/17/2008   Essential hypertension 03/17/2008   ASTHMA 03/17/2008   GERD 03/17/2008   Headache 03/17/2008    Percival Spanish, PT 09/02/2021, 12:39 PM  [facility] [address]  [place], [place], [address] Phone: [phone]   Fax:  [phone]  Name: ANGELIZE RYCE MRN: [id number] Date of Birth: 1976-08-17

## 2021-09-05 ENCOUNTER — Ambulatory Visit: Payer: BC Managed Care – PPO

## 2021-09-07 ENCOUNTER — Encounter: Payer: Self-pay | Admitting: Physical Therapy

## 2021-09-07 ENCOUNTER — Ambulatory Visit: Payer: BC Managed Care – PPO | Admitting: Physical Therapy

## 2021-09-07 ENCOUNTER — Other Ambulatory Visit: Payer: Self-pay

## 2021-09-07 DIAGNOSIS — M6281 Muscle weakness (generalized): Secondary | ICD-10-CM

## 2021-09-07 DIAGNOSIS — R600 Localized edema: Secondary | ICD-10-CM

## 2021-09-07 DIAGNOSIS — M25661 Stiffness of right knee, not elsewhere classified: Secondary | ICD-10-CM

## 2021-09-07 DIAGNOSIS — M25561 Pain in right knee: Secondary | ICD-10-CM | POA: Diagnosis not present

## 2021-09-07 DIAGNOSIS — R2689 Other abnormalities of gait and mobility: Secondary | ICD-10-CM

## 2021-09-07 NOTE — Therapy (Signed)
[facility] [address]  [place], [place], [address] Phone: [phone]   Fax:  [phone]  Physical Therapy Treatment  Patient Details  Name: Miranda [name] MRN: [id number] Date of Birth: [birth year] Referring Provider (PT): Marcene Duos   Encounter Date: 09/07/2021   PT End of Session - 09/07/21 1321     Visit Number 16    Number of Visits 30    Date for PT Re-Evaluation 09/27/21    Authorization Type BCBS    Authorization - Number of Visits 49    PT Start Time 9702    PT Stop Time 1420    PT Time Calculation (min) 63 min    Activity Tolerance Patient tolerated treatment well;Patient limited by pain    Behavior During Therapy [facility] for tasks assessed/performed             Past Medical History:  Diagnosis Date   Anginal pain ([place])    Anxiety    Arthritis    Asthma    Bowel obstruction ([place])    Cardiac arrhythmia    Depression    sees Dr. Matilde Haymaker in [place]    Diabetes mellitus    sees Dr. Madelin Rear    Dyspnea    Dysrhythmia    GERD (gastroesophageal reflux disease)    Hyperlipidemia    Hypertension    Hypothyroidism    sees Dr. Elyse Hsu   Insomnia    Migraine syndrome    sees Dr. 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 08/2017   Stroke ([place]) 03/25/2016   left MCA    Stroke [facility]) 04/2016   sees Dr. Teodora Medici at [facility]     Past Surgical History:  Procedure Laterality Date   blocked itestinal repair  1975   age 89 months   BREAST BIOPSY Left 2017   CHOLECYSTECTOMY  2011   INDUCED ABORTION  1996   forced abortion   INGUINAL HERNIA REPAIR Left 1980   age 43   KNEE CLOSED REDUCTION Right 08/22/2021   Procedure: RIGHT  KNEE MANIPULATION  UNDER ANESTHESIA;  Surgeon: Meredith Pel, MD;  Location: [place];  Service: Orthopedics;  Laterality: Right;   LAPAROSCOPIC ENDOMETRIOSIS FULGURATION  1998   TOTAL KNEE ARTHROPLASTY Right 07/05/2021   Procedure: TOTAL KNEE ARTHROPLASTY;  Surgeon: Meredith Pel, MD;  Location: [place];  Service: Orthopedics;  Laterality: Right;   WISDOM TOOTH EXTRACTION  2007    There were no vitals filed for this visit.   Subjective Assessment - 09/07/21 1320     Subjective Patient reports pain increases sharply when standing up then improves as walks around. Felt the dry needling last session helped significantly.    Pertinent History depression, PTSD,migraine/ TIA (loss of sensation on L side), L ischemic stroke, T2DM, irregular heartbeat, gastroparesis, IBS, asthma, GERD    Diagnostic tests X-ray    Patient Stated Goals normal function on knee, get it strainght and get it bent    Currently in Pain? Yes    Pain Score 5     Pain Location Knee    Pain Orientation Right  [facility] Adult PT Treatment/Exercise - 09/07/21 0001       Exercises   Exercises Knee/Hip      Knee/Hip Exercises: Stretches   Other Knee/Hip Stretches runners stretch in standing, both for R knee extension and R knee flexion (alternating sides 2 x 30 sec bil)      Knee/Hip Exercises: Aerobic   Stationary Bike seat 4 full revolutions foward and back x 8 min      Modalities   Modalities Cryotherapy;Electrical Stimulation      Cryotherapy   Number Minutes Cryotherapy 10 Minutes    Cryotherapy Location Knee   right   Type of Cryotherapy Ice pack      Electrical Stimulation   Electrical Stimulation Location R quad    Electrical Stimulation Action IFC    Electrical Stimulation Goals Edema;Pain      Manual Therapy   Manual Therapy Joint mobilization;Soft tissue mobilization;Myofascial release;Other (comment);Passive ROM    Manual therapy comments  skilled palpation and monitoring during DN    Joint Mobilization patellar mobs, ER and AP mobs of femur on tibia to improve extension, in sitting PA mob + traction and IR glide to improve flexion    Soft tissue mobilization STM to R distal quads and HS, R gastroc    Myofascial Release manual TPR to R ant tib, soleus    Passive ROM R knee extension with overpressure              Trigger Point Dry Needling - 09/07/21 0001     Consent Given? Yes    Education Handout Provided Previously provided    Muscles Treated Lower Quadrant Anterior tibialis;Peroneals;Soleus   Right   Peroneals Response Twitch response elicited;Palpable increased muscle length    Anterior tibialis Response Twitch response elicited;Palpable increased muscle length    Soleus Response Twitch response elicited;Palpable increased muscle length                     PT Short Term Goals - 08/11/21 1518       PT SHORT TERM GOAL #1   Title Ind with initial HEP    Time 2    Period Weeks    Status Achieved    Target Date 08/16/21               PT Long Term Goals - 08/31/21 1044       PT LONG TERM GOAL #1   Title Ind with progressed HEP for LE strengthening/gait    Time 8    Period Weeks    Status On-going   met for current   Target Date 09/27/21      PT LONG TERM GOAL #2   Title Pt. will improve R knee extension to no more than lacking 5 deg full extension for safety with gait.    Baseline lacking 20 deg R knee extension.    Time 8    Period Weeks    Status On-going   10/13- lacking 7 degrees 10/25- lacking 10 deg  11/2- lacking 14 deg   Target Date 09/27/21      PT LONG TERM GOAL #3   Title Pt. will demonstrate 115 deg R knee flexion to be able to perform activities safely    Baseline 80 deg R knee flexion    Time 8    Period Weeks    Status On-going   10/13- 90 deg 10/25- 96 deg 08/31/21- 100 deg   Target Date 09/27/21  PT LONG TERM GOAL #4   Title Pt. will be able to ambulate  1000' without AD safely without limitations from pain to access community.    Baseline 2WRW, slow antalgic gait    Time 8    Period Weeks    Status On-going   10/13- using [facility] 11/2- able to ambulate without AD but antalgic   Target Date 09/27/21      PT LONG TERM GOAL #5   Title Patient will demonstrate improved functional LE strength by completing 5xSTS < 14 seconds.    Baseline 24 seconds.    Time 8    Period Weeks    Status On-going      PT LONG TERM GOAL #6   Title Pt. will report no more than 2/10 R knee pain with activity.    Baseline 5-8/10 depending on activity    Time 8    Period Weeks    Status On-going   10/25- 5/10 with bike                  Plan - 09/07/21 1322     Clinical Impression Statement Miranda [name] reports positive result from dry needling last session, feeling that she was able to move her knee better.  She still reported tightness and pulling in anterior knee distal to condyles, and had significant tightness/tenderpoints in ant tib and peroneals, so discussed and obtained consent to dry needle these areas today, after which she reported improve ankle mobility as well as improved tolerance to runner's stretch.  She stilll has significant swelling in her R knee.  Focus of today's interventions was on manual therapy to improve ROM, followed by runner's stretch then CP to R knee with IFC while knee in extended position for long duration/low load stretch, which she was able to tolerate.  Reported feeling like "cuff" was gone around her knee after today's session, and noted improved extension with gait.  She would benefit from continued skilled therapy.    Personal Factors and Comorbidities Comorbidity 3+    Comorbidities depression, PTSD, obesity, TIA/migraine, T2DM, hypertension, HT, gastroparesis, IBS, asthma, GERD, L knee OA, history ischemic stroke, heart irregularities    Rehab Potential Good    PT Frequency Other (comment)   5x per week for 2 weeks, decrease  to 1-2x per week as appropriate   PT Duration 8 weeks    PT Treatment/Interventions ADLs/Self Care Home Management;Cryotherapy;Electrical Stimulation;Iontophoresis 7m/ml Dexamethasone;Moist Heat;Ultrasound;Gait training;Stair training;Functional mobility training;Therapeutic activities;Therapeutic exercise;Balance training;Neuromuscular re-education;Patient/family education;Manual techniques;Scar mobilization;Passive range of motion;Dry needling;Taping;Vasopneumatic Device;Joint Manipulations    PT Next Visit Plan assess response to DN, STM to RF, lat quads, and post knee; progress R knee ROM and stregth as tolerated.    PT Home Exercise Plan Access Code: [email or web address]    [email or web address] Agree with Plan of Care Patient             Patient will benefit from skilled therapeutic intervention in order to improve the following deficits and impairments:  Abnormal gait, Decreased activity tolerance, Decreased endurance, Decreased range of motion, Decreased skin integrity, Decreased strength, Hypomobility, Increased fascial restricitons, Impaired sensation, Pain, Decreased mobility, Decreased scar mobility, Difficulty walking, Increased edema, Increased muscle spasms, Impaired flexibility  Visit Diagnosis: Acute pain of right knee  Stiffness of right knee, not elsewhere classified  Other abnormalities of gait and mobility  Muscle weakness (generalized)  Localized edema     Problem List Patient Active Problem List   Diagnosis Date Noted   Arthrofibrosis of knee  joint, right    Arthritis of right knee    Diabetic gastroparesis ([place]) 07/20/2021   Constipation 07/20/2021   S/P total knee arthroplasty, right 07/05/2021   Chronic diarrhea 04/29/2021   Belching 04/29/2021   Primary osteoarthritis of both knees 10/28/2020   Irritable bowel syndrome with diarrhea 10/16/2019   Tendinitis of right rotator cuff 09/17/2019   Hand pain, right 05/21/2019   Labral tear of hip joint 05/21/2019   Type 2  diabetes mellitus with diabetic neuropathy, unspecified ([place]) 10/01/2018   Right hip pain 05/22/2018   Tennis Must Quervain's disease (radial styloid tenosynovitis) 04/02/2018   Carpal tunnel syndrome, right upper limb 12/31/2017   Elbow injury, right, initial encounter 12/16/2017   Chronic pain of both shoulders 11/30/2017   PTSD (post-traumatic stress disorder) 10/25/2016   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[place]) 10/25/2016   Diabetes mellitus ([place]) 10/25/2016   Major depressive disorder, recurrent episode, severe, with psychosis ([place]) 10/20/2016   Major depressive disorder, recurrent episode, severe, 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[place]) 10/19/2016   TIA (transient ischemic attack) 05/16/2016   Arterial ischemic stroke, MCA, left, acute ([place]) 03/29/2016   Plantar fasciitis 08/08/2013   Obesity, Class III, BMI 40-49.9 (morbid obesity) ([place]) 04/09/2013   Unspecified sleep apnea 03/31/2013   PCO (polycystic ovaries) 04/11/2012   Neck pain 11/21/2010   FATTY LIVER DISEASE 01/26/2010   NAUSEA WITH VOMITING 01/26/2010   PORTAL HYPERTENSION 01/26/2010   Migraine headache 06/28/2009   BREAST MASS 08/21/2008   KNEE PAIN 04/14/2008   Hypothyroidism 03/17/2008   Hyperlipidemia 03/17/2008   DEPRESSION 03/17/2008   NEUROPATHY 03/17/2008   Essential hypertension 03/17/2008   ASTHMA 03/17/2008   GERD 03/17/2008   Headache 03/17/2008    Rennie Natter, PT, DPT 09/07/2021, 2:35 PM  [facility] [address]  [place], [place], [address] Phone: [phone]   Fax:  [phone]  Name: Miranda [name] MRN: [id number] Date of Birth: 12-Jun-1976

## 2021-09-08 ENCOUNTER — Ambulatory Visit: Payer: BC Managed Care – PPO

## 2021-09-08 DIAGNOSIS — M25561 Pain in right knee: Secondary | ICD-10-CM

## 2021-09-08 DIAGNOSIS — M25661 Stiffness of right knee, not elsewhere classified: Secondary | ICD-10-CM

## 2021-09-08 DIAGNOSIS — M6281 Muscle weakness (generalized): Secondary | ICD-10-CM

## 2021-09-08 DIAGNOSIS — R600 Localized edema: Secondary | ICD-10-CM

## 2021-09-08 DIAGNOSIS — R2689 Other abnormalities of gait and mobility: Secondary | ICD-10-CM

## 2021-09-08 NOTE — Therapy (Signed)
[facility] [address]  [place], [place], [address] Phone: [phone]   Fax:  [phone]  Physical Therapy Treatment  Patient Details  Name: Miranda [name] MRN: [id number] Date of Birth: [birth year] Referring Provider (PT): Marcene Duos   Encounter Date: 09/08/2021   PT End of Session - 09/08/21 1101     Visit Number 17    Number of Visits 30    Date for PT Re-Evaluation 09/27/21    Authorization Type BCBS    Authorization - Number of Visits 33    PT Start Time 1017    PT Stop Time 1100    PT Time Calculation (min) 43 min    Activity Tolerance Patient tolerated treatment well;Patient limited by pain    Behavior During Therapy [facility] for tasks assessed/performed             Past Medical History:  Diagnosis Date   Anginal pain ([place])    Anxiety    Arthritis    Asthma    Bowel obstruction ([place])    Cardiac arrhythmia    Depression    sees Dr. Matilde Haymaker in [place]    Diabetes mellitus    sees Dr. Madelin Rear    Dyspnea    Dysrhythmia    GERD (gastroesophageal reflux disease)    Hyperlipidemia    Hypertension    Hypothyroidism    sees Dr. Elyse Hsu   Insomnia    Migraine syndrome    sees Dr. Teod[facility] at [facility]    Morbid obesity [facility])    Multiple personality ([place])    Neck pain    Neuropathy associated with endocrine disorder ([place])    Sleep apnea with use of continuous positive airway pressure (CPAP)    Sleep apnea is resolved due to weight loss. 08/2017   Stroke ([place]) 03/25/2016   left MCA    Stroke [facility]) 04/2016   sees Dr. Teodora Medici at [facility]     Past Surgical History:  Procedure Laterality Date   blocked itestinal repair  1975   age 47 months   BREAST BIOPSY Left 2017   CHOLECYSTECTOMY  2011   INDUCED ABORTION  1996   forced abortion   INGUINAL HERNIA REPAIR Left 1980   age 45   KNEE CLOSED REDUCTION Right 08/22/2021   Procedure: RIGHT  KNEE MANIPULATION  UNDER ANESTHESIA;  Surgeon: Meredith Pel, MD;  Location: [place];  Service: Orthopedics;  Laterality: Right;   LAPAROSCOPIC ENDOMETRIOSIS FULGURATION  1998   TOTAL KNEE ARTHROPLASTY Right 07/05/2021   Procedure: TOTAL KNEE ARTHROPLASTY;  Surgeon: Meredith Pel, MD;  Location: [place];  Service: Orthopedics;  Laterality: Right;   WISDOM TOOTH EXTRACTION  2007    There were no vitals filed for this visit.   Subjective Assessment - 09/08/21 1020     Subjective "Extension is the biggest problem now."    Pertinent History depression, PTSD,migraine/ TIA (loss of sensation on L side), L ischemic stroke, T2DM, irregular heartbeat, gastroparesis, IBS, asthma, GERD    Diagnostic tests X-ray    Patient Stated Goals normal function on knee, get it strainght and get it bent    Currently in Pain? Yes    Pain Score 3     Pain Location Knee    Pain Orientation Right    Pain Descriptors / Indicators Aching;Tightness    Pain Type Acute pain;Surgical pain  [facility] PT Assessment - 09/08/21 0001       AROM   Right Knee Extension 7    Right Knee Flexion 100                           [facility] Adult PT Treatment/Exercise - 09/08/21 0001       Exercises   Exercises Knee/Hip      Knee/Hip Exercises: Stretches   Quad Stretch Right;2 reps;30 seconds    Quad Stretch Limitations manual stretch in prone with hip extended    Other Knee/Hip Stretches knee extension stretch manually with therapist OP 3x30"      Knee/Hip Exercises: Aerobic   Stationary Bike partial rev 6 min      Knee/Hip Exercises: Machines for Strengthening   Cybex Leg Press 20# 2x10, 5# 10 reps      Knee/Hip Exercises: Supine   Straight Leg Raises Strengthening;Right;2 sets;10 reps      Manual Therapy   Manual Therapy Passive ROM    Passive ROM R knee flexion and extension                       PT Short Term Goals - 08/11/21 1518       PT SHORT TERM  GOAL #1   Title Ind with initial HEP    Time 2    Period Weeks    Status Achieved    Target Date 08/16/21               PT Long Term Goals - 08/31/21 1044       PT LONG TERM GOAL #1   Title Ind with progressed HEP for LE strengthening/gait    Time 8    Period Weeks    Status On-going   met for current   Target Date 09/27/21      PT LONG TERM GOAL #2   Title Pt. will improve R knee extension to no more than lacking 5 deg full extension for safety with gait.    Baseline lacking 20 deg R knee extension.    Time 8    Period Weeks    Status On-going   10/13- lacking 7 degrees 10/25- lacking 10 deg  11/2- lacking 14 deg   Target Date 09/27/21      PT LONG TERM GOAL #3   Title Pt. will demonstrate 115 deg R knee flexion to be able to perform activities safely    Baseline 80 deg R knee flexion    Time 8    Period Weeks    Status On-going   10/13- 90 deg 10/25- 96 deg 08/31/21- 100 deg   Target Date 09/27/21      PT LONG TERM GOAL #4   Title Pt. will be able to ambulate 1000' without AD safely without limitations from pain to access community.    Baseline 2WRW, slow antalgic gait    Time 8    Period Weeks    Status On-going   10/13- using [facility] 11/2- able to ambulate without AD but antalgic   Target Date 09/27/21      PT LONG TERM GOAL #5   Title Patient will demonstrate improved functional LE strength by completing 5xSTS < 14 seconds.    Baseline 24 seconds.    Time 8    Period Weeks    Status On-going      PT LONG TERM GOAL #6   Title Pt.  will report no more than 2/10 R knee pain with activity.    Baseline 5-8/10 depending on activity    Time 8    Period Weeks    Status On-going   10/25- 5/10 with bike                  Plan - 09/08/21 1104     Clinical Impression Statement Pt still with limited tolerance for passive stretches for the posterior knee today. We were able to get extension to 7 deg today after heavy stretching and manual. Worked a little  more on strength today to improve function with gait and standing activites at home. RF still very tight in prone postion with hip extended.    Personal Factors and Comorbidities Comorbidity 3+    Comorbidities depression, PTSD, obesity, TIA/migraine, T2DM, hypertension, HT, gastroparesis, IBS, asthma, GERD, L knee OA, history ischemic stroke, heart irregularities    PT Frequency Other (comment)    PT Duration 8 weeks    PT Treatment/Interventions ADLs/Self Care Home Management;Cryotherapy;Electrical Stimulation;Iontophoresis 41m/ml Dexamethasone;Moist Heat;Ultrasound;Gait training;Stair training;Functional mobility training;Therapeutic activities;Therapeutic exercise;Balance training;Neuromuscular re-education;Patient/family education;Manual techniques;Scar mobilization;Passive range of motion;Dry needling;Taping;Vasopneumatic Device;Joint Manipulations    PT Next Visit Plan STM to RF, lat quads, and post knee; progress R knee ROM and stregth as tolerated.    PT Home Exercise Plan Access Code: [email or web address]    [email or web address] Agree with Plan of Care Patient             Patient will benefit from skilled therapeutic intervention in order to improve the following deficits and impairments:  Abnormal gait, Decreased activity tolerance, Decreased endurance, Decreased range of motion, Decreased skin integrity, Decreased strength, Hypomobility, Increased fascial restricitons, Impaired sensation, Pain, Decreased mobility, Decreased scar mobility, Difficulty walking, Increased edema, Increased muscle spasms, Impaired flexibility  Visit Diagnosis: Acute pain of right knee  Stiffness of right knee, not elsewhere classified  Other abnormalities of gait and mobility  Muscle weakness (generalized)  Localized edema     Problem List Patient Active Problem List   Diagnosis Date Noted   Arthrofibrosis of knee joint, right    Arthritis of right knee    Diabetic gastroparesis ([place] 07/20/2021    Constipation 07/20/2021   S/P total knee arthroplasty, right 07/05/2021   Chronic diarrhea 04/29/2021   Belching 04/29/2021   Primary osteoarthritis of both knees 10/28/2020   Irritable bowel syndrome with diarrhea 10/16/2019   Tendinitis of right rotator cuff 09/17/2019   Hand pain, right 05/21/2019   Labral tear of hip joint 05/21/2019   Type 2 diabetes mellitus with diabetic neuropathy, unspecified ([place] 10/01/2018   Right hip pain 05/22/2018   DTennis MustQuervain's disease (radial styloid tenosynovitis) 04/02/2018   Carpal tunnel syndrome, right upper limb 12/31/2017   Elbow injury, right, initial encounter 12/16/2017   Chronic pain of both shoulders 11/30/2017   PTSD (post-traumatic stress disorder) 10/25/2016   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[place] 10/25/2016   Diabetes mellitus ([place] 10/25/2016   Major depressive disorder, recurrent episode, severe, with psychosis ([place] 10/20/2016   Major depressive disorder, recurrent episode, severe, 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[place] 10/19/2016   TIA (transient ischemic attack) 05/16/2016   Arterial ischemic stroke, MCA, left, acute ([place] 03/29/2016   Plantar fasciitis 08/08/2013   Obesity, Class III, BMI 40-49.9 (morbid obesity) ([place] 04/09/2013   Unspecified sleep apnea 03/31/2013   PCO (polycystic ovaries) 04/11/2012   Neck pain 11/21/2010   FATTY LIVER DISEASE 01/26/2010   NAUSEA WITH VOMITING 01/26/2010   PORTAL HYPERTENSION 01/26/2010  Migraine headache 06/28/2009   BREAST MASS 08/21/2008   KNEE PAIN 04/14/2008   Hypothyroidism 03/17/2008   Hyperlipidemia 03/17/2008   DEPRESSION 03/17/2008   NEUROPATHY 03/17/2008   Essential hypertension 03/17/2008   ASTHMA 03/17/2008   GERD 03/17/2008   Headache 03/17/2008    Artist Pais, PTA 09/08/2021, 11:44 AM  [facility] [address]  [place], [place], [address] Phone: [phone]   Fax:  [phone]  Name: NAOKO DIPERNA MRN: [id number] Date of Birth: 04-08-1976

## 2021-09-12 ENCOUNTER — Encounter: Payer: Self-pay | Admitting: Physical Therapy

## 2021-09-12 ENCOUNTER — Ambulatory Visit: Payer: BC Managed Care – PPO | Admitting: Physical Therapy

## 2021-09-12 ENCOUNTER — Other Ambulatory Visit: Payer: Self-pay

## 2021-09-12 DIAGNOSIS — M6281 Muscle weakness (generalized): Secondary | ICD-10-CM

## 2021-09-12 DIAGNOSIS — R600 Localized edema: Secondary | ICD-10-CM

## 2021-09-12 DIAGNOSIS — M25561 Pain in right knee: Secondary | ICD-10-CM | POA: Diagnosis not present

## 2021-09-12 DIAGNOSIS — R2689 Other abnormalities of gait and mobility: Secondary | ICD-10-CM

## 2021-09-12 DIAGNOSIS — M25661 Stiffness of right knee, not elsewhere classified: Secondary | ICD-10-CM

## 2021-09-12 NOTE — Therapy (Signed)
[facility] [address]  [place], [place], [address] Phone: [phone]   Fax:  [phone]  Physical Therapy Treatment  Patient Details  Name: Miranda [name] MRN: [id number] Date of Birth: [birth year] Referring Provider (PT): Marcene Duos   Encounter Date: 09/12/2021   PT End of Session - 09/12/21 1325     Visit Number 18    Number of Visits 30    Date for PT Re-Evaluation 09/27/21    Authorization Type BCBS    Authorization - Number of Visits 46    PT Start Time 1320    PT Stop Time 1410    PT Time Calculation (min) 50 min    Activity Tolerance Patient tolerated treatment well;Patient limited by pain    Behavior During Therapy [facility] for tasks assessed/performed             Past Medical History:  Diagnosis Date   Anginal pain ([place])    Anxiety    Arthritis    Asthma    Bowel obstruction ([place])    Cardiac arrhythmia    Depression    sees Dr. Matilde Haymaker in [place]    Diabetes mellitus    sees Dr. Madelin Rear    Dyspnea    Dysrhythmia    GERD (gastroesophageal reflux disease)    Hyperlipidemia    Hypertension    Hypothyroidism    sees Dr. Elyse Hsu   Insomnia    Migraine syndrome    sees Dr. 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 08/2017   Stroke ([place]) 03/25/2016   left MCA    Stroke [facility]) 04/2016   se[facility] Teod[facility] Medici at [facility]     Past Surgical History:  Procedure Laterality Date   blocked itestinal repair  1975   age 66 months   BREAST BIOPSY Left 2017   CHOLECYSTECTOMY  2011   INDUCED ABORTION  1996   forced abortion   INGUINAL HERNIA REPAIR Left 1980   age 34   KNEE CLOSED REDUCTION Right 08/22/2021   Procedure: RIGHT  KNEE MANIPULATION  UNDER ANESTHESIA;  Surgeon: Meredith Pel, MD;  Location: [place];  Service: Orthopedics;  Laterality: Right;   LAPAROSCOPIC ENDOMETRIOSIS FULGURATION  1998   TOTAL KNEE ARTHROPLASTY Right 07/05/2021   Procedure: TOTAL KNEE ARTHROPLASTY;  Surgeon: Meredith Pel, MD;  Location: [place];  Service: Orthopedics;  Laterality: Right;   WISDOM TOOTH EXTRACTION  2007    There were no vitals filed for this visit.   Subjective Assessment - 09/12/21 1323     Subjective Up until today has been very painful especially standing after sitting, but today much less of that happening.  She thinks she is taking meds more regularly which is also helping.   Has been trying to walk more outside.    Pertinent History depression, PTSD,migraine/ TIA (loss of sensation on L side), L ischemic stroke, T2DM, irregular heartbeat, gastroparesis, IBS, asthma, GERD    Diagnostic tests X-ray    Patient Stated Goals normal function on knee, get it strainght and get it bent    Currently in Pain? Yes    Pain Score 3  Pain Location Knee    Pain Orientation Right    Pain Descriptors / Indicators Aching;Tightness                               [facility] Adult PT Treatment/Exercise - 09/12/21 0001       Exercises   Exercises Knee/Hip      Knee/Hip Exercises: Stretches   Other Knee/Hip Stretches knee extension stretch manually with therapist OP 3x30"      Knee/Hip Exercises: Aerobic   Stationary Bike full revolutions x 6 min      Knee/Hip Exercises: Machines for Strengthening   Cybex Knee Extension 5# RLE only 2 x 10   with superior patellar glide facilitated by PT   Cybex Knee Flexion 20# 1 x 10, 25# 2 x 10    Cybex Leg Press 20# 1 x 10, 25# 1 x 10   very challenged with second set     Modalities   Modalities Vasopneumatic      Vasopneumatic   Number Minutes Vasopneumatic  10 minutes    Vasopnuematic Location  Knee    Vasopneumatic Pressure Low    Vasopneumatic  Temperature  35      Manual Therapy   Manual Therapy Joint mobilization;Soft tissue mobilization;Passive ROM    Manual therapy comments to improve R knee ROM    Joint Mobilization patellar mobs focusing on superior glide to improve extension, ER and AP mobs of femur on tibia to improve extension,    Soft tissue mobilization STM to R distal quads and HS, R gastroc    Passive ROM R knee flexion and extension                       PT Short Term Goals - 08/11/21 1518       PT SHORT TERM GOAL #1   Title Ind with initial HEP    Time 2    Period Weeks    Status Achieved    Target Date 08/16/21               PT Long Term Goals - 08/31/21 1044       PT LONG TERM GOAL #1   Title Ind with progressed HEP for LE strengthening/gait    Time 8    Period Weeks    Status On-going   met for current   Target Date 09/27/21      PT LONG TERM GOAL #2   Title Pt. will improve R knee extension to no more than lacking 5 deg full extension for safety with gait.    Baseline lacking 20 deg R knee extension.    Time 8    Period Weeks    Status On-going   10/13- lacking 7 degrees 10/25- lacking 10 deg  11/2- lacking 14 deg   Target Date 09/27/21      PT LONG TERM GOAL #3   Title Pt. will demonstrate 115 deg R knee flexion to be able to perform activities safely    Baseline 80 deg R knee flexion    Time 8    Period Weeks    Status On-going   10/13- 90 deg 10/25- 96 deg 08/31/21- 100 deg   Target Date 09/27/21      PT LONG TERM GOAL #4   Title Pt. will be able to ambulate 1000' without AD safely without limitations from pain to access community.  Baseline 2WRW, slow antalgic gait    Time 8    Period Weeks    Status On-going   10/13- using [facility] 11/2- able to ambulate without AD but antalgic   Target Date 09/27/21      PT LONG TERM GOAL #5   Title Patient will demonstrate improved functional LE strength by completing 5xSTS < 14 seconds.    Baseline 24 seconds.    Time 8     Period Weeks    Status On-going      PT LONG TERM GOAL #6   Title Pt. will report no more than 2/10 R knee pain with activity.    Baseline 5-8/10 depending on activity    Time 8    Period Weeks    Status On-going   10/25- 5/10 with bike                  Plan - 09/12/21 1651     Clinical Impression Statement Patient is reporting less pain and improved mobility today overall.  She still has R knee ROM restrictions with 7-100 today at end of session.  She was able to progress strengthening program without increased knee pain today, although initially reported increased pain with extension, but after PT applied superior glide to patella with extension reported no pain.  She would benefit from continued skilled therapy.    Personal Factors and Comorbidities Comorbidity 3+    Comorbidities depression, PTSD, obesity, TIA/migraine, T2DM, hypertension, HT, gastroparesis, IBS, asthma, GERD, L knee OA, history ischemic stroke, heart irregularities    PT Frequency Other (comment)    PT Duration 8 weeks    PT Treatment/Interventions ADLs/Self Care Home Management;Cryotherapy;Electrical Stimulation;Iontophoresis 88m/ml Dexamethasone;Moist Heat;Ultrasound;Gait training;Stair training;Functional mobility training;Therapeutic activities;Therapeutic exercise;Balance training;Neuromuscular re-education;Patient/family education;Manual techniques;Scar mobilization;Passive range of motion;Dry needling;Taping;Vasopneumatic Device;Joint Manipulations    PT Next Visit Plan STM to RF, lat quads, and post knee; progress R knee ROM and stregth as tolerated.    PT Home Exercise Plan Access Code: [email or web address]    [email or web address] Agree with Plan of Care Patient             Patient will benefit from skilled therapeutic intervention in order to improve the following deficits and impairments:  Abnormal gait, Decreased activity tolerance, Decreased endurance, Decreased range of motion, Decreased skin integrity,  Decreased strength, Hypomobility, Increased fascial restricitons, Impaired sensation, Pain, Decreased mobility, Decreased scar mobility, Difficulty walking, Increased edema, Increased muscle spasms, Impaired flexibility  Visit Diagnosis: Acute pain of right knee  Stiffness of right knee, not elsewhere classified  Other abnormalities of gait and mobility  Muscle weakness (generalized)  Localized edema     Problem List Patient Active Problem List   Diagnosis Date Noted   Arthrofibrosis of knee joint, right    Arthritis of right knee    Diabetic gastroparesis ([place] 07/20/2021   Constipation 07/20/2021   S/P total knee arthroplasty, right 07/05/2021   Chronic diarrhea 04/29/2021   Belching 04/29/2021   Primary osteoarthritis of both knees 10/28/2020   Irritable bowel syndrome with diarrhea 10/16/2019   Tendinitis of right rotator cuff 09/17/2019   Hand pain, right 05/21/2019   Labral tear of hip joint 05/21/2019   Type 2 diabetes mellitus with diabetic neuropathy, unspecified ([place] 10/01/2018   Right hip pain 05/22/2018   DTennis MustQuervain's disease (radial styloid tenosynovitis) 04/02/2018   Carpal tunnel syndrome, right upper limb 12/31/2017   Elbow injury, right, initial encounter 12/16/2017   Chronic pain of both shoulders 11/30/2017  PTSD (post-traumatic stress disorder) 10/25/2016   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[place]) 10/25/2016   Diabetes mellitus ([place]) 10/25/2016   Major depressive disorder, recurrent episode, severe, with psychosis ([place]) 10/20/2016   Major depressive disorder, recurrent episode, severe, 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[place]) 10/19/2016   TIA (transient ischemic attack) 05/16/2016   Arterial ischemic stroke, MCA, left, acute ([place]) 03/29/2016   Plantar fasciitis 08/08/2013   Obesity, Class III, BMI 40-49.9 (morbid obesity) ([place]) 04/09/2013   Unspecified sleep apnea 03/31/2013   PCO (polycystic ovaries) 04/11/2012   Neck pain 11/21/2010   FATTY LIVER DISEASE  01/26/2010   NAUSEA WITH VOMITING 01/26/2010   PORTAL HYPERTENSION 01/26/2010   Migraine headache 06/28/2009   BREAST MASS 08/21/2008   KNEE PAIN 04/14/2008   Hypothyroidism 03/17/2008   Hyperlipidemia 03/17/2008   DEPRESSION 03/17/2008   NEUROPATHY 03/17/2008   Essential hypertension 03/17/2008   ASTHMA 03/17/2008   GERD 03/17/2008   Headache 03/17/2008    Rennie Natter, PT, DPT 09/12/2021, 4:58 PM  [facility] [address]  [place], [place], [address] Phone: [phone]   Fax:  [phone]  Name: Miranda [name] MRN: [id number] Date of Birth: 01/05/1976

## 2021-09-14 ENCOUNTER — Ambulatory Visit: Payer: BC Managed Care – PPO

## 2021-09-14 ENCOUNTER — Other Ambulatory Visit: Payer: Self-pay

## 2021-09-14 DIAGNOSIS — M25561 Pain in right knee: Secondary | ICD-10-CM

## 2021-09-14 DIAGNOSIS — M25661 Stiffness of right knee, not elsewhere classified: Secondary | ICD-10-CM

## 2021-09-14 DIAGNOSIS — R600 Localized edema: Secondary | ICD-10-CM

## 2021-09-14 DIAGNOSIS — M6281 Muscle weakness (generalized): Secondary | ICD-10-CM

## 2021-09-14 DIAGNOSIS — R2689 Other abnormalities of gait and mobility: Secondary | ICD-10-CM

## 2021-09-14 NOTE — Therapy (Signed)
[facility] [address]  [place], [place], [address] Phone: [phone]   Fax:  [phone]  Physical Therapy Treatment  Patient Details  Name: Miranda [name] MRN: [id number] Date of Birth: [birth year] Referring Provider (PT): Marcene Duos   Encounter Date: 09/14/2021   PT End of Session - 09/14/21 1514     Visit Number 19    Number of Visits 30    Date for PT Re-Evaluation 09/27/21    Authorization Type BCBS    Authorization - Number of Visits 56    PT Start Time 1403    PT Stop Time 1449    PT Time Calculation (min) 46 min    Activity Tolerance Patient tolerated treatment well;Patient limited by pain    Behavior During Therapy [facility] for tasks assessed/performed             Past Medical History:  Diagnosis Date   Anginal pain ([place])    Anxiety    Arthritis    Asthma    Bowel obstruction ([place])    Cardiac arrhythmia    Depression    sees Dr. Matilde Haymaker in [place]    Diabetes mellitus    sees Dr. Madelin Rear    Dyspnea    Dysrhythmia    GERD (gastroesophageal reflux disease)    Hyperlipidemia    Hypertension    Hypothyroidism    sees Dr. Elyse Hsu   Insomnia    Migraine syndrome    sees Dr. Teodora Medici at [facility]    Morbid obesity [facility])    Multiple personality ([place])    Neck pain    Neuropathy associated with endocrine disorder ([place])    Sleep apnea with use of continuous positive airway pressure (CPAP)    Sleep apnea is resolved due to weight loss. 08/2017   Stroke ([place]) 03/25/2016   left MCA    Stroke [facility]) 04/2016   sees Dr. Teodora Medici at [facility]     Past Surgical History:  Procedure Laterality Date   blocked itestinal repair  1975   age 29 months   BREAST BIOPSY Left 2017   CHOLECYSTECTOMY  2011   INDUCED ABORTION  1996   forced abortion   INGUINAL HERNIA REPAIR Left 1980   age 36   KNEE CLOSED REDUCTION Right 08/22/2021   Procedure: RIGHT  KNEE MANIPULATION  UNDER ANESTHESIA;  Surgeon: Meredith Pel, MD;  Location: [place];  Service: Orthopedics;  Laterality: Right;   LAPAROSCOPIC ENDOMETRIOSIS FULGURATION  1998   TOTAL KNEE ARTHROPLASTY Right 07/05/2021   Procedure: TOTAL KNEE ARTHROPLASTY;  Surgeon: Meredith Pel, MD;  Location: [place];  Service: Orthopedics;  Laterality: Right;   WISDOM TOOTH EXTRACTION  2007    There were no vitals filed for this visit.   Subjective Assessment - 09/14/21 1406     Subjective Pt reports that her knee has been getting better and better each day.    Pertinent History depression, PTSD,migraine/ TIA (loss of sensation on L side), L ischemic stroke, T2DM, irregular heartbeat, gastroparesis, IBS, asthma, GERD    Diagnostic tests X-ray    Patient Stated Goals normal function on knee, get it strainght and get it bent    Currently in Pain? Yes    Pain Score 2    at rest; 4 when doing bike   Pain Location Knee    Pain Orientation Right    Pain Descriptors / Indicators Aching;Tightness  Pain Type Acute pain;Surgical pain                               [facility] Adult PT Treatment/Exercise - 09/14/21 0001       Knee/Hip Exercises: Stretches   Gastroc Stretch Right;2 reps;30 seconds    Gastroc Stretch Limitations runner stretch      Knee/Hip Exercises: Aerobic   Stationary Bike full rev 5 min; L1x84mn      Knee/Hip Exercises: Standing   Lateral Step Up Right;2 sets;10 reps;Hand Hold: 1;Step Height: 6"    Forward Step Up 2 sets;10 reps;Step Height: 6";Hand Hold: 1;Right      Manual Therapy   Manual Therapy Joint mobilization;Soft tissue mobilization    Joint Mobilization TF AP/PA mobs grade III-IV    Soft tissue mobilization STM to R distal quads and HS, R gastroc                       PT Short Term Goals - 08/11/21 1518       PT SHORT TERM GOAL #1   Title Ind with initial HEP    Time 2    Period Weeks    Status Achieved    Target Date  08/16/21               PT Long Term Goals - 08/31/21 1044       PT LONG TERM GOAL #1   Title Ind with progressed HEP for LE strengthening/gait    Time 8    Period Weeks    Status On-going   met for current   Target Date 09/27/21      PT LONG TERM GOAL #2   Title Pt. will improve R knee extension to no more than lacking 5 deg full extension for safety with gait.    Baseline lacking 20 deg R knee extension.    Time 8    Period Weeks    Status On-going   10/13- lacking 7 degrees 10/25- lacking 10 deg  11/2- lacking 14 deg   Target Date 09/27/21      PT LONG TERM GOAL #3   Title Pt. will demonstrate 115 deg R knee flexion to be able to perform activities safely    Baseline 80 deg R knee flexion    Time 8    Period Weeks    Status On-going   10/13- 90 deg 10/25- 96 deg 08/31/21- 100 deg   Target Date 09/27/21      PT LONG TERM GOAL #4   Title Pt. will be able to ambulate 1000' without AD safely without limitations from pain to access community.    Baseline 2WRW, slow antalgic gait    Time 8    Period Weeks    Status On-going   [facility] able to ambulate without AD but antalgic   Target Date 09/27/21      PT LONG TERM GOAL #5   Title Patient will demonstrate improved functional LE strength by completing 5xSTS < 14 seconds.    Baseline 24 seconds.    Time 8    Period Weeks    Status On-going      PT LONG TERM GOAL #6   Title Pt. will report no more than 2/10 R knee pain with activity.    Baseline 5-8/10 depending on activity    Time 8    Period Weeks  Status On-going   10/25- 5/10 with bike                  Plan - 09/14/21 1514     Clinical Impression Statement Pt was reporting some increased anterior R knee pain with recumbent bike pre session. With STM being done and manual stretches patient noted a decrease in R knee pain and increased ability to complete full rev on recumbent bike. Pt does tend to compensation with step ups and provided  cues to avoid circumduction with step up. She is making progress with knee function.    Personal Factors and Comorbidities Comorbidity 3+    Comorbidities depression, PTSD, obesity, TIA/migraine, T2DM, hypertension, HT, gastroparesis, IBS, asthma, GERD, L knee OA, history ischemic stroke, heart irregularities    PT Frequency 2x / week    PT Duration 8 weeks    PT Treatment/Interventions ADLs/Self Care Home Management;Cryotherapy;Electrical Stimulation;Iontophoresis 2m/ml Dexamethasone;Moist Heat;Ultrasound;Gait training;Stair training;Functional mobility training;Therapeutic activities;Therapeutic exercise;Balance training;Neuromuscular re-education;Patient/family education;Manual techniques;Scar mobilization;Passive range of motion;Dry needling;Taping;Vasopneumatic Device;Joint Manipulations    PT Next Visit Plan STM to RF, lat quads, and post knee; progress R knee ROM and stregth as tolerated.    PT Home Exercise Plan Access Code: [email or web address]    [email or web address] Agree with Plan of Care Patient             Patient will benefit from skilled therapeutic intervention in order to improve the following deficits and impairments:  Abnormal gait, Decreased activity tolerance, Decreased endurance, Decreased range of motion, Decreased skin integrity, Decreased strength, Hypomobility, Increased fascial restricitons, Impaired sensation, Pain, Decreased mobility, Decreased scar mobility, Difficulty walking, Increased edema, Increased muscle spasms, Impaired flexibility  Visit Diagnosis: Acute pain of right knee  Stiffness of right knee, not elsewhere classified  Other abnormalities of gait and mobility  Muscle weakness (generalized)  Localized edema     Problem List Patient Active Problem List   Diagnosis Date Noted   Arthrofibrosis of knee joint, right    Arthritis of right knee    Diabetic gastroparesis ([place] 07/20/2021   Constipation 07/20/2021   S/P total knee arthroplasty, right 07/05/2021    Chronic diarrhea 04/29/2021   Belching 04/29/2021   Primary osteoarthritis of both knees 10/28/2020   Irritable bowel syndrome with diarrhea 10/16/2019   Tendinitis of right rotator cuff 09/17/2019   Hand pain, right 05/21/2019   Labral tear of hip joint 05/21/2019   Type 2 diabetes mellitus with diabetic neuropathy, unspecified ([place] 10/01/2018   Right hip pain 05/22/2018   DTennis MustQuervain's disease (radial styloid tenosynovitis) 04/02/2018   Carpal tunnel syndrome, right upper limb 12/31/2017   Elbow injury, right, initial encounter 12/16/2017   Chronic pain of both shoulders 11/30/2017   PTSD (post-traumatic stress disorder) 10/25/2016   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[place] 10/25/2016   Diabetes mellitus ([place] 10/25/2016   Major depressive disorder, recurrent episode, severe, with psychosis ([place] 10/20/2016   Major depressive disorder, recurrent episode, severe, 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[place] 10/19/2016   TIA (transient ischemic attack) 05/16/2016   Arterial ischemic stroke, MCA, left, acute ([place] 03/29/2016   Plantar fasciitis 08/08/2013   Obesity, Class III, BMI 40-49.9 (morbid obesity) ([place] 04/09/2013   Unspecified sleep apnea 03/31/2013   PCO (polycystic ovaries) 04/11/2012   Neck pain 11/21/2010   FATTY LIVER DISEASE 01/26/2010   NAUSEA WITH VOMITING 01/26/2010   PORTAL HYPERTENSION 01/26/2010   Migraine headache 06/28/2009   BREAST MASS 08/21/2008   KNEE PAIN 04/14/2008   Hypothyroidism 03/17/2008   Hyperlipidemia 03/17/2008  DEPRESSION 03/17/2008   NEUROPATHY 03/17/2008   Essential hypertension 03/17/2008   ASTHMA 03/17/2008   GERD 03/17/2008   Headache 03/17/2008    Artist Pais, PTA 09/14/2021, 3:19 PM  [facility] [address]  [place], [place], [address] Phone: [phone]   Fax:  [phone]  Name: Miranda [name] MRN: [id number] Date of Birth: [birth year]

## 2021-09-16 ENCOUNTER — Other Ambulatory Visit: Payer: Self-pay | Admitting: Family Medicine

## 2021-09-16 ENCOUNTER — Encounter: Payer: Self-pay | Admitting: Orthopedic Surgery

## 2021-09-16 NOTE — Progress Notes (Signed)
Post-Op Visit Note   Patient: Miranda [name]           Date of Birth: [birth year]           MRN: [id number] Visit Date: 08/31/2021 PCP: Vonita Moss, NP   Assessment & Plan:  Chief Complaint:  Chief Complaint  Patient presents with   Right Knee - Routine Post Op     08/22/21 (9d)  Right  Knee Manipulation Under Anesthesia - Right     Visit Diagnoses: No diagnosis found.  Plan: [name] is now a week out from right knee manipulation under anesthesia for postop arthrofibrosis.  She has been in a CPM machine since the surgery.  On exam she is struggling to get to 90 degrees.  Extension is intact.  Plan at this time is to really focus on constant range of motion exercises for the knee including going up and down stairs is much as possible.  No calf tenderness negative Homans today.  We were able to achieve a range of motion of about 120 at the time of manipulation but the knee has stiffened up more.  Come back in 2 to 4 weeks for clinical recheck.  Continue with therapy and use the CPM as much as patient can tolerate.  Follow-Up Instructions: No follow-ups on file.   Orders:  No orders of the defined types were placed in this encounter.  No orders of the defined types were placed in this encounter.   Imaging: No results found.  PMFS History: Patient Active Problem List   Diagnosis Date Noted   Arthrofibrosis of knee joint, right    Arthritis of right knee    Diabetic gastroparesis ([place]) 07/20/2021   Constipation 07/20/2021   S/P total knee arthroplasty, right 07/05/2021   Chronic diarrhea 04/29/2021   Belching 04/29/2021   Primary osteoarthritis of both knees 10/28/2020   Irritable bowel syndrome with diarrhea 10/16/2019   Tendinitis of right rotator cuff 09/17/2019   Hand pain, right 05/21/2019   Labral tear of hip joint 05/21/2019   Type 2 diabetes mellitus with diabetic neuropathy, unspecified ([place]) 10/01/2018   Right hip pain 05/22/2018   Tennis Must Quervain's disease  (radial styloid tenosynovitis) 04/02/2018   Carpal tunnel syndrome, right upper limb 12/31/2017   Elbow injury, right, initial encounter 12/16/2017   Chronic pain of both shoulders 11/30/2017   PTSD (post-traumatic stress disorder) 10/25/2016   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[place]) 10/25/2016   Diabetes mellitus ([place]) 10/25/2016   Major depressive disorder, recurrent episode, severe, with psychosis ([place]) 10/20/2016   Major depressive disorder, recurrent episode, severe, 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[place]) 10/19/2016   TIA (transient ischemic attack) 05/16/2016   Arterial ischemic stroke, MCA, left, acute ([place]) 03/29/2016   Plantar fasciitis 08/08/2013   Obesity, Class III, BMI 40-49.9 (morbid obesity) ([place]) 04/09/2013   Unspecified sleep apnea 03/31/2013   PCO (polycystic ovaries) 04/11/2012   Neck pain 11/21/2010   FATTY LIVER DISEASE 01/26/2010   NAUSEA WITH VOMITING 01/26/2010   PORTAL HYPERTENSION 01/26/2010   Migraine headache 06/28/2009   BREAST MASS 08/21/2008   KNEE PAIN 04/14/2008   Hypothyroidism 03/17/2008   Hyperlipidemia 03/17/2008   DEPRESSION 03/17/2008   NEUROPATHY 03/17/2008   Essential hypertension 03/17/2008   ASTHMA 03/17/2008   GERD 03/17/2008   Headache 03/17/2008   Past Medical History:  Diagnosis Date   Anginal pain ([facility])    Anxiety    Arthritis    Asthma    Bowel obstruction ([place])    Cardiac arrhythmia  Depression    sees Dr. Matilde Haymaker in [place]    Diabetes mellitus    sees Dr. Madelin Rear    Dyspnea    Dysrhythmia    GERD (gastroesophageal reflux disease)    Hyperlipidemia    Hypertension    Hypothyroidism    sees Dr. Elyse Hsu   Insomnia    Migraine syndrome    sees Dr[facility] Teod[facility]i at [facility]    Morbid obesity [facility])    Multiple personality ([place])    Neck pain    Neuropathy associated with endocrine disorder ([place])    Sleep apnea with use of continuous positive airway pressure (CPAP)    Sleep apnea is resolved due to weight  loss. 08/2017   Stroke ([place]) 03/25/2016   left MCA    Stroke ([place]) 04/2016   sees Dr. Teodora Medici at [place]  Adopted: Yes  Problem Relation Age of Onset   Heart disease Other        on both sides of family   Diabetes Other        on both sides of family    Past Surgical History:  Procedure Laterality Date   blocked itestinal repair  47   age 53 months   BREAST BIOPSY Left 2017   CHOLECYSTECTOMY  2011   INDUCED ABORTION  1996   forced abortion   INGUINAL HERNIA REPAIR Left 1980   age 36   KNEE CLOSED REDUCTION Right 08/22/2021   Procedure: RIGHT  KNEE MANIPULATION UNDER ANESTHESIA;  Surgeon: Meredith Pel, MD;  Location: [place];  Service: Orthopedics;  Laterality: Right;   LAPAROSCOPIC ENDOMETRIOSIS FULGURATION  1998   TOTAL KNEE ARTHROPLASTY Right 07/05/2021   Procedure: TOTAL KNEE ARTHROPLASTY;  Surgeon: Meredith Pel, MD;  Location: [place];  Service: Orthopedics;  Laterality: Right;   WISDOM TOOTH EXTRACTION  2007   Social History   Occupational History   Occupation: Nurse, learning disability  Tobacco Use   Smoking status: Every Day    Packs/day: 0.25    Years: 0.50    Pack years: 0.13    Types: Cigarettes    Start date: 02/24/2018   Smokeless tobacco: Never   Tobacco comments:    has decreased how much - trying to quit  Vaping Use   Vaping Use: Former   Substances: CBD  Substance and Sexual Activity   Alcohol use: Yes    Comment: rarely   Drug use: Yes    Types: Marijuana    Comment: 2/10 last marijuana use   Sexual activity: Not on file

## 2021-09-19 ENCOUNTER — Ambulatory Visit: Payer: BC Managed Care – PPO

## 2021-09-19 ENCOUNTER — Other Ambulatory Visit: Payer: Self-pay

## 2021-09-19 DIAGNOSIS — M25561 Pain in right knee: Secondary | ICD-10-CM

## 2021-09-19 DIAGNOSIS — R600 Localized edema: Secondary | ICD-10-CM

## 2021-09-19 DIAGNOSIS — M25661 Stiffness of right knee, not elsewhere classified: Secondary | ICD-10-CM

## 2021-09-19 DIAGNOSIS — R2689 Other abnormalities of gait and mobility: Secondary | ICD-10-CM

## 2021-09-19 DIAGNOSIS — M6281 Muscle weakness (generalized): Secondary | ICD-10-CM

## 2021-09-19 NOTE — Therapy (Signed)
[facility] [address]  [place], [place], [address] Phone: [phone]   Fax:  [phone]  Physical Therapy Treatment  Patient Details  Name: Miranda [name] MRN: [id number] Date of Birth: 1976-09-14 Referring Provider (PT): Marcene Duos   Encounter Date: 09/19/2021   PT End of Session - 09/19/21 1500     Visit Number 20    Number of Visits 30    Date for PT Re-Evaluation 09/27/21    Authorization Type BCBS    Authorization - Number of Visits 61    PT Start Time 1413   pt late   PT Stop Time 1446    PT Time Calculation (min) 33 min    Activity Tolerance Patient tolerated treatment well    Behavior During Therapy [facility] for tasks assessed/performed             Past Medical History:  Diagnosis Date   Anginal pain ([place])    Anxiety    Arthritis    Asthma    Bowel obstruction ([place])    Cardiac arrhythmia    Depression    sees Dr. Matilde Haymaker in [place]    Diabetes mellitus    sees Dr. Madelin Rear    Dyspnea    Dysrhythmia    GERD (gastroesophageal reflux disease)    Hyperlipidemia    Hypertension    Hypothyroidism    sees Dr. Elyse Hsu   Insomnia    Migraine syndrome    sees Dr. Teodora Medici at [facility]    Morbid obesity [facility])    Multiple personality ([place])    Neck pain    Neuropathy associated with endocrine disorder ([place])    Sleep apnea with use of continuous positive airway pressure (CPAP)    Sleep apnea is resolved due to weight loss. 08/2017   Stroke ([place]) 03/25/2016   left MCA    Stroke [facility]) 04/2016   sees Dr. Teodora Medici at [facility]     Past Surgical History:  Procedure Laterality Date   blocked itestinal repair  1975   age 14 months   BREAST BIOPSY Left 2017   CHOLECYSTECTOMY  2011   INDUCED ABORTION  1996   forced abortion   INGUINAL HERNIA REPAIR Left 1980   age 29   KNEE CLOSED REDUCTION Right 08/22/2021   Procedure: RIGHT  KNEE MANIPULATION UNDER  ANESTHESIA;  Surgeon: Meredith Pel, MD;  Location: [place];  Service: Orthopedics;  Laterality: Right;   LAPAROSCOPIC ENDOMETRIOSIS FULGURATION  1998   TOTAL KNEE ARTHROPLASTY Right 07/05/2021   Procedure: TOTAL KNEE ARTHROPLASTY;  Surgeon: Meredith Pel, MD;  Location: [place];  Service: Orthopedics;  Laterality: Right;   WISDOM TOOTH EXTRACTION  2007    There were no vitals filed for this visit.   Subjective Assessment - 09/19/21 1416     Subjective Pt reports that now she is able to sleep in bed more comfortable. The knee is getting stiffer.    Pertinent History depression, PTSD,migraine/ TIA (loss of sensation on L side), L ischemic stroke, T2DM, irregular heartbeat, gastroparesis, IBS, asthma, GERD    Diagnostic tests X-ray    Patient Stated Goals normal function on knee, get it strainght and get it bent    Currently in Pain? Yes    Pain Score 3     Pain Location Knee    Pain Orientation Right    Pain Descriptors / Indicators Aching;Tightness    Pain  Type Acute pain;Surgical pain                [facility] PT Assessment - 09/19/21 0001       AROM   Right Knee Extension 5    Right Knee Flexion 107   in sitting                          [facility] Adult PT Treatment/Exercise - 09/19/21 0001       Knee/Hip Exercises: Stretches   Gastroc Stretch Right;3 reps;30 seconds    Gastroc Stretch Limitations runner stretch      Knee/Hip Exercises: Aerobic   Stationary Bike L1x43mn      Knee/Hip Exercises: Standing   Forward Step Up Both;15 reps;Hand Hold: 1;Step Height: 4";Step Height: 6"    Forward Step Up Limitations 10 reps with 4', 5 reps with 6' step    Other Standing Knee Exercises retro step with opposite arm raise 20 reps      Knee/Hip Exercises: Seated   Long Arc Quad Strengthening;Both;10 reps    Long Arc Quad Limitations with isometric ball squeezes                       PT Short Term Goals - 08/11/21 1518        PT SHORT TERM GOAL #1   Title Ind with initial HEP    Time 2    Period Weeks    Status Achieved    Target Date 08/16/21               PT Long Term Goals - 09/19/21 1518       PT LONG TERM GOAL #1   Title Ind with progressed HEP for LE strengthening/gait    Time 8    Period Weeks    Status On-going   met for current     PT LONG TERM GOAL #2   Title Pt. will improve R knee extension to no more than lacking 5 deg full extension for safety with gait.    Baseline lacking 20 deg R knee extension.    Time 8    Period Weeks    Status Achieved   09/19/21     PT LONG TERM GOAL #3   Title Pt. will demonstrate 115 deg R knee flexion to be able to perform activities safely    Baseline 80 deg R knee flexion    Time 8    Period Weeks    Status On-going   10/13- 90 deg 10/25- 96 deg 08/31/21- 100 deg     PT LONG TERM GOAL #4   Title Pt. will be able to ambulate 1000' without AD safely without limitations from pain to access community.    Baseline 2WRW, slow antalgic gait    Time 8    Period Weeks    Status On-going   10/13- using SPC 11/2- able to ambulate without AD but antalgic     PT LONG TERM GOAL #5   Title Patient will demonstrate improved functional LE strength by completing 5xSTS < 14 seconds.    Baseline 24 seconds.    Time 8    Period Weeks    Status On-going      PT LONG TERM GOAL #6   Title Pt. will report no more than 2/10 R knee pain with activity.    Baseline 5-8/10 depending on activity    Time 8  Period Weeks    Status On-going   10/25- 5/10 with bike                  Plan - 09/19/21 1447     Clinical Impression Statement Session was shortened due to the patients late arrival. We worked on functional strengthening today and quad focused exercises for improved knee stability. She showed much more compensation with 6' step down and had c/o of L knee pain with steps. Post session pt able to improve R knee ROM 5-107 deg today, LTG 2 met.     Personal Factors and Comorbidities Comorbidity 3+    Comorbidities depression, PTSD, obesity, TIA/migraine, T2DM, hypertension, HT, gastroparesis, IBS, asthma, GERD, L knee OA, history ischemic stroke, heart irregularities    PT Frequency 2x / week    PT Duration 8 weeks    PT Treatment/Interventions ADLs/Self Care Home Management;Cryotherapy;Electrical Stimulation;Iontophoresis 83m/ml Dexamethasone;Moist Heat;Ultrasound;Gait training;Stair training;Functional mobility training;Therapeutic activities;Therapeutic exercise;Balance training;Neuromuscular re-education;Patient/family education;Manual techniques;Scar mobilization;Passive range of motion;Dry needling;Taping;Vasopneumatic Device;Joint Manipulations    PT Next Visit Plan STM to RF, lat quads, and post knee; progress R knee ROM and stregth as tolerated.    PT Home Exercise Plan Access Code: [email or web address]    [email or web address] Agree with Plan of Care Patient             Patient will benefit from skilled therapeutic intervention in order to improve the following deficits and impairments:  Abnormal gait, Decreased activity tolerance, Decreased endurance, Decreased range of motion, Decreased skin integrity, Decreased strength, Hypomobility, Increased fascial restricitons, Impaired sensation, Pain, Decreased mobility, Decreased scar mobility, Difficulty walking, Increased edema, Increased muscle spasms, Impaired flexibility  Visit Diagnosis: Acute pain of right knee  Stiffness of right knee, not elsewhere classified  Other abnormalities of gait and mobility  Muscle weakness (generalized)  Localized edema     Problem List Patient Active Problem List   Diagnosis Date Noted   Arthrofibrosis of knee joint, right    Arthritis of right knee    Diabetic gastroparesis ([place] 07/20/2021   Constipation 07/20/2021   S/P total knee arthroplasty, right 07/05/2021   Chronic diarrhea 04/29/2021   Belching 04/29/2021   Primary osteoarthritis of both  knees 10/28/2020   Irritable bowel syndrome with diarrhea 10/16/2019   Tendinitis of right rotator cuff 09/17/2019   Hand pain, right 05/21/2019   Labral tear of hip joint 05/21/2019   Type 2 diabetes mellitus with diabetic neuropathy, unspecified ([place] 10/01/2018   Right hip pain 05/22/2018   DTennis MustQuervain's disease (radial styloid tenosynovitis) 04/02/2018   Carpal tunnel syndrome, right upper limb 12/31/2017   Elbow injury, right, initial encounter 12/16/2017   Chronic pain of both shoulders 11/30/2017   PTSD (post-traumatic stress disorder) 10/25/2016   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 10/19/2016   TIA (transient ischemic attack) 05/16/2016   Arterial ischemic stroke, MCA, left, acute ([place] 03/29/2016   Plantar fasciitis 08/08/2013   Obesity, Class III, BMI 40-49.9 (morbid obesity) ([place] 04/09/2013   Unspecified sleep apnea 03/31/2013   PCO (polycystic ovaries) 04/11/2012   Neck pain 11/21/2010   FATTY LIVER DISEASE 01/26/2010   NAUSEA WITH VOMITING 01/26/2010   PORTAL HYPERTENSION 01/26/2010   Migraine headache 06/28/2009   BREAST MASS 08/21/2008   KNEE PAIN 04/14/2008   Hypothyroidism 03/17/2008   Hyperlipidemia 03/17/2008  DEPRESSION 03/17/2008   NEUROPATHY 03/17/2008   Essential hypertension 03/17/2008   ASTHMA 03/17/2008   GERD 03/17/2008   Headache 03/17/2008    Artist Pais, PTA 09/19/2021, 3:21 PM  [facility] [address]  [place], [place], [address] Phone: [phone]   Fax:  [phone]  Name: LEYANNA BITTMAN MRN: [id number] Date of Birth: 1976/06/08

## 2021-09-20 ENCOUNTER — Other Ambulatory Visit: Payer: Self-pay

## 2021-09-20 MED ORDER — GABAPENTIN 400 MG PO CAPS: 400.0000 mg | ORAL_CAPSULE | Freq: Three times a day (TID) | ORAL | 1 refills | Status: DC

## 2021-09-21 ENCOUNTER — Ambulatory Visit: Payer: BC Managed Care – PPO

## 2021-09-21 ENCOUNTER — Other Ambulatory Visit: Payer: Self-pay

## 2021-09-21 DIAGNOSIS — R2689 Other abnormalities of gait and mobility: Secondary | ICD-10-CM

## 2021-09-21 DIAGNOSIS — M25561 Pain in right knee: Secondary | ICD-10-CM | POA: Diagnosis not present

## 2021-09-21 DIAGNOSIS — M6281 Muscle weakness (generalized): Secondary | ICD-10-CM

## 2021-09-21 DIAGNOSIS — M25661 Stiffness of right knee, not elsewhere classified: Secondary | ICD-10-CM

## 2021-09-21 DIAGNOSIS — R600 Localized edema: Secondary | ICD-10-CM

## 2021-09-21 NOTE — Therapy (Signed)
[facility] [address]  [place], [place], [address] Phone: [phone]   Fax:  [phone]  Physical Therapy Treatment  Patient Details  Name: Miranda [name] MRN: [id number] Date of Birth: 1976/10/22 Referring Provider (PT): Marcene Duos   Encounter Date: 09/21/2021   PT End of Session - 09/21/21 1421     Visit Number 21    Number of Visits 30    Date for PT Re-Evaluation 09/27/21    Authorization Type BCBS    Authorization - Number of Visits 64    PT Start Time 1316    PT Stop Time 1402    PT Time Calculation (min) 46 min    Activity Tolerance Patient tolerated treatment well    Behavior During Therapy [facility] for tasks assessed/performed             Past Medical History:  Diagnosis Date   Anginal pain ([place])    Anxiety    Arthritis    Asthma    Bowel obstruction ([place])    Cardiac arrhythmia    Depression    sees Dr. Matilde Haymaker in [place]    Diabetes mellitus    sees Dr. Madelin Rear    Dyspnea    Dysrhythmia    GERD (gastroesophageal reflux disease)    Hyperlipidemia    Hypertension    Hypothyroidism    sees Dr. Elyse Hsu   Insomnia    Migraine syndrome    se[facility] Dr. Teodora Medici at [facility]    Morbid obesity [facility])    Multiple personality ([place])    Neck pain    Neuropathy associated with endocrine disorder ([place])    Sleep apnea with use of continuous positive airway pressure (CPAP)    Sleep apnea is resolved due to weight loss. 08/2017   Stroke ([place]) 03/25/2016   left MCA    Stroke [facility]) 04/2016   sees Dr. Teodora Medici at [facility]     Past Surgical History:  Procedure Laterality Date   blocked itestinal repair  1975   age 15 months   BREAST BIOPSY Left 2017   CHOLECYSTECTOMY  2011   INDUCED ABORTION  1996   forced abortion   INGUINAL HERNIA REPAIR Left 1980   age 62   KNEE CLOSED REDUCTION Right 08/22/2021   Procedure: RIGHT  KNEE MANIPULATION UNDER ANESTHESIA;   Surgeon: Meredith Pel, MD;  Location: [place];  Service: Orthopedics;  Laterality: Right;   LAPAROSCOPIC ENDOMETRIOSIS FULGURATION  1998   TOTAL KNEE ARTHROPLASTY Right 07/05/2021   Procedure: TOTAL KNEE ARTHROPLASTY;  Surgeon: Meredith Pel, MD;  Location: [place];  Service: Orthopedics;  Laterality: Right;   WISDOM TOOTH EXTRACTION  2007    There were no vitals filed for this visit.   Subjective Assessment - 09/21/21 1318     Subjective Pt reports her knee gets stiff in the night but seeing improvements.    Pertinent History depression, PTSD,migraine/ TIA (loss of sensation on L side), L ischemic stroke, T2DM, irregular heartbeat, gastroparesis, IBS, asthma, GERD    Diagnostic tests X-ray    Patient Stated Goals normal function on knee, get it strainght and get it bent    Currently in Pain? Yes    Pain Score 1     Pain Location Knee    Pain Orientation Right    Pain Descriptors / Indicators Aching;Tightness    Pain Type Acute pain;Surgical pain  [facility] PT Assessment - 09/21/21 0001       AROM   Right Knee Extension 4    Right Knee Flexion 105                           [facility] Adult PT Treatment/Exercise - 09/21/21 0001       Ambulation/Gait   Ambulation/Gait Yes    Ambulation/Gait Assistance 7: Independent    Ambulation Distance (Feet) 360 Feet    Assistive device None    Gait Pattern Step-through pattern;Decreased step length - left;Decreased stance time - right;Antalgic      Knee/Hip Exercises: Stretches   Passive Hamstring Stretch Right;3 reps;30 seconds    Passive Hamstring Stretch Limitations supine with strap    Hip Flexor Stretch Right;2 reps;30 seconds    Hip Flexor Stretch Limitations mod thomas + quad stretch with strap    Gastroc Stretch Right;3 reps;10 seconds    Gastroc Stretch Limitations supine passive stretch      Knee/Hip Exercises: Aerobic   Stationary Bike L1x69mn      Knee/Hip  Exercises: Standing   Functional Squat 2 sets;10 reps    Functional Squat Limitations with counter support    SLS golfers lifts with UE support 5 reps    Other Standing Knee Exercises fwd and back walks with red TB 3x along the counter with red TB      Manual Therapy   Manual Therapy Soft tissue mobilization    Soft tissue mobilization STM to R gastroc/soleus                       PT Short Term Goals - 08/11/21 1518       PT SHORT TERM GOAL #1   Title Ind with initial HEP    Time 2    Period Weeks    Status Achieved    Target Date 08/16/21               PT Long Term Goals - 09/19/21 1518       PT LONG TERM GOAL #1   Title Ind with progressed HEP for LE strengthening/gait    Time 8    Period Weeks    Status On-going   met for current     PT LONG TERM GOAL #2   Title Pt. will improve R knee extension to no more than lacking 5 deg full extension for safety with gait.    Baseline lacking 20 deg R knee extension.    Time 8    Period Weeks    Status Achieved   09/19/21     PT LONG TERM GOAL #3   Title Pt. will demonstrate 115 deg R knee flexion to be able to perform activities safely    Baseline 80 deg R knee flexion    Time 8    Period Weeks    Status On-going   10/13- 90 deg 10/25- 96 deg 08/31/21- 100 deg     PT LONG TERM GOAL #4   Title Pt. will be able to ambulate 1000' without AD safely without limitations from pain to access community.    Baseline 2WRW, slow antalgic gait    Time 8    Period Weeks    Status On-going   10/13- using SPC 11/2- able to ambulate without AD but antalgic     PT LONG TERM GOAL #5   Title Patient will demonstrate improved functional LE  strength by completing 5xSTS < 14 seconds.    Baseline 24 seconds.    Time 8    Period Weeks    Status On-going      PT LONG TERM GOAL #6   Title Pt. will report no more than 2/10 R knee pain with activity.    Baseline 5-8/10 depending on activity    Time 8    Period Weeks     Status On-going   10/25- 5/10 with bike                  Plan - 09/21/21 1421     Clinical Impression Statement Pt able to improve R knee ROM to 4-105 deg today in supine. Continued working some on functional LE strengthening, pt had good tolerance for the exercises today. Gait training focused on increasing heel strike and knee flexion with limb advancement. Finished session with STM to the R gastroc/soleus complex due to c/o tightness, improvements in tissue flexibility noted post session by patient.    Personal Factors and Comorbidities Comorbidity 3+    Comorbidities depression, PTSD, obesity, TIA/migraine, T2DM, hypertension, HT, gastroparesis, IBS, asthma, GERD, L knee OA, history ischemic stroke, heart irregularities    PT Frequency 2x / week    PT Duration 8 weeks    PT Treatment/Interventions ADLs/Self Care Home Management;Cryotherapy;Electrical Stimulation;Iontophoresis 23m/ml Dexamethasone;Moist Heat;Ultrasound;Gait training;Stair training;Functional mobility training;Therapeutic activities;Therapeutic exercise;Balance training;Neuromuscular re-education;Patient/family education;Manual techniques;Scar mobilization;Passive range of motion;Dry needling;Taping;Vasopneumatic Device;Joint Manipulations    PT Next Visit Plan keep working on R knee ROM and strength as tolerated.    PT Home Exercise Plan Access Code: [email or web address]    [email or web address] Agree with Plan of Care Patient             Patient will benefit from skilled therapeutic intervention in order to improve the following deficits and impairments:  Abnormal gait, Decreased activity tolerance, Decreased endurance, Decreased range of motion, Decreased skin integrity, Decreased strength, Hypomobility, Increased fascial restricitons, Impaired sensation, Pain, Decreased mobility, Decreased scar mobility, Difficulty walking, Increased edema, Increased muscle spasms, Impaired flexibility  Visit Diagnosis: Acute pain of right  knee  Stiffness of right knee, not elsewhere classified  Other abnormalities of gait and mobility  Muscle weakness (generalized)  Localized edema     Problem List Patient Active Problem List   Diagnosis Date Noted   Arthrofibrosis of knee joint, right    Arthritis of right knee    Diabetic gastroparesis ([place] 07/20/2021   Constipation 07/20/2021   S/P total knee arthroplasty, right 07/05/2021   Chronic diarrhea 04/29/2021   Belching 04/29/2021   Primary osteoarthritis of both knees 10/28/2020   Irritable bowel syndrome with diarrhea 10/16/2019   Tendinitis of right rotator cuff 09/17/2019   Hand pain, right 05/21/2019   Labral tear of hip joint 05/21/2019   Type 2 diabetes mellitus with diabetic neuropathy, unspecified ([place] 10/01/2018   Right hip pain 05/22/2018   DTennis MustQuervain's disease (radial styloid tenosynovitis) 04/02/2018   Carpal tunnel syndrome, right upper limb 12/31/2017   Elbow injury, right, initial encounter 12/16/2017   Chronic pain of both shoulders 11/30/2017   PTSD (post-traumatic stress disorder) 10/25/2016   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[place] 10/25/2016   Diabetes mellitus ([place] 10/25/2016   Major depressive disorder, recurrent episode, severe, with psychosis (HM[place] 10/20/2016   Major depressive disorder, recurrent episode, severe, 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HM[place] 10/19/2016   TIA (transient ischemic attack) 05/16/2016   Arterial ischemic stroke, MCA, left, acute (HLad[place] 03/29/2016   Plantar fasciitis 08/08/2013  Obesity, Class III, BMI 40-49.9 (morbid obesity) ([place]) 04/09/2013   Unspecified sleep apnea 03/31/2013   PCO (polycystic ovaries) 04/11/2012   Neck pain 11/21/2010   FATTY LIVER DISEASE 01/26/2010   NAUSEA WITH VOMITING 01/26/2010   PORTAL HYPERTENSION 01/26/2010   Migraine headache 06/28/2009   BREAST MASS 08/21/2008   KNEE PAIN 04/14/2008   Hypothyroidism 03/17/2008   Hyperlipidemia 03/17/2008   DEPRESSION 03/17/2008   NEUROPATHY  03/17/2008   Essential hypertension 03/17/2008   ASTHMA 03/17/2008   GERD 03/17/2008   Headache 03/17/2008    Artist Pais, PTA 09/21/2021, 2:27 PM  [facility] [address]  [place], [place], [address] Phone: [phone]   Fax:  [phone]  Name: Miranda [name] MRN: [id number] Date of Birth: [birth year]

## 2021-09-26 ENCOUNTER — Encounter: Payer: Self-pay | Admitting: Physical Therapy

## 2021-09-26 ENCOUNTER — Ambulatory Visit: Payer: BC Managed Care – PPO | Admitting: Physical Therapy

## 2021-09-26 ENCOUNTER — Other Ambulatory Visit: Payer: Self-pay

## 2021-09-26 DIAGNOSIS — M6281 Muscle weakness (generalized): Secondary | ICD-10-CM

## 2021-09-26 DIAGNOSIS — R2689 Other abnormalities of gait and mobility: Secondary | ICD-10-CM

## 2021-09-26 DIAGNOSIS — M25561 Pain in right knee: Secondary | ICD-10-CM

## 2021-09-26 DIAGNOSIS — M25661 Stiffness of right knee, not elsewhere classified: Secondary | ICD-10-CM

## 2021-09-26 DIAGNOSIS — R600 Localized edema: Secondary | ICD-10-CM

## 2021-09-26 NOTE — Patient Instructions (Signed)
[email or web address]

## 2021-09-26 NOTE — Therapy (Signed)
[facility] [address]  [place], [place], [address] Phone: [phone]   Fax:  [phone]  Physical Therapy Treatment/Progress Note  Progress Note Reporting Period 08/23/2021 to 09/26/2021  See note below for Objective Data and Assessment of Progress/Goals.     Patient Details  Name: Miranda [name] MRN: [id number] Date of Birth: 09-Feb-1976 Referring Provider (PT): Marcene Duos   Encounter Date: 09/26/2021   PT End of Session - 09/26/21 1323     Visit Number 22    Number of Visits 30    Date for PT Re-Evaluation 09/27/21    Authorization Type BCBS    Authorization - Number of Visits 57    PT Start Time 1316    PT Stop Time 1400    PT Time Calculation (min) 44 min    Activity Tolerance Patient tolerated treatment well    Behavior During Therapy WFL for tasks assessed/performed             Past Medical History:  Diagnosis Date   Anginal pain ([place])    Anxiety    Arthritis    Asthma    Bowel obstruction ([place])    Cardiac arrhythmia    Depression    sees Dr. Matilde Haymaker in [place]    Diabetes mellitus    sees Dr. Madelin Rear    Dyspnea    Dysrhythmia    GERD (gastroesophageal reflux disease)    Hyperlipidemia    Hypertension    Hypothyroidism    sees Dr. Elyse Hsu   Insomnia    Migraine syndrome    sees Dr. Teodora Medici at [facility]    Morbid obesity [facility])    Multiple personality ([place])    Neck pain    Neuropathy associated with endocrine disorder ([place])    Sleep apnea with use of continuous positive airway pressure (CPAP)    Sleep apnea is resolved due to weight loss. 08/2017   Stroke ([place]) 03/25/2016   left MCA    Stroke [facility]) 04/2016   sees Dr. Teodora Medici at [facility]     Past Surgical History:  Procedure Laterality Date   blocked itestinal repair  1975   age 109 months   BREAST BIOPSY Left 2017   CHOLECYSTECTOMY  2011   INDUCED ABORTION  1996   forced abortion    INGUINAL HERNIA REPAIR Left 1980   age 55   KNEE CLOSED REDUCTION Right 08/22/2021   Procedure: RIGHT  KNEE MANIPULATION UNDER ANESTHESIA;  Surgeon: Meredith Pel, MD;  Location: [place];  Service: Orthopedics;  Laterality: Right;   LAPAROSCOPIC ENDOMETRIOSIS FULGURATION  1998   TOTAL KNEE ARTHROPLASTY Right 07/05/2021   Procedure: TOTAL KNEE ARTHROPLASTY;  Surgeon: Meredith Pel, MD;  Location: [place];  Service: Orthopedics;  Laterality: Right;   WISDOM TOOTH EXTRACTION  2007    There were no vitals filed for this visit.   Subjective Assessment - 09/26/21 1321     Subjective Pt reports she had to stand a lot to prepare food for thanksgiving, knee was very stiff, but not hurting as much.  Still very tender to touch, having trouble with sheet touching her knee.    Pertinent History depression, PTSD,migraine/ TIA (loss of sensation on L side), L ischemic stroke, T2DM, irregular heartbeat, gastroparesis, IBS, asthma, GERD    Diagnostic tests X-ray    Patient Stated Goals normal function on knee, get it strainght and get it bent  Currently in Pain? Yes    Pain Score 2     Pain Location Knee    Pain Orientation Right                [facility] PT Assessment - 09/26/21 0001       Assessment   Medical Diagnosis Z98.890 (ICD-10-CM) - Post-operative state R TKA    Referring Provider (PT) Marcene Duos    Onset Date/Surgical Date 07/05/21    Hand Dominance Right    Prior Therapy yes      Precautions   Precautions None      Restrictions   Weight Bearing Restrictions No      AROM   Right Knee Extension 10    Right Knee Flexion 105                           [facility] Adult PT Treatment/Exercise - 09/26/21 0001       Self-Care   Self-Care Other Self-Care Comments    Other Self-Care Comments  desentiziation techniques and PNE education, demo with soft clothe, review scar tissue mobilizaiton.      Exercises   Exercises Knee/Hip       Knee/Hip Exercises: Stretches   Gastroc Stretch Right;3 reps;10 seconds    Gastroc Stretch Limitations standing      Knee/Hip Exercises: Aerobic   Nustep L4x70mn      Manual Therapy   Manual Therapy Joint mobilization;Soft tissue mobilization;Other (comment)    Manual therapy comments to improve R knee ROM    Joint Mobilization PA mobs tibia on femur with distraction and IR of Tibia to improve flexion    Soft tissue mobilization STM to R gastroc/soleus    Other Manual Therapy dry needling              Trigger Point Dry Needling - 09/26/21 0001     Consent Given? Yes    Education Handout Provided Previously provided    Muscles Treated Lower Quadrant Gastrocnemius;Hamstring;Peroneals   Right   Peroneals Response Twitch response elicited;Palpable increased muscle length    Hamstring Response Twitch response elicited;Palpable increased muscle length    Gastrocnemius Response Twitch response elicited;Palpable increased muscle length                   PT Education - 09/26/21 1823     Education Details Patient educated on home desensitization program, given handout from website.  Also educated on pain neuroscience, reviewing booklet "So your nerves are having a new replacement" while manual therapy performed.    Person(s) Educated Patient    Methods Explanation;Handout    Comprehension Verbalized understanding;Returned demonstration              PT Short Term Goals - 08/11/21 1518       PT SHORT TERM GOAL #1   Title Ind with initial HEP    Time 2    Period Weeks    Status Achieved    Target Date 08/16/21               PT Long Term Goals - 09/26/21 1329       PT LONG TERM GOAL #1   Title Ind with progressed HEP for LE strengthening/gait    Time 8    Period Weeks    Status On-going   met for current   Target Date 11/07/21      PT LONG TERM GOAL #2   Title Pt.  will improve R knee extension to no more than lacking 5 deg full extension for safety  with gait.    Baseline lacking 20 deg R knee extension.    Time 8    Period Weeks    Status Partially Met   09/19/21- lacking 4 deg 11/28- lacking 10 deg.   Target Date 11/07/21      PT LONG TERM GOAL #3   Title Pt. will demonstrate 115 deg R knee flexion to be able to perform activities safely    Baseline 80 deg R knee flexion    Time 8    Period Weeks    Status On-going   10/13- 90 deg 10/25- 96 deg 08/31/21- 100 deg  11/28- 105 deg.   Target Date 11/07/21      PT LONG TERM GOAL #4   Title Pt. will be able to ambulate 1000' without AD safely without limitations from pain to access community.    Baseline 2WRW, slow antalgic gait    Time 8    Period Weeks    Status On-going   10/13- using [facility] 11/2- able to ambulate without AD but antalgic  11/28- antalgic, visually slow gait speed, no AD   Target Date 11/07/21      PT LONG TERM GOAL #5   Title Patient will demonstrate improved functional LE strength by completing 5xSTS < 14 seconds.    Baseline 24 seconds.    Time 8    Period Weeks    Status On-going   09/26/21- will assess next session.   Target Date 11/07/21      PT LONG TERM GOAL #6   Title Pt. will report no more than 2/10 R knee pain with activity.    Baseline 5-8/10 depending on activity    Time 8    Period Weeks    Status On-going   10/25- 5/10 with bike  09/26/21- only 1-2/10 at rest, but still increases with activity.  Noted allodynia over R medial knee.   Target Date 11/07/21                   Plan - 09/26/21 1828     Clinical Impression Statement Focus of today's skilled therapy was on pain education and desensitization, as patient continues to demonstrate allodynia over her R knee, even light touch with soft clothe causing pain sensation, given desensitization protocol to start addressing and introduced to pain neuroscience.  At end of session she was able to flex knee to 105 deg with overpressure, but still very painful, and due to focus on flexion lost  extension to lacking 10 degrees today.  She did consent to dry needling again and focused today on peroneals as this area was very tight and she felt it was limiting R ankle DF stretches.  Tolerated well.  She would benefit from continued skilled therapy for additional 2x/week for 6 weeks to continue to progress strength, ROM and decrease pain in order to return to PLOF.    Personal Factors and Comorbidities Comorbidity 3+    Comorbidities depression, PTSD, obesity, TIA/migraine, T2DM, hypertension, HT, gastroparesis, IBS, asthma, GERD, L knee OA, history ischemic stroke, heart irregularities    PT Frequency 2x / week    PT Duration 6 weeks    PT Treatment/Interventions ADLs/Self Care Home Management;Cryotherapy;Electrical Stimulation;Iontophoresis 74m/ml Dexamethasone;Moist Heat;Ultrasound;Gait training;Stair training;Functional mobility training;Therapeutic activities;Therapeutic exercise;Balance training;Neuromuscular re-education;Patient/family education;Manual techniques;Scar mobilization;Passive range of motion;Dry needling;Taping;Vasopneumatic Device;Joint Manipulations    PT Next Visit Plan keep working on  R knee ROM and strength as tolerated.    PT Home Exercise Plan Access Code: [email or web address]    [email or web address] and Agree with Plan of Care Patient             Patient will benefit from skilled therapeutic intervention in order to improve the following deficits and impairments:  Abnormal gait, Decreased activity tolerance, Decreased endurance, Decreased range of motion, Decreased skin integrity, Decreased strength, Hypomobility, Increased fascial restricitons, Impaired sensation, Pain, Decreased mobility, Decreased scar mobility, Difficulty walking, Increased edema, Increased muscle spasms, Impaired flexibility  Visit Diagnosis: Acute pain of right knee  Stiffness of right knee, not elsewhere classified  Other abnormalities of gait and mobility  Muscle weakness (generalized)  Localized  edema     Problem List Patient Active Problem List   Diagnosis Date Noted   Arthrofibrosis of knee joint, right    Arthritis of right knee    Diabetic gastroparesis ([place]) 07/20/2021   Constipation 07/20/2021   S/P total knee arthroplasty, right 07/05/2021   Chronic diarrhea 04/29/2021   Belching 04/29/2021   Primary osteoarthritis of both knees 10/28/2020   Irritable bowel syndrome with diarrhea 10/16/2019   Tendinitis of right rotator cuff 09/17/2019   Hand pain, right 05/21/2019   Labral tear of hip joint 05/21/2019   Type 2 diabetes mellitus with diabetic neuropathy, unspecified ([place]) 10/01/2018   Right hip pain 05/22/2018   Tennis Must Quervain's disease (radial styloid tenosynovitis) 04/02/2018   Carpal tunnel syndrome, right upper limb 12/31/2017   Elbow injury, right, initial encounter 12/16/2017   Chronic pain of both shoulders 11/30/2017   PTSD (post-traumatic stress disorder) 10/25/2016   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[place]) 10/25/2016   Diabetes mellitus ([place]) 10/25/2016   Major depressive disorder, recurrent episode, severe, with psychosis ([place]) 10/20/2016   Major depressive disorder, recurrent episode, severe, 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[place]) 10/19/2016   TIA (transient ischemic attack) 05/16/2016   Arterial ischemic stroke, MCA, left, acute ([place]) 03/29/2016   Plantar fasciitis 08/08/2013   Obesity, Class III, BMI 40-49.9 (morbid obesity) ([place]) 04/09/2013   Unspecified sleep apnea 03/31/2013   PCO (polycystic ovaries) 04/11/2012   Neck pain 11/21/2010   FATTY LIVER DISEASE 01/26/2010   NAUSEA WITH VOMITING 01/26/2010   PORTAL HYPERTENSION 01/26/2010   Migraine headache 06/28/2009   BREAST MASS 08/21/2008   KNEE PAIN 04/14/2008   Hypothyroidism 03/17/2008   Hyperlipidemia 03/17/2008   DEPRESSION 03/17/2008   NEUROPATHY 03/17/2008   Essential hypertension 03/17/2008   ASTHMA 03/17/2008   GERD 03/17/2008   Headache 03/17/2008    Rennie Natter, PT, DPT   09/26/2021, 6:43 PM  [facility] [address]  [place], [place], [address] Phone: [phone]   Fax:  [phone]  Name: ABBEGALE STEHLE MRN: [id number] Date of Birth: [birth year]

## 2021-09-29 ENCOUNTER — Ambulatory Visit: Payer: BC Managed Care – PPO | Attending: Orthopedic Surgery

## 2021-09-29 ENCOUNTER — Other Ambulatory Visit: Payer: Self-pay

## 2021-09-29 DIAGNOSIS — M6281 Muscle weakness (generalized): Secondary | ICD-10-CM | POA: Insufficient documentation

## 2021-09-29 DIAGNOSIS — R2689 Other abnormalities of gait and mobility: Secondary | ICD-10-CM | POA: Insufficient documentation

## 2021-09-29 DIAGNOSIS — M25661 Stiffness of right knee, not elsewhere classified: Secondary | ICD-10-CM | POA: Insufficient documentation

## 2021-09-29 DIAGNOSIS — R600 Localized edema: Secondary | ICD-10-CM | POA: Diagnosis present

## 2021-09-29 DIAGNOSIS — M25561 Pain in right knee: Secondary | ICD-10-CM | POA: Insufficient documentation

## 2021-09-29 NOTE — Therapy (Signed)
[facility] [address]  [place], [place], [address] Phone: [phone]   Fax:  [phone]  Physical Therapy Treatment  Patient Details  Name: Miranda [name] MRN: [id number] Date of Birth: Jan 09, 1976 Referring Provider (PT): Marcene Duos   Encounter Date: 09/29/2021   PT End of Session - 09/29/21 1404     Visit Number 23    Number of Visits 30    Date for PT Re-Evaluation 11/07/21    Authorization Type BCBS    Authorization - Number of Visits 80    PT Start Time 1317    PT Stop Time 1359    PT Time Calculation (min) 42 min    Activity Tolerance Patient tolerated treatment well    Behavior During Therapy [facility] for tasks assessed/performed             Past Medical History:  Diagnosis Date   Anginal pain ([place])    Anxiety    Arthritis    Asthma    Bowel obstruction ([place])    Cardiac arrhythmia    Depression    sees Dr. Matilde Haymaker in [place]    Diabetes mellitus    sees Dr. Madelin Rear    Dyspnea    Dysrhythmia    GERD (gastroesophageal reflux disease)    Hyperlipidemia    Hypertension    Hypothyroidism    sees Dr. Elyse Hsu   Insomnia    Migraine syndrome    sees Dr. 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 08/2017   Stroke ([place]) 03/25/2016   left MCA    Stroke [facility]) 04/2016   se[facility] Teod[facility] Medici at [facility]     Past Surgical History:  Procedure Laterality Date   blocked itestinal repair  1975   age 40 months   BREAST BIOPSY Left 2017   CHOLECYSTECTOMY  2011   INDUCED ABORTION  1996   forced abortion   INGUINAL HERNIA REPAIR Left 1980   age 80   KNEE CLOSED REDUCTION Right 08/22/2021   Procedure: RIGHT  KNEE MANIPULATION UNDER ANESTHESIA;   Surgeon: Meredith Pel, MD;  Location: [place];  Service: Orthopedics;  Laterality: Right;   LAPAROSCOPIC ENDOMETRIOSIS FULGURATION  1998   TOTAL KNEE ARTHROPLASTY Right 07/05/2021   Procedure: TOTAL KNEE ARTHROPLASTY;  Surgeon: Meredith Pel, MD;  Location: [place];  Service: Orthopedics;  Laterality: Right;   WISDOM TOOTH EXTRACTION  2007    There were no vitals filed for this visit.   Subjective Assessment - 09/29/21 1321     Subjective Pt reports stiffness mostly in her knee, still having some mild difficulty with getting comfortable in bed.    Pertinent History depression, PTSD,migraine/ TIA (loss of sensation on L side), L ischemic stroke, T2DM, irregular heartbeat, gastroparesis, IBS, asthma, GERD    Diagnostic tests X-ray    Patient Stated Goals normal function on knee, get it strainght and get it bent    Currently in Pain? Yes    Pain Score 2     Pain Location Knee    Pain Orientation Right    Pain Descriptors / Indicators Aching;Tightness    Pain Type Acute pain;Surgical pain  [facility] Adult PT Treatment/Exercise - 09/29/21 0001       Exercises   Exercises Knee/Hip      Knee/Hip Exercises: Stretches   Passive Hamstring Stretch Right;2 reps;20 seconds    Passive Hamstring Stretch Limitations supine manual stretch    Quad Stretch Right;2 reps;30 seconds    Quad Stretch Limitations prone with strap    Gastroc Stretch Right;2 reps;30 seconds    Gastroc Stretch Limitations runner stretch      Knee/Hip Exercises: Aerobic   Stationary Bike L1x38mn      Knee/Hip Exercises: Standing   Terminal Knee Extension Strengthening;Right;10 reps    Theraband Level (Terminal Knee Extension) Level 3 (Green)    Other Standing Knee Exercises monster walks with red TB at knees 4x along the counter      Knee/Hip Exercises: Prone   Prone Knee Hang 2 minutes;Weights    Prone Knee Hang Weights (lbs) 1      Manual  Therapy   Manual Therapy Soft tissue mobilization;Myofascial release    Soft tissue mobilization STM to R lateral and mid HS    Myofascial Release TPR to lateral HS                       PT Short Term Goals - 08/11/21 1518       PT SHORT TERM GOAL #1   Title Ind with initial HEP    Time 2    Period Weeks    Status Achieved    Target Date 08/16/21               PT Long Term Goals - 09/26/21 1329       PT LONG TERM GOAL #1   Title Ind with progressed HEP for LE strengthening/gait    Time 8    Period Weeks    Status On-going   met for current   Target Date 11/07/21      PT LONG TERM GOAL #2   Title Pt. will improve R knee extension to no more than lacking 5 deg full extension for safety with gait.    Baseline lacking 20 deg R knee extension.    Time 8    Period Weeks    Status Partially Met   09/19/21- lacking 4 deg 11/28- lacking 10 deg.   Target Date 11/07/21      PT LONG TERM GOAL #3   Title Pt. will demonstrate 115 deg R knee flexion to be able to perform activities safely    Baseline 80 deg R knee flexion    Time 8    Period Weeks    Status On-going   10/13- 90 deg 10/25- 96 deg 08/31/21- 100 deg  11/28- 105 deg.   Target Date 11/07/21      PT LONG TERM GOAL #4   Title Pt. will be able to ambulate 1000' without AD safely without limitations from pain to access community.    Baseline 2WRW, slow antalgic gait    Time 8    Period Weeks    Status On-going   [facility] able to ambulate without AD but antalgic  11/28- antalgic, visually slow gait speed, no AD   Target Date 11/07/21      PT LONG TERM GOAL #5   Title Patient will demonstrate improved functional LE strength by completing 5xSTS < 14 seconds.    Baseline 24 seconds.    Time 8    Period Weeks  Status On-going   09/26/21- will assess next session.   Target Date 11/07/21      PT LONG TERM GOAL #6   Title Pt. will report no more than 2/10 R knee pain with activity.     Baseline 5-8/10 depending on activity    Time 8    Period Weeks    Status On-going   10/25- 5/10 with bike  09/26/21- only 1-2/10 at rest, but still increases with activity.  Noted allodynia over R medial knee.   Target Date 11/07/21                   Plan - 09/29/21 1404     Clinical Impression Statement Pt still with reports of stiffness in her knee. Still lacking full knee extension. MT focused on decreasing ongoing muscle tension in the lateral HS that could be restricting knee extension. Instructed her on prone knee hangs to do at home with small weight to help increase knee extension. We also worked on knee strengthening to help improve stability and propriception. Decreased speed and confidence noted with the monster walks, with cues she showed a better performance.    Personal Factors and Comorbidities Comorbidity 3+    Comorbidities depression, PTSD, obesity, TIA/migraine, T2DM, hypertension, HT, gastroparesis, IBS, asthma, GERD, L knee OA, history ischemic stroke, heart irregularities    PT Frequency 2x / week    PT Duration 8 weeks    PT Treatment/Interventions ADLs/Self Care Home Management;Cryotherapy;Electrical Stimulation;Iontophoresis 4mg /ml Dexamethasone;Moist Heat;Ultrasound;Gait training;Stair training;Functional mobility training;Therapeutic activities;Therapeutic exercise;Balance training;Neuromuscular re-education;Patient/family education;Manual techniques;Scar mobilization;Passive range of motion;Dry needling;Taping;Vasopneumatic Device;Joint Manipulations    PT Next Visit Plan keep working on R knee ROM and strength as tolerated.    PT Home Exercise Plan Access Code: [email or web address]    [email or web address] and Agree with Plan of Care Patient             Patient will benefit from skilled therapeutic intervention in order to improve the following deficits and impairments:  Abnormal gait, Decreased activity tolerance, Decreased endurance, Decreased range of motion, Decreased  skin integrity, Decreased strength, Hypomobility, Increased fascial restricitons, Impaired sensation, Pain, Decreased mobility, Decreased scar mobility, Difficulty walking, Increased edema, Increased muscle spasms, Impaired flexibility  Visit Diagnosis: Acute pain of right knee  Stiffness of right knee, not elsewhere classified  Other abnormalities of gait and mobility  Muscle weakness (generalized)  Localized edema     Problem List Patient Active Problem List   Diagnosis Date Noted   Arthrofibrosis of knee joint, right    Arthritis of right knee    Diabetic gastroparesis ([place]) 07/20/2021   Constipation 07/20/2021   S/P total knee arthroplasty, right 07/05/2021   Chronic diarrhea 04/29/2021   Belching 04/29/2021   Primary osteoarthritis of both knees 10/28/2020   Irritable bowel syndrome with diarrhea 10/16/2019   Tendinitis of right rotator cuff 09/17/2019   Hand pain, right 05/21/2019   Labral tear of hip joint 05/21/2019   Type 2 diabetes mellitus with diabetic neuropathy, unspecified ([place]) 10/01/2018   Right hip pain 05/22/2018   Tennis Must Quervain's disease (radial styloid tenosynovitis) 04/02/2018   Carpal tunnel syndrome, right upper limb 12/31/2017   Elbow injury, right, initial encounter 12/16/2017   Chronic pain of both shoulders 11/30/2017   PTSD (post-traumatic stress disorder) 10/25/2016   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[place]) 10/25/2016   Diabetes mellitus ([place]) 10/25/2016   Major depressive disorder, recurrent episode, severe, with psychosis ([place]) 10/20/2016   Major depressive disorder, recurrent episode, severe, 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  [place]) 10/19/2016   TIA (transient ischemic attack) 05/16/2016   Arterial ischemic stroke, MCA, left, acute ([place]) 03/29/2016   Plantar fasciitis 08/08/2013   Obesity, Class III, BMI 40-49.9 (morbid obesity) ([place]) 04/09/2013   Unspecified sleep apnea 03/31/2013   PCO (polycystic ovaries) 04/11/2012   Neck pain 11/21/2010   FATTY  LIVER DISEASE 01/26/2010   NAUSEA WITH VOMITING 01/26/2010   PORTAL HYPERTENSION 01/26/2010   Migraine headache 06/28/2009   BREAST MASS 08/21/2008   KNEE PAIN 04/14/2008   Hypothyroidism 03/17/2008   Hyperlipidemia 03/17/2008   DEPRESSION 03/17/2008   NEUROPATHY 03/17/2008   Essential hypertension 03/17/2008   ASTHMA 03/17/2008   GERD 03/17/2008   Headache 03/17/2008    Artist Pais, PTA 09/29/2021, 3:57 PM  [facility] [address]  [place], [place], [address] Phone: [phone]   Fax:  [phone]  Name: TVISHA SCHWOERER MRN: [id number] Date of Birth: 09/15/1976

## 2021-09-30 ENCOUNTER — Ambulatory Visit (INDEPENDENT_AMBULATORY_CARE_PROVIDER_SITE_OTHER): Payer: BC Managed Care – PPO | Admitting: Orthopedic Surgery

## 2021-09-30 ENCOUNTER — Encounter: Payer: Self-pay | Admitting: Orthopedic Surgery

## 2021-09-30 DIAGNOSIS — M1711 Unilateral primary osteoarthritis, right knee: Secondary | ICD-10-CM

## 2021-09-30 NOTE — Progress Notes (Signed)
Post-Op Visit Note   Patient: Miranda [name]           Date of Birth: Jan 30, 1976           MRN: [id number] Visit Date: 09/30/2021 PCP: Miranda Moss, NP   Assessment & Plan:  Chief Complaint:  Chief Complaint  Patient presents with   Other    08/22/21 Right knee MUA   Visit Diagnoses:  1. Unilateral primary osteoarthritis, right knee     Plan: Miranda [name] is a patient who is about a month out right knee manipulation under anesthesia.  Has some stiffness.  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  Baseline pain 2-3 out of 10.  Range of motion 0-1 07 max.  Overall she is better than she was prior to surgery.  Plan is to let her return to group home management December 6.  Continue to work on range of motion functionally by going up and down stairs is much as possible.  Follow-up with [place] as needed.  Follow-Up Instructions: Return if symptoms worsen or fail to improve.   Orders:  No orders of the defined types were placed in this encounter.  No orders of the defined types were placed in this encounter.   Imaging: No results found.  PMFS History: Patient Active Problem List   Diagnosis Date Noted   Arthrofibrosis of knee joint, right    Arthritis of right knee    Diabetic gastroparesis ([place]) 07/20/2021   Constipation 07/20/2021   S/P total knee arthroplasty, right 07/05/2021   Chronic diarrhea 04/29/2021   Belching 04/29/2021   Primary osteoarthritis of both knees 10/28/2020   Irritable bowel syndrome with diarrhea 10/16/2019   Tendinitis of right rotator cuff 09/17/2019   Hand pain, right 05/21/2019   Labral tear of hip joint 05/21/2019   Type 2 diabetes mellitus with diabetic neuropathy, unspecified ([place]) 10/01/2018   Right hip pain 05/22/2018   Tennis Must Quervain's disease (radial styloid tenosynovitis) 04/02/2018   Carpal tunnel syndrome, right upper limb 12/31/2017   Elbow injury, right, initial encounter 12/16/2017   Chronic pain of both shoulders  11/30/2017   PTSD (post-traumatic stress disorder) 10/25/2016   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[place]) 10/25/2016   Diabetes mellitus ([place]) 10/25/2016   Major depressive disorder, recurrent episode, severe, with psychosis ([place]) 10/20/2016   Major depressive disorder, recurrent episode, severe, 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[place]) 10/19/2016   TIA (transient ischemic attack) 05/16/2016   Arterial ischemic stroke, MCA, left, acute ([place]) 03/29/2016   Plantar fasciitis 08/08/2013   Obesity, Class III, BMI 40-49.9 (morbid obesity) ([place]) 04/09/2013   Unspecified sleep apnea 03/31/2013   PCO (polycystic ovaries) 04/11/2012   Neck pain 11/21/2010   FATTY LIVER DISEASE 01/26/2010   NAUSEA WITH VOMITING 01/26/2010   PORTAL HYPERTENSION 01/26/2010   Migraine headache 06/28/2009   BREAST MASS 08/21/2008   KNEE PAIN 04/14/2008   Hypothyroidism 03/17/2008   Hyperlipidemia 03/17/2008   DEPRESSION 03/17/2008   NEUROPATHY 03/17/2008   Essential hypertension 03/17/2008   ASTHMA 03/17/2008   GERD 03/17/2008   Headache 03/17/2008   Past Medical History:  Diagnosis Date   Anginal pain ([place])    Anxiety    Arthritis    Asthma    Bowel obstruction ([place])    Cardiac arrhythmia    Depression    sees Dr. Matilde Haymaker in [place]    Diabetes mellitus    sees Dr. Madelin Rear    Dyspnea    Dysrhythmia    GERD (  gastroesophageal reflux disease)    Hyperlipidemia    Hypertension    Hypothyroidism    sees Dr. Elyse Hsu   Insomnia    Migraine syndrome    se[facility] Teodora Medici at [facility]    Morbid obesity [facility])    Multiple personality [facility])    Neck pain    Neuropathy associated with endocrine disorder ([place])    Sleep apnea with use of continuous positive airway pressure (CPAP)    Sleep apnea is resolved due to weight loss. 08/2017   Stroke ([place]) 03/25/2016   left MCA    Stroke ([place]) 04/2016   sees Dr. Teodora Medici at [place]  Adopted: Yes  Problem Relation Age of Onset    Heart disease Other        on both sides of family   Diabetes Other        on both sides of family    Past Surgical History:  Procedure Laterality Date   blocked itestinal repair  69   age 61 months   BREAST BIOPSY Left 2017   CHOLECYSTECTOMY  2011   INDUCED ABORTION  1996   forced abortion   INGUINAL HERNIA REPAIR Left 1980   age 40   KNEE CLOSED REDUCTION Right 08/22/2021   Procedure: RIGHT  KNEE MANIPULATION UNDER ANESTHESIA;  Surgeon: Meredith Pel, MD;  Location: [place];  Service: Orthopedics;  Laterality: Right;   LAPAROSCOPIC ENDOMETRIOSIS FULGURATION  1998   TOTAL KNEE ARTHROPLASTY Right 07/05/2021   Procedure: TOTAL KNEE ARTHROPLASTY;  Surgeon: Meredith Pel, MD;  Location: [place];  Service: Orthopedics;  Laterality: Right;   WISDOM TOOTH EXTRACTION  2007   Social History   Occupational History   Occupation: Nurse, learning disability  Tobacco Use   Smoking status: Every Day    Packs/day: 0.25    Years: 0.50    Pack years: 0.13    Types: Cigarettes    Start date: 02/24/2018   Smokeless tobacco: Never   Tobacco comments:    has decreased how much - trying to quit  Vaping Use   Vaping Use: Former   Substances: CBD  Substance and Sexual Activity   Alcohol use: Yes    Comment: rarely   Drug use: Yes    Types: Marijuana    Comment: 2/10 last marijuana use   Sexual activity: Not on file

## 2021-10-03 ENCOUNTER — Other Ambulatory Visit: Payer: Self-pay

## 2021-10-03 ENCOUNTER — Ambulatory Visit: Payer: BC Managed Care – PPO

## 2021-10-03 DIAGNOSIS — M6281 Muscle weakness (generalized): Secondary | ICD-10-CM

## 2021-10-03 DIAGNOSIS — M25661 Stiffness of right knee, not elsewhere classified: Secondary | ICD-10-CM

## 2021-10-03 DIAGNOSIS — M25561 Pain in right knee: Secondary | ICD-10-CM | POA: Diagnosis not present

## 2021-10-03 DIAGNOSIS — R2689 Other abnormalities of gait and mobility: Secondary | ICD-10-CM

## 2021-10-03 DIAGNOSIS — R600 Localized edema: Secondary | ICD-10-CM

## 2021-10-03 NOTE — Therapy (Signed)
[facility] [address]  [place], [place], [address] Phone: [phone]   Fax:  [phone]  Physical Therapy Treatment  Patient Details  Name: Miranda [name] MRN: [id number] Date of Birth: March 31, 1976 Referring Provider (PT): Marcene Duos   Encounter Date: 10/03/2021   PT End of Session - 10/03/21 1511     Visit Number 24    Number of Visits 30    Date for PT Re-Evaluation 11/07/21    Authorization Type BCBS    Authorization - Number of Visits 45    PT Start Time 1321    PT Stop Time 1412    PT Time Calculation (min) 51 min    Activity Tolerance Patient tolerated treatment well;Patient limited by pain    Behavior During Therapy [facility] for tasks assessed/performed             Past Medical History:  Diagnosis Date   Anginal pain ([place])    Anxiety    Arthritis    Asthma    Bowel obstruction ([place])    Cardiac arrhythmia    Depression    sees Dr. Matilde Haymaker in [place]    Diabetes mellitus    sees Dr. Madelin Rear    Dyspnea    Dysrhythmia    GERD (gastroesophageal reflux disease)    Hyperlipidemia    Hypertension    Hypothyroidism    sees Dr. Elyse Hsu   Insomnia    Migraine syndrome    sees Dr. Teodora Medici at [facility]    Morbid obesity [facility])    Multiple personality ([place])    Neck pain    Neuropathy associated with endocrine disorder ([place])    Sleep apnea with use of continuous positive airway pressure (CPAP)    Sleep apnea is resolved due to weight loss. 08/2017   Stroke ([place]) 03/25/2016   left MCA    Stroke [facility]) 04/2016   sees Dr. Teodora Medici at [facility]     Past Surgical History:  Procedure Laterality Date   blocked itestinal repair  1975   age 56 months   BREAST BIOPSY Left 2017   CHOLECYSTECTOMY  2011   INDUCED ABORTION  1996   forced abortion   INGUINAL HERNIA REPAIR Left 1980   age 12   KNEE CLOSED REDUCTION Right 08/22/2021   Procedure: RIGHT  KNEE MANIPULATION  UNDER ANESTHESIA;  Surgeon: Meredith Pel, MD;  Location: [place];  Service: Orthopedics;  Laterality: Right;   LAPAROSCOPIC ENDOMETRIOSIS FULGURATION  1998   TOTAL KNEE ARTHROPLASTY Right 07/05/2021   Procedure: TOTAL KNEE ARTHROPLASTY;  Surgeon: Meredith Pel, MD;  Location: [place];  Service: Orthopedics;  Laterality: Right;   WISDOM TOOTH EXTRACTION  2007    There were no vitals filed for this visit.   Subjective Assessment - 10/03/21 1321     Subjective Pt reports that her knee is hurting a little more today. Still having trouble with doing steps.    Pertinent History depression, PTSD,migraine/ TIA (loss of sensation on L side), L ischemic stroke, T2DM, irregular heartbeat, gastroparesis, IBS, asthma, GERD    Diagnostic tests X-ray    Patient Stated Goals normal function on knee, get it strainght and get it bent    Currently in Pain? Yes    Pain Score 3     Pain Location Knee    Pain Orientation Right    Pain Descriptors / Indicators Aching;Tightness    Pain Type  Acute pain;Surgical pain                               [facility] Adult PT Treatment/Exercise - 10/03/21 0001       Knee/Hip Exercises: Stretches   Active Hamstring Stretch Right;2 reps;30 seconds    Active Hamstring Stretch Limitations seated with strap      Knee/Hip Exercises: Aerobic   Stationary Bike L1x58mn      Knee/Hip Exercises: Machines for Strengthening   Cybex Knee Extension 8lb R LE 12x      Knee/Hip Exercises: Standing   Forward Step Up Right;2 sets;10 reps;Hand Hold: 1;Step Height: 6"    Stairs 14 stairs x2 in stairwell, 8" steps   decreased quad control when descending stairs     Knee/Hip Exercises: Seated   Sit to Sand 10 reps;without UE support   slow eccentric control back to mat     Vasopneumatic   Number Minutes Vasopneumatic  10 minutes    Vasopnuematic Location  Knee    Vasopneumatic Pressure Low    Vasopneumatic Temperature  34                        PT Short Term Goals - 08/11/21 1518       PT SHORT TERM GOAL #1   Title Ind with initial HEP    Time 2    Period Weeks    Status Achieved    Target Date 08/16/21               PT Long Term Goals - 09/26/21 1329       PT LONG TERM GOAL #1   Title Ind with progressed HEP for LE strengthening/gait    Time 8    Period Weeks    Status On-going   met for current   Target Date 11/07/21      PT LONG TERM GOAL #2   Title Pt. will improve R knee extension to no more than lacking 5 deg full extension for safety with gait.    Baseline lacking 20 deg R knee extension.    Time 8    Period Weeks    Status Partially Met   09/19/21- lacking 4 deg 11/28- lacking 10 deg.   Target Date 11/07/21      PT LONG TERM GOAL #3   Title Pt. will demonstrate 115 deg R knee flexion to be able to perform activities safely    Baseline 80 deg R knee flexion    Time 8    Period Weeks    Status On-going   10/13- 90 deg 10/25- 96 deg 08/31/21- 100 deg  11/28- 105 deg.   Target Date 11/07/21      PT LONG TERM GOAL #4   Title Pt. will be able to ambulate 1000' without AD safely without limitations from pain to access community.    Baseline 2WRW, slow antalgic gait    Time 8    Period Weeks    Status On-going   [facility]- able to ambulate without AD but antalgic  11/28- antalgic, visually slow gait speed, no AD   Target Date 11/07/21      PT LONG TERM GOAL #5   Title Patient will demonstrate improved functional LE strength by completing 5xSTS < 14 seconds.    Baseline 24 seconds.    Time 8    Period Weeks  Status On-going   09/26/21- will assess next session.   Target Date 11/07/21      PT LONG TERM GOAL #6   Title Pt. will report no more than 2/10 R knee pain with activity.    Baseline 5-8/10 depending on activity    Time 8    Period Weeks    Status On-going   10/25- 5/10 with bike  09/26/21- only 1-2/10 at rest, but still increases with activity.  Noted  allodynia over R medial knee.   Target Date 11/07/21                   Plan - 10/03/21 1411     Clinical Impression Statement Pt shows continued stiff leg gait with decreased stance time on the R leg. She demonstrates decreased eccentric control of her quads with stairs, focused most interventions today on working on quad stability. Had to discontinue with machine HS curls due to lateral R knee pain. Tactile cues given with STS for increased WS to the R side. Ended session with GR to address soreness.    Personal Factors and Comorbidities Comorbidity 3+    Comorbidities depression, PTSD, obesity, TIA/migraine, T2DM, hypertension, HT, gastroparesis, IBS, asthma, GERD, L knee OA, history ischemic stroke, heart irregularities    PT Frequency 2x / week    PT Duration 8 weeks    PT Treatment/Interventions ADLs/Self Care Home Management;Cryotherapy;Electrical Stimulation;Iontophoresis 84m/ml Dexamethasone;Moist Heat;Ultrasound;Gait training;Stair training;Functional mobility training;Therapeutic activities;Therapeutic exercise;Balance training;Neuromuscular re-education;Patient/family education;Manual techniques;Scar mobilization;Passive range of motion;Dry needling;Taping;Vasopneumatic Device;Joint Manipulations    PT Next Visit Plan keep working on R knee ROM and strength as tolerated.    PT Home Exercise Plan Access Code: [email or web address]    [email or web address] Agree with Plan of Care Patient             Patient will benefit from skilled therapeutic intervention in order to improve the following deficits and impairments:  Abnormal gait, Decreased activity tolerance, Decreased endurance, Decreased range of motion, Decreased skin integrity, Decreased strength, Hypomobility, Increased fascial restricitons, Impaired sensation, Pain, Decreased mobility, Decreased scar mobility, Difficulty walking, Increased edema, Increased muscle spasms, Impaired flexibility  Visit Diagnosis: Acute pain of right  knee  Stiffness of right knee, not elsewhere classified  Other abnormalities of gait and mobility  Muscle weakness (generalized)  Localized edema     Problem List Patient Active Problem List   Diagnosis Date Noted   Arthrofibrosis of knee joint, right    Arthritis of right knee    Diabetic gastroparesis ([place] 07/20/2021   Constipation 07/20/2021   S/P total knee arthroplasty, right 07/05/2021   Chronic diarrhea 04/29/2021   Belching 04/29/2021   Primary osteoarthritis of both knees 10/28/2020   Irritable bowel syndrome with diarrhea 10/16/2019   Tendinitis of right rotator cuff 09/17/2019   Hand pain, right 05/21/2019   Labral tear of hip joint 05/21/2019   Type 2 diabetes mellitus with diabetic neuropathy, unspecified ([place] 10/01/2018   Right hip pain 05/22/2018   DTennis MustQuervain's disease (radial styloid tenosynovitis) 04/02/2018   Carpal tunnel syndrome, right upper limb 12/31/2017   Elbow injury, right, initial encounter 12/16/2017   Chronic pain of both shoulders 11/30/2017   PTSD (post-traumatic stress disorder) 10/25/2016   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[place] 10/25/2016   Diabetes mellitus ([place] 10/25/2016   Major depressive disorder, recurrent episode, severe, with psychosis ([place] 10/20/2016   Major depressive disorder, recurrent episode, severe, 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[place] 10/19/2016   TIA (transient ischemic attack) 05/16/2016   Arterial  ischemic stroke, MCA, left, acute ([place]) 03/29/2016   Plantar fasciitis 08/08/2013   Obesity, Class III, BMI 40-49.9 (morbid obesity) ([place]) 04/09/2013   Unspecified sleep apnea 03/31/2013   PCO (polycystic ovaries) 04/11/2012   Neck pain 11/21/2010   FATTY LIVER DISEASE 01/26/2010   NAUSEA WITH VOMITING 01/26/2010   PORTAL HYPERTENSION 01/26/2010   Migraine headache 06/28/2009   BREAST MASS 08/21/2008   KNEE PAIN 04/14/2008   Hypothyroidism 03/17/2008   Hyperlipidemia 03/17/2008   DEPRESSION 03/17/2008   NEUROPATHY  03/17/2008   Essential hypertension 03/17/2008   ASTHMA 03/17/2008   GERD 03/17/2008   Headache 03/17/2008    Artist Pais, PTA 10/03/2021, 3:12 PM  [facility] [address]  [place], [place], [address] Phone: [phone]   Fax:  [phone]  Name: Miranda [name] MRN: [id number] Date of Birth: 12-31-1975

## 2021-10-06 ENCOUNTER — Ambulatory Visit: Payer: BC Managed Care – PPO | Admitting: Physical Therapy

## 2021-10-06 ENCOUNTER — Other Ambulatory Visit: Payer: Self-pay

## 2021-10-06 ENCOUNTER — Encounter: Payer: Self-pay | Admitting: Physical Therapy

## 2021-10-06 DIAGNOSIS — M25561 Pain in right knee: Secondary | ICD-10-CM

## 2021-10-06 DIAGNOSIS — R2689 Other abnormalities of gait and mobility: Secondary | ICD-10-CM

## 2021-10-06 DIAGNOSIS — M25661 Stiffness of right knee, not elsewhere classified: Secondary | ICD-10-CM

## 2021-10-06 DIAGNOSIS — R600 Localized edema: Secondary | ICD-10-CM

## 2021-10-06 DIAGNOSIS — M6281 Muscle weakness (generalized): Secondary | ICD-10-CM

## 2021-10-06 NOTE — Therapy (Signed)
[facility] [address]  [place], [place], [address] Phone: [phone]   Fax:  [phone]  Physical Therapy Treatment  Patient Details  Name: Miranda [name] MRN: [id number] Date of Birth: 07/08/1976 Referring Provider (PT): Marcene Duos   Encounter Date: 10/06/2021   PT End of Session - 10/06/21 1327     Visit Number 25    Number of Visits 30    Date for PT Re-Evaluation 11/07/21    Authorization Type BCBS    Authorization - Number of Visits 1    PT Start Time 1315    PT Stop Time 1415    PT Time Calculation (min) 60 min    Activity Tolerance Patient tolerated treatment well;Patient limited by pain    Behavior During Therapy [facility] for tasks assessed/performed             Past Medical History:  Diagnosis Date   Anginal pain ([place])    Anxiety    Arthritis    Asthma    Bowel obstruction ([place])    Cardiac arrhythmia    Depression    sees Dr. Matilde Haymaker in [place]    Diabetes mellitus    sees Dr. Madelin Rear    Dyspnea    Dysrhythmia    GERD (gastroesophageal reflux disease)    Hyperlipidemia    Hypertension    Hypothyroidism    sees Dr. Elyse Hsu   Insomnia    Migraine syndrome    sees Dr. Teodora Medici at [facility]    Morbid obesity [facility])    Multiple personality ([place])    Neck pain    Neuropathy associated with endocrine disorder ([place])    Sleep apnea with use of continuous positive airway pressure (CPAP)    Sleep apnea is resolved due to weight loss. 08/2017   Stroke ([place]) 03/25/2016   left MCA    Stroke [facility]) 04/2016   sees Dr. Teodora Medici at [facility]     Past Surgical History:  Procedure Laterality Date   blocked itestinal repair  1975   age 84 months   BREAST BIOPSY Left 2017   CHOLECYSTECTOMY  2011   INDUCED ABORTION  1996   forced abortion   INGUINAL HERNIA REPAIR Left 1980   age 37   KNEE CLOSED REDUCTION Right 08/22/2021   Procedure: RIGHT  KNEE MANIPULATION  UNDER ANESTHESIA;  Surgeon: Meredith Pel, MD;  Location: [place];  Service: Orthopedics;  Laterality: Right;   LAPAROSCOPIC ENDOMETRIOSIS FULGURATION  1998   TOTAL KNEE ARTHROPLASTY Right 07/05/2021   Procedure: TOTAL KNEE ARTHROPLASTY;  Surgeon: Meredith Pel, MD;  Location: [place];  Service: Orthopedics;  Laterality: Right;   WISDOM TOOTH EXTRACTION  2007    There were no vitals filed for this visit.   Subjective Assessment - 10/06/21 1322     Subjective Pt reports that her knee is still hurting more on the lateral side and radiating down her leg.  Still having trouble with doing steps.    Pertinent History depression, PTSD,migraine/ TIA (loss of sensation on L side), L ischemic stroke, T2DM, irregular heartbeat, gastroparesis, IBS, asthma, GERD    Diagnostic tests X-ray    Patient Stated Goals normal function on knee, get it strainght and get it bent    Currently in Pain? Yes    Pain Score 5     Pain Location Knee    Pain Orientation Right  [place] Adult PT Treatment/Exercise - 10/06/21 0001       Exercises   Exercises Knee/Hip      Knee/Hip Exercises: Aerobic   Stationary Bike L1x37mn   hat to start backwards, but eventually able to perform full revolutions foward.     Knee/Hip Exercises: Standing   Knee Flexion AROM;Strengthening;10 reps    Lateral Step Up Right;2 sets;10 reps;Hand Hold: 2    Lateral Step Up Limitations on full size step    Forward Step Up Right;10 reps;Hand Hold: 1;Step Height: 6"    Forward Step Up Limitations progressed to stairs with 8" height.    Step Down Right;3 sets;5 reps    Step Down Limitations partial dips for eccentric control, 1 UE support.      Vasopneumatic   Number Minutes Vasopneumatic  10 minutes    Vasopnuematic Location  Knee    Vasopneumatic Pressure Low    Vasopneumatic Temperature  34      Manual Therapy   Manual Therapy Soft tissue  mobilization;Myofascial release    Manual therapy comments to improve R knee ROM    Soft tissue mobilization STM and IASTM with stainless steel tools  to R lateral knee and distal quad    Myofascial Release cross friction massage to lateral knee distal hamstring attachment.   Desensitization to incision.                       PT Short Term Goals - 08/11/21 1518       PT SHORT TERM GOAL #1   Title Ind with initial HEP    Time 2    Period Weeks    Status Achieved    Target Date 08/16/21               PT Long Term Goals - 09/26/21 1329       PT LONG TERM GOAL #1   Title Ind with progressed HEP for LE strengthening/gait    Time 8    Period Weeks    Status On-going   met for current   Target Date 11/07/21      PT LONG TERM GOAL #2   Title Pt. will improve R knee extension to no more than lacking 5 deg full extension for safety with gait.    Baseline lacking 20 deg R knee extension.    Time 8    Period Weeks    Status Partially Met   09/19/21- lacking 4 deg 11/28- lacking 10 deg.   Target Date 11/07/21      PT LONG TERM GOAL #3   Title Pt. will demonstrate 115 deg R knee flexion to be able to perform activities safely    Baseline 80 deg R knee flexion    Time 8    Period Weeks    Status On-going   10/13- 90 deg 10/25- 96 deg 08/31/21- 100 deg  11/28- 105 deg.   Target Date 11/07/21      PT LONG TERM GOAL #4   Title Pt. will be able to ambulate 1000' without AD safely without limitations from pain to access community.    Baseline 2WRW, slow antalgic gait    Time 8    Period Weeks    Status On-going   [facility] able to amb[facility] without AD but antalgic  11/28- antalgic, visually slow gait speed, no AD   Target Date 11/07/21      PT LONG TERM GOAL #5  Title Patient will demonstrate improved functional LE strength by completing 5xSTS < 14 seconds.    Baseline 24 seconds.    Time 8    Period Weeks    Status On-going   09/26/21- will assess  next session.   Target Date 11/07/21      PT LONG TERM GOAL #6   Title Pt. will report no more than 2/10 R knee pain with activity.    Baseline 5-8/10 depending on activity    Time 8    Period Weeks    Status On-going   10/25- 5/10 with bike  09/26/21- only 1-2/10 at rest, but still increases with activity.  Noted allodynia over R medial knee.   Target Date 11/07/21                   Plan - 10/06/21 1413     Clinical Impression Statement Riyanshi continues to report tightness and pain in lateral R knee.  She is able to step up easily on aerobic step, reports difficulty on stairs at home, so practices step ups in stairwell today and instructed to perform same exercises at home on bottom step with stairrail for support/safely.  She tolerated well but continued to have knee pain, so remainder of session of focused on manual therapy to lateral knee to decrease tightnss and restrictions.  She reported decreased pain and tightness following.  Game ready end of session to decrease post session soreness.    Personal Factors and Comorbidities Comorbidity 3+    Comorbidities depression, PTSD, obesity, TIA/migraine, T2DM, hypertension, HT, gastroparesis, IBS, asthma, GERD, L knee OA, history ischemic stroke, heart irregularities    PT Frequency 2x / week    PT Duration 8 weeks    PT Treatment/Interventions ADLs/Self Care Home Management;Cryotherapy;Electrical Stimulation;Iontophoresis 39m/ml Dexamethasone;Moist Heat;Ultrasound;Gait training;Stair training;Functional mobility training;Therapeutic activities;Therapeutic exercise;Balance training;Neuromuscular re-education;Patient/family education;Manual techniques;Scar mobilization;Passive range of motion;Dry needling;Taping;Vasopneumatic Device;Joint Manipulations    PT Next Visit Plan keep working on R knee ROM and strength as tolerated.    PT Home Exercise Plan Access Code: [email or web address]    [email or web address] Agree with Plan of Care Patient              Patient will benefit from skilled therapeutic intervention in order to improve the following deficits and impairments:  Abnormal gait, Decreased activity tolerance, Decreased endurance, Decreased range of motion, Decreased skin integrity, Decreased strength, Hypomobility, Increased fascial restricitons, Impaired sensation, Pain, Decreased mobility, Decreased scar mobility, Difficulty walking, Increased edema, Increased muscle spasms, Impaired flexibility  Visit Diagnosis: Acute pain of right knee  Stiffness of right knee, not elsewhere classified  Other abnormalities of gait and mobility  Muscle weakness (generalized)  Localized edema     Problem List Patient Active Problem List   Diagnosis Date Noted   Arthrofibrosis of knee joint, right    Arthritis of right knee    Diabetic gastroparesis ([place] 07/20/2021   Constipation 07/20/2021   S/P total knee arthroplasty, right 07/05/2021   Chronic diarrhea 04/29/2021   Belching 04/29/2021   Primary osteoarthritis of both knees 10/28/2020   Irritable bowel syndrome with diarrhea 10/16/2019   Tendinitis of right rotator cuff 09/17/2019   Hand pain, right 05/21/2019   Labral tear of hip joint 05/21/2019   Type 2 diabetes mellitus with diabetic neuropathy, unspecified ([place] 10/01/2018   Right hip pain 05/22/2018   DH[place]riet Phodisease (radial styloid tenosynovitis) 04/02/2018   Carpal tunnel syndrome, right upper limb 12/31/2017   Elbow injury, right, initial encounter  12/16/2017   Chronic pain of both shoulders 11/30/2017   PTSD (post-traumatic stress disorder) 10/25/2016   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[place]) 10/25/2016   Diabetes mellitus ([place]) 10/25/2016   Major depressive disorder, recurrent episode, severe, with psychosis ([place]) 10/20/2016   Major depressive disorder, recurrent episode, severe, 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[place]) 10/19/2016   TIA (transient ischemic attack) 05/16/2016   Arterial ischemic stroke, MCA, left, acute  ([place]) 03/29/2016   Plantar fasciitis 08/08/2013   Obesity, Class III, BMI 40-49.9 (morbid obesity) ([place]) 04/09/2013   Unspecified sleep apnea 03/31/2013   PCO (polycystic ovaries) 04/11/2012   Neck pain 11/21/2010   FATTY LIVER DISEASE 01/26/2010   NAUSEA WITH VOMITING 01/26/2010   PORTAL HYPERTENSION 01/26/2010   Migraine headache 06/28/2009   BREAST MASS 08/21/2008   KNEE PAIN 04/14/2008   Hypothyroidism 03/17/2008   Hyperlipidemia 03/17/2008   DEPRESSION 03/17/2008   NEUROPATHY 03/17/2008   Essential hypertension 03/17/2008   ASTHMA 03/17/2008   GERD 03/17/2008   Headache 03/17/2008    Rennie Natter, PT, DPT  10/06/2021, 2:25 PM  [facility] [address]  [place], [place], [address] Phone: [phone]   Fax:  [phone]  Name: Miranda [name] MRN: [id number] Date of Birth: 02-Sep-1976

## 2021-10-10 ENCOUNTER — Other Ambulatory Visit: Payer: Self-pay

## 2021-10-10 ENCOUNTER — Ambulatory Visit: Payer: BC Managed Care – PPO | Admitting: Physical Therapy

## 2021-10-10 ENCOUNTER — Encounter: Payer: Self-pay | Admitting: Physical Therapy

## 2021-10-10 DIAGNOSIS — M25661 Stiffness of right knee, not elsewhere classified: Secondary | ICD-10-CM

## 2021-10-10 DIAGNOSIS — M25561 Pain in right knee: Secondary | ICD-10-CM

## 2021-10-10 DIAGNOSIS — R2689 Other abnormalities of gait and mobility: Secondary | ICD-10-CM

## 2021-10-10 DIAGNOSIS — M6281 Muscle weakness (generalized): Secondary | ICD-10-CM

## 2021-10-10 DIAGNOSIS — R600 Localized edema: Secondary | ICD-10-CM

## 2021-10-10 NOTE — Therapy (Signed)
[facility] [address]  [place], [place], [address] Phone: [phone]   Fax:  [phone]  Physical Therapy Treatment  Patient Details  Name: Miranda [name] MRN: [id number] Date of Birth: 29-Nov-1975 Referring Provider (PT): Marcene Duos   Encounter Date: 10/10/2021   PT End of Session - 10/10/21 1320     Visit Number 26    Number of Visits 30    Date for PT Re-Evaluation 11/07/21    Authorization Type BCBS    Authorization - Number of Visits 64    PT Start Time 1316    PT Stop Time 1410    PT Time Calculation (min) 54 min    Activity Tolerance Patient tolerated treatment well;Patient limited by pain    Behavior During Therapy [facility] for tasks assessed/performed             Past Medical History:  Diagnosis Date   Anginal pain ([place])    Anxiety    Arthritis    Asthma    Bowel obstruction ([place])    Cardiac arrhythmia    Depression    sees Dr. Matilde Haymaker in [place]    Diabetes mellitus    sees Dr. Madelin Rear    Dyspnea    Dysrhythmia    GERD (gastroesophageal reflux disease)    Hyperlipidemia    Hypertension    Hypothyroidism    sees Dr. Elyse Hsu   Insomnia    Migraine syndrome    se[facility] 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 08/2017   Stroke ([place]) 03/25/2016   left MCA    Stroke [facility]) 04/2016   [facility] at [facility]     Past Surgical History:  Procedure Laterality Date   blocked itestinal repair  1975   age 18 months   BREAST BIOPSY Left 2017   CHOLECYSTECTOMY  2011   INDUCED ABORTION  1996   forced abortion   INGUINAL HERNIA REPAIR Left 1980   age 39   KNEE CLOSED REDUCTION Right 08/22/2021   Procedure: RIGHT  KNEE MANIPULATION  UNDER ANESTHESIA;  Surgeon: Meredith Pel, MD;  Location: [place];  Service: Orthopedics;  Laterality: Right;   LAPAROSCOPIC ENDOMETRIOSIS FULGURATION  1998   TOTAL KNEE ARTHROPLASTY Right 07/05/2021   Procedure: TOTAL KNEE ARTHROPLASTY;  Surgeon: Meredith Pel, MD;  Location: [place];  Service: Orthopedics;  Laterality: Right;   WISDOM TOOTH EXTRACTION  2007    There were no vitals filed for this visit.   Subjective Assessment - 10/10/21 1319     Subjective Patient reports her knee has been doing a lot better since last visit.  Still pain but not as bad.  More stiff than painful.    Pertinent History depression, PTSD,migraine/ TIA (loss of sensation on L side), L ischemic stroke, T2DM, irregular heartbeat, gastroparesis, IBS, asthma, GERD    Diagnostic tests X-ray    Patient Stated Goals normal function on knee, get it strainght and get it bent    Currently in Pain? Yes    Pain Score 2     Pain Location Knee    Pain Orientation Right    Pain Descriptors /  Indicators Tightness                [facility] PT Assessment - 10/10/21 0001       AROM   Right Knee Extension 5    Right Knee Flexion 105                           [facility] Adult PT Treatment/Exercise - 10/10/21 0001       Ambulation/Gait   Stairs Yes    Stairs Assistance 6: Modified independent (Device/Increase time)    Stair Management Technique One rail Right    Number of Stairs 12    Height of Stairs 8    Gait Comments significant improvement in eccentric control with descent, reports more pain in L knee than R      Exercises   Exercises Knee/Hip      Knee/Hip Exercises: Aerobic   Stationary Bike L1 x 6 min   full revolutions from start, able to maintain speed     Knee/Hip Exercises: Standing   Knee Flexion AROM;Strengthening;10 reps    Lateral Step Up Right;2 sets;10 reps;Hand Hold: 2    Lateral Step Up Limitations on full size step    Forward Step Up Right;10  reps;Hand Hold: 1;Step Height: 6"    Forward Step Up Limitations progressed to stairs with 8" height.    Step Down Right;3 sets;5 reps    Step Down Limitations partial dips for eccentric control, 1 UE support.    Stairs 14 stairs x2 in stairwell, 8" steps      Vasopneumatic   Number Minutes Vasopneumatic  10 minutes    Vasopnuematic Location  Knee    Vasopneumatic Pressure Low    Vasopneumatic Temperature  34      Manual Therapy   Manual Therapy Soft tissue mobilization;Myofascial release;Joint mobilization    Manual therapy comments to improve R knee ROM    Joint Mobilization gentle overpress with ER of tibia to improve extension, patellar glides.    Soft tissue mobilization STM and IASTM with stainless steel tools  to R lateral knee and distal quad    Myofascial Release cross friction massage to lateral knee distal hamstring attachment.   Desensitization to incision.                       PT Short Term Goals - 08/11/21 1518       PT SHORT TERM GOAL #1   Title Ind with initial HEP    Time 2    Period Weeks    Status Achieved    Target Date 08/16/21               PT Long Term Goals - 09/26/21 1329       PT LONG TERM GOAL #1   Title Ind with progressed HEP for LE strengthening/gait    Time 8    Period Weeks    Status On-going   met for current   Target Date 11/07/21      PT LONG TERM GOAL #2   Title Pt. will improve R knee extension to no more than lacking 5 deg full extension for safety with gait.    Baseline lacking 20 deg R knee extension.    Time 8    Period Weeks    Status Partially Met   09/19/21- lacking 4 deg 11/28- lacking 10 deg.   Target Date 11/07/21  PT LONG TERM GOAL #3   Title Pt. will demonstrate 115 deg R knee flexion to be able to perform activities safely    Baseline 80 deg R knee flexion    Time 8    Period Weeks    Status On-going   10/13- 90 deg 10/25- 96 deg 08/31/21- 100 deg  11/28- 105 deg.   Target Date 11/07/21       PT LONG TERM GOAL #4   Title Pt. will be able to ambulate 1000' without AD safely without limitations from pain to access community.    Baseline 2WRW, slow antalgic gait    Time 8    Period Weeks    Status On-going   10/13- using [facility] 11/2- able to ambulate without AD but antalgic  11/28- antalgic, visually slow gait speed, no AD   Target Date 11/07/21      PT LONG TERM GOAL #5   Title Patient will demonstrate improved functional LE strength by completing 5xSTS < 14 seconds.    Baseline 24 seconds.    Time 8    Period Weeks    Status On-going   09/26/21- will assess next session.   Target Date 11/07/21      PT LONG TERM GOAL #6   Title Pt. will report no more than 2/10 R knee pain with activity.    Baseline 5-8/10 depending on activity    Time 8    Period Weeks    Status On-going   10/25- 5/10 with bike  09/26/21- only 1-2/10 at rest, but still increases with activity.  Noted allodynia over R medial knee.   Target Date 11/07/21                   Plan - 10/10/21 1321     Clinical Impression Statement Niana demonstrated significant improvement in function today, able to ascen/descend stairs with better control and less pain, and also able to use full ROM of R knee (which was 5-105) without less complaint or pain or stiffness.  She is finding L knee pain more limiting than her R knee pain now.  Manual therapy continued to focus on R knee extensio, progressed to 5 deg today.  Noted less sensitivity as well in knee.  Game ready at end to decrease post session soreness.  She would benefit from continued skilled therapy.    Personal Factors and Comorbidities Comorbidity 3+    Comorbidities depression, PTSD, obesity, TIA/migraine, T2DM, hypertension, HT, gastroparesis, IBS, asthma, GERD, L knee OA, history ischemic stroke, heart irregularities    PT Frequency 2x / week    PT Duration 8 weeks    PT Treatment/Interventions ADLs/Self Care Home Management;Cryotherapy;Electrical  Stimulation;Iontophoresis 71m/ml Dexamethasone;Moist Heat;Ultrasound;Gait training;Stair training;Functional mobility training;Therapeutic activities;Therapeutic exercise;Balance training;Neuromuscular re-education;Patient/family education;Manual techniques;Scar mobilization;Passive range of motion;Dry needling;Taping;Vasopneumatic Device;Joint Manipulations    PT Next Visit Plan keep working on R knee ROM and strength as tolerated.    PT Home Exercise Plan Access Code: [email or web address]    [email or web address] Agree with Plan of Care Patient             Patient will benefit from skilled therapeutic intervention in order to improve the following deficits and impairments:  Abnormal gait, Decreased activity tolerance, Decreased endurance, Decreased range of motion, Decreased skin integrity, Decreased strength, Hypomobility, Increased fascial restricitons, Impaired sensation, Pain, Decreased mobility, Decreased scar mobility, Difficulty walking, Increased edema, Increased muscle spasms, Impaired flexibility  Visit Diagnosis: Acute pain of right knee  Stiffness of right  knee, not elsewhere classified  Other abnormalities of gait and mobility  Muscle weakness (generalized)  Localized edema     Problem List Patient Active Problem List   Diagnosis Date Noted   Arthrofibrosis of knee joint, right    Arthritis of right knee    Diabetic gastroparesis ([place]) 07/20/2021   Constipation 07/20/2021   S/P total knee arthroplasty, right 07/05/2021   Chronic diarrhea 04/29/2021   Belching 04/29/2021   Primary osteoarthritis of both knees 10/28/2020   Irritable bowel syndrome with diarrhea 10/16/2019   Tendinitis of right rotator cuff 09/17/2019   Hand pain, right 05/21/2019   Labral tear of hip joint 05/21/2019   Type 2 diabetes mellitus with diabetic neuropathy, unspecified ([place]) 10/01/2018   Right hip pain 05/22/2018   [place] Must Quervain's disease (radial styloid tenosynovitis) 04/02/2018   Carpal tunnel  syndrome, right upper limb 12/31/2017   Elbow injury, right, initial encounter 12/16/2017   Chronic pain of both shoulders 11/30/2017   PTSD (post-traumatic stress disorder) 10/25/2016   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[place]) 10/25/2016   Diabetes mellitus ([place]) 10/25/2016   Major depressive disorder, recurrent episode, severe, with psychosis ([place]) 10/20/2016   Major depressive disorder, recurrent episode, severe, 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[place]) 10/19/2016   TIA (transient ischemic attack) 05/16/2016   Arterial ischemic stroke, MCA, left, acute ([place]) 03/29/2016   Plantar fasciitis 08/08/2013   Obesity, Class III, BMI 40-49.9 (morbid obesity) ([place]) 04/09/2013   Unspecified sleep apnea 03/31/2013   PCO (polycystic ovaries) 04/11/2012   Neck pain 11/21/2010   FATTY LIVER DISEASE 01/26/2010   NAUSEA WITH VOMITING 01/26/2010   PORTAL HYPERTENSION 01/26/2010   Migraine headache 06/28/2009   BREAST MASS 08/21/2008   KNEE PAIN 04/14/2008   Hypothyroidism 03/17/2008   Hyperlipidemia 03/17/2008   DEPRESSION 03/17/2008   NEUROPATHY 03/17/2008   Essential hypertension 03/17/2008   ASTHMA 03/17/2008   GERD 03/17/2008   Headache 03/17/2008    Rennie Natter, PT, DPT  10/10/2021, 5:56 PM  [facility] [address]  [place], [place], [address] Phone: [phone]   Fax:  [phone]  Name: KAMDEN REBER MRN: [id number] Date of Birth: January 04, 1976

## 2021-10-13 ENCOUNTER — Other Ambulatory Visit: Payer: Self-pay

## 2021-10-13 ENCOUNTER — Ambulatory Visit: Payer: BC Managed Care – PPO | Admitting: Physical Therapy

## 2021-10-13 ENCOUNTER — Encounter: Payer: Self-pay | Admitting: Physical Therapy

## 2021-10-13 DIAGNOSIS — R2689 Other abnormalities of gait and mobility: Secondary | ICD-10-CM

## 2021-10-13 DIAGNOSIS — M6281 Muscle weakness (generalized): Secondary | ICD-10-CM

## 2021-10-13 DIAGNOSIS — M25561 Pain in right knee: Secondary | ICD-10-CM

## 2021-10-13 DIAGNOSIS — R600 Localized edema: Secondary | ICD-10-CM

## 2021-10-13 DIAGNOSIS — M25661 Stiffness of right knee, not elsewhere classified: Secondary | ICD-10-CM

## 2021-10-13 NOTE — Therapy (Signed)
[facility] [address]  [place], [place], [address] Phone: [phone]   Fax:  [phone]  Physical Therapy Treatment  Patient Details  Name: Miranda [name] MRN: [id number] Date of Birth: Apr 05, 1976 Referring Provider (PT): Marcene Duos   Encounter Date: 10/13/2021   PT End of Session - 10/13/21 1152     Visit Number 27    Number of Visits 30    Date for PT Re-Evaluation 11/07/21    Authorization Type BCBS    Progress Note Due on Visit 10    PT Start Time 1150    PT Stop Time 1238    PT Time Calculation (min) 48 min    Activity Tolerance Patient tolerated treatment well;Patient limited by pain    Behavior During Therapy [facility] for tasks assessed/performed             Past Medical History:  Diagnosis Date   Anginal pain ([place])    Anxiety    Arthritis    Asthma    Bowel obstruction ([place])    Cardiac arrhythmia    Depression    sees Dr. Matilde Haymaker in [place]    Diabetes mellitus    sees Dr. Madelin Rear    Dyspnea    Dysrhythmia    GERD (gastroesophageal reflux disease)    Hyperlipidemia    Hypertension    Hypothyroidism    sees Dr. Elyse Hsu   Insomnia    Migraine syndrome    sees Dr. 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 08/2017   Stroke ([place]) 03/25/2016   left MCA    Stroke [facility]) 04/2016   sees Dr. Teodora Medici at [facility]     Past Surgical History:  Procedure Laterality Date   blocked itestinal repair  1975   age 38 months   BREAST BIOPSY Left 2017   CHOLECYSTECTOMY  2011   INDUCED ABORTION  1996   forced abortion   INGUINAL HERNIA REPAIR Left 1980   age 69   KNEE CLOSED REDUCTION Right 08/22/2021   Procedure: RIGHT  KNEE MANIPULATION UNDER  ANESTHESIA;  Surgeon: Meredith Pel, MD;  Location: [facility];  Service: Orthopedics;  Laterality: Right;   LAPAROSCOPIC ENDOMETRIOSIS FULGURATION  1998   TOTAL KNEE ARTHROPLASTY Right 07/05/2021   Procedure: TOTAL KNEE ARTHROPLASTY;  Surgeon: Meredith Pel, MD;  Location: [facility];  Service: Orthopedics;  Laterality: Right;   WISDOM TOOTH EXTRACTION  2007    There were no vitals filed for this visit.   Subjective Assessment - 10/13/21 1154     Subjective Patient doing well today. Yesterday was bad. I had to take Ibuprofen which helped.    Pertinent History depression, PTSD,migraine/ TIA (loss of sensation on L side), L ischemic stroke, T2DM, irregular heartbeat, gastroparesis, IBS, asthma, GERD    Patient Stated Goals normal function on knee, get it strainght and get it bent    Currently in Pain? Yes    Pain Score 2     Pain Location Knee    Pain Orientation Right    Pain Descriptors / Indicators Tightness    Pain Type Surgical pain  [facility] PT Assessment - 10/13/21 0001       AROM   Right Knee Flexion 108                           [facility] Adult PT Treatment/Exercise - 10/13/21 0001       Knee/Hip Exercises: Stretches   Gastroc Stretch Right;2 reps;30 seconds    Gastroc Stretch Limitations on incline of UBE      Knee/Hip Exercises: Aerobic   Stationary Bike L1 x 6 min   full revolutions from start, able to maintain speed     Knee/Hip Exercises: Standing   Lateral Step Up Right;1 set;10 reps;Step Height: 8";Hand Hold: 1    Forward Step Up Right;10 reps;Hand Hold: 1;Step Height: 8"    Step Down Right;Hand Hold: 1;1 set;10 reps;Step Height: 4"    Step Down Limitations focused on bwd step up: also heel taps on 4" and 2" step x 5 ea    Stairs 14 stairs x 1 in stairwell, 8" steps   attempted bwd step up but too high     Modalities   Modalities Vasopneumatic      Vasopneumatic   Number Minutes Vasopneumatic  10  minutes    Vasopnuematic Location  Knee    Vasopneumatic Pressure Low    Vasopneumatic Temperature  34      Manual Therapy   Manual Therapy Passive ROM    Passive ROM into ext 3x 10 sec hold; into flexion with prolonged holds                       PT Short Term Goals - 08/11/21 1518       PT SHORT TERM GOAL #1   Title Ind with initial HEP    Time 2    Period Weeks    Status Achieved    Target Date 08/16/21               PT Long Term Goals - 09/26/21 1329       PT LONG TERM GOAL #1   Title Ind with progressed HEP for LE strengthening/gait    Time 8    Period Weeks    Status On-going   met for current   Target Date 11/07/21      PT LONG TERM GOAL #2   Title Pt. will improve R knee extension to no more than lacking 5 deg full extension for safety with gait.    Baseline lacking 20 deg R knee extension.    Time 8    Period Weeks    Status Partially Met   09/19/21- lacking 4 deg 11/28- lacking 10 deg.   Target Date 11/07/21      PT LONG TERM GOAL #3   Title Pt. will demonstrate 115 deg R knee flexion to be able to perform activities safely    Baseline 80 deg R knee flexion    Time 8    Period Weeks    Status On-going   10/13- 90 deg 10/25- 96 deg 08/31/21- 100 deg  11/28- 105 deg.   Target Date 11/07/21      PT LONG TERM GOAL #4   Title Pt. will be able to ambulate 1000' without AD safely without limitations from pain to access community.    Baseline 2WRW, slow antalgic gait    Time 8    Period Weeks    Status On-going   10/13- using  [facility] 11/2- able to ambulate without AD but antalgic  11/28- antalgic, visually slow gait speed, no AD   Target Date 11/07/21      PT LONG TERM GOAL #5   Title Patient will demonstrate improved functional LE strength by completing 5xSTS < 14 seconds.    Baseline 24 seconds.    Time 8    Period Weeks    Status On-going   09/26/21- will assess next session.   Target Date 11/07/21      PT LONG TERM GOAL #6   Title  Pt. will report no more than 2/10 R knee pain with activity.    Baseline 5-8/10 depending on activity    Time 8    Period Weeks    Status On-going   10/25- 5/10 with bike  09/26/21- only 1-2/10 at rest, but still increases with activity.  Noted allodynia over R medial knee.   Target Date 11/07/21                   Plan - 10/13/21 1234     Clinical Impression Statement We continued to work on eccentric strengthening today. Pt still demonstrating poor control at end range descent from 8 inch step. Continuing with slow gains in flexion as well to 108 today.    Comorbidities depression, PTSD, obesity, TIA/migraine, T2DM, hypertension, HT, gastroparesis, IBS, asthma, GERD, L knee OA, history ischemic stroke, heart irregularities    PT Treatment/Interventions ADLs/Self Care Home Management;Cryotherapy;Electrical Stimulation;Iontophoresis 35m/ml Dexamethasone;Moist Heat;Ultrasound;Gait training;Stair training;Functional mobility training;Therapeutic activities;Therapeutic exercise;Balance training;Neuromuscular re-education;Patient/family education;Manual techniques;Scar mobilization;Passive range of motion;Dry needling;Taping;Vasopneumatic Device;Joint Manipulations    PT Next Visit Plan keep working on R knee ROM and strength as tolerated.             Patient will benefit from skilled therapeutic intervention in order to improve the following deficits and impairments:  Abnormal gait, Decreased activity tolerance, Decreased endurance, Decreased range of motion, Decreased skin integrity, Decreased strength, Hypomobility, Increased fascial restricitons, Impaired sensation, Pain, Decreased mobility, Decreased scar mobility, Difficulty walking, Increased edema, Increased muscle spasms, Impaired flexibility  Visit Diagnosis: Acute pain of right knee  Stiffness of right knee, not elsewhere classified  Other abnormalities of gait and mobility  Muscle weakness (generalized)  Localized  edema     Problem List Patient Active Problem List   Diagnosis Date Noted   Arthrofibrosis of knee joint, right    Arthritis of right knee    Diabetic gastroparesis ([place] 07/20/2021   Constipation 07/20/2021   S/P total knee arthroplasty, right 07/05/2021   Chronic diarrhea 04/29/2021   Belching 04/29/2021   Primary osteoarthritis of both knees 10/28/2020   Irritable bowel syndrome with diarrhea 10/16/2019   Tendinitis of right rotator cuff 09/17/2019   Hand pain, right 05/21/2019   Labral tear of hip joint 05/21/2019   Type 2 diabetes mellitus with diabetic neuropathy, unspecified ([place] 10/01/2018   Right hip pain 05/22/2018   DTennis MustQuervain's disease (radial styloid tenosynovitis) 04/02/2018   Carpal tunnel syndrome, right upper limb 12/31/2017   Elbow injury, right, initial encounter 12/16/2017   Chronic pain of both shoulders 11/30/2017   PTSD (post-traumatic stress disorder) 10/25/2016   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[place] 10/25/2016   Diabetes mellitus ([place] 10/25/2016   Major depressive disorder, recurrent episode, severe, with psychosis ([place] 10/20/2016   Major depressive disorder, recurrent episode, severe, 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[place] 10/19/2016   TIA (transient ischemic attack) 05/16/2016   Arterial ischemic stroke, MCA, left, acute ([place] 03/29/2016   Plantar  fasciitis 08/08/2013   Obesity, Class III, BMI 40-49.9 (morbid obesity) ([place]) 04/09/2013   Unspecified sleep apnea 03/31/2013   PCO (polycystic ovaries) 04/11/2012   Neck pain 11/21/2010   FATTY LIVER DISEASE 01/26/2010   NAUSEA WITH VOMITING 01/26/2010   PORTAL HYPERTENSION 01/26/2010   Migraine headache 06/28/2009   BREAST MASS 08/21/2008   KNEE PAIN 04/14/2008   Hypothyroidism 03/17/2008   Hyperlipidemia 03/17/2008   DEPRESSION 03/17/2008   NEUROPATHY 03/17/2008   Essential hypertension 03/17/2008   ASTHMA 03/17/2008   GERD 03/17/2008   Headache 03/17/2008   Madelyn Flavors, PT 10/13/2021, 12:37  PM  [facility] [address]  [place], [place], [address] Phone: [phone]   Fax:  [phone]  Name: RAECHELLE SARTI MRN: [id number] Date of Birth: 1976-01-13

## 2021-10-18 ENCOUNTER — Other Ambulatory Visit: Payer: Self-pay

## 2021-10-18 ENCOUNTER — Ambulatory Visit: Payer: BC Managed Care – PPO | Admitting: Physical Therapy

## 2021-10-18 ENCOUNTER — Encounter: Payer: Self-pay | Admitting: Physical Therapy

## 2021-10-18 DIAGNOSIS — M6281 Muscle weakness (generalized): Secondary | ICD-10-CM

## 2021-10-18 DIAGNOSIS — M25561 Pain in right knee: Secondary | ICD-10-CM | POA: Diagnosis not present

## 2021-10-18 DIAGNOSIS — R600 Localized edema: Secondary | ICD-10-CM

## 2021-10-18 DIAGNOSIS — R2689 Other abnormalities of gait and mobility: Secondary | ICD-10-CM

## 2021-10-18 DIAGNOSIS — M25661 Stiffness of right knee, not elsewhere classified: Secondary | ICD-10-CM

## 2021-10-18 NOTE — Therapy (Signed)
[facility] [address]  [place], [place], [address] Phone: [phone]   Fax:  [phone]  Physical Therapy Treatment  Patient Details  Name: Miranda [name] MRN: [id number] Date of Birth: 1975/12/27 Referring Provider (PT): Marcene Duos   Encounter Date: 10/18/2021   PT End of Session - 10/18/21 0854     Visit Number 28    Number of Visits 30    Date for PT Re-Evaluation 11/07/21    Authorization Type BCBS    Progress Note Due on Visit 10    PT Start Time 0850    PT Stop Time 0930    PT Time Calculation (min) 40 min    Activity Tolerance Patient tolerated treatment well;Patient limited by pain    Behavior During Therapy [facility] for tasks assessed/performed             Past Medical History:  Diagnosis Date   Anginal pain ([place])    Anxiety    Arthritis    Asthma    Bowel obstruction ([place])    Cardiac arrhythmia    Depression    sees Dr. Matilde Haymaker in [place]    Diabetes mellitus    sees Dr. Madelin Rear    Dyspnea    Dysrhythmia    GERD (gastroesophageal reflux disease)    Hyperlipidemia    Hypertension    Hypothyroidism    sees Dr. Elyse Hsu   Insomnia    Migraine syndrome    sees Dr. Teod[facility] Medici at [facility]    Morbid obesity [facility])    Multiple personality ([place])    Neck pain    Neuropathy associated with endocrine disorder ([place])    Sleep apnea with use of continuous positive airway pressure (CPAP)    Sleep apnea is resolved due to weight loss. 08/2017   Stroke ([place]) 03/25/2016   left MCA    Stroke [facility]) 04/2016   sees Dr[facility] Teodora Medici at [facility]     Past Surgical History:  Procedure Laterality Date   blocked itestinal repair  1975   age 31 months   BREAST BIOPSY Left 2017   CHOLECYSTECTOMY  2011   INDUCED ABORTION  1996   forced abortion   INGUINAL HERNIA REPAIR Left 1980   age 67   KNEE CLOSED REDUCTION Right 08/22/2021   Procedure: RIGHT  KNEE MANIPULATION UNDER  ANESTHESIA;  Surgeon: Meredith Pel, MD;  Location: [place];  Service: Orthopedics;  Laterality: Right;   LAPAROSCOPIC ENDOMETRIOSIS FULGURATION  1998   TOTAL KNEE ARTHROPLASTY Right 07/05/2021   Procedure: TOTAL KNEE ARTHROPLASTY;  Surgeon: Meredith Pel, MD;  Location: [facility];  Service: Orthopedics;  Laterality: Right;   WISDOM TOOTH EXTRACTION  2007    There were no vitals filed for this visit.   Subjective Assessment - 10/18/21 0852     Subjective Knee is doing well as long as takes ibuprofen 2x/day.  No real pain today, just a little achey.    Pertinent History depression, PTSD,migraine/ TIA (loss of sensation on L side), L ischemic stroke, T2DM, irregular heartbeat, gastroparesis, IBS, asthma, GERD    Patient Stated Goals normal function on knee, get it strainght and get it bent    Currently in Pain? No/denies                               [facility] Adult PT Treatment/Exercise - 10/18/21  0001       Knee/Hip Exercises: Aerobic   Nustep L5 x 6 min      Knee/Hip Exercises: Machines for Strengthening   Cybex Knee Extension 25# 2 x 10    Cybex Knee Flexion 25# 2 x 10   some pain lateral R knee     Knee/Hip Exercises: Standing   Lateral Step Up Right;2 sets;10 reps;Hand Hold: 1;Step Height: 8"    Forward Step Up Right;2 sets;10 reps;Hand Hold: 1;Step Height: 8"    Step Down Right;2 sets;10 reps;Hand Hold: 1;Step Height: 2"    Step Down Limitations dips for eccentric control      Manual Therapy   Manual Therapy Soft tissue mobilization    Soft tissue mobilization STM and IASTM with stainless steel tools  to R peroneals                       PT Short Term Goals - 08/11/21 1518       PT SHORT TERM GOAL #1   Title Ind with initial HEP    Time 2    Period Weeks    Status Achieved    Target Date 08/16/21               PT Long Term Goals - 09/26/21 1329       PT LONG TERM GOAL #1   Title Ind with progressed  HEP for LE strengthening/gait    Time 8    Period Weeks    Status On-going   met for current   Target Date 11/07/21      PT LONG TERM GOAL #2   Title Pt. will improve R knee extension to no more than lacking 5 deg full extension for safety with gait.    Baseline lacking 20 deg R knee extension.    Time 8    Period Weeks    Status Partially Met   09/19/21- lacking 4 deg 11/28- lacking 10 deg.   Target Date 11/07/21      PT LONG TERM GOAL #3   Title Pt. will demonstrate 115 deg R knee flexion to be able to perform activities safely    Baseline 80 deg R knee flexion    Time 8    Period Weeks    Status On-going   10/13- 90 deg 10/25- 96 deg 08/31/21- 100 deg  11/28- 105 deg.   Target Date 11/07/21      PT LONG TERM GOAL #4   Title Pt. will be able to ambulate 1000' without AD safely without limitations from pain to access community.    Baseline 2WRW, slow antalgic gait    Time 8    Period Weeks    Status On-going   10/13- using [facility] 11/2- able to ambulate without AD but antalgic  11/28- antalgic, visually slow gait speed, no AD   Target Date 11/07/21      PT LONG TERM GOAL #5   Title Patient will demonstrate improved functional LE strength by completing 5xSTS < 14 seconds.    Baseline 24 seconds.    Time 8    Period Weeks    Status On-going   09/26/21- will assess next session.   Target Date 11/07/21      PT LONG TERM GOAL #6   Title Pt. will report no more than 2/10 R knee pain with activity.    Baseline 5-8/10 depending on activity    Time 8    Period  Weeks    Status On-going   10/25- 5/10 with bike  09/26/21- only 1-2/10 at rest, but still increases with activity.  Noted allodynia over R medial knee.   Target Date 11/07/21                   Plan - 10/18/21 0935     Clinical Impression Statement Continued to focus on quad strengthening today, still challenged with R eccentric quad control.  Pt. also reporting tightness along peroneals, especially with HS curls  today.  Manual therapy to R peroneals, reported decreased tightness following.  Her extension also continues to improve, only lacking 3 deg today.    Comorbidities depression, PTSD, obesity, TIA/migraine, T2DM, hypertension, HT, gastroparesis, IBS, asthma, GERD, L knee OA, history ischemic stroke, heart irregularities    PT Treatment/Interventions ADLs/Self Care Home Management;Cryotherapy;Electrical Stimulation;Iontophoresis 46m/ml Dexamethasone;Moist Heat;Ultrasound;Gait training;Stair training;Functional mobility training;Therapeutic activities;Therapeutic exercise;Balance training;Neuromuscular re-education;Patient/family education;Manual techniques;Scar mobilization;Passive range of motion;Dry needling;Taping;Vasopneumatic Device;Joint Manipulations    PT Next Visit Plan keep working on R knee ROM and strength as tolerated.             Patient will benefit from skilled therapeutic intervention in order to improve the following deficits and impairments:  Abnormal gait, Decreased activity tolerance, Decreased endurance, Decreased range of motion, Decreased skin integrity, Decreased strength, Hypomobility, Increased fascial restricitons, Impaired sensation, Pain, Decreased mobility, Decreased scar mobility, Difficulty walking, Increased edema, Increased muscle spasms, Impaired flexibility  Visit Diagnosis: Acute pain of right knee  Stiffness of right knee, not elsewhere classified  Other abnormalities of gait and mobility  Muscle weakness (generalized)  Localized edema     Problem List Patient Active Problem List   Diagnosis Date Noted   Arthrofibrosis of knee joint, right    Arthritis of right knee    Diabetic gastroparesis ([place] 07/20/2021   Constipation 07/20/2021   S/P total knee arthroplasty, right 07/05/2021   Chronic diarrhea 04/29/2021   Belching 04/29/2021   Primary osteoarthritis of both knees 10/28/2020   Irritable bowel syndrome with diarrhea 10/16/2019    Tendinitis of right rotator cuff 09/17/2019   Hand pain, right 05/21/2019   Labral tear of hip joint 05/21/2019   Type 2 diabetes mellitus with diabetic neuropathy, unspecified ([place] 10/01/2018   Right hip pain 05/22/2018   DTennis MustQuervain's disease (radial styloid tenosynovitis) 04/02/2018   Carpal tunnel syndrome, right upper limb 12/31/2017   Elbow injury, right, initial encounter 12/16/2017   Chronic pain of both shoulders 11/30/2017   PTSD (post-traumatic stress disorder) 10/25/2016   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[place] 10/25/2016   Diabetes mellitus ([place] 10/25/2016   Major depressive disorder, recurrent episode, severe, with psychosis (H[place] 10/20/2016   Major depressive disorder, recurrent episode, severe, 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[place] 10/19/2016   TIA (transient ischemic attack) 05/16/2016   Arterial ischemic stroke, MCA, left, acute (H[place] 03/29/2016   Plantar fasciitis 08/08/2013   Obesity, Class III, BMI 40-49.9 (morbid obesity) ([place] 04/09/2013   Unspecified sleep apnea 03/31/2013   PCO (polycystic ovaries) 04/11/2012   Neck pain 11/21/2010   FATTY LIVER DISEASE 01/26/2010   NAUSEA WITH VOMITING 01/26/2010   PORTAL HYPERTENSION 01/26/2010   Migraine headache 06/28/2009   BREAST MASS 08/21/2008   KNEE PAIN 04/14/2008   Hypothyroidism 03/17/2008   Hyperlipidemia 03/17/2008   DEPRESSION 03/17/2008   NEUROPATHY 03/17/2008   Essential hypertension 03/17/2008   ASTHMA 03/17/2008   GERD 03/17/2008   Headache 03/17/2008    ERennie Natter PT, DPT  10/18/2021, 9:38 AM  [facility] [address]  [place], [place], [address] Phone: [phone]   Fax:  [phone]  Name: Miranda [name] MRN: [id number] Date of Birth: [birth year]

## 2021-10-21 ENCOUNTER — Encounter: Payer: Self-pay | Admitting: Physical Therapy

## 2021-10-21 ENCOUNTER — Ambulatory Visit: Payer: BC Managed Care – PPO | Admitting: Physical Therapy

## 2021-10-21 ENCOUNTER — Other Ambulatory Visit: Payer: Self-pay

## 2021-10-21 DIAGNOSIS — R600 Localized edema: Secondary | ICD-10-CM

## 2021-10-21 DIAGNOSIS — M25561 Pain in right knee: Secondary | ICD-10-CM

## 2021-10-21 DIAGNOSIS — M25661 Stiffness of right knee, not elsewhere classified: Secondary | ICD-10-CM

## 2021-10-21 DIAGNOSIS — M6281 Muscle weakness (generalized): Secondary | ICD-10-CM

## 2021-10-21 DIAGNOSIS — R2689 Other abnormalities of gait and mobility: Secondary | ICD-10-CM

## 2021-10-21 NOTE — Therapy (Signed)
[facility] [address]  [place], [place], [address] Phone: [phone]   Fax:  [phone]  Physical Therapy Treatment  Patient Details  Name: Miranda [name] MRN: [id number] Date of Birth: 04-Dec-1975 Referring Provider (PT): Marcene Duos   Encounter Date: 10/21/2021   PT End of Session - 10/21/21 0902     Visit Number 29    Number of Visits 30    Date for PT Re-Evaluation 11/07/21    Authorization Type BCBS    Progress Note Due on Visit 24    PT Start Time 0850    PT Stop Time 0940    PT Time Calculation (min) 50 min    Activity Tolerance Patient tolerated treatment well;Patient limited by pain    Behavior During Therapy [facility] for tasks assessed/performed             Past Medical History:  Diagnosis Date   Anginal pain ([place])    Anxiety    Arthritis    Asthma    Bowel obstruction ([place])    Cardiac arrhythmia    Depression    sees Dr. Matilde Haymaker in [place]    Diabetes mellitus    sees Dr. Madelin Rear    Dyspnea    Dysrhythmia    GERD (gastroesophageal reflux disease)    Hyperlipidemia    Hypertension    Hypothyroidism    sees Dr. Elyse Hsu   Insomnia    Migraine syndrome    sees Dr. Teodora Medici at [facility]    Morbid obesity [facility])    Multiple personality ([place])    Neck pain    Neuropathy associated with endocrine disorder ([place])    Sleep apnea with use of continuous positive airway pressure (CPAP)    Sleep apnea is resolved due to weight loss. 08/2017   Stroke ([place]) 03/25/2016   left MCA    Stroke [facility]) 04/2016   sees Dr. Teodora Medici at [facility]     Past Surgical History:  Procedure Laterality Date   blocked itestinal repair  1975   age 47 months   BREAST BIOPSY Left 2017   CHOLECYSTECTOMY  2011   INDUCED ABORTION  1996   forced abortion   INGUINAL HERNIA REPAIR Left 1980   age 45   KNEE CLOSED REDUCTION Right 08/22/2021   Procedure: RIGHT  KNEE MANIPULATION UNDER  ANESTHESIA;  Surgeon: Meredith Pel, MD;  Location: [facility];  Service: Orthopedics;  Laterality: Right;   LAPAROSCOPIC ENDOMETRIOSIS FULGURATION  1998   TOTAL KNEE ARTHROPLASTY Right 07/05/2021   Procedure: TOTAL KNEE ARTHROPLASTY;  Surgeon: Meredith Pel, MD;  Location: [facility];  Service: Orthopedics;  Laterality: Right;   WISDOM TOOTH EXTRACTION  2007    There were no vitals filed for this visit.   Subjective Assessment - 10/21/21 0859     Subjective Both knees are extremely painful today, especially with standing/weight bearing.  She has been standing a lot cooking and cleaning to get ready for the holiday and both knees, especially the arthritic knee are worse.    Pertinent History depression, PTSD,migraine/ TIA (loss of sensation on L side), L ischemic stroke, T2DM, irregular heartbeat, gastroparesis, IBS, asthma, GERD    Patient Stated Goals normal function on knee, get it strainght and get it bent    Currently in Pain? Yes    Pain Score 2     Pain Location Knee    Pain Orientation Right  Multiple Pain Sites Yes    Pain Score 5    Pain Location Knee    Pain Orientation Left    Pain Type Chronic pain                               [facility] Adult PT Treatment/Exercise - 10/21/21 0001       Knee/Hip Exercises: Aerobic   Nustep L4 x 7 min      Knee/Hip Exercises: Supine   Bridges Strengthening;2 sets;10 reps    Straight Leg Raises Strengthening;Both;2 sets;10 reps      Knee/Hip Exercises: Sidelying   Clams 2 x 10 bil - pillow under hips for comfort      Knee/Hip Exercises: Prone   Hamstring Curl 10 reps    Hip Extension Strengthening;Both;10 reps      Modalities   Modalities Moist Heat      Moist Heat Therapy   Number Minutes Moist Heat 10 Minutes    Moist Heat Location Knee   bil                      PT Short Term Goals - 08/11/21 1518       PT SHORT TERM GOAL #1   Title Ind with initial HEP    Time 2     Period Weeks    Status Achieved    Target Date 08/16/21               PT Long Term Goals - 09/26/21 1329       PT LONG TERM GOAL #1   Title Ind with progressed HEP for LE strengthening/gait    Time 8    Period Weeks    Status On-going   met for current   Target Date 11/07/21      PT LONG TERM GOAL #2   Title Pt. will improve R knee extension to no more than lacking 5 deg full extension for safety with gait.    Baseline lacking 20 deg R knee extension.    Time 8    Period Weeks    Status Partially Met   09/19/21- lacking 4 deg 11/28- lacking 10 deg.   Target Date 11/07/21      PT LONG TERM GOAL #3   Title Pt. will demonstrate 115 deg R knee flexion to be able to perform activities safely    Baseline 80 deg R knee flexion    Time 8    Period Weeks    Status On-going   10/13- 90 deg 10/25- 96 deg 08/31/21- 100 deg  11/28- 105 deg.   Target Date 11/07/21      PT LONG TERM GOAL #4   Title Pt. will be able to ambulate 1000' without AD safely without limitations from pain to access community.    Baseline 2WRW, slow antalgic gait    Time 8    Period Weeks    Status On-going   10/13- using [facility] 11/2- able to ambulate without AD but antalgic  11/28- antalgic, visually slow gait speed, no AD   Target Date 11/07/21      PT LONG TERM GOAL #5   Title Patient will demonstrate improved functional LE strength by completing 5xSTS < 14 seconds.    Baseline 24 seconds.    Time 8    Period Weeks    Status On-going   09/26/21- will assess next session.  Target Date 11/07/21      PT LONG TERM GOAL #6   Title Pt. will report no more than 2/10 R knee pain with activity.    Baseline 5-8/10 depending on activity    Time 8    Period Weeks    Status On-going   10/25- 5/10 with bike  09/26/21- only 1-2/10 at rest, but still increases with activity.  Noted allodynia over R medial knee.   Target Date 11/07/21                   Plan - 10/21/21 1227     Clinical Impression  Statement [name] reports increased pain today in bil knees and decreased tolerance to standing, so focused on continueing LE strengthening exercises in supine.  She tolerated fairly well, more limited by arthritic knee than post-surgical knee.  MHP applied to bil knees end of session to decrease pain and soreness. Pt. would benefit from continued skilled therapy.    Comorbidities depression, PTSD, obesity, TIA/migraine, T2DM, hypertension, HT, gastroparesis, IBS, asthma, GERD, L knee OA, history ischemic stroke, heart irregularities    PT Treatment/Interventions ADLs/Self Care Home Management;Cryotherapy;Electrical Stimulation;Iontophoresis 82m/ml Dexamethasone;Moist Heat;Ultrasound;Gait training;Stair training;Functional mobility training;Therapeutic activities;Therapeutic exercise;Balance training;Neuromuscular re-education;Patient/family education;Manual techniques;Scar mobilization;Passive range of motion;Dry needling;Taping;Vasopneumatic Device;Joint Manipulations    PT Next Visit Plan keep working on R knee ROM and strength as tolerated.             Patient will benefit from skilled therapeutic intervention in order to improve the following deficits and impairments:  Abnormal gait, Decreased activity tolerance, Decreased endurance, Decreased range of motion, Decreased skin integrity, Decreased strength, Hypomobility, Increased fascial restricitons, Impaired sensation, Pain, Decreased mobility, Decreased scar mobility, Difficulty walking, Increased edema, Increased muscle spasms, Impaired flexibility  Visit Diagnosis: Acute pain of right knee  Stiffness of right knee, not elsewhere classified  Other abnormalities of gait and mobility  Muscle weakness (generalized)  Localized edema     Problem List Patient Active Problem List   Diagnosis Date Noted   Arthrofibrosis of knee joint, right    Arthritis of right knee    Diabetic gastroparesis ([place] 07/20/2021   Constipation 07/20/2021    S/P total knee arthroplasty, right 07/05/2021   Chronic diarrhea 04/29/2021   Belching 04/29/2021   Primary osteoarthritis of both knees 10/28/2020   Irritable bowel syndrome with diarrhea 10/16/2019   Tendinitis of right rotator cuff 09/17/2019   Hand pain, right 05/21/2019   Labral tear of hip joint 05/21/2019   Type 2 diabetes mellitus with diabetic neuropathy, unspecified ([place] 10/01/2018   Right hip pain 05/22/2018   DTennis MustQuervain's disease (radial styloid tenosynovitis) 04/02/2018   Carpal tunnel syndrome, right upper limb 12/31/2017   Elbow injury, right, initial encounter 12/16/2017   Chronic pain of both shoulders 11/30/2017   PTSD (post-traumatic stress disorder) 10/25/2016   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[place] 10/25/2016   Diabetes mellitus ([place] 10/25/2016   Major depressive disorder, recurrent episode, severe, with psychosis ([place] 10/20/2016   Major depressive disorder, recurrent episode, severe, 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[place] 10/19/2016   TIA (transient ischemic attack) 05/16/2016   Arterial ischemic stroke, MCA, left, acute ([place] 03/29/2016   Plantar fasciitis 08/08/2013   Obesity, Class III, BMI 40-49.9 (morbid obesity) ([place] 04/09/2013   Unspecified sleep apnea 03/31/2013   PCO (polycystic ovaries) 04/11/2012   Neck pain 11/21/2010   FATTY LIVER DISEASE 01/26/2010   NAUSEA WITH VOMITING 01/26/2010   PORTAL HYPERTENSION 01/26/2010   Migraine headache 06/28/2009   BREAST  MASS 08/21/2008   KNEE PAIN 04/14/2008   Hypothyroidism 03/17/2008   Hyperlipidemia 03/17/2008   DEPRESSION 03/17/2008   NEUROPATHY 03/17/2008   Essential hypertension 03/17/2008   ASTHMA 03/17/2008   GERD 03/17/2008   Headache 03/17/2008    Rennie Natter, PT, DPT  10/21/2021, 12:35 PM  [facility] [address]  [place], [place], [address] Phone: [phone]   Fax:  [phone]  Name: ARAYLA KRUSCHKE MRN:  [id number] Date of Birth: 1976-07-11

## 2021-10-26 ENCOUNTER — Ambulatory Visit: Payer: BC Managed Care – PPO | Admitting: Physical Therapy

## 2021-10-28 ENCOUNTER — Ambulatory Visit: Payer: BC Managed Care – PPO | Admitting: Physical Therapy

## 2021-10-28 ENCOUNTER — Other Ambulatory Visit: Payer: Self-pay

## 2021-10-28 ENCOUNTER — Encounter: Payer: Self-pay | Admitting: Physical Therapy

## 2021-10-28 DIAGNOSIS — M25661 Stiffness of right knee, not elsewhere classified: Secondary | ICD-10-CM

## 2021-10-28 DIAGNOSIS — R2689 Other abnormalities of gait and mobility: Secondary | ICD-10-CM

## 2021-10-28 DIAGNOSIS — R600 Localized edema: Secondary | ICD-10-CM

## 2021-10-28 DIAGNOSIS — M6281 Muscle weakness (generalized): Secondary | ICD-10-CM

## 2021-10-28 DIAGNOSIS — M25561 Pain in right knee: Secondary | ICD-10-CM

## 2021-10-28 NOTE — Therapy (Signed)
[facility] [address]  [place], [place], [address] Phone: [phone]   Fax:  [phone]  Physical Therapy Treatment  Patient Details  Name: Miranda [name] MRN: [id number] Date of Birth: Apr 20, 1976 Referring Provider (PT): Marcene Duos   Encounter Date: 10/28/2021   PT End of Session - 10/28/21 0808     Visit Number 30    Number of Visits 33    Date for PT Re-Evaluation 11/07/21    Authorization Type BCBS    Progress Note Due on Visit 81    PT Start Time 0802    PT Stop Time 0846    PT Time Calculation (min) 44 min    Activity Tolerance Patient tolerated treatment well    Behavior During Therapy [facility] for tasks assessed/performed             Past Medical History:  Diagnosis Date   Anginal pain ([place])    Anxiety    Arthritis    Asthma    Bowel obstruction ([place])    Cardiac arrhythmia    Depression    sees Dr. Matilde Haymaker in [place]    Diabetes mellitus    sees Dr. Madelin Rear    Dyspnea    Dysrhythmia    GERD (gastroesophageal reflux disease)    Hyperlipidemia    Hypertension    Hypothyroidism    sees Dr. Elyse Hsu   Insomnia    Migraine syndrome    se[facility] Dr. 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 08/2017   Stroke ([place]) 03/25/2016   left MCA    Stroke [facility]) 04/2016   sees Dr. Teodora Medici at [facility]     Past Surgical History:  Procedure Laterality Date   blocked itestinal repair  1975   age 84 months   BREAST BIOPSY Left 2017   CHOLECYSTECTOMY  2011   INDUCED ABORTION  1996   forced abortion   INGUINAL HERNIA REPAIR Left 1980   age 9   KNEE CLOSED REDUCTION Right 08/22/2021   Procedure: RIGHT  KNEE MANIPULATION UNDER ANESTHESIA;  Surgeon:  Meredith Pel, MD;  Location: [facility];  Service: Orthopedics;  Laterality: Right;   LAPAROSCOPIC ENDOMETRIOSIS FULGURATION  1998   TOTAL KNEE ARTHROPLASTY Right 07/05/2021   Procedure: TOTAL KNEE ARTHROPLASTY;  Surgeon: Meredith Pel, MD;  Location: [facility];  Service: Orthopedics;  Laterality: Right;   WISDOM TOOTH EXTRACTION  2007    There were no vitals filed for this visit.   Subjective Assessment - 10/28/21 0805     Subjective Patient reports her R knee still hurts when she is walking around,    Pertinent History depression, PTSD,migraine/ TIA (loss of sensation on L side), L ischemic stroke, T2DM, irregular heartbeat, gastroparesis, IBS, asthma, GERD    Patient Stated Goals normal function on knee, get it strainght and get it bent    Currently in Pain? Yes    Pain Score 3     Pain Location Knee    Pain Orientation Right                [facility] PT Assessment - 10/28/21 0001  Assessment   Medical Diagnosis Z98.890 (ICD-10-CM) - Post-operative state R TKA    Referring Provider (PT) Marcene Duos    Onset Date/Surgical Date 07/05/21    Next MD Visit 11/08/2021      AROM   Right Knee Extension 2    Right Knee Flexion 113                           [facility] Adult PT Treatment/Exercise - 10/28/21 0001       Exercises   Exercises Knee/Hip      Knee/Hip Exercises: Aerobic   Nustep L5 x 6 min      Knee/Hip Exercises: Standing   Lateral Step Up Right;2 sets;10 reps;Hand Hold: 1;Step Height: 8"    Forward Step Up Right;2 sets;10 reps;Hand Hold: 1;Step Height: 8"    Step Down Right;2 sets;10 reps;Hand Hold: 1;Step Height: 2"    Gait Training 1000' without AD      Knee/Hip Exercises: Sidelying   Hip ABduction Strengthening;Right;3 sets;10 reps      Manual Therapy   Manual Therapy Soft tissue mobilization    Soft tissue mobilization IASTM to L glut/piriformis to decrease muscle spasm and pain.                        PT Short Term Goals - 08/11/21 1518       PT SHORT TERM GOAL #1   Title Ind with initial HEP    Time 2    Period Weeks    Status Achieved    Target Date 08/16/21               PT Long Term Goals - 10/28/21 0819       PT LONG TERM GOAL #1   Title Ind with progressed HEP for LE strengthening/gait    Time 8    Period Weeks    Status Achieved   met for current   Target Date 11/07/21      PT LONG TERM GOAL #2   Title Pt. will improve R knee extension to no more than lacking 5 deg full extension for safety with gait.    Baseline lacking 20 deg R knee extension.    Time 8    Period Weeks    Status Achieved   09/19/21- lacking 4 deg 11/28- lacking 10 deg.  10/28/21- lacking 2 deg.   Target Date 11/07/21      PT LONG TERM GOAL #3   Title Pt. will demonstrate 115 deg R knee flexion to be able to perform activities safely    Baseline 80 deg R knee flexion    Time 8    Period Weeks    Status Partially Met   10/13- 90 deg 10/25- 96 deg 08/31/21- 100 deg  11/28- 105 deg.  10/28/21- 113 deg   Target Date 11/07/21      PT LONG TERM GOAL #4   Title Pt. will be able to ambulate 1000' without AD safely without limitations from pain to access community.    Baseline 2WRW, slow antalgic gait    Time 8    Period Weeks    Status Achieved   10/13- using [facility] 11/2- able to ambulate without AD but antalgic  11/28- antalgic, visually slow gait speed, no AD  10/28/21- met.  Able to ambulate 1000' in 6 min without AD, limited by L arthritic knee pain, not R knee pain.  Target Date 11/07/21      PT LONG TERM GOAL #5   Title Patient will demonstrate improved functional LE strength by completing 5xSTS < 14 seconds.    Baseline 24 seconds.    Time 8    Period Weeks    Status --   10/28/21- 8.72 sec   Target Date 11/07/21      PT LONG TERM GOAL #6   Title Pt. will report no more than 2/10 R knee pain with activity.    Baseline 5-8/10 depending on activity    Time 8     Period Weeks    Status On-going   10/25- 5/10 with bike  09/26/21- only 1-2/10 at rest, but still increases with activity.  Noted allodynia over R medial knee. 10/28/21- 3/10 R knee pain with activity   Target Date 11/07/21                   Plan - 10/28/21 0900     Clinical Impression Statement Miranda [name] continues to report R knee pain, stiffness, and clicking in R knee.  However, she demonstrated signifcant gains in R knee ROM today, measuring 2-113 deg.  She still has poor eccentric control with descending steps and reports descending sideways at home.  She was able to walk 1000' today without limitation from R knee, but due to L knee pain and altered gait mechanics reported increased L piriformis pain at end.  She has MD appt. on 11/08/21 which she will discuss clicking, pain, and L knee.  She also has pain with rolling over in bed, given sidelying SLR for strengthening to address.  Focus of remaining sessions on continued strengthening, plan to discharge at end of this plan of care.    Comorbidities depression, PTSD, obesity, TIA/migraine, T2DM, hypertension, HT, gastroparesis, IBS, asthma, GERD, L knee OA, history ischemic stroke, heart irregularities    PT Treatment/Interventions ADLs/Self Care Home Management;Cryotherapy;Electrical Stimulation;Iontophoresis 75m/ml Dexamethasone;Moist Heat;Ultrasound;Gait training;Stair training;Functional mobility training;Therapeutic activities;Therapeutic exercise;Balance training;Neuromuscular re-education;Patient/family education;Manual techniques;Scar mobilization;Passive range of motion;Dry needling;Taping;Vasopneumatic Device;Joint Manipulations    PT Next Visit Plan keep working on R knee ROM and strength as tolerated.             Patient will benefit from skilled therapeutic intervention in order to improve the following deficits and impairments:  Abnormal gait, Decreased activity tolerance, Decreased endurance, Decreased range of motion,  Decreased skin integrity, Decreased strength, Hypomobility, Increased fascial restricitons, Impaired sensation, Pain, Decreased mobility, Decreased scar mobility, Difficulty walking, Increased edema, Increased muscle spasms, Impaired flexibility  Visit Diagnosis: Acute pain of right knee  Stiffness of right knee, not elsewhere classified  Other abnormalities of gait and mobility  Muscle weakness (generalized)  Localized edema     Problem List Patient Active Problem List   Diagnosis Date Noted   Arthrofibrosis of knee joint, right    Arthritis of right knee    Diabetic gastroparesis ([place] 07/20/2021   Constipation 07/20/2021   S/P total knee arthroplasty, right 07/05/2021   Chronic diarrhea 04/29/2021   Belching 04/29/2021   Primary osteoarthritis of both knees 10/28/2020   Irritable bowel syndrome with diarrhea 10/16/2019   Tendinitis of right rotator cuff 09/17/2019   Hand pain, right 05/21/2019   Labral tear of hip joint 05/21/2019   Type 2 diabetes mellitus with diabetic neuropathy, unspecified ([place] 10/01/2018   Right hip pain 05/22/2018   DH[place] Phodisease (radial styloid tenosynovitis) 04/02/2018   Carpal tunnel syndrome, right upper limb 12/31/2017   Elbow injury,  right, initial encounter 12/16/2017   Chronic pain of both shoulders 11/30/2017   PTSD (post-traumatic stress disorder) 10/25/2016   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[place]) 10/25/2016   Diabetes mellitus ([place]) 10/25/2016   Major depressive disorder, recurrent episode, severe, with psychosis ([place]) 10/20/2016   Major depressive disorder, recurrent episode, severe, 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[place]) 10/19/2016   TIA (transient ischemic attack) 05/16/2016   Arterial ischemic stroke, MCA, left, acute ([place]) 03/29/2016   Plantar fasciitis 08/08/2013   Obesity, Class III, BMI 40-49.9 (morbid obesity) ([place]) 04/09/2013   Unspecified sleep apnea 03/31/2013   PCO (polycystic ovaries) 04/11/2012   Neck pain 11/21/2010    FATTY LIVER DISEASE 01/26/2010   NAUSEA WITH VOMITING 01/26/2010   PORTAL HYPERTENSION 01/26/2010   Migraine headache 06/28/2009   BREAST MASS 08/21/2008   KNEE PAIN 04/14/2008   Hypothyroidism 03/17/2008   Hyperlipidemia 03/17/2008   DEPRESSION 03/17/2008   NEUROPATHY 03/17/2008   Essential hypertension 03/17/2008   ASTHMA 03/17/2008   GERD 03/17/2008   Headache 03/17/2008    Rennie Natter, PT, DPT  10/28/2021, 9:09 AM  [facility] [address]  [place], [place], [address] Phone: [phone]   Fax:  [phone]  Name: Miranda [name] MRN: [id number] Date of Birth: 1976-01-20

## 2021-11-01 ENCOUNTER — Ambulatory Visit: Payer: BC Managed Care – PPO

## 2021-11-02 ENCOUNTER — Other Ambulatory Visit: Payer: Self-pay | Admitting: Nurse Practitioner

## 2021-11-02 DIAGNOSIS — N6342 Unspecified lump in left breast, subareolar: Secondary | ICD-10-CM

## 2021-11-04 ENCOUNTER — Ambulatory Visit: Payer: BC Managed Care – PPO | Attending: Orthopedic Surgery | Admitting: Physical Therapy

## 2021-11-04 ENCOUNTER — Encounter: Payer: Self-pay | Admitting: Physical Therapy

## 2021-11-04 ENCOUNTER — Other Ambulatory Visit: Payer: Self-pay

## 2021-11-04 DIAGNOSIS — M25561 Pain in right knee: Secondary | ICD-10-CM | POA: Diagnosis not present

## 2021-11-04 DIAGNOSIS — R600 Localized edema: Secondary | ICD-10-CM | POA: Diagnosis present

## 2021-11-04 DIAGNOSIS — M6281 Muscle weakness (generalized): Secondary | ICD-10-CM | POA: Insufficient documentation

## 2021-11-04 DIAGNOSIS — R2689 Other abnormalities of gait and mobility: Secondary | ICD-10-CM | POA: Insufficient documentation

## 2021-11-04 DIAGNOSIS — M25661 Stiffness of right knee, not elsewhere classified: Secondary | ICD-10-CM | POA: Diagnosis present

## 2021-11-04 NOTE — Therapy (Signed)
[facility] [address]  [place], [place], [address] Phone: [phone]   Fax:  [phone]  Physical Therapy Treatment  Patient Details  Name: Miranda [name] MRN: [id number] Date of Birth: 03/23/1976 Referring Provider (PT): Marcene Duos   Encounter Date: 11/04/2021   PT End of Session - 11/04/21 0902     Visit Number 31    Number of Visits 33    Date for PT Re-Evaluation 11/07/21    Authorization Type BCBS    Progress Note Due on Visit 60    PT Start Time 0853    PT Stop Time 0931    PT Time Calculation (min) 38 min    Activity Tolerance Patient tolerated treatment well    Behavior During Therapy [facility] for tasks assessed/performed             Past Medical History:  Diagnosis Date   Anginal pain ([place])    Anxiety    Arthritis    Asthma    Bowel obstruction ([place])    Cardiac arrhythmia    Depression    sees Dr. Matilde Haymaker in [place]    Diabetes mellitus    sees Dr. Madelin Rear    Dyspnea    Dysrhythmia    GERD (gastroesophageal reflux disease)    Hyperlipidemia    Hypertension    Hypothyroidism    sees Dr. Elyse Hsu   Insomnia    Migraine syndrome    se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 08/2017   Stroke ([place]) 03/25/2016   left MCA    Stroke [facility]) 04/2016   sees Dr. Teodora Medici at [facility]     Past Surgical History:  Procedure Laterality Date   blocked itestinal repair  1975   age 52 months   BREAST BIOPSY Left 2017   CHOLECYSTECTOMY  2011   INDUCED ABORTION  1996   forced abortion   INGUINAL HERNIA REPAIR Left 1980   age 73   KNEE CLOSED REDUCTION Right 08/22/2021   Procedure: RIGHT  KNEE MANIPULATION UNDER ANESTHESIA;  Surgeon: Meredith Pel, MD;  Location: [place];  Service: Orthopedics;  Laterality: Right;   LAPAROSCOPIC ENDOMETRIOSIS FULGURATION  1998   TOTAL KNEE ARTHROPLASTY Right 07/05/2021   Procedure: TOTAL KNEE ARTHROPLASTY;  Surgeon: Meredith Pel, MD;  Location: [place];  Service: Orthopedics;  Laterality: Right;   WISDOM TOOTH EXTRACTION  2007    There were no vitals filed for this visit.   Subjective Assessment - 11/04/21 0853     Subjective Patient is angry today, her knee is hurting more and clicking like crazy.   The numbness is wearing off and not longer has that "cuff" around her knee.  She reports painful getting up from low toilet.  She did report easier to bend her knee to step over tub, but still painful.    Pertinent History depression, PTSD,migraine/ TIA (loss of sensation on L side), L ischemic stroke, T2DM, irregular heartbeat, gastroparesis, IBS, asthma, GERD    Patient Stated Goals normal function on knee, get it strainght and get it bent    Currently in Pain? Yes    Pain  Score 5     Pain Location Knee    Pain Orientation Right                               [facility] Adult PT Treatment/Exercise - 11/04/21 0001       Exercises   Exercises Knee/Hip      Knee/Hip Exercises: Aerobic   Nustep L5 x 6 min      Knee/Hip Exercises: Supine   Straight Leg Raises Strengthening;Right;2 sets;10 reps    Straight Leg Raises Limitations cues for quad set    Patellar Mobs all directions      Modalities   Modalities Ultrasound      Ultrasound   Ultrasound Location R lateral knee    Ultrasound Parameters 3.3 MHz, 1.2 w/cm2 continuous    Ultrasound Goals Pain      Manual Therapy   Manual Therapy Soft tissue mobilization;Joint mobilization    Joint Mobilization patellar mobs    Soft tissue mobilization IASTM to R knee with s/s tools focusing on scar tissue superior to patella                       PT Short Term Goals - 08/11/21 1518        PT SHORT TERM GOAL #1   Title Ind with initial HEP    Time 2    Period Weeks    Status Achieved    Target Date 08/16/21               PT Long Term Goals - 10/28/21 0819       PT LONG TERM GOAL #1   Title Ind with progressed HEP for LE strengthening/gait    Time 8    Period Weeks    Status Achieved   met for current   Target Date 11/07/21      PT LONG TERM GOAL #2   Title Pt. will improve R knee extension to no more than lacking 5 deg full extension for safety with gait.    Baseline lacking 20 deg R knee extension.    Time 8    Period Weeks    Status Achieved   09/19/21- lacking 4 deg 11/28- lacking 10 deg.  10/28/21- lacking 2 deg.   Target Date 11/07/21      PT LONG TERM GOAL #3   Title Pt. will demonstrate 115 deg R knee flexion to be able to perform activities safely    Baseline 80 deg R knee flexion    Time 8    Period Weeks    Status Partially Met   10/13- 90 deg 10/25- 96 deg 08/31/21- 100 deg  11/28- 105 deg.  10/28/21- 113 deg   Target Date 11/07/21      PT LONG TERM GOAL #4   Title Pt. will be able to ambulate 1000' without AD safely without limitations from pain to access community.    Baseline 2WRW, slow antalgic gait    Time 8    Period Weeks    Status Achieved   10/13- using SPC 11/2- able to ambulate without AD but antalgic  11/28- antalgic, visually slow gait speed, no AD  10/28/21- met.  Able to ambulate 1000' in 6 min without AD, limited by L arthritic knee pain, not R knee pain.   Target Date 11/07/21      PT LONG TERM GOAL #5   Title Patient  will demonstrate improved functional LE strength by completing 5xSTS < 14 seconds.    Baseline 24 seconds.    Time 8    Period Weeks    Status --   10/28/21- 8.72 sec   Target Date 11/07/21      PT LONG TERM GOAL #6   Title Pt. will report no more than 2/10 R knee pain with activity.    Baseline 5-8/10 depending on activity    Time 8    Period Weeks    Status On-going   10/25- 5/10 with bike   09/26/21- only 1-2/10 at rest, but still increases with activity.  Noted allodynia over R medial knee. 10/28/21- 3/10 R knee pain with activity   Target Date 11/07/21                   Plan - 11/04/21 0936     Clinical Impression Statement Patient arrived today continuing to reports R knee pain and clicking.  Due to pain level deferred stairs or weight bearing exercises, instead focusing on manual therapy and [place], afterwhich she reported significant decrease in pain even standing and walking.  She would benefit from continued skilled therapy.    Comorbidities depression, PTSD, obesity, TIA/migraine, T2DM, hypertension, HT, gastroparesis, IBS, asthma, GERD, L knee OA, history ischemic stroke, heart irregularities    PT Treatment/Interventions ADLs/Self Care Home Management;Cryotherapy;Electrical Stimulation;Iontophoresis 29m/ml Dexamethasone;Moist Heat;Ultrasound;Gait training;Stair training;Functional mobility training;Therapeutic activities;Therapeutic exercise;Balance training;Neuromuscular re-education;Patient/family education;Manual techniques;Scar mobilization;Passive range of motion;Dry needling;Taping;Vasopneumatic Device;Joint Manipulations    PT Next Visit Plan keep working on R knee ROM and strength as tolerated.             Patient will benefit from skilled therapeutic intervention in order to improve the following deficits and impairments:  Abnormal gait, Decreased activity tolerance, Decreased endurance, Decreased range of motion, Decreased skin integrity, Decreased strength, Hypomobility, Increased fascial restricitons, Impaired sensation, Pain, Decreased mobility, Decreased scar mobility, Difficulty walking, Increased edema, Increased muscle spasms, Impaired flexibility  Visit Diagnosis: Acute pain of right knee  Stiffness of right knee, not elsewhere classified  Other abnormalities of gait and mobility  Muscle weakness (generalized)  Localized  edema     Problem List Patient Active Problem List   Diagnosis Date Noted   Arthrofibrosis of knee joint, right    Arthritis of right knee    Diabetic gastroparesis ([place] 07/20/2021   Constipation 07/20/2021   S/P total knee arthroplasty, right 07/05/2021   Chronic diarrhea 04/29/2021   Belching 04/29/2021   Primary osteoarthritis of both knees 10/28/2020   Irritable bowel syndrome with diarrhea 10/16/2019   Tendinitis of right rotator cuff 09/17/2019   Hand pain, right 05/21/2019   Labral tear of hip joint 05/21/2019   Type 2 diabetes mellitus with diabetic neuropathy, unspecified ([place] 10/01/2018   Right hip pain 05/22/2018   DTennis MustQuervain's disease (radial styloid tenosynovitis) 04/02/2018   Carpal tunnel syndrome, right upper limb 12/31/2017   Elbow injury, right, initial encounter 12/16/2017   Chronic pain of both shoulders 11/30/2017   PTSD (post-traumatic stress disorder) 10/25/2016   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[place] 10/25/2016   Diabetes mellitus ([place] 10/25/2016   Major depressive disorder, recurrent episode, severe, with psychosis ([place] 10/20/2016   Major depressive disorder, recurrent episode, severe, 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[place] 10/19/2016   TIA (transient ischemic attack) 05/16/2016   Arterial ischemic stroke, MCA, left, acute ([place] 03/29/2016   Plantar fasciitis 08/08/2013   Obesity, Class III, BMI 40-49.9 (morbid obesity) ([place] 04/09/2013   Unspecified  sleep apnea 03/31/2013   PCO (polycystic ovaries) 04/11/2012   Neck pain 11/21/2010   FATTY LIVER DISEASE 01/26/2010   NAUSEA WITH VOMITING 01/26/2010   PORTAL HYPERTENSION 01/26/2010   Migraine headache 06/28/2009   BREAST MASS 08/21/2008   KNEE PAIN 04/14/2008   Hypothyroidism 03/17/2008   Hyperlipidemia 03/17/2008   DEPRESSION 03/17/2008   NEUROPATHY 03/17/2008   Essential hypertension 03/17/2008   ASTHMA 03/17/2008   GERD 03/17/2008   Headache 03/17/2008    Rennie Natter, PT, DPT   11/04/2021, 9:40 AM  [facility] [address]  [place], [place], [address] Phone: [phone]   Fax:  [phone]  Name: Miranda [name] MRN: [id number] Date of Birth: Mar 20, 1976

## 2021-11-08 ENCOUNTER — Ambulatory Visit
Admission: RE | Admit: 2021-11-08 | Discharge: 2021-11-08 | Disposition: A | Discharge status: Home / Self Care | Payer: BC Managed Care – PPO | Source: Ambulatory Visit | Attending: Family Medicine | Admitting: Family Medicine

## 2021-11-08 ENCOUNTER — Ambulatory Visit (INDEPENDENT_AMBULATORY_CARE_PROVIDER_SITE_OTHER): Payer: BC Managed Care – PPO | Admitting: Family Medicine

## 2021-11-08 ENCOUNTER — Other Ambulatory Visit: Payer: Self-pay

## 2021-11-08 ENCOUNTER — Ambulatory Visit: Payer: BC Managed Care – PPO | Admitting: Physical Therapy

## 2021-11-08 VITALS — BP 112/68 | Ht 61.75 in | Wt 206.0 lb

## 2021-11-08 DIAGNOSIS — M6281 Muscle weakness (generalized): Secondary | ICD-10-CM

## 2021-11-08 DIAGNOSIS — R2689 Other abnormalities of gait and mobility: Secondary | ICD-10-CM

## 2021-11-08 DIAGNOSIS — M25661 Stiffness of right knee, not elsewhere classified: Secondary | ICD-10-CM

## 2021-11-08 DIAGNOSIS — G8929 Other chronic pain: Secondary | ICD-10-CM | POA: Diagnosis not present

## 2021-11-08 DIAGNOSIS — M25562 Pain in left knee: Secondary | ICD-10-CM | POA: Diagnosis not present

## 2021-11-08 DIAGNOSIS — M25561 Pain in right knee: Secondary | ICD-10-CM

## 2021-11-08 DIAGNOSIS — R600 Localized edema: Secondary | ICD-10-CM

## 2021-11-08 NOTE — Patient Instructions (Signed)
Get x-rays of the right knee to ensure the replacement hasn't shifted. I think you have some nerve irritation responsible for the pain down the outside of your right leg. This will improve over time. Continue the gabapentin, physical therapy and home exercises. We will add physical therapy for this left knee also. Injections are an option as before. Follow up with me in 6 weeks.

## 2021-11-08 NOTE — Therapy (Signed)
[facility] [address]  [place], [place], [address] Phone: [phone]   Fax:  [phone]  Physical Therapy Discharge  PHYSICAL THERAPY DISCHARGE SUMMARY  Visits from Start of Care: 32  Current functional level related to goals / functional outcomes: Functional R knee ROM (2-113), improved strength (5/5), improved gait, FOTO 57%   Remaining deficits: Pain and R knee clicking (also painful) R knee   Education / Equipment: HEP  Plan: Patient agrees to discharge.   Patient is being discharged due to meeting the stated rehab goals.   Referred back to orthopedist for worsening R knee pain.        Patient Details  Name: Miranda [name] MRN: [id number] Date of Birth: 03-29-1976 Referring Provider (PT): Marcene Duos   Encounter Date: 11/08/2021   PT End of Session - 11/08/21 0946     Visit Number 32    Number of Visits 33    Date for PT Re-Evaluation 11/07/21    Authorization Type BCBS    Progress Note Due on Visit 60    PT Start Time 0930    PT Stop Time 1015    PT Time Calculation (min) 45 min    Activity Tolerance Patient tolerated treatment well    Behavior During Therapy [facility] for tasks assessed/performed             Past Medical History:  Diagnosis Date   Anginal pain ([place])    Anxiety    Arthritis    Asthma    Bowel obstruction ([place])    Cardiac arrhythmia    Depression    sees Dr. Matilde Haymaker in [place]    Diabetes mellitus    sees Dr. Madelin Rear    Dyspnea    Dysrhythmia    GERD (gastroesophageal reflux disease)    Hyperlipidemia    Hypertension    Hypothyroidism    sees Dr. Elyse Hsu   Insomnia    Migraine syndrome    se[facility] Dr. 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 08/2017   Stroke ([place]) 03/25/2016   left MCA    Stroke [facility]) 04/2016   sees Dr. Teodora Medici at [facility]     Past Surgical History:  Procedure Laterality Date   blocked itestinal repair  1975   age 62 months   BREAST BIOPSY Left 2017   CHOLECYSTECTOMY  2011   INDUCED ABORTION  1996   forced abortion   INGUINAL HERNIA REPAIR Left 1980   age 46   KNEE CLOSED REDUCTION Right 08/22/2021   Procedure: RIGHT  KNEE MANIPULATION UNDER ANESTHESIA;  Surgeon: Meredith Pel, MD;  Location: [place];  Service: Orthopedics;  Laterality: Right;   LAPAROSCOPIC ENDOMETRIOSIS FULGURATION  1998   TOTAL KNEE ARTHROPLASTY Right 07/05/2021   Procedure: TOTAL KNEE ARTHROPLASTY;  Surgeon: Meredith Pel, MD;  Location: [place];  Service: Orthopedics;  Laterality: Right;   WISDOM TOOTH EXTRACTION  2007    There were no vitals filed for this visit.   Subjective Assessment - 11/08/21 0937     Subjective Pt feels that there is something wrong with her knee,    Pertinent History depression, PTSD,migraine/  TIA (loss of sensation on L side), L ischemic stroke, T2DM, irregular heartbeat, gastroparesis, IBS, asthma, GERD    Diagnostic tests X-ray    Patient Stated Goals normal function on knee, get it strainght and get it bent    Currently in Pain? Yes    Pain Score 5     Pain Location Knee    Pain Orientation Right                [facility] PT Assessment - 11/08/21 0001       Assessment   Medical Diagnosis Z98.890 (ICD-10-CM) - Post-operative state R TKA    Referring Provider (PT) Marcene Duos    Onset Date/Surgical Date 07/05/21    Next MD Visit 11/08/2021      Observation/Other Assessments   Focus on Therapeutic Outcomes (FOTO)  57      AROM   Right Knee Extension 2    Right Knee Flexion 113                           [facility] Adult PT Treatment/Exercise - 11/08/21 0001       Knee/Hip Exercises: Aerobic   Recumbent Bike L1  x 10 min      Modalities   Modalities Ultrasound      Ultrasound   Ultrasound Location R lateral knee    Ultrasound Parameters 3.3 MHz, 1.2 w/cm2, cont    Ultrasound Goals Pain      Manual Therapy   Manual Therapy Soft tissue mobilization;Joint mobilization    Joint Mobilization patellar mobs    Soft tissue mobilization STM and IASTM to R peroneals with s/s tools, scar tissue mobilization proximal incision over quad                       PT Short Term Goals - 08/11/21 1518       PT SHORT TERM GOAL #1   Title Ind with initial HEP    Time 2    Period Weeks    Status Achieved    Target Date 08/16/21               PT Long Term Goals - 11/08/21 1025       PT LONG TERM GOAL #1   Title Ind with progressed HEP for LE strengthening/gait    Time 8    Period Weeks    Status Achieved   met for current   Target Date 11/07/21      PT LONG TERM GOAL #2   Title Pt. will improve R knee extension to no more than lacking 5 deg full extension for safety with gait.    Baseline lacking 20 deg R knee extension.    Time 8    Period Weeks    Status Achieved   09/19/21- lacking 4 deg 11/28- lacking 10 deg.  10/28/21- lacking 2 deg.   Target Date 11/07/21      PT LONG TERM GOAL #3   Title Pt. will demonstrate 115 deg R knee flexion to be able to perform activities safely    Baseline 80 deg R knee flexion    Time 8    Period Weeks    Status Partially Met   10/13- 90 deg 10/25- 96 deg 08/31/21- 100 deg  11/28- 105 deg.  10/28/21- 113 deg  11/08/2021- 113 deg   Target Date 11/07/21      PT LONG TERM GOAL #4  Title Pt. will be able to ambulate 1000' without AD safely without limitations from pain to access community.    Baseline 2WRW, slow antalgic gait    Time 8    Period Weeks    Status Achieved   10/13- using [facility] 11/2- able to ambulate without AD but antalgic  10/28/21- met.  Able to ambulate 1000' in 6 min without AD, limited by L arthritic knee pain, not R knee  pain.  11/08/2021 - reports increased R knee pain and clicking today   Target Date 11/07/21      PT LONG TERM GOAL #5   Title Patient will demonstrate improved functional LE strength by completing 5xSTS < 14 seconds.    Baseline 24 seconds.    Time 8    Period Weeks    Status Achieved   10/28/21- 8.72 sec   Target Date 11/07/21      PT LONG TERM GOAL #6   Title Pt. will report no more than 2/10 R knee pain with activity.    Baseline 5-8/10 depending on activity    Time 8    Period Weeks    Status Not Met   10/25- 5/10 with bike  09/26/21- only 1-2/10 at rest, but still increases with activity.  Noted allodynia over R medial knee. 10/28/21- 3/10 R knee pain with activity  11/08/21- pain is worsening R knee   Target Date 11/07/21                    Patient will benefit from skilled therapeutic intervention in order to improve the following deficits and impairments:     Visit Diagnosis: Acute pain of right knee  Stiffness of right knee, not elsewhere classified  Other abnormalities of gait and mobility  Muscle weakness (generalized)  Localized edema     Problem List Patient Active Problem List   Diagnosis Date Noted   Arthrofibrosis of knee joint, right    Arthritis of right knee    Diabetic gastroparesis ([place]) 07/20/2021   Constipation 07/20/2021   S/P total knee arthroplasty, right 07/05/2021   Chronic diarrhea 04/29/2021   Belching 04/29/2021   Primary osteoarthritis of both knees 10/28/2020   Irritable bowel syndrome with diarrhea 10/16/2019   Tendinitis of right rotator cuff 09/17/2019   Hand pain, right 05/21/2019   Labral tear of hip joint 05/21/2019   Type 2 diabetes mellitus with diabetic neuropathy, unspecified ([place]) 10/01/2018   Right hip pain 05/22/2018   Tennis Must Quervain's disease (radial styloid tenosynovitis) 04/02/2018   Carpal tunnel syndrome, right upper limb 12/31/2017   Elbow injury, right, initial encounter 12/16/2017   Chronic pain of  both shoulders 11/30/2017   PTSD (post-traumatic stress disorder) 10/25/2016   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[place]) 10/25/2016   Diabetes mellitus ([place]) 10/25/2016   Major depressive disorder, recurrent episode, severe, with psychosis ([place]) 10/20/2016   Major depressive disorder, recurrent episode, severe, 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[place]) 10/19/2016   TIA (transient ischemic attack) 05/16/2016   Arterial ischemic stroke, MCA, left, acute ([place]) 03/29/2016   Plantar fasciitis 08/08/2013   Obesity, Class III, BMI 40-49.9 (morbid obesity) ([place]) 04/09/2013   Unspecified sleep apnea 03/31/2013   PCO (polycystic ovaries) 04/11/2012   Neck pain 11/21/2010   FATTY LIVER DISEASE 01/26/2010   NAUSEA WITH VOMITING 01/26/2010   PORTAL HYPERTENSION 01/26/2010   Migraine headache 06/28/2009   BREAST MASS 08/21/2008   KNEE PAIN 04/14/2008   Hypothyroidism 03/17/2008   Hyperlipidemia 03/17/2008   DEPRESSION 03/17/2008  NEUROPATHY 03/17/2008   Essential hypertension 03/17/2008   ASTHMA 03/17/2008   GERD 03/17/2008   Headache 03/17/2008    Rennie Natter, PT, DPT  11/08/2021, 10:34 AM  [facility] [address]  [place], [place], [address] Phone: [phone]   Fax:  [phone]  Name: Miranda [name] MRN: [id number] Date of Birth: Dec 17, 1975

## 2021-11-09 ENCOUNTER — Encounter: Payer: Self-pay | Admitting: Physical Therapy

## 2021-11-09 ENCOUNTER — Ambulatory Visit: Payer: BC Managed Care – PPO | Attending: Family Medicine | Admitting: Physical Therapy

## 2021-11-09 ENCOUNTER — Encounter: Payer: Self-pay | Admitting: Family Medicine

## 2021-11-09 DIAGNOSIS — G8929 Other chronic pain: Secondary | ICD-10-CM

## 2021-11-09 DIAGNOSIS — M25561 Pain in right knee: Secondary | ICD-10-CM

## 2021-11-09 DIAGNOSIS — R600 Localized edema: Secondary | ICD-10-CM | POA: Insufficient documentation

## 2021-11-09 DIAGNOSIS — M25562 Pain in left knee: Secondary | ICD-10-CM | POA: Insufficient documentation

## 2021-11-09 DIAGNOSIS — M25661 Stiffness of right knee, not elsewhere classified: Secondary | ICD-10-CM

## 2021-11-09 DIAGNOSIS — M6281 Muscle weakness (generalized): Secondary | ICD-10-CM | POA: Insufficient documentation

## 2021-11-09 DIAGNOSIS — R2689 Other abnormalities of gait and mobility: Secondary | ICD-10-CM | POA: Diagnosis present

## 2021-11-09 NOTE — Progress Notes (Signed)
PCP: Vonita Moss, NP  Subjective:   HPI: Patient is a 46 y.o. female here for bilateral knee pain.  Patient had right knee replacement 07/05/21 with manipulation on 08/22/21. She has continued to have popping and lateral, posterior pain in this right knee with some radiation down into right lower leg but no numbness. Done fairly extensive physical therapy and doing home exercises as well. Her motion is much better than it was though pain persists as above. She also has had left knee pain medially with known arthropathy here - not interested in injections again right now but would like to do physical therapy for this.  Past Medical History:  Diagnosis Date   Anginal pain ([place])    Anxiety    Arthritis    Asthma    Bowel obstruction ([place])    Cardiac arrhythmia    Depression    sees Dr. Matilde Haymaker in [place]    Diabetes mellitus    sees Dr. Madelin Rear    Dyspnea    Dysrhythmia    GERD (gastroesophageal reflux disease)    Hyperlipidemia    Hypertension    Hypothyroidism    sees Dr. Elyse Hsu   Insomnia    Migraine syndrome    sees Dr. 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 08/2017   Stroke ([place]) 03/25/2016   left MCA    Stroke ([place]) 04/2016   sees Dr. Teodora Medici at [facility]     Current Outpatient Medications on File Prior to Visit  Medication Sig Dispense Refill   acetaminophen (TYLENOL) 325 MG tablet Take 2 tablets (650 mg total) by mouth every 6 (six) hours as needed for mild pain (pain score 1-3 or temp > 100.5). 30 tablet 0   albuterol (PROAIR HFA) 108 (90 Base) MCG/ACT inhaler Inhale 2 puffs into the lungs every 4 (four) hours as needed for wheezing. 8.5 g 11   ARIPiprazole (ABILIFY) 30 MG tablet Take 30 mg by mouth every  evening.     baclofen (LIORESAL) 10 MG tablet Take 10 mg by mouth every 4 (four) hours as needed (headaches). (Patient not taking: Reported on 11/09/2021)     budesonide-formoterol (SYMBICORT) 160-4.5 MCG/ACT inhaler Inhale 2 puffs into the lungs 2 (two) times daily. (Patient taking differently: Inhale 2 puffs into the lungs 2 (two) times daily as needed (asthma).) 1 Inhaler 11   buPROPion (WELLBUTRIN XL) 150 MG 24 hr tablet Take 150-300 mg by mouth See admin instructions. 300 mg in the morning, 150 mg in the evening     clopidogrel (PLAVIX) 75 MG tablet TAKE ONE TABLET BY MOUTH DAILY (Patient taking differently: Take 75 mg by mouth daily.) 90 tablet 0   diclofenac Sodium (VOLTAREN) 1 % GEL Apply 1 application topically 2 (two) times daily as needed (pain).     diphenoxylate-atropine (LOMOTIL) 2.5-0.025 MG tablet Take 1 tablet by mouth every 6 (six) hours as needed for diarrhea or loose stools. (Patient taking differently: Take 2 tablets by mouth every 6 (six) hours as needed for diarrhea or loose stools.) 30 tablet 0   docusate sodium (COLACE) 100 MG capsule Take 1 capsule (100 mg total) by mouth 2 (two) times daily. 10 capsule 0   famotidine (  PEPCID) 40 MG tablet Take 40 mg by mouth 2 (two) times daily.     fluticasone (FLONASE) 50 MCG/ACT nasal spray Place 2 sprays into both nostrils daily as needed for allergies or rhinitis.     gabapentin (NEURONTIN) 400 MG capsule Take 1 capsule (400 mg total) by mouth 3 (three) times daily. 90 capsule 1   Glucagon (BAQSIMI ONE PACK) 3 MG/DOSE POWD Place 3 mg into the nose daily as needed (low blood sugar).     hydrOXYzine (VISTARIL) 50 MG capsule Take 50 mg by mouth 3 (three) times daily.     hyoscyamine (LEVSIN SL) 0.125 MG SL tablet Place 1 tablet (0.125 mg total) under the tongue every 6 (six) hours as needed. 30 tablet 1   ibuprofen (ADVIL) 200 MG tablet Take 800 mg by mouth in the morning and at bedtime.     insulin aspart (NOVOLOG) 100 UNIT/ML injection  Use in the insulin pump as directed 10 mL 0   levothyroxine (SYNTHROID, LEVOTHROID) 125 MCG tablet Take 1 tablet (125 mcg total) by mouth daily before breakfast. 90 tablet 3   lisinopril (ZESTRIL) 10 MG tablet Take 10 mg by mouth daily.     loratadine (CLARITIN) 10 MG tablet Take 10 mg by mouth daily as needed for allergies.     Menthol, Topical Analgesic, (BIOFREEZE) 10 % LIQD Apply 1 application topically 2 (two) times daily as needed (pain).     metoCLOPramide (REGLAN) 5 MG tablet Take 0.5 tablets (2.5 mg total) by mouth 4 (four) times daily -  before meals and at bedtime. 60 tablet 1   metoprolol succinate (TOPROL-XL) 25 MG 24 hr tablet Take 0.5 tablets (12.5 mg total) by mouth daily. (Patient taking differently: Take 25 mg by mouth daily.) 30 tablet 0   montelukast (SINGULAIR) 10 MG tablet Take 10 mg by mouth daily.     Multiple Vitamin (MULTIVITAMIN) tablet Take 1 tablet by mouth daily.     nystatin [facility]) powder Apply topically QID. (Patient taking differently: Apply 1 application topically 2 (two) times daily as needed (yeast/ dry skin).) 60 g 0   ondansetron (ZOFRAN-ODT) 4 MG disintegrating tablet Take 4 mg by mouth every 8 (eight) hours as needed for nausea or vomiting.     oxyCODONE (OXY IR/ROXICODONE) 5 MG immediate release tablet Take 1 tablet (5 mg total) by mouth every 6 (six) hours as needed for moderate pain (pain score 4-6). 30 tablet 0   OZEMPIC, 0.25 OR 0.5 MG/DOSE, 2 MG/1.5ML SOPN Inject 0.5 mg into the skin every Sunday.     pantoprazole (PROTONIX) 40 MG tablet Take 1 tablet (40 mg total) by mouth 2 (two) times daily. 180 tablet 2   polyethylene glycol powder (GLYCOLAX/MIRALAX) 17 GM/SCOOP powder Use 1 capful twice daily in 8 ounces of fluid 510 g 3   prazosin (MINIPRESS) 5 MG capsule Take 10 mg by mouth at bedtime.     promethazine (PHENERGAN) 25 MG tablet Take 1 tablet (25 mg total) by mouth every 4 (four) hours as needed for nausea or vomiting. (Patient taking differently:  Take 25 mg by mouth every 6 (six) hours as needed for nausea or vomiting.) 60 tablet 1   Rimegepant Sulfate (NURTEC) 75 MG TBDP Take 75 mg by mouth daily as needed (migraines).     rosuvastatin (CRESTOR) 20 MG tablet Take 1 tablet (20 mg total) by mouth daily. 30 tablet 6   terconazole (TERAZOL 7) 0.4 % vaginal cream Place 1 applicator vaginally at bedtime as  needed (yeast infections).     topiramate (TOPAMAX) 100 MG tablet Take 100 mg by mouth 2 (two) times daily.      traZODone (DESYREL) 150 MG tablet Take 150 mg by mouth at bedtime.     Ubrogepant (UBRELVY) 100 MG TABS Take 100 mg by mouth daily as needed (migraines). May repeat in 2 hours as needed. No more than 200 mg in 24 hours     valACYclovir (VALTREX) 1000 MG tablet Take 1 tablet (1,000 mg total) by mouth 3 (three) times daily. (Patient taking differently: Take 1,000 mg by mouth 2 (two) times daily as needed (breakouts).) 30 tablet 0   No current facility-administered medications on file prior to visit.    Past Surgical History:  Procedure Laterality Date   blocked itestinal repair  84   age 18 months   BREAST BIOPSY Left 2017   CHOLECYSTECTOMY  2011   INDUCED ABORTION  1996   forced abortion   INGUINAL HERNIA REPAIR Left 1980   age 1   KNEE CLOSED REDUCTION Right 08/22/2021   Procedure: RIGHT  KNEE MANIPULATION UNDER ANESTHESIA;  Surgeon: Meredith Pel, MD;  Location: [place];  Service: Orthopedics;  Laterality: Right;   LAPAROSCOPIC ENDOMETRIOSIS FULGURATION  1998   TOTAL KNEE ARTHROPLASTY Right 07/05/2021   Procedure: TOTAL KNEE ARTHROPLASTY;  Surgeon: Meredith Pel, MD;  Location: [place];  Service: Orthopedics;  Laterality: Right;   WISDOM TOOTH EXTRACTION  2007    Allergies  Allergen Reactions   Macrobid [Nitrofurantoin] Hives   Metformin And Related Other (See Comments)    Abdominal cramping    [place] 10 Mcg Pen [Exenatide] Hives   Clindamycin/Lincomycin Nausea And Vomiting   Geodon  [Ziprasidone Hcl]     Body starts shutting down   Lipitor [Atorvastatin]     Per patient this causes cramps   Nsaids     Heartburn   Penicillins Other (See Comments)    Childhood reaction Has patient had a PCN reaction causing immediate rash, facial/tongue/throat swelling, SOB or lightheadedness with hypotension: No Has patient had a PCN reaction causing severe rash involving mucus membranes or skin necrosis: No Has patient had a PCN reaction that required hospitalization No Has patient had a PCN reaction occurring within the last 10 years: No If all of the above answers are "NO", then may proceed with Cephalosporin use.    Trulicity [Dulaglutide]     Abdominal pain    Avocado Diarrhea and Other (See Comments)    Severe cramping, sweats, and diarrhea in upper GI/stomach   Tramadol Hcl Itching and Rash    BP 112/68    Ht 5' 1.75" (1.568 m)    Wt 206 lb (93.4 kg)    BMI 37.98 kg/m   No flowsheet data found.  No flowsheet data found.      Objective:  Physical Exam:  Gen: NAD, comfortable in exam room  Right knee: Well healed TKR scar.  Mild effusion.  No other gross deformity, ecchymoses. Mild lateral TTP including into peroneal muscles. FROM with normal strength flexion and extension.  Normal ankle dorsiflexion strength. Negative valgus/varus testing.  NV intact distally. Negative tinels fibular head.  Left knee: No gross deformity, ecchymoses, swelling. TTP medial joint line. FROM with normal strength. Negative ant/post drawers. Negative valgus/varus testing. Negative lachman.  Negative mcmurrays, apleys.  NV intact distally.   Assessment & Plan:  1. Right knee pain - s/p TKR on 9/6.  Will repeat radiographs to ensure no  loosening of prosthesis but would be unlikely at this point (she did have a fall since last radiographs though).  Likely some peroneal nerve irritation but no foot drop - reassured, continue gabapentin.  Physical therapy.  Believe this will improve  with more time to heal.  2. Left knee pain - known arthritis medially.  Physical therapy.  Consider repeat injections in the future.

## 2021-11-09 NOTE — Therapy (Signed)
[facility] [address]  [place], [place], [address] Phone: [phone]   Fax:  [phone]  Physical Therapy Evaluation  Patient Details  Name: Miranda [name] MRN: [id number] Date of Birth: May 08, 1976 Referring Provider (PT): Dene Gentry   Encounter Date: 11/09/2021   PT End of Session - 11/09/21 1203     Visit Number 1    Number of Visits 12    Date for PT Re-Evaluation 12/21/21    Authorization Type BCBS    Authorization - Number of Visits 22    PT Start Time 1105    PT Stop Time 1150    PT Time Calculation (min) 45 min    Activity Tolerance Patient tolerated treatment well    Behavior During Therapy [facility] for tasks assessed/performed             Past Medical History:  Diagnosis Date   Anginal pain ([place])    Anxiety    Arthritis    Asthma    Bowel obstruction ([place])    Cardiac arrhythmia    Depression    sees Dr. Matilde Haymaker in [place]    Diabetes mellitus    sees Dr. Madelin Rear    Dyspnea    Dysrhythmia    GERD (gastroesophageal reflux disease)    Hyperlipidemia    Hypertension    Hypothyroidism    sees Dr. Elyse Hsu   Insomnia    Migraine syndrome    sees Dr. 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 08/2017   Stroke ([place]) 03/25/2016   left MCA    Stroke [facility]) 04/2016   se[facility] Teodora Medici at [facility]     Past Surgical History:  Procedure Laterality Date   blocked itestinal repair  1975   age 34 months   BREAST BIOPSY Left 2017   CHOLECYSTECTOMY  2011   INDUCED ABORTION  1996   forced abortion   INGUINAL HERNIA REPAIR Left 1980   age 41   KNEE CLOSED REDUCTION Right 08/22/2021   Procedure: RIGHT  KNEE MANIPULATION UNDER ANESTHESIA;   Surgeon: Meredith Pel, MD;  Location: [place];  Service: Orthopedics;  Laterality: Right;   LAPAROSCOPIC ENDOMETRIOSIS FULGURATION  1998   TOTAL KNEE ARTHROPLASTY Right 07/05/2021   Procedure: TOTAL KNEE ARTHROPLASTY;  Surgeon: Meredith Pel, MD;  Location: [place];  Service: Orthopedics;  Laterality: Right;   WISDOM TOOTH EXTRACTION  2007    There were no vitals filed for this visit.    Subjective Assessment - 11/09/21 1108     Subjective Pt. has chronic L knee pain and R knee pain.  She had been receiving PT after her R TKR, and while she feels she has gained full function back in her R knee, she still has a pain that has worsened lately.  She also fell yesterday, stepping down with L foot twisting it then fell down 2 steps landing on L knee, felt all her muscles pull.  She has severe L knee OA, and plans to get it replaced in the future, but would like to be stronger before surgery.  Reports history of L hip labral tear as well, but  feels her hip pain is more from the knee than the hip.    Pertinent History R TKR- 07/05/2021, depression, PTSD,migraine/ TIA (loss of sensation on L side), L ischemic stroke, T2DM, irregular heartbeat, gastroparesis, IBS, asthma, GERD    Limitations Walking;Standing;House hold activities    How long can you sit comfortably? gets still over time, needs to get up every hour    How long can you stand comfortably? 30 minutes    How long can you walk comfortably? max 45 min to 1 hour, "hobbling after"    Diagnostic tests X-ray- 11/08/21- Right knee replacement with intact hardware and normal alignment. No  fracture is seen. Probable suprapatellar effusion.    Patient Stated Goals want to strengthen L knee before has knee replacement, decrease pain both knees    Currently in Pain? Yes    Pain Score 4    worst 6-7/10 after fall   Pain Location Knee    Pain Orientation Left    Pain Descriptors / Indicators Sharp;Shooting    Pain Type Chronic  pain    Pain Radiating Towards mostly localized anterior knee but does radiate towards foot    Pain Onset More than a month ago    Pain Frequency Intermittent    Aggravating Factors  standing up from sitting, straightening knee, getting out of bed (transitions), stairs    Pain Relieving Factors rest, heat    Effect of Pain on Daily Activities difficulty with ADLs    Multiple Pain Sites Yes    Pain Score 5    Pain Location Knee    Pain Orientation Right    Pain Descriptors / Indicators Aching;Sharp;Shooting    Pain Type Chronic pain    Pain Radiating Towards ankle to thigh even into hip    Pain Onset More than a month ago   surgery september 2022   Pain Frequency Constant    Aggravating Factors  straightening knee, stairs    Pain Relieving Factors heat, rest, walking more    Effect of Pain on Daily Activities difficulty with ADLs, stiars                [facility] PT Assessment - 11/09/21 0001       Assessment   Medical Diagnosis M25.561,M25.562,G89.29 (ICD-10-CM) - Bilateral chronic knee pain    Referring Provider (PT) Hudnall, Shane R.    Onset Date/Surgical Date 07/05/21    Prior Therapy 32 visits after R TKR      Precautions   Precautions None      Restrictions   Weight Bearing Restrictions No      Balance Screen   Has the patient fallen in the past 6 months Yes    How many times? 1    Has the patient had a decrease in activity level because of a fear of falling?  No    Is the patient reluctant to leave their home because of a fear of falling?  No      Home Environment   Living Environment Private residence    Living Arrangements Non-relatives/Friends    Type of [place] to enter    Entrance Stairs-Number of Steps 5    Entrance Stairs-Rails Right;Left;Cannot reach both    [place] to live on main level with bedroom/bathroom      Prior Function   Level of Independence Independent      Cognition   Overall Cognitive Status Within  Functional Limits for  tasks assessed      Observation/Other Assessments   Observations Pt. enters indepenendently in no apparent distress.    Focus on Therapeutic Outcomes (FOTO)  knee 53% (risk adjusted 33%) 57% predicted      Sit to Stand   Comments 13.5 seconds - increased knee pain (R>L)      ROM / Strength   AROM / PROM / Strength Strength;AROM      AROM   Right/Left Knee Right;Left    Right Knee Extension 2    Right Knee Flexion 113    Left Knee Extension 0    Left Knee Flexion 120      Strength   Right Hip Flexion 4+/5    Right Hip ABduction 5/5    Right Hip ADduction 4+/5    Left Hip Flexion 5/5    Left Hip ABduction 5/5    Left Hip ADduction 4+/5    Right Knee Flexion 5/5    Right Knee Extension 4+/5   painful   Left Knee Flexion 5/5    Left Knee Extension 5/5      Palpation   Patella mobility decreased mobility L knee    Palpation comment tenderness L knee patellar ligament and pes anserine.  Tenderness R knee patellar ligment, lateral knee/fibular head      Special Tests    Special Tests Knee Special Tests    Other special tests no instability with anterior and posterior drawer tests, knee varus and valgus tests      Ambulation/Gait   Ambulation/Gait Yes    Ambulation/Gait Assistance 7: Independent    Ambulation Distance (Feet) 825 Feet    Gait Pattern Step-through pattern;Antalgic   L knee pain   Ambulation Surface Level      6 minute walk test results    Aerobic Endurance Distance Walked 825    Endurance additional comments limited by L knee pain more than endurance                        Objective measurements completed on examination: See above findings.                  PT Short Term Goals - 11/09/21 1314       PT SHORT TERM GOAL #1   Title Ind with initial HEP    Time 2    Period Weeks    Status New    Target Date 11/23/21      PT SHORT TERM GOAL #2   Title Patient will complete balance screen to assess for  any balance deficits    Time 2    Period Weeks    Status New    Target Date 11/23/21               PT Long Term Goals - 11/09/21 1315       PT LONG TERM GOAL #1   Title Ind with progressed HEP for LE strengthening/gait    Time 6    Period Weeks    Status New    Target Date 12/21/21      PT LONG TERM GOAL #2   Title Pt. will demonstrate no more than 3/10 bil knee pain with transitions and stairs.    Baseline sharp shooting pains L knee, 6/10 R knee    Time 6    Period Weeks    Status New    Target Date 12/21/21      PT LONG TERM  GOAL #3   Title Pt. will demonstrate improved functional strength by completeing 10x STS in < 20 seconds    Baseline 5x STS in 13 seconds, increased bil knee pain    Time 6    Period Weeks    Status New    Target Date 12/21/21      PT LONG TERM GOAL #4   Title Pt. will be able to ambulate 1000' without increased bil knee pain to access community.    Baseline 825' in 6 min with increased bil knee pain    Time 6    Period Weeks    Status New    Target Date 12/21/21                    Plan - 11/09/21 1309     Clinical Impression Statement Pt. is a 46 year old female referred for bilateral knee pain, with severe L knee OA and recent R TKR.  She had a fall yesterday, but no instability found in either knee, and reports muscular pain has improved since the fall.  She demonstrates good bil knee ROM, but pain with stairs and transitional movements, and decreased tolerance to exercise due to L knee pain.  Today her 5xSTS was 13.5 seconds, below average for age, and she was only able to walk 825' in 6 minutes, which is also significantly less than typical for age, and reporting increased L knee pain with walking.  She would benefit from skilled physical therapy to decrease pain, improve functional strength and endurance, and improve functional mobility.    Personal Factors and Comorbidities Comorbidity 3+    Comorbidities R TKR, depression,  PTSD, obesity, TIA/migraine, T2DM, hypertension, HT, gastroparesis, IBS, asthma, GERD, L knee OA, history ischemic stroke, heart irregularities    Examination-Activity Limitations Bend;Carry;Stairs;Stand;Transfers;Sleep;Locomotion Level;Squat    Examination-Participation Restrictions Cleaning;Community Activity;Occupation;Shop    Stability/Clinical Decision Making Evolving/Moderate complexity    Clinical Decision Making Moderate    Rehab Potential Good    PT Frequency 2x / week    PT Duration 6 weeks    PT Treatment/Interventions ADLs/Self Care Home Management;Cryotherapy;Electrical Stimulation;Iontophoresis 4mg /ml Dexamethasone;Moist Heat;Ultrasound;Gait training;Stair training;Functional mobility training;Therapeutic activities;Therapeutic exercise;Balance training;Neuromuscular re-education;Patient/family education;Manual techniques;Scar mobilization;Passive range of motion;Dry needling;Taping;Vasopneumatic Device;Joint Manipulations    PT Next Visit Plan check balance (mCTSIB & Berg), bil hip/knee strengthening, modalities and manual to bil knees as indicated    Consulted and Agree with Plan of Care Patient             Patient will benefit from skilled therapeutic intervention in order to improve the following deficits and impairments:  Abnormal gait, Decreased activity tolerance, Decreased endurance, Decreased range of motion, Decreased skin integrity, Decreased strength, Hypomobility, Increased fascial restricitons, Impaired sensation, Pain, Decreased mobility, Decreased scar mobility, Difficulty walking, Increased edema, Increased muscle spasms, Impaired flexibility  Visit Diagnosis: Acute pain of right knee  Stiffness of right knee, not elsewhere classified  Chronic pain of left knee  Other abnormalities of gait and mobility     Problem List Patient Active Problem List   Diagnosis Date Noted   Arthrofibrosis of knee joint, right    Arthritis of right knee    Diabetic  gastroparesis ([place]) 07/20/2021   Constipation 07/20/2021   S/P total knee arthroplasty, right 07/05/2021   Chronic diarrhea 04/29/2021   Belching 04/29/2021   Primary osteoarthritis of both knees 10/28/2020   Irritable bowel syndrome with diarrhea 10/16/2019   Tendinitis of right rotator cuff 09/17/2019   Hand  pain, right 05/21/2019   Labral tear of hip joint 05/21/2019   Type 2 diabetes mellitus with diabetic neuropathy, unspecified ([place]) 10/01/2018   Right hip pain 05/22/2018   Harriet Pho disease (radial styloid tenosynovitis) 04/02/2018   Carpal tunnel syndrome, right upper limb 12/31/2017   Elbow injury, right, initial encounter 12/16/2017   Chronic pain of both shoulders 11/30/2017   PTSD (post-traumatic stress disorder) 10/25/2016   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/2016   Diabetes mellitus ([place]) 10/25/2016   Major depressive disorder, recurrent episode, severe, with psychosis ([place]) 10/20/2016   Major depressive disorder, recurrent episode, severe, 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[place]) 10/19/2016   TIA (transient ischemic attack) 05/16/2016   Arterial ischemic stroke, MCA, left, acute ([place]) 03/29/2016   Plantar fasciitis 08/08/2013   Obesity, Class III, BMI 40-49.9 (morbid obesity) ([place]) 04/09/2013   Unspecified sleep apnea 03/31/2013   PCO (polycystic ovaries) 04/11/2012   Neck pain 11/21/2010   FATTY LIVER DISEASE 01/26/2010   NAUSEA WITH VOMITING 01/26/2010   PORTAL HYPERTENSION 01/26/2010   Migraine headache 06/28/2009   BREAST MASS 08/21/2008   KNEE PAIN 04/14/2008   Hypothyroidism 03/17/2008   Hyperlipidemia 03/17/2008   DEPRESSION 03/17/2008   NEUROPATHY 03/17/2008   Essential hypertension 03/17/2008   ASTHMA 03/17/2008   GERD 03/17/2008   Headache 03/17/2008    Rennie Natter, PT, DPT  11/09/2021, 1:29 PM  [facility] [address]  [place], [place], [address] Phone: [phone]    Fax:  [phone]  Name: Miranda [name] MRN: [id number] Date of Birth: [birth year]

## 2021-11-15 ENCOUNTER — Other Ambulatory Visit: Payer: Self-pay

## 2021-11-15 ENCOUNTER — Ambulatory Visit: Payer: BC Managed Care – PPO

## 2021-11-15 DIAGNOSIS — M25561 Pain in right knee: Secondary | ICD-10-CM

## 2021-11-15 DIAGNOSIS — R2689 Other abnormalities of gait and mobility: Secondary | ICD-10-CM

## 2021-11-15 DIAGNOSIS — M25661 Stiffness of right knee, not elsewhere classified: Secondary | ICD-10-CM

## 2021-11-15 DIAGNOSIS — G8929 Other chronic pain: Secondary | ICD-10-CM

## 2021-11-15 DIAGNOSIS — M25562 Pain in left knee: Secondary | ICD-10-CM

## 2021-11-15 NOTE — Therapy (Signed)
[facility] [address]  [place], [place], [address] Phone: [phone]   Fax:  [phone]  Physical Therapy Treatment  Patient Details  Name: Miranda [name] MRN: [id number] Date of Birth: 1976/08/18 Referring Provider (PT): Dene Gentry   Encounter Date: 11/15/2021   PT End of Session - 11/15/21 1515     Visit Number 2    Number of Visits 12    Date for PT Re-Evaluation 12/21/21    Authorization Type BCBS    Authorization - Number of Visits 22    PT Start Time 1319    PT Stop Time 1359    PT Time Calculation (min) 40 min    Activity Tolerance Patient tolerated treatment well    Behavior During Therapy [facility] for tasks assessed/performed             Past Medical History:  Diagnosis Date   Anginal pain ([place])    Anxiety    Arthritis    Asthma    Bowel obstruction ([place])    Cardiac arrhythmia    Depression    sees Dr. Matilde Haymaker in [place]    Diabetes mellitus    sees Dr. Madelin Rear    Dyspnea    Dysrhythmia    GERD (gastroesophageal reflux disease)    Hyperlipidemia    Hypertension    Hypothyroidism    sees Dr. Elyse Hsu   Insomnia    Migraine syndrome    sees Dr. 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 08/2017   Stroke ([place]) 03/25/2016   left MCA    Stroke [facility]) 04/2016   sees Dr. Teodora Medici at [name]     Past Surgical History:  Procedure Laterality Date   blocked itestinal repair  1975   age 21 months   BREAST BIOPSY Left 2017   CHOLECYSTECTOMY  2011   INDUCED ABORTION  1996   forced abortion   INGUINAL HERNIA REPAIR Left 1980   age 78   KNEE CLOSED REDUCTION Right 08/22/2021   Procedure: RIGHT  KNEE MANIPULATION UNDER ANESTHESIA;   Surgeon: Meredith Pel, MD;  Location: [place];  Service: Orthopedics;  Laterality: Right;   LAPAROSCOPIC ENDOMETRIOSIS FULGURATION  1998   TOTAL KNEE ARTHROPLASTY Right 07/05/2021   Procedure: TOTAL KNEE ARTHROPLASTY;  Surgeon: Meredith Pel, MD;  Location: [place];  Service: Orthopedics;  Laterality: Right;   WISDOM TOOTH EXTRACTION  2007    There were no vitals filed for this visit.   Subjective Assessment - 11/15/21 1321     Subjective Pt reports that her 18 lb cat has been sleeping on on her R leg for a while, which her doctor said that it could have been some nerve compression causin more pain.    Pertinent History R TKR- 07/05/2021, depression, PTSD,migraine/ TIA (loss of sensation on L side), L ischemic stroke, T2DM, irregular heartbeat, gastroparesis, IBS, asthma, GERD    Diagnostic tests X-ray- 11/08/21- Right knee replacement with intact hardware and normal alignment. No  fracture is seen. Probable suprapatellar effusion.    Patient Stated Goals want to strengthen L knee before has knee replacement, decrease pain both knees  Currently in Pain? Yes    Pain Score 5     Pain Location Knee    Pain Orientation Left    Pain Descriptors / Indicators Sharp;Shooting    Pain Type Chronic pain    Multiple Pain Sites Yes    Pain Score 0   2 or 3/10 pain when walking   Pain Location Knee    Pain Orientation Left    Pain Descriptors / Indicators Aching;Sharp;Shooting    Pain Type Chronic pain                               [facility] Adult PT Treatment/Exercise - 11/15/21 0001       Knee/Hip Exercises: Aerobic   Recumbent Bike L2x74min      Knee/Hip Exercises: Machines for Strengthening   Cybex Leg Press 15lb R/L 10 reps      Knee/Hip Exercises: Standing   Other Standing Knee Exercises single leg RDL 8 reps      Knee/Hip Exercises: Seated   Ball Squeeze 15x5"      Knee/Hip Exercises: Supine   Bridges Strengthening;2 sets;10 reps     Straight Leg Raises Strengthening;Both;2 sets;10 reps    Other Supine Knee/Hip Exercises single leg clams 10x with GTB    Other Supine Knee/Hip Exercises marches with GTB 10x                     PT Education - 11/15/21 1514     Education Details Updated HEP    Person(s) Educated Patient    Methods Explanation;Demonstration    Comprehension Verbalized understanding;Returned demonstration              PT Short Term Goals - 11/15/21 1520       PT SHORT TERM GOAL #1   Title Ind with initial HEP    Time 2    Period Weeks    Status On-going    Target Date 11/23/21      PT SHORT TERM GOAL #2   Title Patient will complete balance screen to assess for any balance deficits    Time 2    Period Weeks    Status On-going    Target Date 11/23/21               PT Long Term Goals - 11/15/21 1520       PT LONG TERM GOAL #1   Title Ind with progressed HEP for LE strengthening/gait    Time 6    Period Weeks    Status On-going    Target Date 12/21/21      PT LONG TERM GOAL #2   Title Pt. will demonstrate no more than 3/10 bil knee pain with transitions and stairs.    Baseline sharp shooting pains L knee, 6/10 R knee    Time 6    Period Weeks    Status On-going    Target Date 12/21/21      PT LONG TERM GOAL #3   Title Pt. will demonstrate improved functional strength by completeing 10x STS in < 20 seconds    Baseline 5x STS in 13 seconds, increased bil knee pain    Time 6    Period Weeks    Status On-going    Target Date 12/21/21      PT LONG TERM GOAL #4   Title Pt. will be able to ambulate 1000' without increased bil knee pain to  access community.    Baseline 825' in 6 min with increased bil knee pain    Time 6    Period Weeks    Status On-going    Target Date 12/21/21                   Plan - 11/15/21 1408     Clinical Impression Statement Focused session on proximal LE strengthening. Started with simple supine exercises for comfort.  Pt was able to progress through session just fine, with cuing given throughout the session. Some LOB noticed with the single leg RDLs. She also demonstrated greater L hip weakness than the R today.    Personal Factors and Comorbidities Comorbidity 3+    Comorbidities R TKR, depression, PTSD, obesity, TIA/migraine, T2DM, hypertension, HT, gastroparesis, IBS, asthma, GERD, L knee OA, history ischemic stroke, heart irregularities    PT Frequency 2x / week    PT Duration 6 weeks    PT Treatment/Interventions ADLs/Self Care Home Management;Cryotherapy;Electrical Stimulation;Iontophoresis 4mg /ml Dexamethasone;Moist Heat;Ultrasound;Gait training;Stair training;Functional mobility training;Therapeutic activities;Therapeutic exercise;Balance training;Neuromuscular re-education;Patient/family education;Manual techniques;Scar mobilization;Passive range of motion;Dry needling;Taping;Vasopneumatic Device;Joint Manipulations    PT Next Visit Plan check balance (mCTSIB & Berg), bil hip/knee strengthening, modalities and manual to bil knees as indicated    PT Home Exercise Plan Access Code: [email or web address]    Consulted and Agree with Plan of Care Patient             Patient will benefit from skilled therapeutic intervention in order to improve the following deficits and impairments:  Abnormal gait, Decreased activity tolerance, Decreased endurance, Decreased range of motion, Decreased skin integrity, Decreased strength, Hypomobility, Increased fascial restricitons, Impaired sensation, Pain, Decreased mobility, Decreased scar mobility, Difficulty walking, Increased edema, Increased muscle spasms, Impaired flexibility  Visit Diagnosis: Acute pain of right knee  Stiffness of right knee, not elsewhere classified  Chronic pain of left knee  Other abnormalities of gait and mobility     Problem List Patient Active Problem List   Diagnosis Date Noted   Arthrofibrosis of knee joint, right    Arthritis of right  knee    Diabetic gastroparesis ([place]) 07/20/2021   Constipation 07/20/2021   S/P total knee arthroplasty, right 07/05/2021   Chronic diarrhea 04/29/2021   Belching 04/29/2021   Primary osteoarthritis of both knees 10/28/2020   Irritable bowel syndrome with diarrhea 10/16/2019   Tendinitis of right rotator cuff 09/17/2019   Hand pain, right 05/21/2019   Labral tear of hip joint 05/21/2019   Type 2 diabetes mellitus with diabetic neuropathy, unspecified ([place]) 10/01/2018   Right hip pain 05/22/2018   Tennis Must Quervain's disease (radial styloid tenosynovitis) 04/02/2018   Carpal tunnel syndrome, right upper limb 12/31/2017   Elbow injury, right, initial encounter 12/16/2017   Chronic pain of both shoulders 11/30/2017   PTSD (post-traumatic stress disorder) 10/25/2016   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[place]) 10/25/2016   Diabetes mellitus ([place]) 10/25/2016   Major depressive disorder, recurrent episode, severe, with psychosis ([place]) 10/20/2016   Major depressive disorder, recurrent episode, severe, 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[place]) 10/19/2016   TIA (transient ischemic attack) 05/16/2016   Arterial ischemic stroke, MCA, left, acute ([place]) 03/29/2016   Plantar fasciitis 08/08/2013   Obesity, Class III, BMI 40-49.9 (morbid obesity) ([place]) 04/09/2013   Unspecified sleep apnea 03/31/2013   PCO (polycystic ovaries) 04/11/2012   Neck pain 11/21/2010   FATTY LIVER DISEASE 01/26/2010   NAUSEA WITH VOMITING 01/26/2010   PORTAL HYPERTENSION 01/26/2010   Migraine headache 06/28/2009   BREAST MASS  08/21/2008   KNEE PAIN 04/14/2008   Hypothyroidism 03/17/2008   Hyperlipidemia 03/17/2008   DEPRESSION 03/17/2008   NEUROPATHY 03/17/2008   Essential hypertension 03/17/2008   ASTHMA 03/17/2008   GERD 03/17/2008   Headache 03/17/2008    Artist Pais, PTA 11/15/2021, 3:20 PM  [facility] [address]  [place], [place], [address] Phone:  [phone]   Fax:  [phone]  Name: Miranda [name] MRN: [id number] Date of Birth: [birth year]

## 2021-11-15 NOTE — Patient Instructions (Signed)
Access Code: [email or web address] URL: [email or web address] Date: 11/15/2021 Prepared by: Clarene Essex  Exercises Backward Weight Shift and Opposite Arm Raise with Walker - 3 x daily - 7 x weekly - 2 sets - 10 reps 4 Way Patellar Glide - 2 x daily - 7 x weekly - 1 sets - 5 reps Prone Knee Extension with Ankle Weight - 1 x daily - 7 x weekly - 5 min hold Staggered Sit-to-Stand - 3 x daily - 7 x weekly - 1 sets - 10 reps Supine Bridge - 1 x daily - 7 x weekly - 3 sets - 10 reps Hooklying Isometric Clamshell - 1 x daily - 7 x weekly - 2 sets - 10 reps

## 2021-11-17 ENCOUNTER — Ambulatory Visit: Payer: BC Managed Care – PPO | Admitting: Physical Therapy

## 2021-11-17 ENCOUNTER — Encounter: Payer: Self-pay | Admitting: Physical Therapy

## 2021-11-17 ENCOUNTER — Other Ambulatory Visit: Payer: Self-pay

## 2021-11-17 DIAGNOSIS — M6281 Muscle weakness (generalized): Secondary | ICD-10-CM

## 2021-11-17 DIAGNOSIS — R600 Localized edema: Secondary | ICD-10-CM

## 2021-11-17 DIAGNOSIS — M25561 Pain in right knee: Secondary | ICD-10-CM

## 2021-11-17 DIAGNOSIS — G8929 Other chronic pain: Secondary | ICD-10-CM

## 2021-11-17 DIAGNOSIS — R2689 Other abnormalities of gait and mobility: Secondary | ICD-10-CM

## 2021-11-17 DIAGNOSIS — M25661 Stiffness of right knee, not elsewhere classified: Secondary | ICD-10-CM

## 2021-11-17 NOTE — Therapy (Signed)
Miranda [name] [address]  [place], [place], [address] Phone: [phone]   Fax:  [phone]  Physical Therapy Treatment  Patient Details  Name: Miranda [name] MRN: [id number] Date of Birth: 1976-05-09 Referring Provider (PT): Dene Gentry   Encounter Date: 11/17/2021   PT End of Session - 11/17/21 1025     Visit Number 3    Number of Visits 12    Date for PT Re-Evaluation 12/21/21    Authorization Type BCBS    Authorization - Number of Visits 22    Progress Note Due on Visit 30    PT Start Time 1024    PT Stop Time 1100    PT Time Calculation (min) 36 min    Activity Tolerance Patient tolerated treatment well    Behavior During Therapy [facility] for tasks assessed/performed             Past Medical History:  Diagnosis Date   Anginal pain ([place])    Anxiety    Arthritis    Asthma    Bowel obstruction ([place])    Cardiac arrhythmia    Depression    sees Dr. Matilde Haymaker in [place]    Diabetes mellitus    sees Dr. Madelin Rear    Dyspnea    Dysrhythmia    GERD (gastroesophageal reflux disease)    Hyperlipidemia    Hypertension    Hypothyroidism    sees Dr. Elyse Hsu   Insomnia    Migraine syndrome    sees Dr. 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 08/2017   Stroke ([place]) 03/25/2016   left MCA    Stroke [facility]) 04/2016   se[facility] Teodora Medici at [facility]     Past Surgical History:  Procedure Laterality Date   blocked itestinal repair  1975   age 62 months   BREAST BIOPSY Left 2017   CHOLECYSTECTOMY  2011   INDUCED ABORTION  1996   forced abortion   INGUINAL HERNIA REPAIR Left 1980   age 1   KNEE CLOSED REDUCTION Right 08/22/2021   Procedure: RIGHT  KNEE  MANIPULATION UNDER ANESTHESIA;  Surgeon: Meredith Pel, MD;  Location: [facility];  Service: Orthopedics;  Laterality: Right;   LAPAROSCOPIC ENDOMETRIOSIS FULGURATION  1998   TOTAL KNEE ARTHROPLASTY Right 07/05/2021   Procedure: TOTAL KNEE ARTHROPLASTY;  Surgeon: Meredith Pel, MD;  Location: [facility];  Service: Orthopedics;  Laterality: Right;   WISDOM TOOTH EXTRACTION  2007    There were no vitals filed for this visit.   Subjective Assessment - 11/17/21 1027     Subjective right knee is a little stiff today    Pertinent History R TKR- 07/05/2021, depression, PTSD,migraine/ TIA (loss of sensation on L side), L ischemic stroke, T2DM, irregular heartbeat, gastroparesis, IBS, asthma, GERD    Patient Stated Goals want to strengthen L knee before has knee replacement, decrease pain both knees    Currently in Pain? No/denies                [facility] PT Assessment - 11/17/21 0001       Standardized Balance Assessment   Standardized Balance Assessment Merrilee Jansky  Balance Test      Berg Balance Test   Sit to Stand Able to stand without using hands and stabilize independently    Standing Unsupported Able to stand safely 2 minutes    Sitting with Back Unsupported but Feet Supported on Floor or Stool Able to sit safely and securely 2 minutes    Stand to Sit Sits safely with minimal use of hands    Transfers Able to transfer safely, minor use of hands    Standing Unsupported with Eyes Closed Able to stand 10 seconds safely    Standing Unsupported with Feet Together Able to place feet together independently and stand 1 minute safely    From Standing, Reach Forward with Outstretched Arm Can reach confidently >25 cm (10")    From Standing Position, Pick up Object from Floor Able to pick up shoe safely and easily    From Standing Position, Turn to Look Behind Over each Shoulder Looks behind from both sides and weight shifts well    Turn 360 Degrees Able to turn 360 degrees safely one  side only in 4 seconds or less    Standing Unsupported, Alternately Place Feet on Step/Stool Able to complete 4 steps without aid or supervision    Standing Unsupported, One Foot in Front Able to place foot tandem independently and hold 30 seconds    Standing on One Leg Able to lift leg independently and hold > 10 seconds    Total Score 53                           [facility] Adult PT Treatment/Exercise - 11/17/21 0001       Knee/Hip Exercises: Aerobic   Nustep L6 x 6      Knee/Hip Exercises: Machines for Strengthening   Cybex Knee Extension Bil 15# 1x10 hurting today so went lighter; 10# unilateral x 10 ea    Cybex Knee Flexion Bil 20# x10; 10# left x 10 (5# too hard for right knee)    Cybex Leg Press Bil 15# x 10; then R/L x 10 ea                       PT Short Term Goals - 11/17/21 1108       PT SHORT TERM GOAL #2   Title Patient will complete balance screen to assess for any balance deficits    Status Achieved               PT Long Term Goals - 11/15/21 1520       PT LONG TERM GOAL #1   Title Ind with progressed HEP for LE strengthening/gait    Time 6    Period Weeks    Status On-going    Target Date 12/21/21      PT LONG TERM GOAL #2   Title Pt. will demonstrate no more than 3/10 bil knee pain with transitions and stairs.    Baseline sharp shooting pains L knee, 6/10 R knee    Time 6    Period Weeks    Status On-going    Target Date 12/21/21      PT LONG TERM GOAL #3   Title Pt. will demonstrate improved functional strength by completeing 10x STS in < 20 seconds    Baseline 5x STS in 13 seconds, increased bil knee pain    Time 6    Period Weeks    Status  On-going    Target Date 12/21/21      PT LONG TERM GOAL #4   Title Pt. will be able to ambulate 1000' without increased bil knee pain to access community.    Baseline 825' in 6 min with increased bil knee pain    Time 6    Period Weeks    Status On-going    Target Date  12/21/21                   Plan - 11/17/21 1057     Clinical Impression Statement Patiient was 9 min late today. BERG completed today with score of 53/56. [name] had some unsteadiness with intial toe taps to 4 inch step, but improved as she went on. She also had one incidence of LOB with turning in a full circle to the right. No LOB when going to left. Semi tandem stance was mildly challenging with right leg behind. She will benefit from skilled PT to addres these deficits. Some increased pain today in right knee so decreased weight on some exercises.    Comorbidities R TKR, depression, PTSD, obesity, TIA/migraine, T2DM, hypertension, HT, gastroparesis, IBS, asthma, GERD, L knee OA, history ischemic stroke, heart irregularities    PT Frequency 2x / week    PT Duration 6 weeks    PT Treatment/Interventions ADLs/Self Care Home Management;Cryotherapy;Electrical Stimulation;Iontophoresis 4mg /ml Dexamethasone;Moist Heat;Ultrasound;Gait training;Stair training;Functional mobility training;Therapeutic activities;Therapeutic exercise;Balance training;Neuromuscular re-education;Patient/family education;Manual techniques;Scar mobilization;Passive range of motion;Dry needling;Taping;Vasopneumatic Device;Joint Manipulations    PT Next Visit Plan check balance (mCTSIB), bil hip/knee strengthening, higher level balance activities, low squats for help with toilet transfers,  modalities and manual to bil knees as indicated    PT Home Exercise Plan Access Code: [email or web address]             Patient will benefit from skilled therapeutic intervention in order to improve the following deficits and impairments:  Abnormal gait, Decreased activity tolerance, Decreased endurance, Decreased range of motion, Decreased skin integrity, Decreased strength, Hypomobility, Increased fascial restricitons, Impaired sensation, Pain, Decreased mobility, Decreased scar mobility, Difficulty walking, Increased edema, Increased muscle  spasms, Impaired flexibility  Visit Diagnosis: Acute pain of right knee  Stiffness of right knee, not elsewhere classified  Chronic pain of left knee  Other abnormalities of gait and mobility  Muscle weakness (generalized)  Localized edema     Problem List Patient Active Problem List   Diagnosis Date Noted   Arthrofibrosis of knee joint, right    Arthritis of right knee    Diabetic gastroparesis ([place]) 07/20/2021   Constipation 07/20/2021   S/P total knee arthroplasty, right 07/05/2021   Chronic diarrhea 04/29/2021   Belching 04/29/2021   Primary osteoarthritis of both knees 10/28/2020   Irritable bowel syndrome with diarrhea 10/16/2019   Tendinitis of right rotator cuff 09/17/2019   Hand pain, right 05/21/2019   Labral tear of hip joint 05/21/2019   Type 2 diabetes mellitus with diabetic neuropathy, unspecified ([place]) 10/01/2018   Right hip pain 05/22/2018   Tennis Must Quervain's disease (radial styloid tenosynovitis) 04/02/2018   Carpal tunnel syndrome, right upper limb 12/31/2017   Elbow injury, right, initial encounter 12/16/2017   Chronic pain of both shoulders 11/30/2017   PTSD (post-traumatic stress disorder) 10/25/2016   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) 03/29/2016   Plantar fasciitis 08/08/2013   Obesity, Class III, BMI 40-49.9 (morbid obesity) ([place]) 04/09/2013   Unspecified sleep apnea 03/31/2013   PCO (polycystic ovaries) 04/11/2012   Neck pain 11/21/2010   FATTY LIVER DISEASE 01/26/2010   NAUSEA WITH VOMITING 01/26/2010   PORTAL HYPERTENSION 01/26/2010   Migraine headache 06/28/2009   BREAST MASS 08/21/2008   KNEE PAIN 04/14/2008    Hypothyroidism 03/17/2008   Hyperlipidemia 03/17/2008   DEPRESSION 03/17/2008   NEUROPATHY 03/17/2008   Essential hypertension 03/17/2008   ASTHMA 03/17/2008   GERD 03/17/2008   Headache 03/17/2008    Madelyn Flavors, PT 11/17/2021, 11:10 AM  [facility] [address]  [place], [place], [address] Phone: [phone]   Fax:  [phone]  Name: Miranda [name] MRN: [id number] Date of Birth: Oct 05, 1976

## 2021-11-23 ENCOUNTER — Ambulatory Visit: Payer: BC Managed Care – PPO | Admitting: Physical Therapy

## 2021-11-25 ENCOUNTER — Other Ambulatory Visit: Payer: Self-pay

## 2021-11-25 ENCOUNTER — Ambulatory Visit: Payer: BC Managed Care – PPO

## 2021-11-25 ENCOUNTER — Other Ambulatory Visit: Payer: Self-pay | Admitting: Nurse Practitioner

## 2021-11-25 DIAGNOSIS — M25561 Pain in right knee: Secondary | ICD-10-CM

## 2021-11-25 DIAGNOSIS — M25661 Stiffness of right knee, not elsewhere classified: Secondary | ICD-10-CM

## 2021-11-25 DIAGNOSIS — R2689 Other abnormalities of gait and mobility: Secondary | ICD-10-CM

## 2021-11-25 DIAGNOSIS — G8929 Other chronic pain: Secondary | ICD-10-CM

## 2021-11-25 NOTE — Therapy (Signed)
[facility] [address]  [place], [place], [address] Phone: [phone]   Fax:  [phone]  Physical Therapy Treatment  Patient Details  Name: Miranda [name] MRN: [id number] Date of Birth: 1975-11-28 Referring Provider (PT): Dene Gentry   Encounter Date: 11/25/2021   PT End of Session - 11/25/21 1019     Visit Number 4    Number of Visits 12    Date for PT Re-Evaluation 12/21/21    Authorization Type BCBS    Authorization - Number of Visits --    Progress Note Due on Visit 10    PT Start Time 0935    PT Stop Time 1015    PT Time Calculation (min) 40 min    Activity Tolerance Patient tolerated treatment well    Behavior During Therapy [facility] for tasks assessed/performed             Past Medical History:  Diagnosis Date   Anginal pain ([place])    Anxiety    Arthritis    Asthma    Bowel obstruction ([place])    Cardiac arrhythmia    Depression    sees Dr. Matilde Haymaker in [place]    Diabetes mellitus    sees Dr. Madelin Rear    Dyspnea    Dysrhythmia    GERD (gastroesophageal reflux disease)    Hyperlipidemia    Hypertension    Hypothyroidism    sees Dr. Elyse Hsu   Insomnia    Migraine syndrome    sees Dr. 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 08/2017   Stroke ([place]) 03/25/2016   left MCA    Stroke [facility]) 04/2016   sees Dr. Teodora Medici at [facility]     Past Surgical History:  Procedure Laterality Date   blocked itestinal repair  1975   age 22 months   BREAST BIOPSY Left 2017   CHOLECYSTECTOMY  2011   INDUCED ABORTION  1996   forced abortion   INGUINAL HERNIA REPAIR Left 1980   age 26   KNEE CLOSED REDUCTION Right 08/22/2021   Procedure: RIGHT  KNEE  MANIPULATION UNDER ANESTHESIA;  Surgeon: Meredith Pel, MD;  Location: [place];  Service: Orthopedics;  Laterality: Right;   LAPAROSCOPIC ENDOMETRIOSIS FULGURATION  1998   TOTAL KNEE ARTHROPLASTY Right 07/05/2021   Procedure: TOTAL KNEE ARTHROPLASTY;  Surgeon: Meredith Pel, MD;  Location: [place];  Service: Orthopedics;  Laterality: Right;   WISDOM TOOTH EXTRACTION  2007    There were no vitals filed for this visit.   Subjective Assessment - 11/25/21 0940     Subjective My knees just hurt today.    Pertinent History R TKR- 07/05/2021, depression, PTSD,migraine/ TIA (loss of sensation on L side), L ischemic stroke, T2DM, irregular heartbeat, gastroparesis, IBS, asthma, GERD    Diagnostic tests X-ray- 11/08/21- Right knee replacement with intact hardware and normal alignment. No  fracture is seen. Probable suprapatellar effusion.    Patient Stated Goals want to strengthen L knee before has knee replacement, decrease pain both knees    Currently in Pain? Yes    Pain Score 4     Pain Location Knee  Pain Orientation Right    Pain Descriptors / Indicators Aching    Pain Type Chronic pain    Pain Score 2    Pain Location Knee    Pain Orientation Left    Pain Descriptors / Indicators Aching    Pain Type Chronic pain                               [facility] Adult PT Treatment/Exercise - 11/25/21 0001       Knee/Hip Exercises: Aerobic   Recumbent Bike L2x63min      Knee/Hip Exercises: Standing   Heel Raises Both;20 reps    Functional Squat 10 reps    Functional Squat Limitations single leg mini squats    SLS R/L 3x8" with UE assist      Knee/Hip Exercises: Supine   Bridges with Clamshell Strengthening;Both;2 sets;10 reps   red TB   Single Leg Bridge Strengthening;Left;10 reps      Knee/Hip Exercises: Sidelying   Hip ABduction Strengthening;Both;10 reps    Hip ABduction Limitations in slight extension    Clams R/L 2x10 with RTB                        PT Short Term Goals - 11/25/21 1022       PT SHORT TERM GOAL #1   Title Ind with initial HEP    Time 2    Period Weeks    Status Achieved   11/25/21   Target Date 11/23/21      PT SHORT TERM GOAL #2   Title Patient will complete balance screen to assess for any balance deficits    Time 2    Period Weeks    Status Achieved    Target Date 11/23/21               PT Long Term Goals - 11/15/21 1520       PT LONG TERM GOAL #1   Title Ind with progressed HEP for LE strengthening/gait    Time 6    Period Weeks    Status On-going    Target Date 12/21/21      PT LONG TERM GOAL #2   Title Pt. will demonstrate no more than 3/10 bil knee pain with transitions and stairs.    Baseline sharp shooting pains L knee, 6/10 R knee    Time 6    Period Weeks    Status On-going    Target Date 12/21/21      PT LONG TERM GOAL #3   Title Pt. will demonstrate improved functional strength by completeing 10x STS in < 20 seconds    Baseline 5x STS in 13 seconds, increased bil knee pain    Time 6    Period Weeks    Status On-going    Target Date 12/21/21      PT LONG TERM GOAL #4   Title Pt. will be able to ambulate 1000' without increased bil knee pain to access community.    Baseline 825' in 6 min with increased bil knee pain    Time 6    Period Weeks    Status On-going    Target Date 12/21/21                   Plan - 11/25/21 1021     Clinical Impression Statement Focused session on glute strengthening today for proximal stability.  Pt required cues to remain on her side with the clams and hip ABD. Cues needed to prevent knees going over toes with the squats. Pt responded well to the treatment today. Able to progress just fine w/o increased pain but instructions needed for optimal arthrokinematic movements.    Personal Factors and Comorbidities Comorbidity 3+    Comorbidities R TKR, depression, PTSD, obesity, TIA/migraine, T2DM, hypertension,  HT, gastroparesis, IBS, asthma, GERD, L knee OA, history ischemic stroke, heart irregularities    PT Frequency 2x / week    PT Duration 6 weeks    PT Treatment/Interventions ADLs/Self Care Home Management;Cryotherapy;Electrical Stimulation;Iontophoresis 4mg /ml Dexamethasone;Moist Heat;Ultrasound;Gait training;Stair training;Functional mobility training;Therapeutic activities;Therapeutic exercise;Balance training;Neuromuscular re-education;Patient/family education;Manual techniques;Scar mobilization;Passive range of motion;Dry needling;Taping;Vasopneumatic Device;Joint Manipulations    PT Next Visit Plan check balance (mCTSIB), bil hip/knee strengthening, higher level balance activities, low squats for help with toilet transfers,  modalities and manual to bil knees as indicated    PT Home Exercise Plan Access Code: [email or web address]    Consulted and Agree with Plan of Care Patient             Patient will benefit from skilled therapeutic intervention in order to improve the following deficits and impairments:  Abnormal gait, Decreased activity tolerance, Decreased endurance, Decreased range of motion, Decreased skin integrity, Decreased strength, Hypomobility, Increased fascial restricitons, Impaired sensation, Pain, Decreased mobility, Decreased scar mobility, Difficulty walking, Increased edema, Increased muscle spasms, Impaired flexibility  Visit Diagnosis: Acute pain of right knee  Stiffness of right knee, not elsewhere classified  Chronic pain of left knee  Other abnormalities of gait and mobility     Problem List Patient Active Problem List   Diagnosis Date Noted   Arthrofibrosis of knee joint, right    Arthritis of right knee    Diabetic gastroparesis ([place]) 07/20/2021   Constipation 07/20/2021   S/P total knee arthroplasty, right 07/05/2021   Chronic diarrhea 04/29/2021   Belching 04/29/2021   Primary osteoarthritis of both knees 10/28/2020   Irritable bowel syndrome with diarrhea  10/16/2019   Tendinitis of right rotator cuff 09/17/2019   Hand pain, right 05/21/2019   Labral tear of hip joint 05/21/2019   Type 2 diabetes mellitus with diabetic neuropathy, unspecified (HCC) 10/01/2018   Right hip pain 05/22/2018   Tennis Must Quervain's disease (radial styloid tenosynovitis) 04/02/2018   Carpal tunnel syndrome, right upper limb 12/31/2017   Elbow injury, right, initial encounter 12/16/2017   Chronic pain of both shoulders 11/30/2017   PTSD (post-traumatic stress disorder) 10/25/2016   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[place]) 10/25/2016   Diabetes mellitus ([place]) 10/25/2016   Major depressive disorder, recurrent episode, severe, with psychosis ([place]) 10/20/2016   Major depressive disorder, recurrent episode, severe, 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[place]) 10/19/2016   TIA (transient ischemic attack) 05/16/2016   Arterial ischemic stroke, MCA, left, acute ([place]) 03/29/2016   Plantar fasciitis 08/08/2013   Obesity, Class III, BMI 40-49.9 (morbid obesity) ([place]) 04/09/2013   Unspecified sleep apnea 03/31/2013   PCO (polycystic ovaries) 04/11/2012   Neck pain 11/21/2010   FATTY LIVER DISEASE 01/26/2010   NAUSEA WITH VOMITING 01/26/2010   PORTAL HYPERTENSION 01/26/2010   Migraine headache 06/28/2009   BREAST MASS 08/21/2008   KNEE PAIN 04/14/2008   Hypothyroidism 03/17/2008   Hyperlipidemia 03/17/2008   DEPRESSION 03/17/2008   NEUROPATHY 03/17/2008   Essential hypertension 03/17/2008   ASTHMA 03/17/2008   GERD 03/17/2008   Headache 03/17/2008    Artist Pais, PTA 11/25/2021, 10:43 AM  [facility] [address]  [place], [place], [address] Phone: [phone]   Fax:  [phone]  Name: Miranda [name] MRN: [id number] Date of Birth: December 17, 1975

## 2021-11-28 ENCOUNTER — Encounter: Payer: BC Managed Care – PPO | Admitting: Physical Therapy

## 2021-11-28 ENCOUNTER — Other Ambulatory Visit: Payer: Self-pay | Admitting: Nurse Practitioner

## 2021-11-28 DIAGNOSIS — N6320 Unspecified lump in the left breast, unspecified quadrant: Secondary | ICD-10-CM

## 2021-11-29 ENCOUNTER — Ambulatory Visit: Payer: BC Managed Care – PPO | Admitting: Physical Therapy

## 2021-11-29 ENCOUNTER — Other Ambulatory Visit: Payer: Self-pay

## 2021-11-29 ENCOUNTER — Encounter: Payer: Self-pay | Admitting: Physical Therapy

## 2021-11-29 DIAGNOSIS — M25561 Pain in right knee: Secondary | ICD-10-CM | POA: Diagnosis not present

## 2021-11-29 DIAGNOSIS — G8929 Other chronic pain: Secondary | ICD-10-CM

## 2021-11-29 DIAGNOSIS — M6281 Muscle weakness (generalized): Secondary | ICD-10-CM

## 2021-11-29 DIAGNOSIS — M25562 Pain in left knee: Secondary | ICD-10-CM

## 2021-11-29 DIAGNOSIS — M25661 Stiffness of right knee, not elsewhere classified: Secondary | ICD-10-CM

## 2021-11-29 DIAGNOSIS — R2689 Other abnormalities of gait and mobility: Secondary | ICD-10-CM

## 2021-11-29 NOTE — Therapy (Signed)
[facility] [address]  [place], [place], [address] Phone: [phone]   Fax:  [phone]  Physical Therapy Treatment  Patient Details  Name: Miranda [name] MRN: [id number] Date of Birth: 23-Mar-1976 Referring Provider (PT): Dene Gentry   Encounter Date: 11/29/2021   PT End of Session - 11/29/21 0940     Visit Number 5    Number of Visits 12    Date for PT Re-Evaluation 12/21/21    Authorization Type BCBS    Progress Note Due on Visit 10    PT Start Time [phone]    PT Stop Time 1015    PT Time Calculation (min) 39 min    Activity Tolerance Patient tolerated treatment well    Behavior During Therapy [facility] for tasks assessed/performed             Past Medical History:  Diagnosis Date   Anginal pain ([place])    Anxiety    Arthritis    Asthma    Bowel obstruction ([place])    Cardiac arrhythmia    Depression    sees Dr. Matilde Haymaker in [place]    Diabetes mellitus    sees Dr. Madelin Rear    Dyspnea    Dysrhythmia    GERD (gastroesophageal reflux disease)    Hyperlipidemia    Hypertension    Hypothyroidism    sees Dr. Elyse Hsu   Insomnia    Migraine syndrome    sees [facility]. 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 08/2017   Stroke ([place]) 03/25/2016   left MCA    Stroke [facility]) 04/2016   sees Dr. Teodora Medici at [facility]     Past Surgical History:  Procedure Laterality Date   blocked itestinal repair  1975   age 48 months   BREAST BIOPSY Left 2017   CHOLECYSTECTOMY  2011   INDUCED ABORTION  1996   forced abortion   INGUINAL HERNIA REPAIR Left 1980   age 46   KNEE CLOSED REDUCTION Right 08/22/2021   Procedure: RIGHT  KNEE MANIPULATION UNDER ANESTHESIA;  Surgeon:  Meredith Pel, MD;  Location: [place];  Service: Orthopedics;  Laterality: Right;   LAPAROSCOPIC ENDOMETRIOSIS FULGURATION  1998   TOTAL KNEE ARTHROPLASTY Right 07/05/2021   Procedure: TOTAL KNEE ARTHROPLASTY;  Surgeon: Meredith Pel, MD;  Location: [place];  Service: Orthopedics;  Laterality: Right;   WISDOM TOOTH EXTRACTION  2007    There were no vitals filed for this visit.   Subjective Assessment - 11/29/21 0939     Subjective L arthritic knee is doing well, R surgical knee giving me fits. Stiff today.    Pertinent History R TKR- 07/05/2021, depression, PTSD,migraine/ TIA (loss of sensation on L side), L ischemic stroke, T2DM, irregular heartbeat, gastroparesis, IBS, asthma, GERD    Diagnostic tests X-ray- 11/08/21- Right knee replacement with intact hardware and normal alignment. No  fracture is seen. Probable suprapatellar effusion.    Patient Stated Goals want to strengthen L knee before has knee replacement, decrease pain both knees    Currently in Pain? Yes    Pain Score 5     Pain Location Knee  Pain Orientation Right                               [facility] Adult PT Treatment/Exercise - 11/29/21 0001       Knee/Hip Exercises: Stretches   Other Knee/Hip Stretches LTR x 10 - tightness noted R ITB      Knee/Hip Exercises: Aerobic   Recumbent Bike L2x79min      Knee/Hip Exercises: Supine   Bridges Strengthening;2 sets;10 reps    Bridges Limitations RTB around thighs to activate glut med    Straight Leg Raises Strengthening;Both;2 sets;10 reps      Knee/Hip Exercises: Sidelying   Hip ABduction Strengthening;Both;10 reps    Hip ABduction Limitations in slight extension    Clams R/L 2x10 with RTB      Knee/Hip Exercises: Prone   Straight Leg Raises Strengthening;Both;2 sets;10 reps      Manual Therapy   Manual Therapy Soft tissue mobilization    Manual therapy comments to decrease tightness R knee    Soft tissue mobilization  IASTM and STM to R vastus lateralis                       PT Short Term Goals - 11/25/21 1022       PT SHORT TERM GOAL #1   Title Ind with initial HEP    Time 2    Period Weeks    Status Achieved   11/25/21   Target Date 11/23/21      PT SHORT TERM GOAL #2   Title Patient will complete balance screen to assess for any balance deficits    Time 2    Period Weeks    Status Achieved    Target Date 11/23/21               PT Long Term Goals - 11/15/21 1520       PT LONG TERM GOAL #1   Title Ind with progressed HEP for LE strengthening/gait    Time 6    Period Weeks    Status On-going    Target Date 12/21/21      PT LONG TERM GOAL #2   Title Pt. will demonstrate no more than 3/10 bil knee pain with transitions and stairs.    Baseline sharp shooting pains L knee, 6/10 R knee    Time 6    Period Weeks    Status On-going    Target Date 12/21/21      PT LONG TERM GOAL #3   Title Pt. will demonstrate improved functional strength by completeing 10x STS in < 20 seconds    Baseline 5x STS in 13 seconds, increased bil knee pain    Time 6    Period Weeks    Status On-going    Target Date 12/21/21      PT LONG TERM GOAL #4   Title Pt. will be able to ambulate 1000' without increased bil knee pain to access community.    Baseline 825' in 6 min with increased bil knee pain    Time 6    Period Weeks    Status On-going    Target Date 12/21/21                   Plan - 11/29/21 1207     Clinical Impression Statement Pt. reported increased R knee pain today, so focused on supine exercises.  She  also demonstrated tightness R ITB and difficulty with LTR due to tightness, manual therapy to R vastus lateralis and ITB decreased tightness.  Added supine leg extensions, very challenging on L side.    Personal Factors and Comorbidities Comorbidity 3+    Comorbidities R TKR, depression, PTSD, obesity, TIA/migraine, T2DM, hypertension, HT, gastroparesis, IBS,  asthma, GERD, L knee OA, history ischemic stroke, heart irregularities    PT Frequency 2x / week    PT Duration 6 weeks    PT Treatment/Interventions ADLs/Self Care Home Management;Cryotherapy;Electrical Stimulation;Iontophoresis 4mg /ml Dexamethasone;Moist Heat;Ultrasound;Gait training;Stair training;Functional mobility training;Therapeutic activities;Therapeutic exercise;Balance training;Neuromuscular re-education;Patient/family education;Manual techniques;Scar mobilization;Passive range of motion;Dry needling;Taping;Vasopneumatic Device;Joint Manipulations    PT Next Visit Plan check balance (mCTSIB), bil hip/knee strengthening, higher level balance activities, low squats for help with toilet transfers,  modalities and manual to bil knees as indicated    PT Home Exercise Plan Access Code: [email or web address]    Consulted and Agree with Plan of Care Patient             Patient will benefit from skilled therapeutic intervention in order to improve the following deficits and impairments:  Abnormal gait, Decreased activity tolerance, Decreased endurance, Decreased range of motion, Decreased skin integrity, Decreased strength, Hypomobility, Increased fascial restricitons, Impaired sensation, Pain, Decreased mobility, Decreased scar mobility, Difficulty walking, Increased edema, Increased muscle spasms, Impaired flexibility  Visit Diagnosis: Chronic pain of left knee  Acute pain of right knee  Stiffness of right knee, not elsewhere classified  Other abnormalities of gait and mobility  Muscle weakness (generalized)     Problem List Patient Active Problem List   Diagnosis Date Noted   Arthrofibrosis of knee joint, right    Arthritis of right knee    Diabetic gastroparesis ([place]) 07/20/2021   Constipation 07/20/2021   S/P total knee arthroplasty, right 07/05/2021   Chronic diarrhea 04/29/2021   Belching 04/29/2021   Primary osteoarthritis of both knees 10/28/2020   Irritable bowel syndrome with  diarrhea 10/16/2019   Tendinitis of right rotator cuff 09/17/2019   Hand pain, right 05/21/2019   Labral tear of hip joint 05/21/2019   Type 2 diabetes mellitus with diabetic neuropathy, unspecified (HCC) 10/01/2018   Right hip pain 05/22/2018   Tennis Must Quervain's disease (radial styloid tenosynovitis) 04/02/2018   Carpal tunnel syndrome, right upper limb 12/31/2017   Elbow injury, right, initial encounter 12/16/2017   Chronic pain of both shoulders 11/30/2017   PTSD (post-traumatic stress disorder) 10/25/2016   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[place]) 10/25/2016   Diabetes mellitus ([place]) 10/25/2016   Major depressive disorder, recurrent episode, severe, with psychosis ([place]) 10/20/2016   Major depressive disorder, recurrent episode, severe, 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[place]) 10/19/2016   TIA (transient ischemic attack) 05/16/2016   Arterial ischemic stroke, MCA, left, acute ([place]) 03/29/2016   Plantar fasciitis 08/08/2013   Obesity, Class III, BMI 40-49.9 (morbid obesity) ([place]) 04/09/2013   Unspecified sleep apnea 03/31/2013   PCO (polycystic ovaries) 04/11/2012   Neck pain 11/21/2010   FATTY LIVER DISEASE 01/26/2010   NAUSEA WITH VOMITING 01/26/2010   PORTAL HYPERTENSION 01/26/2010   Migraine headache 06/28/2009   BREAST MASS 08/21/2008   KNEE PAIN 04/14/2008   Hypothyroidism 03/17/2008   Hyperlipidemia 03/17/2008   DEPRESSION 03/17/2008   NEUROPATHY 03/17/2008   Essential hypertension 03/17/2008   ASTHMA 03/17/2008   GERD 03/17/2008   Headache 03/17/2008    Rennie Natter, PT, DPT  11/29/2021, 12:11 PM  [facility] [place]  [place], [place], [address] Phone: [phone]   Fax:  [phone]  Name: CHASITEE ZENKER MRN: [id number] Date of Birth: [birth year]

## 2021-12-01 ENCOUNTER — Ambulatory Visit: Payer: BC Managed Care – PPO | Admitting: Physical Therapy

## 2021-12-02 ENCOUNTER — Other Ambulatory Visit: Payer: Self-pay

## 2021-12-02 ENCOUNTER — Ambulatory Visit
Admission: RE | Admit: 2021-12-02 | Discharge: 2021-12-02 | Disposition: A | Discharge status: Home / Self Care | Payer: BC Managed Care – PPO | Source: Ambulatory Visit | Attending: Nurse Practitioner | Admitting: Nurse Practitioner

## 2021-12-02 DIAGNOSIS — N6320 Unspecified lump in the left breast, unspecified quadrant: Secondary | ICD-10-CM

## 2021-12-02 MED ORDER — GADOBUTROL 1 MMOL/ML IV SOLN
9.0000 mL | Freq: Once | INTRAVENOUS | Status: AC | PRN
  Administered 2021-12-02: 9 mL via INTRAVENOUS

## 2021-12-06 ENCOUNTER — Other Ambulatory Visit: Payer: Self-pay

## 2021-12-06 ENCOUNTER — Encounter: Payer: Self-pay | Admitting: Physical Therapy

## 2021-12-06 ENCOUNTER — Ambulatory Visit: Payer: BC Managed Care – PPO | Attending: Family Medicine | Admitting: Physical Therapy

## 2021-12-06 DIAGNOSIS — G8929 Other chronic pain: Secondary | ICD-10-CM | POA: Insufficient documentation

## 2021-12-06 DIAGNOSIS — R2689 Other abnormalities of gait and mobility: Secondary | ICD-10-CM | POA: Insufficient documentation

## 2021-12-06 DIAGNOSIS — M6281 Muscle weakness (generalized): Secondary | ICD-10-CM | POA: Insufficient documentation

## 2021-12-06 DIAGNOSIS — M25561 Pain in right knee: Secondary | ICD-10-CM | POA: Insufficient documentation

## 2021-12-06 DIAGNOSIS — M25661 Stiffness of right knee, not elsewhere classified: Secondary | ICD-10-CM | POA: Diagnosis present

## 2021-12-06 DIAGNOSIS — M25562 Pain in left knee: Secondary | ICD-10-CM | POA: Diagnosis not present

## 2021-12-06 NOTE — Therapy (Signed)
[facility] [place] [address]  [place], [place], [address] Phone: [phone]   Fax:  [phone]  Physical Therapy Treatment  Patient Details  Name: Miranda [name] MRN: [id number] Date of Birth: Mar 13, 1976 Referring Provider (PT): Dene Gentry   Encounter Date: 12/06/2021   PT End of Session - 12/06/21 0935     Visit Number 6    Number of Visits 12    Date for PT Re-Evaluation 12/21/21    Authorization Type BCBS    Progress Note Due on Visit 10    PT Start Time 0930    PT Stop Time 1013    PT Time Calculation (min) 43 min    Activity Tolerance Patient tolerated treatment well    Behavior During Therapy [facility] for tasks assessed/performed             Past Medical History:  Diagnosis Date   Anginal pain ([place])    Anxiety    Arthritis    Asthma    Bowel obstruction ([place])    Cardiac arrhythmia    Depression    sees Dr. Matilde Haymaker in [place]    Diabetes mellitus    sees Dr. Madelin Rear    Dyspnea    Dysrhythmia    GERD (gastroesophageal reflux disease)    Hyperlipidemia    Hypertension    Hypothyroidism    sees Dr. Elyse Hsu   Insomnia    Migraine syndrome    sees Dr. 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 08/2017   Stroke ([place]) 03/25/2016   left MCA    Stroke [facility]) 04/2016   sees Dr. Teodora Medici at [facility]     Past Surgical History:  Procedure Laterality Date   blocked itestinal repair  1975   age 53 months   BREAST BIOPSY Left 2017   CHOLECYSTECTOMY  2011   INDUCED ABORTION  1996   forced abortion   INGUINAL HERNIA REPAIR Left 1980   age 55   KNEE CLOSED REDUCTION Right 08/22/2021   Procedure: RIGHT  KNEE MANIPULATION UNDER ANESTHESIA;  Surgeon:  Meredith Pel, MD;  Location: [facility];  Service: Orthopedics;  Laterality: Right;   LAPAROSCOPIC ENDOMETRIOSIS FULGURATION  1998   TOTAL KNEE ARTHROPLASTY Right 07/05/2021   Procedure: TOTAL KNEE ARTHROPLASTY;  Surgeon: Meredith Pel, MD;  Location: [place];  Service: Orthopedics;  Laterality: Right;   WISDOM TOOTH EXTRACTION  2007    There were no vitals filed for this visit.   Subjective Assessment - 12/06/21 0933     Subjective Pt. reports she is feeling better.  Not as painful on the L as the R.  R knee (TKR) still clicks and hurts, even though she has full ROM.  L is doing better, not achey all the time.    Pertinent History R TKR- 07/05/2021, depression, PTSD,migraine/ TIA (loss of sensation on L side), L ischemic stroke, T2DM, irregular heartbeat, gastroparesis, IBS, asthma, GERD    Diagnostic tests X-ray- 11/08/21- Right knee replacement with intact hardware and normal alignment. No  fracture is seen. Probable suprapatellar effusion.    Patient Stated Goals want to strengthen L knee before has knee  replacement, decrease pain both knees    Currently in Pain? Yes    Pain Score 4     Pain Location Knee    Pain Orientation Right    Pain Descriptors / Indicators Aching                               [facility] Adult PT Treatment/Exercise - 12/06/21 0001       Exercises   Exercises Knee/Hip      Knee/Hip Exercises: Aerobic   Nustep L5 x 6 min      Knee/Hip Exercises: Machines for Strengthening   Cybex Knee Extension bil 10# 2 x 10    Cybex Knee Flexion bil 20# 2 x 10;      Knee/Hip Exercises: Standing   SLS with Vectors star excursion pattern with mirror for posture, starting 0-130 deg x3 bil, then back 0-90 deg x 3 bil, then combined about 220 deg arc.  SBA for safety.  1 LOB due to shoewear.      Knee/Hip Exercises: Sidelying   Hip ABduction Strengthening;Both;2 sets;10 reps    Hip ABduction Limitations in slight extension    Other  Sidelying Knee/Hip Exercises arcs x 5 bil      Manual Therapy   Manual Therapy Soft tissue mobilization    Manual therapy comments to decrease tightness R knee    Soft tissue mobilization brief IASTM to vastus lateralis and quad femoris                       PT Short Term Goals - 11/25/21 1022       PT SHORT TERM GOAL #1   Title Ind with initial HEP    Time 2    Period Weeks    Status Achieved   11/25/21   Target Date 11/23/21      PT SHORT TERM GOAL #2   Title Patient will complete balance screen to assess for any balance deficits    Time 2    Period Weeks    Status Achieved    Target Date 11/23/21               PT Long Term Goals - 11/15/21 1520       PT LONG TERM GOAL #1   Title Ind with progressed HEP for LE strengthening/gait    Time 6    Period Weeks    Status On-going    Target Date 12/21/21      PT LONG TERM GOAL #2   Title Pt. will demonstrate no more than 3/10 bil knee pain with transitions and stairs.    Baseline sharp shooting pains L knee, 6/10 R knee    Time 6    Period Weeks    Status On-going    Target Date 12/21/21      PT LONG TERM GOAL #3   Title Pt. will demonstrate improved functional strength by completeing 10x STS in < 20 seconds    Baseline 5x STS in 13 seconds, increased bil knee pain    Time 6    Period Weeks    Status On-going    Target Date 12/21/21      PT LONG TERM GOAL #4   Title Pt. will be able to ambulate 1000' without increased bil knee pain to access community.    Baseline 825' in 6 min with increased bil knee pain    Time  6    Period Weeks    Status On-going    Target Date 12/21/21                   Plan - 12/06/21 1014     Clinical Impression Statement Pt. continues to report improving L knee but R knee pain and clicking.  Today focused on knee/hip strengthening as well as balance exercises, no LOB with start excursion pattern other than when shoe slipped.  Very challenged with sidelying  hip arcs both sides.   She still has some tightness along R ITB so brief IASTM to area to decrease tightness.    Personal Factors and Comorbidities Comorbidity 3+    Comorbidities R TKR, depression, PTSD, obesity, TIA/migraine, T2DM, hypertension, HT, gastroparesis, IBS, asthma, GERD, L knee OA, history ischemic stroke, heart irregularities    PT Frequency 2x / week    PT Duration 6 weeks    PT Treatment/Interventions ADLs/Self Care Home Management;Cryotherapy;Electrical Stimulation;Iontophoresis 4mg /ml Dexamethasone;Moist Heat;Ultrasound;Gait training;Stair training;Functional mobility training;Therapeutic activities;Therapeutic exercise;Balance training;Neuromuscular re-education;Patient/family education;Manual techniques;Scar mobilization;Passive range of motion;Dry needling;Taping;Vasopneumatic Device;Joint Manipulations    PT Next Visit Plan check balance (mCTSIB), bil hip/knee strengthening, higher level balance activities, low squats for help with toilet transfers,  modalities and manual to bil knees as indicated    PT Home Exercise Plan Access Code: [email or web address]    Consulted and Agree with Plan of Care Patient             Patient will benefit from skilled therapeutic intervention in order to improve the following deficits and impairments:  Abnormal gait, Decreased activity tolerance, Decreased endurance, Decreased range of motion, Decreased skin integrity, Decreased strength, Hypomobility, Increased fascial restricitons, Impaired sensation, Pain, Decreased mobility, Decreased scar mobility, Difficulty walking, Increased edema, Increased muscle spasms, Impaired flexibility  Visit Diagnosis: Chronic pain of left knee  Acute pain of right knee  Stiffness of right knee, not elsewhere classified  Other abnormalities of gait and mobility     Problem List Patient Active Problem List   Diagnosis Date Noted   Arthrofibrosis of knee joint, right    Arthritis of right knee    Diabetic  gastroparesis ([place]) 07/20/2021   Constipation 07/20/2021   S/P total knee arthroplasty, right 07/05/2021   Chronic diarrhea 04/29/2021   Belching 04/29/2021   Primary osteoarthritis of both knees 10/28/2020   Irritable bowel syndrome with diarrhea 10/16/2019   Tendinitis of right rotator cuff 09/17/2019   Hand pain, right 05/21/2019   Labral tear of hip joint 05/21/2019   Type 2 diabetes mellitus with diabetic neuropathy, unspecified ([place]) 10/01/2018   Right hip pain 05/22/2018   Tennis Must Quervain's disease (radial styloid tenosynovitis) 04/02/2018   Carpal tunnel syndrome, right upper limb 12/31/2017   Elbow injury, right, initial encounter 12/16/2017   Chronic pain of both shoulders 11/30/2017   PTSD (post-traumatic stress disorder) 10/25/2016   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[place]) 10/25/2016   Diabetes mellitus ([place]) 10/25/2016   Major depressive disorder, recurrent episode, severe, with psychosis ([place]) 10/20/2016   Major depressive disorder, recurrent episode, severe, 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[place]) 10/19/2016   TIA (transient ischemic attack) 05/16/2016   Arterial ischemic stroke, MCA, left, acute ([place]) 03/29/2016   Plantar fasciitis 08/08/2013   Obesity, Class III, BMI 40-49.9 (morbid obesity) ([place]) 04/09/2013   Unspecified sleep apnea 03/31/2013   PCO (polycystic ovaries) 04/11/2012   Neck pain 11/21/2010   FATTY LIVER DISEASE 01/26/2010   NAUSEA WITH VOMITING 01/26/2010   PORTAL HYPERTENSION 01/26/2010   Migraine  headache 06/28/2009   BREAST MASS 08/21/2008   KNEE PAIN 04/14/2008   Hypothyroidism 03/17/2008   Hyperlipidemia 03/17/2008   DEPRESSION 03/17/2008   NEUROPATHY 03/17/2008   Essential hypertension 03/17/2008   ASTHMA 03/17/2008   GERD 03/17/2008   Headache 03/17/2008    Rennie Natter, PT, DPT  12/06/2021, 10:17 AM  [facility] [address]  [place], [place], [address] Phone: [phone]    Fax:  [phone]  Name: SHALITA NOTTE MRN: [id number] Date of Birth: 1976-03-28

## 2021-12-08 ENCOUNTER — Other Ambulatory Visit: Payer: Self-pay

## 2021-12-08 ENCOUNTER — Encounter: Payer: Self-pay | Admitting: Physical Therapy

## 2021-12-08 ENCOUNTER — Ambulatory Visit: Payer: BC Managed Care – PPO | Admitting: Physical Therapy

## 2021-12-08 DIAGNOSIS — M6281 Muscle weakness (generalized): Secondary | ICD-10-CM

## 2021-12-08 DIAGNOSIS — R2689 Other abnormalities of gait and mobility: Secondary | ICD-10-CM

## 2021-12-08 DIAGNOSIS — M25561 Pain in right knee: Secondary | ICD-10-CM

## 2021-12-08 DIAGNOSIS — M25562 Pain in left knee: Secondary | ICD-10-CM | POA: Diagnosis not present

## 2021-12-08 DIAGNOSIS — G8929 Other chronic pain: Secondary | ICD-10-CM

## 2021-12-08 NOTE — Patient Instructions (Signed)
° ° °  IONTOPHORESIS PATIENT PRECAUTIONS & CONTRAINDICATIONS:  Redness under one or both electrodes can occur.  This characterized by a uniform redness that usually disappears within 12 hours of treatment. Small pinhead size blisters may result in response to the drug.  Contact your physician if the problem persists more than 24 hours. On rare occasions, iontophoresis therapy can result in temporary skin reactions such as rash, inflammation, irritation or burns.  The skin reactions may be the result of individual sensitivity to the ionic solution used, the condition of the skin at the start of treatment, reaction to the materials in the electrodes, allergies or sensitivity to dexamethasone, or a poor connection between the patch and your skin.  Discontinue using iontophoresis if you have any of these reactions and report to your therapist. Remove the Patch or electrodes if you have any undue sensation of pain or burning during the treatment and report discomfort to your therapist. Tell your Therapist if you have had known adverse reactions to the application of electrical current. Approximate treatment time is 4-6 hours.  Remove the patch after 6 hours. The Patch can be worn during normal activity, however excessive motion where the electrodes have been placed can cause poor contact between the skin and the electrode or uneven electrical current resulting in greater risk of skin irritation. Keep out of the reach of children.   DO NOT use if you have a cardiac pacemaker or any other electrically sensitive implanted device. DO NOT use if you have a known sensitivity to dexamethasone. DO NOT use during Magnetic Resonance Imaging (MRI). DO NOT use over broken or compromised skin (e.g. sunburn, cuts, or acne) due to the increased risk of skin reaction. DO NOT SHAVE over the area to be treated:  To establish good contact between the Patch and the skin, excessive hair may be clipped. DO NOT place the  Patch or electrodes on or over your eyes, directly over your heart, or brain. DO NOT reuse the Patch or electrodes as this may cause burns to occur.   For questions, please contact your therapist at:  [facility] [place] [address]  [address] [place], [place], [address] Phone: [phone]   Fax:  [phone]  

## 2021-12-08 NOTE — Therapy (Signed)
[facility] [place] [address]  [place], [place], [address] Phone: [phone]   Fax:  [phone]  Physical Therapy Treatment  Patient Details  Name: Miranda [name] MRN: [id number] Date of Birth: 1976-06-10 Referring Provider (PT): Dene Gentry   Encounter Date: 12/08/2021   PT End of Session - 12/08/21 0857     Visit Number 7    Number of Visits 12    Date for PT Re-Evaluation 12/21/21    Authorization Type BCBS    Progress Note Due on Visit 10    PT Start Time 0853    PT Stop Time 0927    PT Time Calculation (min) 34 min    Activity Tolerance Patient tolerated treatment well;Patient limited by pain    Behavior During Therapy [facility] for tasks assessed/performed             Past Medical History:  Diagnosis Date   Anginal pain ([place])    Anxiety    Arthritis    Asthma    Bowel obstruction ([place])    Cardiac arrhythmia    Depression    sees Dr. Matilde Haymaker in [place]    Diabetes mellitus    sees Dr. Madelin Rear    Dyspnea    Dysrhythmia    GERD (gastroesophageal reflux disease)    Hyperlipidemia    Hypertension    Hypothyroidism    sees Dr. Elyse Hsu   Insomnia    Migraine syndrome    sees Dr. 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 08/2017   Stroke ([place]) 03/25/2016   left MCA    Stroke [facility]) 04/2016   sees Dr. Teodora Medici at [facility]     Past Surgical History:  Procedure Laterality Date   blocked itestinal repair  1975   age 56 months   BREAST BIOPSY Left 2017   CHOLECYSTECTOMY  2011   INDUCED ABORTION  1996   forced abortion   INGUINAL HERNIA REPAIR Left 1980   age 84   KNEE CLOSED REDUCTION Right 08/22/2021   Procedure: RIGHT  KNEE MANIPULATION UNDER  ANESTHESIA;  Surgeon: Meredith Pel, MD;  Location: [facility];  Service: Orthopedics;  Laterality: Right;   LAPAROSCOPIC ENDOMETRIOSIS FULGURATION  1998   TOTAL KNEE ARTHROPLASTY Right 07/05/2021   Procedure: TOTAL KNEE ARTHROPLASTY;  Surgeon: Meredith Pel, MD;  Location: [place];  Service: Orthopedics;  Laterality: Right;   WISDOM TOOTH EXTRACTION  2007    There were no vitals filed for this visit.   Subjective Assessment - 12/08/21 0857     Subjective "No pain in my L knee today, its all transferred to my R knee."   Also has a migraine for several days, which is preventing sleep.    Pertinent History R TKR- 07/05/2021, depression, PTSD,migraine/ TIA (loss of sensation on L side), L ischemic stroke, T2DM, irregular heartbeat, gastroparesis, IBS, asthma, GERD    Diagnostic tests X-ray- 11/08/21- Right knee replacement with intact hardware and normal alignment. No  fracture is seen. Probable suprapatellar effusion.    Patient Stated Goals want to strengthen L knee before has knee replacement, decrease pain both knees    Currently in  Pain? Yes    Pain Score 6     Pain Location Knee    Pain Orientation Right    Pain Descriptors / Indicators Aching                               [facility] Adult PT Treatment/Exercise - 12/08/21 0001       Exercises   Exercises Knee/Hip      Knee/Hip Exercises: Aerobic   Stationary Bike L1 x 6 min      Knee/Hip Exercises: Supine   Straight Leg Raises Strengthening;Both;2 sets;10 reps;Limitations    Straight Leg Raises Limitations 2# ankle weights      Knee/Hip Exercises: Sidelying   Hip ABduction Strengthening;Both;2 sets;10 reps;Limitations    Hip ABduction Limitations 2# ankle weights    Clams R 2 x 10 2# ankle weight on thigh      Modalities   Modalities Iontophoresis      Iontophoresis   Type of Iontophoresis Dexamethasone    Location R lateral knee    Dose 4mg /ml, 91mA    Time 4 hour wear time                      PT Education - 12/08/21 1035     Education Details education on purpose, contraindications, precautions, wear time and removal of iontophoresis patch.    Person(s) Educated Patient    Methods Explanation;Handout    Comprehension Verbalized understanding              PT Short Term Goals - 11/25/21 1022       PT SHORT TERM GOAL #1   Title Ind with initial HEP    Time 2    Period Weeks    Status Achieved   11/25/21   Target Date 11/23/21      PT SHORT TERM GOAL #2   Title Patient will complete balance screen to assess for any balance deficits    Time 2    Period Weeks    Status Achieved    Target Date 11/23/21               PT Long Term Goals - 11/15/21 1520       PT LONG TERM GOAL #1   Title Ind with progressed HEP for LE strengthening/gait    Time 6    Period Weeks    Status On-going    Target Date 12/21/21      PT LONG TERM GOAL #2   Title Pt. will demonstrate no more than 3/10 bil knee pain with transitions and stairs.    Baseline sharp shooting pains L knee, 6/10 R knee    Time 6    Period Weeks    Status On-going    Target Date 12/21/21      PT LONG TERM GOAL #3   Title Pt. will demonstrate improved functional strength by completeing 10x STS in < 20 seconds    Baseline 5x STS in 13 seconds, increased bil knee pain    Time 6    Period Weeks    Status On-going    Target Date 12/21/21      PT LONG TERM GOAL #4   Title Pt. will be able to ambulate 1000' without increased bil knee pain to access community.    Baseline 825' in 6 min with increased bil knee pain    Time 6    Period  Weeks    Status On-going    Target Date 12/21/21                   Plan - 12/08/21 1032     Clinical Impression Statement Pt. continues to report significant improvement in L knee pain but still has R knee pain.  Today focused on supine exercises but progressed to light weights, no pain increase but had to take short breaks to be  able to complete 10 reps with weights.  Noted pain localized over R lateral knee, after education consented to trial iontophoresis patch with dexamethasone, applied after skin inspection.    Personal Factors and Comorbidities Comorbidity 3+    Comorbidities R TKR, depression, PTSD, obesity, TIA/migraine, T2DM, hypertension, HT, gastroparesis, IBS, asthma, GERD, L knee OA, history ischemic stroke, heart irregularities    PT Frequency 2x / week    PT Duration 6 weeks    PT Treatment/Interventions ADLs/Self Care Home Management;Cryotherapy;Electrical Stimulation;Iontophoresis 4mg /ml Dexamethasone;Moist Heat;Ultrasound;Gait training;Stair training;Functional mobility training;Therapeutic activities;Therapeutic exercise;Balance training;Neuromuscular re-education;Patient/family education;Manual techniques;Scar mobilization;Passive range of motion;Dry needling;Taping;Vasopneumatic Device;Joint Manipulations    PT Next Visit Plan check balance (mCTSIB), bil hip/knee strengthening, higher level balance activities, low squats for help with toilet transfers,  modalities and manual to bil knees as indicated    PT Home Exercise Plan Access Code: [email or web address]    Consulted and Agree with Plan of Care Patient             Patient will benefit from skilled therapeutic intervention in order to improve the following deficits and impairments:  Abnormal gait, Decreased activity tolerance, Decreased endurance, Decreased range of motion, Decreased skin integrity, Decreased strength, Hypomobility, Increased fascial restricitons, Impaired sensation, Pain, Decreased mobility, Decreased scar mobility, Difficulty walking, Increased edema, Increased muscle spasms, Impaired flexibility  Visit Diagnosis: Acute pain of right knee  Chronic pain of left knee  Other abnormalities of gait and mobility  Muscle weakness (generalized)     Problem List Patient Active Problem List   Diagnosis Date Noted   Arthrofibrosis of knee  joint, right    Arthritis of right knee    Diabetic gastroparesis ([place]) 07/20/2021   Constipation 07/20/2021   S/P total knee arthroplasty, right 07/05/2021   Chronic diarrhea 04/29/2021   Belching 04/29/2021   Primary osteoarthritis of both knees 10/28/2020   Irritable bowel syndrome with diarrhea 10/16/2019   Tendinitis of right rotator cuff 09/17/2019   Hand pain, right 05/21/2019   Labral tear of hip joint 05/21/2019   Type 2 diabetes mellitus with diabetic neuropathy, unspecified ([place]) 10/01/2018   Right hip pain 05/22/2018   Tennis Must Quervain's disease (radial styloid tenosynovitis) 04/02/2018   Carpal tunnel syndrome, right upper limb 12/31/2017   Elbow injury, right, initial encounter 12/16/2017   Chronic pain of both shoulders 11/30/2017   PTSD (post-traumatic stress disorder) 10/25/2016   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[place]) 10/25/2016   Diabetes mellitus ([place]) 10/25/2016   Major depressive disorder, recurrent episode, severe, with psychosis ([place]) 10/20/2016   Major depressive disorder, recurrent episode, severe, 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[place]) 10/19/2016   TIA (transient ischemic attack) 05/16/2016   Arterial ischemic stroke, MCA, left, acute ([place]) 03/29/2016   Plantar fasciitis 08/08/2013   Obesity, Class III, BMI 40-49.9 (morbid obesity) ([place]) 04/09/2013   Unspecified sleep apnea 03/31/2013   PCO (polycystic ovaries) 04/11/2012   Neck pain 11/21/2010   FATTY LIVER DISEASE 01/26/2010   NAUSEA WITH VOMITING 01/26/2010   PORTAL HYPERTENSION 01/26/2010   Migraine headache 06/28/2009   BREAST  MASS 08/21/2008   KNEE PAIN 04/14/2008   Hypothyroidism 03/17/2008   Hyperlipidemia 03/17/2008   DEPRESSION 03/17/2008   NEUROPATHY 03/17/2008   Essential hypertension 03/17/2008   ASTHMA 03/17/2008   GERD 03/17/2008   Headache 03/17/2008    Rennie Natter, PT, DPT  12/08/2021, 10:36 AM  [facility] [address]   [place], [place], [address] Phone: [phone]   Fax:  [phone]  Name: Miranda [name] MRN: [id number] Date of Birth: 07-17-1976

## 2021-12-12 ENCOUNTER — Encounter: Payer: BC Managed Care – PPO | Admitting: Physical Therapy

## 2021-12-13 ENCOUNTER — Ambulatory Visit: Payer: BC Managed Care – PPO | Admitting: Physical Therapy

## 2021-12-13 ENCOUNTER — Other Ambulatory Visit: Payer: Self-pay

## 2021-12-13 ENCOUNTER — Encounter: Payer: Self-pay | Admitting: Physical Therapy

## 2021-12-13 DIAGNOSIS — M25562 Pain in left knee: Secondary | ICD-10-CM | POA: Diagnosis not present

## 2021-12-13 DIAGNOSIS — G8929 Other chronic pain: Secondary | ICD-10-CM

## 2021-12-13 DIAGNOSIS — M6281 Muscle weakness (generalized): Secondary | ICD-10-CM

## 2021-12-13 DIAGNOSIS — M25561 Pain in right knee: Secondary | ICD-10-CM

## 2021-12-13 DIAGNOSIS — R2689 Other abnormalities of gait and mobility: Secondary | ICD-10-CM

## 2021-12-13 NOTE — Therapy (Signed)
[facility] [address]  [place], [place], [address] Phone: [phone]   Fax:  [phone]  Physical Therapy Treatment  Patient Details  Name: Miranda [name] MRN: [id number] Date of Birth: 10-Jun-1976 Referring Provider (PT): Dene Gentry   Encounter Date: 12/13/2021   PT End of Session - 12/13/21 0956     Visit Number 8    Number of Visits 12    Date for PT Re-Evaluation 12/21/21    Authorization Type BCBS    Progress Note Due on Visit 10    PT Start Time 0933    PT Stop Time 1015    PT Time Calculation (min) 42 min    Activity Tolerance Patient tolerated treatment well;Patient limited by pain    Behavior During Therapy [facility] for tasks assessed/performed             Past Medical History:  Diagnosis Date   Anginal pain ([place])    Anxiety    Arthritis    Asthma    Bowel obstruction ([name])    Cardiac arrhythmia    Depression    sees Dr. Matilde Haymaker in [place]    Diabetes mellitus    sees Dr. Madelin Rear    Dyspnea    Dysrhythmia    GERD (gastroesophageal reflux disease)    Hyperlipidemia    Hypertension    Hypothyroidism    sees Dr. Elyse Hsu   Insomnia    Migraine syndrome    sees Dr. 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 08/2017   Stroke ([place]) 03/25/2016   left MCA    Stroke [facility]) 04/2016   se[facility] Teodora Medici at [facility]     Past Surgical History:  Procedure Laterality Date   blocked itestinal repair  1975   age 91 months   BREAST BIOPSY Left 2017   CHOLECYSTECTOMY  2011   INDUCED ABORTION  1996   forced abortion   INGUINAL HERNIA REPAIR Left 1980   age 24   KNEE CLOSED REDUCTION Right 08/22/2021   Procedure: RIGHT  KNEE MANIPULATION UNDER  ANESTHESIA;  Surgeon: Meredith Pel, MD;  Location: [place];  Service: Orthopedics;  Laterality: Right;   LAPAROSCOPIC ENDOMETRIOSIS FULGURATION  1998   TOTAL KNEE ARTHROPLASTY Right 07/05/2021   Procedure: TOTAL KNEE ARTHROPLASTY;  Surgeon: Meredith Pel, MD;  Location: [place];  Service: Orthopedics;  Laterality: Right;   WISDOM TOOTH EXTRACTION  2007    There were no vitals filed for this visit.   Subjective Assessment - 12/13/21 0950     Subjective L knee is still ok, R knee feels like something is "not right" "they took X-rays but then I fell that night."   Feels like R knee is getting worse, not better.   Ionto patch helped for about 24 hours.    Pertinent History R TKR- 07/05/2021, depression, PTSD,migraine/ TIA (loss of sensation on L side), L ischemic stroke, T2DM, irregular heartbeat, gastroparesis, IBS, asthma, GERD    Diagnostic tests X-ray- 11/08/21- Right knee replacement with intact hardware and normal alignment. No fracture is seen. Probable suprapatellar effusion.    Patient Stated Goals want to strengthen L  knee before has knee replacement, decrease pain both knees    Currently in Pain? Yes    Pain Score 6     Pain Location Knee    Pain Orientation Right                               [facility] Adult PT Treatment/Exercise - 12/13/21 0001       Exercises   Exercises Knee/Hip      Knee/Hip Exercises: Stretches   ITB Stretch Both;3 reps;20 seconds    ITB Stretch Limitations in supine, unable to do sitting without increased R knee pain    Other Knee/Hip Stretches Adductor stretch supine 3 x 20' bil      Knee/Hip Exercises: Aerobic   Stationary Bike L1 x 6 min      Knee/Hip Exercises: Supine   Straight Leg Raises Strengthening;Both;2 sets;10 reps;Limitations      Knee/Hip Exercises: Sidelying   Hip ABduction Strengthening;Right;5 reps    Hip ABduction Limitations increased pain      Modalities   Modalities Iontophoresis       Iontophoresis   Type of Iontophoresis Dexamethasone    Location R lateral knee    Dose 4mg /ml, 39mA    Time 4 hour wear time      Manual Therapy   Manual Therapy Soft tissue mobilization;Myofascial release    Manual therapy comments to decrease tightness/pain  R knee    Soft tissue mobilization IASTM with s/s tools to R quads and hamstrings and peroneals, STM to ITB insertion    Myofascial Release TPR R vastus lateralis                     PT Education - 12/13/21 1034     Education Details Skin inspected prior to patch application,  again educated on wear time, removal, and to remove patch early if have any irritation/burning.    Person(s) Educated Patient    Methods Explanation    Comprehension Verbalized understanding              PT Short Term Goals - 11/25/21 1022       PT SHORT TERM GOAL #1   Title Ind with initial HEP    Time 2    Period Weeks    Status Achieved   11/25/21   Target Date 11/23/21      PT SHORT TERM GOAL #2   Title Patient will complete balance screen to assess for any balance deficits    Time 2    Period Weeks    Status Achieved    Target Date 11/23/21               PT Long Term Goals - 11/15/21 1520       PT LONG TERM GOAL #1   Title Ind with progressed HEP for LE strengthening/gait    Time 6    Period Weeks    Status On-going    Target Date 12/21/21      PT LONG TERM GOAL #2   Title Pt. will demonstrate no more than 3/10 bil knee pain with transitions and stairs.    Baseline sharp shooting pains L knee, 6/10 R knee    Time 6    Period Weeks    Status On-going    Target Date 12/21/21      PT LONG TERM GOAL #3   Title Pt. will demonstrate improved functional  strength by completeing 10x STS in < 20 seconds    Baseline 5x STS in 13 seconds, increased bil knee pain    Time 6    Period Weeks    Status On-going    Target Date 12/21/21      PT LONG TERM GOAL #4   Title Pt. will be able to ambulate 1000'  without increased bil knee pain to access community.    Baseline 825' in 6 min with increased bil knee pain    Time 6    Period Weeks    Status On-going    Target Date 12/21/21                   Plan - 12/13/21 0958     Clinical Impression Statement Pt. continues to report R knee pain that is worsening.  She does have follow-up with orthopedist next Wednesday.  Today focused on gentle hip stretches and strengthening, but decreased tolerance noted with straight leg raises due to R knee pain.  Manual therapy to decrease tightness followed by ionto patch with dex applied to R knee to decrease pain.    Personal Factors and Comorbidities Comorbidity 3+    Comorbidities R TKR, depression, PTSD, obesity, TIA/migraine, T2DM, hypertension, HT, gastroparesis, IBS, asthma, GERD, L knee OA, history ischemic stroke, heart irregularities    PT Frequency 2x / week    PT Duration 6 weeks    PT Treatment/Interventions ADLs/Self Care Home Management;Cryotherapy;Electrical Stimulation;Iontophoresis 4mg /ml Dexamethasone;Moist Heat;Ultrasound;Gait training;Stair training;Functional mobility training;Therapeutic activities;Therapeutic exercise;Balance training;Neuromuscular re-education;Patient/family education;Manual techniques;Scar mobilization;Passive range of motion;Dry needling;Taping;Vasopneumatic Device;Joint Manipulations    PT Next Visit Plan check balance (mCTSIB), bil hip/knee strengthening, higher level balance activities, low squats for help with toilet transfers,  modalities and manual to bil knees as indicated    PT Home Exercise Plan Access Code: [email or web address]    Consulted and Agree with Plan of Care Patient             Patient will benefit from skilled therapeutic intervention in order to improve the following deficits and impairments:  Abnormal gait, Decreased activity tolerance, Decreased endurance, Decreased range of motion, Decreased skin integrity, Decreased strength, Hypomobility,  Increased fascial restricitons, Impaired sensation, Pain, Decreased mobility, Decreased scar mobility, Difficulty walking, Increased edema, Increased muscle spasms, Impaired flexibility  Visit Diagnosis: Chronic pain of left knee  Acute pain of right knee  Other abnormalities of gait and mobility  Muscle weakness (generalized)     Problem List Patient Active Problem List   Diagnosis Date Noted   Arthrofibrosis of knee joint, right    Arthritis of right knee    Diabetic gastroparesis ([place]) 07/20/2021   Constipation 07/20/2021   S/P total knee arthroplasty, right 07/05/2021   Chronic diarrhea 04/29/2021   Belching 04/29/2021   Primary osteoarthritis of both knees 10/28/2020   Irritable bowel syndrome with diarrhea 10/16/2019   Tendinitis of right rotator cuff 09/17/2019   Hand pain, right 05/21/2019   Labral tear of hip joint 05/21/2019   Type 2 diabetes mellitus with diabetic neuropathy, unspecified ([place]) 10/01/2018   Right hip pain 05/22/2018   Tennis Must Quervain's disease (radial styloid tenosynovitis) 04/02/2018   Carpal tunnel syndrome, right upper limb 12/31/2017   Elbow injury, right, initial encounter 12/16/2017   Chronic pain of both shoulders 11/30/2017   PTSD (post-traumatic stress disorder) 10/25/2016   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[place]) 10/25/2016   Diabetes mellitus ([place]) 10/25/2016   Major depressive disorder, recurrent episode, severe, with psychosis ([place]) 10/20/2016  Major depressive disorder, recurrent episode, severe, 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[place]) 10/19/2016   TIA (transient ischemic attack) 05/16/2016   Arterial ischemic stroke, MCA, left, acute ([place]) 03/29/2016   Plantar fasciitis 08/08/2013   Obesity, Class III, BMI 40-49.9 (morbid obesity) ([place]) 04/09/2013   Unspecified sleep apnea 03/31/2013   PCO (polycystic ovaries) 04/11/2012   Neck pain 11/21/2010   FATTY LIVER DISEASE 01/26/2010   NAUSEA WITH VOMITING 01/26/2010   PORTAL HYPERTENSION 01/26/2010    Migraine headache 06/28/2009   BREAST MASS 08/21/2008   KNEE PAIN 04/14/2008   Hypothyroidism 03/17/2008   Hyperlipidemia 03/17/2008   DEPRESSION 03/17/2008   NEUROPATHY 03/17/2008   Essential hypertension 03/17/2008   ASTHMA 03/17/2008   GERD 03/17/2008   Headache 03/17/2008    Rennie Natter, PT, DPT  12/13/2021, 10:37 AM  [facility] [address]  [place], [place], [address] Phone: [phone]   Fax:  [phone]  Name: Miranda [name] MRN: [id number] Date of Birth: 1976-04-22

## 2021-12-15 ENCOUNTER — Other Ambulatory Visit: Payer: Self-pay | Admitting: Family Medicine

## 2021-12-20 ENCOUNTER — Ambulatory Visit: Payer: BC Managed Care – PPO | Admitting: Physical Therapy

## 2021-12-20 ENCOUNTER — Encounter: Payer: Self-pay | Admitting: Orthopedic Surgery

## 2021-12-20 ENCOUNTER — Ambulatory Visit: Payer: Self-pay

## 2021-12-20 ENCOUNTER — Other Ambulatory Visit: Payer: Self-pay

## 2021-12-20 ENCOUNTER — Ambulatory Visit (INDEPENDENT_AMBULATORY_CARE_PROVIDER_SITE_OTHER): Payer: BC Managed Care – PPO | Admitting: Orthopedic Surgery

## 2021-12-20 ENCOUNTER — Encounter: Payer: Self-pay | Admitting: Physical Therapy

## 2021-12-20 ENCOUNTER — Other Ambulatory Visit: Payer: Self-pay | Admitting: Family Medicine

## 2021-12-20 DIAGNOSIS — M25561 Pain in right knee: Secondary | ICD-10-CM

## 2021-12-20 DIAGNOSIS — M6281 Muscle weakness (generalized): Secondary | ICD-10-CM

## 2021-12-20 DIAGNOSIS — R2689 Other abnormalities of gait and mobility: Secondary | ICD-10-CM

## 2021-12-20 DIAGNOSIS — M25562 Pain in left knee: Secondary | ICD-10-CM

## 2021-12-20 DIAGNOSIS — G8929 Other chronic pain: Secondary | ICD-10-CM

## 2021-12-20 NOTE — Progress Notes (Signed)
Office Visit Note   Patient: Miranda [name]           Date of Birth: 27-Oct-1976           MRN: [id number] Visit Date: 12/20/2021 Requested by: Vonita Moss, NP [place],  [place] [address] PCP: Vonita Moss, NP  Subjective: Chief Complaint  Patient presents with   Right Knee - Pain    HPI: Miranda [name] is a 46 y.o. female who presents to the office complaining of continued right knee pain.  Patient has history of right total knee arthroplasty on 07/05/2021 with subsequent right knee manipulation under anesthesia on 08/22/2021.  She is still in physical therapy and states that she has continued pain that she localizes mostly to the lateral aspect of the right knee.  She notes a snapping/clicking sensation primarily with flexion past 90 degrees and with extending to 0 degrees.  She notices this sensation with riding the stationary bike and ascending/descending stairs.  It does not really bother her much with walking around on flat ground though.  She also describes lateral pain that radiates from her lateral hip down to the proximal knee with some occasional pain that travels from the lateral knee down to the lateral distal calf.  She denies any low back pain or groin pain.  No buttock pain.  Denies any numbness or tingling that she notices on any consistent basis.  She does have some increased pain with laying on her right side.  She has previous radiographs of the right knee showing prosthesis in good position that were taken in January 2023 but she has had a new injury since then when she fell down her stairs.  She was experiencing her current symptoms prior to the fall the fall has not really made it worse.  She works as a Scientist, physiological..                ROS: All systems reviewed are negative as they relate to the chief complaint within the history of present illness.  Patient denies fevers or chills.  Assessment & Plan: Visit Diagnoses:  1. Right knee pain,  unspecified chronicity     Plan: Patient is a 46 year old female who presents s/p right total knee arthroplasty on 07/05/2021.  Her main complaint is lateral sided right knee pain with a palpable clicking sensation on exam today.  Ultrasound probe was applied and no significant abnormality was identified.  The clicking is fairly superficial.  No significant change in the appearance of radiographs compared with last set of x-rays.  Suspect that this is some superficial scar tissue that is going to work itself out in the coming months as patients with similar complaints have just required more time.  Recommend observation for now.  No indication for surgical intervention.  Does not feel like the popliteus tendon.  Localizes more to the joint line anterior laterally..  Patient understands and agrees with the plan.  Nothing really actionable based on ultrasound examination today.  Follow-up 6 months for clinical recheck which puts her about 1 year out from surgery  Follow-Up Instructions: No follow-ups on file.   Orders:  Orders Placed This Encounter  Procedures   XR KNEE 3 VIEW RIGHT   No orders of the defined types were placed in this encounter.     Procedures: No procedures performed   Clinical Data: No additional findings.  Objective: Vital Signs: There were no vitals taken for this visit.  Physical Exam:  Constitutional: Patient appears well-developed HEENT:  Head: Normocephalic Eyes:EOM are normal Neck: Normal range of motion Cardiovascular: Normal rate Pulmonary/chest: Effort normal Neurologic: Patient is alert Skin: Skin is warm Psychiatric: Patient has normal mood and affect  Ortho Exam: Ortho exam demonstrates right knee with 0 degrees extension and 110 degrees of knee flexion.  No effusion noted.  Incision is well-healed.  No calf tenderness.  Negative Homans' sign.  No pain with hip range of motion.  He does have moderate tenderness over the greater trochanter of the right  hip with no Trendelenburg gait.  Positive Ober test.  Able to perform straight leg raise without extensor lag.  Excellent quad strength.  5/5 motor strength of quad/hip flexion/hamstring/dorsiflexion/plantarflexion.  She does have palpable superficial clicking sensation when she flexes to about 100 degrees and with extending when she reaches about 10 degrees of flexion.  Specialty Comments:  No specialty comments available.  Imaging: No results found.   PMFS History: Patient Active Problem List   Diagnosis Date Noted   Arthrofibrosis of knee joint, right    Arthritis of right knee    Diabetic gastroparesis ([place]) 07/20/2021   Constipation 07/20/2021   S/P total knee arthroplasty, right 07/05/2021   Chronic diarrhea 04/29/2021   Belching 04/29/2021   Primary osteoarthritis of both knees 10/28/2020   Irritable bowel syndrome with diarrhea 10/16/2019   Tendinitis of right rotator cuff 09/17/2019   Hand pain, right 05/21/2019   Labral tear of hip joint 05/21/2019   Type 2 diabetes mellitus with diabetic neuropathy, unspecified ([place]) 10/01/2018   Right hip pain 05/22/2018   Tennis Must Quervain's disease (radial styloid tenosynovitis) 04/02/2018   Carpal tunnel syndrome, right upper limb 12/31/2017   Elbow injury, right, initial encounter 12/16/2017   Chronic pain of both shoulders 11/30/2017   PTSD (post-traumatic stress disorder) 10/25/2016   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   Diabetes mellitus ([place]) 10/25/2016   Major depressive disorder, recurrent episode, severe, with psychosis ([place]) 10/20/2016   Major depressive disorder, recurrent episode, severe, 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[place]) 10/19/2016   TIA (transient ischemic attack) 05/16/2016   Arterial ischemic stroke, MCA, left, acute ([place]) 03/29/2016   Plantar fasciitis 08/08/2013   Obesity, Class III, BMI 40-49.9 (morbid obesity) ([place]) 04/09/2013   Unspecified sleep apnea 03/31/2013   PCO (polycystic ovaries) 04/11/2012    Neck pain 11/21/2010   FATTY LIVER DISEASE 01/26/2010   NAUSEA WITH VOMITING 01/26/2010   PORTAL HYPERTENSION 01/26/2010   Migraine headache 06/28/2009   BREAST MASS 08/21/2008   KNEE PAIN 04/14/2008   Hypothyroidism 03/17/2008   Hyperlipidemia 03/17/2008   DEPRESSION 03/17/2008   NEUROPATHY 03/17/2008   Essential hypertension 03/17/2008   ASTHMA 03/17/2008   GERD 03/17/2008   Headache 03/17/2008   Past Medical History:  Diagnosis Date   Anginal pain ([place])    Anxiety    Arthritis    Asthma    Bowel obstruction ([place])    Cardiac arrhythmia    Depression    sees Dr. Matilde Haymaker in [place]    Diabetes mellitus    sees Dr. Madelin Rear    Dyspnea    Dysrhythmia    GERD (gastroesophageal reflux disease)    Hyperlipidemia    Hypertension    Hypothyroidism    sees Dr. Elyse Hsu   Insomnia    Migraine syndrome    sees Dr. Teodora Medici at [facility]    Morbid obesity [facility])    Multiple personality ([place])    Neck pain  Neuropathy associated with endocrine disorder ([place])    Sleep apnea with use of continuous positive airway pressure (CPAP)    Sleep apnea is resolved due to weight loss. 08/2017   Stroke ([place]) 03/25/2016   left MCA    Stroke ([place]) 04/2016   sees Dr. Teodora Medici at [place]  Adopted: Yes  Problem Relation Age of Onset   Heart disease Other        on both sides of family   Diabetes Other        on both sides of family    Past Surgical History:  Procedure Laterality Date   blocked itestinal repair  50   age 61 months   BREAST BIOPSY Left 2017   CHOLECYSTECTOMY  2011   INDUCED ABORTION  1996   forced abortion   INGUINAL HERNIA REPAIR Left 1980   age 29   KNEE CLOSED REDUCTION Right 08/22/2021   Procedure: RIGHT  KNEE MANIPULATION UNDER ANESTHESIA;  Surgeon: Meredith Pel, MD;  Location: [place];  Service: Orthopedics;  Laterality: Right;   LAPAROSCOPIC ENDOMETRIOSIS FULGURATION  1998   TOTAL KNEE  ARTHROPLASTY Right 07/05/2021   Procedure: TOTAL KNEE ARTHROPLASTY;  Surgeon: Meredith Pel, MD;  Location: [place];  Service: Orthopedics;  Laterality: Right;   WISDOM TOOTH EXTRACTION  2007   Social History   Occupational History   Occupation: Nurse, learning disability  Tobacco Use   Smoking status: Every Day    Packs/day: 0.25    Years: 0.50    Pack years: 0.13    Types: Cigarettes    Start date: 02/24/2018   Smokeless tobacco: Never   Tobacco comments:    has decreased how much - trying to quit  Vaping Use   Vaping Use: Former   Substances: CBD  Substance and Sexual Activity   Alcohol use: Yes    Comment: rarely   Drug use: Yes    Types: Marijuana    Comment: 2/10 last marijuana use   Sexual activity: Not on file

## 2021-12-20 NOTE — Therapy (Signed)
[name] [address]  [name], [place], [address] Phone: [phone]   Fax:  [phone]  Physical Therapy Treatment  Patient Details  Name: Miranda [name] MRN: [id number] Date of Birth: Mar 20, 1976 Referring Provider (PT): Dene Gentry   Encounter Date: 12/20/2021   PT End of Session - 12/20/21 0943     Visit Number 9    Number of Visits 12    Date for PT Re-Evaluation 12/21/21    Authorization Type BC[employer]    Progress Note Due on Visit 10    PT Start Time [phone]    PT Stop Time 1017    PT Time Calculation (min) 41 min    Activity Tolerance Patient tolerated treatment well;Patient limited by pain    Behavior During Therapy [facility] for tasks assessed/performed             Past Medical History:  Diagnosis Date   Anginal pain ([place])    Anxiety    Arthritis    Asthma    Bowel obstruction ([place])    Cardiac arrhythmia    Depression    sees Dr. Matilde Haymaker in [place]    Diabetes mellitus    sees Dr. Madelin Rear    Dyspnea    Dysrhythmia    GERD (gastroesophageal reflux disease)    Hyperlipidemia    Hypertension    Hypothyroidism    sees Dr. Elyse Hsu   Insomnia    Migraine syndrome    sees Dr. Teodora Medici at [facility]    Morbid obesity [facility])    Multiple personality ([place])    Neck pain    Neuropathy associated with endocrine disorder ([place])    Sleep apnea with use of continuous positive airway pressure (CPAP)    Sleep apnea is resolved due to weight loss. 08/2017   Stroke ([place]) 03/25/2016   left MCA    Stroke [facility]) 04/2016   sees Dr. Teodora Medici at [facility]     Past Surgical History:  Procedure Laterality Date   blocked itestinal repair  1975   age 48 months   BREAST BIOPSY Left 2017   CHOLECYSTECTOMY  2011   INDUCED ABORTION  1996   forced abortion   INGUINAL HERNIA REPAIR Left 1980   age 46   KNEE CLOSED REDUCTION Right 08/22/2021   Procedure: RIGHT  KNEE MANIPULATION UNDER  ANESTHESIA;  Surgeon: Meredith Pel, MD;  Location: [place];  Service: Orthopedics;  Laterality: Right;   LAPAROSCOPIC ENDOMETRIOSIS FULGURATION  1998   TOTAL KNEE ARTHROPLASTY Right 07/05/2021   Procedure: TOTAL KNEE ARTHROPLASTY;  Surgeon: Meredith Pel, MD;  Location: [place];  Service: Orthopedics;  Laterality: Right;   WISDOM TOOTH EXTRACTION  2007    There were no vitals filed for this visit.                      [place] Adult PT Treatment/Exercise - 12/20/21 0001       Knee/Hip Exercises: Aerobic   Nustep L5 x 6 min   bike too painful today     Knee/Hip Exercises: Machines for Strengthening   Cybex Knee Extension bil 20# 2 x 10    Cybex Knee Flexion LLE 20# 2 x 10      Manual Therapy   Manual Therapy Soft tissue mobilization;Myofascial release    Manual therapy comments to decrease tightness/pain  R knee    Soft tissue mobilization IASTM  with s/s tools to R quads and hamstrings and peroneals, STM to ITB insertion    Myofascial Release TPR R peroneals, HS attachment                       PT Short Term Goals - 11/25/21 1022       PT SHORT TERM GOAL #1   Title Ind with initial HEP    Time 2    Period Weeks    Status Achieved   11/25/21   Target Date 11/23/21      PT SHORT TERM GOAL #2   Title Patient will complete balance screen to assess for any balance deficits    Time 2    Period Weeks    Status Achieved    Target Date 11/23/21               PT Long Term Goals - 11/15/21 1520       PT LONG TERM GOAL #1   Title Ind with progressed HEP for LE strengthening/gait    Time 6    Period Weeks    Status On-going    Target Date 12/21/21      PT LONG TERM GOAL #2   Title Pt. will demonstrate no more than 3/10 bil knee pain with transitions and stairs.    Baseline sharp shooting pains L knee, 6/10 R knee    Time 6    Period Weeks    Status On-going    Target Date 12/21/21      PT LONG TERM GOAL #3    Title Pt. will demonstrate improved functional strength by completeing 10x STS in < 20 seconds    Baseline 5x STS in 13 seconds, increased bil knee pain    Time 6    Period Weeks    Status On-going    Target Date 12/21/21      PT LONG TERM GOAL #4   Title Pt. will be able to ambulate 1000' without increased bil knee pain to access community.    Baseline 825' in 6 min with increased bil knee pain    Time 6    Period Weeks    Status On-going    Target Date 12/21/21                   Plan - 12/20/21 0943     Clinical Impression Statement Pt. continues to report R knee pain as well as clicking and popping as walks, so has scheduled appt. with Dr. Marlou Sa about her R TKR.  Her L knee continues to do well and demonstrate signficantly more strength compared to R, today able to do HS curls with LLE only with 20#, but increased pain when trying to use RLE to assist with 20#.  Focused session on manual therapy due to pain, noted tightness and trigger points throughout R peroneals, gluts and HS.  Will see what her MD says about the R knee pain, but has met goals for L knee pain.    Personal Factors and Comorbidities Comorbidity 3+    Comorbidities R TKR, depression, PTSD, obesity, TIA/migraine, T2DM, hypertension, HT, gastroparesis, IBS, asthma, GERD, L knee OA, history ischemic stroke, heart irregularities    PT Frequency 2x / week    PT Duration 6 weeks    PT Treatment/Interventions ADLs/Self Care Home Management;Cryotherapy;Electrical Stimulation;Iontophoresis 59m/ml Dexamethasone;Moist Heat;Ultrasound;Gait training;Stair training;Functional mobility training;Therapeutic activities;Therapeutic exercise;Balance training;Neuromuscular re-education;Patient/family education;Manual techniques;Scar mobilization;Passive range of motion;Dry needling;Taping;Vasopneumatic Device;Joint Manipulations  PT Next Visit Plan possible 30 day hold.    PT Home Exercise Plan Access Code: [email or web address]    [email or web address]  and Agree with Plan of Care Patient             Patient will benefit from skilled therapeutic intervention in order to improve the following deficits and impairments:  Abnormal gait, Decreased activity tolerance, Decreased endurance, Decreased range of motion, Decreased skin integrity, Decreased strength, Hypomobility, Increased fascial restricitons, Impaired sensation, Pain, Decreased mobility, Decreased scar mobility, Difficulty walking, Increased edema, Increased muscle spasms, Impaired flexibility  Visit Diagnosis: Chronic pain of left knee  Acute pain of right knee  Other abnormalities of gait and mobility  Muscle weakness (generalized)     Problem List Patient Active Problem List   Diagnosis Date Noted   Arthrofibrosis of knee joint, right    Arthritis of right knee    Diabetic gastroparesis ([place]) 07/20/2021   Constipation 07/20/2021   S/P total knee arthroplasty, right 07/05/2021   Chronic diarrhea 04/29/2021   Belching 04/29/2021   Primary osteoarthritis of both knees 10/28/2020   Irritable bowel syndrome with diarrhea 10/16/2019   Tendinitis of right rotator cuff 09/17/2019   Hand pain, right 05/21/2019   Labral tear of hip joint 05/21/2019   Type 2 diabetes mellitus with diabetic neuropathy, unspecified ([place]) 10/01/2018   Right hip pain 05/22/2018   [place] Quervain's disease (radial styloid tenosynovitis) 04/02/2018   Carpal tunnel syndrome, right upper limb 12/31/2017   Elbow injury, right, initial encounter 12/16/2017   Chronic pain of both shoulders 11/30/2017   PTSD (post-traumatic stress disorder) 10/25/2016   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   Diabetes mellitus ([place]) 10/25/2016   Major depressive disorder, recurrent episode, severe, with psychosis ([place]) 10/20/2016   Major depressive disorder, recurrent episode, severe, 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[place]) 10/19/2016   TIA (transient ischemic attack) 05/16/2016   Arterial ischemic stroke, MCA, left,  acute ([place]) 03/29/2016   Plantar fasciitis 08/08/2013   Obesity, Class III, BMI 40-49.9 (morbid obesity) ([place]) 04/09/2013   Unspecified sleep apnea 03/31/2013   PCO (polycystic ovaries) 04/11/2012   Neck pain 11/21/2010   FATTY LIVER DISEASE 01/26/2010   NAUSEA WITH VOMITING 01/26/2010   PORTAL HYPERTENSION 01/26/2010   Migraine headache 06/28/2009   BREAST MASS 08/21/2008   KNEE PAIN 04/14/2008   Hypothyroidism 03/17/2008   Hyperlipidemia 03/17/2008   DEPRESSION 03/17/2008   NEUROPATHY 03/17/2008   Essential hypertension 03/17/2008   ASTHMA 03/17/2008   GERD 03/17/2008   Headache 03/17/2008    Rennie Natter, PT, DPT  12/20/2021, 10:53 AM  [facility] [address]  [place], [place], [address] Phone: [phone]   Fax:  [phone]  Name: MARALYN WITHERELL MRN: [id number] Date of Birth: [birth year]

## 2021-12-21 ENCOUNTER — Encounter: Payer: Self-pay | Admitting: Family Medicine

## 2021-12-21 ENCOUNTER — Ambulatory Visit (INDEPENDENT_AMBULATORY_CARE_PROVIDER_SITE_OTHER): Payer: BC Managed Care – PPO | Admitting: Family Medicine

## 2021-12-21 VITALS — BP 109/66 | Ht 61.75 in | Wt 208.0 lb

## 2021-12-21 DIAGNOSIS — M25561 Pain in right knee: Secondary | ICD-10-CM

## 2021-12-21 DIAGNOSIS — M25562 Pain in left knee: Secondary | ICD-10-CM

## 2021-12-21 DIAGNOSIS — G8929 Other chronic pain: Secondary | ICD-10-CM

## 2021-12-21 NOTE — Progress Notes (Signed)
PCP: Vonita Moss, NP  Subjective:   HPI: Patient is a 46 y.o. female here for bilateral knee pain.  1/10: Patient had right knee replacement 07/05/21 with manipulation on 08/22/21. She has continued to have popping and lateral, posterior pain in this right knee with some radiation down into right lower leg but no numbness. Done fairly extensive physical therapy and doing home exercises as well. Her motion is much better than it was though pain persists as above. She also has had left knee pain medially with known arthropathy here - not interested in injections again right now but would like to do physical therapy for this.  2/22: Overall [place] [name] continues to make slow progress with her bilateral knee pain. She continues with physical therapy and home exercises. Working to get strength back in right leg. Saw Dr. Marlou Sa yesterday for her right knee and was reassured. Radiographs last visit without loosening of prosthesis and clicking more consistent with soft tissue. Left knee improving with rehab.  Past Medical History:  Diagnosis Date   Anginal pain ([place])    Anxiety    Arthritis    Asthma    Bowel obstruction ([place])    Cardiac arrhythmia    Depression    sees Dr. Matilde Haymaker in [place]    Diabetes mellitus    sees Dr. Madelin Rear    Dyspnea    Dysrhythmia    GERD (gastroesophageal reflux disease)    Hyperlipidemia    Hypertension    Hypothyroidism    sees Dr. Elyse Hsu   Insomnia    Migraine syndrome    sees [facility] 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 08/2017   Stroke ([place]) 03/25/2016   left MCA    Stroke ([place]) 04/2016   sees Dr. Teodora Medici at [facility]     Current Outpatient Medications on File Prior to Visit  Medication Sig  Dispense Refill   acetaminophen (TYLENOL) 325 MG tablet Take 2 tablets (650 mg total) by mouth every 6 (six) hours as needed for mild pain (pain score 1-3 or temp > 100.5). 30 tablet 0   albuterol (PROAIR HFA) 108 (90 Base) MCG/ACT inhaler Inhale 2 puffs into the lungs every 4 (four) hours as needed for wheezing. 8.5 g 11   ARIPiprazole (ABILIFY) 30 MG tablet Take 30 mg by mouth every evening.     baclofen (LIORESAL) 10 MG tablet Take 10 mg by mouth every 4 (four) hours as needed (headaches). (Patient not taking: Reported on 11/09/2021)     budesonide-formoterol (SYMBICORT) 160-4.5 MCG/ACT inhaler Inhale 2 puffs into the lungs 2 (two) times daily. (Patient taking differently: Inhale 2 puffs into the lungs 2 (two) times daily as needed (asthma).) 1 Inhaler 11   buPROPion (WELLBUTRIN XL) 150 MG 24 hr tablet Take 150-300 mg by mouth See admin instructions. 300 mg in the morning, 150 mg in the evening     clopidogrel (PLAVIX) 75 MG tablet TAKE ONE TABLET BY MOUTH DAILY (Patient taking differently: Take 75 mg by mouth daily.) 90 tablet 0   diclofenac Sodium (VOLTAREN) 1 % GEL Apply 1 application topically 2 (two) times daily as needed (pain).     diphenoxylate-atropine (LOMOTIL) 2.5-0.025 MG tablet Take 1 tablet  by mouth every 6 (six) hours as needed for diarrhea or loose stools. (Patient taking differently: Take 2 tablets by mouth every 6 (six) hours as needed for diarrhea or loose stools.) 30 tablet 0   docusate sodium (COLACE) 100 MG capsule Take 1 capsule (100 mg total) by mouth 2 (two) times daily. 10 capsule 0   famotidine (PEPCID) 40 MG tablet Take 40 mg by mouth 2 (two) times daily.     fluticasone (FLONASE) 50 MCG/ACT nasal spray Place 2 sprays into both nostrils daily as needed for allergies or rhinitis.     gabapentin (NEURONTIN) 400 MG capsule Take 1 capsule (400 mg total) by mouth 3 (three) times daily. 90 capsule 1   Glucagon (BAQSIMI ONE PACK) 3 MG/DOSE POWD Place 3 mg into the nose daily as  needed (low blood sugar).     hydrOXYzine (VISTARIL) 50 MG capsule Take 50 mg by mouth 3 (three) times daily.     hyoscyamine (LEVSIN SL) 0.125 MG SL tablet Place 1 tablet (0.125 mg total) under the tongue every 6 (six) hours as needed. 30 tablet 1   ibuprofen (ADVIL) 200 MG tablet Take 800 mg by mouth in the morning and at bedtime.     insulin aspart (NOVOLOG) 100 UNIT/ML injection Use in the insulin pump as directed 10 mL 0   levothyroxine (SYNTHROID, LEVOTHROID) 125 MCG tablet Take 1 tablet (125 mcg total) by mouth daily before breakfast. 90 tablet 3   lisinopril (ZESTRIL) 10 MG tablet Take 10 mg by mouth daily.     loratadine (CLARITIN) 10 MG tablet Take 10 mg by mouth daily as needed for allergies.     Menthol, Topical Analgesic, (BIOFREEZE) 10 % LIQD Apply 1 application topically 2 (two) times daily as needed (pain).     metoCLOPramide (REGLAN) 5 MG tablet Take 0.5 tablets (2.5 mg total) by mouth 4 (four) times daily -  before meals and at bedtime. 60 tablet 1   metoprolol succinate (TOPROL-XL) 25 MG 24 hr tablet Take 0.5 tablets (12.5 mg total) by mouth daily. (Patient taking differently: Take 25 mg by mouth daily.) 30 tablet 0   montelukast (SINGULAIR) 10 MG tablet Take 10 mg by mouth daily.     Multiple Vitamin (MULTIVITAMIN) tablet Take 1 tablet by mouth daily.     nystatin [facility]) powder Apply topically QID. (Patient taking differently: Apply 1 application topically 2 (two) times daily as needed (yeast/ dry skin).) 60 g 0   ondansetron (ZOFRAN-ODT) 4 MG disintegrating tablet Take 4 mg by mouth every 8 (eight) hours as needed for nausea or vomiting.     oxyCODONE (OXY IR/ROXICODONE) 5 MG immediate release tablet Take 1 tablet (5 mg total) by mouth every 6 (six) hours as needed for moderate pain (pain score 4-6). 30 tablet 0   OZEMPIC, 0.25 OR 0.5 MG/DOSE, 2 MG/1.5ML SOPN Inject 0.5 mg into the skin every Sunday.     pantoprazole (PROTONIX) 40 MG tablet Take 1 tablet (40 mg total) by mouth  2 (two) times daily. 180 tablet 2   polyethylene glycol powder (GLYCOLAX/MIRALAX) 17 GM/SCOOP powder Use 1 capful twice daily in 8 ounces of fluid 510 g 3   prazosin (MINIPRESS) 5 MG capsule Take 10 mg by mouth at bedtime.     promethazine (PHENERGAN) 25 MG tablet Take 1 tablet (25 mg total) by mouth every 4 (four) hours as needed for nausea or vomiting. (Patient taking differently: Take 25 mg by mouth every 6 (six) hours as needed  for nausea or vomiting.) 60 tablet 1   Rimegepant Sulfate (NURTEC) 75 MG TBDP Take 75 mg by mouth daily as needed (migraines).     rosuvastatin (CRESTOR) 20 MG tablet Take 1 tablet (20 mg total) by mouth daily. 30 tablet 6   terconazole (TERAZOL 7) 0.4 % vaginal cream Place 1 applicator vaginally at bedtime as needed (yeast infections).     topiramate (TOPAMAX) 100 MG tablet Take 100 mg by mouth 2 (two) times daily.      traZODone (DESYREL) 150 MG tablet Take 150 mg by mouth at bedtime.     Ubrogepant (UBRELVY) 100 MG TABS Take 100 mg by mouth daily as needed (migraines). May repeat in 2 hours as needed. No more than 200 mg in 24 hours     valACYclovir (VALTREX) 1000 MG tablet Take 1 tablet (1,000 mg total) by mouth 3 (three) times daily. (Patient taking differently: Take 1,000 mg by mouth 2 (two) times daily as needed (breakouts).) 30 tablet 0   No current facility-administered medications on file prior to visit.    Past Surgical History:  Procedure Laterality Date   blocked itestinal repair  74   age 87 months   BREAST BIOPSY Left 2017   CHOLECYSTECTOMY  2011   INDUCED ABORTION  1996   forced abortion   INGUINAL HERNIA REPAIR Left 1980   age 27   KNEE CLOSED REDUCTION Right 08/22/2021   Procedure: RIGHT  KNEE MANIPULATION UNDER ANESTHESIA;  Surgeon: Meredith Pel, MD;  Location: [place];  Service: Orthopedics;  Laterality: Right;   LAPAROSCOPIC ENDOMETRIOSIS FULGURATION  1998   TOTAL KNEE ARTHROPLASTY Right 07/05/2021   Procedure: TOTAL  KNEE ARTHROPLASTY;  Surgeon: Meredith Pel, MD;  Location: [place];  Service: Orthopedics;  Laterality: Right;   WISDOM TOOTH EXTRACTION  2007    Allergies  Allergen Reactions   Macrobid [Nitrofurantoin] Hives   Metformin And Related Other (See Comments)    Abdominal cramping    [place] 10 Mcg Pen [Exenatide] Hives   Clindamycin/Lincomycin Nausea And Vomiting   Geodon [Ziprasidone Hcl]     Body starts shutting down   Lipitor [Atorvastatin]     Per patient this causes cramps   Nsaids     Heartburn   Penicillins Other (See Comments)    Childhood reaction Has patient had a PCN reaction causing immediate rash, facial/tongue/throat swelling, SOB or lightheadedness with hypotension: No Has patient had a PCN reaction causing severe rash involving mucus membranes or skin necrosis: No Has patient had a PCN reaction that required hospitalization No Has patient had a PCN reaction occurring within the last 10 years: No If all of the above answers are "NO", then may proceed with Cephalosporin use.    Trulicity [Dulaglutide]     Abdominal pain    Avocado Diarrhea and Other (See Comments)    Severe cramping, sweats, and diarrhea in upper GI/stomach   Tramadol Hcl Itching and Rash    BP 109/66    Ht 5' 1.75" (1.568 m)    Wt 208 lb (94.3 kg)    BMI 38.35 kg/m   No flowsheet data found.  No flowsheet data found.      Objective:  Physical Exam:  Gen: NAD, comfortable in exam room  Right knee: Well healed TKR scar.  Minimal effusion.  No other gross deformity, ecchymoses, swelling. Mild lateral TTP. FROM with normal gross strength flexion and extension.  Normal ankle dorsiflexion strength. Negative valgus/varus testing NV intact distally.  Left knee: No gross deformity, ecchymoses, swelling. TTP medial joint line. FROM with normal strength. Negative ant/post drawers. Negative valgus/varus testing. Negative lachman.  Negative mcmurrays, apleys.  NV intact distally.    Assessment & Plan:  1. Right knee pain - s/p TKR on 07/05/21.  Radiographs without loosening.  Reassured with Dr. Marlou Sa yesterday - some scar tissue and should improve with time.  Continue physical therapy, home exercises.  2. Left knee pain - known medial arthritis.  Improving with physical therapy.  Will need to build more strength right leg before pursuing replacement of left.

## 2021-12-22 ENCOUNTER — Encounter: Payer: Self-pay | Admitting: Physical Therapy

## 2021-12-22 ENCOUNTER — Other Ambulatory Visit: Payer: Self-pay

## 2021-12-22 ENCOUNTER — Ambulatory Visit: Payer: BC Managed Care – PPO | Admitting: Physical Therapy

## 2021-12-22 DIAGNOSIS — M6281 Muscle weakness (generalized): Secondary | ICD-10-CM

## 2021-12-22 DIAGNOSIS — M25562 Pain in left knee: Secondary | ICD-10-CM

## 2021-12-22 DIAGNOSIS — G8929 Other chronic pain: Secondary | ICD-10-CM

## 2021-12-22 DIAGNOSIS — R2689 Other abnormalities of gait and mobility: Secondary | ICD-10-CM

## 2021-12-22 DIAGNOSIS — M25561 Pain in right knee: Secondary | ICD-10-CM

## 2021-12-22 NOTE — Therapy (Signed)
[facility] [address]  [place], [place], [address] Phone: [phone]   Fax:  [phone]  Physical Therapy Treatment PHYSICAL THERAPY DISCHARGE SUMMARY  Visits from Start of Care: 10  Current functional level related to goals / functional outcomes: Decreased L knee pain, improved functional mobility and strength.  See clinical impression below.    Remaining deficits: R knee pain   Education / Equipment: HEP  Plan: Patient agrees to discharge.   Patient is being discharged due to meeting the stated rehab goals.       Patient Details  Name: Miranda [name] MRN: [id number] Date of Birth: [birth year] Referring Provider (PT): Dene Gentry   Encounter Date: 12/22/2021   PT End of Session - 12/22/21 0851     Visit Number 10    Number of Visits 12    Date for PT Re-Evaluation 12/21/21    Authorization Type BCBS    Progress Note Due on Visit 10    PT Start Time [id number]    PT Stop Time 0940    PT Time Calculation (min) 53 min    Activity Tolerance Patient tolerated treatment well;Patient limited by pain    Behavior During Therapy [facility] for tasks assessed/performed             Past Medical History:  Diagnosis Date   Anginal pain ([place])    Anxiety    Arthritis    Asthma    Bowel obstruction ([place])    Cardiac arrhythmia    Depression    sees Dr. Matilde Haymaker in [place]    Diabetes mellitus    sees Dr. Madelin Rear    Dyspnea    Dysrhythmia    GERD (gastroesophageal reflux disease)    Hyperlipidemia    Hypertension    Hypothyroidism    sees Dr. Elyse Hsu   Insomnia    Migraine syndrome    sees Dr. 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 08/2017   Stroke ([place])  03/25/2016   left MCA    Stroke [facility]) 04/2016   se[facility] Dr. Teodora Medici at [facility]     Past Surgical History:  Procedure Laterality Date   blocked itestinal repair  1975   age 9 months   BREAST BIOPSY Left 2017   CHOLECYSTECTOMY  2011   INDUCED ABORTION  1996   forced abortion   INGUINAL HERNIA REPAIR Left 1980   age 52   KNEE CLOSED REDUCTION Right 08/22/2021   Procedure: RIGHT  KNEE MANIPULATION UNDER ANESTHESIA;  Surgeon: Meredith Pel, MD;  Location: [place];  Service: Orthopedics;  Laterality: Right;   LAPAROSCOPIC ENDOMETRIOSIS FULGURATION  1998   TOTAL KNEE ARTHROPLASTY Right 07/05/2021   Procedure: TOTAL KNEE ARTHROPLASTY;  Surgeon: Meredith Pel, MD;  Location: [place];  Service: Orthopedics;  Laterality: Right;   WISDOM TOOTH EXTRACTION  2007    There were no vitals filed for this visit.   Subjective Assessment - 12/22/21 0853     Subjective Pt. saw orthopedist who thought she had superficial scarring in knee and said it would take time to resolve and to try voltaren gel.  Repeated X-rays and [place]  and found no concernsShe also saw Dr. Barbaraann Barthel who repeated Xrays and [place], thought    Pertinent History R TKR- 07/05/2021, depression, PTSD,migraine/ TIA (loss of sensation on L side), L ischemic stroke, T2DM, irregular heartbeat, gastroparesis, IBS, asthma, GERD    Diagnostic tests X-ray- 11/08/21- Right knee replacement with intact hardware and normal alignment. No fracture is seen. Probable suprapatellar effusion.    Patient Stated Goals want to strengthen L knee before has knee replacement, decrease pain both knees                [facility] PT Assessment - 12/22/21 0001       Assessment   Medical Diagnosis M25.561,M25.562,G89.29 (ICD-10-CM) - Bilateral chronic knee pain    Referring Provider (PT) Hudnall, Shane R.    Onset Date/Surgical Date 07/05/21    Hand Dominance Right    Next MD Visit 4-6 months      Precautions   Precautions None       Restrictions   Weight Bearing Restrictions No      Observation/Other Assessments   Focus on Therapeutic Outcomes (FOTO)  52% - no change      6 minute walk test results    Aerobic Endurance Distance Walked 1050    Endurance additional comments R knee felt looser after walking, no increase in L knee pain.  Mostly limited by L hip pain today                           [facility] Adult PT Treatment/Exercise - 12/22/21 0001       Knee/Hip Exercises: Aerobic   Stationary Bike L1 x 6 min      Knee/Hip Exercises: Seated   Sit to Sand 2 sets;10 reps;with UE support      Modalities   Modalities Moist Heat      Moist Heat Therapy   Number Minutes Moist Heat 10 Minutes    Moist Heat Location Lumbar Spine;Hip      Manual Therapy   Manual Therapy Soft tissue mobilization;Myofascial release    Manual therapy comments to decrease muscle spasm and pain    Soft tissue mobilization IASTM with foam roller to L gluts    Myofascial Release TPR to L piriformis/glut med                       PT Short Term Goals - 11/25/21 1022       PT SHORT TERM GOAL #1   Title Ind with initial HEP    Time 2    Period Weeks    Status Achieved   11/25/21   Target Date 11/23/21      PT SHORT TERM GOAL #2   Title Patient will complete balance screen to assess for any balance deficits    Time 2    Period Weeks    Status Achieved    Target Date 11/23/21               PT Long Term Goals - 12/22/21 0858       PT LONG TERM GOAL #1   Title Ind with progressed HEP for LE strengthening/gait    Time 6    Period Weeks    Status Achieved    Target Date 12/21/21      PT LONG TERM GOAL #2   Title Pt. will demonstrate no more than 3/10 bil knee pain with transitions and stairs.    Baseline  sharp shooting pains L knee, 6/10 R knee    Time 6    Period Weeks    Status Partially Met   12/22/20- improved L knee pain, usually only 1-2/10, R knee still 6/10   Target Date 12/21/21       PT LONG TERM GOAL #3   Title Pt. will demonstrate improved functional strength by completeing 10x STS in < 20 seconds    Baseline 5x STS in 13 seconds, increased bil knee pain    Time 6    Period Weeks    Status Achieved   12/22/21- 17.5 seconds   Target Date 12/21/21      PT LONG TERM GOAL #4   Title Pt. will be able to ambulate 1000' without increased bil knee pain to access community.    Baseline 825' in 6 min with increased bil knee pain    Time 6    Period Weeks    Status Achieved   12/22/21- 1050 ft in 6 minutes, no increase in knee pain   Target Date 12/21/21                   Plan - 12/22/21 1158     Clinical Impression Statement Pt. was reassured at recent visits that there were no signs of loosening of hardware or infection in her R knee, and that her knee just needs more time.  Her Left knee pain has improved significantly, and she reported that she is no able to delay original plan of L TKA until her R knee pain improves.  She reports independence with her HEP, is compliant and finds them helpful for decreasing pain.  She has met all of her goals with exception of R knee pain, demonstrates improved functional strength and mobility.  Due to R knee pain, FOTO has not changed (52%).  She would like to take a break from physical therapy and continue to perform HEP at home.    Personal Factors and Comorbidities Comorbidity 3+    Comorbidities R TKR, depression, PTSD, obesity, TIA/migraine, T2DM, hypertension, HT, gastroparesis, IBS, asthma, GERD, L knee OA, history ischemic stroke, heart irregularities    PT Frequency 2x / week    PT Duration 6 weeks    PT Treatment/Interventions ADLs/Self Care Home Management;Cryotherapy;Electrical Stimulation;Iontophoresis 81m/ml Dexamethasone;Moist Heat;Ultrasound;Gait training;Stair training;Functional mobility training;Therapeutic activities;Therapeutic exercise;Balance training;Neuromuscular re-education;Patient/family  education;Manual techniques;Scar mobilization;Passive range of motion;Dry needling;Taping;Vasopneumatic Device;Joint Manipulations    PT Next Visit Plan possible 30 day hold.    PT Home Exercise Plan Access Code: [email or web address]    [email or web address] Agree with Plan of Care Patient             Patient will benefit from skilled therapeutic intervention in order to improve the following deficits and impairments:  Abnormal gait, Decreased activity tolerance, Decreased endurance, Decreased range of motion, Decreased skin integrity, Decreased strength, Hypomobility, Increased fascial restricitons, Impaired sensation, Pain, Decreased mobility, Decreased scar mobility, Difficulty walking, Increased edema, Increased muscle spasms, Impaired flexibility  Visit Diagnosis: Chronic pain of left knee  Acute pain of right knee  Other abnormalities of gait and mobility  Muscle weakness (generalized)     Problem List Patient Active Problem List   Diagnosis Date Noted   Arthrofibrosis of knee joint, right    Arthritis of right knee    Diabetic gastroparesis ([place] 07/20/2021   Constipation 07/20/2021   S/P total knee arthroplasty, right 07/05/2021   Chronic diarrhea 04/29/2021   Belching 04/29/2021   Primary osteoarthritis of  both knees 10/28/2020   Irritable bowel syndrome with diarrhea 10/16/2019   Tendinitis of right rotator cuff 09/17/2019   Hand pain, right 05/21/2019   Labral tear of hip joint 05/21/2019   Type 2 diabetes mellitus with diabetic neuropathy, unspecified ([place]) 10/01/2018   Right hip pain 05/22/2018   [place] Quervain's disease (radial styloid tenosynovitis) 04/02/2018   Carpal tunnel syndrome, right upper limb 12/31/2017   Elbow injury, right, initial encounter 12/16/2017   Chronic pain of both shoulders 11/30/2017   PTSD (post-traumatic stress disorder) 10/25/2016   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   Diabetes mellitus ([place]) 10/25/2016   Major depressive disorder,  recurrent episode, severe, with psychosis ([place]) 10/20/2016   Major depressive disorder, recurrent episode, severe, 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[place]) 10/19/2016   TIA (transient ischemic attack) 05/16/2016   Arterial ischemic stroke, MCA, left, acute ([place]) 03/29/2016   Plantar fasciitis 08/08/2013   Obesity, Class III, BMI 40-49.9 (morbid obesity) ([place]) 04/09/2013   Unspecified sleep apnea 03/31/2013   PCO (polycystic ovaries) 04/11/2012   Neck pain 11/21/2010   FATTY LIVER DISEASE 01/26/2010   NAUSEA WITH VOMITING 01/26/2010   PORTAL HYPERTENSION 01/26/2010   Migraine headache 06/28/2009   BREAST MASS 08/21/2008   KNEE PAIN 04/14/2008   Hypothyroidism 03/17/2008   Hyperlipidemia 03/17/2008   DEPRESSION 03/17/2008   NEUROPATHY 03/17/2008   Essential hypertension 03/17/2008   ASTHMA 03/17/2008   GERD 03/17/2008   Headache 03/17/2008    Rennie Natter, PT, DPT  12/22/2021, 12:02 PM  [facility] [address]  [place], [place], [address] Phone: [phone]   Fax:  [phone]  Name: Miranda [name] MRN: [id number] Date of Birth: 1976-04-11

## 2021-12-26 ENCOUNTER — Encounter: Payer: BC Managed Care – PPO | Admitting: Physical Therapy

## 2022-02-23 ENCOUNTER — Ambulatory Visit: Payer: BC Managed Care – PPO | Attending: Family Medicine

## 2022-02-23 DIAGNOSIS — G8929 Other chronic pain: Secondary | ICD-10-CM | POA: Diagnosis present

## 2022-02-23 DIAGNOSIS — M25561 Pain in right knee: Secondary | ICD-10-CM | POA: Diagnosis present

## 2022-02-23 DIAGNOSIS — M6281 Muscle weakness (generalized): Secondary | ICD-10-CM | POA: Insufficient documentation

## 2022-02-23 DIAGNOSIS — R2689 Other abnormalities of gait and mobility: Secondary | ICD-10-CM | POA: Insufficient documentation

## 2022-02-23 NOTE — Therapy (Signed)
?OUTPATIENT PHYSICAL THERAPY LOWER EXTREMITY EVALUATION ? ? ?Patient Name: Miranda [name] ?MRN: [id number] ?DOB:05-20-1976, 46 y.o., female ?Today's Date: 02/24/2022 ? ? PT End of Session - 02/24/22 1248   ? ? Visit Number 1   ? Number of Visits 17   ? Date for PT Re-Evaluation 04/21/22   ? Authorization Type BCBS   ? PT Start Time 5188   ? PT Stop Time 4166   ? PT Time Calculation (min) 35 min   ? Activity Tolerance Patient tolerated treatment well;Patient limited by pain   ? Behavior During Therapy [facility] for tasks assessed/performed   ? ?  ?  ? ?  ? ? ?Past Medical History:  ?Diagnosis Date  ? Anginal pain ([place])   ? Anxiety   ? Arthritis   ? Asthma   ? Bowel obstruction ([place])   ? Cardiac arrhythmia   ? Depression   ? sees Dr. Matilde Haymaker in [place]   ? Diabetes mellitus   ? sees Dr. Larna Daughters [place]   ? Dyspnea   ? Dysrhythmia   ? GERD (gastroesophageal reflux disease)   ? Hyperlipidemia   ? Hypertension   ? Hypothyroidism   ? sees Dr. Elyse Hsu  ? Insomnia   ? Migraine syndrome   ? sees Dr. Teodora Medici at [facility]   ? Morbid obesity [facility])   ? 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 Neck pain   ? Neuropathy associated with endocrine disorder ([place])   ? Sleep apnea with use of continuous positive airway pressure (CPAP)   ? Sleep apnea is resolved due to weight loss. 08/2017  ? Stroke ([place]) 03/25/2016  ? left MCA   ? Stroke [facility]) 04/2016  ? sees Dr. Teodora Medici at [facility]   ? ?Past Surgical History:  ?Procedure Laterality Date  ? blocked itestinal repair  50  ? age 46 months  ? BREAST BIOPSY Left 2017  ? CHOLECYSTECTOMY  2011  ? INDUCED ABORTION  1996  ? forced abortion  ? INGUINAL HERNIA REPAIR Left 1980  ? age 55  ? KNEE CLOSED REDUCTION Right 08/22/2021  ? Procedure: RIGHT  KNEE MANIPULATION UNDER ANESTHESIA;  Surgeon: Meredith Pel, MD;  Location: [place];  Service: Orthopedics;  Laterality: Right;  ? [place]  ? TOTAL KNEE ARTHROPLASTY Right 07/05/2021  ?  Procedure: TOTAL KNEE ARTHROPLASTY;  Surgeon: Meredith Pel, MD;  Location: [place];  Service: Orthopedics;  Laterality: Right;  ? [place] EXTRACTION  2007  ? ?Patient Active Problem List  ? Diagnosis Date Noted  ? Arthrofibrosis of knee joint, right   ? Arthritis of right knee   ? Diabetic gastroparesis ([place]) 07/20/2021  ? Constipation 07/20/2021  ? S/P total knee arthroplasty, right 07/05/2021  ? Chronic diarrhea 04/29/2021  ? Belching 04/29/2021  ? Primary osteoarthritis of both knees 10/28/2020  ? Irritable bowel syndrome with diarrhea 10/16/2019  ? Tendinitis of right rotator cuff 09/17/2019  ? Hand pain, right 05/21/2019  ? Labral tear of hip joint 05/21/2019  ? Type 2 diabetes mellitus with diabetic neuropathy, unspecified ([place]) 10/01/2018  ? Right hip pain 05/22/2018  ? De Quervain's disease (radial styloid tenosynovitis) 04/02/2018  ? Carpal tunnel syndrome, right upper limb 12/31/2017  ? Elbow injury, right, initial encounter 12/16/2017  ? Chronic pain of both shoulders 11/30/2017  ? PTSD (post-traumatic stress disorder) 10/25/2016  ? 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 Diabetes mellitus ([place]) 10/25/2016  ? Major depressive  disorder, recurrent episode, severe, with psychosis ([place]) 10/20/2016  ? Major depressive disorder, recurrent episode, severe, 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[place]) 10/19/2016  ? TIA (transient ischemic attack) 05/16/2016  ? Arterial ischemic stroke, MCA, left, acute ([place]) 03/29/2016  ? Plantar fasciitis 08/08/2013  ? Obesity, Class III, BMI 40-49.9 (morbid obesity) ([place]) 04/09/2013  ? Unspecified sleep apnea 03/31/2013  ? PCO (polycystic ovaries) 04/11/2012  ? Neck pain 11/21/2010  ? FATTY LIVER DISEASE 01/26/2010  ? NAUSEA WITH VOMITING 01/26/2010  ? PORTAL HYPERTENSION 01/26/2010  ? Migraine headache 06/28/2009  ? BREAST MASS 08/21/2008  ? KNEE PAIN 04/14/2008  ? Hypothyroidism 03/17/2008  ? Hyperlipidemia 03/17/2008  ? DEPRESSION 03/17/2008  ? NEUROPATHY 03/17/2008  ?  Essential hypertension 03/17/2008  ? ASTHMA 03/17/2008  ? GERD 03/17/2008  ? Headache 03/17/2008  ? ? ?REFERRING PROVIDER:  ?Vonita Moss, NP ? ?REFERRING DIAG:  ?pain due to total knee replacement ? ?THERAPY DIAG:  ?Muscle weakness (generalized) ? ?Other abnormalities of gait and mobility ? ?Chronic pain of right knee ? ?ONSET DATE: Chronic ? ?SUBJECTIVE:  ? ?SUBJECTIVE STATEMENT: ?Pt presents to PT with chronic hx of R knee pain. Previous R TKA on 07/05/2021 with manipulation and physical therapy. She notes that she has good ROM now, but has continued pain and weakness. Continues to have popping and clicking in R knee with flex/ext. Pt notes she has difficulty with stairs, has not had any instances of buckling.  ? ?PERTINENT HISTORY: ?R TKR- 07/05/2021, depression, PTSD,migraine/ TIA (loss of sensation on L side), L ischemic stroke, T2DM, irregular heartbeat, gastroparesis ? ?PAIN:  ?Are you having pain?  ?Yes: NPRS scale: 1/10 (7/10) ?Pain location: R knee ?Pain description: sharp,  ?Aggravating factors: stairs, knee flexion ?Relieving factors: rest ? ?PRECAUTIONS: None ? ?WEIGHT BEARING RESTRICTIONS No ? ?FALLS:  ?Has patient fallen in last 6 months? Yes. Number of falls one - fall while going down stairs due to ankle twisting ? ?LIVING ENVIRONMENT: ?Lives with: lives with their family ?Lives in: House/apartment ?Stairs: Yes: External: 5 steps; bilateral but cannot reach both ?Has following equipment at home: Single point cane and Walker - 2 wheeled ? 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 ?PLOF: Independent and Independent with basic ADLs ? ?PATIENT GOALS: decrease pain in R knee in order to improve walking and general mobility  ? ? ?OBJECTIVE:  ? ?DIAGNOSTIC FINDINGS:  ? N/A ? ?PATIENT SURVEYS:  ?FOTO 47% function; 55% predicted ? ?COGNITION: ? Overall cognitive status: Within functional limits for tasks assessed   ?  ?SENSATION: ?Decreased light touch to R lateral LE ? ?POSTURE:  ?Medium body habitus, well  healed scar to R knee ? ?PALPATION: ?TTP to medial R knee joint line ? ?LE ROM: ? ?Active ROM Right ?02/24/2022 Left ?02/24/2022  ?Hip flexion    ?Hip extension    ?Hip abduction    ?Hip adduction    ?Hip internal rotation    ?Hip external rotation    ?Knee flexion 110   ?Knee extension 0   ?Ankle dorsiflexion    ?Ankle plantarflexion    ?Ankle inversion    ?Ankle eversion    ? (Blank rows = not tested) ? ?LE MMT: ? ?MMT Right ?02/24/2022 Left ?02/24/2022  ?Hip flexion 3+/5 4+/5  ?Hip extension    ?Hip abduction 3+/5 3+/5  ?Hip adduction    ?Hip internal rotation    ?Hip external rotation    ?Knee flexion 4/5 4+/5  ?Knee extension 4+/5 5/5  ?Ankle dorsiflexion    ?Ankle plantarflexion    ?  Ankle inversion    ?Ankle eversion    ? (Blank rows = not tested) ? ?LOWER EXTREMITY SPECIAL TESTS:  ?N/A ? ?FUNCTIONAL TESTS:  ?30 Second Sit to Stand: 13 reps ? ?GAIT: ?Distance walked: 36f ?Assistive device utilized: None ?Level of assistance: Complete Independence ?Comments: R antalgic gait ? ?[facility] TREAT[facility]                                                [facility] ?Therapeutic Exercise: ?SLR x 5 ?Supine clamshell x 15 GTB ?Standing TKE x 10 R - 5" ? ?PATIENT EDUCATION:  ?Education details: eval findings, FOTO, HEP, POC ?Person educated: Patient ?Education method: Explanation, Demonstration, and Handouts ?Education comprehension: verbalized understanding and returned demonstration ? ? ?HOME EXERCISE PROGRAM: ?Access Code: [email or web address]?URL: [email or web address] ?Date: 02/23/2022 ?Prepared by: DOctavio Manns? ?Exercises ?- Active Straight Leg Raise with Quad Set  - 1 x daily - 7 x weekly - 3 sets - 10 reps ?- Hooklying Clamshell with Resistance  - 1 x daily - 7 x weekly - 3 sets - 15 reps ?- Standing Terminal Knee Extension at Wall with Ball  - 1 x daily - 7 x weekly - 3 sets - 10 reps - 5 sec hold ? ?ASSESSMENT: ? ?CLINICAL IMPRESSION: ?Patient is a 46y.o. F who was seen today for  physical therapy evaluation and treatment for R knee pain. Physical findings are consistent with referring provider impression, as pt demonstrates decreased LE strength and pain during functional mobility. He

## 2022-03-01 ENCOUNTER — Ambulatory Visit (HOSPITAL_BASED_OUTPATIENT_CLINIC_OR_DEPARTMENT_OTHER): Payer: BC Managed Care – PPO | Attending: Nurse Practitioner | Admitting: Physical Therapy

## 2022-03-01 DIAGNOSIS — M6281 Muscle weakness (generalized): Secondary | ICD-10-CM

## 2022-03-01 DIAGNOSIS — Z96651 Presence of right artificial knee joint: Secondary | ICD-10-CM | POA: Diagnosis not present

## 2022-03-01 DIAGNOSIS — G8929 Other chronic pain: Secondary | ICD-10-CM | POA: Insufficient documentation

## 2022-03-01 DIAGNOSIS — M25561 Pain in right knee: Secondary | ICD-10-CM | POA: Diagnosis present

## 2022-03-01 DIAGNOSIS — R2689 Other abnormalities of gait and mobility: Secondary | ICD-10-CM

## 2022-03-01 NOTE — Therapy (Signed)
?OUTPATIENT PHYSICAL THERAPY LOWER EXTREMITY EVALUATION ? ? ?Patient Name: Miranda [name] ?MRN: [id number] ?DOB:1976-10-08, 46 y.o., female ?Today's Date: 03/01/2022 ? ? PT End of Session - 03/01/22 1815   ? ? Visit Number 2   ? Number of Visits 17   ? Date for PT Re-Evaluation 04/21/22   ? Authorization Type BCBS   ? PT Start Time 1415   ? PT Stop Time 1500   ? PT Time Calculation (min) 45 min   ? Activity Tolerance Patient tolerated treatment well;Patient limited by pain   ? Behavior During Therapy [facility] for tasks assessed/performed   ? ?  ?  ? ?  ? ? ? ?Past Medical History:  ?Diagnosis Date  ? Anginal pain ([place])   ? Anxiety   ? Arthritis   ? Asthma   ? Bowel obstruction ([place])   ? Cardiac arrhythmia   ? Depression   ? sees Dr. Matilde Haymaker in [place]   ? Diabetes mellitus   ? sees Dr. Larna Daughters Averneni   ? Dyspnea   ? Dysrhythmia   ? GERD (gastroesophageal reflux disease)   ? Hyperlipidemia   ? Hypertension   ? Hypothyroidism   ? sees Dr. Elyse Hsu  ? Insomnia   ? Migraine syndrome   ? [facility] at [facility]   ? Morbid obesity [facility])   ? 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 Neck pain   ? Neuropathy associated with endocrine disorder ([place])   ? Sleep apnea with use of continuous positive airway pressure (CPAP)   ? Sleep apnea is resolved due to weight loss. 08/2017  ? Stroke ([place]) 03/25/2016  ? left MCA   ? Stroke [facility]) 04/2016  ? sees [facility]. Teod[facility] Medici at [facility]   ? ?Past Surgical History:  ?Procedure Laterality Date  ? blocked itestinal repair  25  ? age 51 months  ? BREAST BIOPSY Left 2017  ? CHOLECYSTECTOMY  2011  ? INDUCED ABORTION  1996  ? forced abortion  ? INGUINAL HERNIA REPAIR Left 1980  ? age 53  ? KNEE CLOSED REDUCTION Right 08/22/2021  ? Procedure: RIGHT  KNEE MANIPULATION UNDER ANESTHESIA;  Surgeon: Meredith Pel, MD;  Location: [place];  Service: Orthopedics;  Laterality: Right;  ? [place]  ? TOTAL KNEE ARTHROPLASTY Right 07/05/2021  ?  Procedure: TOTAL KNEE ARTHROPLASTY;  Surgeon: Meredith Pel, MD;  Location: [place];  Service: Orthopedics;  Laterality: Right;  ? [place] EXTRACTION  2007  ? ?Patient Active Problem List  ? Diagnosis Date Noted  ? Arthrofibrosis of knee joint, right   ? Arthritis of right knee   ? Diabetic gastroparesis ([place]) 07/20/2021  ? Constipation 07/20/2021  ? S/P total knee arthroplasty, right 07/05/2021  ? Chronic diarrhea 04/29/2021  ? Belching 04/29/2021  ? Primary osteoarthritis of both knees 10/28/2020  ? Irritable bowel syndrome with diarrhea 10/16/2019  ? Tendinitis of right rotator cuff 09/17/2019  ? Hand pain, right 05/21/2019  ? Labral tear of hip joint 05/21/2019  ? Type 2 diabetes mellitus with diabetic neuropathy, unspecified ([place]) 10/01/2018  ? Right hip pain 05/22/2018  ? De Quervain's disease (radial styloid tenosynovitis) 04/02/2018  ? Carpal tunnel syndrome, right upper limb 12/31/2017  ? Elbow injury, right, initial encounter 12/16/2017  ? Chronic pain of both shoulders 11/30/2017  ? PTSD (post-traumatic stress disorder) 10/25/2016  ? 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 Diabetes mellitus ([place]) 10/25/2016  ? Major  depressive disorder, recurrent episode, severe, with psychosis ([place]) 10/20/2016  ? Major depressive disorder, recurrent episode, severe, 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[place]) 10/19/2016  ? TIA (transient ischemic attack) 05/16/2016  ? Arterial ischemic stroke, MCA, left, acute ([place]) 03/29/2016  ? Plantar fasciitis 08/08/2013  ? Obesity, Class III, BMI 40-49.9 (morbid obesity) ([place]) 04/09/2013  ? Unspecified sleep apnea 03/31/2013  ? PCO (polycystic ovaries) 04/11/2012  ? Neck pain 11/21/2010  ? FATTY LIVER DISEASE 01/26/2010  ? NAUSEA WITH VOMITING 01/26/2010  ? PORTAL HYPERTENSION 01/26/2010  ? Migraine headache 06/28/2009  ? BREAST MASS 08/21/2008  ? KNEE PAIN 04/14/2008  ? Hypothyroidism 03/17/2008  ? Hyperlipidemia 03/17/2008  ? DEPRESSION 03/17/2008  ? NEUROPATHY 03/17/2008  ?  Essential hypertension 03/17/2008  ? ASTHMA 03/17/2008  ? GERD 03/17/2008  ? Headache 03/17/2008  ? ? ?REFERRING PROVIDER:  ?Vonita Moss, NP ? ?REFERRING DIAG:  ?pain due to total knee replacement ? ?THERAPY DIAG:  ?Muscle weakness (generalized) ? ?Other abnormalities of gait and mobility ? ?Chronic pain of right knee ? ?ONSET DATE: Chronic ? ?SUBJECTIVE:  ? ?SUBJECTIVE STATEMENT: ?"Hoping the aquatic PT will help with strength.  R knee hurting some today" ? ?Pt presents to PT with chronic hx of R knee pain. Previous R TKA on 07/05/2021 with manipulation and physical therapy. She notes that she has good ROM now, but has continued pain and weakness. Continues to have popping and clicking in R knee with flex/ext. Pt notes she has difficulty with stairs, has not had any instances of buckling.  ? ?PERTINENT HISTORY: ?R TKR- 07/05/2021, depression, PTSD,migraine/ TIA (loss of sensation on L side), L ischemic stroke, T2DM, irregular heartbeat, gastroparesis ? ?PAIN:  ?Are you having pain?  ?Yes: NPRS scale: 4/10 (7/10) ?Pain location: R knee ?Pain description: sharp,  ?Aggravating factors: stairs, knee flexion ?Relieving factors: rest ? ?PRECAUTIONS: None ? ?WEIGHT BEARING RESTRICTIONS No ? ?FALLS:  ?Has patient fallen in last 6 months? Yes. Number of falls one - fall while going down stairs due to ankle twisting ? ?LIVING ENVIRONMENT: ?Lives with: lives with their family ?Lives in: House/apartment ?Stairs: Yes: External: 5 steps; bilateral but cannot reach both ?Has following equipment at home: Single point cane and Walker - 2 wheeled ? 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 ?PLOF: Independent and Independent with basic ADLs ? ?PATIENT GOALS: decrease pain in R knee in order to improve walking and general mobility  ? ? ?OBJECTIVE:  ? ?DIAGNOSTIC FINDINGS:  ? N/A ? ?PATIENT SURVEYS:  ?FOTO 47% function; 55% predicted ? ?COGNITION: ? Overall cognitive status: Within functional limits for tasks  assessed   ?  ?SENSATION: ?Decreased light touch to R lateral LE ? ?POSTURE:  ?Medium body habitus, well healed scar to R knee ? ?PALPATION: ?TTP to medial R knee joint line ? ?LE ROM: ? ?Active ROM Right ?03/01/2022 Left ?03/01/2022  ?Hip flexion    ?Hip extension    ?Hip abduction    ?Hip adduction    ?Hip internal rotation    ?Hip external rotation    ?Knee flexion 110   ?Knee extension 0   ?Ankle dorsiflexion    ?Ankle plantarflexion    ?Ankle inversion    ?Ankle eversion    ? (Blank rows = not tested) ? ?LE MMT: ? ?MMT Right ?03/01/2022 Left ?03/01/2022  ?Hip flexion 3+/5 4+/5  ?Hip extension    ?Hip abduction 3+/5 3+/5  ?Hip adduction    ?Hip internal rotation    ?Hip external rotation    ?Knee  flexion 4/5 4+/5  ?Knee extension 4+/5 5/5  ?Ankle dorsiflexion    ?Ankle plantarflexion    ?Ankle inversion    ?Ankle eversion    ? (Blank rows = not tested) ? ?LOWER EXTREMITY SPECIAL TESTS:  ?N/A ? ?FUNCTIONAL TESTS:  ?30 Second Sit to Stand: 13 reps ? ?GAIT: ?Distance walked: 43f ?Assistive device utilized: None ?Level of assistance: Complete Independence ?Comments: R antalgic gait ? ?TODAY'S TREATMENT: ? ?[place]                                                DATE: 03/01/2022 ?Pt seen for aquatic therapy today.  Treatment took place in water 3.25-4.8 ft [facility] Temp of water was 91?.  Pt entered/exited the pool via stairs (step through pattern) independently with bilat rail. ?Introduction to setting ?Walking ue suppoted by noodle forward and back, sidestepping ? ?Seated ?-stretching hamstring and gastroc 3x25 [place] hold R/L ea ?Flutter kicking; add/abd 3x20 ?STS 3 step x10.  Cues for weigt shift, abdominal bracing and glute engagement ? -cycling ? ? Standing ? -bottom step mini squats x10. Cues for weigh through midfoot and heel for quad and glut engagement ? -hip flex stretch 2x20s hold ?-marching; hip ext; add/abd x10. Cues verbal and tactile, for proper execution and  increased speed as tolerated for increased resistance ? ?Vertical suspension using yellow noodle ?-cylcing; skiing; add/abd ? ?Balance ?Sitting on noode ?-added core engaement with pushing noodle under waree.3x30s ? ?Pt requires buoya

## 2022-03-02 ENCOUNTER — Encounter (HOSPITAL_BASED_OUTPATIENT_CLINIC_OR_DEPARTMENT_OTHER): Payer: Self-pay | Admitting: Physical Therapy

## 2022-03-02 ENCOUNTER — Ambulatory Visit (HOSPITAL_BASED_OUTPATIENT_CLINIC_OR_DEPARTMENT_OTHER): Payer: BC Managed Care – PPO | Admitting: Physical Therapy

## 2022-03-02 DIAGNOSIS — M6281 Muscle weakness (generalized): Secondary | ICD-10-CM

## 2022-03-02 DIAGNOSIS — M25561 Pain in right knee: Secondary | ICD-10-CM | POA: Diagnosis not present

## 2022-03-02 DIAGNOSIS — G8929 Other chronic pain: Secondary | ICD-10-CM

## 2022-03-02 DIAGNOSIS — R2689 Other abnormalities of gait and mobility: Secondary | ICD-10-CM

## 2022-03-02 NOTE — Therapy (Signed)
?OUTPATIENT PHYSICAL THERAPY LOWER EXTREMITY EVALUATION ? ? ?Patient Name: Miranda [name] ?MRN: [id number] ?DOB:[birth year], 46 y.o., female ?Today's Date: 03/02/2022 ? ? PT End of Session - 03/02/22 1207   ? ? Visit Number 3   ? Number of Visits 17   ? Date for PT Re-Evaluation 04/21/22   ? Authorization Type BCBS   ? PT Start Time 1204   ? PT Stop Time 4696   ? PT Time Calculation (min) 41 min   ? ?  ?  ? ?  ? ? ? ?Past Medical History:  ?Diagnosis Date  ? Anginal pain ([place])   ? Anxiety   ? Arthritis   ? Asthma   ? Bowel obstruction ([place])   ? Cardiac arrhythmia   ? Depression   ? sees Dr. Matilde Haymaker in [place]   ? Diabetes mellitus   ? sees Dr. Larna Daughters Averneni   ? Dyspnea   ? Dysrhythmia   ? GERD (gastroesophageal reflux disease)   ? Hyperlipidemia   ? Hypertension   ? Hypothyroidism   ? sees Dr. Elyse Hsu  ? Insomnia   ? Migraine syndrome   ? sees [facility] at [facility]   ? Morbid obesity [facility])   ? 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 Neck pain   ? Neuropathy associated with endocrine disorder ([place])   ? Sleep apnea with use of continuous positive airway pressure (CPAP)   ? Sleep apnea is resolved due to weight loss. 08/2017  ? Stroke ([place]) 03/25/2016  ? left MCA   ? Stroke [facility]) 04/2016  ? sees Dr. Teod[facility] Medici at [facility]   ? ?Past Surgical History:  ?Procedure Laterality Date  ? blocked itestinal repair  79  ? age 19 months  ? BREAST BIOPSY Left 2017  ? CHOLECYSTECTOMY  2011  ? INDUCED ABORTION  1996  ? forced abortion  ? INGUINAL HERNIA REPAIR Left 1980  ? age 63  ? KNEE CLOSED REDUCTION Right 08/22/2021  ? Procedure: RIGHT  KNEE MANIPULATION UNDER ANESTHESIA;  Surgeon: Meredith Pel, MD;  Location: [place];  Service: Orthopedics;  Laterality: Right;  ? [place]  ? TOTAL KNEE ARTHROPLASTY Right 07/05/2021  ? Procedure: TOTAL KNEE ARTHROPLASTY;  Surgeon: Meredith Pel, MD;  Location: [place];  Service: Orthopedics;  Laterality: Right;  ?  [place] EXTRACTION  2007  ? ?Patient Active Problem List  ? Diagnosis Date Noted  ? Arthrofibrosis of knee joint, right   ? Arthritis of right knee   ? Diabetic gastroparesis ([place]) 07/20/2021  ? Constipation 07/20/2021  ? S/P total knee arthroplasty, right 07/05/2021  ? Chronic diarrhea 04/29/2021  ? Belching 04/29/2021  ? Primary osteoarthritis of both knees 10/28/2020  ? Irritable bowel syndrome with diarrhea 10/16/2019  ? Tendinitis of right rotator cuff 09/17/2019  ? Hand pain, right 05/21/2019  ? Labral tear of hip joint 05/21/2019  ? Type 2 diabetes mellitus with diabetic neuropathy, unspecified ([place]) 10/01/2018  ? Right hip pain 05/22/2018  ? De Quervain's disease (radial styloid tenosynovitis) 04/02/2018  ? Carpal tunnel syndrome, right upper limb 12/31/2017  ? Elbow injury, right, initial encounter 12/16/2017  ? Chronic pain of both shoulders 11/30/2017  ? PTSD (post-traumatic stress disorder) 10/25/2016  ? 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 Diabetes mellitus ([place]) 10/25/2016  ? Major depressive disorder, recurrent episode, severe, with psychosis ([place]) 10/20/2016  ? Major depressive disorder, recurrent episode, severe, 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[place]) 10/19/2016  ?  TIA (transient ischemic attack) 05/16/2016  ? Arterial ischemic stroke, MCA, left, acute ([place]) 03/29/2016  ? Plantar fasciitis 08/08/2013  ? Obesity, Class III, BMI 40-49.9 (morbid obesity) ([place]) 04/09/2013  ? Unspecified sleep apnea 03/31/2013  ? PCO (polycystic ovaries) 04/11/2012  ? Neck pain 11/21/2010  ? FATTY LIVER DISEASE 01/26/2010  ? NAUSEA WITH VOMITING 01/26/2010  ? PORTAL HYPERTENSION 01/26/2010  ? Migraine headache 06/28/2009  ? BREAST MASS 08/21/2008  ? KNEE PAIN 04/14/2008  ? Hypothyroidism 03/17/2008  ? Hyperlipidemia 03/17/2008  ? DEPRESSION 03/17/2008  ? NEUROPATHY 03/17/2008  ? Essential hypertension 03/17/2008  ? ASTHMA 03/17/2008  ? GERD 03/17/2008  ? Headache 03/17/2008  ? ? ?REFERRING PROVIDER:  ?Vonita Moss, NP ? ?REFERRING DIAG:  ?pain due to total knee replacement ? ?THERAPY DIAG:  ?Muscle weakness (generalized) ? ?Other abnormalities of gait and mobility ? ?Chronic pain of right knee ? ?Chronic pain of left knee ? ?ONSET DATE: Chronic ? ?SUBJECTIVE:  ? ?SUBJECTIVE STATEMENT: ?"I'm feeling yesterdays exercises today.  Feels stiff" ? ? ? ?PERTINENT HISTORY: ?R TKR- 07/05/2021, depression, PTSD,migraine/ TIA (loss of sensation on L side), L ischemic stroke, T2DM, irregular heartbeat, gastroparesis ? ?PAIN:  ?Are you having pain?  ?Yes: NPRS scale: 2/10 (7/10) ?Pain location: R knee ?Pain description: sharp,  ?Aggravating factors: stairs, knee flexion ?Relieving factors: rest ? ?PRECAUTIONS: None ? ?WEIGHT BEARING RESTRICTIONS No ? ?FALLS:  ?Has patient fallen in last 6 months? Yes. Number of falls one - fall while going down stairs due to ankle twisting ? ?LIVING ENVIRONMENT: ?Lives with: lives with their family ?Lives in: House/apartment ?Stairs: Yes: External: 5 steps; bilateral but cannot reach both ?Has following equipment at home: Single point cane and Walker - 2 wheeled ? 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 ?PLOF: Independent and Independent with basic ADLs ? ?PATIENT GOALS: decrease pain in R knee in order to improve walking and general mobility  ? ? ?OBJECTIVE:  ? ?DIAGNOSTIC FINDINGS:  ? N/A ? ?PATIENT SURVEYS:  ?FOTO 47% function; 55% predicted ? ?COGNITION: ? Overall cognitive status: Within functional limits for tasks assessed   ?  ?SENSATION: ?Decreased light touch to R lateral LE ? ?POSTURE:  ?Medium body habitus, well healed scar to R knee ? ?PALPATION: ?TTP to medial R knee joint line ? ?LE ROM: ? ?Active ROM Right ?03/02/2022 Left ?03/02/2022  ?Hip flexion    ?Hip extension    ?Hip abduction    ?Hip adduction    ?Hip internal rotation    ?Hip external rotation    ?Knee flexion 110   ?Knee extension 0   ?Ankle dorsiflexion    ?Ankle plantarflexion    ?Ankle inversion    ?Ankle eversion    ? (Blank  rows = not tested) ? ?LE MMT: ? ?MMT Right ?03/02/2022 Left ?03/02/2022  ?Hip flexion 3+/5 4+/5  ?Hip extension    ?Hip abduction 3+/5 3+/5  ?Hip adduction    ?Hip internal rotation    ?Hip external rotation    ?Knee flexion 4/5 4+/5  ?Knee extension 4+/5 5/5  ?Ankle dorsiflexion    ?Ankle plantarflexion    ?Ankle inversion    ?Ankle eversion    ? (Blank rows = not tested) ? ?LOWER EXTREMITY SPECIAL TESTS:  ?N/A ? ?FUNCTIONAL TESTS:  ?30 Second Sit to Stand: 13 reps ? ?GAIT: ?Distance walked: 94f ?Assistive device utilized: None ?Level of assistance: Complete Independence ?Comments: R antalgic gait ? ?TODAY'S TREATMENT: ? ? [place]:  DATE: 03/02/2022 ?Pt seen for aquatic therapy today.  Treatment took place in water 3.25-4.8 ft in depth at the [employer] pool. Temp of water was 91?.  Pt entered/exited the pool via stairs (step through pattern) independently with bilat rail. ? ?Walking ue suppoted by noodle forward and back, sidestepping ? ?Sitting on noodle ?cylcing; skiing; add/abd  ?Add/abd (jumping jacks) 2x10 ?Scissor jumps 2x10 ? ?Seated ?Flutter kicking; add/abd 3x20 ea ?STS 3 step x10.  Cues for weight shift, abdominal bracing and glute engagement ?Adduct set using buoyancy ball 10s hold x10 ?STS with static abd squeezing ball x5 for added core engagement.  Cues for core control. ?  ? ? Standing ? -gastroc stretch bottom step 3x20s hold R/L ? -runners stretch hamstring 3x20s R/L ? -quad stretch 3x20s hold kneeling on bottom step. ? ? Viscosity of the water is needed for resistance of strengthening; water current perturbations provides challenge to standing balance unsupported, requiring increased core activation. ? ? ?[facility] Adult PT Treatment:                                                DATE: 02/23/2022 ?Therapeutic Exercise: ?SLR x 5 ?Supine clamshell x 15 GTB ?Standing TKE x 10 R - 5" ? ?PATIENT EDUCATION:  ?Education details: eval  findings, FOTO, HEP, POC ?Person educated: Patient ?Education method: Explanation, Demonstration, and Handouts ?Education comprehension: verbalized understanding and returned demonstration ? ? ?[place]

## 2022-03-06 ENCOUNTER — Encounter (HOSPITAL_BASED_OUTPATIENT_CLINIC_OR_DEPARTMENT_OTHER): Payer: Self-pay | Admitting: Physical Therapy

## 2022-03-06 ENCOUNTER — Ambulatory Visit (HOSPITAL_BASED_OUTPATIENT_CLINIC_OR_DEPARTMENT_OTHER): Payer: BC Managed Care – PPO | Admitting: Physical Therapy

## 2022-03-06 DIAGNOSIS — M6281 Muscle weakness (generalized): Secondary | ICD-10-CM

## 2022-03-06 DIAGNOSIS — G8929 Other chronic pain: Secondary | ICD-10-CM

## 2022-03-06 DIAGNOSIS — R2689 Other abnormalities of gait and mobility: Secondary | ICD-10-CM

## 2022-03-06 DIAGNOSIS — M25561 Pain in right knee: Secondary | ICD-10-CM | POA: Diagnosis not present

## 2022-03-06 NOTE — Therapy (Addendum)
?OUTPATIENT PHYSICAL THERAPY LOWER EXTREMITY EVALUATION ? ? ?Patient Name: Miranda [name] ?MRN: [id number] ?DOB:03/30/1976, 46 y.o., female ?Today's Date: 03/06/2022 ? ? PT End of Session - 03/06/22 1201   ? ? Visit Number 4   ? Number of Visits 17   ? Date for PT Re-Evaluation 04/21/22   ? Authorization Type BCBS   ? PT Start Time 1202   ? PT Stop Time 9150   ? PT Time Calculation (min) 43 min   ? ?  ?  ? ?  ? ? ? ?Past Medical History:  ?Diagnosis Date  ? Anginal pain ([place])   ? Anxiety   ? Arthritis   ? Asthma   ? Bowel obstruction ([place])   ? Cardiac arrhythmia   ? Depression   ? sees Dr. Matilde Haymaker in [place]   ? Diabetes mellitus   ? sees Dr. Larna Daughters [place]   ? Dyspnea   ? Dysrhythmia   ? GERD (gastroesophageal reflux disease)   ? Hyperlipidemia   ? Hypertension   ? Hypothyroidism   ? sees Dr. Elyse Hsu  ? Insomnia   ? Migraine syndrome   ? [facility]od[facility] at [facility]   ? Morbid obesity [facility])   ? 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 Neck pain   ? Neuropathy associated with endocrine disorder ([place])   ? Sleep apnea with use of continuous positive airway pressure (CPAP)   ? Sleep apnea is resolved due to weight loss. 08/2017  ? Stroke ([place]) 03/25/2016  ? left MCA   ? Stroke [facility]) 04/2016  ? sees Dr. Teodora Medici at [facility]   ? ?Past Surgical History:  ?Procedure Laterality Date  ? blocked itestinal repair  10  ? age 53 months  ? BREAST BIOPSY Left 2017  ? CHOLECYSTECTOMY  2011  ? INDUCED ABORTION  1996  ? forced abortion  ? INGUINAL HERNIA REPAIR Left 1980  ? age 82  ? KNEE CLOSED REDUCTION Right 08/22/2021  ? Procedure: RIGHT  KNEE MANIPULATION UNDER ANESTHESIA;  Surgeon: Meredith Pel, MD;  Location: [place];  Service: Orthopedics;  Laterality: Right;  ? [place]  ? TOTAL KNEE ARTHROPLASTY Right 07/05/2021  ? Procedure: TOTAL KNEE ARTHROPLASTY;  Surgeon: Meredith Pel, MD;  Location: [place];  Service: Orthopedics;  Laterality: Right;  ?  [place] EXTRACTION  2007  ? ?Patient Active Problem List  ? Diagnosis Date Noted  ? Arthrofibrosis of knee joint, right   ? Arthritis of right knee   ? Diabetic gastroparesis ([place]) 07/20/2021  ? Constipation 07/20/2021  ? S/P total knee arthroplasty, right 07/05/2021  ? Chronic diarrhea 04/29/2021  ? Belching 04/29/2021  ? Primary osteoarthritis of both knees 10/28/2020  ? Irritable bowel syndrome with diarrhea 10/16/2019  ? Tendinitis of right rotator cuff 09/17/2019  ? Hand pain, right 05/21/2019  ? Labral tear of hip joint 05/21/2019  ? Type 2 diabetes mellitus with diabetic neuropathy, unspecified ([place]) 10/01/2018  ? Right hip pain 05/22/2018  ? De Quervain's disease (radial styloid tenosynovitis) 04/02/2018  ? Carpal tunnel syndrome, right upper limb 12/31/2017  ? Elbow injury, right, initial encounter 12/16/2017  ? Chronic pain of both shoulders 11/30/2017  ? PTSD (post-traumatic stress disorder) 10/25/2016  ? 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 Diabetes mellitus ([place]) 10/25/2016  ? Major depressive disorder, recurrent episode, severe, with psychosis ([place]) 10/20/2016  ? Major depressive disorder, recurrent episode, severe, 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[place]) 10/19/2016  ?  TIA (transient ischemic attack) 05/16/2016  ? Arterial ischemic stroke, MCA, left, acute ([place]) 03/29/2016  ? Plantar fasciitis 08/08/2013  ? Obesity, Class III, BMI 40-49.9 (morbid obesity) ([place]) 04/09/2013  ? Unspecified sleep apnea 03/31/2013  ? PCO (polycystic ovaries) 04/11/2012  ? Neck pain 11/21/2010  ? FATTY LIVER DISEASE 01/26/2010  ? NAUSEA WITH VOMITING 01/26/2010  ? PORTAL HYPERTENSION 01/26/2010  ? Migraine headache 06/28/2009  ? BREAST MASS 08/21/2008  ? KNEE PAIN 04/14/2008  ? Hypothyroidism 03/17/2008  ? Hyperlipidemia 03/17/2008  ? DEPRESSION 03/17/2008  ? NEUROPATHY 03/17/2008  ? Essential hypertension 03/17/2008  ? ASTHMA 03/17/2008  ? GERD 03/17/2008  ? Headache 03/17/2008  ? ? ?REFERRING PROVIDER:  ?Vonita Moss, NP ? ?REFERRING DIAG:  ?pain due to total knee replacement ? ?THERAPY DIAG:  ?Muscle weakness (generalized) ? ?Other abnormalities of gait and mobility ? ?Chronic pain of right knee ? ?Chronic pain of left knee ? ?ONSET DATE: Chronic ? ?SUBJECTIVE:  ? ?SUBJECTIVE STATEMENT: ?"I'm feeling it a little today" ?Pt reports good response to last visit. ? ? ?PERTINENT HISTORY: ?R TKR- 07/05/2021, depression, PTSD,migraine/ TIA (loss of sensation on L side), L ischemic stroke, T2DM, irregular heartbeat, gastroparesis ? ?PAIN:  ?Are you having pain?  ?Yes: NPRS scale: 4/10 (7/10) ?Pain location: R knee ?Pain description: sharp,  ?Aggravating factors: stairs, knee flexion ?Relieving factors: rest ? ?PRECAUTIONS: None ? ?WEIGHT BEARING RESTRICTIONS No ? ?FALLS:  ?Has patient fallen in last 6 months? Yes. Number of falls one - fall while going down stairs due to ankle twisting ? ?LIVING ENVIRONMENT: ?Lives with: lives with their family ?Lives in: House/apartment ?Stairs: Yes: External: 5 steps; bilateral but cannot reach both ?Has following equipment at home: Single point cane and Walker - 2 wheeled ? 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 ?PLOF: Independent and Independent with basic ADLs ? ?PATIENT GOALS: decrease pain in R knee in order to improve walking and general mobility  ? ? ?OBJECTIVE:  ? ?DIAGNOSTIC FINDINGS:  ? N/A ? ?PATIENT SURVEYS:  ?FOTO 47% function; 55% predicted ? ?COGNITION: ? Overall cognitive status: Within functional limits for tasks assessed   ?  ?SENSATION: ?Decreased light touch to R lateral LE ? ?POSTURE:  ?Medium body habitus, well healed scar to R knee ? ?PALPATION: ?TTP to medial R knee joint line ? ?LE ROM: ? ?Active ROM Right ?03/06/2022 Left ?03/06/2022  ?Hip flexion    ?Hip extension    ?Hip abduction    ?Hip adduction    ?Hip internal rotation    ?Hip external rotation    ?Knee flexion 110   ?Knee extension 0   ?Ankle dorsiflexion    ?Ankle plantarflexion    ?Ankle inversion    ?Ankle  eversion    ? (Blank rows = not tested) ? ?LE MMT: ? ?MMT Right ?03/06/2022 Left ?03/06/2022  ?Hip flexion 3+/5 4+/5  ?Hip extension    ?Hip abduction 3+/5 3+/5  ?Hip adduction    ?Hip internal rotation    ?Hip external rotation    ?Knee flexion 4/5 4+/5  ?Knee extension 4+/5 5/5  ?Ankle dorsiflexion    ?Ankle plantarflexion    ?Ankle inversion    ?Ankle eversion    ? (Blank rows = not tested) ? ?LOWER EXTREMITY SPECIAL TESTS:  ?N/A ? ?FUNCTIONAL TESTS:  ?30 Second Sit to Stand: 13 reps ? ?GAIT: ?Distance walked: 45f ?Assistive device utilized: None ?Level of assistance: Complete Independence ?Comments: R antalgic gait ? ?TODAY'S TREATMENT: ? ? ?[facility]  DATE: 03/06/2022 ?Pt seen for aquatic therapy today.  Treatment took place in water 3.25-4.8 ft in depth at the [facility] pool. Temp of water was 91?.  Pt entered/exited the pool via stairs (step through pattern) independently with bilat rail. ? ?Walking ue suppoted by noodle forward and back, sidestepping ? ?Standing ? -gastroc stretch bottom step 3x20s hold R/L ? -runners stretch hamstring 3x20s R/L ? -quad stretch 3x20s foot on 2nd step. ? -toe raises on bottom step x10 ? -holding to wall clamshell R/L x10 ? -It band static stretch 2x30s hold ? -Side lunges with Ue add/abd using 1 foam hand buoys 4 widths ? -LAQ with blue noodle supporting knee and ankle buoy resisting 2x12 ? - step ups forward then retro x10 leading R/L ? -CC right TKE on step x 10 ? ?Sitting on noodle ?cylcing; skiing; add/abd  ? ? Viscosity of the water is needed for resistance of strengthening; water current perturbations provides challenge to standing balance unsupported, requiring increased core activation. ? ? ?[facility] Adult PT Treatment:                                                DATE: 02/23/2022 ?Therapeutic Exercise: ?SLR x 5 ?Supine clamshell x 15 GTB ?Standing TKE x 10 R - 5" ? ?PATIENT EDUCATION:  ?Education  details: eval findings, FOTO, HEP, POC ?Person educated: Patient ?Education method: Explanation, Demonstration, and Handouts ?Education comprehension: verbalized understanding and returned demonstration ? ? ?HOME EXERCIS

## 2022-03-07 NOTE — Addendum Note (Signed)
Addended by: Ward Chatters on: 03/07/2022 05:10 PM ? ? Modules accepted: Orders ? ?

## 2022-03-09 ENCOUNTER — Ambulatory Visit: Payer: BC Managed Care – PPO | Attending: Family Medicine

## 2022-03-09 DIAGNOSIS — M6281 Muscle weakness (generalized): Secondary | ICD-10-CM | POA: Insufficient documentation

## 2022-03-09 DIAGNOSIS — R2689 Other abnormalities of gait and mobility: Secondary | ICD-10-CM | POA: Insufficient documentation

## 2022-03-09 NOTE — Therapy (Signed)
?OUTPATIENT PHYSICAL THERAPY LOWER EXTREMITY EVALUATION ? ? ?Patient Name: Miranda [name] ?MRN: [id number] ?DOB:1976/02/02, 46 y.o., female ?Today's Date: 03/09/2022 ? ? PT End of Session - 03/09/22 1447   ? ? Visit Number 5   ? Number of Visits 17   ? Date for PT Re-Evaluation 04/21/22   ? Authorization Type BCBS   ? PT Start Time 5170   ? PT Stop Time 1524   ? PT Time Calculation (min) 37 min   ? ?  ?  ? ?  ? ? ? ? ?Past Medical History:  ?Diagnosis Date  ? Anginal pain ([place])   ? Anxiety   ? Arthritis   ? Asthma   ? Bowel obstruction ([place])   ? Cardiac arrhythmia   ? Depression   ? sees Dr. Matilde Haymaker in [place]   ? Diabetes mellitus   ? sees Dr. Larna Daughters [place]   ? Dyspnea   ? Dysrhythmia   ? GERD (gastroesophageal reflux disease)   ? Hyperlipidemia   ? Hypertension   ? Hypothyroidism   ? sees Dr. Elyse Hsu  ? Insomnia   ? Migraine syndrome   ? sees Dr. Teodora Medici at [facility]   ? Morbid obesity [facility])   ? 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 Neck pain   ? Neuropathy associated with endocrine disorder ([place])   ? Sleep apnea with use of continuous positive airway pressure (CPAP)   ? Sleep apnea is resolved due to weight loss. 08/2017  ? Stroke ([place]) 03/25/2016  ? left MCA   ? Stroke [facility]) 04/2016  ? sees [facility] at [facility]   ? ?Past Surgical History:  ?Procedure Laterality Date  ? blocked itestinal repair  79  ? age 81 months  ? BREAST BIOPSY Left 2017  ? CHOLECYSTECTOMY  2011  ? INDUCED ABORTION  1996  ? forced abortion  ? INGUINAL HERNIA REPAIR Left 1980  ? age 49  ? KNEE CLOSED REDUCTION Right 08/22/2021  ? Procedure: RIGHT  KNEE MANIPULATION UNDER ANESTHESIA;  Surgeon: Meredith Pel, MD;  Location: [place];  Service: Orthopedics;  Laterality: Right;  ? [place]  ? TOTAL KNEE ARTHROPLASTY Right 07/05/2021  ? Procedure: TOTAL KNEE ARTHROPLASTY;  Surgeon: Meredith Pel, MD;  Location: [place];  Service: Orthopedics;  Laterality: Right;  ?  [place] EXTRACTION  2007  ? ?Patient Active Problem List  ? Diagnosis Date Noted  ? Arthrofibrosis of knee joint, right   ? Arthritis of right knee   ? Diabetic gastroparesis ([place]) 07/20/2021  ? Constipation 07/20/2021  ? S/P total knee arthroplasty, right 07/05/2021  ? Chronic diarrhea 04/29/2021  ? Belching 04/29/2021  ? Primary osteoarthritis of both knees 10/28/2020  ? Irritable bowel syndrome with diarrhea 10/16/2019  ? Tendinitis of right rotator cuff 09/17/2019  ? Hand pain, right 05/21/2019  ? Labral tear of hip joint 05/21/2019  ? Type 2 diabetes mellitus with diabetic neuropathy, unspecified ([place]) 10/01/2018  ? Right hip pain 05/22/2018  ? De Quervain's disease (radial styloid tenosynovitis) 04/02/2018  ? Carpal tunnel syndrome, right upper limb 12/31/2017  ? Elbow injury, right, initial encounter 12/16/2017  ? Chronic pain of both shoulders 11/30/2017  ? PTSD (post-traumatic stress disorder) 10/25/2016  ? 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 Diabetes mellitus ([place]) 10/25/2016  ? Major depressive disorder, recurrent episode, severe, with psychosis ([place]) 10/20/2016  ? Major depressive disorder, recurrent episode, severe, 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[place])  10/19/2016  ? TIA (transient ischemic attack) 05/16/2016  ? Arterial ischemic stroke, MCA, left, acute ([place]) 03/29/2016  ? Plantar fasciitis 08/08/2013  ? Obesity, Class III, BMI 40-49.9 (morbid obesity) ([place]) 04/09/2013  ? Unspecified sleep apnea 03/31/2013  ? PCO (polycystic ovaries) 04/11/2012  ? Neck pain 11/21/2010  ? FATTY LIVER DISEASE 01/26/2010  ? NAUSEA WITH VOMITING 01/26/2010  ? PORTAL HYPERTENSION 01/26/2010  ? Migraine headache 06/28/2009  ? BREAST MASS 08/21/2008  ? KNEE PAIN 04/14/2008  ? Hypothyroidism 03/17/2008  ? Hyperlipidemia 03/17/2008  ? DEPRESSION 03/17/2008  ? NEUROPATHY 03/17/2008  ? Essential hypertension 03/17/2008  ? ASTHMA 03/17/2008  ? GERD 03/17/2008  ? Headache 03/17/2008  ? ? ?REFERRING PROVIDER:  ?Vonita Moss, NP ? ?REFERRING DIAG:  ?pain due to total knee replacement ? ?THERAPY DIAG:  ?Muscle weakness (generalized) ? ?Other abnormalities of gait and mobility ? ?ONSET DATE: Chronic ? ?SUBJECTIVE:  ? ?SUBJECTIVE STATEMENT: ? Pt presents to PT with continued bilateral knee pain, R>L. Has been compliant with HEP and has enjoyed aquatic therapy thus far. She is ready to begin PT at this time.  ? ? ?PERTINENT HISTORY: ?R TKR- 07/05/2021, depression, PTSD,migraine/ TIA (loss of sensation on L side), L ischemic stroke, T2DM, irregular heartbeat, gastroparesis ? ?PAIN:  ?Are you having pain?  ?Yes: NPRS scale: 5/10 (7/10) ?Pain location: R knee ?Pain description: sharp,  ?Aggravating factors: stairs, knee flexion ?Relieving factors: rest ? ?PRECAUTIONS: None ? ?WEIGHT BEARING RESTRICTIONS No ? ?FALLS:  ?Has patient fallen in last 6 months? Yes. Number of falls one - fall while going down stairs due to ankle twisting ? ?LIVING ENVIRONMENT: ?Lives with: lives with their family ?Lives in: House/apartment ?Stairs: Yes: External: 5 steps; bilateral but cannot reach both ?Has following equipment at home: Single point cane and Walker - 2 wheeled ? 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 ?PLOF: Independent and Independent with basic ADLs ? ?PATIENT GOALS: decrease pain in R knee in order to improve walking and general mobility  ? ? ?OBJECTIVE:  ? ?DIAGNOSTIC FINDINGS:  ? N/A ? ?PATIENT SURVEYS:  ?FOTO 47% function; 55% predicted ? ?COGNITION: ? Overall cognitive status: Within functional limits for tasks assessed   ?  ?SENSATION: ?Decreased light touch to R lateral LE ? ?POSTURE:  ?Medium body habitus, well healed scar to R knee ? ?PALPATION: ?TTP to medial R knee joint line ? ?LE ROM: ? ?Active ROM Right ?03/09/2022 Left ?03/09/2022  ?Hip flexion    ?Hip extension    ?Hip abduction    ?Hip adduction    ?Hip internal rotation    ?Hip external rotation    ?Knee flexion 110   ?Knee extension 0   ?Ankle dorsiflexion    ?Ankle  plantarflexion    ?Ankle inversion    ?Ankle eversion    ? (Blank rows = not tested) ? ?LE MMT: ? ?MMT Right ?03/09/2022 Left ?03/09/2022  ?Hip flexion 3+/5 4+/5  ?Hip extension    ?Hip abduction 3+/5 3+/5  ?Hip adduction    ?Hip internal rotation    ?Hip external rotation    ?Knee flexion 4/5 4+/5  ?Knee extension 4+/5 5/5  ?Ankle dorsiflexion    ?Ankle plantarflexion    ?Ankle inversion    ?Ankle eversion    ? (Blank rows = not tested) ? ?LOWER EXTREMITY SPECIAL TESTS:  ?N/A ? ?FUNCTIONAL TESTS:  ?30 Second Sit to Stand: 13 reps ? ?GAIT: ?Distance walked: 58f ?Assistive device utilized: None ?Level of assistance: Complete Independence ?Comments: R  antalgic gait ? ?TODAY'S TREATMENT: ?[facility] Adult PT Treatment:                                                DATE: 03/09/2022 ?Therapeutic Exercise: ?NuStep lvl 5 UE/LE x 3 min while taking subjective  ?Standing mini quat 2x10  ?LAQ 2x10 4# ?STS holding 10# KB ?Supine SLR 2x15 each ?S/L clamshell 2x10 GTB ?Standing hip abd 2x10 25# ?Eccentric step down R 2x10 2in ?Modalities: ?Gameready  ?10 min ?Low compression ?38 deg ? ?[facility] Adult PT Treatment:                                                DATE: 03/02/2022 ?Pt seen for aquatic therapy today.  Treatment took place in water 3.25-4.8 ft in depth at the [facility] pool. Temp of water was 91?.  Pt entered/exited the pool via stairs (step through pattern) independently with bilat rail. ? ?Walking ue suppoted by noodle forward and back, sidestepping ? ?Standing ? -gastroc stretch bottom step 3x20s hold R/L ? -runners stretch hamstring 3x20s R/L ? -quad stretch 3x20s foot on 2nd step. ? -toe raises on bottom step x10 ? -holding to wall clamshell R/L x10 ? -It band static stretch 2x30s hold ? -Side lunges with Ue add/abd using 1 foam hand buoys 4 widths ? -LAQ with blue noodle supporting knee and ankle buoy resisting 2x12 ? - step ups forward then retro x10 leading R/L ? -CC right TKE on step x 10 ? ?Sitting on  noodle ?cylcing; skiing; add/abd  ? ? Viscosity of the water is needed for resistance of strengthening; water current perturbations provides challenge to standing balance unsupported, requiring increased core activation. ?

## 2022-03-14 ENCOUNTER — Ambulatory Visit (HOSPITAL_BASED_OUTPATIENT_CLINIC_OR_DEPARTMENT_OTHER): Payer: BC Managed Care – PPO | Admitting: Physical Therapy

## 2022-03-14 ENCOUNTER — Encounter (HOSPITAL_BASED_OUTPATIENT_CLINIC_OR_DEPARTMENT_OTHER): Payer: Self-pay | Admitting: Physical Therapy

## 2022-03-14 DIAGNOSIS — M25561 Pain in right knee: Secondary | ICD-10-CM | POA: Diagnosis not present

## 2022-03-14 DIAGNOSIS — M6281 Muscle weakness (generalized): Secondary | ICD-10-CM

## 2022-03-14 DIAGNOSIS — G8929 Other chronic pain: Secondary | ICD-10-CM

## 2022-03-14 DIAGNOSIS — R2689 Other abnormalities of gait and mobility: Secondary | ICD-10-CM

## 2022-03-14 NOTE — Therapy (Signed)
?OUTPATIENT PHYSICAL THERAPY LOWER EXTREMITY EVALUATION ? ? ?Patient Name: Miranda [name] ?MRN: [id number] ?DOB:05/01/1976, 46 y.o., female ?Today's Date: 03/14/2022 ? ? PT End of Session - 03/14/22 1307   ? ? Visit Number 6   ? Number of Visits 17   ? Date for PT Re-Evaluation 04/21/22   ? Authorization Type BCBS   ? PT Start Time 1210   ? PT Stop Time 8756   ? PT Time Calculation (min) 45 min   ? ?  ?  ? ?  ? ? ? ? ?Past Medical History:  ?Diagnosis Date  ? Anginal pain ([place])   ? Anxiety   ? Arthritis   ? Asthma   ? Bowel obstruction ([place])   ? Cardiac arrhythmia   ? Depression   ? sees Dr. Matilde Haymaker in [place]   ? Diabetes mellitus   ? sees Dr. Larna Daughters [place]ver[place]i   ? Dyspnea   ? Dysrhythmia   ? GERD (gastroesophageal reflux disease)   ? Hyperlipidemia   ? Hypertension   ? Hypothyroidism   ? sees Dr. Elyse Hsu  ? Insomnia   ? Migraine syndrome   ? sees Dr. Teodora Medici at [facility]   ? Morbid obesity [facility])   ? 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 Neck pain   ? Neuropathy associated with endocrine disorder ([place])   ? Sleep apnea with use of continuous positive airway pressure (CPAP)   ? Sleep apnea is resolved due to weight loss. 08/2017  ? Stroke ([place]) 03/25/2016  ? left MCA   ? Stroke [facility]) 04/2016  ? sees Dr. Teodora Medici at [facility]   ? ?Past Surgical History:  ?Procedure Laterality Date  ? blocked itestinal repair  21  ? age 85 months  ? BREAST BIOPSY Left 2017  ? CHOLECYSTECTOMY  2011  ? INDUCED ABORTION  1996  ? forced abortion  ? INGUINAL HERNIA REPAIR Left 1980  ? age 6  ? KNEE CLOSED REDUCTION Right 08/22/2021  ? Procedure: RIGHT  KNEE MANIPULATION UNDER ANESTHESIA;  Surgeon: Meredith Pel, MD;  Location: [place];  Service: Orthopedics;  Laterality: Right;  ? [place]  ? TOTAL KNEE ARTHROPLASTY Right 07/05/2021  ? Procedure: TOTAL KNEE ARTHROPLASTY;  Surgeon: Meredith Pel, MD;  Location: [place];  Service: Orthopedics;  Laterality: Right;  ?  [place] EXTRACTION  2007  ? ?Patient Active Problem List  ? Diagnosis Date Noted  ? Arthrofibrosis of knee joint, right   ? Arthritis of right knee   ? Diabetic gastroparesis ([place]) 07/20/2021  ? Constipation 07/20/2021  ? S/P total knee arthroplasty, right 07/05/2021  ? Chronic diarrhea 04/29/2021  ? Belching 04/29/2021  ? Primary osteoarthritis of both knees 10/28/2020  ? Irritable bowel syndrome with diarrhea 10/16/2019  ? Tendinitis of right rotator cuff 09/17/2019  ? Hand pain, right 05/21/2019  ? Labral tear of hip joint 05/21/2019  ? Type 2 diabetes mellitus with diabetic neuropathy, unspecified ([place]) 10/01/2018  ? Right hip pain 05/22/2018  ? De Quervain's disease (radial styloid tenosynovitis) 04/02/2018  ? Carpal tunnel syndrome, right upper limb 12/31/2017  ? Elbow injury, right, initial encounter 12/16/2017  ? Chronic pain of both shoulders 11/30/2017  ? PTSD (post-traumatic stress disorder) 10/25/2016  ? 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 Diabetes mellitus ([place]) 10/25/2016  ? Major depressive disorder, recurrent episode, severe, with psychosis ([place]) 10/20/2016  ? Major depressive disorder, recurrent episode, severe, 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[place])  10/19/2016  ? TIA (transient ischemic attack) 05/16/2016  ? Arterial ischemic stroke, MCA, left, acute ([place]) 03/29/2016  ? Plantar fasciitis 08/08/2013  ? Obesity, Class III, BMI 40-49.9 (morbid obesity) ([place]) 04/09/2013  ? Unspecified sleep apnea 03/31/2013  ? PCO (polycystic ovaries) 04/11/2012  ? Neck pain 11/21/2010  ? FATTY LIVER DISEASE 01/26/2010  ? NAUSEA WITH VOMITING 01/26/2010  ? PORTAL HYPERTENSION 01/26/2010  ? Migraine headache 06/28/2009  ? BREAST MASS 08/21/2008  ? KNEE PAIN 04/14/2008  ? Hypothyroidism 03/17/2008  ? Hyperlipidemia 03/17/2008  ? DEPRESSION 03/17/2008  ? NEUROPATHY 03/17/2008  ? Essential hypertension 03/17/2008  ? ASTHMA 03/17/2008  ? GERD 03/17/2008  ? Headache 03/17/2008  ? ? ?REFERRING PROVIDER:  ?Vonita Moss, NP ? ?REFERRING DIAG:  ?pain due to total knee replacement ? ?THERAPY DIAG:  ?Muscle weakness (generalized) ? ?Other abnormalities of gait and mobility ? ?Chronic pain of right knee ? ?Chronic pain of left knee ? ?ONSET DATE: Chronic ? ?SUBJECTIVE:  ? ?SUBJECTIVE STATEMENT: ?"busy weekend I feel ok.  Hurt after last on land session, used ice pack" ? ? ?PERTINENT HISTORY: ?R TKR- 07/05/2021, depression, PTSD,migraine/ TIA (loss of sensation on L side), L ischemic stroke, T2DM, irregular heartbeat, gastroparesis ? ?PAIN:  ?Are you having pain?  ?Yes: NPRS scale: 4/10 (7/10) ?Pain location: R knee ?Pain description: sharp,  ?Aggravating factors: stairs, knee flexion ?Relieving factors: rest ? ?PRECAUTIONS: None ? ?WEIGHT BEARING RESTRICTIONS No ? ?FALLS:  ?Has patient fallen in last 6 months? Yes. Number of falls one - fall while going down stairs due to ankle twisting ? ?LIVING ENVIRONMENT: ?Lives with: lives with their family ?Lives in: House/apartment ?Stairs: Yes: External: 5 steps; bilateral but cannot reach both ?Has following equipment at home: Single point cane and Walker - 2 wheeled ? 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 ?PLOF: Independent and Independent with basic ADLs ? ?PATIENT GOALS: decrease pain in R knee in order to improve walking and general mobility  ? ? ?OBJECTIVE:  ? ?DIAGNOSTIC FINDINGS:  ? N/A ? ?PATIENT SURVEYS:  ?FOTO 47% function; 55% predicted ? ?COGNITION: ? Overall cognitive status: Within functional limits for tasks assessed   ?  ?SENSATION: ?Decreased light touch to R lateral LE ? ?POSTURE:  ?Medium body habitus, well healed scar to R knee ? ?PALPATION: ?TTP to medial R knee joint line ? ?LE ROM: ? ?Active ROM Right ?03/14/2022 Left ?03/14/2022  ?Hip flexion    ?Hip extension    ?Hip abduction    ?Hip adduction    ?Hip internal rotation    ?Hip external rotation    ?Knee flexion 110   ?Knee extension 0   ?Ankle dorsiflexion    ?Ankle plantarflexion    ?Ankle inversion    ?Ankle  eversion    ? (Blank rows = not tested) ? ?LE MMT: ? ?MMT Right ?03/14/2022 Left ?03/14/2022  ?Hip flexion 3+/5 4+/5  ?Hip extension    ?Hip abduction 3+/5 3+/5  ?Hip adduction    ?Hip internal rotation    ?Hip external rotation    ?Knee flexion 4/5 4+/5  ?Knee extension 4+/5 5/5  ?Ankle dorsiflexion    ?Ankle plantarflexion    ?Ankle inversion    ?Ankle eversion    ? (Blank rows = not tested) ? ?LOWER EXTREMITY SPECIAL TESTS:  ?N/A ? ?FUNCTIONAL TESTS:  ?30 Second Sit to Stand: 13 reps ? ?GAIT: ?Distance walked: 21f ?Assistive device utilized: None ?Level of assistance: Complete Independence ?Comments: R antalgic gait ? ?TODAY'S TREATMENT: ? ? ?  [facility] Adult PT Treatment:                                                DATE: 03/14/2022 ?Pt seen for aquatic therapy today.  Treatment took place in water 3.25-4.8 ft in depth at the [facility] pool. Temp of water was 91?.  Pt entered/exited the pool via stairs (step through pattern) independently with bilat rail. ? ?Walking ue suppoted by noodle forward and back, sidestepping ? ?Seated ? -flutter kicking 3x25 ? -add/abd 3x25 ? ?Standing ? -gastroc stretch bottom step 3x20s hold R/L ? -runners stretch hamstring 3x20s R/L ? -quad stretch 3x20s foot on 2nd step. ?with ankle buoys ?           -LAQ with blue noodle supporting knee and ankle 2x15  ? -noodle kick down rle hip neutral then in external hip rotation x 12 ? -ue supported by 2 foam hand buoys: toe raises, heel raises, add/abd, hip flex through ext, knee flex ?  ? Supine on noodle with nek and ankle buoys: ?-Knee flex/ext x10 ?           -hip extension x10 R/L ?Viscosity of the water is needed for resistance of strengthening; water current perturbations provides challenge to standing balance unsupported, requiring increased core activation. ? ? ?DATE: 03/06/2022 ?Pt seen for aquatic therapy today.  Treatment took place in water 3.25-4.8 ft in depth at the [facility] pool. Temp of water was 91?.  Pt  entered/exited the pool via stairs (step through pattern) independently with bilat rail. ? ?Walking ue suppoted by noodle forward and back, sidestepping ? ?Standing ? -gastroc stretch bottom step 3x20s hold R/L

## 2022-03-15 ENCOUNTER — Ambulatory Visit: Payer: BC Managed Care – PPO

## 2022-03-15 NOTE — Therapy (Incomplete)
?OUTPATIENT PHYSICAL THERAPY LOWER EXTREMITY EVALUATION ? ? ?Patient Name: Miranda [name] ?MRN: [id number] ?DOB:1976/04/05, 46 y.o., female ?Today's Date: 03/15/2022 ? ? ? ? ? ? ?Past Medical History:  ?Diagnosis Date  ? Anginal pain ([place])   ? Anxiety   ? Arthritis   ? Asthma   ? Bowel obstruction ([place])   ? Cardiac arrhythmia   ? Depression   ? sees Dr. Matilde Haymaker in [place]   ? Diabetes mellitus   ? sees Dr. Larna Daughters [place]verneni   ? Dyspnea   ? Dysrhythmia   ? GERD (gastroesophageal reflux disease)   ? Hyperlipidemia   ? Hypertension   ? Hypothyroidism   ? sees Dr. Elyse Hsu  ? Insomnia   ? Migraine syndrome   ? sees Dr. Teod[facility] Medici at [facility]   ? Morbid obesity [facility])   ? 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 Neck pain   ? Neuropathy associated with endocrine disorder ([place])   ? Sleep apnea with use of continuous positive airway pressure (CPAP)   ? Sleep apnea is resolved due to weight loss. 08/2017  ? Stroke ([place]) 03/25/2016  ? left MCA   ? Stroke [facility]) 04/2016  ? sees [facility] Teod[facility] Medici at [facility]   ? ?Past Surgical History:  ?Procedure Laterality Date  ? blocked itestinal repair  38  ? age 66 months  ? BREAST BIOPSY Left 2017  ? CHOLECYSTECTOMY  2011  ? INDUCED ABORTION  1996  ? forced abortion  ? INGUINAL HERNIA REPAIR Left 1980  ? age 1  ? KNEE CLOSED REDUCTION Right 08/22/2021  ? Procedure: RIGHT  KNEE MANIPULATION UNDER ANESTHESIA;  Surgeon: Meredith Pel, MD;  Location: [place];  Service: Orthopedics;  Laterality: Right;  ? [place]  ? TOTAL KNEE ARTHROPLASTY Right 07/05/2021  ? Procedure: TOTAL KNEE ARTHROPLASTY;  Surgeon: Meredith Pel, MD;  Location: [place];  Service: Orthopedics;  Laterality: Right;  ? [place] EXTRACTION  2007  ? ?Patient Active Problem List  ? Diagnosis Date Noted  ? Arthrofibrosis of knee joint, right   ? Arthritis of right knee   ? Diabetic gastroparesis ([place]) 07/20/2021  ? Constipation 07/20/2021  ? S/P total knee  arthroplasty, right 07/05/2021  ? Chronic diarrhea 04/29/2021  ? Belching 04/29/2021  ? Primary osteoarthritis of both knees 10/28/2020  ? Irritable bowel syndrome with diarrhea 10/16/2019  ? Tendinitis of right rotator cuff 09/17/2019  ? Hand pain, right 05/21/2019  ? Labral tear of hip joint 05/21/2019  ? Type 2 diabetes mellitus with diabetic neuropathy, unspecified ([place]) 10/01/2018  ? Right hip pain 05/22/2018  ? De Quervain's disease (radial styloid tenosynovitis) 04/02/2018  ? Carpal tunnel syndrome, right upper limb 12/31/2017  ? Elbow injury, right, initial encounter 12/16/2017  ? Chronic pain of both shoulders 11/30/2017  ? PTSD (post-traumatic stress disorder) 10/25/2016  ? 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 Diabetes mellitus ([place]) 10/25/2016  ? Major depressive disorder, recurrent episode, severe, with psychosis ([place]) 10/20/2016  ? Major depressive disorder, recurrent episode, severe, 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[place]) 10/19/2016  ? TIA (transient ischemic attack) 05/16/2016  ? Arterial ischemic stroke, MCA, left, acute ([place]) 03/29/2016  ? Plantar fasciitis 08/08/2013  ? Obesity, Class III, BMI 40-49.9 (morbid obesity) ([place]) 04/09/2013  ? Unspecified sleep apnea 03/31/2013  ? PCO (polycystic ovaries) 04/11/2012  ? Neck pain 11/21/2010  ? FATTY LIVER DISEASE 01/26/2010  ? NAUSEA WITH VOMITING 01/26/2010  ? PORTAL HYPERTENSION 01/26/2010  ?  Migraine headache 06/28/2009  ? BREAST MASS 08/21/2008  ? KNEE PAIN 04/14/2008  ? Hypothyroidism 03/17/2008  ? Hyperlipidemia 03/17/2008  ? DEPRESSION 03/17/2008  ? NEUROPATHY 03/17/2008  ? Essential hypertension 03/17/2008  ? ASTHMA 03/17/2008  ? GERD 03/17/2008  ? Headache 03/17/2008  ? ? ?REFERRING PROVIDER:  ?Vonita Moss, NP ? ?REFERRING DIAG:  ?pain due to total knee replacement ? ?THERAPY DIAG:  ?No diagnosis found. ? ?ONSET DATE: Chronic ? ?SUBJECTIVE:  ? ?SUBJECTIVE STATEMENT: ?*** "busy weekend I feel ok.  Hurt after last on land session, used ice  pack" ? ? ?PERTINENT HISTORY: ?R TKR- 07/05/2021, depression, PTSD,migraine/ TIA (loss of sensation on L side), L ischemic stroke, T2DM, irregular heartbeat, gastroparesis ? ?PAIN:  ?Are you having pain?  ?Yes: NPRS scale: ***/10 (7/10) ?Pain location: R knee ?Pain description: sharp,  ?Aggravating factors: stairs, knee flexion ?Relieving factors: rest ? ?PRECAUTIONS: None ? ?WEIGHT BEARING RESTRICTIONS No ? ?FALLS:  ?Has patient fallen in last 6 months? Yes. Number of falls one - fall while going down stairs due to ankle twisting ? ?LIVING ENVIRONMENT: ?Lives with: lives with their family ?Lives in: House/apartment ?Stairs: Yes: External: 5 steps; bilateral but cannot reach both ?Has following equipment at home: Single point cane and Walker - 2 wheeled ? 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 ?PLOF: Independent and Independent with basic ADLs ? ?PATIENT GOALS: decrease pain in R knee in order to improve walking and general mobility  ? ? ?OBJECTIVE:  ? ?DIAGNOSTIC FINDINGS:  ? N/A ? ?PATIENT SURVEYS:  ?FOTO 47% function; 55% predicted ? ?COGNITION: ? Overall cognitive status: Within functional limits for tasks assessed   ?  ?SENSATION: ?Decreased light touch to R lateral LE ? ?POSTURE:  ?Medium body habitus, well healed scar to R knee ? ?PALPATION: ?TTP to medial R knee joint line ? ?LE ROM: ? ?Active ROM Right ?03/15/2022 Left ?03/15/2022  ?Hip flexion    ?Hip extension    ?Hip abduction    ?Hip adduction    ?Hip internal rotation    ?Hip external rotation    ?Knee flexion 110   ?Knee extension 0   ?Ankle dorsiflexion    ?Ankle plantarflexion    ?Ankle inversion    ?Ankle eversion    ? (Blank rows = not tested) ? ?LE MMT: ? ?MMT Right ?03/15/2022 Left ?03/15/2022  ?Hip flexion 3+/5 4+/5  ?Hip extension    ?Hip abduction 3+/5 3+/5  ?Hip adduction    ?Hip internal rotation    ?Hip external rotation    ?Knee flexion 4/5 4+/5  ?Knee extension 4+/5 5/5  ?Ankle dorsiflexion    ?Ankle plantarflexion    ?Ankle inversion    ?Ankle  eversion    ? (Blank rows = not tested) ? ?LOWER EXTREMITY SPECIAL TESTS:  ?N/A ? ?FUNCTIONAL TESTS:  ?30 Second Sit to Stand: 13 reps ? ?GAIT: ?Distance walked: 66f ?Assistive device utilized: None ?Level of assistance: Complete Independence ?Comments: R antalgic gait ? ?[facility]                                                [facility]: [facility] ?Therapeutic Exercise: ?NuStep lvl 5 UE/LE x 3 min while taking subjective  ?Standing mini squat 2x10  ?LAQ 2x10 4# ?STS holding 10# KB ?Supine SLR 2x15 each ?S/L clamshell 2x10 GTB ?Standing hip abd  2x10 25# ?Eccentric step down R 2x10 2in ?Modalities: ?Vasopneumatic  ?Gameready  ?10 min ?Low compression ?38 deg ? ? ? ?[facility] Adult PT Treatment:                                                DATE: 03/14/2022 ?Pt seen for aquatic therapy today.  Treatment took place in water 3.25-4.8 ft in depth at the [facility] pool. Temp of water was 91?.  Pt entered/exited the pool via stairs (step through pattern) independently with bilat rail. ? ?Walking ue suppoted by noodle forward and back, sidestepping ? ?Seated ? -flutter kicking 3x25 ? -add/abd 3x25 ? ?Standing ? -gastroc stretch bottom step 3x20s hold R/L ? -runners stretch hamstring 3x20s R/L ? -quad stretch 3x20s foot on 2nd step. ?with ankle buoys ?           -LAQ with blue noodle supporting knee and ankle 2x15  ? -noodle kick down rle hip neutral then in external hip rotation x 12 ? -ue supported by 2 foam hand buoys: toe raises, heel raises, add/abd, hip flex through ext, knee flex ?  ? Supine on noodle with nek and ankle buoys: ?-Knee flex/ext x10 ?           -hip extension x10 R/L ?Viscosity of the water is needed for resistance of strengthening; water current perturbations provides challenge to standing balance unsupported, requiring increased core activation. ? ? ?DATE: 03/06/2022 ?Pt seen for aquatic therapy today.  Treatment took place in water 3.25-4.8 ft in depth at the  [facility] pool. Temp of water was 91?.  Pt entered/exited the pool via stairs (step through pattern) independently with bilat rail. ? ?Walking ue suppoted by noodle forward and back, sidestepping ?

## 2022-03-17 ENCOUNTER — Ambulatory Visit (HOSPITAL_BASED_OUTPATIENT_CLINIC_OR_DEPARTMENT_OTHER): Payer: BC Managed Care – PPO | Admitting: Physical Therapy

## 2022-03-17 ENCOUNTER — Encounter (HOSPITAL_BASED_OUTPATIENT_CLINIC_OR_DEPARTMENT_OTHER): Payer: Self-pay | Admitting: Physical Therapy

## 2022-03-17 DIAGNOSIS — M25561 Pain in right knee: Secondary | ICD-10-CM | POA: Diagnosis not present

## 2022-03-17 DIAGNOSIS — M6281 Muscle weakness (generalized): Secondary | ICD-10-CM

## 2022-03-17 DIAGNOSIS — R2689 Other abnormalities of gait and mobility: Secondary | ICD-10-CM

## 2022-03-17 DIAGNOSIS — G8929 Other chronic pain: Secondary | ICD-10-CM

## 2022-03-17 NOTE — Therapy (Addendum)
OUTPATIENT PHYSICAL THERAPY LOWER EXTREMITY TREATMENT   Patient Name: Miranda [name] MRN: [id number] DOB:[birth year], 46 y.o., female Today's Date: 03/17/2022   PT End of Session - 03/17/22 1316     Visit Number 7    Number of Visits 17    Date for PT Re-Evaluation 04/21/22    Authorization Type BCBS    PT Start Time 1308   pt running late   PT Stop Time 1346    PT Time Calculation (min) 38 min               Past Medical History:  Diagnosis Date   Anginal pain ([place])    Anxiety    Arthritis    Asthma    Bowel obstruction ([place])    Cardiac arrhythmia    Depression    sees Dr. Matilde Haymaker in [place]    Diabetes mellitus    sees Dr. Madelin Rear    Dyspnea    Dysrhythmia    GERD (gastroesophageal reflux disease)    Hyperlipidemia    Hypertension    Hypothyroidism    sees Dr. Elyse Hsu   Insomnia    Migraine syndrome    sees Dr. 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 08/2017   Stroke ([place]) 03/25/2016   left MCA    Stroke [facility]) 04/2016   sees Dr. Teodora Medici at [facility]    Past Surgical History:  Procedure Laterality Date   blocked itestinal repair  1975   age 68 months   BREAST BIOPSY Left 2017   CHOLECYSTECTOMY  2011   INDUCED ABORTION  1996   forced abortion   INGUINAL HERNIA REPAIR Left 1980   age 6   KNEE CLOSED REDUCTION Right 08/22/2021   Procedure: RIGHT  KNEE MANIPULATION UNDER ANESTHESIA;  Surgeon: Meredith Pel, MD;  Location: [place];  Service: Orthopedics;  Laterality: Right;   LAPAROSCOPIC ENDOMETRIOSIS FULGURATION  1998   TOTAL KNEE ARTHROPLASTY Right 07/05/2021   Procedure: TOTAL KNEE ARTHROPLASTY;  Surgeon: Meredith Pel, MD;  Location: [place];  Service: Orthopedics;   Laterality: Right;   WISDOM TOOTH EXTRACTION  2007   Patient Active Problem List   Diagnosis Date Noted   Arthrofibrosis of knee joint, right    Arthritis of right knee    Diabetic gastroparesis ([place]) 07/20/2021   Constipation 07/20/2021   S/P total knee arthroplasty, right 07/05/2021   Chronic diarrhea 04/29/2021   Belching 04/29/2021   Primary osteoarthritis of both knees 10/28/2020   Irritable bowel syndrome with diarrhea 10/16/2019   Tendinitis of right rotator cuff 09/17/2019   Hand pain, right 05/21/2019   Labral tear of hip joint 05/21/2019   Type 2 diabetes mellitus with diabetic neuropathy, unspecified ([place]) 10/01/2018   Right hip pain 05/22/2018   Tennis Must Quervain's disease (radial styloid tenosynovitis) 04/02/2018   Carpal tunnel syndrome, right upper limb 12/31/2017   Elbow injury, right, initial encounter 12/16/2017   Chronic pain of both shoulders 11/30/2017   PTSD (post-traumatic stress disorder) 10/25/2016   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[place]) 10/25/2016   Diabetes mellitus ([place]) 10/25/2016   Major depressive disorder, recurrent episode, severe, with psychosis ([place]) 10/20/2016   Major depressive disorder, recurrent episode, severe,  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[place]) 10/19/2016   TIA (transient ischemic attack) 05/16/2016   Arterial ischemic stroke, MCA, left, acute ([place]) 03/29/2016   Plantar fasciitis 08/08/2013   Obesity, Class III, BMI 40-49.9 (morbid obesity) ([place]) 04/09/2013   Unspecified sleep apnea 03/31/2013   PCO (polycystic ovaries) 04/11/2012   Neck pain 11/21/2010   FATTY LIVER DISEASE 01/26/2010   NAUSEA WITH VOMITING 01/26/2010   PORTAL HYPERTENSION 01/26/2010   Migraine headache 06/28/2009   BREAST MASS 08/21/2008   KNEE PAIN 04/14/2008   Hypothyroidism 03/17/2008   Hyperlipidemia 03/17/2008   DEPRESSION 03/17/2008   NEUROPATHY 03/17/2008   Essential hypertension 03/17/2008   ASTHMA 03/17/2008   GERD 03/17/2008   Headache 03/17/2008    REFERRING  PROVIDER:  Vonita Moss, NP  REFERRING DIAG:  pain due to total knee replacement  THERAPY DIAG:  Muscle weakness (generalized)  Other abnormalities of gait and mobility  Chronic pain of right knee  ONSET DATE: Chronic  SUBJECTIVE:   SUBJECTIVE STATEMENT: Pt reports that she has been icing her Rt knee a couple times a week.  "I can tell the difference when I don't ice".  She reports she had relief from the last aquatic session, mild muscle soreness that night. She reports she has been sleeping longer in the bed.    PERTINENT HISTORY: R TKR- 07/05/2021, depression, PTSD,migraine/ TIA (loss of sensation on L side), L ischemic stroke, T2DM, irregular heartbeat, gastroparesis  PAIN:  Are you having pain? Yes Yes: NPRS scale: 4-5/10 Pain location: R knee Pain description: sharp,  Aggravating factors: stairs, knee flexion Relieving factors: rest  PRECAUTIONS: None  WEIGHT BEARING RESTRICTIONS No  FALLS:  Has patient fallen in last 6 months? Yes. 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  PLOF: Independent and Independent with basic ADLs  PATIENT GOALS: decrease pain in R knee in order to improve walking and general mobility    OBJECTIVE:   DIAGNOSTIC FINDINGS:   N/A  PATIENT SURVEYS:  FOTO 47% function; 55% predicted  COGNITION:  Overall cognitive status: Within functional limits for tasks assessed     SENSATION: Decreased light touch to R lateral LE  POSTURE:  Medium body habitus, well healed scar to R knee  PALPATION: TTP to medial R knee joint line  LE ROM:  Active ROM Right 03/17/2022 Left 03/17/2022  Hip flexion    Hip extension    Hip abduction    Hip  adduction    Hip internal rotation    Hip external rotation    Knee flexion 110   Knee extension 0   Ankle dorsiflexion    Ankle plantarflexion    Ankle inversion    Ankle eversion     (Blank rows = not tested)  LE MMT:  MMT Right 03/17/2022 Left 03/17/2022  Hip flexion 3+/5 4+/5  Hip extension    Hip abduction 3+/5 3+/5  Hip adduction    Hip internal rotation    Hip external rotation    Knee flexion 4/5 4+/5  Knee extension 4+/5 5/5  Ankle dorsiflexion    Ankle plantarflexion    Ankle inversion    Ankle eversion     (Blank rows = not tested)  LOWER EXTREMITY SPECIAL TESTS:  N/A  FUNCTIONAL TESTS:  30 Second Sit to Stand: 13 reps  GAIT: Distance walked: 74f Assistive device utilized: None Level of assistance: Complete Independence Comments: R antalgic gait  TODAY'S TREATMENT:   [facility] Adult PT Treatment:                                                DATE: 03/17/2022 Pt seen for aquatic therapy today.  Treatment took place in water 3.25-4.8 ft [facility] Temp of water was 91.  Pt entered/exited the pool via stairs (step through pattern) independently with bilat rail.  Walking without UE support forward and back, sidestepping RLE Forward step up without support  x 10;  R/L forward step up on 2nd step, eccentric lowering down backwards x 5 each Holding yellow noodle at 429f  3 way (straight)leg kick x 8 each LE Sitting on yellow noodle:  bicycle, CC ski, hip abdct / add -x 2 sets (bicycle with varied speed) Sitting on bench with blue step under feet: STS without support x 10 Standing holding blue square noodle:  Heel toe raises; side lunge and return to neutral R/L;  curtsty lunges  Holding wall:  quad stretch 2x 20 sec with foot supported by blue square noodle, followed by forward lean/hip ext for standing leg hamstring stretch x 20 sec x 2   Viscosity of the water is needed for resistance of strengthening; water current  perturbations provides challenge to standing balance unsupported, requiring increased core activation.  PATIENT EDUCATION:  Education details: progression of exercise; DOMS expectation  Person educated: Patient Education method: Explanation, Demonstration, and Handouts Education comprehension: verbalized understanding and returned demonstration   HOME EXERCISE PROGRAM: Access Code: [email or web address]: [email or web address] Date: 02/23/2022 Prepared by: DaOctavio MannsExercises - Active Straight Leg Raise with Quad Set  - 1 x daily - 7 x weekly - 3 sets - 10 reps - Hooklying Clamshell with Resistance  - 1 x daily - 7 x weekly - 3 sets - 15 reps - Standing Terminal Knee Extension at Wall with Ball  - 1 x daily - 7 x weekly - 3 sets - 10 reps - 5 sec hold  ASSESSMENT:  CLINICAL IMPRESSION: Pt initially complaining of increased stiffness in Rt knee; eased during session.  Good tolerance to exercises in water without increase in pain. Able to complete exercises / gait without UE support.   Goals ongoing   Patient is a [age].o. F who was seen today for physical therapy evaluation and treatment for R knee pain. Physical findings are consistent with referring provider impression, as pt demonstrates decreased LE strength and pain during functional mobility. Her FOTO score indicates she is operating below PLOF and would benefit from skilled PT services in order to improve hip and quad strength in order to decrease pain and improve function. PT will alternate gym and aquatic sessions for improving strength in reduced joint load environment.    OBJECTIVE IMPAIRMENTS decreased activity tolerance, decreased endurance, decreased mobility, difficulty walking, decreased ROM, decreased strength, and pain.   ACTIVITY LIMITATIONS cleaning, community activity, occupation, and yard work.   PERSONAL FACTORS Fitness, Past/current experiences, and 3+ comorbidities: R TKR- 07/05/2021, depression,  PTSD,migraine/ TIA (loss of sensation on L side), L ischemic stroke, T2DM, irregular heartbeat, gastroparesis  are also affecting patient's functional outcome.  REHAB POTENTIAL: Good  CLINICAL DECISION MAKING: Stable/uncomplicated  EVALUATION COMPLEXITY: Low   GOALS: Goals reviewed with patient? No  SHORT TERM GOALS: Target date: 04/07/2022  Pt will be compliant and knowledgeable with initial HEP for improved comfort and carryover Baseline: initial HEP given  Goal status: INITIAL  2.  Pt will self report R knee pain no greater than 5/10 for improved comfort and functional ability Baseline: 7/10 at worst Goal status: INITIAL  LONG TERM GOALS: Target date: 05/12/2022  Pt will self report R knee pain no greater than 2/10 for improved comfort and functional ability Baseline: 7/10 at worst Goal status: INITIAL  2.  Pt will improve FOTO function score to no less than 55% as proxy for functional improvement Baseline: 47% function Goal status: INITIAL  3.  Pt will be able to ambulate up/down 15 stairs with reciprocal gait and no increase in knee pain for improved comfort and functional mobility Baseline: unable  Goal status: INITIAL  4.  Pt will improve all R LE strength to no less than 4+/5 for improved mobility and decreased pain Baseline: see chart Goal status: INITIAL  PLAN: PT FREQUENCY: 1-2x/week  PT DURATION: 8 weeks  PLANNED INTERVENTIONS: Therapeutic exercises, Therapeutic activity, Neuromuscular re-education, Balance training, Gait training, Patient/Family education, Joint mobilization, Aquatic Therapy, Dry Needling, Electrical stimulation, and Manual therapy  PLAN FOR NEXT SESSION: assess HEP response, LE strengthening  Kerin Perna, PTA 03/17/22 1:59 PM

## 2022-03-22 ENCOUNTER — Ambulatory Visit: Payer: BC Managed Care – PPO | Admitting: Physical Therapy

## 2022-03-24 ENCOUNTER — Ambulatory Visit (HOSPITAL_BASED_OUTPATIENT_CLINIC_OR_DEPARTMENT_OTHER): Payer: BC Managed Care – PPO | Admitting: Physical Therapy

## 2022-03-24 ENCOUNTER — Encounter (HOSPITAL_BASED_OUTPATIENT_CLINIC_OR_DEPARTMENT_OTHER): Payer: Self-pay | Admitting: Physical Therapy

## 2022-03-24 DIAGNOSIS — G8929 Other chronic pain: Secondary | ICD-10-CM

## 2022-03-24 DIAGNOSIS — M6281 Muscle weakness (generalized): Secondary | ICD-10-CM

## 2022-03-24 DIAGNOSIS — M25561 Pain in right knee: Secondary | ICD-10-CM | POA: Diagnosis not present

## 2022-03-24 DIAGNOSIS — R2689 Other abnormalities of gait and mobility: Secondary | ICD-10-CM

## 2022-03-24 NOTE — Therapy (Signed)
OUTPATIENT PHYSICAL THERAPY LOWER EXTREMITY TREATMENT   Patient Name: Miranda [name] MRN: [id number] DOB:13-Apr-1976, 46 y.o., female Today's Date: 03/24/2022   PT End of Session - 03/24/22 0819     Visit Number 8    Number of Visits 17    Date for PT Re-Evaluation 04/21/22    Authorization Type BCBS    PT Start Time 0815    PT Stop Time 0900    PT Time Calculation (min) 45 min    Activity Tolerance Patient tolerated treatment well;Patient limited by pain    Behavior During Therapy [facility] for tasks assessed/performed               Past Medical History:  Diagnosis Date   Anginal pain ([place])    Anxiety    Arthritis    Asthma    Bowel obstruction ([place])    Cardiac arrhythmia    Depression    sees Dr. Matilde Haymaker in [place]    Diabetes mellitus    sees Dr. Madelin Rear    Dyspnea    Dysrhythmia    GERD (gastroesophageal reflux disease)    Hyperlipidemia    Hypertension    Hypothyroidism    sees Dr. Elyse Hsu   Insomnia    Migraine syndrome    se[facility] Dr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 08/2017   Stroke ([place]) 03/25/2016   left MCA    Stroke [facility]) 04/2016   sees Dr. Teodora Medici at [facility]    Past Surgical History:  Procedure Laterality Date   blocked itestinal repair  1975   age 36 months   BREAST BIOPSY Left 2017   CHOLECYSTECTOMY  2011   INDUCED ABORTION  1996   forced abortion   INGUINAL HERNIA REPAIR Left 1980   age 32   KNEE CLOSED REDUCTION Right 08/22/2021   Procedure: RIGHT  KNEE MANIPULATION UNDER ANESTHESIA;  Surgeon: Meredith Pel, MD;  Location: [place];  Service: Orthopedics;  Laterality: Right;   LAPAROSCOPIC ENDOMETRIOSIS FULGURATION  1998   TOTAL KNEE ARTHROPLASTY Right 07/05/2021    Procedure: TOTAL KNEE ARTHROPLASTY;  Surgeon: Meredith Pel, MD;  Location: [place];  Service: Orthopedics;  Laterality: Right;   WISDOM TOOTH EXTRACTION  2007   Patient Active Problem List   Diagnosis Date Noted   Arthrofibrosis of knee joint, right    Arthritis of right knee    Diabetic gastroparesis ([place]) 07/20/2021   Constipation 07/20/2021   S/P total knee arthroplasty, right 07/05/2021   Chronic diarrhea 04/29/2021   Belching 04/29/2021   Primary osteoarthritis of both knees 10/28/2020   Irritable bowel syndrome with diarrhea 10/16/2019   Tendinitis of right rotator cuff 09/17/2019   Hand pain, right 05/21/2019   Labral tear of hip joint 05/21/2019   Type 2 diabetes mellitus with diabetic neuropathy, unspecified ([place]) 10/01/2018   Right hip pain 05/22/2018   Tennis Must Quervain's disease (radial styloid tenosynovitis) 04/02/2018   Carpal tunnel syndrome, right upper limb 12/31/2017   Elbow injury, right, initial encounter 12/16/2017   Chronic pain of both shoulders 11/30/2017   PTSD (post-traumatic stress disorder) 10/25/2016   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[place]) 10/25/2016   Diabetes mellitus ([place]) 10/25/2016  Major depressive disorder, recurrent episode, severe, with psychosis ([place]) 10/20/2016   Major depressive disorder, recurrent episode, severe, 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[place]) 10/19/2016   TIA (transient ischemic attack) 05/16/2016   Arterial ischemic stroke, MCA, left, acute ([place]) 03/29/2016   Plantar fasciitis 08/08/2013   Obesity, Class III, BMI 40-49.9 (morbid obesity) ([place]) 04/09/2013   Unspecified sleep apnea 03/31/2013   PCO (polycystic ovaries) 04/11/2012   Neck pain 11/21/2010   FATTY LIVER DISEASE 01/26/2010   NAUSEA WITH VOMITING 01/26/2010   PORTAL HYPERTENSION 01/26/2010   Migraine headache 06/28/2009   BREAST MASS 08/21/2008   KNEE PAIN 04/14/2008   Hypothyroidism 03/17/2008   Hyperlipidemia 03/17/2008   DEPRESSION 03/17/2008   NEUROPATHY 03/17/2008    Essential hypertension 03/17/2008   ASTHMA 03/17/2008   GERD 03/17/2008   Headache 03/17/2008    REFERRING PROVIDER:  Vonita Moss, NP  REFERRING DIAG:  pain due to total knee replacement  THERAPY DIAG:  Muscle weakness (generalized)  Other abnormalities of gait and mobility  Chronic pain of right knee  Chronic pain of left knee  ONSET DATE: Chronic  SUBJECTIVE:   SUBJECTIVE STATEMENT: "Went camping knees are really hurting but I enjoyed myself. I have been icing and taking ibuprofen."    PERTINENT HISTORY: R TKR- 07/05/2021, depression, PTSD,migraine/ TIA (loss of sensation on L side), L ischemic stroke, T2DM, irregular heartbeat, gastroparesis  PAIN:  Are you having pain? Yes Yes: NPRS scale: 6/10 Pain location: R knee Pain description: sharp,  Aggravating factors: stairs, knee flexion Relieving factors: rest  PRECAUTIONS: None  WEIGHT BEARING RESTRICTIONS No  FALLS:  Has patient fallen in last 6 months? Yes. 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  PLOF: Independent and Independent with basic ADLs  PATIENT GOALS: decrease pain in R knee in order to improve walking and general mobility    OBJECTIVE:   DIAGNOSTIC FINDINGS:   N/A  PATIENT SURVEYS:  FOTO 47% function; 55% predicted  COGNITION:  Overall cognitive status: Within functional limits for tasks assessed     SENSATION: Decreased light touch to R lateral LE  POSTURE:  Medium body habitus, well healed scar to R knee  PALPATION: TTP to medial R knee joint line  LE ROM:  Active ROM Right 03/24/2022 Left 03/24/2022  Hip flexion    Hip extension    Hip abduction    Hip adduction    Hip  internal rotation    Hip external rotation    Knee flexion 110   Knee extension 0   Ankle dorsiflexion    Ankle plantarflexion    Ankle inversion    Ankle eversion     (Blank rows = not tested)  LE MMT:  MMT Right 03/24/2022 Left 03/24/2022  Hip flexion 3+/5 4+/5  Hip extension    Hip abduction 3+/5 3+/5  Hip adduction    Hip internal rotation    Hip external rotation    Knee flexion 4/5 4+/5  Knee extension 4+/5 5/5  Ankle dorsiflexion    Ankle plantarflexion    Ankle inversion    Ankle eversion     (Blank rows = not tested)  LOWER EXTREMITY SPECIAL TESTS:  N/A  FUNCTIONAL TESTS:  30 Second Sit to Stand:  13 reps  GAIT: Distance walked: 59f Assistive device utilized: None Level of assistance: Complete Independence Comments: R antalgic gait  TODAY'S TREATMENT:   [facility] Adult PT Treatment:                                                DATE: 03/24/2022 Pt seen for aquatic therapy today.  Treatment took place in water 3.25-4.8 ft in [facility] Temp of water was 91.  Pt entered/exited the pool via stairs (step through pattern) independently with bilat rail.  Straddling noodle cycling; skiing Seated stretching hamstring, gastroc and adductors -flutter kicking; add/abd 3x20; kicking at knee 3x20 cues for speed at tolerance 4 ft forward, back and side stepping 2 widths each ue support on noodle to decreased le load Standing holding to wall: tr; hr; add/abd; hip extension; flex x15 Back onto noodle cycling and add/abd  Viscosity of the water is needed for resistance of strengthening; water current perturbations provides challenge to standing balance unsupported, requiring increased core activation.  PATIENT EDUCATION:  Education details: progression of exercise; DOMS expectation  Person educated: Patient Education method: Explanation, Demonstration, and Handouts Education comprehension: verbalized understanding and returned  demonstration   HOME EXERCISE PROGRAM: Access Code: [email or web address]: [email or web address] Date: 02/23/2022 Prepared by: DOctavio Manns Exercises - Active Straight Leg Raise with Quad Set  - 1 x daily - 7 x weekly - 3 sets - 10 reps - Hooklying Clamshell with Resistance  - 1 x daily - 7 x weekly - 3 sets - 15 reps - Standing Terminal Knee Extension at Wall with Ball  - 1 x daily - 7 x weekly - 3 sets - 10 reps - 5 sec hold  ASSESSMENT:  CLINICAL IMPRESSION: Pt went on planned camping trip having increased knee pain upon return (As she expected). Pt education on management of condition, altering activities as needed using OTC meds and ice.  Focused on ROM/opening joint spaces with gentle movement and strengthening ex unloaded as able, seated on noodle and in 4 ft. Decreased toleration to activity limited to pain today although reported decrease in overall discomfort upon completion.    Patient is a 46y.o. F who was seen today for physical therapy evaluation and treatment for R knee pain. Physical findings are consistent with referring provider impression, as pt demonstrates decreased LE strength and pain during functional mobility. Her FOTO score indicates she is operating below PLOF and would benefit from skilled PT services in order to improve hip and quad strength in order to decrease pain and improve function. PT will alternate gym and aquatic sessions for improving strength in reduced joint load environment.    OBJECTIVE IMPAIRMENTS decreased activity tolerance, decreased endurance, decreased mobility, difficulty walking, decreased ROM, decreased strength, and pain.   ACTIVITY LIMITATIONS cleaning, community activity, occupation, and yard work.   PERSONAL FACTORS Fitness, Past/current experiences, and 3+ comorbidities: R TKR- 07/05/2021, depression, PTSD,migraine/ TIA (loss of sensation on L side), L ischemic stroke, T2DM, irregular heartbeat, gastroparesis  are also  affecting patient's functional outcome.    REHAB POTENTIAL: Good  CLINICAL DECISION MAKING: Stable/uncomplicated  EVALUATION COMPLEXITY: Low   GOALS: Goals reviewed with patient? No  SHORT TERM GOALS: Target date: 04/14/2022  Pt will be compliant and knowledgeable with initial HEP for improved comfort and carryover Baseline: initial HEP given  Goal  status: INITIAL  2.  Pt will self report R knee pain no greater than 5/10 for improved comfort and functional ability Baseline: 7/10 at worst Goal status: INITIAL  LONG TERM GOALS: Target date: 05/19/2022  Pt will self report R knee pain no greater than 2/10 for improved comfort and functional ability Baseline: 7/10 at worst Goal status: INITIAL  2.  Pt will improve FOTO function score to no less than 55% as proxy for functional improvement Baseline: 47% function Goal status: INITIAL  3.  Pt will be able to ambulate up/down 15 stairs with reciprocal gait and no increase in knee pain for improved comfort and functional mobility Baseline: unable  Goal status: INITIAL  4.  Pt will improve all R LE strength to no less than 4+/5 for improved mobility and decreased pain Baseline: see chart Goal status: INITIAL  PLAN: PT FREQUENCY: 1-2x/week  PT DURATION: 8 weeks  PLANNED INTERVENTIONS: Therapeutic exercises, Therapeutic activity, Neuromuscular re-education, Balance training, Gait training, Patient/Family education, Joint mobilization, Aquatic Therapy, Dry Needling, Electrical stimulation, and Manual therapy  PLAN FOR NEXT SESSION: assess HEP response, LE strengthening  Blu Mcglaun (Frankie) Arrielle Mcginn MPT 03/24/22 8:21 AM

## 2022-03-28 ENCOUNTER — Other Ambulatory Visit: Payer: Self-pay | Admitting: Obstetrics and Gynecology

## 2022-03-28 DIAGNOSIS — Z1231 Encounter for screening mammogram for malignant neoplasm of breast: Secondary | ICD-10-CM

## 2022-03-29 ENCOUNTER — Ambulatory Visit (HOSPITAL_BASED_OUTPATIENT_CLINIC_OR_DEPARTMENT_OTHER): Payer: BC Managed Care – PPO | Admitting: Physical Therapy

## 2022-03-29 DIAGNOSIS — M25561 Pain in right knee: Secondary | ICD-10-CM | POA: Diagnosis not present

## 2022-03-29 DIAGNOSIS — G8929 Other chronic pain: Secondary | ICD-10-CM

## 2022-03-29 DIAGNOSIS — R2689 Other abnormalities of gait and mobility: Secondary | ICD-10-CM

## 2022-03-29 DIAGNOSIS — M6281 Muscle weakness (generalized): Secondary | ICD-10-CM

## 2022-03-29 NOTE — Therapy (Signed)
OUTPATIENT PHYSICAL THERAPY LOWER EXTREMITY TREATMENT   Patient Name: Miranda [name] MRN: [id number] DOB:Jul 11, 1976, 46 y.o., female Today's Date: 03/29/2022   PT End of Session - 03/29/22 0817     Visit Number 9    Number of Visits 17    Date for PT Re-Evaluation 04/21/22    Authorization Type BCBS    PT Start Time [phone]    PT Stop Time 0900    PT Time Calculation (min) 41 min    Activity Tolerance Patient tolerated treatment well;Patient limited by pain    Behavior During Therapy [facility] for tasks assessed/performed               Past Medical History:  Diagnosis Date   Anginal pain ([place])    Anxiety    Arthritis    Asthma    Bowel obstruction ([place])    Cardiac arrhythmia    Depression    sees Dr. Matilde Haymaker in [place]    Diabetes mellitus    sees Dr. Madelin Rear    Dyspnea    Dysrhythmia    GERD (gastroesophageal reflux disease)    Hyperlipidemia    Hypertension    Hypothyroidism    sees Dr. Elyse Hsu   Insomnia    Migraine syndrome    sees Dr. 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 08/2017   Stroke ([place]) 03/25/2016   left MCA    Stroke [facility]) 04/2016   sees Dr. Teodora Medici at [facility]    Past Surgical History:  Procedure Laterality Date   blocked itestinal repair  1975   age 80 months   BREAST BIOPSY Left 2017   CHOLECYSTECTOMY  2011   INDUCED ABORTION  1996   forced abortion   INGUINAL HERNIA REPAIR Left 1980   age 17   KNEE CLOSED REDUCTION Right 08/22/2021   Procedure: RIGHT  KNEE MANIPULATION UNDER ANESTHESIA;  Surgeon: Meredith Pel, MD;  Location: [place];  Service: Orthopedics;  Laterality: Right;   LAPAROSCOPIC ENDOMETRIOSIS FULGURATION  1998   TOTAL KNEE ARTHROPLASTY Right 07/05/2021    Procedure: TOTAL KNEE ARTHROPLASTY;  Surgeon: Meredith Pel, MD;  Location: [place];  Service: Orthopedics;  Laterality: Right;   WISDOM TOOTH EXTRACTION  2007   Patient Active Problem List   Diagnosis Date Noted   Arthrofibrosis of knee joint, right    Arthritis of right knee    Diabetic gastroparesis ([place]) 07/20/2021   Constipation 07/20/2021   S/P total knee arthroplasty, right 07/05/2021   Chronic diarrhea 04/29/2021   Belching 04/29/2021   Primary osteoarthritis of both knees 10/28/2020   Irritable bowel syndrome with diarrhea 10/16/2019   Tendinitis of right rotator cuff 09/17/2019   Hand pain, right 05/21/2019   Labral tear of hip joint 05/21/2019   Type 2 diabetes mellitus with diabetic neuropathy, unspecified ([place]) 10/01/2018   Right hip pain 05/22/2018   Tennis Must Quervain's disease (radial styloid tenosynovitis) 04/02/2018   Carpal tunnel syndrome, right upper limb 12/31/2017   Elbow injury, right, initial encounter 12/16/2017   Chronic pain of both shoulders 11/30/2017   PTSD (post-traumatic stress disorder) 10/25/2016   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[place]) 10/25/2016   Diabetes mellitus ([place]) 10/25/2016  Major depressive disorder, recurrent episode, severe, with psychosis ([place]) 10/20/2016   Major depressive disorder, recurrent episode, severe, 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[place]) 10/19/2016   TIA (transient ischemic attack) 05/16/2016   Arterial ischemic stroke, MCA, left, acute ([place]) 03/29/2016   Plantar fasciitis 08/08/2013   Obesity, Class III, BMI 40-49.9 (morbid obesity) ([place]) 04/09/2013   Unspecified sleep apnea 03/31/2013   PCO (polycystic ovaries) 04/11/2012   Neck pain 11/21/2010   FATTY LIVER DISEASE 01/26/2010   NAUSEA WITH VOMITING 01/26/2010   PORTAL HYPERTENSION 01/26/2010   Migraine headache 06/28/2009   BREAST MASS 08/21/2008   KNEE PAIN 04/14/2008   Hypothyroidism 03/17/2008   Hyperlipidemia 03/17/2008   DEPRESSION 03/17/2008   NEUROPATHY 03/17/2008    Essential hypertension 03/17/2008   ASTHMA 03/17/2008   GERD 03/17/2008   Headache 03/17/2008    REFERRING PROVIDER:  Vonita Moss, NP  REFERRING DIAG:  pain due to total knee replacement  THERAPY DIAG:  Muscle weakness (generalized)  Other abnormalities of gait and mobility  Chronic pain of right knee  Chronic pain of left knee  ONSET DATE: Chronic  SUBJECTIVE:   SUBJECTIVE STATEMENT: "Jerked my knee in my sleep last night and it really hurt, painful and stiff. Its been almost 1 year and I am still really hurting"    PERTINENT HISTORY: R TKR- 07/05/2021, depression, PTSD,migraine/ TIA (loss of sensation on L side), L ischemic stroke, T2DM, irregular heartbeat, gastroparesis  PAIN:  Are you having pain? Yes Yes: NPRS scale: 5/10 Pain location: R knee Pain description: sharp,  Aggravating factors: stairs, knee flexion Relieving factors: rest  PRECAUTIONS: None  WEIGHT BEARING RESTRICTIONS No  FALLS:  Has patient fallen in last 6 months? Yes. 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  PLOF: Independent and Independent with basic ADLs  PATIENT GOALS: decrease pain in R knee in order to improve walking and general mobility    OBJECTIVE:   DIAGNOSTIC FINDINGS:   N/A  PATIENT SURVEYS:  FOTO 47% function; 55% predicted  COGNITION:  Overall cognitive status: Within functional limits for tasks assessed     SENSATION: Decreased light touch to R lateral LE  POSTURE:  Medium body habitus, well healed scar to R knee  PALPATION: TTP to medial R knee joint line  LE ROM:  Active ROM Right 03/29/2022 Left 03/29/2022  Hip flexion    Hip extension    Hip abduction     Hip adduction    Hip internal rotation    Hip external rotation    Knee flexion 110   Knee extension 0   Ankle dorsiflexion    Ankle plantarflexion    Ankle inversion    Ankle eversion     (Blank rows = not tested)  LE MMT:  MMT Right 03/29/2022 Left 03/29/2022  Hip flexion 3+/5 4+/5  Hip extension    Hip abduction 3+/5 3+/5  Hip adduction    Hip internal rotation    Hip external rotation    Knee flexion 4/5 4+/5  Knee extension 4+/5 5/5  Ankle dorsiflexion    Ankle plantarflexion    Ankle inversion    Ankle eversion     (Blank rows = not tested)  LOWER EXTREMITY SPECIAL TESTS:  N/A  FUNCTIONAL TESTS:  30 Second Sit to Stand: 13 reps  GAIT: Distance walked: 5f Assistive device utilized: None Level of assistance: Complete Independence Comments: R antalgic gait  TODAY'S TREATMENT:   [facility] Adult PT Treatment:                                                DATE: 03/24/2022 Pt seen for aquatic therapy today.  Treatment took place in water 3.25-4.8 ft [facility] Temp of water was 91.  Pt entered/exited the pool via stairs step through pattern independently with bilat rail.  Walking forward, back and side stepping multiple widths without UE support  Straddling noodle cycling; skiing; add/abd Monster walk x 4 widths with ue supported by yellow hand buoys Hurdle walk x 4 widths yellow hand buoys  Viscosity of the water is needed for resistance of strengthening; water current perturbations provides challenge to standing balance unsupported, requiring increased core activation.  Manual: IASTM/ STM to Rt lateral distal quad and prox lateral lower leg to decrease fascial restrictions and improve mobility.   I strip of sensitive skin Rock tape to Rt lateral prox tibia up to distal lateral quad 25% stretch.  Perpendicular strip applied with 50% stretch at lateral joint line.  I strip of sensitive skin Rock tape applied to medial joint line to  pes anserine with 25% - all to decompress tissue and increase proprioception.        PATIENT EDUCATION:  Education details: progression of exercise; DOMS expectation  Person educated: Patient Education method: Explanation, Demonstration, and Handouts Education comprehension: verbalized understanding and returned demonstration   HOME EXERCISE PROGRAM: Access Code: [email or web address]: [email or web address] Date: 02/23/2022 Prepared by: DOctavio Manns Exercises - Active Straight Leg Raise with Quad Set  - 1 x daily - 7 x weekly - 3 sets - 10 reps - Hooklying Clamshell with Resistance  - 1 x daily - 7 x weekly - 3 sets - 15 reps - Standing Terminal Knee Extension at Wall with Ball  - 1 x daily - 7 x weekly - 3 sets - 10 reps - 5 sec hold  ASSESSMENT:  CLINICAL IMPRESSION: Pt reports increase in right knee discomfort after last session. Weather has been bad that may be adding to difficulty.  Overall pt does not seem to be gaining any lasting pain relief from therapy although strength is improving. Added IASTM to lateral aspect of right knee to loosen tissue and provide pain/edema relief. Significant blanching superior aspect of lateral knee.Trial of k-tape applied.    Patient is a 46y.o. F who was seen today for physical therapy evaluation and treatment for R knee pain. Physical findings are consistent with referring provider impression, as pt demonstrates decreased LE strength and pain during functional mobility. Her FOTO score indicates she is operating below PLOF and would benefit from skilled PT services in order to improve hip and quad strength in order to decrease pain and improve function. PT will alternate gym and aquatic sessions for improving strength in reduced joint load environment.    OBJECTIVE IMPAIRMENTS decreased activity tolerance, decreased endurance, decreased mobility, difficulty walking, decreased ROM, decreased strength, and pain.   ACTIVITY LIMITATIONS  cleaning, community activity, occupation, and yard work.   PERSONAL FACTORS Fitness, Past/current experiences, and 3+ comorbidities: R TKR- 07/05/2021, depression, PTSD,migraine/ TIA (loss of sensation  on L side), L ischemic stroke, T2DM, irregular heartbeat, gastroparesis  are also affecting patient's functional outcome.    REHAB POTENTIAL: Good  CLINICAL DECISION MAKING: Stable/uncomplicated  EVALUATION COMPLEXITY: Low   GOALS: Goals reviewed with patient? No  SHORT TERM GOALS: Target date: 04/19/2022  Pt will be compliant and knowledgeable with initial HEP for improved comfort and carryover Baseline: initial HEP given  Goal status: INITIAL  2.  Pt will self report R knee pain no greater than 5/10 for improved comfort and functional ability Baseline: 7/10 at worst Goal status: INITIAL  LONG TERM GOALS: Target date: 05/24/2022  Pt will self report R knee pain no greater than 2/10 for improved comfort and functional ability Baseline: 7/10 at worst Goal status: INITIAL  2.  Pt will improve FOTO function score to no less than 55% as proxy for functional improvement Baseline: 47% function Goal status: INITIAL  3.  Pt will be able to ambulate up/down 15 stairs with reciprocal gait and no increase in knee pain for improved comfort and functional mobility Baseline: unable  Goal status: INITIAL  4.  Pt will improve all R LE strength to no less than 4+/5 for improved mobility and decreased pain Baseline: see chart Goal status: INITIAL  PLAN: PT FREQUENCY: 1-2x/week  PT DURATION: 8 weeks  PLANNED INTERVENTIONS: Therapeutic exercises, Therapeutic activity, Neuromuscular re-education, Balance training, Gait training, Patient/Family education, Joint mobilization, Aquatic Therapy, Dry Needling, Electrical stimulation, and Manual therapy  PLAN FOR NEXT SESSION:  LE/core strengthening and stretching  Miranda [name] MPT 03/29/22 12:04 PM

## 2022-03-30 ENCOUNTER — Encounter (HOSPITAL_BASED_OUTPATIENT_CLINIC_OR_DEPARTMENT_OTHER): Payer: Self-pay | Admitting: Physical Therapy

## 2022-03-30 ENCOUNTER — Ambulatory Visit (HOSPITAL_BASED_OUTPATIENT_CLINIC_OR_DEPARTMENT_OTHER): Payer: BC Managed Care – PPO | Attending: Nurse Practitioner | Admitting: Physical Therapy

## 2022-03-30 DIAGNOSIS — G8929 Other chronic pain: Secondary | ICD-10-CM | POA: Diagnosis present

## 2022-03-30 DIAGNOSIS — R2689 Other abnormalities of gait and mobility: Secondary | ICD-10-CM | POA: Insufficient documentation

## 2022-03-30 DIAGNOSIS — M25561 Pain in right knee: Secondary | ICD-10-CM | POA: Diagnosis present

## 2022-03-30 DIAGNOSIS — M6281 Muscle weakness (generalized): Secondary | ICD-10-CM | POA: Diagnosis present

## 2022-03-30 DIAGNOSIS — M25562 Pain in left knee: Secondary | ICD-10-CM | POA: Insufficient documentation

## 2022-03-30 NOTE — Therapy (Signed)
OUTPATIENT PHYSICAL THERAPY LOWER EXTREMITY TREATMENT   Patient Name: Miranda [name] MRN: [id number] DOB:1976/02/20, 46 y.o., female Today's Date: 03/30/2022   PT End of Session - 03/30/22 1501     Visit Number 10    Number of Visits 17    Date for PT Re-Evaluation 04/21/22    Authorization Type BCBS    PT Start Time 1450    PT Stop Time 1530    PT Time Calculation (min) 40 min    Activity Tolerance Patient tolerated treatment well    Behavior During Therapy WFL for tasks assessed/performed               Past Medical History:  Diagnosis Date   Anginal pain ([place])    Anxiety    Arthritis    Asthma    Bowel obstruction ([place])    Cardiac arrhythmia    Depression    sees Dr. Matilde Haymaker in [place]    Diabetes mellitus    sees Dr. Madelin Rear    Dyspnea    Dysrhythmia    GERD (gastroesophageal reflux disease)    Hyperlipidemia    Hypertension    Hypothyroidism    sees Dr. Elyse Hsu   Insomnia    Migraine syndrome    se[facility] Dr[facility] 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 08/2017   Stroke ([place]) 03/25/2016   left MCA    Stroke [facility]) 04/2016   se[facility] Teodora Medici at [facility]    Past Surgical History:  Procedure Laterality Date   blocked itestinal repair  1975   age 94 months   BREAST BIOPSY Left 2017   CHOLECYSTECTOMY  2011   INDUCED ABORTION  1996   forced abortion   INGUINAL HERNIA REPAIR Left 1980   age 52   KNEE CLOSED REDUCTION Right 08/22/2021   Procedure: RIGHT  KNEE MANIPULATION UNDER ANESTHESIA;  Surgeon: Meredith Pel, MD;  Location: [place];  Service: Orthopedics;  Laterality: Right;   LAPAROSCOPIC ENDOMETRIOSIS FULGURATION  1998   TOTAL KNEE ARTHROPLASTY Right 07/05/2021   Procedure: TOTAL KNEE  ARTHROPLASTY;  Surgeon: Meredith Pel, MD;  Location: [place];  Service: Orthopedics;  Laterality: Right;   WISDOM TOOTH EXTRACTION  2007   Patient Active Problem List   Diagnosis Date Noted   Arthrofibrosis of knee joint, right    Arthritis of right knee    Diabetic gastroparesis ([place]) 07/20/2021   Constipation 07/20/2021   S/P total knee arthroplasty, right 07/05/2021   Chronic diarrhea 04/29/2021   Belching 04/29/2021   Primary osteoarthritis of both knees 10/28/2020   Irritable bowel syndrome with diarrhea 10/16/2019   Tendinitis of right rotator cuff 09/17/2019   Hand pain, right 05/21/2019   Labral tear of hip joint 05/21/2019   Type 2 diabetes mellitus with diabetic neuropathy, unspecified ([place]) 10/01/2018   Right hip pain 05/22/2018   Tennis Must Quervain's disease (radial styloid tenosynovitis) 04/02/2018   Carpal tunnel syndrome, right upper limb 12/31/2017   Elbow injury, right, initial encounter 12/16/2017   Chronic pain of both shoulders 11/30/2017   PTSD (post-traumatic stress disorder) 10/25/2016   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[place]) 10/25/2016   Diabetes mellitus ([place]) 10/25/2016   Major depressive disorder,  recurrent episode, severe, with psychosis ([place]) 10/20/2016   Major depressive disorder, recurrent episode, severe, 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[place]) 10/19/2016   TIA (transient ischemic attack) 05/16/2016   Arterial ischemic stroke, MCA, left, acute ([place]) 03/29/2016   Plantar fasciitis 08/08/2013   Obesity, Class III, BMI 40-49.9 (morbid obesity) ([place]) 04/09/2013   Unspecified sleep apnea 03/31/2013   PCO (polycystic ovaries) 04/11/2012   Neck pain 11/21/2010   FATTY LIVER DISEASE 01/26/2010   NAUSEA WITH VOMITING 01/26/2010   PORTAL HYPERTENSION 01/26/2010   Migraine headache 06/28/2009   BREAST MASS 08/21/2008   KNEE PAIN 04/14/2008   Hypothyroidism 03/17/2008   Hyperlipidemia 03/17/2008   DEPRESSION 03/17/2008   NEUROPATHY 03/17/2008   Essential  hypertension 03/17/2008   ASTHMA 03/17/2008   GERD 03/17/2008   Headache 03/17/2008    REFERRING PROVIDER:  Vonita Moss, NP  REFERRING DIAG:  pain due to total knee replacement  THERAPY DIAG:  Muscle weakness (generalized)  Other abnormalities of gait and mobility  Chronic pain of right knee  ONSET DATE: Chronic  SUBJECTIVE:   SUBJECTIVE STATEMENT: "My left leg is strengthening.  I feel improvements here and there with Rt knee.  Pain fluctuates day to day".  She states that the ktape helped decrease her pain some.    PERTINENT HISTORY: R TKR- 07/05/2021, depression, PTSD,migraine/ TIA (loss of sensation on L side), L ischemic stroke, T2DM, irregular heartbeat, gastroparesis  PAIN:  Are you having pain? Yes Yes: NPRS scale: 4/10 Pain location: R knee Pain description: sharp,  Aggravating factors: stairs, knee flexion Relieving factors: rest  PRECAUTIONS: None  WEIGHT BEARING RESTRICTIONS No  FALLS:  Has patient fallen in last 6 months? Yes. 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  PLOF: Independent and Independent with basic ADLs  PATIENT GOALS: decrease pain in R knee in order to improve walking and general mobility    OBJECTIVE:   DIAGNOSTIC FINDINGS:   N/A  PATIENT SURVEYS:  FOTO 47% function; 55% predicted  COGNITION:  Overall cognitive status: Within functional limits for tasks assessed     SENSATION: Decreased light touch to R lateral LE  POSTURE:  Medium body habitus, well healed scar to R knee  PALPATION: TTP to medial R knee joint line  LE ROM:  Active ROM Right 03/30/2022 Left 03/30/2022  Hip flexion    Hip extension    Hip abduction    Hip  adduction    Hip internal rotation    Hip external rotation    Knee flexion 110   Knee extension 0   Ankle dorsiflexion    Ankle plantarflexion    Ankle inversion    Ankle eversion     (Blank rows = not tested)  LE MMT:  MMT Right 03/30/2022 Left 03/30/2022  Hip flexion 3+/5 4+/5  Hip extension    Hip abduction 3+/5 3+/5  Hip adduction    Hip internal rotation    Hip external rotation    Knee flexion 4/5 4+/5  Knee extension 4+/5 5/5  Ankle dorsiflexion    Ankle plantarflexion    Ankle inversion    Ankle eversion     (Blank rows = not tested)  LOWER EXTREMITY SPECIAL TESTS:  N/A  FUNCTIONAL TESTS:  30 Second Sit to Stand: 13 reps  GAIT: Distance walked: 53f Assistive device utilized: None Level of assistance: Complete Independence Comments: R antalgic gait  TODAY'S TREATMENT:   [facility] Adult PT Treatment:                                                DATE: 03/30/2022 Pt seen for aquatic therapy today.  Treatment took place in water 3.25-4.8 ft [facility] Temp of water was 91.  Pt entered/exited the pool via stairs step through pattern independently with bilat rail.  Walking forward, back and side stepping multiple widths without UE support Leg press with skinny square noodle x 10 to front, x 10 in hip abdct each leg Monster walk forward/ backward x 6 widths with coordinated arms Straddling noodle cycling; skiing; add/abd - with blue hand buoys - 2 cycles through circuit 3 way leg stretch with LE supported by noodle:  ITB, hamstring, adductor stretch x 15-20 sec each position x 2 each   Viscosity of the water is needed for resistance of strengthening; water current perturbations provides challenge to standing balance unsupported, requiring increased core activation.  Manual:  I strip of regular Rock tape to Rt lateral prox tibia up to distal lateral quad 25% stretch.  Perpendicular strip applied with 50% stretch at lateral joint line.   I strip of sensitive skin Rock tape applied to medial joint line to pes anserine with 25% - all to decompress tissue and increase proprioception.     PATIENT EDUCATION:  Education details: progression of exercise; DOMS expectation  Person educated: Patient Education method: Explanation, Demonstration, and Handouts Education comprehension: verbalized understanding and returned demonstration   HOME EXERCISE PROGRAM: Access Code: [email or web address]: [email or web address] Date: 02/23/2022 Prepared by: DOctavio Manns Exercises - Active Straight Leg Raise with Quad Set  - 1 x daily - 7 x weekly - 3 sets - 10 reps - Hooklying Clamshell with Resistance  - 1 x daily - 7 x weekly - 3 sets - 15 reps - Standing Terminal Knee Extension at Wall with Ball  - 1 x daily - 7 x weekly - 3 sets - 10 reps - 5 sec hold  ASSESSMENT:  CLINICAL IMPRESSION: Positive response to [employer] Data [employer]tape application; reported less pain.  She reports strength difference between legs RLE > LLE.  She tolerated exercises well, including LE stretches with noodle. No irritation with sensitive skin tap removal.  Trial of regular Rock tape today. Progressing towards goals. Pt will benefit from skilled PT services in order to improve hip and quad strength in order to decrease pain and improve function. PT will alternate gym and aquatic sessions for improving strength in reduced joint load environment.    OBJECTIVE IMPAIRMENTS decreased activity tolerance, decreased endurance, decreased mobility, difficulty walking, decreased ROM, decreased strength, and pain.   ACTIVITY LIMITATIONS cleaning, community activity, occupation, and yard work.   PERSONAL FACTORS Fitness, Past/current experiences, and 3+ comorbidities: R TKR- 07/05/2021, depression, PTSD,migraine/ TIA (loss of sensation on L side), L ischemic stroke, T2DM, irregular heartbeat, gastroparesis  are also affecting patient's functional outcome.    REHAB  POTENTIAL: Good  CLINICAL DECISION MAKING: Stable/uncomplicated  EVALUATION COMPLEXITY: Low   GOALS: Goals reviewed with patient? No  SHORT TERM GOALS: Target date: 04/20/2022  Pt will be compliant and knowledgeable with initial  HEP for improved comfort and carryover Baseline: initial HEP given  Goal status: INITIAL  2.  Pt will self report R knee pain no greater than 5/10 for improved comfort and functional ability Baseline: 7/10 at worst Goal status: INITIAL  LONG TERM GOALS: Target date: 05/25/2022  Pt will self report R knee pain no greater than 2/10 for improved comfort and functional ability Baseline: 7/10 at worst Goal status: INITIAL  2.  Pt will improve FOTO function score to no less than 55% as proxy for functional improvement Baseline: 47% function Goal status: INITIAL  3.  Pt will be able to ambulate up/down 15 stairs with reciprocal gait and no increase in knee pain for improved comfort and functional mobility Baseline: unable  Goal status: INITIAL  4.  Pt will improve all R LE strength to no less than 4+/5 for improved mobility and decreased pain Baseline: see chart Goal status: INITIAL  PLAN: PT FREQUENCY: 1-2x/week  PT DURATION: 8 weeks  PLANNED INTERVENTIONS: Therapeutic exercises, Therapeutic activity, Neuromuscular re-education, Balance training, Gait training, Patient/Family education, Joint mobilization, Aquatic Therapy, Dry Needling, Electrical stimulation, and Manual therapy  PLAN FOR NEXT SESSION:  LE/core strengthening and stretching  Kerin Perna, PTA 03/30/22 3:30 PM

## 2022-04-05 ENCOUNTER — Encounter (HOSPITAL_BASED_OUTPATIENT_CLINIC_OR_DEPARTMENT_OTHER): Payer: Self-pay | Admitting: Physical Therapy

## 2022-04-05 ENCOUNTER — Ambulatory Visit (HOSPITAL_BASED_OUTPATIENT_CLINIC_OR_DEPARTMENT_OTHER): Payer: BC Managed Care – PPO | Admitting: Physical Therapy

## 2022-04-05 DIAGNOSIS — M6281 Muscle weakness (generalized): Secondary | ICD-10-CM

## 2022-04-05 DIAGNOSIS — G8929 Other chronic pain: Secondary | ICD-10-CM

## 2022-04-05 DIAGNOSIS — R2689 Other abnormalities of gait and mobility: Secondary | ICD-10-CM

## 2022-04-05 NOTE — Therapy (Signed)
OUTPATIENT PHYSICAL THERAPY LOWER EXTREMITY TREATMENT   Patient Name: Miranda [name] MRN: [id number] DOB:July 14, 1976, 46 y.o., female Today's Date: 04/05/2022   PT End of Session - 04/05/22 1502     Visit Number 11    Number of Visits 17    Date for PT Re-Evaluation 04/21/22    Authorization Type BCBS    PT Start Time 1416    PT Stop Time 1500    PT Time Calculation (min) 44 min    Activity Tolerance Patient tolerated treatment well    Behavior During Therapy WFL for tasks assessed/performed                Past Medical History:  Diagnosis Date   Anginal pain ([place])    Anxiety    Arthritis    Asthma    Bowel obstruction ([place])    Cardiac arrhythmia    Depression    sees Dr. Matilde Haymaker in [place]    Diabetes mellitus    sees Dr. Madelin Rear    Dyspnea    Dysrhythmia    GERD (gastroesophageal reflux disease)    Hyperlipidemia    Hypertension    Hypothyroidism    sees Dr. Elyse Hsu   Insomnia    Migraine syndrome    sees Dr. 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 08/2017   Stroke ([place]) 03/25/2016   left MCA    Stroke [facility]) 04/2016   sees Dr. Teodora Medici at [facility]    Past Surgical History:  Procedure Laterality Date   blocked itestinal repair  1975   age 15 months   BREAST BIOPSY Left 2017   CHOLECYSTECTOMY  2011   INDUCED ABORTION  1996   forced abortion   INGUINAL HERNIA REPAIR Left 1980   age 25   KNEE CLOSED REDUCTION Right 08/22/2021   Procedure: RIGHT  KNEE MANIPULATION UNDER ANESTHESIA;  Surgeon: Meredith Pel, MD;  Location: [place];  Service: Orthopedics;  Laterality: Right;   LAPAROSCOPIC ENDOMETRIOSIS FULGURATION  1998   TOTAL KNEE ARTHROPLASTY Right 07/05/2021   Procedure: TOTAL  KNEE ARTHROPLASTY;  Surgeon: Meredith Pel, MD;  Location: [place];  Service: Orthopedics;  Laterality: Right;   WISDOM TOOTH EXTRACTION  2007   Patient Active Problem List   Diagnosis Date Noted   Arthrofibrosis of knee joint, right    Arthritis of right knee    Diabetic gastroparesis ([place]) 07/20/2021   Constipation 07/20/2021   S/P total knee arthroplasty, right 07/05/2021   Chronic diarrhea 04/29/2021   Belching 04/29/2021   Primary osteoarthritis of both knees 10/28/2020   Irritable bowel syndrome with diarrhea 10/16/2019   Tendinitis of right rotator cuff 09/17/2019   Hand pain, right 05/21/2019   Labral tear of hip joint 05/21/2019   Type 2 diabetes mellitus with diabetic neuropathy, unspecified ([place]) 10/01/2018   Right hip pain 05/22/2018   Tennis Must Quervain's disease (radial styloid tenosynovitis) 04/02/2018   Carpal tunnel syndrome, right upper limb 12/31/2017   Elbow injury, right, initial encounter 12/16/2017   Chronic pain of both shoulders 11/30/2017   PTSD (post-traumatic stress disorder) 10/25/2016   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[place]) 10/25/2016   Diabetes mellitus ([place]) 10/25/2016   Major depressive  disorder, recurrent episode, severe, with psychosis ([place]) 10/20/2016   Major depressive disorder, recurrent episode, severe, 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[place]) 10/19/2016   TIA (transient ischemic attack) 05/16/2016   Arterial ischemic stroke, MCA, left, acute ([place]) 03/29/2016   Plantar fasciitis 08/08/2013   Obesity, Class III, BMI 40-49.9 (morbid obesity) ([place]) 04/09/2013   Unspecified sleep apnea 03/31/2013   PCO (polycystic ovaries) 04/11/2012   Neck pain 11/21/2010   FATTY LIVER DISEASE 01/26/2010   NAUSEA WITH VOMITING 01/26/2010   PORTAL HYPERTENSION 01/26/2010   Migraine headache 06/28/2009   BREAST MASS 08/21/2008   KNEE PAIN 04/14/2008   Hypothyroidism 03/17/2008   Hyperlipidemia 03/17/2008   DEPRESSION 03/17/2008   NEUROPATHY 03/17/2008   Essential  hypertension 03/17/2008   ASTHMA 03/17/2008   GERD 03/17/2008   Headache 03/17/2008    REFERRING PROVIDER:  Vonita Moss, NP  REFERRING DIAG:  pain due to total knee replacement  THERAPY DIAG:  Muscle weakness (generalized)  Other abnormalities of gait and mobility  Chronic pain of right knee  ONSET DATE: Chronic  SUBJECTIVE:   SUBJECTIVE STATEMENT: "My right knee is better. "  No pain above knee on thigh just on the lat aspect of joint line to just lateral of mid patellar.    PERTINENT HISTORY: R TKR- 07/05/2021, depression, PTSD,migraine/ TIA (loss of sensation on L side), L ischemic stroke, T2DM, irregular heartbeat, gastroparesis  PAIN:  Are you having pain? Yes Yes: NPRS scale: 4/10 Pain location: R knee Pain description: sharp,  Aggravating factors: stairs, knee flexion Relieving factors: rest  PRECAUTIONS: None  WEIGHT BEARING RESTRICTIONS No  FALLS:  Has patient fallen in last 6 months? Yes. 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  PLOF: Independent and Independent with basic ADLs  PATIENT GOALS: decrease pain in R knee in order to improve walking and general mobility    OBJECTIVE:   DIAGNOSTIC FINDINGS:   N/A  PATIENT SURVEYS:  FOTO 47% function; 55% predicted  COGNITION:  Overall cognitive status: Within functional limits for tasks assessed     SENSATION: Decreased light touch to R lateral LE  POSTURE:  Medium body habitus, well healed scar to R knee  PALPATION: TTP to medial R knee joint line  LE ROM:  Active ROM Right 04/05/2022 Left 04/05/2022  Hip flexion    Hip extension    Hip abduction    Hip adduction    Hip internal rotation     Hip external rotation    Knee flexion 110   Knee extension 0   Ankle dorsiflexion    Ankle plantarflexion    Ankle inversion    Ankle eversion     (Blank rows = not tested)  LE MMT:  MMT Right 04/05/2022 Left 04/05/2022  Hip flexion 3+/5 4+/5  Hip extension    Hip abduction 3+/5 3+/5  Hip adduction    Hip internal rotation    Hip external rotation    Knee flexion 4/5 4+/5  Knee extension 4+/5 5/5  Ankle dorsiflexion    Ankle plantarflexion    Ankle inversion    Ankle eversion     (Blank rows = not tested)  LOWER EXTREMITY SPECIAL TESTS:  N/A  FUNCTIONAL TESTS:  30 Second Sit  to Stand: 13 reps  GAIT: Distance walked: 20f Assistive device utilized: None Level of assistance: Complete [place] Comments: R antalgic gait  TODAY'S TREATMENT:   [facility] Adult PT Treatment:                                                DATE: 03/30/2022 Pt seen for aquatic therapy today.  Treatment took place in water 3.25-4.[facility] ft [facility] Temp of [facility] was 91.  Pt entered/exited the pool via stairs step through pattern independently with bilat rail.  Walking forward, back and side stepping multiple widths without UE support Straddling noodle cycling; skiing; add/abd - with blue hand buoys - 4 cycles through circuit Leg press with skinny square noodle 2 x 10 to front, x 10 in hip abdct each leg Monster walk forward/ backward x 6 widths with coordinated arms 3 way leg stretch with LE supported by noodle:  ITB, hamstring, adductor stretch x 15-20 sec each position x 2 each  Quad/hip flex stretch R/L supported on wall R/L 3x20s hold  Viscosity of the water is needed for resistance of strengthening; water current perturbations provides challenge to standing balance unsupported, requiring increased core activation.     PATIENT EDUCATION:  Education details: progression of exercise; DOMS expectation  Person educated: Patient Education method: Explanation,  Demonstration, and Handouts Education comprehension: verbalized understanding and returned demonstration   HOME EXERCISE PROGRAM: Access Code: [email or web address]: [email or web address] Date: 02/23/2022 Prepared by: DOctavio Manns Exercises - Active Straight Leg Raise with Quad Set  - 1 x daily - 7 x weekly - 3 sets - 10 reps - Hooklying Clamshell with Resistance  - 1 x daily - 7 x weekly - 3 sets - 15 reps - Standing Terminal Knee Extension at Wall with Ball  - 1 x daily - 7 x weekly - 3 sets - 10 reps - 5 sec hold  ASSESSMENT:  CLINICAL IMPRESSION: Continued progress with K-taping.  She reports complete relief of sx above right knee and localized pain lat aspect of knee at joint line to tib tuberosity.  She has an alternate taping pattern which she completed indep after looking on line running vertically from lat joint line to lateral malleolus  and anterior tib.  She responds very well to treatment today. Spent added time on stretching.  R quad tight. Goals ongoing.    OBJECTIVE IMPAIRMENTS decreased activity tolerance, decreased endurance, decreased mobility, difficulty walking, decreased ROM, decreased strength, and pain.   ACTIVITY LIMITATIONS cleaning, community activity, occupation, and yard work.   PERSONAL FACTORS Fitness, Past/current experiences, and 3+ comorbidities: R TKR- 07/05/2021, depression, PTSD,migraine/ TIA (loss of sensation on L side), L ischemic stroke, T2DM, irregular heartbeat, gastroparesis  are also affecting patient's functional outcome.    REHAB POTENTIAL: Good  CLINICAL DECISION MAKING: Stable/uncomplicated  EVALUATION COMPLEXITY: Low   GOALS: Goals reviewed with patient? No  SHORT TERM GOALS: Target date: 04/26/2022  Pt will be compliant and knowledgeable with initial HEP for improved comfort and carryover Baseline: initial HEP given  Goal status: INITIAL  2.  Pt will self report R knee pain no greater than 5/10 for improved comfort and  functional ability Baseline: 7/10 at worst Goal status: INITIAL  LONG TERM GOALS: Target date: 05/31/2022  Pt will self report R knee pain no greater than 2/10 for  improved comfort and functional ability Baseline: 7/10 at worst Goal status: INITIAL  2.  Pt will improve FOTO function score to no less than 55% as proxy for functional improvement Baseline: 47% function Goal status: INITIAL  3.  Pt will be able to ambulate up/down 15 stairs with reciprocal gait and no increase in knee pain for improved comfort and functional mobility Baseline: unable  Goal status: INITIAL  4.  Pt will improve all R LE strength to no less than 4+/5 for improved mobility and decreased pain Baseline: see chart Goal status: INITIAL  PLAN: PT FREQUENCY: 1-2x/week  PT DURATION: 8 weeks  PLANNED INTERVENTIONS: Therapeutic exercises, Therapeutic activity, Neuromuscular re-education, Balance training, Gait training, Patient/Family education, Joint mobilization, Aquatic Therapy, Dry Needling, Electrical stimulation, and Manual therapy  PLAN FOR NEXT SESSION:  LE/core strengthening and stretching  Miranda [name] MPT 04/05/22 3:03 PM

## 2022-04-07 ENCOUNTER — Encounter (HOSPITAL_BASED_OUTPATIENT_CLINIC_OR_DEPARTMENT_OTHER): Payer: Self-pay | Admitting: Physical Therapy

## 2022-04-07 ENCOUNTER — Ambulatory Visit (HOSPITAL_BASED_OUTPATIENT_CLINIC_OR_DEPARTMENT_OTHER): Payer: BC Managed Care – PPO | Admitting: Physical Therapy

## 2022-04-07 DIAGNOSIS — M6281 Muscle weakness (generalized): Secondary | ICD-10-CM | POA: Diagnosis not present

## 2022-04-07 DIAGNOSIS — M25562 Pain in left knee: Secondary | ICD-10-CM

## 2022-04-07 DIAGNOSIS — G8929 Other chronic pain: Secondary | ICD-10-CM

## 2022-04-07 DIAGNOSIS — R2689 Other abnormalities of gait and mobility: Secondary | ICD-10-CM

## 2022-04-07 NOTE — Therapy (Signed)
OUTPATIENT PHYSICAL THERAPY LOWER EXTREMITY TREATMENT   Patient Name: Miranda [name] MRN: [id number] DOB:[birth year], 46 y.o., female Today's Date: 04/07/2022   PT End of Session - 04/07/22 1352     Visit Number 12    Number of Visits 17    Date for PT Re-Evaluation 04/21/22    Authorization Type BCBS    PT Start Time 1350    PT Stop Time 1430    PT Time Calculation (min) 40 min    Activity Tolerance Patient tolerated treatment well    Behavior During Therapy WFL for tasks assessed/performed                Past Medical History:  Diagnosis Date   Anginal pain ([place])    Anxiety    Arthritis    Asthma    Bowel obstruction ([place])    Cardiac arrhythmia    Depression    sees Dr. Matilde Haymaker in [place]    Diabetes mellitus    sees Dr. Madelin Rear    Dyspnea    Dysrhythmia    GERD (gastroesophageal reflux disease)    Hyperlipidemia    Hypertension    Hypothyroidism    sees Dr. Elyse Hsu   Insomnia    Migraine syndrome    se[facility] Dr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 08/2017   Stroke ([place]) 03/25/2016   left MCA    Stroke [facility]) 04/2016   sees Dr. Teodora Medici at [facility]    Past Surgical History:  Procedure Laterality Date   blocked itestinal repair  1975   age 66 months   BREAST BIOPSY Left 2017   CHOLECYSTECTOMY  2011   INDUCED ABORTION  1996   forced abortion   INGUINAL HERNIA REPAIR Left 1980   age 5   KNEE CLOSED REDUCTION Right 08/22/2021   Procedure: RIGHT  KNEE MANIPULATION UNDER ANESTHESIA;  Surgeon: Meredith Pel, MD;  Location: [place];  Service: Orthopedics;  Laterality: Right;   LAPAROSCOPIC ENDOMETRIOSIS FULGURATION  1998   TOTAL KNEE ARTHROPLASTY Right 07/05/2021   Procedure: TOTAL  KNEE ARTHROPLASTY;  Surgeon: Meredith Pel, MD;  Location: [place];  Service: Orthopedics;  Laterality: Right;   WISDOM TOOTH EXTRACTION  2007   Patient Active Problem List   Diagnosis Date Noted   Arthrofibrosis of knee joint, right    Arthritis of right knee    Diabetic gastroparesis ([place]) 07/20/2021   Constipation 07/20/2021   S/P total knee arthroplasty, right 07/05/2021   Chronic diarrhea 04/29/2021   Belching 04/29/2021   Primary osteoarthritis of both knees 10/28/2020   Irritable bowel syndrome with diarrhea 10/16/2019   Tendinitis of right rotator cuff 09/17/2019   Hand pain, right 05/21/2019   Labral tear of hip joint 05/21/2019   Type 2 diabetes mellitus with diabetic neuropathy, unspecified ([place]) 10/01/2018   Right hip pain 05/22/2018   Tennis Must Quervain's disease (radial styloid tenosynovitis) 04/02/2018   Carpal tunnel syndrome, right upper limb 12/31/2017   Elbow injury, right, initial encounter 12/16/2017   Chronic pain of both shoulders 11/30/2017   PTSD (post-traumatic stress disorder) 10/25/2016   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[place]) 10/25/2016   Diabetes mellitus ([place]) 10/25/2016   Major depressive  disorder, recurrent episode, severe, with psychosis ([place]) 10/20/2016   Major depressive disorder, recurrent episode, severe, 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[place]) 10/19/2016   TIA (transient ischemic attack) 05/16/2016   Arterial ischemic stroke, MCA, left, acute ([place]) 03/29/2016   Plantar fasciitis 08/08/2013   Obesity, Class III, BMI 40-49.9 (morbid obesity) ([place]) 04/09/2013   Unspecified sleep apnea 03/31/2013   PCO (polycystic ovaries) 04/11/2012   Neck pain 11/21/2010   FATTY LIVER DISEASE 01/26/2010   NAUSEA WITH VOMITING 01/26/2010   PORTAL HYPERTENSION 01/26/2010   Migraine headache 06/28/2009   BREAST MASS 08/21/2008   KNEE PAIN 04/14/2008   Hypothyroidism 03/17/2008   Hyperlipidemia 03/17/2008   DEPRESSION 03/17/2008   NEUROPATHY 03/17/2008   Essential  hypertension 03/17/2008   ASTHMA 03/17/2008   GERD 03/17/2008   Headache 03/17/2008    REFERRING PROVIDER:  Vonita Moss, NP  REFERRING DIAG:  pain due to total knee replacement  THERAPY DIAG:  Muscle weakness (generalized)  Other abnormalities of gait and mobility  Chronic pain of right knee  Chronic pain of left knee  ONSET DATE: Chronic  SUBJECTIVE:   SUBJECTIVE STATEMENT: Pt reports that her knee pain is now "more predictable"; painful with certain activities.  No longer constant.  Her buttocks has been more painful lately (not specific to location) that her knee pain seems less.  She plans to get it checked out.  She has been taping her Rt knee with good relief.    PERTINENT HISTORY: R TKR- 07/05/2021, depression, PTSD,migraine/ TIA (loss of sensation on L side), L ischemic stroke, T2DM, irregular heartbeat, gastroparesis  PAIN:  Are you having pain? Yes NPRS scale: 5-7/10 Pain location: buttocks Pain description: sharp,  Aggravating factors: sitting  Relieving factors: rolling on side  PRECAUTIONS: None  WEIGHT BEARING RESTRICTIONS No  FALLS:  Has patient fallen in last 6 months? Yes. 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  PLOF: Independent and Independent with basic ADLs  PATIENT GOALS: decrease pain in R knee in order to improve walking and general mobility    OBJECTIVE:   DIAGNOSTIC FINDINGS:   N/A  PATIENT SURVEYS:  FOTO 47% function; 55% predicted  COGNITION:  Overall cognitive status: Within functional limits for tasks assessed     SENSATION: Decreased light touch to R lateral LE  POSTURE:  Medium body habitus, well healed scar to R  knee  PALPATION: TTP to medial R knee joint line  LE ROM:  Active ROM Right 04/07/2022 Left 04/07/2022  Hip flexion    Hip extension    Hip abduction    Hip adduction    Hip internal rotation    Hip external rotation    Knee flexion 110   Knee extension 0   Ankle dorsiflexion    Ankle plantarflexion    Ankle inversion    Ankle eversion     (Blank rows = not tested)  LE MMT:  MMT Right 04/07/2022 Left 04/07/2022  Hip flexion 3+/5 4+/5  Hip extension    Hip abduction 3+/5 3+/5  Hip adduction    Hip internal rotation    Hip external rotation    Knee flexion 4/5 4+/5  Knee extension 4+/5 5/5  Ankle dorsiflexion    Ankle plantarflexion  Ankle inversion    Ankle eversion     (Blank rows = not tested)  LOWER EXTREMITY SPECIAL TESTS:  N/A  FUNCTIONAL TESTS:  30 Second Sit to Stand: 13 reps  GAIT: Distance walked: 41f Assistive device utilized: None Level of assistance: Complete [place] Comments: R antalgic gait  TODAY'S TREATMENT:   [facility] Adult PT Treatment:                                                DATE: 03/30/2022 Pt seen for aquatic therapy today.  Treatment took place in water 3.25-4.8 ft [facility] Temp of water was 91.  Pt entered/exited the pool via stairs step through pattern independently with bilat rail.  Walking forward, back without UE support Squats with long pause at bottom to stretch knees x 10 Leg press with skinny square noodle 2 x 10 to front, each leg Backward/ forward monster walk x 3 laps  Forward step ups with retro step downs (2 steps with rail/ x 5 reps ) Straddling blue thick squatre noodle: cycling; skiing; add/abd - with blue hand buoys - 3 cycles through circuit 3 way leg stretch with LE supported by noodle:  ITB, hamstring, adductor stretch x 15-20 sec each position x 2 each  Quad/hip flex stretch R/L supported on wall R/L 3x20s hold  Viscosity of the water is needed for resistance of  strengthening; water current perturbations provides challenge to standing balance unsupported, requiring increased core activation.     PATIENT EDUCATION:  Education details: progression of exercise; DOMS expectation  Person educated: Patient Education method: Explanation, Demonstration, and Handouts Education comprehension: verbalized understanding and returned demonstration   HOME EXERCISE PROGRAM: Access Code: [email or web address]: [email or web address] Date: 02/23/2022 Prepared by: DOctavio Manns Exercises - Active Straight Leg Raise with Quad Set  - 1 x daily - 7 x weekly - 3 sets - 10 reps - Hooklying Clamshell with Resistance  - 1 x daily - 7 x weekly - 3 sets - 15 reps - Standing Terminal Knee Extension at Wall with Ball  - 1 x daily - 7 x weekly - 3 sets - 10 reps - 5 sec hold  ASSESSMENT:  CLINICAL IMPRESSION: Pt reported increased Lt knee pain during stair exercise (both up/down).  She reports sharp pain in post R knee (near biceps femoris insertion with attempt at standing hamstring curl).  All other exercises tolerated well.  Overall positive response the therapy; having less constant pain.  Progressing towards goals.     OBJECTIVE IMPAIRMENTS decreased activity tolerance, decreased endurance, decreased mobility, difficulty walking, decreased ROM, decreased strength, and pain.   ACTIVITY LIMITATIONS cleaning, community activity, occupation, and yard work.   PERSONAL FACTORS Fitness, Past/current experiences, and 3+ comorbidities: R TKR- 07/05/2021, depression, PTSD,migraine/ TIA (loss of sensation on L side), L ischemic stroke, T2DM, irregular heartbeat, gastroparesis  are also affecting patient's functional outcome.    REHAB POTENTIAL: Good  CLINICAL DECISION MAKING: Stable/uncomplicated  EVALUATION COMPLEXITY: Low   GOALS: Goals reviewed with patient? No  SHORT TERM GOALS: Target date: 04/28/2022  Pt will be compliant and knowledgeable with initial  HEP for improved comfort and carryover Baseline: initial HEP given  Goal status: INITIAL  2.  Pt will self report R knee pain no greater than 5/10 for improved comfort and functional ability Baseline: 7/10 at  worst Goal status: INITIAL  LONG TERM GOALS: Target date: 06/02/2022  Pt will self report R knee pain no greater than 2/10 for improved comfort and functional ability Baseline: 7/10 at worst Goal status: INITIAL  2.  Pt will improve FOTO function score to no less than 55% as proxy for functional improvement Baseline: 47% function Goal status: INITIAL  3.  Pt will be able to ambulate up/down 15 stairs with reciprocal gait and no increase in knee pain for improved comfort and functional mobility Baseline: unable  Goal status: INITIAL  4.  Pt will improve all R LE strength to no less than 4+/5 for improved mobility and decreased pain Baseline: see chart Goal status: INITIAL  PLAN: PT FREQUENCY: 1-2x/week  PT DURATION: 8 weeks  PLANNED INTERVENTIONS: Therapeutic exercises, Therapeutic activity, Neuromuscular re-education, Balance training, Gait training, Patient/Family education, Joint mobilization, Aquatic Therapy, Dry Needling, Electrical stimulation, and Manual therapy  PLAN FOR NEXT SESSION:  LE/core strengthening and stretching; stairs as able   [employer], PTA 04/07/22 2:32 PM

## 2022-04-10 ENCOUNTER — Ambulatory Visit (HOSPITAL_BASED_OUTPATIENT_CLINIC_OR_DEPARTMENT_OTHER): Payer: BC Managed Care – PPO | Admitting: Physical Therapy

## 2022-04-10 ENCOUNTER — Encounter (HOSPITAL_BASED_OUTPATIENT_CLINIC_OR_DEPARTMENT_OTHER): Payer: Self-pay | Admitting: Physical Therapy

## 2022-04-10 DIAGNOSIS — G8929 Other chronic pain: Secondary | ICD-10-CM

## 2022-04-10 DIAGNOSIS — M6281 Muscle weakness (generalized): Secondary | ICD-10-CM | POA: Diagnosis not present

## 2022-04-10 DIAGNOSIS — R2689 Other abnormalities of gait and mobility: Secondary | ICD-10-CM

## 2022-04-10 NOTE — Therapy (Signed)
OUTPATIENT PHYSICAL THERAPY LOWER EXTREMITY TREATMENT   Patient Name: Miranda [name] MRN: [id number] DOB:Jun 01, 1976, 46 y.o., female Today's Date: 04/10/2022   PT End of Session - 04/10/22 1010     Visit Number 13    Number of Visits 17    Date for PT Re-Evaluation 04/21/22    Authorization Type BC[employer]    PT Start Time 0950    PT Stop Time 1030    PT Time Calculation (min) 40 min    Activity Tolerance Patient tolerated treatment well    Behavior During Therapy [facility] for tasks assessed/performed                Past Medical History:  Diagnosis Date   Anginal pain ([place])    Anxiety    Arthritis    Asthma    Bowel obstruction ([place])    Cardiac arrhythmia    Depression    sees Dr. Matilde Haymaker in [place]    Diabetes mellitus    sees Dr. Madelin Rear    Dyspnea    Dysrhythmia    GERD (gastroesophageal reflux disease)    Hyperlipidemia    Hypertension    Hypothyroidism    sees Dr. Elyse Hsu   Insomnia    Migraine syndrome    sees Dr. 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 08/2017   Stroke ([place]) 03/25/2016   left MCA    Stroke [facility]) 04/2016   sees Dr. Teodora Medici at [facility]    Past Surgical History:  Procedure Laterality Date   blocked itestinal repair  1975   age 64 months   BREAST BIOPSY Left 2017   CHOLECYSTECTOMY  2011   INDUCED ABORTION  1996   forced abortion   INGUINAL HERNIA REPAIR Left 1980   age 79   KNEE CLOSED REDUCTION Right 08/22/2021   Procedure: RIGHT  KNEE MANIPULATION UNDER ANESTHESIA;  Surgeon: Meredith Pel, MD;  Location: [place];  Service: Orthopedics;  Laterality: Right;   LAPAROSCOPIC ENDOMETRIOSIS FULGURATION  1998   TOTAL KNEE ARTHROPLASTY Right 07/05/2021   Procedure: TOTAL  KNEE ARTHROPLASTY;  Surgeon: Meredith Pel, MD;  Location: [place];  Service: Orthopedics;  Laterality: Right;   WISDOM TOOTH EXTRACTION  2007   Patient Active Problem List   Diagnosis Date Noted   Arthrofibrosis of knee joint, right    Arthritis of right knee    Diabetic gastroparesis ([place]) 07/20/2021   Constipation 07/20/2021   S/P total knee arthroplasty, right 07/05/2021   Chronic diarrhea 04/29/2021   Belching 04/29/2021   Primary osteoarthritis of both knees 10/28/2020   Irritable bowel syndrome with diarrhea 10/16/2019   Tendinitis of right rotator cuff 09/17/2019   Hand pain, right 05/21/2019   Labral tear of hip joint 05/21/2019   Type 2 diabetes mellitus with diabetic neuropathy, unspecified ([place]) 10/01/2018   Right hip pain 05/22/2018   Tennis Must Quervain's disease (radial styloid tenosynovitis) 04/02/2018   Carpal tunnel syndrome, right upper limb 12/31/2017   Elbow injury, right, initial encounter 12/16/2017   Chronic pain of both shoulders 11/30/2017   PTSD (post-traumatic stress disorder) 10/25/2016   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[place]) 10/25/2016   Diabetes mellitus ([place]) 10/25/2016   Major depressive  disorder, recurrent episode, severe, with psychosis ([place]) 10/20/2016   Major depressive disorder, recurrent episode, severe, 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[place]) 10/19/2016   TIA (transient ischemic attack) 05/16/2016   Arterial ischemic stroke, MCA, left, acute ([place]) 03/29/2016   Plantar fasciitis 08/08/2013   Obesity, Class III, BMI 40-49.9 (morbid obesity) ([place]) 04/09/2013   Unspecified sleep apnea 03/31/2013   PCO (polycystic ovaries) 04/11/2012   Neck pain 11/21/2010   FATTY LIVER DISEASE 01/26/2010   NAUSEA WITH VOMITING 01/26/2010   PORTAL HYPERTENSION 01/26/2010   Migraine headache 06/28/2009   BREAST MASS 08/21/2008   KNEE PAIN 04/14/2008   Hypothyroidism 03/17/2008   Hyperlipidemia 03/17/2008   DEPRESSION 03/17/2008   NEUROPATHY 03/17/2008   Essential  hypertension 03/17/2008   ASTHMA 03/17/2008   GERD 03/17/2008   Headache 03/17/2008    REFERRING PROVIDER:  Vonita Moss, NP  REFERRING DIAG:  pain due to total knee replacement  THERAPY DIAG:  Muscle weakness (generalized)  Other abnormalities of gait and mobility  Chronic pain of right knee  ONSET DATE: Chronic  SUBJECTIVE:   SUBJECTIVE STATEMENT: "Knee is much better. I am having a burning sensation throughout the bottom half of my body, painful urination and BM.Marland Kitchen CAT scan ordered".   PERTINENT HISTORY: R TKR- 07/05/2021, depression, PTSD,migraine/ TIA (loss of sensation on L side), L ischemic stroke, T2DM, irregular heartbeat, gastroparesis  PAIN:  Are you having pain? Yes NPRS scale: 2-3/10 Pain location:right knee Pain description: sharp,  Aggravating factors: sitting  Relieving factors: rolling on side  PRECAUTIONS: None  WEIGHT BEARING RESTRICTIONS No  FALLS:  Has patient fallen in last 6 months? Yes. 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  PLOF: Independent and Independent with basic ADLs  PATIENT GOALS: decrease pain in R knee in order to improve walking and general mobility    OBJECTIVE:   DIAGNOSTIC FINDINGS:   N/A  PATIENT SURVEYS:  FOTO 47% function; 55% predicted  COGNITION:  Overall cognitive status: Within functional limits for tasks assessed     SENSATION: Decreased light touch to R lateral LE  POSTURE:  Medium body habitus, well healed scar to R knee  PALPATION: TTP to medial R knee joint line  LE ROM:  Active ROM Right 04/10/2022 Left 04/10/2022  Hip flexion    Hip extension    Hip abduction    Hip adduction    Hip internal  rotation    Hip external rotation    Knee flexion 110   Knee extension 0   Ankle dorsiflexion    Ankle plantarflexion    Ankle inversion    Ankle eversion     (Blank rows = not tested)  LE MMT:  MMT Right 04/10/2022 Left 04/10/2022  Hip flexion 3+/5 4+/5  Hip extension    Hip abduction 3+/5 3+/5  Hip adduction    Hip internal rotation    Hip external rotation    Knee flexion 4/5 4+/5  Knee extension 4+/5 5/5  Ankle dorsiflexion    Ankle plantarflexion    Ankle inversion    Ankle eversion     (Blank rows = not tested)  LOWER EXTREMITY SPECIAL TESTS:  N/A  FUNCTIONAL TESTS:  30 Second Sit to Stand: 13 reps  GAIT: Distance walked: 31f Assistive device utilized: None Level of assistance: Complete Independence Comments: R antalgic gait  TODAY'S TREATMENT:   [facility] Adult PT Treatment:                                                DATE: 03/30/2022 Pt seen for aquatic therapy today.  Treatment took place in water 3.25-4.[facility] ft [facility] Temp of water was 91.  Pt entered/exited the pool via stairs step through pattern independently with bilat rail.  Walking forward, back without UE support Squats with long pause at bottom to stretch knees x 10 on 1st step Backward/ forward monster walk x 3 widths  Hurdle walk x 3 widths Forward step ups with retro step downs R/L (2 steps with rail/ x 7 reps ) Backward lung x10 Forward lunge x 10 Straddling blue thick squatre noodle: cycling; skiing; add/abd - with blue hand buoys - 3 cycles through circuit 3 way leg stretch with LE supported by noodle:  ITB, hamstring, adductor stretch x 15-20 sec each position x 2 each  Quad/hip flex stretch R/L supported on wall R/L 3x20s hold  Viscosity of the water is needed for resistance of strengthening; water current perturbations provides challenge to standing balance unsupported, requiring increased core activation.     PATIENT EDUCATION:  Education details:  progression of exercise; DOMS expectation  Person educated: Patient Education method: Explanation, Demonstration, and Handouts Education comprehension: verbalized understanding and returned demonstration   HOME EXERCISE PROGRAM: Access Code: [email or web address]: [email or web address] Date: 02/23/2022 Prepared by: DOctavio Manns Exercises - Active Straight Leg Raise with Quad Set  - 1 x daily - 7 x weekly - 3 sets - 10 reps - Hooklying Clamshell with Resistance  - 1 x daily - 7 x weekly - 3 sets - 15 reps - Standing Terminal Knee Extension at Wall with Ball  - 1 x daily - 7 x weekly - 3 sets - 10 reps - 5 sec hold  ASSESSMENT:  CLINICAL IMPRESSION: Continued improvement in right knee pain. She reports she continues to K-tape as shown.  No discomfort with step ups improved from last visit. Progressing very well, Goals ongoing    OBJECTIVE IMPAIRMENTS decreased activity tolerance, decreased endurance, decreased mobility, difficulty walking, decreased ROM, decreased strength, and pain.   ACTIVITY LIMITATIONS cleaning, community activity, occupation, and yard work.   PERSONAL FACTORS Fitness, Past/current experiences, and 3+ comorbidities: R TKR- 07/05/2021, depression, PTSD,migraine/ TIA (loss of sensation on L side), L ischemic stroke, T2DM, irregular heartbeat, gastroparesis  are also affecting patient's functional outcome.    REHAB POTENTIAL: Good  CLINICAL DECISION MAKING: Stable/uncomplicated  EVALUATION COMPLEXITY: Low   GOALS: Goals reviewed with patient? No  SHORT TERM GOALS: Target date: 05/01/2022  Pt will be compliant and knowledgeable with initial HEP for improved comfort and carryover Baseline: initial HEP given  Goal status: INITIAL  2.  Pt will self report R knee pain no greater than 5/10 for improved comfort and functional ability Baseline: 7/10 at worst Goal status: INITIAL  LONG TERM GOALS: Target date: 06/05/2022  Pt will self report R knee pain no  greater than 2/10 for improved comfort and functional ability Baseline: 7/10 at worst Goal status: INITIAL  2.  Pt will improve FOTO function score to no less than 55% as proxy for functional  improvement Baseline: 47% function Goal status: INITIAL  3.  Pt will be able to ambulate up/down 15 stairs with reciprocal gait and no increase in knee pain for improved comfort and functional mobility Baseline: unable  Goal status: INITIAL  4.  Pt will improve all R LE strength to no less than 4+/5 for improved mobility and decreased pain Baseline: see chart Goal status: INITIAL  PLAN: PT FREQUENCY: 1-2x/week  PT DURATION: 8 weeks  PLANNED INTERVENTIONS: Therapeutic exercises, Therapeutic activity, Neuromuscular re-education, Balance training, Gait training, Patient/Family education, Joint mobilization, Aquatic Therapy, Dry Needling, Electrical stimulation, and Manual therapy  PLAN FOR NEXT SESSION:  LE/core strengthening and stretching; stairs as able   Stanton Kidney Tharon Aquas) Dalores Weger MPT 04/10/22 10:12 AM

## 2022-04-13 ENCOUNTER — Ambulatory Visit: Payer: BC Managed Care – PPO

## 2022-04-13 NOTE — Therapy (Incomplete)
OUTPATIENT PHYSICAL THERAPY LOWER EXTREMITY TREATMENT   Patient Name: Miranda [name] MRN: [id number] DOB:[birth year], 46 y.o., female Today's Date: 04/13/2022        Past Medical History:  Diagnosis Date   Anginal pain ([place])    Anxiety    Arthritis    Asthma    Bowel obstruction ([place])    Cardiac arrhythmia    Depression    sees Dr. Matilde Haymaker in [place]    Diabetes mellitus    sees Dr. Madelin Rear    Dyspnea    Dysrhythmia    GERD (gastroesophageal reflux disease)    Hyperlipidemia    Hypertension    Hypothyroidism    sees Dr. Elyse Hsu   Insomnia    Migraine syndrome    se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 08/2017   Stroke ([place]) 03/25/2016   left MCA    Stroke [facility]) 04/2016   [facility]    Past Surgical History:  Procedure Laterality Date   blocked itestinal repair  1975   age 68 months   BREAST BIOPSY Left 2017   CHOLECYSTECTOMY  2011   INDUCED ABORTION  1996   forced abortion   INGUINAL HERNIA REPAIR Left 1980   age 12   KNEE CLOSED REDUCTION Right 08/22/2021   Procedure: RIGHT  KNEE MANIPULATION UNDER ANESTHESIA;  Surgeon: Meredith Pel, MD;  Location: [place];  Service: Orthopedics;  Laterality: Right;   LAPAROSCOPIC ENDOMETRIOSIS FULGURATION  1998   TOTAL KNEE ARTHROPLASTY Right 07/05/2021   Procedure: TOTAL KNEE ARTHROPLASTY;  Surgeon: Meredith Pel, MD;  Location: [place];  Service: Orthopedics;  Laterality: Right;   WISDOM TOOTH EXTRACTION  2007   Patient Active Problem List   Diagnosis Date Noted   Arthrofibrosis of knee joint, right    Arthritis of right knee    Diabetic gastroparesis ([place]) 07/20/2021   Constipation 07/20/2021   S/P total knee  arthroplasty, right 07/05/2021   Chronic diarrhea 04/29/2021   Belching 04/29/2021   Primary osteoarthritis of both knees 10/28/2020   Irritable bowel syndrome with diarrhea 10/16/2019   Tendinitis of right rotator cuff 09/17/2019   Hand pain, right 05/21/2019   Labral tear of hip joint 05/21/2019   Type 2 diabetes mellitus with diabetic neuropathy, unspecified ([place]) 10/01/2018   Right hip pain 05/22/2018   Tennis Must Quervain's disease (radial styloid tenosynovitis) 04/02/2018   Carpal tunnel syndrome, right upper limb 12/31/2017   Elbow injury, right, initial encounter 12/16/2017   Chronic pain of both shoulders 11/30/2017   PTSD (post-traumatic stress disorder) 10/25/2016   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[place]) 10/25/2016   Diabetes mellitus ([place]) 10/25/2016   Major depressive disorder, recurrent episode, severe, with psychosis ([place]) 10/20/2016   Major depressive disorder, recurrent episode, severe, 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[place]) 10/19/2016   TIA (transient ischemic attack) 05/16/2016   Arterial ischemic stroke, MCA, left, acute ([place]) 03/29/2016   Plantar fasciitis 08/08/2013   Obesity, Class III, BMI 40-49.9 (morbid obesity) ([place]) 04/09/2013   Unspecified sleep apnea 03/31/2013   PCO (polycystic ovaries) 04/11/2012   Neck pain 11/21/2010   FATTY LIVER DISEASE 01/26/2010   NAUSEA WITH VOMITING 01/26/2010   PORTAL HYPERTENSION  01/26/2010   Migraine headache 06/28/2009   BREAST MASS 08/21/2008   KNEE PAIN 04/14/2008   Hypothyroidism 03/17/2008   Hyperlipidemia 03/17/2008   DEPRESSION 03/17/2008   NEUROPATHY 03/17/2008   Essential hypertension 03/17/2008   ASTHMA 03/17/2008   GERD 03/17/2008   Headache 03/17/2008    REFERRING PROVIDER:  Vonita Moss, NP  REFERRING DIAG:  pain due to total knee replacement  THERAPY DIAG:  No diagnosis found.  ONSET DATE: Chronic  SUBJECTIVE:   SUBJECTIVE STATEMENT: ***   PERTINENT HISTORY: R TKR- 07/05/2021, depression,  PTSD,migraine/ TIA (loss of sensation on L side), L ischemic stroke, T2DM, irregular heartbeat, gastroparesis  PAIN:  Are you having pain? Yes NPRS scale: 2-3/10 Pain location:right knee Pain description: sharp,  Aggravating factors: sitting  Relieving factors: rolling on side  PRECAUTIONS: None  WEIGHT BEARING RESTRICTIONS No  FALLS:  Has patient fallen in last 6 months? Yes. 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  PLOF: Independent and Independent with basic ADLs  PATIENT GOALS: decrease pain in R knee in order to improve walking and general mobility    OBJECTIVE:   DIAGNOSTIC FINDINGS:   N/A  PATIENT SURVEYS:  FOTO 47% function; 55% predicted  COGNITION:  Overall cognitive status: Within functional limits for tasks assessed     SENSATION: Decreased light touch to R lateral LE  POSTURE:  Medium body habitus, well healed scar to R knee  PALPATION: TTP to medial R knee joint line  LE ROM:  Active ROM Right 04/13/2022 Left 04/13/2022  Hip flexion    Hip extension    Hip abduction    Hip adduction    Hip internal rotation    Hip external rotation    Knee flexion 110   Knee extension 0   Ankle dorsiflexion    Ankle plantarflexion    Ankle inversion    Ankle eversion     (Blank rows = not tested)  LE MMT:  MMT Right 04/13/2022 Left 04/13/2022  Hip flexion 3+/5 4+/5  Hip extension    Hip abduction 3+/5 3+/5  Hip adduction    Hip internal rotation    Hip external rotation    Knee flexion 4/5 4+/5  Knee extension 4+/5 5/5  Ankle dorsiflexion    Ankle plantarflexion    Ankle inversion    Ankle eversion     (Blank rows = not tested)  LOWER EXTREMITY SPECIAL  TESTS:  N/A  FUNCTIONAL TESTS:  30 Second Sit to Stand: 13 reps  GAIT: Distance walked: 13f Assistive device utilized: None Level of assistance: Complete Independence Comments: R antalgic gait  TODAY'S TREATMENT:   [facility] Adult PT Treatment:                                                DATE: 03/30/2022 Pt seen for aquatic therapy today.  Treatment took place in water 3.25-4.8 ft [facility] Temp of water was 91.  Pt entered/exited the pool via stairs step through pattern independently with bilat rail.  Walking forward, back without UE support Squats  with long pause at bottom to stretch knees x 10 on 1st step Backward/ forward monster walk x 3 widths  Hurdle walk x 3 widths Forward step ups with retro step downs R/L (2 steps with rail/ x 7 reps ) Backward lung x10 Forward lunge x 10 Straddling blue thick squatre noodle: cycling; skiing; add/abd - with blue hand buoys - 3 cycles through circuit 3 way leg stretch with LE supported by noodle:  ITB, hamstring, adductor stretch x 15-20 sec each position x 2 each  Quad/hip flex stretch R/L supported on wall R/L 3x20s hold  Viscosity of the water is needed for resistance of strengthening; water current perturbations provides challenge to standing balance unsupported, requiring increased core activation.     PATIENT EDUCATION:  Education details: progression of exercise; DOMS expectation  Person educated: Patient Education method: Explanation, Demonstration, and Handouts Education comprehension: verbalized understanding and returned demonstration   HOME EXERCISE PROGRAM: Access Code: [email or web address] URL: [email or web address] Date: 02/23/2022 Prepared by: Octavio Manns  Exercises - Active Straight Leg Raise with Quad Set  - 1 x daily - 7 x weekly - 3 sets - 10 reps - Hooklying Clamshell with Resistance  - 1 x daily - 7 x weekly - 3 sets - 15 reps - Standing Terminal Knee Extension at Wall  with Ball  - 1 x daily - 7 x weekly - 3 sets - 10 reps - 5 sec hold  ASSESSMENT:  CLINICAL IMPRESSION: ***    OBJECTIVE IMPAIRMENTS decreased activity tolerance, decreased endurance, decreased mobility, difficulty walking, decreased ROM, decreased strength, and pain.   ACTIVITY LIMITATIONS cleaning, community activity, occupation, and yard work.   PERSONAL FACTORS Fitness, Past/current experiences, and 3+ comorbidities: R TKR- 07/05/2021, depression, PTSD,migraine/ TIA (loss of sensation on L side), L ischemic stroke, T2DM, irregular heartbeat, gastroparesis  are also affecting patient's functional outcome.    REHAB POTENTIAL: Good  CLINICAL DECISION MAKING: Stable/uncomplicated  EVALUATION COMPLEXITY: Low   GOALS: Goals reviewed with patient? No  SHORT TERM GOALS: Target date: 05/04/2022  Pt will be compliant and knowledgeable with initial HEP for improved comfort and carryover Baseline: initial HEP given  Goal status: INITIAL  2.  Pt will self report R knee pain no greater than 5/10 for improved comfort and functional ability Baseline: 7/10 at worst Goal status: INITIAL  LONG TERM GOALS: Target date: 06/08/2022  Pt will self report R knee pain no greater than 2/10 for improved comfort and functional ability Baseline: 7/10 at worst Goal status: INITIAL  2.  Pt will improve FOTO function score to no less than 55% as proxy for functional improvement Baseline: 47% function Goal status: INITIAL  3.  Pt will be able to ambulate up/down 15 stairs with reciprocal gait and no increase in knee pain for improved comfort and functional mobility Baseline: unable  Goal status: INITIAL  4.  Pt will improve all R LE strength to no less than 4+/5 for improved mobility and decreased pain Baseline: see chart Goal status: INITIAL  PLAN: PT FREQUENCY: 1-2x/week  PT DURATION: 8 weeks  PLANNED INTERVENTIONS: Therapeutic exercises, Therapeutic activity, Neuromuscular re-education,  Balance training, Gait training, Patient/Family education, Joint mobilization, Aquatic Therapy, Dry Needling, Electrical stimulation, and Manual therapy  PLAN FOR NEXT SESSION:  LE/core strengthening and stretching; stairs as able   Stanton Kidney Tharon Aquas) [place] MPT 04/13/22 2:41 PM

## 2022-04-19 ENCOUNTER — Ambulatory Visit: Payer: BC Managed Care – PPO | Admitting: Orthopaedic Surgery

## 2022-04-19 ENCOUNTER — Encounter (HOSPITAL_BASED_OUTPATIENT_CLINIC_OR_DEPARTMENT_OTHER): Payer: Self-pay | Admitting: Physical Therapy

## 2022-04-19 ENCOUNTER — Ambulatory Visit (HOSPITAL_BASED_OUTPATIENT_CLINIC_OR_DEPARTMENT_OTHER): Payer: BC Managed Care – PPO | Admitting: Physical Therapy

## 2022-04-19 DIAGNOSIS — M6281 Muscle weakness (generalized): Secondary | ICD-10-CM

## 2022-04-19 DIAGNOSIS — R2689 Other abnormalities of gait and mobility: Secondary | ICD-10-CM

## 2022-04-19 DIAGNOSIS — G8929 Other chronic pain: Secondary | ICD-10-CM

## 2022-04-19 NOTE — Therapy (Signed)
OUTPATIENT PHYSICAL THERAPY LOWER EXTREMITY TREATMENT   Patient Name: Miranda [name] MRN: [id number] DOB:November 20, 1975, 46 y.o., female Today's Date: 04/19/2022   PT End of Session - 04/19/22 0820     Visit Number 14    Number of Visits 17    Date for PT Re-Evaluation 04/21/22    Authorization Type BCBS    PT Start Time 0820    PT Stop Time 0900    PT Time Calculation (min) 40 min    Activity Tolerance Patient tolerated treatment well    Behavior During Therapy [facility] for tasks assessed/performed                Past Medical History:  Diagnosis Date   Anginal pain ([place])    Anxiety    Arthritis    Asthma    Bowel obstruction ([place])    Cardiac arrhythmia    Depression    sees Dr. Matilde Haymaker in [place]    Diabetes mellitus    sees Dr. Madelin Rear    Dyspnea    Dysrhythmia    GERD (gastroesophageal reflux disease)    Hyperlipidemia    Hypertension    Hypothyroidism    sees Dr. Elyse Hsu   Insomnia    Migraine syndrome    se[facility] Dr. 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 08/2017   Stroke ([place]) 03/25/2016   left MCA    Stroke [facility]) 04/2016   sees Dr. Teodora Medici at [facility]    Past Surgical History:  Procedure Laterality Date   blocked itestinal repair  1975   age 78 months   BREAST BIOPSY Left 2017   CHOLECYSTECTOMY  2011   INDUCED ABORTION  1996   forced abortion   INGUINAL HERNIA REPAIR Left 1980   age 73   KNEE CLOSED REDUCTION Right 08/22/2021   Procedure: RIGHT  KNEE MANIPULATION UNDER ANESTHESIA;  Surgeon: Meredith Pel, MD;  Location: [place];  Service: Orthopedics;  Laterality: Right;   LAPAROSCOPIC ENDOMETRIOSIS FULGURATION  1998   TOTAL KNEE ARTHROPLASTY Right 07/05/2021   Procedure: TOTAL  KNEE ARTHROPLASTY;  Surgeon: Meredith Pel, MD;  Location: [place];  Service: Orthopedics;  Laterality: Right;   WISDOM TOOTH EXTRACTION  2007   Patient Active Problem List   Diagnosis Date Noted   Arthrofibrosis of knee joint, right    Arthritis of right knee    Diabetic gastroparesis ([place]) 07/20/2021   Constipation 07/20/2021   S/P total knee arthroplasty, right 07/05/2021   Chronic diarrhea 04/29/2021   Belching 04/29/2021   Primary osteoarthritis of both knees 10/28/2020   Irritable bowel syndrome with diarrhea 10/16/2019   Tendinitis of right rotator cuff 09/17/2019   Hand pain, right 05/21/2019   Labral tear of hip joint 05/21/2019   Type 2 diabetes mellitus with diabetic neuropathy, unspecified ([place]) 10/01/2018   Right hip pain 05/22/2018   Tennis Must Quervain's disease (radial styloid tenosynovitis) 04/02/2018   Carpal tunnel syndrome, right upper limb 12/31/2017   Elbow injury, right, initial encounter 12/16/2017   Chronic pain of both shoulders 11/30/2017   PTSD (post-traumatic stress disorder) 10/25/2016   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[place]) 10/25/2016   Diabetes mellitus ([place]) 10/25/2016   Major depressive  disorder, recurrent episode, severe, with psychosis ([place]) 10/20/2016   Major depressive disorder, recurrent episode, severe, 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[place]) 10/19/2016   TIA (transient ischemic attack) 05/16/2016   Arterial ischemic stroke, MCA, left, acute ([place]) 03/29/2016   Plantar fasciitis 08/08/2013   Obesity, Class III, BMI 40-49.9 (morbid obesity) ([place]) 04/09/2013   Unspecified sleep apnea 03/31/2013   PCO (polycystic ovaries) 04/11/2012   Neck pain 11/21/2010   FATTY LIVER DISEASE 01/26/2010   NAUSEA WITH VOMITING 01/26/2010   PORTAL HYPERTENSION 01/26/2010   Migraine headache 06/28/2009   BREAST MASS 08/21/2008   KNEE PAIN 04/14/2008   Hypothyroidism 03/17/2008   Hyperlipidemia 03/17/2008   DEPRESSION 03/17/2008   NEUROPATHY 03/17/2008   Essential  hypertension 03/17/2008   ASTHMA 03/17/2008   GERD 03/17/2008   Headache 03/17/2008    REFERRING PROVIDER:  Vonita Moss, NP  REFERRING DIAG:  pain due to total knee replacement  THERAPY DIAG:  Muscle weakness (generalized)  Other abnormalities of gait and mobility  Chronic pain of right knee  ONSET DATE: Chronic  SUBJECTIVE:   SUBJECTIVE STATEMENT: "Knee hurting but I have been off of my ibuprofen last 3 days due to being out of famotidine.  Will get more today . Burning is better.  Md are checking it out"   PERTINENT HISTORY: R TKR- 07/05/2021, depression, PTSD,migraine/ TIA (loss of sensation on L side), L ischemic stroke, T2DM, irregular heartbeat, gastroparesis  PAIN:  Are you having pain? Yes NPRS scale:6/10 Pain location:right knee Pain description: sharp,  Aggravating factors: sitting  Relieving factors: rolling on side  PRECAUTIONS: None  WEIGHT BEARING RESTRICTIONS No  FALLS:  Has patient fallen in last 6 months? Yes. 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  PLOF: Independent and Independent with basic ADLs  PATIENT GOALS: decrease pain in R knee in order to improve walking and general mobility    OBJECTIVE:   DIAGNOSTIC FINDINGS:   N/A  PATIENT SURVEYS:  FOTO 47% function; 55% predicted  COGNITION:  Overall cognitive status: Within functional limits for tasks assessed     SENSATION: Decreased light touch to R lateral LE  POSTURE:  Medium body habitus, well healed scar to R knee  PALPATION: TTP to medial R knee joint line  LE ROM:  Active ROM Right 04/19/2022 Left 04/19/2022  Hip flexion    Hip extension    Hip abduction    Hip adduction     Hip internal rotation    Hip external rotation    Knee flexion 110   Knee extension 0   Ankle dorsiflexion    Ankle plantarflexion    Ankle inversion    Ankle eversion     (Blank rows = not tested)  LE MMT:  MMT Right 04/19/2022 Left 04/19/2022  Hip flexion 3+/5 4+/5  Hip extension    Hip abduction 3+/5 3+/5  Hip adduction    Hip internal rotation    Hip external rotation    Knee flexion 4/5 4+/5  Knee extension 4+/5 5/5  Ankle dorsiflexion    Ankle plantarflexion    Ankle inversion    Ankle eversion     (Blank rows = not tested)  LOWER EXTREMITY SPECIAL TESTS:  N/A  FUNCTIONAL TESTS:  30 Second Sit to Stand: 13 reps  GAIT: Distance walked: 32f Assistive device utilized: None Level of assistance: Complete Independence Comments: R antalgic gait  TODAY'S TREATMENT:   [facility] Adult PT Treatment:                                                DATE: 03/30/2022 Pt seen for aquatic therapy today.  Treatment took place in water 3.25-4.8 ft [facility] Temp of water was 91.  Pt entered/exited the pool via stairs step through pattern independently with bilat rail.  Walking forward, back without UE support Seated cycle,flutter and add/abd x20 ea 3 trials Standing runners stretch gastroc and hamstring, then forward lunge quad stretch Squats with long pause at bottom to stretch knees x 10 in 335f  Backward lunge x10 Forward lunge x 10 Straddling blue thick squatre noodle: cycling; skiing; add/abd - with blue hand buoys - 3 cycles through circuit   Viscosity of the water is needed for resistance of strengthening; water current perturbations provides challenge to standing balance unsupported, requiring increased core activation.     PATIENT EDUCATION:  Education details: progression of exercise; DOMS expectation  Person educated: Patient Education method: Explanation, Demonstration, and Handouts Education comprehension: verbalized  understanding and returned demonstration   HOME EXERCISE PROGRAM: Access Code: [email or web address]: [email or web address] Date: 02/23/2022 Prepared by: DaOctavio MannsExercises - Active Straight Leg Raise with Quad Set  - 1 x daily - 7 x weekly - 3 sets - 10 reps - Hooklying Clamshell with Resistance  - 1 x daily - 7 x weekly - 3 sets - 15 reps - Standing Terminal Knee Extension at Wall with Ball  - 1 x daily - 7 x weekly - 3 sets - 10 reps - 5 sec hold  ASSESSMENT:  CLINICAL IMPRESSION: Increase in right knee pain with decreased use of anti inflammatory.  Pt edu on use of anti-inflammatories with suggestions on how to taper doses.  Antalgic gait on land appreciated decreased once submerged. Cues given for proper gait. Pt edu also on continued management of condition.She complains of tightness in calf as well as knee. Focused on stretching and hip/knee mobility. R knee flex hindered slightly as compared to last visit. Overall she has progressed well with aquatic therapy.  She is indep with taping and understands management of condition.     OBJECTIVE IMPAIRMENTS decreased activity tolerance, decreased endurance, decreased mobility, difficulty walking, decreased ROM, decreased strength, and pain.   ACTIVITY LIMITATIONS cleaning, community activity, occupation, and yard work.   PERSONAL FACTORS Fitness, Past/current experiences, and 3+ comorbidities: R TKR- 07/05/2021, depression, PTSD,migraine/ TIA (loss of sensation on L side), L ischemic stroke, T2DM, irregular heartbeat, gastroparesis  are also affecting patient's functional outcome.    REHAB POTENTIAL: Good  CLINICAL DECISION MAKING: Stable/uncomplicated  EVALUATION COMPLEXITY: Low   GOALS: Goals reviewed with patient? No  SHORT TERM GOALS: Target date: 05/10/2022  Pt will be compliant and knowledgeable with initial HEP for improved comfort and carryover Baseline: initial HEP given  Goal status: INITIAL  2.  Pt will  self report R knee pain no greater than 5/10 for improved comfort and functional ability Baseline: 7/10 at worst Goal status: INITIAL  LONG TERM GOALS: Target date: 06/14/2022  Pt will self report R knee pain no greater than 2/10 for  improved comfort and functional ability Baseline: 7/10 at worst Goal status: INITIAL  2.  Pt will improve FOTO function score to no less than 55% as proxy for functional improvement Baseline: 47% function Goal status: INITIAL  3.  Pt will be able to ambulate up/down 15 stairs with reciprocal gait and no increase in knee pain for improved comfort and functional mobility Baseline: unable  Goal status: INITIAL  4.  Pt will improve all R LE strength to no less than 4+/5 for improved mobility and decreased pain Baseline: see chart Goal status: INITIAL  PLAN: PT FREQUENCY: 1-2x/week  PT DURATION: 8 weeks  PLANNED INTERVENTIONS: Therapeutic exercises, Therapeutic activity, Neuromuscular re-education, Balance training, Gait training, Patient/Family education, Joint mobilization, Aquatic Therapy, Dry Needling, Electrical stimulation, and Manual therapy  PLAN FOR NEXT SESSION:  LE/core strengthening and stretching; stairs as able   Stanton Kidney Tharon Aquas) Elliott Lasecki MPT 04/19/22 12:23 PM

## 2022-04-27 ENCOUNTER — Ambulatory Visit: Payer: BC Managed Care – PPO | Attending: Family Medicine

## 2022-04-27 DIAGNOSIS — M6281 Muscle weakness (generalized): Secondary | ICD-10-CM | POA: Insufficient documentation

## 2022-04-27 DIAGNOSIS — R2689 Other abnormalities of gait and mobility: Secondary | ICD-10-CM | POA: Insufficient documentation

## 2022-04-27 NOTE — Therapy (Signed)
OUTPATIENT PHYSICAL THERAPY LOWER EXTREMITY TREATMENT   Patient Name: Miranda [name] MRN: [id number] DOB:[birth year], 45 y.o., female Today's Date: 04/27/2022   PT End of Session - 04/27/22 1655     Visit Number 15    Number of Visits 17    Date for PT Re-Evaluation 06/08/22    Authorization Type BCBS    PT Start Time 1700    PT Stop Time 1735    PT Time Calculation (min) 35 min    Activity Tolerance Patient tolerated treatment well    Behavior During Therapy WFL for tasks assessed/performed                 Past Medical History:  Diagnosis Date   Anginal pain ([place])    Anxiety    Arthritis    Asthma    Bowel obstruction ([place])    Cardiac arrhythmia    Depression    sees Dr. Matilde Haymaker in [place]    Diabetes mellitus    sees Dr. Madelin Rear    Dyspnea    Dysrhythmia    GERD (gastroesophageal reflux disease)    Hyperlipidemia    Hypertension    Hypothyroidism    sees Dr. Elyse Hsu   Insomnia    Migraine syndrome    se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 08/2017   Stroke ([place]) 03/25/2016   left MCA    Stroke [facility]) 04/2016   se[facility] Teodora Medici at [facility]    Past Surgical History:  Procedure Laterality Date   blocked itestinal repair  1975   age 67 months   BREAST BIOPSY Left 2017   CHOLECYSTECTOMY  2011   INDUCED ABORTION  1996   forced abortion   INGUINAL HERNIA REPAIR Left 1980   age 35   KNEE CLOSED REDUCTION Right 08/22/2021   Procedure: RIGHT  KNEE MANIPULATION UNDER ANESTHESIA;  Surgeon: Meredith Pel, MD;  Location: [place];  Service: Orthopedics;  Laterality: Right;   LAPAROSCOPIC ENDOMETRIOSIS FULGURATION  1998   TOTAL KNEE ARTHROPLASTY Right 07/05/2021   Procedure: TOTAL  KNEE ARTHROPLASTY;  Surgeon: Meredith Pel, MD;  Location: [place];  Service: Orthopedics;  Laterality: Right;   WISDOM TOOTH EXTRACTION  2007   Patient Active Problem List   Diagnosis Date Noted   Arthrofibrosis of knee joint, right    Arthritis of right knee    Diabetic gastroparesis ([place]) 07/20/2021   Constipation 07/20/2021   S/P total knee arthroplasty, right 07/05/2021   Chronic diarrhea 04/29/2021   Belching 04/29/2021   Primary osteoarthritis of both knees 10/28/2020   Irritable bowel syndrome with diarrhea 10/16/2019   Tendinitis of right rotator cuff 09/17/2019   Hand pain, right 05/21/2019   Labral tear of hip joint 05/21/2019   Type 2 diabetes mellitus with diabetic neuropathy, unspecified ([name]) 10/01/2018   Right hip pain 05/22/2018   Tennis Must Quervain's disease (radial styloid tenosynovitis) 04/02/2018   Carpal tunnel syndrome, right upper limb 12/31/2017   Elbow injury, right, initial encounter 12/16/2017   Chronic pain of both shoulders 11/30/2017   PTSD (post-traumatic stress disorder) 10/25/2016   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[place]) 10/25/2016   Diabetes mellitus ([place]) 10/25/2016   Major  depressive disorder, recurrent episode, severe, with psychosis ([place]) 10/20/2016   Major depressive disorder, recurrent episode, severe, 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[place]) 10/19/2016   TIA (transient ischemic attack) 05/16/2016   Arterial ischemic stroke, MCA, left, acute ([place]) 03/29/2016   Plantar fasciitis 08/08/2013   Obesity, Class III, BMI 40-49.9 (morbid obesity) ([place]) 04/09/2013   Unspecified sleep apnea 03/31/2013   PCO (polycystic ovaries) 04/11/2012   Neck pain 11/21/2010   FATTY LIVER DISEASE 01/26/2010   NAUSEA WITH VOMITING 01/26/2010   PORTAL HYPERTENSION 01/26/2010   Migraine headache 06/28/2009   BREAST MASS 08/21/2008   KNEE PAIN 04/14/2008   Hypothyroidism 03/17/2008   Hyperlipidemia 03/17/2008   DEPRESSION 03/17/2008   NEUROPATHY 03/17/2008   Essential  hypertension 03/17/2008   ASTHMA 03/17/2008   GERD 03/17/2008   Headache 03/17/2008    REFERRING PROVIDER:  Vonita Moss, NP  REFERRING DIAG:  pain due to total knee replacement  THERAPY DIAG:  Muscle weakness (generalized) - Plan: PT plan of care cert/re-cert  Other abnormalities of gait and mobility - Plan: PT plan of care cert/re-cert  ONSET DATE: Chronic  SUBJECTIVE:   SUBJECTIVE STATEMENT: Pt presents to PT with continued reports of R knee pain. Has been doing well with aquatic therapy, notes that she had been doing well in aquatics and has been progressing well. She had stopped her gabapentin last for a bit and had increased pain because of this. Pt is ready to begin PT at this time.    PERTINENT HISTORY: R TKR- 07/05/2021, depression, PTSD,migraine/ TIA (loss of sensation on L side), L ischemic stroke, T2DM, irregular heartbeat, gastroparesis  PAIN:  Are you having pain? Yes NPRS scale: 3/10 Pain location:right knee Pain description: sharp,  Aggravating factors: sitting  Relieving factors: rolling on side  PRECAUTIONS: None  WEIGHT BEARING RESTRICTIONS No  FALLS:  Has patient fallen in last 6 months? Yes. 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  PLOF: Independent and Independent with basic ADLs  PATIENT GOALS: decrease pain in R knee in order to improve walking and general mobility    OBJECTIVE:   DIAGNOSTIC FINDINGS:   N/A  PATIENT SURVEYS:  FOTO: 52% function - 04/27/2022  COGNITION:  Overall cognitive status: Within functional limits for tasks assessed     SENSATION: Decreased light touch to R lateral LE  POSTURE:  Medium body habitus, well healed scar  to R knee  PALPATION: TTP to medial R knee joint line  LE ROM:  Active ROM Right 04/27/2022 Left 04/27/2022  Hip flexion    Hip extension    Hip abduction    Hip adduction    Hip internal rotation    Hip external rotation    Knee flexion 110   Knee extension 0   Ankle dorsiflexion    Ankle plantarflexion    Ankle inversion    Ankle eversion     (Blank rows = not tested)  LE MMT:  MMT Right 04/27/2022 Left 04/27/2022  Hip flexion 4/5 5/5  Hip extension    Hip abduction 5/5 5/5  Hip adduction    Hip internal rotation    Hip external rotation    Knee flexion 4/5 4+/5  Knee extension 5/5 5/5  Ankle dorsiflexion  Ankle plantarflexion    Ankle inversion    Ankle eversion     (Blank rows = not tested)  LOWER EXTREMITY SPECIAL TESTS:  N/A  FUNCTIONAL TESTS:  30 Second Sit to Stand: 13 reps  GAIT: Distance walked: 42f Assistive device utilized: None Level of assistance: Complete Independence Comments: R antalgic gait  TODAY'S TREATMENT: [facility] Adult PT Treatment:                                                DATE: 04/27/2022 Therapeutic Exercise: Eccentric heel tap 2x10 R Supine SLR 2x10 2.5# S/L clamshell 2x15 GTB  Bridge 2x10 S/L hip abd 2x10  Therapeutic Activity: Assessment of tests/measures, goals, and outcomes  PATIENT EDUCATION:  Education details: progression of exercise; DOMS expectation  Person educated: Patient Education method: Explanation, Demonstration, and Handouts Education comprehension: verbalized understanding and returned demonstration   HOME EXERCISE PROGRAM: Access Code: [email or web address]: [email or web address] Date: 04/27/2022 Prepared by: DOctavio Manns Exercises - Active Straight Leg Raise with Quad Set  - 3-4 x weekly - 3 sets - 10 reps - Clam with Resistance  - 3-4 x weekly - 2-3 sets - 15 reps - Forward Step Down Touch with Heel  - 3-4 x weekly - 2-3 sets - 10 reps - Supine Bridge with Resistance Band  - 3-4 x  weekly - 3 sets - 10 reps - Sidelying Hip Abduction  - 1 x daily - 7 x weekly - 2-3 sets - 10 reps  ASSESSMENT:  CLINICAL IMPRESSION: Pt was able to complete all prescribed exercises with no adverse effect or increase in pain. Over the course of PT treatment pt has progressed well, showing improved LE strength and slightly decreased R knee pain. Her FOTO score has slightly improved since initial evaluation and she notes that she is moving better overall. She continues to have pain in R knee with stairs and community mobility. She would continue to benefit from skilled PT working on eccentric quad strength and general functional mobility. Will continue to progress as able per POC.  OBJECTIVE IMPAIRMENTS decreased activity tolerance, decreased endurance, decreased mobility, difficulty walking, decreased ROM, decreased strength, and pain.   ACTIVITY LIMITATIONS cleaning, community activity, occupation, and yard work.   PERSONAL FACTORS Fitness, Past/current experiences, and 3+ comorbidities: R TKR- 07/05/2021, depression, PTSD,migraine/ TIA (loss of sensation on L side), L ischemic stroke, T2DM, irregular heartbeat, gastroparesis  are also affecting patient's functional outcome.    GOALS: Goals reviewed with patient? No  SHORT TERM GOALS: Target date: 05/18/2022  Pt will be compliant and knowledgeable with initial HEP for improved comfort and carryover Baseline: initial HEP given  Goal status: MET  2.  Pt will self report R knee pain no greater than 5/10 for improved comfort and functional ability Baseline: 7/10 at worst Goal status: ONGOING  LONG TERM GOALS: Target date: 06/08/2022  Pt will self report R knee pain no greater than 2/10 for improved comfort and functional ability Baseline: 7/10 at worst Goal status: ONGOING  2.  Pt will improve FOTO function score to no less than 55% as proxy for functional improvement Baseline: 47% function 04/27/2022: 52% function Goal status:  ONGOING  3.  Pt will be able to ambulate up/down 15 stairs with reciprocal gait and no increase in knee pain for improved comfort and functional mobility Baseline: unable  04/27/2022: pain  with descending on R Goal status: PARTIALLY MET   4.  Pt will improve all R LE strength to no less than 4+/5 for improved mobility and decreased pain Baseline: see chart Goal status: ONGOING  PLAN: PT FREQUENCY: 1-2x/week  PT DURATION: 6 weeks  PLANNED INTERVENTIONS: Therapeutic exercises, Therapeutic activity, Neuromuscular re-education, Balance training, Gait training, Patient/Family education, Joint mobilization, Aquatic Therapy, Dry Needling, Electrical stimulation, and Manual therapy  PLAN FOR NEXT SESSION:  LE/core strengthening and stretching; stairs as able  Ward Chatters PT  04/27/22 5:38 PM

## 2022-05-02 NOTE — Therapy (Addendum)
OUTPATIENT PHYSICAL THERAPY LOWER EXTREMITY TREATMENT/DISCHARGE NOTE PHYSICAL THERAPY DISCHARGE SUMMARY  Visits from Start of Care: 16  Current functional level related to goals / functional outcomes: unknown   Remaining deficits: unknown   Education / Equipment: HEP   Patient agrees to discharge. Patient goals were partially met. Patient is being discharged due to not returning since the last visit.  Miranda [name], PT, [place] Certified Exercise Expert for the Aging Adult  06/13/22 4:31 PM Phone: [phone] Fax: [phone]     Patient Name: Miranda [name] MRN: [id number] DOB:1976-07-14, 46 y.o., female Today's Date: 05/03/2022   PT End of Session - 05/03/22 1238     Visit Number 16    Number of Visits 17    Date for PT Re-Evaluation 06/08/22    Authorization Type BCBS    Authorization - Number of Visits 22    PT Start Time 1230    PT Stop Time 0130    PT Time Calculation (min) 780 min    Activity Tolerance Patient tolerated treatment well    Behavior During Therapy [facility] for tasks assessed/performed                  Past Medical History:  Diagnosis Date   Anginal pain ([place])    Anxiety    Arthritis    Asthma    Bowel obstruction ([place])    Cardiac arrhythmia    Depression    sees Dr. Matilde Haymaker in [place]    Diabetes mellitus    sees Dr. Madelin Rear    Dyspnea    Dysrhythmia    GERD (gastroesophageal reflux disease)    Hyperlipidemia    Hypertension    Hypothyroidism    sees Dr. Elyse Hsu   Insomnia    Migraine syndrome    sees Dr. 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 08/2017   Stroke ([place]) 03/25/2016   left MCA    Stroke [facility]) 04/2016   se[facility] Dr. Teodora Medici at [facility]    Past Surgical History:   Procedure Laterality Date   blocked itestinal repair  1975   age 55 months   BREAST BIOPSY Left 2017   CHOLECYSTECTOMY  2011   INDUCED ABORTION  1996   forced abortion   INGUINAL HERNIA REPAIR Left 1980   age 49   KNEE CLOSED REDUCTION Right 08/22/2021   Procedure: RIGHT  KNEE MANIPULATION UNDER ANESTHESIA;  Surgeon: Meredith Pel, MD;  Location: [place];  Service: Orthopedics;  Laterality: Right;   LAPAROSCOPIC ENDOMETRIOSIS FULGURATION  1998   TOTAL KNEE ARTHROPLASTY Right 07/05/2021   Procedure: TOTAL KNEE ARTHROPLASTY;  Surgeon: Meredith Pel, MD;  Location: [place];  Service: Orthopedics;  Laterality: Right;   WISDOM TOOTH EXTRACTION  2007   Patient Active Problem List   Diagnosis Date Noted   Arthrofibrosis of knee joint, right    Arthritis of right knee    Diabetic gastroparesis ([name]) 07/20/2021   Constipation 07/20/2021   S/P total knee arthroplasty, right 07/05/2021   Chronic diarrhea 04/29/2021   Belching 04/29/2021   Primary osteoarthritis of both knees 10/28/2020   Irritable bowel syndrome with diarrhea 10/16/2019   Tendinitis of right rotator cuff 09/17/2019  Hand pain, right 05/21/2019   Labral tear of hip joint 05/21/2019   Type 2 diabetes mellitus with diabetic neuropathy, unspecified ([place]) 10/01/2018   Right hip pain 05/22/2018   Miranda [name] disease (radial styloid tenosynovitis) 04/02/2018   Carpal tunnel syndrome, right upper limb 12/31/2017   Elbow injury, right, initial encounter 12/16/2017   Chronic pain of both shoulders 11/30/2017   PTSD (post-traumatic stress disorder) 10/25/2016   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[place]) 10/25/2016   Diabetes mellitus ([name]) 10/25/2016   Major depressive disorder, recurrent episode, severe, with psychosis ([place]) 10/20/2016   Major depressive disorder, recurrent episode, severe, 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[place]) 10/19/2016   TIA (transient ischemic attack) 05/16/2016   Arterial ischemic stroke,  MCA, left, acute ([place]) 03/29/2016   Plantar fasciitis 08/08/2013   Obesity, Class III, BMI 40-49.9 (morbid obesity) ([place]) 04/09/2013   Unspecified sleep apnea 03/31/2013   PCO (polycystic ovaries) 04/11/2012   Neck pain 11/21/2010   FATTY LIVER DISEASE 01/26/2010   NAUSEA WITH VOMITING 01/26/2010   PORTAL HYPERTENSION 01/26/2010   Migraine headache 06/28/2009   BREAST MASS 08/21/2008   KNEE PAIN 04/14/2008   Hypothyroidism 03/17/2008   Hyperlipidemia 03/17/2008   DEPRESSION 03/17/2008   NEUROPATHY 03/17/2008   Essential hypertension 03/17/2008   ASTHMA 03/17/2008   GERD 03/17/2008   Headache 03/17/2008    REFERRING PROVIDER:  Vonita Moss, NP  REFERRING DIAG:  pain due to total knee replacement  THERAPY DIAG:  Muscle weakness (generalized)  Other abnormalities of gait and mobility  Chronic pain of right knee  Chronic pain of left knee  Acute pain of right knee  Stiffness of right knee, not elsewhere classified  Localized edema  ONSET DATE: Chronic  SUBJECTIVE:   SUBJECTIVE STATEMENT: Pt presents to aquatics with slight antalgic gait on R knee over lateral knee joint and along gastroc/calf area proximally/laterally. I am taking gabapentin orally. My pain today is 4/10   PERTINENT HISTORY: R TKR- 07/05/2021, depression, PTSD,migraine/ TIA (loss of sensation on L side), L ischemic stroke, T2DM, irregular heartbeat, gastroparesis  PAIN:  Are you having pain? Yes NPRS scale: 4/10 Pain location:right  lateral knee Pain description: sharp,  Aggravating factors: sitting  Relieving factors: rolling on side  PRECAUTIONS: None  WEIGHT BEARING RESTRICTIONS No  FALLS:  Has patient fallen in last 6 months? Yes. 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  PLOF: Independent and Independent with basic ADLs  PATIENT GOALS: decrease pain in R knee in order to improve walking and general mobility    OBJECTIVE:   DIAGNOSTIC FINDINGS:   N/A  PATIENT SURVEYS:  FOTO: 52% function - 04/27/2022  COGNITION:  Overall cognitive status: Within functional limits for tasks assessed     SENSATION: Decreased light touch to R lateral LE  POSTURE:  Medium body habitus, well healed scar to R knee  PALPATION: TTP to medial R knee joint line  LE ROM:  Active ROM Right 05/03/2022 Left 05/03/2022  Hip flexion    Hip extension    Hip abduction    Hip adduction    Hip internal rotation    Hip external rotation    Knee flexion 110   Knee extension 0   Ankle dorsiflexion  Ankle plantarflexion    Ankle inversion    Ankle eversion     (Blank rows = not tested)  LE MMT:  MMT Right 04/27/2022 Left 04/27/2022  Hip flexion 4/5 5/5  Hip extension    Hip abduction 5/5 5/5  Hip adduction    Hip internal rotation    Hip external rotation    Knee flexion 4/5 4+/5  Knee extension 5/5 5/5  Ankle dorsiflexion    Ankle plantarflexion    Ankle inversion    Ankle eversion     (Blank rows = not tested)  LOWER EXTREMITY SPECIAL TESTS:  N/A  FUNCTIONAL TESTS:  30 Second Sit to Stand: 13 reps  GAIT: Distance walked: 74f Assistive device utilized: None Level of assistance: Complete Independence Comments: R antalgic gait  TODAY'S TREATMENT: [facility] Adult PT Treatment:                                                DATE: 05-03-22 Pt seen for aquatic therapy today.  Treatment took place in water 3.25-4.8 ft in [facility] Temp of water was 91.  Pt entered/exited the pool via stairs step through pattern independently with bilat rail. Pt enters clinic with slight R antalgic gait and no AD. 4/10 lateral knee pain on R   Walking forward, back  without UE support R gastroc stretch 3 x 30 sec with great toe on pool wall and hips forward to pt tolerance Heel raise and toe raise bil x 20 Aqua stretch over R lateral gastroc and peroneal mx Ascend/descend 5 steps. Alternating steps up and R knee on step on step down one at time 5 rounds.  Pt with descent 4/10 Walking lunge across pool 4 lengths of pool. Runners stretch x30" BIL including hamstring and hip flexor, knee flexion Bulgarian squat with R foot  on submerged bench 10 squats to stretch quad IT band next to pool wall with R foot behind L 2 x 30 to 60 sec Seated cycle,flutter and add/abd x20 to 30 sec each ea 3 trials Squats x 20 Backward lunge x10 Straddling blue thick squatre noodle: cycling; skiing; add/abd - with yellow hand buoys - 3 cycles through circuit     Viscosity of the water is needed for resistance of strengthening; water current perturbations provides challenge to standing balance unsupported, requiring increased core activation.   [facility] Treatment:                                                DATE: 04/27/2022 Therapeutic Exercise: Eccentric heel tap 2x10 R Supine SLR 2x10 2.5# S/L clamshell 2x15 GTB  Bridge 2x10 S/L hip abd 2x10  Therapeutic Activity: Assessment of tests/measures, goals, and outcomes  PATIENT EDUCATION:  Education details: progression of exercise; DOMS expectation  Person educated: Patient Education method: Explanation, Demonstration, and Handouts Education comprehension: verbalized understanding and returned demonstration   HOME EXERCISE PROGRAM: Access Code: [email or web address]: [email or web address] Date: 04/27/2022 Prepared by: DOctavio Manns Exercises - Active Straight Leg Raise with Quad Set  - 3-4 x weekly - 3 sets - 10 reps - Clam with Resistance  - 3-4 x weekly - 2-3 sets - 15 reps - Forward Step Down  Touch with Heel  - 3-4 x weekly - 2-3 sets - 10 reps - Supine Bridge with Resistance Band  - 3-4 x weekly  - 3 sets - 10 reps - Sidelying Hip Abduction  - 1 x daily - 7 x weekly - 2-3 sets - 10 reps  ASSESSMENT:  CLINICAL IMPRESSION: Ms Nolting came to aquatics with 4/10 pain but was able to reduce to 2/10 after aquatics and aqua stretch. Pt with tight R IT band and pain over lateral R knee/gastroc.  Pt responded well to aquastretch well today.  Pt was able to perform all exercises given.  Pt with initial discomfort with Bulgarian squat but then has 2/10 pain after exercise.  Pt continues to have knee pain with descending on steps. At end of clinic time solid 3/10. Pt would benefit from continuing to stretch IT band and strengthening in clinic.     OBJECTIVE IMPAIRMENTS decreased activity tolerance, decreased endurance, decreased mobility, difficulty walking, decreased ROM, decreased strength, and pain.   ACTIVITY LIMITATIONS cleaning, community activity, occupation, and yard work.   PERSONAL FACTORS Fitness, Past/current experiences, and 3+ comorbidities: R TKR- 07/05/2021, depression, PTSD,migraine/ TIA (loss of sensation on L side), L ischemic stroke, T2DM, irregular heartbeat, gastroparesis  are also affecting patient's functional outcome.    GOALS: Goals reviewed with patient? No  SHORT TERM GOALS: Target date: 05/18/2022  Pt will be compliant and knowledgeable with initial HEP for improved comfort and carryover Baseline: initial HEP given  Goal status: MET  2.  Pt will self report R knee pain no greater than 5/10 for improved comfort and functional ability Baseline: 7/10 at worst Goal status: ONGOING  LONG TERM GOALS: Target date: 06/08/2022  Pt will self report R knee pain no greater than 2/10 for improved comfort and functional ability Baseline: 7/10 at worst Goal status: ONGOING  2.  Pt will improve FOTO function score to no less than 55% as proxy for functional improvement Baseline: 47% function 04/27/2022: 52% function Goal status: ONGOING  3.  Pt will be able to ambulate  up/down 15 stairs with reciprocal gait and no increase in knee pain for improved comfort and functional mobility Baseline: unable  04/27/2022: pain with descending on R Goal status: PARTIALLY MET   4.  Pt will improve all R LE strength to no less than 4+/5 for improved mobility and decreased pain Baseline: see chart Goal status: ONGOING  PLAN: PT FREQUENCY: 1-2x/week  PT DURATION: 6 weeks  PLANNED INTERVENTIONS: Therapeutic exercises, Therapeutic activity, Neuromuscular re-education, Balance training, Gait training, Patient/Family education, Joint mobilization, Aquatic Therapy, Dry Needling, Electrical stimulation, and Manual therapy  PLAN FOR NEXT SESSION:  LE/core strengthening and stretching; stairs as able Miranda [name], PT, [place] Certified Exercise Expert for the Aging Adult  05/03/22 1:28 PM Phone: [phone] Fax: [phone]

## 2022-05-03 ENCOUNTER — Ambulatory Visit: Payer: Self-pay | Attending: Family Medicine | Admitting: Physical Therapy

## 2022-05-03 ENCOUNTER — Encounter: Payer: Self-pay | Admitting: Physical Therapy

## 2022-05-03 DIAGNOSIS — R600 Localized edema: Secondary | ICD-10-CM | POA: Insufficient documentation

## 2022-05-03 DIAGNOSIS — R2689 Other abnormalities of gait and mobility: Secondary | ICD-10-CM | POA: Insufficient documentation

## 2022-05-03 DIAGNOSIS — M25661 Stiffness of right knee, not elsewhere classified: Secondary | ICD-10-CM | POA: Insufficient documentation

## 2022-05-03 DIAGNOSIS — M25561 Pain in right knee: Secondary | ICD-10-CM | POA: Insufficient documentation

## 2022-05-03 DIAGNOSIS — M6281 Muscle weakness (generalized): Secondary | ICD-10-CM | POA: Insufficient documentation

## 2022-05-03 DIAGNOSIS — G8929 Other chronic pain: Secondary | ICD-10-CM | POA: Insufficient documentation

## 2022-05-03 DIAGNOSIS — M25562 Pain in left knee: Secondary | ICD-10-CM | POA: Insufficient documentation

## 2022-05-09 ENCOUNTER — Ambulatory Visit: Payer: Self-pay

## 2022-05-10 ENCOUNTER — Ambulatory Visit: Payer: BC Managed Care – PPO

## 2022-05-11 ENCOUNTER — Ambulatory Visit (HOSPITAL_BASED_OUTPATIENT_CLINIC_OR_DEPARTMENT_OTHER): Payer: Self-pay | Admitting: Physical Therapy

## 2022-05-11 ENCOUNTER — Ambulatory Visit: Payer: Self-pay

## 2022-05-11 ENCOUNTER — Ambulatory Visit: Payer: BC Managed Care – PPO

## 2022-05-16 ENCOUNTER — Ambulatory Visit (HOSPITAL_BASED_OUTPATIENT_CLINIC_OR_DEPARTMENT_OTHER): Payer: Self-pay | Admitting: Physical Therapy

## 2022-05-16 ENCOUNTER — Ambulatory Visit: Payer: Self-pay

## 2022-05-18 ENCOUNTER — Ambulatory Visit: Payer: Self-pay

## 2022-05-19 ENCOUNTER — Ambulatory Visit: Payer: BC Managed Care – PPO

## 2022-05-23 ENCOUNTER — Telehealth: Payer: Self-pay

## 2022-05-23 ENCOUNTER — Ambulatory Visit: Payer: Self-pay

## 2022-05-23 NOTE — Telephone Encounter (Signed)
Contacted patient due to [employer] being inactive, she stated that she does have active insurance. She also stated that her Flex Spending Acct is not currently active and she doesn't know when it will be active. She stated that she will call on Monday to cancel if the Flex spending is still inactive.

## 2022-05-26 ENCOUNTER — Ambulatory Visit: Payer: Self-pay

## 2022-05-26 ENCOUNTER — Encounter (HOSPITAL_BASED_OUTPATIENT_CLINIC_OR_DEPARTMENT_OTHER): Payer: Self-pay

## 2022-05-30 ENCOUNTER — Ambulatory Visit: Payer: Self-pay

## 2022-06-02 ENCOUNTER — Ambulatory Visit (HOSPITAL_BASED_OUTPATIENT_CLINIC_OR_DEPARTMENT_OTHER): Payer: Self-pay | Admitting: Physical Therapy

## 2022-06-07 ENCOUNTER — Other Ambulatory Visit: Payer: Self-pay | Admitting: Family Medicine

## 2022-06-09 ENCOUNTER — Ambulatory Visit (HOSPITAL_BASED_OUTPATIENT_CLINIC_OR_DEPARTMENT_OTHER): Payer: Self-pay | Admitting: Physical Therapy

## 2022-07-05 ENCOUNTER — Ambulatory Visit: Payer: BC Managed Care – PPO | Admitting: Surgical

## 2022-07-05 ENCOUNTER — Encounter: Payer: Self-pay | Admitting: Orthopedic Surgery

## 2022-07-05 DIAGNOSIS — Z96651 Presence of right artificial knee joint: Secondary | ICD-10-CM

## 2022-07-05 NOTE — Progress Notes (Signed)
Office Visit Note   Patient: Miranda [name]           Date of Birth: 15-Dec-1975           MRN: [id number] Visit Date: 07/05/2022 Requested by: Vonita Moss, NP [address] [place],  [place] [address] PCP: Vonita Moss, NP  Subjective: Chief Complaint  Patient presents with   Right Knee - Follow-up    right total knee arthroplasty on 07/05/2021 with subsequent right knee manipulation under anesthesia on 08/22/2021    HPI: Miranda [name] is a 46 y.o. female who presents to the office complaining of right knee pain.  Patient is s/p right total knee arthroplasty on 07/05/2021.  Patient is here for 1 year follow-up.  States that her right knee is overall doing well with no buckling or signs/symptoms of infection.  Does have occasional inferior lateral pain.  Pain never wakes her up at night from the right knee.  Clicking mechanical sensation from her last visit has resolved.  Takes Motrin 800 mg daily mostly for her left knee pain.  She has left knee arthritis but is not bothering her enough for any intervention.  Cortisone injections in the past have given her severe cortisone flare symptoms.  Does not really have much pain in the right knee unless she overdoes it in the amount of physical activity she does..                ROS: All systems reviewed are negative as they relate to the chief complaint within the history of present illness.  Patient denies fevers or chills.  Assessment & Plan: Visit Diagnoses:  1. Status post right knee replacement     Plan: Patient is a 46 year old female who returns for evaluation of right knee following right total knee arthroplasty on 07/05/2021.  Mechanical clicking sensation from last visit has completely resolved.  She has some occasional mild to moderate pain based on her activity level but overall she feels satisfied with how her right knee is doing.  Left knee arthritis is not bothering her enough for any intervention.  Counseled her on  antibiotic dental prophylaxis.  She will follow-up with the office as needed.  Follow-Up Instructions: No follow-ups on file.   Orders:  No orders of the defined types were placed in this encounter.  No orders of the defined types were placed in this encounter.     Procedures: No procedures performed   Clinical Data: No additional findings.  Objective: Vital Signs: There were no vitals taken for this visit.  Physical Exam:  Constitutional: Patient appears well-developed HEENT:  Head: Normocephalic Eyes:EOM are normal Neck: Normal range of motion Cardiovascular: Normal rate Pulmonary/chest: Effort normal Neurologic: Patient is alert Skin: Skin is warm Psychiatric: Patient has normal mood and affect  Ortho Exam: Ortho exam demonstrates left knee with 0 degrees extension and 120 degrees knee flexion.  No effusion in either knee.  Right knee with 0 degrees extension 105 degrees of knee flexion.  Incision is well-healed from prior surgery.  No warmth overlying the right knee.  No calf tenderness.  Negative Homans' sign.  She has excellent quad strength rated 5/5.  Stable to varus valgus stress at 0 and 30 degrees in the right knee.  Patient ambulates without any significant antalgia.  Specialty Comments:  No specialty comments available.  Imaging: No results found.   PMFS History: Patient Active Problem List   Diagnosis Date Noted   Arthrofibrosis of knee  joint, right    Arthritis of right knee    Diabetic gastroparesis ([place]) 07/20/2021   Constipation 07/20/2021   S/P total knee arthroplasty, right 07/05/2021   Chronic diarrhea 04/29/2021   Belching 04/29/2021   Primary osteoarthritis of both knees 10/28/2020   Irritable bowel syndrome with diarrhea 10/16/2019   Tendinitis of right rotator cuff 09/17/2019   Hand pain, right 05/21/2019   Labral tear of hip joint 05/21/2019   Type 2 diabetes mellitus with diabetic neuropathy, unspecified ([place]) 10/01/2018   Right hip  pain 05/22/2018   Harriet Pho disease (radial styloid tenosynovitis) 04/02/2018   Carpal tunnel syndrome, right upper limb 12/31/2017   Elbow injury, right, initial encounter 12/16/2017   Chronic pain of both shoulders 11/30/2017   PTSD (post-traumatic stress disorder) 10/25/2016   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   Diabetes mellitus ([place]) 10/25/2016   Major depressive disorder, recurrent episode, severe, with psychosis ([place]) 10/20/2016   Major depressive disorder, recurrent episode, severe, 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[place]) 10/19/2016   TIA (transient ischemic attack) 05/16/2016   Arterial ischemic stroke, MCA, left, acute ([place]) 03/29/2016   Plantar fasciitis 08/08/2013   Obesity, Class III, BMI 40-49.9 (morbid obesity) ([place]) 04/09/2013   Unspecified sleep apnea 03/31/2013   PCO (polycystic ovaries) 04/11/2012   Neck pain 11/21/2010   FATTY LIVER DISEASE 01/26/2010   NAUSEA WITH VOMITING 01/26/2010   PORTAL HYPERTENSION 01/26/2010   Migraine headache 06/28/2009   BREAST MASS 08/21/2008   KNEE PAIN 04/14/2008   Hypothyroidism 03/17/2008   Hyperlipidemia 03/17/2008   DEPRESSION 03/17/2008   NEUROPATHY 03/17/2008   Essential hypertension 03/17/2008   ASTHMA 03/17/2008   GERD 03/17/2008   Headache 03/17/2008   Past Medical History:  Diagnosis Date   Anginal pain ([place])    Anxiety    Arthritis    Asthma    Bowel obstruction ([place])    Cardiac arrhythmia    Depression    sees Dr. Matilde Haymaker in [place]    Diabetes mellitus    sees Dr. Madelin Rear    Dyspnea    Dysrhythmia    GERD (gastroesophageal reflux disease)    Hyperlipidemia    Hypertension    Hypothyroidism    sees Dr. Elyse Hsu   Insomnia    Migraine syndrome    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 08/2017   Stroke ([place]) 03/25/2016   left MCA    Stroke [facility]) 04/2016   sees Dr. Teodora Medici at [place]  Adopted: Yes  Problem Relation Age of Onset   Heart disease Other        on both sides of family   Diabetes Other        on both sides of family    Past Surgical History:  Procedure Laterality Date   blocked itestinal repair  65   age 60 months   BREAST BIOPSY Left 2017   CHOLECYSTECTOMY  2011   INDUCED ABORTION  1996   forced abortion   INGUINAL HERNIA REPAIR Left 1980   age 66   KNEE CLOSED REDUCTION Right 08/22/2021   Procedure: RIGHT  KNEE MANIPULATION UNDER ANESTHESIA;  Surgeon: Meredith Pel, MD;  Location: [place];  Service: Orthopedics;  Laterality: Right;   LAPAROSCOPIC ENDOMETRIOSIS FULGURATION  1998   TOTAL KNEE ARTHROPLASTY Right 07/05/2021   Procedure: TOTAL KNEE ARTHROPLASTY;  Surgeon: Meredith Pel, MD;  Location: [place];  Service: Orthopedics;  Laterality: Right;   WISDOM TOOTH EXTRACTION  2007   Social History   Occupational History   Occupation: Nurse, learning disability  Tobacco Use   Smoking status: Every Day    Packs/day: 0.25    Years: 0.50    Total pack years: 0.13    Types: Cigarettes    Start date: 02/24/2018   Smokeless tobacco: Never   Tobacco comments:    has decreased how much - trying to quit  Vaping Use   Vaping Use: Former   Substances: CBD  Substance and Sexual Activity   Alcohol use: Yes    Comment: rarely   Drug use: Yes    Types: Marijuana    Comment: 2/10 last marijuana use   Sexual activity: Not on file

## 2022-08-02 ENCOUNTER — Telehealth: Payer: Self-pay | Admitting: Family Medicine

## 2022-08-02 ENCOUNTER — Ambulatory Visit: Payer: BC Managed Care – PPO | Admitting: Family Medicine

## 2022-08-02 NOTE — Telephone Encounter (Signed)
Submitted request for monovisc/orthovisc today. Uploaded last office note.

## 2022-08-07 ENCOUNTER — Other Ambulatory Visit: Payer: Self-pay | Admitting: *Deleted

## 2022-08-14 ENCOUNTER — Ambulatory Visit (INDEPENDENT_AMBULATORY_CARE_PROVIDER_SITE_OTHER): Payer: BC Managed Care – PPO | Admitting: Family Medicine

## 2022-08-14 DIAGNOSIS — M1712 Unilateral primary osteoarthritis, left knee: Secondary | ICD-10-CM | POA: Diagnosis not present

## 2022-08-14 MED ORDER — HYALURONAN 30 MG/2ML IX SOSY
30.0000 mg | PREFILLED_SYRINGE | Freq: Once | INTRA_ARTICULAR | Status: AC
  Administered 2022-08-14: 30 mg via INTRA_ARTICULAR

## 2022-08-15 ENCOUNTER — Encounter: Payer: Self-pay | Admitting: Family Medicine

## 2022-08-15 NOTE — Progress Notes (Signed)
PCP: Vonita Moss, NP  Subjective:   HPI: Patient is a 46 y.o. female here for left knee orthovisc injection.  Patient with known osteoarthritis of her left knee. Right knee replaced last year though still some issues, not as bad as left knee. Interested in repeating viscosupplementation series.  Past Medical History:  Diagnosis Date   Anginal pain ([place])    Anxiety    Arthritis    Asthma    Bowel obstruction ([place])    Cardiac arrhythmia    Depression    sees Dr. Matilde Haymaker in [place]    Diabetes mellitus    sees Dr. Madelin Rear    Dyspnea    Dysrhythmia    GERD (gastroesophageal reflux disease)    Hyperlipidemia    Hypertension    Hypothyroidism    sees Dr. Elyse Hsu   Insomnia    Migraine syndrome    sees Dr. 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 08/2017   Stroke ([place]) 03/25/2016   left MCA    Stroke ([place]) 04/2016   sees Dr. Teodora Medici at [facility]     Current Outpatient Medications on File Prior to Visit  Medication Sig Dispense Refill   acetaminophen (TYLENOL) 325 MG tablet Take 2 tablets (650 mg total) by mouth every 6 (six) hours as needed for mild pain (pain score 1-3 or temp > 100.5). 30 tablet 0   albuterol (PROAIR HFA) 108 (90 Base) MCG/ACT inhaler Inhale 2 puffs into the lungs every 4 (four) hours as needed for wheezing. 8.5 g 11   ARIPiprazole (ABILIFY) 30 MG tablet Take 30 mg by mouth every evening.     baclofen (LIORESAL) 10 MG tablet Take 10 mg by mouth every 4 (four) hours as needed (headaches). (Patient not taking: Reported on 11/09/2021)     budesonide-formoterol (SYMBICORT) 160-4.5 MCG/ACT inhaler Inhale 2 puffs into the lungs 2 (two) times daily. (Patient taking differently: Inhale 2 puffs into the lungs 2 (two)  times daily as needed (asthma).) 1 Inhaler 11   buPROPion (WELLBUTRIN XL) 150 MG 24 hr tablet Take 150-300 mg by mouth See admin instructions. 300 mg in the morning, 150 mg in the evening     clopidogrel (PLAVIX) 75 MG tablet TAKE ONE TABLET BY MOUTH DAILY (Patient taking differently: Take 75 mg by mouth daily.) 90 tablet 0   diclofenac Sodium (VOLTAREN) 1 % GEL Apply 1 application topically 2 (two) times daily as needed (pain).     diphenoxylate-atropine (LOMOTIL) 2.5-0.025 MG tablet Take 1 tablet by mouth every 6 (six) hours as needed for diarrhea or loose stools. (Patient taking differently: Take 2 tablets by mouth every 6 (six) hours as needed for diarrhea or loose stools.) 30 tablet 0   docusate sodium (COLACE) 100 MG capsule Take 1 capsule (100 mg total) by mouth 2 (two) times daily. 10 capsule 0   famotidine (PEPCID) 40 MG tablet Take 40 mg by mouth 2 (two) times daily.     fluticasone (FLONASE) 50 MCG/ACT nasal spray Place 2 sprays into both nostrils daily as needed for allergies or rhinitis.     gabapentin (NEURONTIN) 400 MG capsule TAKE ONE CAPSULE BY MOUTH THREE TIMES A DAY 90 capsule 1  Glucagon (BAQSIMI ONE PACK) 3 MG/DOSE POWD Place 3 mg into the nose daily as needed (low blood sugar).     hydrOXYzine (VISTARIL) 50 MG capsule Take 50 mg by mouth 3 (three) times daily.     hyoscyamine (LEVSIN SL) 0.125 MG SL tablet Place 1 tablet (0.125 mg total) under the tongue every 6 (six) hours as needed. 30 tablet 1   ibuprofen (ADVIL) 200 MG tablet Take 800 mg by mouth in the morning and at bedtime.     insulin aspart (NOVOLOG) 100 UNIT/ML injection Use in the insulin pump as directed 10 mL 0   levothyroxine (SYNTHROID, LEVOTHROID) 125 MCG tablet Take 1 tablet (125 mcg total) by mouth daily before breakfast. 90 tablet 3   lisinopril (ZESTRIL) 10 MG tablet Take 10 mg by mouth daily.     loratadine (CLARITIN) 10 MG tablet Take 10 mg by mouth daily as needed for allergies.     Menthol, Topical  Analgesic, (BIOFREEZE) 10 % LIQD Apply 1 application topically 2 (two) times daily as needed (pain).     metoCLOPramide (REGLAN) 5 MG tablet Take 0.5 tablets (2.5 mg total) by mouth 4 (four) times daily -  before meals and at bedtime. 60 tablet 1   metoprolol succinate (TOPROL-XL) 25 MG 24 hr tablet Take 0.5 tablets (12.5 mg total) by mouth daily. (Patient taking differently: Take 25 mg by mouth daily.) 30 tablet 0   montelukast (SINGULAIR) 10 MG tablet Take 10 mg by mouth daily.     Multiple Vitamin (MULTIVITAMIN) tablet Take 1 tablet by mouth daily.     nystatin [facility]) powder Apply topically QID. (Patient taking differently: Apply 1 application topically 2 (two) times daily as needed (yeast/ dry skin).) 60 g 0   ondansetron (ZOFRAN-ODT) 4 MG disintegrating tablet Take 4 mg by mouth every 8 (eight) hours as needed for nausea or vomiting.     oxyCODONE (OXY IR/ROXICODONE) 5 MG immediate release tablet Take 1 tablet (5 mg total) by mouth every 6 (six) hours as needed for moderate pain (pain score 4-6). 30 tablet 0   OZEMPIC, 0.25 OR 0.5 MG/DOSE, 2 MG/1.5ML SOPN Inject 0.5 mg into the skin every Sunday.     pantoprazole (PROTONIX) 40 MG tablet Take 1 tablet (40 mg total) by mouth 2 (two) times daily. 180 tablet 2   polyethylene glycol powder (GLYCOLAX/MIRALAX) 17 GM/SCOOP powder Use 1 capful twice daily in 8 ounces of fluid 510 g 3   prazosin (MINIPRESS) 5 MG capsule Take 10 mg by mouth at bedtime.     promethazine (PHENERGAN) 25 MG tablet Take 1 tablet (25 mg total) by mouth every 4 (four) hours as needed for nausea or vomiting. (Patient taking differently: Take 25 mg by mouth every 6 (six) hours as needed for nausea or vomiting.) 60 tablet 1   Rimegepant Sulfate (NURTEC) 75 MG TBDP Take 75 mg by mouth daily as needed (migraines).     rosuvastatin (CRESTOR) 20 MG tablet Take 1 tablet (20 mg total) by mouth daily. 30 tablet 6   terconazole (TERAZOL 7) 0.4 % vaginal cream Place 1 applicator vaginally  at bedtime as needed (yeast infections).     topiramate (TOPAMAX) 100 MG tablet Take 100 mg by mouth 2 (two) times daily.      traZODone (DESYREL) 150 MG tablet Take 150 mg by mouth at bedtime.     Ubrogepant (UBRELVY) 100 MG TABS Take 100 mg by mouth daily as needed (migraines). May repeat in 2 hours as  needed. No more than 200 mg in 24 hours     valACYclovir (VALTREX) 1000 MG tablet Take 1 tablet (1,000 mg total) by mouth 3 (three) times daily. (Patient taking differently: Take 1,000 mg by mouth 2 (two) times daily as needed (breakouts).) 30 tablet 0   No current facility-administered medications on file prior to visit.    Past Surgical History:  Procedure Laterality Date   blocked itestinal repair  58   age 82 months   BREAST BIOPSY Left 2017   CHOLECYSTECTOMY  2011   INDUCED ABORTION  1996   forced abortion   INGUINAL HERNIA REPAIR Left 1980   age 56   KNEE CLOSED REDUCTION Right 08/22/2021   Procedure: RIGHT  KNEE MANIPULATION UNDER ANESTHESIA;  Surgeon: Meredith Pel, MD;  Location: [facility];  Service: Orthopedics;  Laterality: Right;   LAPAROSCOPIC ENDOMETRIOSIS FULGURATION  1998   TOTAL KNEE ARTHROPLASTY Right 07/05/2021   Procedure: TOTAL KNEE ARTHROPLASTY;  Surgeon: Meredith Pel, MD;  Location: [facility];  Service: Orthopedics;  Laterality: Right;   WISDOM TOOTH EXTRACTION  2007    Allergies  Allergen Reactions   Macrobid [Nitrofurantoin] Hives   Metformin And Related Other (See Comments)    Abdominal cramping    Byetta 10 Mcg Pen [Exenatide] Hives   Clindamycin/Lincomycin Nausea And Vomiting   Geodon [Ziprasidone Hcl]     Body starts shutting down   Lipitor [Atorvastatin]     Per patient this causes cramps   Nsaids     Heartburn   Penicillins Other (See Comments)    Childhood reaction Has patient had a PCN reaction causing immediate rash, facial/tongue/throat swelling, SOB or lightheadedness with hypotension: No Has patient had a PCN  reaction causing severe rash involving mucus membranes or skin necrosis: No Has patient had a PCN reaction that required hospitalization No Has patient had a PCN reaction occurring within the last 10 years: No If all of the above answers are "NO", then may proceed with Cephalosporin use.    Trulicity [Dulaglutide]     Abdominal pain    Avocado Diarrhea and Other (See Comments)    Severe cramping, sweats, and diarrhea in upper GI/stomach   Tramadol Hcl Itching and Rash    BP 109/70   Ht '5\' 2"'$  (1.575 m)   Wt 208 lb (94.3 kg)   BMI 38.04 kg/m       No data to display              No data to display              Objective:  Physical Exam:  Gen: NAD, comfortable in exam room  Left knee: No deformity, swelling, bruising. TTP medial joint line. NVI distally.   Assessment & Plan:  1. Left knee osteoarthritis - first orthovisc injection given today.  F/u in 1 week for second injection.  After informed written consent timeout was performed, patient was seated on exam table. Left knee was prepped with alcohol swab and utilizing anteromedial approach, patient's left knee was injected intraarticularly with 29m lidocaine followed by orthovisc. Patient tolerated the procedure well without immediate complications.

## 2022-08-21 ENCOUNTER — Ambulatory Visit (INDEPENDENT_AMBULATORY_CARE_PROVIDER_SITE_OTHER): Payer: BC Managed Care – PPO | Admitting: Family Medicine

## 2022-08-21 DIAGNOSIS — M1712 Unilateral primary osteoarthritis, left knee: Secondary | ICD-10-CM

## 2022-08-21 MED ORDER — HYALURONAN 30 MG/2ML IX SOSY
30.0000 mg | PREFILLED_SYRINGE | Freq: Once | INTRA_ARTICULAR | Status: AC
  Administered 2022-08-21: 30 mg via INTRA_ARTICULAR

## 2022-08-22 ENCOUNTER — Encounter: Payer: Self-pay | Admitting: Family Medicine

## 2022-08-22 NOTE — Progress Notes (Signed)
PCP: Vonita Moss, NP  Subjective:   HPI: Patient is a 46 y.o. female here for left knee orthovisc injection.  Patient returns for second orthovisc injection into left knee. Some improvement since last visit though sore right after shot.  Past Medical History:  Diagnosis Date   Anginal pain ([place])    Anxiety    Arthritis    Asthma    Bowel obstruction ([place])    Cardiac arrhythmia    Depression    sees Dr. Matilde Haymaker in [place]    Diabetes mellitus    sees Dr. Madelin Rear    Dyspnea    Dysrhythmia    GERD (gastroesophageal reflux disease)    Hyperlipidemia    Hypertension    Hypothyroidism    sees Dr. Elyse Hsu   Insomnia    Migraine syndrome    sees Dr. 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 Teodora Medici at [facility]     Current Outpatient Medications on File Prior to Visit  Medication Sig Dispense Refill   acetaminophen (TYLENOL) 325 MG tablet Take 2 tablets (650 mg total) by mouth every 6 (six) hours as needed for mild pain (pain score 1-3 or temp > 100.5). 30 tablet 0   albuterol (PROAIR HFA) 108 (90 Base) MCG/ACT inhaler Inhale 2 puffs into the lungs every 4 (four) hours as needed for wheezing. 8.5 g 11   ARIPiprazole (ABILIFY) 30 MG tablet Take 30 mg by mouth every evening.     baclofen (LIORESAL) 10 MG tablet Take 10 mg by mouth every 4 (four) hours as needed (headaches). (Patient not taking: Reported on 11/09/2021)     budesonide-formoterol (SYMBICORT) 160-4.5 MCG/ACT inhaler Inhale 2 puffs into the lungs 2 (two) times daily. (Patient taking differently: Inhale 2 puffs into the lungs 2 (two) times daily as needed (asthma).) 1 Inhaler 11   buPROPion  (WELLBUTRIN XL) 150 MG 24 hr tablet Take 150-300 mg by mouth See admin instructions. 300 mg in the morning, 150 mg in the evening     clopidogrel (PLAVIX) 75 MG tablet TAKE ONE TABLET BY MOUTH DAILY (Patient taking differently: Take 75 mg by mouth daily.) 90 tablet 0   diclofenac Sodium (VOLTAREN) 1 % GEL Apply 1 application topically 2 (two) times daily as needed (pain).     diphenoxylate-atropine (LOMOTIL) 2.5-0.025 MG tablet Take 1 tablet by mouth every 6 (six) hours as needed for diarrhea or loose stools. (Patient taking differently: Take 2 tablets by mouth every 6 (six) hours as needed for diarrhea or loose stools.) 30 tablet 0   docusate sodium (COLACE) 100 MG capsule Take 1 capsule (100 mg total) by mouth 2 (two) times daily. 10 capsule 0   famotidine (PEPCID) 40 MG tablet Take 40 mg by mouth 2 (two) times daily.     fluticasone (FLONASE) 50 MCG/ACT nasal spray Place 2 sprays into both nostrils daily as needed for allergies or rhinitis.     gabapentin (NEURONTIN) 400 MG capsule TAKE ONE CAPSULE BY MOUTH THREE TIMES A DAY 90 capsule 1   Glucagon (BAQSIMI ONE PACK) 3 MG/DOSE POWD Place 3  mg into the nose daily as needed (low blood sugar).     hydrOXYzine (VISTARIL) 50 MG capsule Take 50 mg by mouth 3 (three) times daily.     hyoscyamine (LEVSIN SL) 0.125 MG SL tablet Place 1 tablet (0.125 mg total) under the tongue every 6 (six) hours as needed. 30 tablet 1   ibuprofen (ADVIL) 200 MG tablet Take 800 mg by mouth in the morning and at bedtime.     insulin aspart (NOVOLOG) 100 UNIT/ML injection Use in the insulin pump as directed 10 mL 0   levothyroxine (SYNTHROID, LEVOTHROID) 125 MCG tablet Take 1 tablet (125 mcg total) by mouth daily before breakfast. 90 tablet 3   lisinopril (ZESTRIL) 10 MG tablet Take 10 mg by mouth daily.     loratadine (CLARITIN) 10 MG tablet Take 10 mg by mouth daily as needed for allergies.     Menthol, Topical Analgesic, (BIOFREEZE) 10 % LIQD Apply 1 application topically  2 (two) times daily as needed (pain).     metoCLOPramide (REGLAN) 5 MG tablet Take 0.5 tablets (2.5 mg total) by mouth 4 (four) times daily -  before meals and at bedtime. 60 tablet 1   metoprolol succinate (TOPROL-XL) 25 MG 24 hr tablet Take 0.5 tablets (12.5 mg total) by mouth daily. (Patient taking differently: Take 25 mg by mouth daily.) 30 tablet 0   montelukast (SINGULAIR) 10 MG tablet Take 10 mg by mouth daily.     Multiple Vitamin (MULTIVITAMIN) tablet Take 1 tablet by mouth daily.     nystatin [facility]) powder Apply topically QID. (Patient taking differently: Apply 1 application topically 2 (two) times daily as needed (yeast/ dry skin).) 60 g 0   ondansetron (ZOFRAN-ODT) 4 MG disintegrating tablet Take 4 mg by mouth every 8 (eight) hours as needed for nausea or vomiting.     oxyCODONE (OXY IR/ROXICODONE) 5 MG immediate release tablet Take 1 tablet (5 mg total) by mouth every 6 (six) hours as needed for moderate pain (pain score 4-6). 30 tablet 0   OZEMPIC, 0.25 OR 0.5 MG/DOSE, 2 MG/1.5ML SOPN Inject 0.5 mg into the skin every Sunday.     pantoprazole (PROTONIX) 40 MG tablet Take 1 tablet (40 mg total) by mouth 2 (two) times daily. 180 tablet 2   polyethylene glycol powder (GLYCOLAX/MIRALAX) 17 GM/SCOOP powder Use 1 capful twice daily in 8 ounces of fluid 510 g 3   prazosin (MINIPRESS) 5 MG capsule Take 10 mg by mouth at bedtime.     promethazine (PHENERGAN) 25 MG tablet Take 1 tablet (25 mg total) by mouth every 4 (four) hours as needed for nausea or vomiting. (Patient taking differently: Take 25 mg by mouth every 6 (six) hours as needed for nausea or vomiting.) 60 tablet 1   Rimegepant Sulfate (NURTEC) 75 MG TBDP Take 75 mg by mouth daily as needed (migraines).     rosuvastatin (CRESTOR) 20 MG tablet Take 1 tablet (20 mg total) by mouth daily. 30 tablet 6   terconazole (TERAZOL 7) 0.4 % vaginal cream Place 1 applicator vaginally at bedtime as needed (yeast infections).     topiramate  (TOPAMAX) 100 MG tablet Take 100 mg by mouth 2 (two) times daily.      traZODone (DESYREL) 150 MG tablet Take 150 mg by mouth at bedtime.     Ubrogepant (UBRELVY) 100 MG TABS Take 100 mg by mouth daily as needed (migraines). May repeat in 2 hours as needed. No more than 200 mg in 24 hours  valACYclovir (VALTREX) 1000 MG tablet Take 1 tablet (1,000 mg total) by mouth 3 (three) times daily. (Patient taking differently: Take 1,000 mg by mouth 2 (two) times daily as needed (breakouts).) 30 tablet 0   No current facility-administered medications on file prior to visit.    Past Surgical History:  Procedure Laterality Date   blocked itestinal repair  69   age 89 months   BREAST BIOPSY Left 2017   CHOLECYSTECTOMY  2011   INDUCED ABORTION  1996   forced abortion   INGUINAL HERNIA REPAIR Left 1980   age 74   KNEE CLOSED REDUCTION Right 08/22/2021   Procedure: RIGHT  KNEE MANIPULATION UNDER ANESTHESIA;  Surgeon: Meredith Pel, MD;  Location: [facility];  Service: Orthopedics;  Laterality: Right;   LAPAROSCOPIC ENDOMETRIOSIS FULGURATION  1998   TOTAL KNEE ARTHROPLASTY Right 07/05/2021   Procedure: TOTAL KNEE ARTHROPLASTY;  Surgeon: Meredith Pel, MD;  Location: [place];  Service: Orthopedics;  Laterality: Right;   WISDOM TOOTH EXTRACTION  2007    Allergies  Allergen Reactions   Macrobid [Nitrofurantoin] Hives   Metformin And Related Other (See Comments)    Abdominal cramping    Byetta 10 Mcg Pen [Exenatide] Hives   Clindamycin/Lincomycin Nausea And Vomiting   Geodon [Ziprasidone Hcl]     Body starts shutting down   Lipitor [Atorvastatin]     Per patient this causes cramps   Nsaids     Heartburn   Penicillins Other (See Comments)    Childhood reaction Has patient had a PCN reaction causing immediate rash, facial/tongue/throat swelling, SOB or lightheadedness with hypotension: No Has patient had a PCN reaction causing severe rash involving mucus membranes or skin  necrosis: No Has patient had a PCN reaction that required hospitalization No Has patient had a PCN reaction occurring within the last 10 years: No If all of the above answers are "NO", then may proceed with Cephalosporin use.    Trulicity [Dulaglutide]     Abdominal pain    Avocado Diarrhea and Other (See Comments)    Severe cramping, sweats, and diarrhea in upper GI/stomach   Tramadol Hcl Itching and Rash    BP 117/75   Ht '5\' 2"'$  (1.575 m)   BMI 38.04 kg/m       No data to display              No data to display              Objective:  Physical Exam:  Gen: NAD, comfortable in exam room   Assessment & Plan:  1. Left knee osteoarthritis - second orthovisc injection given today.  F/u in 1 week for third injection.  After informed written consent timeout was performed, patient was seated on exam table. Left knee was prepped with alcohol swab and utilizing anteromedial approach, patient's left knee was injected intraarticularly with 11m lidocaine followed by orthovisc. Patient tolerated the procedure well without immediate complications.

## 2022-08-28 ENCOUNTER — Ambulatory Visit: Payer: BC Managed Care – PPO | Admitting: Family Medicine

## 2022-08-28 ENCOUNTER — Encounter: Payer: Self-pay | Admitting: Family Medicine

## 2022-08-28 ENCOUNTER — Encounter (INDEPENDENT_AMBULATORY_CARE_PROVIDER_SITE_OTHER): Payer: Self-pay

## 2022-08-28 VITALS — BP 115/70 | Ht 61.75 in | Wt 203.7 lb

## 2022-08-28 DIAGNOSIS — M1712 Unilateral primary osteoarthritis, left knee: Secondary | ICD-10-CM

## 2022-08-28 MED ORDER — HYALURONAN 30 MG/2ML IX SOSY
30.0000 mg | PREFILLED_SYRINGE | Freq: Once | INTRA_ARTICULAR | Status: AC
  Administered 2022-08-28: 30 mg via INTRA_ARTICULAR

## 2022-08-28 NOTE — Progress Notes (Signed)
PCP: Vonita Moss, NP  Subjective:   HPI: Patient is a 46 y.o. female here for left knee orthovisc injection.  Patient here for third orthovisc injection. Overall left knee is improving though right is still a problem. Limited given her high deductible as far as doing more physical therapy.  Past Medical History:  Diagnosis Date   Anginal pain ([place])    Anxiety    Arthritis    Asthma    Bowel obstruction ([place])    Cardiac arrhythmia    Depression    sees Dr. Matilde Haymaker in [place]    Diabetes mellitus    sees Dr. Madelin Rear    Dyspnea    Dysrhythmia    GERD (gastroesophageal reflux disease)    Hyperlipidemia    Hypertension    Hypothyroidism    sees Dr. Elyse Hsu   Insomnia    Migraine syndrome    sees Dr. 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 Teodora Medici at [facility]     Current Outpatient Medications on File Prior to Visit  Medication Sig Dispense Refill   acetaminophen (TYLENOL) 325 MG tablet Take 2 tablets (650 mg total) by mouth every 6 (six) hours as needed for mild pain (pain score 1-3 or temp > 100.5). 30 tablet 0   albuterol (PROAIR HFA) 108 (90 Base) MCG/ACT inhaler Inhale 2 puffs into the lungs every 4 (four) hours as needed for wheezing. 8.5 g 11   ARIPiprazole (ABILIFY) 30 MG tablet Take 30 mg by mouth every evening.     baclofen (LIORESAL) 10 MG tablet Take 10 mg by mouth every 4 (four) hours as needed (headaches). (Patient not taking: Reported on 11/09/2021)     budesonide-formoterol (SYMBICORT) 160-4.5 MCG/ACT inhaler Inhale 2 puffs into the lungs 2 (two) times daily. (Patient taking differently: Inhale 2 puffs into the lungs 2 (two)  times daily as needed (asthma).) 1 Inhaler 11   buPROPion (WELLBUTRIN XL) 150 MG 24 hr tablet Take 150-300 mg by mouth See admin instructions. 300 mg in the morning, 150 mg in the evening     clopidogrel (PLAVIX) 75 MG tablet TAKE ONE TABLET BY MOUTH DAILY (Patient taking differently: Take 75 mg by mouth daily.) 90 tablet 0   diclofenac Sodium (VOLTAREN) 1 % GEL Apply 1 application topically 2 (two) times daily as needed (pain).     diphenoxylate-atropine (LOMOTIL) 2.5-0.025 MG tablet Take 1 tablet by mouth every 6 (six) hours as needed for diarrhea or loose stools. (Patient taking differently: Take 2 tablets by mouth every 6 (six) hours as needed for diarrhea or loose stools.) 30 tablet 0   docusate sodium (COLACE) 100 MG capsule Take 1 capsule (100 mg total) by mouth 2 (two) times daily. 10 capsule 0   famotidine (PEPCID) 40 MG tablet Take 40 mg by mouth 2 (two) times daily.     fluticasone (FLONASE) 50 MCG/ACT nasal spray Place 2 sprays into both nostrils daily as needed for allergies or rhinitis.     gabapentin (NEURONTIN) 400 MG capsule TAKE ONE CAPSULE BY MOUTH THREE TIMES A DAY 90 capsule 1  Glucagon (BAQSIMI ONE PACK) 3 MG/DOSE POWD Place 3 mg into the nose daily as needed (low blood sugar).     hydrOXYzine (VISTARIL) 50 MG capsule Take 50 mg by mouth 3 (three) times daily.     hyoscyamine (LEVSIN SL) 0.125 MG SL tablet Place 1 tablet (0.125 mg total) under the tongue every 6 (six) hours as needed. 30 tablet 1   ibuprofen (ADVIL) 200 MG tablet Take 800 mg by mouth in the morning and at bedtime.     insulin aspart (NOVOLOG) 100 UNIT/ML injection Use in the insulin pump as directed 10 mL 0   levothyroxine (SYNTHROID, LEVOTHROID) 125 MCG tablet Take 1 tablet (125 mcg total) by mouth daily before breakfast. 90 tablet 3   lisinopril (ZESTRIL) 10 MG tablet Take 10 mg by mouth daily.     loratadine (CLARITIN) 10 MG tablet Take 10 mg by mouth daily as needed for allergies.     Menthol, Topical  Analgesic, (BIOFREEZE) 10 % LIQD Apply 1 application topically 2 (two) times daily as needed (pain).     metoCLOPramide (REGLAN) 5 MG tablet Take 0.5 tablets (2.5 mg total) by mouth 4 (four) times daily -  before meals and at bedtime. 60 tablet 1   metoprolol succinate (TOPROL-XL) 25 MG 24 hr tablet Take 0.5 tablets (12.5 mg total) by mouth daily. (Patient taking differently: Take 25 mg by mouth daily.) 30 tablet 0   montelukast (SINGULAIR) 10 MG tablet Take 10 mg by mouth daily.     Multiple Vitamin (MULTIVITAMIN) tablet Take 1 tablet by mouth daily.     nystatin [facility]) powder Apply topically QID. (Patient taking differently: Apply 1 application topically 2 (two) times daily as needed (yeast/ dry skin).) 60 g 0   ondansetron (ZOFRAN-ODT) 4 MG disintegrating tablet Take 4 mg by mouth every 8 (eight) hours as needed for nausea or vomiting.     oxyCODONE (OXY IR/ROXICODONE) 5 MG immediate release tablet Take 1 tablet (5 mg total) by mouth every 6 (six) hours as needed for moderate pain (pain score 4-6). 30 tablet 0   OZEMPIC, 0.25 OR 0.5 MG/DOSE, 2 MG/1.5ML SOPN Inject 0.5 mg into the skin every Sunday.     pantoprazole (PROTONIX) 40 MG tablet Take 1 tablet (40 mg total) by mouth 2 (two) times daily. 180 tablet 2   polyethylene glycol powder (GLYCOLAX/MIRALAX) 17 GM/SCOOP powder Use 1 capful twice daily in 8 ounces of fluid 510 g 3   prazosin (MINIPRESS) 5 MG capsule Take 10 mg by mouth at bedtime.     promethazine (PHENERGAN) 25 MG tablet Take 1 tablet (25 mg total) by mouth every 4 (four) hours as needed for nausea or vomiting. (Patient taking differently: Take 25 mg by mouth every 6 (six) hours as needed for nausea or vomiting.) 60 tablet 1   Rimegepant Sulfate (NURTEC) 75 MG TBDP Take 75 mg by mouth daily as needed (migraines).     rosuvastatin (CRESTOR) 20 MG tablet Take 1 tablet (20 mg total) by mouth daily. 30 tablet 6   terconazole (TERAZOL 7) 0.4 % vaginal cream Place 1 applicator vaginally  at bedtime as needed (yeast infections).     topiramate (TOPAMAX) 100 MG tablet Take 100 mg by mouth 2 (two) times daily.      traZODone (DESYREL) 150 MG tablet Take 150 mg by mouth at bedtime.     Ubrogepant (UBRELVY) 100 MG TABS Take 100 mg by mouth daily as needed (migraines). May repeat in 2 hours as  needed. No more than 200 mg in 24 hours     valACYclovir (VALTREX) 1000 MG tablet Take 1 tablet (1,000 mg total) by mouth 3 (three) times daily. (Patient taking differently: Take 1,000 mg by mouth 2 (two) times daily as needed (breakouts).) 30 tablet 0   No current facility-administered medications on file prior to visit.    Past Surgical History:  Procedure Laterality Date   blocked itestinal repair  59   age 68 months   BREAST BIOPSY Left 2017   CHOLECYSTECTOMY  2011   INDUCED ABORTION  1996   forced abortion   INGUINAL HERNIA REPAIR Left 1980   age 70   KNEE CLOSED REDUCTION Right 08/22/2021   Procedure: RIGHT  KNEE MANIPULATION UNDER ANESTHESIA;  Surgeon: Meredith Pel, MD;  Location: [place];  Service: Orthopedics;  Laterality: Right;   LAPAROSCOPIC ENDOMETRIOSIS FULGURATION  1998   TOTAL KNEE ARTHROPLASTY Right 07/05/2021   Procedure: TOTAL KNEE ARTHROPLASTY;  Surgeon: Meredith Pel, MD;  Location: [place];  Service: Orthopedics;  Laterality: Right;   WISDOM TOOTH EXTRACTION  2007    Allergies  Allergen Reactions   Macrobid [Nitrofurantoin] Hives   Metformin And Related Other (See Comments)    Abdominal cramping    Byetta 10 Mcg Pen [Exenatide] Hives   Clindamycin/Lincomycin Nausea And Vomiting   Geodon [Ziprasidone Hcl]     Body starts shutting down   Lipitor [Atorvastatin]     Per patient this causes cramps   Nsaids     Heartburn   Penicillins Other (See Comments)    Childhood reaction Has patient had a PCN reaction causing immediate rash, facial/tongue/throat swelling, SOB or lightheadedness with hypotension: No Has patient had a PCN  reaction causing severe rash involving mucus membranes or skin necrosis: No Has patient had a PCN reaction that required hospitalization No Has patient had a PCN reaction occurring within the last 10 years: No If all of the above answers are "NO", then may proceed with Cephalosporin use.    Trulicity [Dulaglutide]     Abdominal pain    Avocado Diarrhea and Other (See Comments)    Severe cramping, sweats, and diarrhea in upper GI/stomach   Tramadol Hcl Itching and Rash    BP 115/70   Ht 5' 1.75" (1.568 m)   Wt 203 lb 11.2 oz (92.4 kg)   BMI 37.56 kg/m       No data to display              No data to display              Objective:  Physical Exam:  Gen: NAD, comfortable in exam room  Knee exam not repeated today.   Assessment & Plan:  1. Left knee osteoarthritis - third orthovisc injection given today.  Continue quad strengthening home exercises.  F/u prn.  After informed written consent timeout was performed, patient was seated on exam table. Left knee was prepped with alcohol swab and utilizing anteromedial approach, patient's left knee was injected intraarticularly with 52m lidocaine followed by orthovisc. Patient tolerated the procedure well without immediate complications.

## 2022-09-19 ENCOUNTER — Other Ambulatory Visit: Payer: Self-pay | Admitting: Family Medicine

## 2022-10-16 ENCOUNTER — Encounter: Payer: Self-pay | Admitting: Family Medicine

## 2022-10-16 ENCOUNTER — Other Ambulatory Visit: Payer: Self-pay | Admitting: Family Medicine

## 2022-10-18 ENCOUNTER — Other Ambulatory Visit: Payer: Self-pay | Admitting: Family Medicine

## 2022-10-31 ENCOUNTER — Other Ambulatory Visit: Payer: Self-pay | Admitting: Family Medicine

## 2023-02-12 ENCOUNTER — Encounter: Payer: Self-pay | Admitting: *Deleted

## 2023-04-11 ENCOUNTER — Ambulatory Visit (HOSPITAL_BASED_OUTPATIENT_CLINIC_OR_DEPARTMENT_OTHER): Payer: Self-pay | Admitting: Physical Therapy

## 2023-04-12 ENCOUNTER — Encounter (HOSPITAL_BASED_OUTPATIENT_CLINIC_OR_DEPARTMENT_OTHER): Payer: Self-pay | Admitting: Emergency Medicine

## 2023-04-12 ENCOUNTER — Emergency Department (HOSPITAL_BASED_OUTPATIENT_CLINIC_OR_DEPARTMENT_OTHER): Payer: BC Managed Care – PPO

## 2023-04-12 ENCOUNTER — Emergency Department (HOSPITAL_BASED_OUTPATIENT_CLINIC_OR_DEPARTMENT_OTHER)
Admission: EM | Admit: 2023-04-12 | Discharge: 2023-04-12 | Disposition: A | Discharge status: Home / Self Care | Payer: BC Managed Care – PPO | Attending: Emergency Medicine | Admitting: Emergency Medicine

## 2023-04-12 ENCOUNTER — Other Ambulatory Visit: Payer: Self-pay

## 2023-04-12 DIAGNOSIS — F17210 Nicotine dependence, cigarettes, uncomplicated: Secondary | ICD-10-CM | POA: Diagnosis not present

## 2023-04-12 DIAGNOSIS — Z79899 Other long term (current) drug therapy: Secondary | ICD-10-CM | POA: Insufficient documentation

## 2023-04-12 DIAGNOSIS — I10 Essential (primary) hypertension: Secondary | ICD-10-CM | POA: Insufficient documentation

## 2023-04-12 DIAGNOSIS — E039 Hypothyroidism, unspecified: Secondary | ICD-10-CM | POA: Insufficient documentation

## 2023-04-12 DIAGNOSIS — R1011 Right upper quadrant pain: Secondary | ICD-10-CM | POA: Insufficient documentation

## 2023-04-12 DIAGNOSIS — Z79890 Hormone replacement therapy (postmenopausal): Secondary | ICD-10-CM | POA: Insufficient documentation

## 2023-04-12 DIAGNOSIS — Z794 Long term (current) use of insulin: Secondary | ICD-10-CM | POA: Insufficient documentation

## 2023-04-12 DIAGNOSIS — E119 Type 2 diabetes mellitus without complications: Secondary | ICD-10-CM | POA: Insufficient documentation

## 2023-04-12 DIAGNOSIS — Z8673 Personal history of transient ischemic attack (TIA), and cerebral infarction without residual deficits: Secondary | ICD-10-CM | POA: Diagnosis not present

## 2023-04-12 DIAGNOSIS — Z7902 Long term (current) use of antithrombotics/antiplatelets: Secondary | ICD-10-CM | POA: Diagnosis not present

## 2023-04-12 DIAGNOSIS — K529 Noninfective gastroenteritis and colitis, unspecified: Secondary | ICD-10-CM

## 2023-04-12 LAB — COMPREHENSIVE METABOLIC PANEL
ALT: 21 U/L (ref 0–44)
AST: 18 U/L (ref 15–41)
Albumin: 3.9 g/dL (ref 3.5–5.0)
Alkaline Phosphatase: 53 U/L (ref 38–126)
Anion gap: 6 (ref 5–15)
BUN: 15 mg/dL (ref 6–20)
CO2: 24 mmol/L (ref 22–32)
Calcium: 9.1 mg/dL (ref 8.9–10.3)
Chloride: 106 mmol/L (ref 98–111)
Creatinine, Ser: 0.74 mg/dL (ref 0.44–1.00)
GFR, Estimated: >60 mL/min (ref >60)
Glucose, Bld: 104 mg/dL — ABNORMAL HIGH (ref 70–99)
Potassium: 3.2 mmol/L — ABNORMAL LOW (ref 3.5–5.1)
Sodium: 136 mmol/L (ref 135–145)
Total Bilirubin: 0.3 mg/dL (ref 0.3–1.2)
Total Protein: 6.9 g/dL (ref 6.5–8.1)

## 2023-04-12 LAB — CBC
HCT: 42.2 % (ref 36.0–46.0)
Hemoglobin: 14.7 g/dL (ref 12.0–15.0)
MCH: 31.1 pg (ref 26.0–34.0)
MCHC: 34.8 g/dL (ref 30.0–36.0)
MCV: 89.2 fL (ref 80.0–100.0)
Platelets: 179 10*3/uL (ref 150–400)
RBC: 4.73 MIL/uL (ref 3.87–5.11)
RDW: 12.8 % (ref 11.5–15.5)
WBC: 8.2 10*3/uL (ref 4.0–10.5)
nRBC: 0 % (ref 0.0–0.2)

## 2023-04-12 LAB — URINALYSIS, ROUTINE W REFLEX MICROSCOPIC
Bilirubin Urine: NEGATIVE
Glucose, UA: NEGATIVE mg/dL
Hgb urine dipstick: NEGATIVE
Ketones, ur: NEGATIVE mg/dL
Leukocytes,Ua: NEGATIVE
Nitrite: NEGATIVE
Protein, ur: NEGATIVE mg/dL
Specific Gravity, Urine: 1.01 (ref 1.005–1.030)
pH: 5.5 (ref 5.0–8.0)

## 2023-04-12 LAB — CBG MONITORING, ED: Glucose-Capillary: 93 mg/dL (ref 70–99)

## 2023-04-12 LAB — LIPASE, BLOOD: Lipase: 33 U/L (ref 11–51)

## 2023-04-12 LAB — PREGNANCY, URINE: Preg Test, Ur: NEGATIVE

## 2023-04-12 MED ORDER — ONDANSETRON 4 MG PO TBDP: 4.0000 mg | ORAL_TABLET | Freq: Three times a day (TID) | ORAL | 1 refills | Status: AC | PRN

## 2023-04-12 MED ORDER — SODIUM CHLORIDE 0.9 % IV SOLN: INTRAVENOUS | Status: DC

## 2023-04-12 MED ORDER — ONDANSETRON HCL 4 MG/2ML IJ SOLN
4.0000 mg | Freq: Once | INTRAMUSCULAR | Status: AC
  Administered 2023-04-12: 4 mg via INTRAVENOUS
  Filled 2023-04-12: qty 2

## 2023-04-12 MED ORDER — HYDROMORPHONE HCL 1 MG/ML IJ SOLN
1.0000 mg | Freq: Once | INTRAMUSCULAR | Status: AC
  Administered 2023-04-12: 1 mg via INTRAVENOUS
  Filled 2023-04-12: qty 1

## 2023-04-12 MED ORDER — IOHEXOL 300 MG/ML  SOLN
100.0000 mL | Freq: Once | INTRAMUSCULAR | Status: AC | PRN
  Administered 2023-04-12: 100 mL via INTRAVENOUS

## 2023-04-12 NOTE — Discharge Instructions (Signed)
Your doctor.  Is well as your GI specialist.  CT showed some inflammation in a part of the small intestine is noted to [place].  Hopefully this will clear.  Take the Zofran ODT as needed for nausea.  Also recommend extra strength Tylenol 1000 mg every 8 hours or any abdominal pain.  No real acute findings.

## 2023-04-12 NOTE — ED Provider Notes (Addendum)
[place] [facility] Provider Note   CSN: [id number] Arrival date & time: 04/12/23  1755     History  Chief Complaint  Patient presents with   Abdominal Pain    Miranda [name] is a 47 y.o. female.  Patient currently with right upper quadrant abdominal pain.  Patient started with nausea and vomiting and severe pain on Friday.  That resolved and then on Sunday patient started with this current type of pain that is been constant.  Still has some nausea but no vomiting.  No fevers.  Patient had her gallbladder removed.  Patient is on Protonix is followed by [facility] gastroenterology but not recently.  Patient is past medical history sniffing for hypothyroidism gastroesophageal reflux disease hyperlipidemia hypertension migraine syndrome diabetes history of stroke in 2017 past surgical history significant for gallbladder removal in 2011 and a blocked intestinal repair in 1975 at age 35 months.  Patient is a current every day smoker.       Home Medications Prior to Admission medications   Medication Sig Start Date End Date Taking? Authorizing Provider  acetaminophen (TYLENOL) 325 MG tablet Take 2 tablets (650 mg total) by mouth every 6 (six) hours as needed for mild pain (pain score 1-3 or temp > 100.5). 07/07/21   Magnant, Joycie Peek, PA-C  albuterol (PROAIR HFA) 108 (90 Base) MCG/ACT inhaler Inhale 2 puffs into the lungs every 4 (four) hours as needed for wheezing. 01/22/19   Nelwyn Salisbury, MD  ARIPiprazole (ABILIFY) 30 MG tablet Take 30 mg by mouth every evening.    [provider]  baclofen (LIORESAL) 10 MG tablet Take 10 mg by mouth every 4 (four) hours as needed (headaches). Patient not taking: Reported on 11/09/2021 11/20/19   [provider]  budesonide-formoterol (SYMBICORT) 160-4.5 MCG/ACT inhaler Inhale 2 puffs into the lungs 2 (two) times daily. Patient taking differently: Inhale 2 puffs into the lungs 2 (two) times daily as  needed (asthma). 07/24/17   Nelwyn Salisbury, MD  buPROPion (WELLBUTRIN XL) 150 MG 24 hr tablet Take 150-300 mg by mouth See admin instructions. 300 mg in the morning, 150 mg in the evening 03/17/19   [provider]  clopidogrel (PLAVIX) 75 MG tablet TAKE ONE TABLET BY MOUTH DAILY Patient taking differently: Take 75 mg by mouth daily. 05/21/19   Nelwyn Salisbury, MD  diclofenac Sodium (VOLTAREN) 1 % GEL Apply 1 application topically 2 (two) times daily as needed (pain).    [provider]  diphenoxylate-atropine (LOMOTIL) 2.5-0.025 MG tablet Take 1 tablet by mouth every 6 (six) hours as needed for diarrhea or loose stools. Patient taking differently: Take 2 tablets by mouth every 6 (six) hours as needed for diarrhea or loose stools. 10/15/19   Zehr, Princella Pellegrini, PA-C  docusate sodium (COLACE) 100 MG capsule Take 1 capsule (100 mg total) by mouth 2 (two) times daily. 07/07/21   Magnant, Charles L, PA-C  famotidine (PEPCID) 40 MG tablet Take 40 mg by mouth 2 (two) times daily.    [provider]  fluticasone (FLONASE) 50 MCG/ACT nasal spray Place 2 sprays into both nostrils daily as needed for allergies or rhinitis.    [provider]  gabapentin (NEURONTIN) 400 MG capsule TAKE ONE CAPSULE BY MOUTH THREE TIMES A DAY 06/09/22   Hudnall, Azucena Fallen, MD  Glucagon (BAQSIMI ONE PACK) 3 MG/DOSE POWD Place 3 mg into the nose daily as needed (low blood sugar).    [provider]  hydrOXYzine (VISTARIL) 50 MG capsule Take 50 mg by mouth 3 (three) times daily. 12/01/19   [provider]  hyoscyamine (LEVSIN SL) 0.125 MG SL tablet Place 1 tablet (0.125 mg total) under the tongue every 6 (six) hours as needed. 10/15/19   Zehr, Princella Pellegrini, PA-C  ibuprofen (ADVIL) 200 MG tablet Take 800 mg by mouth in the morning and at bedtime.    [provider]  insulin aspart (NOVOLOG) 100 UNIT/ML injection Use in the insulin pump as directed 11/01/18   Nelwyn Salisbury, MD   levothyroxine (SYNTHROID, LEVOTHROID) 125 MCG tablet Take 1 tablet (125 mcg total) by mouth daily before breakfast. 09/26/17   Nelwyn Salisbury, MD  lisinopril (ZESTRIL) 10 MG tablet Take 10 mg by mouth daily. 04/11/21   [provider]  loratadine (CLARITIN) 10 MG tablet Take 10 mg by mouth daily as needed for allergies.    [provider]  Menthol, Topical Analgesic, (BIOFREEZE) 10 % LIQD Apply 1 application topically 2 (two) times daily as needed (pain).    [provider]  metoCLOPramide (REGLAN) 5 MG tablet Take 0.5 tablets (2.5 mg total) by mouth 4 (four) times daily -  before meals and at bedtime. 07/20/21   Zehr, Princella Pellegrini, PA-C  metoprolol succinate (TOPROL-XL) 25 MG 24 hr tablet Take 0.5 tablets (12.5 mg total) by mouth daily. Patient taking differently: Take 25 mg by mouth daily. 10/28/16   Adonis Brook, NP  montelukast (SINGULAIR) 10 MG tablet Take 10 mg by mouth daily. 01/31/21   [provider]  Multiple Vitamin (MULTIVITAMIN) tablet Take 1 tablet by mouth daily.    [provider]  nystatin [facility]) powder Apply topically QID. Patient taking differently: Apply 1 application topically 2 (two) times daily as needed (yeast/ dry skin). 01/25/21   Nelwyn Salisbury, MD  ondansetron (ZOFRAN-ODT) 4 MG disintegrating tablet Take 4 mg by mouth every 8 (eight) hours as needed for nausea or vomiting.    [provider]  oxyCODONE (OXY IR/ROXICODONE) 5 MG immediate release tablet Take 1 tablet (5 mg total) by mouth every 6 (six) hours as needed for moderate pain (pain score 4-6). 08/22/21   Magnant, Charles L, PA-C  OZEMPIC, 0.25 OR 0.5 MG/DOSE, 2 MG/1.5ML SOPN Inject 0.5 mg into the skin every Sunday. 01/19/21   [provider]  pantoprazole (PROTONIX) 40 MG tablet TAKE ONE TABLET BY MOUTH TWICE A DAY 09/20/22   Nelwyn Salisbury, MD  polyethylene glycol powder (GLYCOLAX/MIRALAX) 17 GM/SCOOP powder Use 1 capful twice daily in 8 ounces of fluid  07/20/21   Zehr, Shanda Bumps D, PA-C  prazosin (MINIPRESS) 5 MG capsule Take 10 mg by mouth at bedtime.    [provider]  promethazine (PHENERGAN) 25 MG tablet Take 1 tablet (25 mg total) by mouth every 4 (four) hours as needed for nausea or vomiting. Patient taking differently: Take 25 mg by mouth every 6 (six) hours as needed for nausea or vomiting. 03/04/19   Nelwyn Salisbury, MD  Rimegepant Sulfate (NURTEC) 75 MG TBDP Take 75 mg by mouth daily as needed (migraines).    [provider]  rosuvastatin (CRESTOR) 20 MG tablet Take 1 tablet (20 mg total) by mouth daily. 09/04/19   Nelwyn Salisbury, MD  terconazole (TERAZOL 7) 0.4 % vaginal cream Place 1 applicator vaginally at bedtime as needed (yeast infections).    [provider]  topiramate (TOPAMAX) 100 MG tablet Take 100 mg by mouth 2 (two)  times daily.  01/21/18   [provider]  traZODone (DESYREL) 150 MG tablet Take 150 mg by mouth at bedtime.    [provider]  Ubrogepant (UBRELVY) 100 MG TABS Take 100 mg by mouth daily as needed (migraines). May repeat in 2 hours as needed. No more than 200 mg in 24 hours    [provider]  valACYclovir (VALTREX) 1000 MG tablet Take 1 tablet (1,000 mg total) by mouth 3 (three) times daily. Patient taking differently: Take 1,000 mg by mouth 2 (two) times daily as needed (breakouts). 08/09/20   Nelwyn Salisbury, MD      Allergies    Macrobid [nitrofurantoin], Metformin and related, Byetta 10 mcg pen [exenatide], Clindamycin/lincomycin, Geodon [ziprasidone hcl], Lipitor [atorvastatin], Nsaids, Penicillins, Trulicity [dulaglutide], Avocado, and Tramadol hcl    Review of Systems   Review of Systems  Constitutional:  Negative for chills and fever.  HENT:  Negative for ear pain and sore throat.   Eyes:  Negative for pain and visual disturbance.  Respiratory:  Negative for cough and shortness of breath.   Cardiovascular:  Negative for chest pain and palpitations.   Gastrointestinal:  Positive for abdominal pain, nausea and vomiting.  Genitourinary:  Negative for dysuria and hematuria.  Musculoskeletal:  Negative for arthralgias and back pain.  Skin:  Negative for color change and rash.  Neurological:  Negative for seizures and syncope.  All other systems reviewed and are negative.   Physical Exam Updated Vital Signs BP 123/85 (BP Location: Right Arm)   Pulse 78   Temp 98.3 F (36.8 C) (Oral)   Resp 16   Ht 1.575 m (5\' 2" )   Wt 85.5 kg   LMP 02/05/2023   SpO2 100%   BMI 34.48 kg/m  Physical Exam Vitals and nursing note reviewed.  Constitutional:      General: She is not in acute distress.    Appearance: Normal appearance. She is well-developed.  HENT:     Head: Normocephalic and atraumatic.  Eyes:     Extraocular Movements: Extraocular movements intact.     Conjunctiva/sclera: Conjunctivae normal.     Pupils: Pupils are equal, round, and reactive to light.  Cardiovascular:     Rate and Rhythm: Normal rate and regular rhythm.     Heart sounds: No murmur heard. Pulmonary:     Effort: Pulmonary effort is normal. No respiratory distress.     Breath sounds: Normal breath sounds.  Abdominal:     Palpations: Abdomen is soft.     Tenderness: There is abdominal tenderness. There is no guarding.  Musculoskeletal:        General: No swelling.     Cervical back: Normal range of motion and neck supple.  Skin:    General: Skin is warm and dry.     Capillary Refill: Capillary refill takes less than 2 seconds.  Neurological:     General: No focal deficit present.     Mental Status: She is alert and oriented to person, place, and time.  Psychiatric:        Mood and Affect: Mood normal.     ED Results / Procedures / Treatments   Labs (all labs ordered are listed, but only abnormal results are displayed) Labs Reviewed  COMPREHENSIVE METABOLIC PANEL - Abnormal; Notable for the following components:      Result Value   Potassium 3.2 (*)     Glucose, Bld 104 (*)    All other components within normal limits  LIPASE, BLOOD  CBC  URINALYSIS, ROUTINE W REFLEX MICROSCOPIC  PREGNANCY, URINE    EKG None  Radiology No results found.  Procedures Procedures    Medications Ordered in ED Medications - No data to display  ED Course/ Medical Decision Making/ A&P                             Medical Decision Making Amount and/or Complexity of Data Reviewed Labs: ordered. Radiology: ordered.  Risk Prescription drug management.   Patient's labs are very reassuring.  Lipase normal.  Complete metabolic panel normal including liver function test and kidney function test seem a little low at 3.2.  CBC no leukocytosis hemoglobin 14.7 platelets 179.  Urinalysis is negative pregnancy test negative.  Will give some IV fluids will get CT scan of the abdomen and pelvis with IV contrast.  Will give some pain medicine and antinausea medicine.  Symptoms could be consistent with peptic ulcer disease if CT scan negative patient is already on Protonix.  May need follow-up with [facility] for upper endoscopy.  Patient CT scan of the abdomen and significant for thickened folds of the jejunum with mild wall enhancement and nondilated fluid filling in the ileum findings consistent with nonspecific enteritis which may go along with like a viral type illness.  No mesenteric and inflammatory changes or adenopathy.  The appendix is normal.  No signs of any diverticulitis.  There is some evidence of diverticulosis.  Would treat patient symptomatically have her follow-up with her doctor as well as her GI doctor.  Patient's labs were reassuring.  Final Clinical Impression(s) / ED Diagnoses Final diagnoses:  Right upper quadrant abdominal pain    Rx / DC Orders ED Discharge Orders     None         Vanetta Mulders, MD 04/12/23 1945    Vanetta Mulders, MD 04/12/23 2104

## 2023-04-12 NOTE — ED Triage Notes (Signed)
Abdominal pain with N/V on Friday.  Pt continues to have abdominal pain with has worsened over time.  Last BM 2 days ago.  No current emesis.  No known fever.

## 2023-05-07 ENCOUNTER — Emergency Department (HOSPITAL_BASED_OUTPATIENT_CLINIC_OR_DEPARTMENT_OTHER)
Admission: EM | Admit: 2023-05-07 | Discharge: 2023-05-07 | Disposition: A | Discharge status: Home / Self Care | Payer: BC Managed Care – PPO | Attending: Emergency Medicine | Admitting: Emergency Medicine

## 2023-05-07 ENCOUNTER — Emergency Department (HOSPITAL_BASED_OUTPATIENT_CLINIC_OR_DEPARTMENT_OTHER): Payer: BC Managed Care – PPO

## 2023-05-07 ENCOUNTER — Other Ambulatory Visit: Payer: Self-pay

## 2023-05-07 ENCOUNTER — Encounter (HOSPITAL_BASED_OUTPATIENT_CLINIC_OR_DEPARTMENT_OTHER): Payer: Self-pay

## 2023-05-07 DIAGNOSIS — Z8673 Personal history of transient ischemic attack (TIA), and cerebral infarction without residual deficits: Secondary | ICD-10-CM | POA: Insufficient documentation

## 2023-05-07 DIAGNOSIS — I10 Essential (primary) hypertension: Secondary | ICD-10-CM | POA: Diagnosis not present

## 2023-05-07 DIAGNOSIS — Z794 Long term (current) use of insulin: Secondary | ICD-10-CM | POA: Insufficient documentation

## 2023-05-07 DIAGNOSIS — E119 Type 2 diabetes mellitus without complications: Secondary | ICD-10-CM | POA: Diagnosis not present

## 2023-05-07 DIAGNOSIS — Z79899 Other long term (current) drug therapy: Secondary | ICD-10-CM | POA: Diagnosis not present

## 2023-05-07 DIAGNOSIS — Z7902 Long term (current) use of antithrombotics/antiplatelets: Secondary | ICD-10-CM | POA: Diagnosis not present

## 2023-05-07 DIAGNOSIS — R1011 Right upper quadrant pain: Secondary | ICD-10-CM | POA: Diagnosis present

## 2023-05-07 DIAGNOSIS — K529 Noninfective gastroenteritis and colitis, unspecified: Secondary | ICD-10-CM | POA: Insufficient documentation

## 2023-05-07 LAB — URINALYSIS, ROUTINE W REFLEX MICROSCOPIC
Bilirubin Urine: NEGATIVE
Glucose, UA: NEGATIVE mg/dL
Hgb urine dipstick: NEGATIVE
Ketones, ur: NEGATIVE mg/dL
Leukocytes,Ua: NEGATIVE
Nitrite: NEGATIVE
Protein, ur: NEGATIVE mg/dL
Specific Gravity, Urine: 1.015 (ref 1.005–1.030)
pH: 7 (ref 5.0–8.0)

## 2023-05-07 LAB — CBC WITH DIFFERENTIAL/PLATELET
Abs Immature Granulocytes: 0.05 10*3/uL (ref 0.00–0.07)
Basophils Absolute: 0.1 10*3/uL (ref 0.0–0.1)
Basophils Relative: 1 %
Eosinophils Absolute: 0.2 10*3/uL (ref 0.0–0.5)
Eosinophils Relative: 2 %
HCT: 40.4 % (ref 36.0–46.0)
Hemoglobin: 14.3 g/dL (ref 12.0–15.0)
Immature Granulocytes: 1 %
Lymphocytes Relative: 32 %
Lymphs Abs: 3.3 10*3/uL (ref 0.7–4.0)
MCH: 31.1 pg (ref 26.0–34.0)
MCHC: 35.4 g/dL (ref 30.0–36.0)
MCV: 87.8 fL (ref 80.0–100.0)
Monocytes Absolute: 0.7 10*3/uL (ref 0.1–1.0)
Monocytes Relative: 7 %
Neutro Abs: 5.9 10*3/uL (ref 1.7–7.7)
Neutrophils Relative %: 57 %
Platelets: 189 10*3/uL (ref 150–400)
RBC: 4.6 MIL/uL (ref 3.87–5.11)
RDW: 13.1 % (ref 11.5–15.5)
WBC: 10.2 10*3/uL (ref 4.0–10.5)
nRBC: 0 % (ref 0.0–0.2)

## 2023-05-07 LAB — COMPREHENSIVE METABOLIC PANEL
ALT: 20 U/L (ref 0–44)
AST: 18 U/L (ref 15–41)
Albumin: 3.9 g/dL (ref 3.5–5.0)
Alkaline Phosphatase: 55 U/L (ref 38–126)
Anion gap: 9 (ref 5–15)
BUN: 9 mg/dL (ref 6–20)
CO2: 21 mmol/L — ABNORMAL LOW (ref 22–32)
Calcium: 9.4 mg/dL (ref 8.9–10.3)
Chloride: 106 mmol/L (ref 98–111)
Creatinine, Ser: 0.64 mg/dL (ref 0.44–1.00)
GFR, Estimated: >60 mL/min (ref >60)
Glucose, Bld: 108 mg/dL — ABNORMAL HIGH (ref 70–99)
Potassium: 3.6 mmol/L (ref 3.5–5.1)
Sodium: 136 mmol/L (ref 135–145)
Total Bilirubin: 0.5 mg/dL (ref 0.3–1.2)
Total Protein: 6.8 g/dL (ref 6.5–8.1)

## 2023-05-07 LAB — PREGNANCY, URINE: Preg Test, Ur: NEGATIVE

## 2023-05-07 LAB — LIPASE, BLOOD: Lipase: 27 U/L (ref 11–51)

## 2023-05-07 MED ORDER — LIDOCAINE VISCOUS HCL 2 % MT SOLN
15.0000 mL | Freq: Once | OROMUCOSAL | Status: AC
  Administered 2023-05-07: 15 mL via ORAL
  Filled 2023-05-07: qty 15

## 2023-05-07 MED ORDER — ALUM & MAG HYDROXIDE-SIMETH 200-200-20 MG/5ML PO SUSP
30.0000 mL | Freq: Once | ORAL | Status: AC
  Administered 2023-05-07: 30 mL via ORAL
  Filled 2023-05-07: qty 30

## 2023-05-07 MED ORDER — IOHEXOL 300 MG/ML  SOLN
100.0000 mL | Freq: Once | INTRAMUSCULAR | Status: AC | PRN
  Administered 2023-05-07: 100 mL via INTRAVENOUS

## 2023-05-07 NOTE — ED Triage Notes (Signed)
Pt reports right sided abd pain that worsened this morning but has been intermittent x 1 year. Pain is worse with food. Denies nausea/vomiting, endorses diarrhea but states she took a Laxative 3 days ago. She had a colonoscopy on Friday.

## 2023-05-07 NOTE — ED Provider Notes (Signed)
[place] [facility] Provider Note   CSN: [id number] Arrival date & time: 05/07/23  1509     History  Chief Complaint  Patient presents with   Abdominal Pain    Miranda [name] is a 47 y.o. female history of GAD, diabetes, hypertension, stroke, portal hypertension presented with right upper quadrant pain for the past few weeks.  Patient states that she had her gallbladder taken out in 2011 but that a few weeks ago came in with right upper quadrant pain.  Patient states that she had a negative CT scan and followed up with her GI specialist to get a colonoscopy and that was ultimately reassuring but not endoscopy.  Patient states that the colonoscopy took place last Friday and since then he has patient introduce solid food to her diet patient begins to have right upper quadrant pain after she eats.  Patient states that she is able to eat and drink however.  Patient dates she has fatty liver disease but no other liver issues.  Patient does take pantoprazole for her acid reflux.  Patient denies blood thinners, nausea/vomiting, chest pain, shortness of breath, fevers  Home Medications Prior to Admission medications   Medication Sig Start Date End Date Taking? Authorizing Provider  acetaminophen (TYLENOL) 325 MG tablet Take 2 tablets (650 mg total) by mouth every 6 (six) hours as needed for mild pain (pain score 1-3 or temp > 100.5). 07/07/21   Magnant, Joycie Peek, PA-C  albuterol (PROAIR HFA) 108 (90 Base) MCG/ACT inhaler Inhale 2 puffs into the lungs every 4 (four) hours as needed for wheezing. 01/22/19   Nelwyn Salisbury, MD  ARIPiprazole (ABILIFY) 30 MG tablet Take 30 mg by mouth every evening.    [provider]  baclofen (LIORESAL) 10 MG tablet Take 10 mg by mouth every 4 (four) hours as needed (headaches). Patient not taking: Reported on 11/09/2021 11/20/19   [provider]  budesonide-formoterol (SYMBICORT) 160-4.5 MCG/ACT inhaler Inhale  2 puffs into the lungs 2 (two) times daily. Patient taking differently: Inhale 2 puffs into the lungs 2 (two) times daily as needed (asthma). 07/24/17   Nelwyn Salisbury, MD  buPROPion (WELLBUTRIN XL) 150 MG 24 hr tablet Take 150-300 mg by mouth See admin instructions. 300 mg in the morning, 150 mg in the evening 03/17/19   [provider]  clopidogrel (PLAVIX) 75 MG tablet TAKE ONE TABLET BY MOUTH DAILY Patient taking differently: Take 75 mg by mouth daily. 05/21/19   Nelwyn Salisbury, MD  diclofenac Sodium (VOLTAREN) 1 % GEL Apply 1 application topically 2 (two) times daily as needed (pain).    [provider]  diphenoxylate-atropine (LOMOTIL) 2.5-0.025 MG tablet Take 1 tablet by mouth every 6 (six) hours as needed for diarrhea or loose stools. Patient taking differently: Take 2 tablets by mouth every 6 (six) hours as needed for diarrhea or loose stools. 10/15/19   Zehr, Princella Pellegrini, PA-C  docusate sodium (COLACE) 100 MG capsule Take 1 capsule (100 mg total) by mouth 2 (two) times daily. 07/07/21   Magnant, Charles L, PA-C  famotidine (PEPCID) 40 MG tablet Take 40 mg by mouth 2 (two) times daily.    [provider]  fluticasone (FLONASE) 50 MCG/ACT nasal spray Place 2 sprays into both nostrils daily as needed for allergies or rhinitis.    [provider]  gabapentin (NEURONTIN) 400 MG capsule TAKE ONE CAPSULE BY MOUTH THREE TIMES A DAY 06/09/22   Hudnall, Vincenza Hews  R, MD  Glucagon (BAQSIMI ONE PACK) 3 MG/DOSE POWD Place 3 mg into the nose daily as needed (low blood sugar).    [provider]  hydrOXYzine (VISTARIL) 50 MG capsule Take 50 mg by mouth 3 (three) times daily. 12/01/19   [provider]  hyoscyamine (LEVSIN SL) 0.125 MG SL tablet Place 1 tablet (0.125 mg total) under the tongue every 6 (six) hours as needed. 10/15/19   Zehr, Princella Pellegrini, PA-C  ibuprofen (ADVIL) 200 MG tablet Take 800 mg by mouth in the morning and at bedtime.    [provider]   insulin aspart (NOVOLOG) 100 UNIT/ML injection Use in the insulin pump as directed 11/01/18   Nelwyn Salisbury, MD  levothyroxine (SYNTHROID, LEVOTHROID) 125 MCG tablet Take 1 tablet (125 mcg total) by mouth daily before breakfast. 09/26/17   Nelwyn Salisbury, MD  lisinopril (ZESTRIL) 10 MG tablet Take 10 mg by mouth daily. 04/11/21   [provider]  loratadine (CLARITIN) 10 MG tablet Take 10 mg by mouth daily as needed for allergies.    [provider]  Menthol, Topical Analgesic, (BIOFREEZE) 10 % LIQD Apply 1 application topically 2 (two) times daily as needed (pain).    [provider]  metoCLOPramide (REGLAN) 5 MG tablet Take 0.5 tablets (2.5 mg total) by mouth 4 (four) times daily -  before meals and at bedtime. 07/20/21   Zehr, Princella Pellegrini, PA-C  metoprolol succinate (TOPROL-XL) 25 MG 24 hr tablet Take 0.5 tablets (12.5 mg total) by mouth daily. Patient taking differently: Take 25 mg by mouth daily. 10/28/16   Adonis Brook, NP  montelukast (SINGULAIR) 10 MG tablet Take 10 mg by mouth daily. 01/31/21   [provider]  Multiple Vitamin (MULTIVITAMIN) tablet Take 1 tablet by mouth daily.    [provider]  nystatin [facility]) powder Apply topically QID. Patient taking differently: Apply 1 application topically 2 (two) times daily as needed (yeast/ dry skin). 01/25/21   Nelwyn Salisbury, MD  ondansetron (ZOFRAN-ODT) 4 MG disintegrating tablet Take 4 mg by mouth every 8 (eight) hours as needed for nausea or vomiting.    [provider]  ondansetron (ZOFRAN-ODT) 4 MG disintegrating tablet Take 1 tablet (4 mg total) by mouth every 8 (eight) hours as needed. 04/12/23   Vanetta Mulders, MD  oxyCODONE (OXY IR/ROXICODONE) 5 MG immediate release tablet Take 1 tablet (5 mg total) by mouth every 6 (six) hours as needed for moderate pain (pain score 4-6). 08/22/21   Magnant, Charles L, PA-C  OZEMPIC, 0.25 OR 0.5 MG/DOSE, 2 MG/1.5ML SOPN Inject 0.5 mg into the skin  every Sunday. 01/19/21   [provider]  pantoprazole (PROTONIX) 40 MG tablet TAKE ONE TABLET BY MOUTH TWICE A DAY 09/20/22   Nelwyn Salisbury, MD  polyethylene glycol powder (GLYCOLAX/MIRALAX) 17 GM/SCOOP powder Use 1 capful twice daily in 8 ounces of fluid 07/20/21   Zehr, Shanda Bumps D, PA-C  prazosin (MINIPRESS) 5 MG capsule Take 10 mg by mouth at bedtime.    [provider]  promethazine (PHENERGAN) 25 MG tablet Take 1 tablet (25 mg total) by mouth every 4 (four) hours as needed for nausea or vomiting. Patient taking differently: Take 25 mg by mouth every 6 (six) hours as needed for nausea or vomiting. 03/04/19   Nelwyn Salisbury, MD  Rimegepant Sulfate (NURTEC) 75 MG TBDP Take 75 mg by mouth daily as needed (migraines).    [provider]  rosuvastatin (CRESTOR) 20 MG  tablet Take 1 tablet (20 mg total) by mouth daily. 09/04/19   Nelwyn Salisbury, MD  terconazole (TERAZOL 7) 0.4 % vaginal cream Place 1 applicator vaginally at bedtime as needed (yeast infections).    [provider]  topiramate (TOPAMAX) 100 MG tablet Take 100 mg by mouth 2 (two) times daily.  01/21/18   [provider]  traZODone (DESYREL) 150 MG tablet Take 150 mg by mouth at bedtime.    [provider]  Ubrogepant (UBRELVY) 100 MG TABS Take 100 mg by mouth daily as needed (migraines). May repeat in 2 hours as needed. No more than 200 mg in 24 hours    [provider]  valACYclovir (VALTREX) 1000 MG tablet Take 1 tablet (1,000 mg total) by mouth 3 (three) times daily. Patient taking differently: Take 1,000 mg by mouth 2 (two) times daily as needed (breakouts). 08/09/20   Nelwyn Salisbury, MD      Allergies    Macrobid [nitrofurantoin], Metformin and related, Byetta 10 mcg pen [exenatide], Clindamycin/lincomycin, Geodon [ziprasidone hcl], Lipitor [atorvastatin], Nsaids, Penicillins, Trulicity [dulaglutide], Avocado, and Tramadol hcl    Review of Systems   Review of Systems   Gastrointestinal:  Positive for abdominal pain.    Physical Exam Updated Vital Signs BP 105/73   Pulse (!) 59   Temp 97.9 F (36.6 C)   Resp 18   Ht 5\' 2"  (1.575 m)   Wt 85.3 kg   LMP 02/05/2023   SpO2 98%   BMI 34.39 kg/m  Physical Exam Vitals reviewed.  Constitutional:      General: She is not in acute distress. HENT:     Head: Normocephalic and atraumatic.  Eyes:     Extraocular Movements: Extraocular movements intact.     Conjunctiva/sclera: Conjunctivae normal.     Pupils: Pupils are equal, round, and reactive to light.  Cardiovascular:     Rate and Rhythm: Normal rate and regular rhythm.     Pulses: Normal pulses.     Heart sounds: Normal heart sounds.     Comments: 2+ bilateral radial/dorsalis pedis pulses with regular rate Pulmonary:     Effort: Pulmonary effort is normal. No respiratory distress.     Breath sounds: Normal breath sounds.  Abdominal:     Palpations: Abdomen is soft.     Tenderness: There is abdominal tenderness in the right upper quadrant and epigastric area. There is no guarding or rebound. Negative signs include Rovsing's sign and psoas sign.  Musculoskeletal:        General: Normal range of motion.     Cervical back: Normal range of motion and neck supple.     Comments: 5 out of 5 bilateral grip/leg extension strength  Skin:    General: Skin is warm and dry.     Capillary Refill: Capillary refill takes less than 2 seconds.  Neurological:     General: No focal deficit present.     Mental Status: She is alert and oriented to person, place, and time.     Comments: Sensation intact in all 4 limbs  Psychiatric:        Mood and Affect: Mood normal.     ED Results / Procedures / Treatments   Labs (all labs ordered are listed, but only abnormal results are displayed) Labs Reviewed  COMPREHENSIVE METABOLIC PANEL - Abnormal; Notable for the following components:      Result Value   CO2 21 (*)    Glucose, Bld 108 (*)  All other  components within normal limits  CBC WITH DIFFERENTIAL/PLATELET  LIPASE, BLOOD  URINALYSIS, ROUTINE W REFLEX MICROSCOPIC  PREGNANCY, URINE  POC URINE PREG, ED    EKG None  Radiology CT ABDOMEN PELVIS W CONTRAST  Result Date: 05/07/2023 CLINICAL DATA:  Abdominal pain EXAM: CT ABDOMEN AND PELVIS WITH CONTRAST TECHNIQUE: Multidetector CT imaging of the abdomen and pelvis was performed using the standard protocol following bolus administration of intravenous contrast. RADIATION DOSE REDUCTION: This exam was performed according to the departmental dose-optimization program which includes automated exposure control, adjustment of the mA and/or kV according to patient size and/or use of iterative reconstruction technique. CONTRAST:  OMNIPAQUE IOHEXOL 300 MG/ML  SOLN COMPARISON:  04/12/2023 FINDINGS: Lower chest: There is no focal consolidation in the lower lung fields. Small linear densities are seen in posterior lower lung fields suggesting minimal scarring or subsegmental atelectasis. Hepatobiliary: Surgical clips are seen in gallbladder fossa. No focal abnormalities are seen in liver. There is no dilation of bile ducts. Pancreas: No focal abnormalities are seen. Spleen: Unremarkable. Adrenals/Urinary Tract: Adrenals are unremarkable. There is no hydronephrosis. There are no renal or ureteral stones. Urinary bladder is unremarkable. Stomach/Bowel: Small hiatal hernia is seen. There is mild dilation of proximal jejunum. There is no significant wall thickening in small bowel loops. The appendix is not dilated. There is fluid in the lumen of distal small bowel loops and right colon. There is no wall thickening in colon. There is no pericolic stranding. Vascular/Lymphatic: Scattered arterial calcifications are seen. Reproductive: Unremarkable. Other: There is no ascites or pneumoperitoneum. Musculoskeletal: No acute findings are seen. IMPRESSION: There is no evidence of intestinal obstruction or  pneumoperitoneum. There is no hydronephrosis. Appendix is not dilated. There is mild dilation of proximal small bowel loops which may suggest ileus or nonspecific enteritis. Small hiatal hernia.  Status post cholecystectomy. Electronically Signed   By: Ernie Avena M.D.   On: 05/07/2023 20:19    Procedures Procedures    Medications Ordered in ED Medications  alum & mag hydroxide-simeth (MAALOX/MYLANTA) 200-200-20 MG/5ML suspension 30 mL (30 mLs Oral Given 05/07/23 1856)    And  lidocaine (XYLOCAINE) 2 % viscous mouth solution 15 mL (15 mLs Oral Given 05/07/23 1856)  iohexol (OMNIPAQUE) 300 MG/ML solution 100 mL (100 mLs Intravenous Contrast Given 05/07/23 1952)    ED Course/ Medical Decision Making/ A&P                             Medical Decision Making Risk OTC drugs. Prescription drug management.   Miranda [name] 47 y.o. presented today for right upper quadrant/epigastric. Working DDx that I considered at this time includes, but not limited to, PUD, gastroenteritis, colitis, small bowel obstruction, appendicitis, cholecystitis, pancreatitis, nephrolithiasis, AAA, UTI, pyelonephritis, ruptured ectopic pregnancy, PID, ovarian torsion.  R/o DDx: gastroenteritis, colitis, small bowel obstruction, appendicitis, cholecystitis, pancreatitis, nephrolithiasis, AAA, UTI, pyelonephritis, ruptured ectopic pregnancy, PID, ovarian torsion: These are considered less likely due to history of present illness and physical exam findings.  Review of prior external notes: 05/02/2023 office visit  Unique Tests and My Interpretation:  CBC with differential: Unremarkable CMP: Unremarkable Lipase: Unremarkable UA: Unremarkable CT Abd/Pelvis with contrast: enteritis versus ileus  Discussion with Independent Historian: None  Discussion of Management of Tests: None  Risk: Medium: prescription drug management  Risk Stratification Score: None  Plan: On exam patient was in no acute distress  stable vitals.  Patient was tender to  palpation in epigastric and right upper quadrant however no peritoneal signs were noted.  The rest patient's physical dam is unremarkable.  Patient's labs from triage are reassuring and CT abdomen pelvis with contrast was ordered from triage that we will await.  Patient's symptoms sound suspicious for possible gastric ulcer/duodenal ulcer as it is food related and she gets epigastric/right upper quadrant pain versus exacerbation of her acid reflux.  Patient will be given GI cocktail and monitored.  Patient stable this time.  Patient CT came back showing enteric Titus versus ileus.  This is been seen previously on patient's previous CT scan however the patient endorsing epigastric pain that gets worse after eating I spoke patient has peptic ulcer disease and will need GI follow-up for an upper endoscopy to further evaluate.  Patient stated that the GI cocktail did alleviate symptoms for a little bit but patient is still endorsing some pain.  I encouraged patient to keep using her pantoprazole as prescribed and to avoid any triggers of her acid reflux and to follow-up with GI.  Due to patient not having a white count or endorsing any infectious symptoms antibiotics were not prescribed at this time.  Patient states that she is comfortable with this plan.  Patient was given return precautions. Patient stable for discharge at this time.  Patient verbalized understanding of plan.         Final Clinical Impression(s) / ED Diagnoses Final diagnoses:  Enteritis    Rx / DC Orders ED Discharge Orders     None         Remi Deter 05/07/23 2109    Derwood Kaplan, MD 05/08/23 [phone]

## 2023-05-07 NOTE — Discharge Instructions (Signed)
Please follow-up with your GI specialist in regards to recent symptoms and ER visit.  Today your CT shows you have enteritis vs ileus possibly causing symptoms however he most likely have peptic ulcer disease they will need to have an upper endoscopy done by GI to further evaluate.  Please continue to use your pantoprazole as prescribed and avoid any triggers of your acid reflux.  If symptoms change or worsen please return to ER.

## 2023-05-07 NOTE — ED Provider Triage Note (Signed)
Emergency Medicine Provider Triage Evaluation Note  Miranda [name] , a 47 y.o. female  was evaluated in triage.  Pt complains of abdominal pain. Had some ab discomfort last night but when she woke up this morning it was much worse. Located in RUQ. Patient is s/p cholecystectomy. Endorses some diarrhea but no nause or vomiting. Had colonoscopy this past Tuesday. Poly was removed but otherwise unremarkable study per patient.   Review of Systems  Positive: see above Negative: See above  Physical Exam  LMP 02/05/2023  Gen:   Awake, no distress   Resp:  Normal effort  MSK:   Moves extremities without difficulty  Other:    Medical Decision Making  Medically screening exam initiated at 3:37 PM.  Appropriate orders placed.  Miranda [name] was informed that the remainder of the evaluation will be completed by another provider, this initial triage assessment does not replace that evaluation, and the importance of remaining in the ED until their evaluation is complete.  Work up started   Gareth Eagle, PA-C 05/07/23 1539

## 2023-07-31 DIAGNOSIS — N879 Dysplasia of cervix uteri, unspecified: Secondary | ICD-10-CM

## 2023-07-31 HISTORY — DX: Dysplasia of cervix uteri, unspecified: N87.9

## 2023-08-22 ENCOUNTER — Emergency Department (HOSPITAL_BASED_OUTPATIENT_CLINIC_OR_DEPARTMENT_OTHER)
Admission: EM | Admit: 2023-08-22 | Discharge: 2023-08-22 | Disposition: A | Discharge status: Home / Self Care | Payer: BC Managed Care – PPO | Attending: Emergency Medicine | Admitting: Emergency Medicine

## 2023-08-22 ENCOUNTER — Other Ambulatory Visit (HOSPITAL_BASED_OUTPATIENT_CLINIC_OR_DEPARTMENT_OTHER): Payer: Self-pay | Admitting: Emergency Medicine

## 2023-08-22 ENCOUNTER — Ambulatory Visit (HOSPITAL_BASED_OUTPATIENT_CLINIC_OR_DEPARTMENT_OTHER)
Admission: RE | Admit: 2023-08-22 | Discharge: 2023-08-22 | Disposition: A | Discharge status: Home / Self Care | Payer: BC Managed Care – PPO | Source: Ambulatory Visit | Attending: Emergency Medicine

## 2023-08-22 ENCOUNTER — Emergency Department (HOSPITAL_BASED_OUTPATIENT_CLINIC_OR_DEPARTMENT_OTHER): Payer: BC Managed Care – PPO

## 2023-08-22 ENCOUNTER — Encounter (HOSPITAL_BASED_OUTPATIENT_CLINIC_OR_DEPARTMENT_OTHER): Payer: Self-pay

## 2023-08-22 ENCOUNTER — Other Ambulatory Visit: Payer: Self-pay

## 2023-08-22 DIAGNOSIS — Z8673 Personal history of transient ischemic attack (TIA), and cerebral infarction without residual deficits: Secondary | ICD-10-CM | POA: Diagnosis not present

## 2023-08-22 DIAGNOSIS — M25522 Pain in left elbow: Secondary | ICD-10-CM | POA: Insufficient documentation

## 2023-08-22 DIAGNOSIS — Z79899 Other long term (current) drug therapy: Secondary | ICD-10-CM | POA: Diagnosis not present

## 2023-08-22 DIAGNOSIS — Z794 Long term (current) use of insulin: Secondary | ICD-10-CM | POA: Insufficient documentation

## 2023-08-22 DIAGNOSIS — M7989 Other specified soft tissue disorders: Secondary | ICD-10-CM

## 2023-08-22 DIAGNOSIS — F17210 Nicotine dependence, cigarettes, uncomplicated: Secondary | ICD-10-CM | POA: Diagnosis not present

## 2023-08-22 DIAGNOSIS — I10 Essential (primary) hypertension: Secondary | ICD-10-CM | POA: Insufficient documentation

## 2023-08-22 DIAGNOSIS — Z7951 Long term (current) use of inhaled steroids: Secondary | ICD-10-CM | POA: Diagnosis not present

## 2023-08-22 DIAGNOSIS — E039 Hypothyroidism, unspecified: Secondary | ICD-10-CM | POA: Diagnosis not present

## 2023-08-22 DIAGNOSIS — E1140 Type 2 diabetes mellitus with diabetic neuropathy, unspecified: Secondary | ICD-10-CM | POA: Diagnosis not present

## 2023-08-22 DIAGNOSIS — J45909 Unspecified asthma, uncomplicated: Secondary | ICD-10-CM | POA: Diagnosis not present

## 2023-08-22 MED ORDER — IBUPROFEN 800 MG PO TABS
800.0000 mg | ORAL_TABLET | Freq: Once | ORAL | Status: AC
  Administered 2023-08-22: 800 mg via ORAL
  Filled 2023-08-22: qty 1

## 2023-08-22 NOTE — ED Provider Notes (Signed)
[place] [facility] AT [facility] Provider Note  CSN: [id number] Arrival date & time: 08/22/23 0008  Chief Complaint(s) Arm Swelling  HPI Miranda [name] is a 47 y.o. [name] with past medical history as below, significant for arthritis, asthma, depression, DM, HLD, HTN, obesity, multiple personality, neuropathy, OSA on CPAP who presents to the ED with complaint of left elbow pain and swelling  Patient reports she had MRI done on Friday with contrast, IV was to her left AC.  She reports after the procedures began having some pain to her left elbow around the site of the IV, pain progressively worsened over the weekend.  Noticed some swelling to the lateral aspect of her left elbow.  Not improved with Tylenol she took just prior to arrival.  No history of DVT or PE.  No weakness, numbness or tingling to her left upper extremity.  She saw endocrinology yesterday  Past Medical History Past Medical History:  Diagnosis Date   Anginal pain (HCC)    Anxiety    Arthritis    Asthma    Bowel obstruction (HCC)    Cardiac arrhythmia    Depression    sees Dr. Clayborn Heron in [place]    Diabetes mellitus    sees Dr. Marlene Lard    Dyspnea    Dysrhythmia    GERD (gastroesophageal reflux disease)    Hyperlipidemia    Hypertension    Hypothyroidism    sees Dr. Leslie Dales   Insomnia    Migraine syndrome    sees Dr. Quinn Plowman at [facility]    Morbid obesity [facility])    Multiple personality (HCC)    Neck pain    Neuropathy associated with endocrine disorder (HCC)    Sleep apnea with use of continuous positive airway pressure (CPAP)    Sleep apnea is resolved due to weight loss. 08/2017   Stroke (HCC) 03/25/2016   left MCA    Stroke (HCC) 04/2016   sees Dr. Quinn Plowman at [facility]    Patient Active Problem List   Diagnosis Date Noted   Osteoarthritis of left knee 08/14/2022   Arthrofibrosis of knee joint, right    Arthritis of right knee    Diabetic  gastroparesis (HCC) 07/20/2021   Constipation 07/20/2021   S/P total knee arthroplasty, right 07/05/2021   Chronic diarrhea 04/29/2021   Belching 04/29/2021   Primary osteoarthritis of both knees 10/28/2020   Irritable bowel syndrome with diarrhea 10/16/2019   Tendinitis of right rotator cuff 09/17/2019   Hand pain, right 05/21/2019   Labral tear of hip joint 05/21/2019   Type 2 diabetes mellitus with diabetic neuropathy, unspecified (HCC) 10/01/2018   Right hip pain 05/22/2018   Tommi Rumps Quervain's disease (radial styloid tenosynovitis) 04/02/2018   Carpal tunnel syndrome, right upper limb 12/31/2017   Elbow injury, right, initial encounter 12/16/2017   Chronic pain of both shoulders 11/30/2017   PTSD (post-traumatic stress disorder) 10/25/2016   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   Diabetes mellitus (HCC) 10/25/2016   Major depressive disorder, recurrent episode, severe, with psychosis (HCC) 10/20/2016   Major depressive disorder, recurrent episode, severe, 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HCC) 10/19/2016   TIA (transient ischemic attack) 05/16/2016   Arterial ischemic stroke, MCA, left, acute (HCC) 03/29/2016   Plantar fasciitis 08/08/2013   Obesity, Class III, BMI 40-49.9 (morbid obesity) (HCC) 04/09/2013   Sleep apnea 03/31/2013   PCO (polycystic ovaries) 04/11/2012   Neck pain 11/21/2010   FATTY LIVER DISEASE 01/26/2010  NAUSEA WITH VOMITING 01/26/2010   PORTAL HYPERTENSION 01/26/2010   Migraine headache 06/28/2009   BREAST MASS 08/21/2008   KNEE PAIN 04/14/2008   Hypothyroidism 03/17/2008   Hyperlipidemia 03/17/2008   DEPRESSION 03/17/2008   Mononeuritis 03/17/2008   Essential hypertension 03/17/2008   Asthma 03/17/2008   GERD 03/17/2008   Headache 03/17/2008   Home Medication(s) Prior to Admission medications   Medication Sig Start Date End Date Taking? Authorizing Provider  acetaminophen (TYLENOL) 325 MG tablet Take 2 tablets (650 mg total) by mouth every 6  (six) hours as needed for mild pain (pain score 1-3 or temp > 100.5). 07/07/21   Magnant, Joycie Peek, PA-C  albuterol (PROAIR HFA) 108 (90 Base) MCG/ACT inhaler Inhale 2 puffs into the lungs every 4 (four) hours as needed for wheezing. 01/22/19   Nelwyn Salisbury, MD  ARIPiprazole (ABILIFY) 30 MG tablet Take 30 mg by mouth every evening.    [provider]  baclofen (LIORESAL) 10 MG tablet Take 10 mg by mouth every 4 (four) hours as needed (headaches). Patient not taking: Reported on 11/09/2021 11/20/19   [provider]  budesonide-formoterol (SYMBICORT) 160-4.5 MCG/ACT inhaler Inhale 2 puffs into the lungs 2 (two) times daily. Patient taking differently: Inhale 2 puffs into the lungs 2 (two) times daily as needed (asthma). 07/24/17   Nelwyn Salisbury, MD  buPROPion (WELLBUTRIN XL) 150 MG 24 hr tablet Take 150-300 mg by mouth See admin instructions. 300 mg in the morning, 150 mg in the evening 03/17/19   [provider]  clopidogrel (PLAVIX) 75 MG tablet TAKE ONE TABLET BY MOUTH DAILY Patient taking differently: Take 75 mg by mouth daily. 05/21/19   Nelwyn Salisbury, MD  diclofenac Sodium (VOLTAREN) 1 % GEL Apply 1 application topically 2 (two) times daily as needed (pain).    [provider]  diphenoxylate-atropine (LOMOTIL) 2.5-0.025 MG tablet Take 1 tablet by mouth every 6 (six) hours as needed for diarrhea or loose stools. Patient taking differently: Take 2 tablets by mouth every 6 (six) hours as needed for diarrhea or loose stools. 10/15/19   Zehr, Princella Pellegrini, PA-C  docusate sodium (COLACE) 100 MG capsule Take 1 capsule (100 mg total) by mouth 2 (two) times daily. 07/07/21   Magnant, Charles L, PA-C  famotidine (PEPCID) 40 MG tablet Take 40 mg by mouth 2 (two) times daily.    [provider]  fluticasone (FLONASE) 50 MCG/ACT nasal spray Place 2 sprays into both nostrils daily as needed for allergies or rhinitis.    [provider]  gabapentin (NEURONTIN) 400  MG capsule TAKE ONE CAPSULE BY MOUTH THREE TIMES A DAY 06/09/22   Hudnall, Azucena Fallen, MD  Glucagon (BAQSIMI ONE PACK) 3 MG/DOSE POWD Place 3 mg into the nose daily as needed (low blood sugar).    [provider]  hydrOXYzine (VISTARIL) 50 MG capsule Take 50 mg by mouth 3 (three) times daily. 12/01/19   [provider]  hyoscyamine (LEVSIN SL) 0.125 MG SL tablet Place 1 tablet (0.125 mg total) under the tongue every 6 (six) hours as needed. 10/15/19   Zehr, Princella Pellegrini, PA-C  ibuprofen (ADVIL) 200 MG tablet Take 800 mg by mouth in the morning and at bedtime.    [provider]  insulin aspart (NOVOLOG) 100 UNIT/ML injection Use in the insulin pump as directed 11/01/18   Nelwyn Salisbury, MD  levothyroxine (SYNTHROID, LEVOTHROID) 125 MCG tablet Take 1 tablet (125 mcg total) by mouth daily before breakfast.  09/26/17   Nelwyn Salisbury, MD  lisinopril (ZESTRIL) 10 MG tablet Take 10 mg by mouth daily. 04/11/21   [provider]  loratadine (CLARITIN) 10 MG tablet Take 10 mg by mouth daily as needed for allergies.    [provider]  Menthol, Topical Analgesic, (BIOFREEZE) 10 % LIQD Apply 1 application topically 2 (two) times daily as needed (pain).    [provider]  metoCLOPramide (REGLAN) 5 MG tablet Take 0.5 tablets (2.5 mg total) by mouth 4 (four) times daily -  before meals and at bedtime. 07/20/21   Zehr, Princella Pellegrini, PA-C  metoprolol succinate (TOPROL-XL) 25 MG 24 hr tablet Take 0.5 tablets (12.5 mg total) by mouth daily. Patient taking differently: Take 25 mg by mouth daily. 10/28/16   Adonis Brook, NP  montelukast (SINGULAIR) 10 MG tablet Take 10 mg by mouth daily. 01/31/21   [provider]  Multiple Vitamin (MULTIVITAMIN) tablet Take 1 tablet by mouth daily.    [provider]  nystatin [facility]) powder Apply topically QID. Patient taking differently: Apply 1 application topically 2 (two) times daily as needed (yeast/ dry skin). 01/25/21    Nelwyn Salisbury, MD  ondansetron (ZOFRAN-ODT) 4 MG disintegrating tablet Take 4 mg by mouth every 8 (eight) hours as needed for nausea or vomiting.    [provider]  ondansetron (ZOFRAN-ODT) 4 MG disintegrating tablet Take 1 tablet (4 mg total) by mouth every 8 (eight) hours as needed. 04/12/23   Vanetta Mulders, MD  oxyCODONE (OXY IR/ROXICODONE) 5 MG immediate release tablet Take 1 tablet (5 mg total) by mouth every 6 (six) hours as needed for moderate pain (pain score 4-6). 08/22/21   Magnant, Charles L, PA-C  OZEMPIC, 0.25 OR 0.5 MG/DOSE, 2 MG/1.5ML SOPN Inject 0.5 mg into the skin every Sunday. 01/19/21   [provider]  pantoprazole (PROTONIX) 40 MG tablet TAKE ONE TABLET BY MOUTH TWICE A DAY 09/20/22   Nelwyn Salisbury, MD  polyethylene glycol powder (GLYCOLAX/MIRALAX) 17 GM/SCOOP powder Use 1 capful twice daily in 8 ounces of fluid 07/20/21   Zehr, Shanda Bumps D, PA-C  prazosin (MINIPRESS) 5 MG capsule Take 10 mg by mouth at bedtime.    [provider]  promethazine (PHENERGAN) 25 MG tablet Take 1 tablet (25 mg total) by mouth every 4 (four) hours as needed for nausea or vomiting. Patient taking differently: Take 25 mg by mouth every 6 (six) hours as needed for nausea or vomiting. 03/04/19   Nelwyn Salisbury, MD  Rimegepant Sulfate (NURTEC) 75 MG TBDP Take 75 mg by mouth daily as needed (migraines).    [provider]  rosuvastatin (CRESTOR) 20 MG tablet Take 1 tablet (20 mg total) by mouth daily. 09/04/19   Nelwyn Salisbury, MD  terconazole (TERAZOL 7) 0.4 % vaginal cream Place 1 applicator vaginally at bedtime as needed (yeast infections).    [provider]  topiramate (TOPAMAX) 100 MG tablet Take 100 mg by mouth 2 (two) times daily.  01/21/18   [provider]  traZODone (DESYREL) 150 MG tablet Take 150 mg by mouth at bedtime.    [provider]  Ubrogepant (UBRELVY) 100 MG TABS Take 100 mg by mouth daily as needed (migraines). May repeat in  2 hours as needed. No more than 200 mg in 24 hours    [provider]  valACYclovir (VALTREX) 1000 MG tablet Take 1 tablet (1,000 mg total) by mouth 3 (three) times daily. Patient taking differently: Take 1,000  mg by mouth 2 (two) times daily as needed (breakouts). 08/09/20   Nelwyn Salisbury, MD                                                                                                                                    Past Surgical History Past Surgical History:  Procedure Laterality Date   blocked itestinal repair  1975   age 18 months   BREAST BIOPSY Left 2017   CHOLECYSTECTOMY  2011   INDUCED ABORTION  1996   forced abortion   INGUINAL HERNIA REPAIR Left 1980   age 21   KNEE CLOSED REDUCTION Right 08/22/2021   Procedure: RIGHT  KNEE MANIPULATION UNDER ANESTHESIA;  Surgeon: Cammy Copa, MD;  Location: [facility];  Service: Orthopedics;  Laterality: Right;   LAPAROSCOPIC ENDOMETRIOSIS FULGURATION  1998   TOTAL KNEE ARTHROPLASTY Right 07/05/2021   Procedure: TOTAL KNEE ARTHROPLASTY;  Surgeon: Cammy Copa, MD;  Location: [facility] OR;  Service: Orthopedics;  Laterality: Right;   WISDOM TOOTH EXTRACTION  2007   Family History Family History  Adopted: Yes  Problem Relation Age of Onset   Heart disease Other        on both sides of family   Diabetes Other        on both sides of family    Social History Social History   Tobacco Use   Smoking status: Every Day    Current packs/day: 0.25    Average packs/day: 0.3 packs/day for 5.5 years (1.4 ttl pk-yrs)    Types: Cigarettes    Start date: 02/24/2018   Smokeless tobacco: Never   Tobacco comments:    has decreased how much - trying to quit  Vaping Use   Vaping status: Former   Substances: CBD  Substance Use Topics   Alcohol use: Yes    Comment: rarely   Drug use: Yes    Types: Marijuana    Comment: 2/10 last marijuana use   Allergies Macrobid [nitrofurantoin], Metformin and related,  [place] 10 mcg pen [exenatide], Clindamycin/lincomycin, Geodon [ziprasidone hcl], Lipitor [atorvastatin], Nsaids, Penicillins, Trulicity [dulaglutide], Avocado, and Tramadol hcl  Review of Systems Review of Systems  Constitutional:  Negative for chills and fever.  Respiratory:  Negative for cough, chest tightness and shortness of breath.   Cardiovascular:  Negative for chest pain and palpitations.  Gastrointestinal:  Negative for abdominal pain and nausea.  Musculoskeletal:  Positive for myalgias.  Skin:  Negative for rash and wound.  Neurological:  Negative for numbness.  All other systems reviewed and are negative.   Physical Exam Vital Signs  I have reviewed the triage vital signs BP 106/65   Pulse 66   Temp 97.8 F (36.6 C)   Resp 20   Ht 5' 1.75" (1.568 m)   Wt 90.7 kg   LMP 02/05/2023 Comment: NEGATIVE PREGNANCY TEST IN ED TODAY.  SpO2 100%   BMI  36.88 kg/m  Physical Exam Vitals and nursing note reviewed.  Constitutional:      General: She is not in acute distress.    Appearance: Normal appearance. She is well-developed. She is not ill-appearing.  HENT:     Head: Normocephalic and atraumatic.     Right Ear: External ear normal.     Left Ear: External ear normal.     Nose: Nose normal.     Mouth/Throat:     Mouth: Mucous membranes are moist.  Eyes:     General: No scleral icterus.       Right eye: No discharge.        Left eye: No discharge.  Cardiovascular:     Rate and Rhythm: Normal rate.  Pulmonary:     Effort: Pulmonary effort is normal. No respiratory distress.     Breath sounds: No stridor.  Abdominal:     General: Abdomen is flat. There is no distension.     Tenderness: There is no guarding.  Musculoskeletal:        General: No deformity.       Arms:     Cervical back: No rigidity.     Comments: LUE NVI  Skin:    General: Skin is warm and dry.     Coloration: Skin is not cyanotic, jaundiced or pale.  Neurological:     Mental Status: She is  alert and oriented to person, place, and time.     GCS: GCS eye subscore is 4. GCS verbal subscore is 5. GCS motor subscore is 6.  Psychiatric:        Speech: Speech normal.        Behavior: Behavior normal. Behavior is cooperative.     ED Results and Treatments Labs (all labs ordered are listed, but only abnormal results are displayed) Labs Reviewed - No data to display                                                                                                                        Radiology DG Elbow Complete Left  Result Date: 08/22/2023 CLINICAL DATA:  left elbow swelling lateral Pt states she had Mri W IV contrast last Friday and either Saturday or Sunday, her left elbow started swelling up and hurting. The IV was in her left Ac area. EXAM: LEFT ELBOW - COMPLETE 3+ VIEW COMPARISON:  None Available. FINDINGS: There is no evidence of fracture, dislocation, or joint effusion. There is no evidence of arthropathy or other focal bone abnormality. Soft tissues are unremarkable. IMPRESSION: Negative. Electronically Signed   By: Tish Frederickson M.D.   On: 08/22/2023 03:15    Pertinent labs & imaging results that were available during my care of the patient were reviewed by me and considered in my medical decision making (see MDM for details).  Medications Ordered in ED Medications  ibuprofen (ADVIL) tablet 800 mg (800 mg Oral Given 08/22/23 0153)  Procedures Procedures  (including critical care time)  Medical Decision Making / ED Course    Medical Decision Making:    GRAZIA DUDZINSKI is a 47 y.o. [name] with past medical history as below, significant for arthritis, asthma, depression, DM, HLD, HTN, obesity, multiple personality, neuropathy, OSA on CPAP who presents to the ED with complaint of left elbow pain and swelling. The complaint involves an  extensive differential diagnosis and also carries with it a high risk of complications and morbidity.  Serious etiology was considered. Ddx includes but is not limited to: Infiltration from IV, hematoma, DVT, SVT, thrombophlebitis, etc.  Complete initial physical exam performed, notably the patient  was no acute distress, asleep upon entering the room.    Reviewed and confirmed nursing documentation for past medical history, family history, social history.  Vital signs reviewed.        Patient with pain and swelling to her left elbow following IV last week.  Reports the pain and swelling has been gradually worsening over the past few days.  No numbness or tingling.  Left upper extremity is NVI.  No respiratory complaints for no history of DVT or PE.  Pain unrelieved with home APAP.  Will get x-ray of the elbow, she denies any trauma.  Favor this swelling is likely localized hematoma from her recent IV/contrast infusion.  Recommend duplex to rule out DVT, evaluate abnormality.  Vascular ultrasound not available overnight.  Patient will come back in the morning for imaging  X-ray stable.  Symptoms improved with motrin  Pt will return later for duplex, will hold off on AC at this time.    The patient improved significantly and was discharged in stable condition. Detailed discussions were had with the patient regarding current findings, and need for close f/u with PCP or on call doctor. The patient has been instructed to return immediately if the symptoms worsen in any way for re-evaluation. Patient verbalized understanding and is in agreement with current care plan. All questions answered prior to discharge.               Additional history obtained: -Additional history obtained from na -External records from outside source obtained and reviewed including: Chart review including previous notes, labs, imaging, consultation notes including  Home medications Care documentation   Lab  Tests: na  EKG   EKG Interpretation Date/Time:    Ventricular Rate:    PR Interval:    QRS Duration:    QT Interval:    QTC Calculation:   R Axis:      Text Interpretation:           Imaging Studies ordered: I ordered imaging studies including elbow xr I independently visualized the following imaging with scope of interpretation limited to determining acute life threatening conditions related to emergency care; findings noted above I independently visualized and interpreted imaging. I agree with the radiologist interpretation   Medicines ordered and prescription drug management: Meds ordered this encounter  Medications   ibuprofen (ADVIL) tablet 800 mg    -I have reviewed the patients home medicines and have made adjustments as needed   Consultations Obtained: na   Cardiac Monitoring: Continuous pulse oximetry interpreted by myself, 99% on RA.    Social Determinants of Health:  Diagnosis or treatment significantly limited by social determinants of health: obesity   Reevaluation: After the interventions noted above, I reevaluated the patient and found that they have improved  Co morbidities that complicate the patient evaluation  Past  Medical History:  Diagnosis Date   Anginal pain (HCC)    Anxiety    Arthritis    Asthma    Bowel obstruction (HCC)    Cardiac arrhythmia    Depression    sees Dr. Clayborn Heron in [place]    Diabetes mellitus    sees Dr. Marlene Lard    Dyspnea    Dysrhythmia    GERD (gastroesophageal reflux disease)    Hyperlipidemia    Hypertension    Hypothyroidism    sees Dr. Leslie Dales   Insomnia    Migraine syndrome    se[facility] Dr[facility] Quinn [facility]lowman at [facility]    Morbid obesity [facility])    Multiple personality (HCC)    Neck pain    Neuropathy associated with endocrine disorder (HCC)    Sleep apnea with use of continuous positive airway pressure (CPAP)    Sleep apnea is resolved due to weight loss. 08/2017   Stroke (HCC)  03/25/2016   left MCA    Stroke [facility]) 04/2016   sees Dr. Quinn Plowman at [facility]       Dispostion: Disposition decision including need for hospitalization was considered, and patient disposition pending at time of sign out.    Final Clinical Impression(s) / ED Diagnoses Final diagnoses:  Left arm swelling        Sloan Leiter, DO 08/22/23 0425

## 2023-08-22 NOTE — ED Triage Notes (Signed)
Pt reports she had IV contrast for a MRI on Friday; tonight she noticed swelling to left elbow area associated with pain. Her IV was left AC.

## 2023-08-22 NOTE — ED Provider Notes (Signed)
Patient returns to the ED for DVT US. No evidence of DVT on [place]. Plan for PCP follow up.    Maia Plan, MD 08/22/23 [phone]

## 2023-08-22 NOTE — Discharge Instructions (Addendum)
It was a pleasure caring for you today in the emergency department.  Please return later for your ultrasound of the left arm, please call [phone] to schedule appointment during normal business hours if you would like to avoid prolonged wait time.   Please return to the emergency department for any worsening or worrisome symptoms.

## 2023-08-27 ENCOUNTER — Encounter (HOSPITAL_BASED_OUTPATIENT_CLINIC_OR_DEPARTMENT_OTHER): Payer: Self-pay | Admitting: Obstetrics and Gynecology

## 2023-08-27 NOTE — H&P (Signed)
Miranda [name] is an 47 y.o. female with abnormal  pap presents for colposcopy.  Pap smear shows LGSIL, HPV negative Previous pap  in 2022-2023 LGSIL, HPV+ STI screening negative  Menstrual History:  Patient's last menstrual period was 02/05/2023.    Past Medical History:  Diagnosis Date   Anginal pain (HCC)    Anxiety    Arthritis    Asthma    Bowel obstruction (HCC)    Cardiac arrhythmia    Depression    sees Dr. Clayborn Heron in [place]    Diabetes mellitus    sees Dr. Marlene Lard    Dyspnea    Dysrhythmia    GERD (gastroesophageal reflux disease)    Hyperlipidemia    Hypertension    Hypothyroidism    sees Dr. Leslie Dales   Insomnia    Migraine syndrome    sees Dr. Quinn Plowman at [facility]    Morbid obesity [facility])    Multiple personality (HCC)    Neck pain    Neuropathy associated with endocrine disorder (HCC)    Sleep apnea with use of continuous positive airway pressure (CPAP)    Sleep apnea is resolved due to weight loss. 08/2017   Stroke (HCC) 03/25/2016   left MCA    Stroke [facility]) 04/2016   sees Dr. Quinn Plowman at [facility]     Past Surgical History:  Procedure Laterality Date   blocked itestinal repair  1975   age 19 months   BREAST BIOPSY Left 2017   CHOLECYSTECTOMY  2011   INDUCED ABORTION  1996   forced abortion   INGUINAL HERNIA REPAIR Left 1980   age 42   KNEE CLOSED REDUCTION Right 08/22/2021   Procedure: RIGHT  KNEE MANIPULATION UNDER ANESTHESIA;  Surgeon: Cammy Copa, MD;  Location: [facility];  Service: Orthopedics;  Laterality: Right;   LAPAROSCOPIC ENDOMETRIOSIS FULGURATION  1998   TOTAL KNEE ARTHROPLASTY Right 07/05/2021   Procedure: TOTAL KNEE ARTHROPLASTY;  Surgeon: Cammy Copa, MD;  Location: [facility] OR;  Service: Orthopedics;  Laterality: Right;   WISDOM TOOTH EXTRACTION  2007    Family History  Adopted: Yes  Problem Relation Age of Onset   Heart disease Other        on both sides of family    Diabetes Other        on both sides of family    Social History:  reports that she has been smoking cigarettes. She started smoking about 5 years ago. She has a 1.4 pack-year smoking history. She has never used smokeless tobacco. She reports current alcohol use. She reports current drug use. Drug: Marijuana.  Allergies:  Allergies  Allergen Reactions   Macrobid [Nitrofurantoin] Hives   Metformin And Related Other (See Comments)    Abdominal cramping    Byetta 10 Mcg Pen [Exenatide] Hives   Clindamycin/Lincomycin Nausea And Vomiting   Geodon [Ziprasidone Hcl]     Body starts shutting down   Lipitor [Atorvastatin]     Per patient this causes cramps   Nsaids     Heartburn   Penicillins Other (See Comments)    Childhood reaction Has patient had a PCN reaction causing immediate rash, facial/tongue/throat swelling, SOB or lightheadedness with hypotension: No Has patient had a PCN reaction causing severe rash involving mucus membranes or skin necrosis: No Has patient had a PCN reaction that required hospitalization No Has patient had a PCN reaction occurring within the last 10 years: No If all of the  above answers are "NO", then may proceed with Cephalosporin use.    Trulicity [Dulaglutide]     Abdominal pain    Avocado Diarrhea and Other (See Comments)    Severe cramping, sweats, and diarrhea in upper GI/stomach   Tramadol Hcl Itching and Rash    Meds:  Amovig, Albuterol, aripiprazole, Baqsimi, bupropion, clopidogrel, doxycyclin, emgality, hydroxyzine, levothyroxine, magnesium, metoprolol, miebo eye drops, nurtec, montelukast, nyamyc, zofran, pantoprazole, rosuvastatin, symbicort, topiramate, trazodone, valtrex, zyrtec  Review of Systems   Last menstrual period 02/05/2023. Physical Exam Gen - NAD CV - RRR Lungs - clear Abd - soft, NT PV - uterus mobile, NT.  No adnexal mass or pain   Assessment/Plan: Cervical dysplasia Recommend colposcopy and pt unable to tolerate in the  office Plan for colposcopy with biopsy and ECC  Miranda [name] 08/27/2023, 9:40 AM

## 2023-08-27 NOTE — Progress Notes (Signed)
Spoke w/ via phone for pre-op interview--- pt Lab needs dos----    cbc, t&s, istat, EKG, urine preg     Lab results------ no COVID test -----patient states asymptomatic no test needed Arrive at ------- 0530 on 08-29-2023 NPO after MN NO Solid Food.  Clear liquids from MN until--- 0430 Med rec completed Medications to take morning of surgery ----- toprol, hydroxyzine, protonix, symbicort inhaler Diabetic medication ----- pt has insulin pump w/ novolog will be leaving it on  Patient instructed no nail polish to be worn day of surgery Patient instructed to bring photo id and insurance card day of surgery Patient aware to have Driver (ride ) / caregiver    for 24 hours after surgery - friend, [place] Patient Special Instructions ----- asked to bring rescue inhaler dos Pre-Op special Instructions -----  waiting for pt's neurology clearance for plavix, spoke w/ Corrie Dandy with morning whom had sent urgent request to pt's neurologist, Dr Quinn Plowman on Friday 10/ 25. Pt stated last dose on Thursday 10/ 24/ 2024 .   Pt stated she had piercing w/ plastic in ear, told needed to be removed , pt verbalized understanding. Pt has Dexcom 7 on right arm, per pt will be changing to left upper arm tomorrow. Patient verbalized understanding of instructions that were given at this phone interview. Patient denies chest pain, sob, fever, cough at the interview.    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;  IDDM2 insulin pump;  multiple personality disorder;  OSA does uses cpap  PCP:  Dr Rosey Bath (lov 08-23-2023) Cardiologist :  Dr Rudolpho Sevin Theron Arista 05-24-2023) Neurologist:  Dr Acquanetta Belling (lov 07-31-2023) Endocrinologist:  Rueben Bash PA (lov 08-21-2023) Chest x-ray : 08-10-2023 in [facility] EKG : 02-01-2023 care everywhere w/ no tracing Echo : 02-11-2021 in [facility] Stress test: 06-24-2021 in [facility] done @ [facility] no results;  stress echo/ ETT in [facility] 01-08-2019 Cardiac Cath : no Activity  level:  pt denies sob w/ activity Sleep Study/ CPAP : yes/ no (pt stated has re-study scheduled in 12/ 2024 ) Fasting Blood Sugar :  150-175    / Checks Blood Sugar -- times a day:  multiple w/ GCM Blood Thinner/ Instructions /Last Dose:Plavix ASA / Instructions/ Last Dose : no Pt was given instructions pt stop by Dr Renaldo Fiddler office 5 days prior to surgery.  Pt stated last dose 08-23-2023.  Case was posted 08-24-2023.  Clearance pending on 08-27-2023

## 2023-08-28 NOTE — Anesthesia Preprocedure Evaluation (Signed)
Anesthesia Evaluation  Patient identified by MRN, date of birth, ID band Patient awake    Reviewed: Allergy & Precautions, NPO status , Patient's Chart, lab work & pertinent test results  Airway Mallampati: II  TM Distance: >3 FB Neck ROM: Full    Dental no notable dental hx.    Pulmonary asthma , sleep apnea , Current Smoker   Pulmonary exam normal        Cardiovascular hypertension, Pt. on medications and Pt. on home beta blockers  Rhythm:Regular Rate:Normal     Neuro/Psych  Headaches  Anxiety Depression    CVA, No Residual Symptoms    GI/Hepatic ,GERD  Medicated,,  Endo/Other  diabetes, Type 2, Insulin DependentHypothyroidism    Renal/GU negative Renal ROS  negative genitourinary   Musculoskeletal  (+) Arthritis , Osteoarthritis,    Abdominal Normal abdominal exam  (+)   Peds  Hematology   Anesthesia Other Findings   Reproductive/Obstetrics                             Anesthesia Physical Anesthesia Plan  ASA: 3  Anesthesia Plan: MAC   Post-op Pain Management: Tylenol PO (pre-op)*   Induction: Intravenous  PONV Risk Score and Plan: 2 and Ondansetron, Dexamethasone, Propofol infusion, Midazolam and Treatment may vary due to age or medical condition  Airway Management Planned: Simple Face Mask and Nasal Cannula  Additional Equipment: None  Intra-op Plan:   Post-operative Plan:   Informed Consent: I have reviewed the patients History and Physical, chart, labs and discussed the procedure including the risks, benefits and alternatives for the proposed anesthesia with the patient or authorized representative who has indicated his/her understanding and acceptance.     Dental advisory given  Plan Discussed with: CRNA  Anesthesia Plan Comments:        Anesthesia Quick Evaluation

## 2023-08-29 ENCOUNTER — Other Ambulatory Visit: Payer: Self-pay

## 2023-08-29 ENCOUNTER — Encounter (HOSPITAL_BASED_OUTPATIENT_CLINIC_OR_DEPARTMENT_OTHER): Payer: Self-pay | Admitting: Obstetrics and Gynecology

## 2023-08-29 ENCOUNTER — Ambulatory Visit (HOSPITAL_BASED_OUTPATIENT_CLINIC_OR_DEPARTMENT_OTHER): Payer: Self-pay | Admitting: Anesthesiology

## 2023-08-29 ENCOUNTER — Ambulatory Visit (HOSPITAL_BASED_OUTPATIENT_CLINIC_OR_DEPARTMENT_OTHER)
Admission: RE | Admit: 2023-08-29 | Discharge: 2023-08-29 | Disposition: A | Discharge status: Home / Self Care | Payer: BC Managed Care – PPO | Attending: Obstetrics and Gynecology | Admitting: Obstetrics and Gynecology

## 2023-08-29 ENCOUNTER — Encounter (HOSPITAL_BASED_OUTPATIENT_CLINIC_OR_DEPARTMENT_OTHER)
Admission: RE | Disposition: A | Discharge status: Home / Self Care | Payer: Self-pay | Source: Home / Self Care | Attending: Obstetrics and Gynecology

## 2023-08-29 DIAGNOSIS — F418 Other specified anxiety disorders: Secondary | ICD-10-CM | POA: Diagnosis not present

## 2023-08-29 DIAGNOSIS — I10 Essential (primary) hypertension: Secondary | ICD-10-CM | POA: Insufficient documentation

## 2023-08-29 DIAGNOSIS — G4730 Sleep apnea, unspecified: Secondary | ICD-10-CM | POA: Diagnosis not present

## 2023-08-29 DIAGNOSIS — F17210 Nicotine dependence, cigarettes, uncomplicated: Secondary | ICD-10-CM | POA: Diagnosis not present

## 2023-08-29 DIAGNOSIS — Z01818 Encounter for other preprocedural examination: Secondary | ICD-10-CM

## 2023-08-29 DIAGNOSIS — R87612 Low grade squamous intraepithelial lesion on cytologic smear of cervix (LGSIL): Secondary | ICD-10-CM

## 2023-08-29 DIAGNOSIS — Z794 Long term (current) use of insulin: Secondary | ICD-10-CM | POA: Insufficient documentation

## 2023-08-29 DIAGNOSIS — E119 Type 2 diabetes mellitus without complications: Secondary | ICD-10-CM | POA: Insufficient documentation

## 2023-08-29 DIAGNOSIS — Z8673 Personal history of transient ischemic attack (TIA), and cerebral infarction without residual deficits: Secondary | ICD-10-CM | POA: Insufficient documentation

## 2023-08-29 DIAGNOSIS — K219 Gastro-esophageal reflux disease without esophagitis: Secondary | ICD-10-CM | POA: Diagnosis not present

## 2023-08-29 DIAGNOSIS — N870 Mild cervical dysplasia: Secondary | ICD-10-CM | POA: Diagnosis present

## 2023-08-29 DIAGNOSIS — E039 Hypothyroidism, unspecified: Secondary | ICD-10-CM | POA: Insufficient documentation

## 2023-08-29 HISTORY — DX: Personal history of other diseases of the digestive system: Z87.19

## 2023-08-29 HISTORY — DX: Migraine without aura, not intractable, without status migrainosus: G43.009

## 2023-08-29 HISTORY — DX: Major depressive disorder, single episode, unspecified: F32.9

## 2023-08-29 HISTORY — DX: Other specified health status: Z78.9

## 2023-08-29 HISTORY — DX: Unspecified osteoarthritis, unspecified site: M19.90

## 2023-08-29 HISTORY — DX: Long term (current) use of anticoagulants: Z79.01

## 2023-08-29 HISTORY — DX: Polycystic ovarian syndrome: E28.2

## 2023-08-29 HISTORY — DX: Type 2 diabetes mellitus with diabetic autonomic (poly)neuropathy: E11.43

## 2023-08-29 HISTORY — DX: Generalized anxiety disorder: F41.1

## 2023-08-29 HISTORY — DX: Ventricular premature depolarization: I49.3

## 2023-08-29 HISTORY — DX: Obstructive sleep apnea (adult) (pediatric): G47.33

## 2023-08-29 HISTORY — DX: Presence of insulin pump (external) (internal): Z96.41

## 2023-08-29 HISTORY — DX: Presence of spectacles and contact lenses: Z97.3

## 2023-08-29 HISTORY — DX: Irritable bowel syndrome with diarrhea: K58.0

## 2023-08-29 HISTORY — DX: Simple chronic bronchitis: J41.0

## 2023-08-29 HISTORY — DX: Nonalcoholic steatohepatitis (NASH): K75.81

## 2023-08-29 HISTORY — PX: COLPOSCOPY: SHX161

## 2023-08-29 HISTORY — DX: Encounter for adoption services: Z02.82

## 2023-08-29 HISTORY — DX: Dissociative identity disorder: F44.81

## 2023-08-29 HISTORY — DX: Personal history of other infectious and parasitic diseases: Z86.19

## 2023-08-29 LAB — POCT I-STAT, CHEM 8
BUN: 9 mg/dL (ref 6–20)
Calcium, Ion: 1.21 mmol/L (ref 1.15–1.40)
Chloride: 104 mmol/L (ref 98–111)
Creatinine, Ser: 0.7 mg/dL (ref 0.44–1.00)
Glucose, Bld: 97 mg/dL (ref 70–99)
HCT: 42 % (ref 36.0–46.0)
Hemoglobin: 14.3 g/dL (ref 12.0–15.0)
Potassium: 3.3 mmol/L — ABNORMAL LOW (ref 3.5–5.1)
Sodium: 138 mmol/L (ref 135–145)
TCO2: 20 mmol/L — ABNORMAL LOW (ref 22–32)

## 2023-08-29 LAB — CBC
HCT: 41.1 % (ref 36.0–46.0)
Hemoglobin: 14.3 g/dL (ref 12.0–15.0)
MCH: 32.1 pg (ref 26.0–34.0)
MCHC: 34.8 g/dL (ref 30.0–36.0)
MCV: 92.4 fL (ref 80.0–100.0)
Platelets: 173 10*3/uL (ref 150–400)
RBC: 4.45 MIL/uL (ref 3.87–5.11)
RDW: 12.8 % (ref 11.5–15.5)
WBC: 9.1 10*3/uL (ref 4.0–10.5)
nRBC: 0 % (ref 0.0–0.2)

## 2023-08-29 LAB — GLUCOSE, CAPILLARY: Glucose-Capillary: 102 mg/dL — ABNORMAL HIGH (ref 70–99)

## 2023-08-29 LAB — POCT PREGNANCY, URINE: Preg Test, Ur: NEGATIVE

## 2023-08-29 LAB — TYPE AND SCREEN
ABO/RH(D): A NEG
Antibody Screen: NEGATIVE

## 2023-08-29 LAB — ABO/RH: ABO/RH(D): A NEG

## 2023-08-29 SURGERY — COLPOSCOPY
Anesthesia: Monitor Anesthesia Care

## 2023-08-29 MED ORDER — ACETAMINOPHEN 500 MG PO TABS
ORAL_TABLET | ORAL | Status: AC
Start: 2023-08-29 — End: ?
  Filled 2023-08-29: qty 2

## 2023-08-29 MED ORDER — ACETIC ACID 4% SOLUTION
Status: DC | PRN
  Administered 2023-08-29: 1 via TOPICAL

## 2023-08-29 MED ORDER — LACTATED RINGERS IV SOLN: INTRAVENOUS | Status: DC

## 2023-08-29 MED ORDER — KETOROLAC TROMETHAMINE 30 MG/ML IJ SOLN
INTRAMUSCULAR | Status: AC
Start: 2023-08-29 — End: ?
  Filled 2023-08-29: qty 1

## 2023-08-29 MED ORDER — FENTANYL CITRATE (PF) 100 MCG/2ML IJ SOLN: 25.0000 ug | INTRAMUSCULAR | Status: DC | PRN

## 2023-08-29 MED ORDER — LIDOCAINE HCL (PF) 2 % IJ SOLN
INTRAMUSCULAR | Status: AC
  Filled 2023-08-29: qty 5

## 2023-08-29 MED ORDER — DEXAMETHASONE SODIUM PHOSPHATE 10 MG/ML IJ SOLN
INTRAMUSCULAR | Status: AC
Start: 2023-08-29 — End: ?
  Filled 2023-08-29: qty 1

## 2023-08-29 MED ORDER — PROPOFOL 10 MG/ML IV BOLUS
INTRAVENOUS | Status: AC
  Filled 2023-08-29: qty 20

## 2023-08-29 MED ORDER — PROPOFOL 500 MG/50ML IV EMUL
INTRAVENOUS | Status: DC | PRN
  Administered 2023-08-29: 50 ug/kg/min via INTRAVENOUS

## 2023-08-29 MED ORDER — ONDANSETRON HCL 4 MG/2ML IJ SOLN
INTRAMUSCULAR | Status: AC
  Filled 2023-08-29: qty 2

## 2023-08-29 MED ORDER — FENTANYL CITRATE (PF) 100 MCG/2ML IJ SOLN
INTRAMUSCULAR | Status: DC | PRN
  Administered 2023-08-29 (×2): 25 ug via INTRAVENOUS

## 2023-08-29 MED ORDER — MIDAZOLAM HCL 2 MG/2ML IJ SOLN
INTRAMUSCULAR | Status: AC
  Filled 2023-08-29: qty 2

## 2023-08-29 MED ORDER — MIDAZOLAM HCL 5 MG/5ML IJ SOLN
INTRAMUSCULAR | Status: DC | PRN
  Administered 2023-08-29: 2 mg via INTRAVENOUS

## 2023-08-29 MED ORDER — PROPOFOL 1000 MG/100ML IV EMUL
INTRAVENOUS | Status: AC
  Filled 2023-08-29: qty 100

## 2023-08-29 MED ORDER — ONDANSETRON HCL 4 MG/2ML IJ SOLN
INTRAMUSCULAR | Status: DC | PRN
  Administered 2023-08-29: 4 mg via INTRAVENOUS

## 2023-08-29 MED ORDER — ACETAMINOPHEN 500 MG PO TABS
1000.0000 mg | ORAL_TABLET | Freq: Once | ORAL | Status: AC
Start: 2023-08-29 — End: 2023-08-29
  Administered 2023-08-29: 1000 mg via ORAL

## 2023-08-29 MED ORDER — FENTANYL CITRATE (PF) 100 MCG/2ML IJ SOLN
INTRAMUSCULAR | Status: AC
  Filled 2023-08-29: qty 2

## 2023-08-29 MED ORDER — DROPERIDOL 2.5 MG/ML IJ SOLN: 0.6250 mg | Freq: Once | INTRAMUSCULAR | Status: DC | PRN

## 2023-08-29 SURGICAL SUPPLY — 23 items
APL SWBSTK 6 STRL LF DISP (MISCELLANEOUS) ×1
APPLICATOR COTTON TIP 6 STRL (MISCELLANEOUS) ×1 IMPLANT
APPLICATOR COTTON TIP 6IN STRL (MISCELLANEOUS) ×1
ELECT BALL LEEP 5MM RED (ELECTRODE) IMPLANT
ELECT LOOP LEEP RND 10X10 YLW (CUTTING LOOP)
ELECT LOOP LEEP RND 15X12 GRN (CUTTING LOOP)
ELECT LOOP LEEP RND 20X12 WHT (CUTTING LOOP)
ELECT REM PT RETURN 9FT ADLT (ELECTROSURGICAL) ×1
ELECTRODE LOOP LP RND 10X10YLW (CUTTING LOOP) IMPLANT
ELECTRODE LOOP LP RND 15X12GRN (CUTTING LOOP) IMPLANT
ELECTRODE LOOP LP RND 20X12WHT (CUTTING LOOP) IMPLANT
ELECTRODE REM PT RTRN 9FT ADLT (ELECTROSURGICAL) ×1 IMPLANT
EXTENDER ELECT LOOP LEEP 10CM (CUTTING LOOP) IMPLANT
GLOVE BIO SURGEON STRL SZ 6.5 (GLOVE) ×1 IMPLANT
GLOVE BIOGEL PI IND STRL 7.0 (GLOVE) ×2 IMPLANT
GOWN STRL REUS W/TWL LRG LVL3 (GOWN DISPOSABLE) ×2 IMPLANT
KIT TURNOVER CYSTO (KITS) ×1 IMPLANT
NS IRRIG 1000ML POUR BTL (IV SOLUTION) ×1 IMPLANT
PAD OB MATERNITY 4.3X12.25 (PERSONAL CARE ITEMS) ×1 IMPLANT
PENCIL SMOKE EVACUATOR (MISCELLANEOUS) ×1 IMPLANT
SCOPETTES 8 STERILE (MISCELLANEOUS) ×2 IMPLANT
SLEEVE SCD COMPRESS KNEE MED (STOCKING) ×1 IMPLANT
TOWEL OR 17X24 6PK STRL BLUE (TOWEL DISPOSABLE) ×2 IMPLANT

## 2023-08-29 NOTE — Op Note (Signed)
Pre Op dx:  LGSIL, h/o HPV Post op dx:  same Procedure:  colposcopy with biopsy and ECC Surgeon:  Zelphia Cairo, MD Anesthesia:  MAC Specimens:  cervical biopsy at 7 & 9 o'clock.  Endocervical currettings Complications:  none  Pt was taken to the OR and placed in dorsal lithotomy position.  Cervix was washed with acetic acid.  Colposcopy was done and was satisfactory.  Acetowhite changes noted at 7 and 9 o'clock.  Biopsies taken of acetowhite areas.  ECC done.  Biopsies and ECC passed off and sent to pathology.  Cervix was hemostatic with monsels solution.  She was taken to the recovery room in stable condition.

## 2023-08-29 NOTE — Progress Notes (Signed)
No change to H&P Procedure discussed in detail Questions answered Informed consent obtained

## 2023-08-29 NOTE — Discharge Instructions (Addendum)
FU office 2-3 weeks for postop appointment.  Call the office [phone] for an appointment.  Personal Hygiene: Use pads not tampons x 1week You may shower, no tub baths or pools for 2-3 weeks Wipe from front to back when using restroom  Activity: Do not drive or operate any equipment for 24 hrs.   Do not rest in bed all day Walking is encouraged Walk up and down stairs slowly You may return to your normal activity in 1-2 days  Sexual Activity:  No intercourse for 2 weeks after the procedure.  You may resume your plavix Diet: Eat a light meal as desired this evening.  You may resume your usual diet tomorrow.  Return to work:  You may resume your work activities after 1 day  What to expect:  Expect to have vaginal bleeding/discharge for 2-3 days and spotting or light bleeding for 10-14 days.  It is not unusual to have soreness for 1-2 weeks.  You may have a slight burning sensation when you urinate for the first few days.  You may start your menses in 2-6 weeks.  Mild cramps may continue for a couple of days.    Call your doctor:   Excessive bleeding, saturating a pad every hour Inability to urinate 6 hours after discharge Pain not relieved with pain medications Fever of 100.4 or greater   May resume Plavix 08/29/2023 per Dr. Vickey Sages  No acetaminophen/Tylenol until after 12:30pm today if needed for pain.   Post Anesthesia Home Care Instructions  Activity: Get plenty of rest for the remainder of the day. A responsible individual must stay with you for 24 hours following the procedure.  For the next 24 hours, DO NOT: -Drive a car -Advertising copywriter -Drink alcoholic beverages -Take any medication unless instructed by your physician -Make any legal decisions or sign important papers.  Meals: Start with liquid foods such as gelatin or soup. Progress to regular foods as tolerated. Avoid greasy, spicy, heavy foods. If nausea and/or vomiting occur, drink only clear liquids until the  nausea and/or vomiting subsides. Call your physician if vomiting continues.  Special Instructions/Symptoms: Your throat may feel dry or sore from the anesthesia or the breathing tube placed in your throat during surgery. If this causes discomfort, gargle with warm salt water. The discomfort should disappear within 24 hours.

## 2023-08-29 NOTE — Transfer of Care (Signed)
Immediate Anesthesia Transfer of Care Note  Patient: Miranda [name]  Procedure(s) Performed: Procedure(s) (LRB): COLPOSCOPY (N/A)  Patient Location: PACU  Anesthesia Type: MAC  Level of Consciousness: awake, sedated, patient cooperative and responds to stimulation  Airway & Oxygen Therapy: Patient Spontanous Breathing and Patient connected to [place] oxygen  Post-op Assessment: Report given to PACU RN, Post -op Vital signs reviewed and stable and Patient moving all extremities  Post vital signs: Reviewed and stable  Complications: No apparent anesthesia complications

## 2023-08-29 NOTE — Anesthesia Procedure Notes (Signed)
Procedure Name: MAC Date/Time: 08/29/2023 7:30 AM  Performed by: Jessica Priest, CRNAPre-anesthesia Checklist: Timeout performed, Patient being monitored, Suction available, Emergency Drugs available and Patient identified Patient Re-evaluated:Patient Re-evaluated prior to induction Oxygen Delivery Method: Simple face mask Preoxygenation: Pre-oxygenation with 100% oxygen Induction Type: IV induction Placement Confirmation: CO2 detector and positive ETCO2

## 2023-08-29 NOTE — Anesthesia Postprocedure Evaluation (Signed)
Anesthesia Post Note  Patient: Nuala Woehrle Hinderer  Procedure(s) Performed: COLPOSCOPY     Patient location during evaluation: PACU Anesthesia Type: MAC Level of consciousness: awake and alert Pain management: pain level controlled Vital Signs Assessment: post-procedure vital signs reviewed and stable Respiratory status: spontaneous breathing, nonlabored ventilation, respiratory function stable and patient connected to nasal cannula oxygen Cardiovascular status: stable and blood pressure returned to baseline Postop Assessment: no apparent nausea or vomiting Anesthetic complications: no   No notable events documented.  Last Vitals:  Vitals:   08/29/23 0830 08/29/23 0851  BP:  106/77  Pulse:  72  Resp:  15  Temp: (!) 36.3 C 36.4 C  SpO2:  98%    Last Pain:  Vitals:   08/29/23 0851  TempSrc:   PainSc: 0-No pain                 Nelle Don Ridhima Golberg

## 2023-08-30 ENCOUNTER — Encounter (HOSPITAL_BASED_OUTPATIENT_CLINIC_OR_DEPARTMENT_OTHER): Payer: Self-pay | Admitting: Obstetrics and Gynecology

## 2023-08-30 LAB — SURGICAL PATHOLOGY

## 2023-09-12 ENCOUNTER — Encounter: Payer: Self-pay | Admitting: Family Medicine

## 2023-09-12 ENCOUNTER — Ambulatory Visit
Admission: RE | Admit: 2023-09-12 | Discharge: 2023-09-12 | Disposition: A | Discharge status: Home / Self Care | Payer: BC Managed Care – PPO | Source: Ambulatory Visit | Attending: Family Medicine | Admitting: Family Medicine

## 2023-09-12 ENCOUNTER — Ambulatory Visit (INDEPENDENT_AMBULATORY_CARE_PROVIDER_SITE_OTHER): Payer: BC Managed Care – PPO | Admitting: Family Medicine

## 2023-09-12 ENCOUNTER — Ambulatory Visit (HOSPITAL_COMMUNITY)
Admission: RE | Admit: 2023-09-12 | Discharge: 2023-09-12 | Disposition: A | Discharge status: Home / Self Care | Payer: BC Managed Care – PPO | Source: Ambulatory Visit | Attending: Family Medicine | Admitting: Family Medicine

## 2023-09-12 VITALS — BP 108/69 | Ht 61.75 in | Wt 206.0 lb

## 2023-09-12 DIAGNOSIS — M79602 Pain in left arm: Secondary | ICD-10-CM

## 2023-09-12 MED ORDER — PREDNISONE 10 MG PO TABS: ORAL_TABLET | ORAL | 0 refills | Status: DC
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
## 2023-09-17 ENCOUNTER — Encounter: Payer: Self-pay | Admitting: Family Medicine

## 2023-10-04 ENCOUNTER — Encounter: Payer: Self-pay | Admitting: Family Medicine

## 2023-10-08 ENCOUNTER — Other Ambulatory Visit: Payer: Self-pay

## 2023-10-08 DIAGNOSIS — M79602 Pain in left arm: Secondary | ICD-10-CM

## 2023-10-09 MED ORDER — PREDNISONE 10 MG PO TABS: ORAL_TABLET | ORAL | 0 refills | Status: DC

## 2023-10-15 NOTE — Addendum Note (Signed)
Addended by: Rutha Bouchard E on: 10/15/2023 11:03 AM   Modules accepted: Orders

## 2023-11-02 ENCOUNTER — Ambulatory Visit
Admission: RE | Admit: 2023-11-02 | Discharge: 2023-11-02 | Disposition: A | Discharge status: Home / Self Care | Payer: BC Managed Care – PPO | Source: Ambulatory Visit | Attending: Family Medicine | Admitting: Family Medicine

## 2023-11-02 DIAGNOSIS — M79602 Pain in left arm: Secondary | ICD-10-CM

## 2023-11-08 ENCOUNTER — Encounter: Payer: Self-pay | Admitting: Family Medicine

## 2023-11-21 ENCOUNTER — Telehealth: Payer: Self-pay

## 2023-11-21 DIAGNOSIS — M79602 Pain in left arm: Secondary | ICD-10-CM

## 2023-11-21 DIAGNOSIS — R200 Anesthesia of skin: Secondary | ICD-10-CM

## 2023-11-21 NOTE — Telephone Encounter (Signed)
Referral placed to [facility]. Pt instructed to notify us if they have not contacted her within one week.

## 2023-11-26 NOTE — Telephone Encounter (Signed)
Pt requested referral to [facility] as they could get her in sooner.

## 2023-11-26 NOTE — Addendum Note (Signed)
Addended by: Rutha Bouchard E on: 11/26/2023 03:04 PM   Modules accepted: Orders

## 2023-11-27 ENCOUNTER — Encounter: Payer: Self-pay | Admitting: Physical Medicine & Rehabilitation

## 2023-11-27 ENCOUNTER — Other Ambulatory Visit: Payer: Self-pay

## 2023-11-27 DIAGNOSIS — M79601 Pain in right arm: Secondary | ICD-10-CM

## 2023-11-27 DIAGNOSIS — R200 Anesthesia of skin: Secondary | ICD-10-CM

## 2023-11-30 ENCOUNTER — Ambulatory Visit: Payer: BC Managed Care – PPO | Admitting: Obstetrics

## 2023-12-03 ENCOUNTER — Ambulatory Visit: Payer: BC Managed Care – PPO | Admitting: Family Medicine

## 2023-12-03 ENCOUNTER — Encounter: Payer: Self-pay | Admitting: Family Medicine

## 2023-12-03 VITALS — BP 119/63 | Ht 61.75 in | Wt 208.0 lb

## 2023-12-03 DIAGNOSIS — M1712 Unilateral primary osteoarthritis, left knee: Secondary | ICD-10-CM

## 2023-12-03 MED ORDER — HYALURONAN 30 MG/2ML IX SOSY
30.0000 mg | PREFILLED_SYRINGE | Freq: Once | INTRA_ARTICULAR | Status: AC
  Administered 2023-12-03: 30 mg via INTRA_ARTICULAR

## 2023-12-03 NOTE — Progress Notes (Signed)
PCP: Herma Carson, MD  Subjective:   HPI: Patient is a 48 y.o. female here for left knee viscosupplementation.  Patient reports knee has been bothering her for months but she's been trying to push through it. Here for visco series.  Past Medical History:  Diagnosis Date   Adopted person    per pt unknown biological medical history   Anticoagulant long-term use    plavix--- managed by  ?neurology or pcp  (stroke prevention)   Asthma    Cervical dysplasia 07/2023   low grade   Diabetic gastroparesis (HCC)    Dyspnea    08-27-2023  pt stated able to do stairs with minimal sob, household chores w/ no sob   GAD (generalized anxiety disorder)    GERD (gastroesophageal reflux disease)    History of CVA (cerebrovascular accident) 03/25/2016   left MCA ---  without resiudals   History of CVA (cerebrovascular accident) 04/2016   neurologist--  Dr. Quinn Plowman ([facility]);;  second cva treated with tpn,  without residials   History of HPV infection    History of small bowel obstruction    age 39 months s/p surgical intervention   Hyperlipidemia    Hypertension    Hypothyroidism    followed by endocrinologist   Insomnia    Insulin dependent type 2 diabetes mellitus (HCC) 2004   endocrinology--- williams breeante PA;  has insulin pump w/ novolog   Insulin pump in place    Irritable bowel syndrome with diarrhea    MDD (major depressive disorder)    sees Dr. Clayborn Heron in [place]    Migraine without aura and without status migrainosus, not intractable    sees Dr. 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   NASH (nonalcoholic steatohepatitis)    followed by [facility]-- dr sheehid   Neuropathy associated with endocrine disorder (HCC)    OA (osteoarthritis)    knees, shoulders, elbows   OSA (obstructive sleep apnea)    08-27-2023  per pt last study approx 4 yrs ago told moderate osa,  no longer uses  cpap , not functional , has schduled to have study done 12/ 2024 does not think she has osa any more   PCOS (polycystic ovarian syndrome)    PVC's (premature ventricular contractions)    EP cardiologly-- dr Rudolpho Sevin   Smokers' cough (HCC)    nonproductive   Wears contact lenses     Current Outpatient Medications on File Prior to Visit  Medication Sig Dispense Refill   acetaminophen (TYLENOL) 500 MG tablet Take 1,000 mg by mouth every 6 (six) hours as needed.     albuterol (PROAIR HFA) 108 (90 Base) MCG/ACT inhaler Inhale 2 puffs into the lungs every 4 (four) hours as needed for wheezing. 8.5 g 11   ARIPiprazole (ABILIFY) 30 MG tablet Take 30 mg by mouth every evening.     baclofen (LIORESAL) 10 MG tablet Take 10 mg by mouth every 4 (four) hours as needed (headaches).     budesonide-formoterol (SYMBICORT) 160-4.5 MCG/ACT inhaler Inhale 2 puffs into the lungs 2 (two) times daily. 1 Inhaler 11   buPROPion (WELLBUTRIN XL) 150 MG 24 hr tablet Take 350 mg by mouth daily.     clopidogrel (PLAVIX) 75 MG tablet TAKE ONE TABLET BY MOUTH DAILY (Patient taking differently: Take 75 mg by mouth daily.) 90 tablet 0   cyclobenzaprine (FLEXERIL) 5 MG  tablet Take 5 mg by mouth 3 (three) times daily as needed for muscle spasms.     diclofenac Sodium (VOLTAREN) 1 % GEL Apply 1 application topically 2 (two) times daily as needed (pain).     famotidine (PEPCID) 20 MG tablet Take 40 mg by mouth as needed for heartburn or indigestion (per pt takes when taking ibuprofen).     fluticasone (FLONASE) 50 MCG/ACT nasal spray Place 1 spray into both nostrils 2 (two) times daily.     gabapentin (NEURONTIN) 400 MG capsule TAKE ONE CAPSULE BY MOUTH THREE TIMES A DAY (Patient not taking: Reported on 08/27/2023) 90 capsule 1   Galcanezumab-gnlm (EMGALITY) 120 MG/ML SOAJ Inject into the skin every 30 (thirty) days.     Glucagon (BAQSIMI ONE PACK) 3 MG/DOSE POWD Place 3 mg into the nose daily as needed (low blood sugar).      hydrOXYzine (VISTARIL) 50 MG capsule Take 50 mg by mouth 3 (three) times daily. Per pt can take up to TID,  but currently just takes regularly qhs     ibuprofen (ADVIL) 200 MG tablet Take 800 mg by mouth every 8 (eight) hours as needed.     insulin aspart (NOVOLOG) 100 UNIT/ML injection Use in the insulin pump as directed (Patient taking differently: as directed. Use in the insulin pump as directed) 10 mL 0   Insulin Glargine (BASAGLAR KWIKPEN) 100 UNIT/ML Inject 30 Units into the skin as directed. Per pt when pump being used , as directed by provider do glargine daily (Patient not taking: Reported on 08/27/2023)     levothyroxine (SYNTHROID, LEVOTHROID) 125 MCG tablet Take 1 tablet (125 mcg total) by mouth daily before breakfast. 90 tablet 3   linaclotide (LINZESS) 145 MCG CAPS capsule Take 145 mcg by mouth daily before breakfast.     loratadine (CLARITIN) 10 MG tablet Take 10 mg by mouth daily as needed for allergies.     Magnesium 500 MG CAPS Take 1 capsule by mouth daily.     Menthol, Topical Analgesic, (BIOFREEZE) 10 % LIQD Apply 1 application topically 2 (two) times daily as needed (pain).     metoprolol succinate (TOPROL-XL) 25 MG 24 hr tablet Take 0.5 tablets (12.5 mg total) by mouth daily. 30 tablet 0   montelukast (SINGULAIR) 10 MG tablet Take 10 mg by mouth at bedtime.     Multiple Vitamins-Minerals (IMMUNE SUPPORT VITAMIN C PO) Take 1 capsule by mouth daily.     nystatin [facility]) powder Apply topically QID. (Patient not taking: Reported on 08/27/2023) 60 g 0   ondansetron (ZOFRAN-ODT) 4 MG disintegrating tablet Take 1 tablet (4 mg total) by mouth every 8 (eight) hours as needed. 12 tablet 1   pantoprazole (PROTONIX) 40 MG tablet TAKE ONE TABLET BY MOUTH TWICE A DAY (Patient taking differently: Take 40 mg by mouth 2 (two) times daily.) 30 tablet 1   Perfluorohexyloctane (MIEBO) 1.338 GM/ML SOLN Apply 1 drop to eye 2 (two) times daily.     polyethylene glycol powder (GLYCOLAX/MIRALAX) 17  GM/SCOOP powder Use 1 capful twice daily in 8 ounces of fluid (Patient taking differently: Take 17 g by mouth daily. Use 1 capful twice daily in 8 ounces of fluid) 510 g 3   prazosin (MINIPRESS) 5 MG capsule Take 10 mg by mouth at bedtime.     predniSONE (DELTASONE) 10 MG tablet Use as directed per doctors orders for the next 6 days. 21 tablet 0   Prenatal Vit-Fe Fumarate-FA (MULTIVITAMIN-PRENATAL) 27-0.8 MG TABS tablet Take  1 tablet by mouth daily at 12 noon.     promethazine (PHENERGAN) 25 MG tablet Take 25 mg by mouth every 6 (six) hours as needed for nausea or vomiting.     Rimegepant Sulfate (NURTEC) 75 MG TBDP Take 75 mg by mouth daily as needed (migraines).     rosuvastatin (CRESTOR) 20 MG tablet Take 1 tablet (20 mg total) by mouth daily. (Patient taking differently: Take 20 mg by mouth at bedtime.) 30 tablet 6   terconazole (TERAZOL 7) 0.4 % vaginal cream Place 1 applicator vaginally at bedtime as needed (yeast infections).     Tirzepatide (MOUNJARO [place]) Inject 5 mg into the skin once a week. wedneday's (Patient not taking: Reported on 08/27/2023)     topiramate (TOPAMAX) 100 MG tablet Take 100 mg by mouth 2 (two) times daily.      traZODone (DESYREL) 100 MG tablet Take 200 mg by mouth at bedtime.     valACYclovir (VALTREX) 1000 MG tablet Take 1 tablet (1,000 mg total) by mouth 3 (three) times daily. (Patient not taking: Reported on 08/27/2023) 30 tablet 0   VITAMIN A PO Take 1 tablet by mouth daily.     No current facility-administered medications on file prior to visit.    Past Surgical History:  Procedure Laterality Date   CHOLECYSTECTOMY, LAPAROSCOPIC  05/13/2010   @[facility]  by dr m. wakefield   COLECTOMY  1975   age 58 months old (bowel obstruction)   COLPOSCOPY N/A 08/29/2023   Procedure: COLPOSCOPY;  Surgeon: Zelphia Cairo, MD;  Location: [facility] [place];  Service: Gynecology;  Laterality: N/A;   INGUINAL HERNIA REPAIR Left 1980   age 1   KNEE CLOSED REDUCTION Right  08/22/2021   Procedure: RIGHT  KNEE MANIPULATION UNDER ANESTHESIA;  Surgeon: Cammy Copa, MD;  Location: [facility];  Service: Orthopedics;  Laterality: Right;   LAPAROSCOPIC ENDOMETRIOSIS FULGURATION  1998   LOOP RECORDER IMPLANT  02/05/2017   @[facility]  by dr f. al-khori;    (08-24-2023  per pt removed approx 2022)   TOTAL KNEE ARTHROPLASTY Right 07/05/2021   Procedure: TOTAL KNEE ARTHROPLASTY;  Surgeon: Cammy Copa, MD;  Location[facility];  Service: Orthopedics;  Laterality: Right;   WISDOM TOOTH EXTRACTION  2007    Allergies  Allergen Reactions   Macrobid [Nitrofurantoin] Hives and Shortness Of Breath   Metformin And Related Other (See Comments)    Abdominal cramping    Byetta 10 Mcg Pen [Exenatide] Hives   Clindamycin/Lincomycin Nausea And Vomiting   Geodon [Ziprasidone Hcl]     Body starts shutting down   Hydromorphone Itching    Per pt severe   Lipitor [Atorvastatin]     Per patient this causes cramps   Nsaids     Heartburn   Penicillins Other (See Comments)    Childhood reaction Has patient had a PCN reaction causing immediate rash, facial/tongue/throat swelling, SOB or lightheadedness with hypotension: No Has patient had a PCN reaction causing severe rash involving mucus membranes or skin necrosis: No Has patient had a PCN reaction that required hospitalization No Has patient had a PCN reaction occurring within the last 10 years: No If all of the above answers are "NO", then may proceed with Cephalosporin use.    Trulicity [Dulaglutide]     Abdominal pain    Avocado Diarrhea and Other (See Comments)    Severe cramping, sweats, and diarrhea in upper GI/stomach   Tramadol Hcl Itching    BP 119/63  Ht 5' 1.75" (1.568 m)   Wt 208 lb (94.3 kg)   BMI 38.35 kg/m       No data to display              No data to display              Objective:  Physical Exam:  Gen: NAD, comfortable in exam room   Assessment & Plan:  1. Left  knee arthritis - orthovisc series started today.  Follow up in 1 week for second injection.  After informed written consent timeout was performed, patient was lying supine on exam table. Left knee was prepped with alcohol swab and utilizing superolateral approach with ultrasound guidance, patient's left knee was injected intraarticularly with 3mL lidocaine followed by orthovisc. Patient tolerated the procedure well without immediate complications.

## 2023-12-10 ENCOUNTER — Ambulatory Visit: Payer: BC Managed Care – PPO | Admitting: Family Medicine

## 2023-12-10 VITALS — BP 120/79 | Ht 61.75 in | Wt 208.0 lb

## 2023-12-10 DIAGNOSIS — M1712 Unilateral primary osteoarthritis, left knee: Secondary | ICD-10-CM | POA: Diagnosis not present

## 2023-12-10 MED ORDER — HYALURONAN 30 MG/2ML IX SOSY
30.0000 mg | PREFILLED_SYRINGE | Freq: Once | INTRA_ARTICULAR | Status: AC
  Administered 2023-12-10: 30 mg via INTRA_ARTICULAR

## 2023-12-10 NOTE — Progress Notes (Signed)
 PCP: Rada Buerger, MD  Subjective:   HPI: Patient is a 48 y.o. female here for left knee orthovisc injection.  Patient returns for second injection of orthovisc for her left knee. Overall improving though has pain anterior knee now inferior to patella.  Past Medical History:  Diagnosis Date   Adopted person    per pt unknown biological medical history   Anticoagulant long-term use    plavix --- managed by  ?neurology or pcp  (stroke prevention)   Asthma    Cervical dysplasia 07/2023   low grade   Diabetic gastroparesis (HCC)    Dyspnea    08-27-2023  pt stated able to do stairs with minimal sob, household chores w/ no sob   GAD (generalized anxiety disorder)    GERD (gastroesophageal reflux disease)    History of CVA (cerebrovascular accident) 03/25/2016   left MCA ---  without resiudals   History of CVA (cerebrovascular accident) 04/2016   neurologist--  Dr. Fernand Howard ([facility]);;  second cva treated with tpn,  without residials   History of HPV infection    History of small bowel obstruction    age 32 months s/p surgical intervention   Hyperlipidemia    Hypertension    Hypothyroidism    followed by endocrinologist   Insomnia    Insulin  dependent type 2 diabetes mellitus (HCC) 2004   endocrinology--- williams breeante PA;  has insulin  pump w/ novolog    Insulin  pump in place    Irritable bowel syndrome with diarrhea    MDD (major depressive disorder)    sees Dr. Maris Sickle in [place]    Migraine without aura and without status migrainosus, not intractable    sees Dr. Fernand Howard at [facility]    Morbid obesity [facility])    Multiple personality disorder (HCC)    w/ periods of catatonia   NASH (nonalcoholic steatohepatitis)    followed by [facility]-- dr sheehid   Neuropathy associated with endocrine disorder (HCC)    OA (osteoarthritis)    knees, shoulders, elbows   OSA (obstructive sleep apnea)    08-27-2023  per pt last study approx 4 yrs ago told moderate  osa,  no longer uses cpap , not functional , has schduled to have study done 12/ 2024 does not think she has osa any more   PCOS (polycystic ovarian syndrome)    PVC's (premature ventricular contractions)    EP cardiologly-- dr Jearldine Mina   Smokers' cough (HCC)    nonproductive   Wears contact lenses     Current Outpatient Medications on File Prior to Visit  Medication Sig Dispense Refill   acetaminophen  (TYLENOL ) 500 MG tablet Take 1,000 mg by mouth every 6 (six) hours as needed.     albuterol  (PROAIR  HFA) 108 (90 Base) MCG/ACT inhaler Inhale 2 puffs into the lungs every 4 (four) hours as needed for wheezing. 8.5 g 11   ARIPiprazole  (ABILIFY ) 30 MG tablet Take 30 mg by mouth every evening.     baclofen (LIORESAL) 10 MG tablet Take 10 mg by mouth every 4 (four) hours as needed (headaches).     budesonide -formoterol  (SYMBICORT ) 160-4.5 MCG/ACT inhaler Inhale 2 puffs into the lungs 2 (two) times daily. 1 Inhaler 11   buPROPion  (WELLBUTRIN  XL) 150 MG 24 hr tablet Take 350 mg by mouth daily.     clopidogrel  (PLAVIX ) 75 MG tablet TAKE ONE TABLET BY MOUTH DAILY (Patient taking differently: Take 75 mg by mouth daily.) 90 tablet 0   cyclobenzaprine  (FLEXERIL )  5 MG tablet Take 5 mg by mouth 3 (three) times daily as needed for muscle spasms.     diclofenac  Sodium (VOLTAREN ) 1 % GEL Apply 1 application topically 2 (two) times daily as needed (pain).     famotidine  (PEPCID ) 20 MG tablet Take 40 mg by mouth as needed for heartburn or indigestion (per pt takes when taking ibuprofen ).     fluticasone (FLONASE) 50 MCG/ACT nasal spray Place 1 spray into both nostrils 2 (two) times daily.     gabapentin  (NEURONTIN ) 400 MG capsule TAKE ONE CAPSULE BY MOUTH THREE TIMES A DAY (Patient not taking: Reported on 08/27/2023) 90 capsule 1   Galcanezumab-gnlm (EMGALITY) 120 MG/ML SOAJ Inject into the skin every 30 (thirty) days.     Glucagon (BAQSIMI ONE PACK) 3 MG/DOSE POWD Place 3 mg into the nose daily as needed (low  blood sugar).     hydrOXYzine  (VISTARIL ) 50 MG capsule Take 50 mg by mouth 3 (three) times daily. Per pt can take up to TID,  but currently just takes regularly qhs     ibuprofen  (ADVIL ) 200 MG tablet Take 800 mg by mouth every 8 (eight) hours as needed.     insulin  aspart (NOVOLOG ) 100 UNIT/ML injection Use in the insulin  pump as directed (Patient taking differently: as directed. Use in the insulin  pump as directed) 10 mL 0   Insulin  Glargine (BASAGLAR  KWIKPEN) 100 UNIT/ML Inject 30 Units into the skin as directed. Per pt when pump being used , as directed by provider do glargine daily (Patient not taking: Reported on 08/27/2023)     levothyroxine  (SYNTHROID , LEVOTHROID) 125 MCG tablet Take 1 tablet (125 mcg total) by mouth daily before breakfast. 90 tablet 3   linaclotide (LINZESS) 145 MCG CAPS capsule Take 145 mcg by mouth daily before breakfast.     loratadine  (CLARITIN ) 10 MG tablet Take 10 mg by mouth daily as needed for allergies.     Magnesium  500 MG CAPS Take 1 capsule by mouth daily.     Menthol , Topical Analgesic, (BIOFREEZE) 10 % LIQD Apply 1 application topically 2 (two) times daily as needed (pain).     metoprolol  succinate (TOPROL -XL) 25 MG 24 hr tablet Take 0.5 tablets (12.5 mg total) by mouth daily. 30 tablet 0   montelukast (SINGULAIR) 10 MG tablet Take 10 mg by mouth at bedtime.     Multiple Vitamins-Minerals (IMMUNE SUPPORT VITAMIN C PO) Take 1 capsule by mouth daily.     nystatin  (NYAMYC ) powder Apply topically QID. (Patient not taking: Reported on 08/27/2023) 60 g 0   ondansetron  (ZOFRAN -ODT) 4 MG disintegrating tablet Take 1 tablet (4 mg total) by mouth every 8 (eight) hours as needed. 12 tablet 1   pantoprazole  (PROTONIX ) 40 MG tablet TAKE ONE TABLET BY MOUTH TWICE A DAY (Patient taking differently: Take 40 mg by mouth 2 (two) times daily.) 30 tablet 1   Perfluorohexyloctane (MIEBO) 1.338 GM/ML SOLN Apply 1 drop to eye 2 (two) times daily.     polyethylene glycol powder  (GLYCOLAX /MIRALAX ) 17 GM/SCOOP powder Use 1 capful twice daily in 8 ounces of fluid (Patient taking differently: Take 17 g by mouth daily. Use 1 capful twice daily in 8 ounces of fluid) 510 g 3   prazosin  (MINIPRESS ) 5 MG capsule Take 10 mg by mouth at bedtime.     predniSONE  (DELTASONE ) 10 MG tablet Use as directed per doctors orders for the next 6 days. 21 tablet 0   Prenatal Vit-Fe Fumarate-FA (MULTIVITAMIN-PRENATAL) 27-0.8 MG TABS  tablet Take 1 tablet by mouth daily at 12 noon.     promethazine  (PHENERGAN ) 25 MG tablet Take 25 mg by mouth every 6 (six) hours as needed for nausea or vomiting.     Rimegepant Sulfate (NURTEC) 75 MG TBDP Take 75 mg by mouth daily as needed (migraines).     rosuvastatin  (CRESTOR ) 20 MG tablet Take 1 tablet (20 mg total) by mouth daily. (Patient taking differently: Take 20 mg by mouth at bedtime.) 30 tablet 6   terconazole (TERAZOL 7) 0.4 % vaginal cream Place 1 applicator vaginally at bedtime as needed (yeast infections).     Tirzepatide (MOUNJARO [place]) Inject 5 mg into the skin once a week. wedneday's (Patient not taking: Reported on 08/27/2023)     topiramate  (TOPAMAX ) 100 MG tablet Take 100 mg by mouth 2 (two) times daily.      traZODone  (DESYREL ) 100 MG tablet Take 200 mg by mouth at bedtime.     valACYclovir  (VALTREX ) 1000 MG tablet Take 1 tablet (1,000 mg total) by mouth 3 (three) times daily. (Patient not taking: Reported on 08/27/2023) 30 tablet 0   VITAMIN A PO Take 1 tablet by mouth daily.     No current facility-administered medications on file prior to visit.    Past Surgical History:  Procedure Laterality Date   CHOLECYSTECTOMY, LAPAROSCOPIC  05/13/2010   @[facility]  by dr m. wakefield   COLECTOMY  1975   age 55 months old (bowel obstruction)   COLPOSCOPY N/A 08/29/2023   Procedure: COLPOSCOPY;  Surgeon: Ashby Lawman, MD;  Location: [facility];  Service: Gynecology;  Laterality: N/A;   INGUINAL HERNIA REPAIR Left 1980   age 60   KNEE  CLOSED REDUCTION Right 08/22/2021   Procedure: RIGHT  KNEE MANIPULATION UNDER ANESTHESIA;  Surgeon: Jasmine Mesi, MD;  Location: [facility];  Service: Orthopedics;  Laterality: Right;   LAPAROSCOPIC ENDOMETRIOSIS FULGURATION  1998   LOOP RECORDER IMPLANT  02/05/2017   @[facility]  by dr f. al-khori;    (08-24-2023  per pt removed approx 2022)   TOTAL KNEE ARTHROPLASTY Right 07/05/2021   Procedure: TOTAL KNEE ARTHROPLASTY;  Surgeon: Jasmine Mesi, MD;  [facility];  Service: Orthopedics;  Laterality: Right;   WISDOM TOOTH EXTRACTION  2007    Allergies  Allergen Reactions   Macrobid [Nitrofurantoin] Hives and Shortness Of Breath   Metformin  And Related Other (See Comments)    Abdominal cramping    Byetta 10 Mcg Pen [Exenatide] Hives   Clindamycin/Lincomycin Nausea And Vomiting   Geodon [Ziprasidone Hcl]     Body starts shutting down   Hydromorphone  Itching    Per pt severe   Lipitor [Atorvastatin ]     Per patient this causes cramps   Nsaids     Heartburn   Penicillins Other (See Comments)    Childhood reaction Has patient had a PCN reaction causing immediate rash, facial/tongue/throat swelling, SOB or lightheadedness with hypotension: No Has patient had a PCN reaction causing severe rash involving mucus membranes or skin necrosis: No Has patient had a PCN reaction that required hospitalization No Has patient had a PCN reaction occurring within the last 10 years: No If all of the above answers are "NO", then may proceed with Cephalosporin use.    Trulicity [Dulaglutide]     Abdominal pain    Avocado Diarrhea and Other (See Comments)    Severe cramping, sweats, and diarrhea in upper GI/stomach   Tramadol Hcl Itching    BP 120/79  Ht 5' 1.75" (1.568 m)   Wt 208 lb (94.3 kg)   BMI 38.35 kg/m       No data to display              No data to display              Objective:  Physical Exam:  Gen: NAD, comfortable in exam room  Left  knee: Tenderness to palpation patellar tendon.  Limited MSK u/s left knee: no abnormalities of patellar tendon.  No infrapatellar bursitis.    Assessment & Plan:  1. Left knee osteoarthritis - second orthovisc given today.  Rest, icing for her patellar tendinitis that is acute.    After informed written consent timeout was performed, patient was lying supine on exam table. Left knee was prepped with alcohol swab and utilizing superolateral approach with ultrasound guidance, patient's left knee was injected intraarticularly with 3mL lidocaine  followed by orthovisc. Patient tolerated the procedure well without immediate complications.

## 2023-12-24 ENCOUNTER — Encounter: Payer: Self-pay | Admitting: Family Medicine

## 2023-12-24 ENCOUNTER — Ambulatory Visit: Payer: BC Managed Care – PPO | Admitting: Family Medicine

## 2023-12-24 VITALS — BP 139/67 | Ht 61.75 in | Wt 208.0 lb

## 2023-12-24 DIAGNOSIS — M1712 Unilateral primary osteoarthritis, left knee: Secondary | ICD-10-CM | POA: Diagnosis not present

## 2023-12-24 MED ORDER — HYALURONAN 30 MG/2ML IX SOSY
30.0000 mg | PREFILLED_SYRINGE | Freq: Once | INTRA_ARTICULAR | Status: AC
  Administered 2023-12-24: 30 mg via INTRA_ARTICULAR

## 2023-12-24 NOTE — Progress Notes (Signed)
 PCP: Herma Carson, MD  Subjective:   HPI: Patient is a 48 y.o. female here for left knee orthovisc injection.  Patient returns for her third orthovisc injection into left knee. Overall knee not much improved though dealing with some other pain issues including her back.  Past Medical History:  Diagnosis Date   Adopted person    per pt unknown biological medical history   Anticoagulant long-term use    plavix--- managed by  ?neurology or pcp  (stroke prevention)   Asthma    Cervical dysplasia 07/2023   low grade   Diabetic gastroparesis (HCC)    Dyspnea    08-27-2023  pt stated able to do stairs with minimal sob, household chores w/ no sob   GAD (generalized anxiety disorder)    GERD (gastroesophageal reflux disease)    History of CVA (cerebrovascular accident) 03/25/2016   left MCA ---  without resiudals   History of CVA (cerebrovascular accident) 04/2016   neurologist--  Dr. Quinn Plowman ([facility]);;  second cva treated with tpn,  without residials   History of HPV infection    History of small bowel obstruction    age 67 months s/p surgical intervention   Hyperlipidemia    Hypertension    Hypothyroidism    followed by endocrinologist   Insomnia    Insulin dependent type 2 diabetes mellitus (HCC) 2004   endocrinology--- williams breeante PA;  has insulin pump w/ novolog   Insulin pump in place    Irritable bowel syndrome with diarrhea    MDD (major depressive disorder)    sees Dr. Clayborn Heron in [place]    Migraine without aura and without status migrainosus, not intractable    sees Dr. 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   NASH (nonalcoholic steatohepatitis)    followed by [facility]-- dr sheehid   Neuropathy associated with endocrine disorder (HCC)    OA (osteoarthritis)    knees, shoulders, elbows   OSA (obstructive sleep apnea)    08-27-2023  per pt last study approx 4 yrs ago  told moderate osa,  no longer uses cpap , not functional , has schduled to have study done 12/ 2024 does not think she has osa any more   PCOS (polycystic ovarian syndrome)    PVC's (premature ventricular contractions)    EP cardiologly-- dr Rudolpho Sevin   Smokers' cough (HCC)    nonproductive   Wears contact lenses     Current Outpatient Medications on File Prior to Visit  Medication Sig Dispense Refill   acetaminophen (TYLENOL) 500 MG tablet Take 1,000 mg by mouth every 6 (six) hours as needed.     albuterol (PROAIR HFA) 108 (90 Base) MCG/ACT inhaler Inhale 2 puffs into the lungs every 4 (four) hours as needed for wheezing. 8.5 g 11   ARIPiprazole (ABILIFY) 30 MG tablet Take 30 mg by mouth every evening.     baclofen (LIORESAL) 10 MG tablet Take 10 mg by mouth every 4 (four) hours as needed (headaches).     budesonide-formoterol (SYMBICORT) 160-4.5 MCG/ACT inhaler Inhale 2 puffs into the lungs 2 (two) times daily. 1 Inhaler 11   buPROPion (WELLBUTRIN XL) 150 MG 24 hr tablet Take 350 mg by mouth daily.     clopidogrel (PLAVIX) 75 MG tablet TAKE ONE TABLET BY MOUTH DAILY (Patient taking differently: Take 75 mg by mouth daily.) 90 tablet 0  cyclobenzaprine (FLEXERIL) 5 MG tablet Take 5 mg by mouth 3 (three) times daily as needed for muscle spasms.     diclofenac Sodium (VOLTAREN) 1 % GEL Apply 1 application topically 2 (two) times daily as needed (pain).     famotidine (PEPCID) 20 MG tablet Take 40 mg by mouth as needed for heartburn or indigestion (per pt takes when taking ibuprofen).     fluticasone (FLONASE) 50 MCG/ACT nasal spray Place 1 spray into both nostrils 2 (two) times daily.     gabapentin (NEURONTIN) 400 MG capsule TAKE ONE CAPSULE BY MOUTH THREE TIMES A DAY (Patient not taking: Reported on 08/27/2023) 90 capsule 1   Galcanezumab-gnlm (EMGALITY) 120 MG/ML SOAJ Inject into the skin every 30 (thirty) days.     Glucagon (BAQSIMI ONE PACK) 3 MG/DOSE POWD Place 3 mg into the nose daily as  needed (low blood sugar).     hydrOXYzine (VISTARIL) 50 MG capsule Take 50 mg by mouth 3 (three) times daily. Per pt can take up to TID,  but currently just takes regularly qhs     ibuprofen (ADVIL) 200 MG tablet Take 800 mg by mouth every 8 (eight) hours as needed.     insulin aspart (NOVOLOG) 100 UNIT/ML injection Use in the insulin pump as directed (Patient taking differently: as directed. Use in the insulin pump as directed) 10 mL 0   Insulin Glargine (BASAGLAR KWIKPEN) 100 UNIT/ML Inject 30 Units into the skin as directed. Per pt when pump being used , as directed by provider do glargine daily (Patient not taking: Reported on 08/27/2023)     levothyroxine (SYNTHROID, LEVOTHROID) 125 MCG tablet Take 1 tablet (125 mcg total) by mouth daily before breakfast. 90 tablet 3   linaclotide (LINZESS) 145 MCG CAPS capsule Take 145 mcg by mouth daily before breakfast.     loratadine (CLARITIN) 10 MG tablet Take 10 mg by mouth daily as needed for allergies.     Magnesium 500 MG CAPS Take 1 capsule by mouth daily.     Menthol, Topical Analgesic, (BIOFREEZE) 10 % LIQD Apply 1 application topically 2 (two) times daily as needed (pain).     metoprolol succinate (TOPROL-XL) 25 MG 24 hr tablet Take 0.5 tablets (12.5 mg total) by mouth daily. 30 tablet 0   montelukast (SINGULAIR) 10 MG tablet Take 10 mg by mouth at bedtime.     Multiple Vitamins-Minerals (IMMUNE SUPPORT VITAMIN C PO) Take 1 capsule by mouth daily.     nystatin [facility]) powder Apply topically QID. (Patient not taking: Reported on 08/27/2023) 60 g 0   ondansetron (ZOFRAN-ODT) 4 MG disintegrating tablet Take 1 tablet (4 mg total) by mouth every 8 (eight) hours as needed. 12 tablet 1   pantoprazole (PROTONIX) 40 MG tablet TAKE ONE TABLET BY MOUTH TWICE A DAY (Patient taking differently: Take 40 mg by mouth 2 (two) times daily.) 30 tablet 1   Perfluorohexyloctane (MIEBO) 1.338 GM/ML SOLN Apply 1 drop to eye 2 (two) times daily.     polyethylene glycol  powder (GLYCOLAX/MIRALAX) 17 GM/SCOOP powder Use 1 capful twice daily in 8 ounces of fluid (Patient taking differently: Take 17 g by mouth daily. Use 1 capful twice daily in 8 ounces of fluid) 510 g 3   prazosin (MINIPRESS) 5 MG capsule Take 10 mg by mouth at bedtime.     predniSONE (DELTASONE) 10 MG tablet Use as directed per doctors orders for the next 6 days. 21 tablet 0   Prenatal Vit-Fe Fumarate-FA (MULTIVITAMIN-PRENATAL) 27-0.8  MG TABS tablet Take 1 tablet by mouth daily at 12 noon.     promethazine (PHENERGAN) 25 MG tablet Take 25 mg by mouth every 6 (six) hours as needed for nausea or vomiting.     Rimegepant Sulfate (NURTEC) 75 MG TBDP Take 75 mg by mouth daily as needed (migraines).     rosuvastatin (CRESTOR) 20 MG tablet Take 1 tablet (20 mg total) by mouth daily. (Patient taking differently: Take 20 mg by mouth at bedtime.) 30 tablet 6   terconazole (TERAZOL 7) 0.4 % vaginal cream Place 1 applicator vaginally at bedtime as needed (yeast infections).     Tirzepatide (MOUNJARO [place]) Inject 5 mg into the skin once a week. wedneday's (Patient not taking: Reported on 08/27/2023)     topiramate (TOPAMAX) 100 MG tablet Take 100 mg by mouth 2 (two) times daily.      traZODone (DESYREL) 100 MG tablet Take 200 mg by mouth at bedtime.     valACYclovir (VALTREX) 1000 MG tablet Take 1 tablet (1,000 mg total) by mouth 3 (three) times daily. (Patient not taking: Reported on 08/27/2023) 30 tablet 0   VITAMIN A PO Take 1 tablet by mouth daily.     No current facility-administered medications on file prior to visit.    Past Surgical History:  Procedure Laterality Date   CHOLECYSTECTOMY, LAPAROSCOPIC  05/13/2010   @[facility]  by dr m. wakefield   COLECTOMY  1975   age 89 months old (bowel obstruction)   COLPOSCOPY N/A 08/29/2023   Procedure: COLPOSCOPY;  Surgeon: Zelphia Cairo, MD;  Location: [facility];  Service: Gynecology;  Laterality: N/A;   INGUINAL HERNIA REPAIR Left 1980   age 62    KNEE CLOSED REDUCTION Right 08/22/2021   Procedure: RIGHT  KNEE MANIPULATION UNDER ANESTHESIA;  Surgeon: Cammy Copa, MD;  Location: [facility];  Service: Orthopedics;  Laterality: Right;   LAPAROSCOPIC ENDOMETRIOSIS FULGURATION  1998   LOOP RECORDER IMPLANT  02/05/2017   @[facility]  by dr f. al-khori;    (08-24-2023  per pt removed approx 2022)   TOTAL KNEE ARTHROPLASTY Right 07/05/2021   Procedure: TOTAL KNEE ARTHROPLASTY;  Surgeon: Cammy Copa, MD;  Location: [facility] OR;  Service: Orthopedics;  Laterality: Right;   WISDOM TOOTH EXTRACTION  2007    Allergies  Allergen Reactions   Macrobid [Nitrofurantoin] Hives and Shortness Of Breath   Metformin And Related Other (See Comments)    Abdominal cramping    Byetta 10 Mcg Pen [Exenatide] Hives   Clindamycin/Lincomycin Nausea And Vomiting   Geodon [Ziprasidone Hcl]     Body starts shutting down   Hydromorphone Itching    Per pt severe   Lipitor [Atorvastatin]     Per patient this causes cramps   Nsaids     Heartburn   Penicillins Other (See Comments)    Childhood reaction Has patient had a PCN reaction causing immediate rash, facial/tongue/throat swelling, SOB or lightheadedness with hypotension: No Has patient had a PCN reaction causing severe rash involving mucus membranes or skin necrosis: No Has patient had a PCN reaction that required hospitalization No Has patient had a PCN reaction occurring within the last 10 years: No If all of the above answers are "NO", then may proceed with Cephalosporin use.    Trulicity [Dulaglutide]     Abdominal pain    Avocado Diarrhea and Other (See Comments)    Severe cramping, sweats, and diarrhea in upper GI/stomach   Tramadol Hcl Itching  BP 139/67   Ht 5' 1.75" (1.568 m)   Wt 208 lb (94.3 kg)   BMI 38.35 kg/m       No data to display              No data to display              Objective:  Physical Exam:  Gen: NAD, comfortable in exam  room  Knee exam not repeated today.   Assessment & Plan:  1. Left knee osteoarthritis - third orthovisc given today.  She will see how she does over the next month with consideration of a fourth at that time.  After informed written consent timeout was performed, patient was lying supine on exam table. Left knee was prepped with alcohol swab and utilizing superolateral approach with ultrasound guidance, patient's left knee was injected intraarticularly with 3mL lidocaine followed by orthovisc. Patient tolerated the procedure well without immediate complications.

## 2023-12-25 ENCOUNTER — Ambulatory Visit: Payer: BC Managed Care – PPO | Admitting: Internal Medicine

## 2024-01-01 ENCOUNTER — Encounter: Payer: Self-pay | Admitting: Physical Medicine & Rehabilitation

## 2024-01-01 NOTE — Progress Notes (Deleted)
 NEW PATIENT Date of Service/Encounter:  01/01/24 Referring provider: {Blank single:19197::"Mandry, Heidi, MD","none-self referred"} Primary care provider: Herma Carson, MD  Subjective:  Miranda [name] is a 48 y.o. female with a PMHx of PTSD, depression, hypothyroidism, diabetes mellitus, migraines, hypertension, GERD, fatty liver disease, PTSD, OSA presenting today for evaluation of *** History obtained from: chart review and {Persons; PED relatives w/patient:19415::"patient"}.   Discussed the use of AI scribe software for clinical note transcription with the patient, who gave verbal consent to proceed.  History of Present Illness            Chart Review:  Reviewed PCP notes from referral ***: ***  Other allergy screening: Asthma: {Blank single:19197::"yes","no"} Rhino conjunctivitis: {Blank single:19197::"yes","no"} Food allergy: {Blank single:19197::"yes","no"} Medication allergy: {Blank single:19197::"yes","no"} Hymenoptera allergy: {Blank single:19197::"yes","no"} Urticaria: {Blank single:19197::"yes","no"} Eczema:{Blank single:19197::"yes","no"} History of recurrent infections suggestive of immunodeficency: {Blank single:19197::"yes","no"} ***Vaccinations are up to date.   Past Medical History: Past Medical History:  Diagnosis Date   Adopted person    per pt unknown biological medical history   Anticoagulant long-term use    plavix--- managed by  ?neurology or pcp  (stroke prevention)   Asthma    Cervical dysplasia 07/2023   low grade   Diabetic gastroparesis (HCC)    Dyspnea    08-27-2023  pt stated able to do stairs with minimal sob, household chores w/ no sob   GAD (generalized anxiety disorder)    GERD (gastroesophageal reflux disease)    History of CVA (cerebrovascular accident) 03/25/2016   left MCA ---  without resiudals   History of CVA (cerebrovascular accident) 04/2016   neurologist--  Dr. Quinn Plowman ([facility]);;  second cva treated with tpn,   without residials   History of HPV infection    History of small bowel obstruction    age 29 months s/p surgical intervention   Hyperlipidemia    Hypertension    Hypothyroidism    followed by endocrinologist   Insomnia    Insulin dependent type 2 diabetes mellitus (HCC) 2004   endocrinology--- williams breeante PA;  has insulin pump w/ novolog   Insulin pump in place    Irritable bowel syndrome with diarrhea    MDD (major depressive disorder)    sees Dr. Clayborn Heron in [place]    Migraine without aura and without status migrainosus, not intractable    sees Dr. 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   NASH (nonalcoholic steatohepatitis)    followed by [facility]-- dr sheehid   Neuropathy associated with endocrine disorder (HCC)    OA (osteoarthritis)    knees, shoulders, elbows   OSA (obstructive sleep apnea)    08-27-2023  per pt last study approx 4 yrs ago told moderate osa,  no longer uses cpap , not functional , has schduled to have study done 12/ 2024 does not think she has osa any more   PCOS (polycystic ovarian syndrome)    PVC's (premature ventricular contractions)    EP cardiologly-- dr Rudolpho Sevin   Smokers' cough (HCC)    nonproductive   Wears contact lenses    Medication List:  Current Outpatient Medications  Medication Sig Dispense Refill   acetaminophen (TYLENOL) 500 MG tablet Take 1,000 mg by mouth every 6 (six) hours as needed.     albuterol (PROAIR HFA) 108 (90 Base) MCG/ACT inhaler Inhale 2 puffs into the lungs every 4 (four)  hours as needed for wheezing. 8.5 g 11   ARIPiprazole (ABILIFY) 30 MG tablet Take 30 mg by mouth every evening.     baclofen (LIORESAL) 10 MG tablet Take 10 mg by mouth every 4 (four) hours as needed (headaches).     budesonide-formoterol (SYMBICORT) 160-4.5 MCG/ACT inhaler Inhale 2 puffs into the lungs 2 (two) times daily. 1 Inhaler 11   buPROPion (WELLBUTRIN  XL) 150 MG 24 hr tablet Take 350 mg by mouth daily.     clopidogrel (PLAVIX) 75 MG tablet TAKE ONE TABLET BY MOUTH DAILY (Patient taking differently: Take 75 mg by mouth daily.) 90 tablet 0   cyclobenzaprine (FLEXERIL) 5 MG tablet Take 5 mg by mouth 3 (three) times daily as needed for muscle spasms.     diclofenac Sodium (VOLTAREN) 1 % GEL Apply 1 application topically 2 (two) times daily as needed (pain).     famotidine (PEPCID) 20 MG tablet Take 40 mg by mouth as needed for heartburn or indigestion (per pt takes when taking ibuprofen).     fluticasone (FLONASE) 50 MCG/ACT nasal spray Place 1 spray into both nostrils 2 (two) times daily.     gabapentin (NEURONTIN) 400 MG capsule TAKE ONE CAPSULE BY MOUTH THREE TIMES A DAY (Patient not taking: Reported on 08/27/2023) 90 capsule 1   Galcanezumab-gnlm (EMGALITY) 120 MG/ML SOAJ Inject into the skin every 30 (thirty) days.     Glucagon (BAQSIMI ONE PACK) 3 MG/DOSE POWD Place 3 mg into the nose daily as needed (low blood sugar).     hydrOXYzine (VISTARIL) 50 MG capsule Take 50 mg by mouth 3 (three) times daily. Per pt can take up to TID,  but currently just takes regularly qhs     ibuprofen (ADVIL) 200 MG tablet Take 800 mg by mouth every 8 (eight) hours as needed.     insulin aspart (NOVOLOG) 100 UNIT/ML injection Use in the insulin pump as directed (Patient taking differently: as directed. Use in the insulin pump as directed) 10 mL 0   Insulin Glargine (BASAGLAR KWIKPEN) 100 UNIT/ML Inject 30 Units into the skin as directed. Per pt when pump being used , as directed by provider do glargine daily (Patient not taking: Reported on 08/27/2023)     levothyroxine (SYNTHROID, LEVOTHROID) 125 MCG tablet Take 1 tablet (125 mcg total) by mouth daily before breakfast. 90 tablet 3   linaclotide (LINZESS) 145 MCG CAPS capsule Take 145 mcg by mouth daily before breakfast.     loratadine (CLARITIN) 10 MG tablet Take 10 mg by mouth daily as needed for allergies.      Magnesium 500 MG CAPS Take 1 capsule by mouth daily.     Menthol, Topical Analgesic, (BIOFREEZE) 10 % LIQD Apply 1 application topically 2 (two) times daily as needed (pain).     metoprolol succinate (TOPROL-XL) 25 MG 24 hr tablet Take 0.5 tablets (12.5 mg total) by mouth daily. 30 tablet 0   montelukast (SINGULAIR) 10 MG tablet Take 10 mg by mouth at bedtime.     Multiple Vitamins-Minerals (IMMUNE SUPPORT VITAMIN C PO) Take 1 capsule by mouth daily.     nystatin [facility]) powder Apply topically QID. (Patient not taking: Reported on 08/27/2023) 60 g 0   ondansetron (ZOFRAN-ODT) 4 MG disintegrating tablet Take 1 tablet (4 mg total) by mouth every 8 (eight) hours as needed. 12 tablet 1   pantoprazole (PROTONIX) 40 MG tablet TAKE ONE TABLET BY MOUTH TWICE A DAY (Patient taking differently: Take 40 mg by mouth  2 (two) times daily.) 30 tablet 1   Perfluorohexyloctane (MIEBO) 1.338 GM/ML SOLN Apply 1 drop to eye 2 (two) times daily.     polyethylene glycol powder (GLYCOLAX/MIRALAX) 17 GM/SCOOP powder Use 1 capful twice daily in 8 ounces of fluid (Patient taking differently: Take 17 g by mouth daily. Use 1 capful twice daily in 8 ounces of fluid) 510 g 3   prazosin (MINIPRESS) 5 MG capsule Take 10 mg by mouth at bedtime.     predniSONE (DELTASONE) 10 MG tablet Use as directed per doctors orders for the next 6 days. 21 tablet 0   Prenatal Vit-Fe Fumarate-FA (MULTIVITAMIN-PRENATAL) 27-0.8 MG TABS tablet Take 1 tablet by mouth daily at 12 noon.     promethazine (PHENERGAN) 25 MG tablet Take 25 mg by mouth every 6 (six) hours as needed for nausea or vomiting.     Rimegepant Sulfate (NURTEC) 75 MG TBDP Take 75 mg by mouth daily as needed (migraines).     rosuvastatin (CRESTOR) 20 MG tablet Take 1 tablet (20 mg total) by mouth daily. (Patient taking differently: Take 20 mg by mouth at bedtime.) 30 tablet 6   terconazole (TERAZOL 7) 0.4 % vaginal cream Place 1 applicator vaginally at bedtime as needed (yeast  infections).     Tirzepatide (MOUNJARO [place]) Inject 5 mg into the skin once a week. wedneday's (Patient not taking: Reported on 08/27/2023)     topiramate (TOPAMAX) 100 MG tablet Take 100 mg by mouth 2 (two) times daily.      traZODone (DESYREL) 100 MG tablet Take 200 mg by mouth at bedtime.     valACYclovir (VALTREX) 1000 MG tablet Take 1 tablet (1,000 mg total) by mouth 3 (three) times daily. (Patient not taking: Reported on 08/27/2023) 30 tablet 0   VITAMIN A PO Take 1 tablet by mouth daily.     No current facility-administered medications for this visit.   Known Allergies:  Allergies  Allergen Reactions   Macrobid [Nitrofurantoin] Hives and Shortness Of Breath   Metformin And Related Other (See Comments)    Abdominal cramping    Byetta 10 Mcg Pen [Exenatide] Hives   Clindamycin/Lincomycin Nausea And Vomiting   Geodon [Ziprasidone Hcl]     Body starts shutting down   Hydromorphone Itching    Per pt severe   Lipitor [Atorvastatin]     Per patient this causes cramps   Nsaids     Heartburn   Penicillins Other (See Comments)    Childhood reaction Has patient had a PCN reaction causing immediate rash, facial/tongue/throat swelling, SOB or lightheadedness with hypotension: No Has patient had a PCN reaction causing severe rash involving mucus membranes or skin necrosis: No Has patient had a PCN reaction that required hospitalization No Has patient had a PCN reaction occurring within the last 10 years: No If all of the above answers are "NO", then may proceed with Cephalosporin use.    Trulicity [Dulaglutide]     Abdominal pain    Avocado Diarrhea and Other (See Comments)    Severe cramping, sweats, and diarrhea in upper GI/stomach   Tramadol Hcl Itching   Past Surgical History: Past Surgical History:  Procedure Laterality Date   CHOLECYSTECTOMY, LAPAROSCOPIC  05/13/2010   @[facility]  by dr m. wakefield   COLECTOMY  1975   age 66 months old (bowel obstruction)   COLPOSCOPY N/A  08/29/2023   Procedure: COLPOSCOPY;  Surgeon: Zelphia Cairo, MD;  Location: [facility];  Service: Gynecology;  Laterality: N/A;  INGUINAL HERNIA REPAIR Left 1980   age 60   KNEE CLOSED REDUCTION Right 08/22/2021   Procedure: RIGHT  KNEE MANIPULATION UNDER ANESTHESIA;  Surgeon: Cammy Copa, MD;  Location: [facility];  Service: Orthopedics;  Laterality: Right;   LAPAROSCOPIC ENDOMETRIOSIS FULGURATION  1998   LOOP RECORDER IMPLANT  02/05/2017   @[facility]  by dr f. al-khori;    (08-24-2023  per pt removed approx 2022)   TOTAL KNEE ARTHROPLASTY Right 07/05/2021   Procedure: TOTAL KNEE ARTHROPLASTY;  Surgeon: Cammy Copa, MD;  Location: [facility];  Service: Orthopedics;  Laterality: Right;   WISDOM TOOTH EXTRACTION  2007   Family History: Family History  Adopted: Yes  Problem Relation Age of Onset   Heart disease Other        on both sides of family   Diabetes Other        on both sides of family   Social History: Margia lives ***.   ROS:  All other systems negative except as noted per HPI.  Objective:  There were no vitals taken for this visit. There is no height or weight on file to calculate BMI. Physical Exam:  General Appearance:  Alert, cooperative, no distress, appears stated age  Head:  Normocephalic, without obvious abnormality, atraumatic  Eyes:  Conjunctiva clear, EOM's intact  Ears {Blank multiple:19196:a:"***","EACs normal bilaterally","normal TMs bilaterally","ear tubes present bilaterally without exudate"}  Nose: Nares normal, {Blank multiple:19196:a:"***","hypertrophic turbinates","normal mucosa","no visible anterior polyps","septum midline"}  Throat: Lips, tongue normal; teeth and gums normal, {Blank multiple:19196:a:"***","normal posterior oropharynx","tonsils 2+","tonsils 3+","no tonsillar exudate","+ cobblestoning","surgically absent tonsils","mildly erythematous posterior oropharynx"}  Neck: Supple, symmetrical  Lungs:    {Blank multiple:19196:a:"***","clear to auscultation bilaterally","end-expiratory wheezing","wheezing throughout"}, Respirations unlabored, {Blank multiple:19196:a:"***","no coughing","intermittent dry coughing","intermittent productive-sounding cough"}  Heart:  {Blank multiple:19196:a:"***","regular rate and rhythm","no murmur"}, Appears well perfused  Extremities: No edema  Skin: {Blank multiple:19196:a:"***","erythematous, dry patches scattered on ***","lichenification on ***","Skin color, texture, turgor normal","no rashes or lesions on visualized portions of skin"}  Neurologic: No gross deficits   Diagnostics: Spirometry:  Tracings reviewed. Her effort: {Blank single:19197::"Good reproducible efforts.","It was hard to get consistent efforts and there is a question as to whether this reflects a maximal maneuver.","Poor effort, data can not be interpreted.","Variable effort-results affected","effort okay for first attempt at spirometry.","Results not reproducible due to ***"} FVC: ***L (pre), ***L  (post) FEV1: ***L, ***% predicted (pre), ***L, ***% predicted (post) FEV1/FVC ratio: *** (pre), *** (post) Interpretation: {Blank single:19197::"Spirometry consistent with mild obstructive disease","Spirometry consistent with moderate obstructive disease","Spirometry consistent with severe obstructive disease","Spirometry consistent with possible restrictive disease","Spirometry consistent with mixed obstructive and restrictive disease","Spirometry uninterpretable due to technique","Spirometry consistent with normal pattern","No overt abnormalities noted given today's efforts","Nonobstructive ratio, low FEV1","Nonobstructive ratio, low FEV1, possible restriction"}.  Please see scanned spirometry results for details.  Skin Testing: {Blank single:19197::"Select foods","Environmental allergy panel","Environmental allergy panel and select foods","Food allergy panel","None","Deferred due to recent  antihistamines use","deferred due to recent reaction","Pediatric Environmental Allergy Panel","Pediatric Food Panel","Select foods and environmental allergies"}. {Blank single:19197::"Adequate positive and negative controls","Inadequate positive control-testing invalid","Adequate positive and negative controls, dermatographism present, testing difficult to interpret"}. Results discussed with patient/family.   {Blank single:19197::"Allergy testing results were read and interpreted by myself, documented by clinical staff.","Allergy testing results were read by ***,FNP, documented by clinical staff"}  Labs:  Lab Orders  No laboratory test(s) ordered today     Assessment and Plan  Assessment and Plan               {Blank single:19197::"This note in  its entirety was forwarded to the Provider who requested this consultation."}  Other: {Blank multiple:19196:a:"***","samples provided of: ***","school forms provided","reviewed spirometry technique","reviewed inhaler technique"}  Thank you for your kind referral. I appreciate the opportunity to take part in Colin's care. Please do not hesitate to contact me with questions.***  Sincerely,  Tonny Bollman, MD [facility]

## 2024-01-02 ENCOUNTER — Ambulatory Visit: Payer: Self-pay | Admitting: Internal Medicine

## 2024-01-09 ENCOUNTER — Ambulatory Visit (INDEPENDENT_AMBULATORY_CARE_PROVIDER_SITE_OTHER): Payer: Self-pay | Admitting: Obstetrics and Gynecology

## 2024-01-09 ENCOUNTER — Encounter: Payer: Self-pay | Admitting: Obstetrics and Gynecology

## 2024-01-09 VITALS — BP 113/77 | HR 101 | Ht 61.0 in | Wt 212.0 lb

## 2024-01-09 DIAGNOSIS — N3941 Urge incontinence: Secondary | ICD-10-CM

## 2024-01-09 DIAGNOSIS — N3946 Mixed incontinence: Secondary | ICD-10-CM

## 2024-01-09 DIAGNOSIS — N8110 Cystocele, unspecified: Secondary | ICD-10-CM

## 2024-01-09 DIAGNOSIS — R4589 Other symptoms and signs involving emotional state: Secondary | ICD-10-CM | POA: Diagnosis not present

## 2024-01-09 DIAGNOSIS — N393 Stress incontinence (female) (male): Secondary | ICD-10-CM

## 2024-01-09 DIAGNOSIS — R350 Frequency of micturition: Secondary | ICD-10-CM | POA: Diagnosis not present

## 2024-01-09 LAB — POCT URINALYSIS DIPSTICK
Bilirubin, UA: NEGATIVE
Blood, UA: NEGATIVE
Glucose, UA: NEGATIVE
Ketones, UA: NEGATIVE
Leukocytes, UA: NEGATIVE
Nitrite, UA: NEGATIVE
Protein, UA: NEGATIVE
Spec Grav, UA: 1.015 (ref 1.010–1.025)
Urobilinogen, UA: 0.2 U/dL
pH, UA: 5.5 (ref 5.0–8.0)

## 2024-01-09 MED ORDER — TROSPIUM CHLORIDE 20 MG PO TABS: 20.0000 mg | ORAL_TABLET | Freq: Two times a day (BID) | ORAL | 5 refills | Status: DC
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
## 2024-01-09 NOTE — Patient Instructions (Addendum)
 Today we talked about ways to manage bladder urgency such as altering your diet to avoid irritative beverages and foods (bladder diet) as well as attempting to decrease stress and other exacerbating factors.  You can also chew a plain Tums 1-3 times per day to make your urine less acidic, especially if you have eating/drinking acidic things.   The Most Bothersome Foods* The Least Bothersome Foods*  Coffee - Regular & Decaf Tea - caffeinated Carbonated beverages - cola, non-colas, diet & caffeine-free Alcohols - Beer, Red Wine, White Wine, [address] - Grapefruit, Chemult, Orange, [employer] - Cranberry, Grapefruit, Orange, Pineapple Vegetables - Tomato & Tomato Products Flavor Enhancers - Hot peppers, Spicy foods, Chili, Horseradish, Vinegar, Monosodium glutamate (MSG) Artificial Sweeteners - NutraSweet, Sweet 'N Low, Equal (sweetener), Saccharin Ethnic foods - [place], [place], [place] food [employer] - low-fat & whole Fruits - Bananas, Blueberries, Honeydew melon, Pears, Raisins, Watermelon Vegetables - Broccoli, [address] Sprouts, [place], Carrots, Cauliflower, [place], Cucumber, Mushrooms, Peas, Radishes, Squash, Zucchini, White potatoes, Sweet potatoes & yams Poultry - Chicken, Eggs, [place], [employer] - Beef, Diplomatic Services operational officer, Lamb Seafood - Shrimp, [place] fish, Salmon Grains - Oat, Rice Snacks - Pretzels, Popcorn  *Lenward Chancellor et al. Diet and its role in interstitial cystitis/bladder pain syndrome (IC/BPS) and comorbid conditions. [employer]. BJU Int. 2012 Jan 11.    For the leakage with cough and sneeze we can consider urethral bulking or surgical sling. You can also consider a pessary for the leakage. This is the menstrual cup device that can provide support for the urethra.   For the bladder leakage with urgency and frequency we can start with a medication and then consider other options moving forward

## 2024-01-17 ENCOUNTER — Telehealth: Payer: Self-pay | Admitting: Obstetrics and Gynecology
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
## 2024-01-21 ENCOUNTER — Ambulatory Visit: Payer: Self-pay | Admitting: Internal Medicine

## 2024-01-21 ENCOUNTER — Ambulatory Visit: Payer: Self-pay | Admitting: Family Medicine

## 2024-01-23 ENCOUNTER — Ambulatory Visit (INDEPENDENT_AMBULATORY_CARE_PROVIDER_SITE_OTHER): Payer: Self-pay | Admitting: Sports Medicine

## 2024-01-23 VITALS — BP 135/77 | Ht 61.0 in | Wt 206.0 lb

## 2024-01-23 DIAGNOSIS — M6530 Trigger finger, unspecified finger: Secondary | ICD-10-CM | POA: Diagnosis present

## 2024-01-23 MED ORDER — METHYLPREDNISOLONE ACETATE 40 MG/ML IJ SUSP
20.0000 mg | Freq: Once | INTRAMUSCULAR | Status: AC
  Administered 2024-01-23: 20 mg via INTRA_ARTICULAR

## 2024-01-23 NOTE — Progress Notes (Signed)
 PCP: Miranda Carson, MD  SUBJECTIVE:   HPI:  Patient is a 48 y.o. female here with chief complaint of right thumb pain for ~1 month. She reports pain and stiffness of the base of her right thumb. Feels like she cannot flex at the MCP joint, and when she does it will lock in flexion. Was bad this morning but actually not hurting too bad right now. Denies any injury. Has been trying to manage with tylenol though not improving, cannot tolerate NSAIDs d/t reflux and hasn't tried voltaren.  Saw her physical therapist yesterday who recommended she get an evaluation and consider an injection.  Pertinent ROS were reviewed with the patient and found to be negative unless otherwise specified above in HPI.   PERTINENT  PMH / PSH / FH / SH:  Past Medical, Surgical, Social, and Family History Reviewed & Updated in the EMR.  Pertinent findings include:  H/o CVA GERD T2DM on insulin, most recent A1c reviewed and 7.2. Though review of her Dexcom shows improved control with estimated A1c over past 30 days being 6.5  Allergies  Allergen Reactions   Macrobid [Nitrofurantoin] Hives and Shortness Of Breath   Metformin And Related Other (See Comments)    Abdominal cramping    Byetta 10 Mcg Pen [Exenatide] Hives   Clindamycin/Lincomycin Nausea And Vomiting   Geodon [Ziprasidone Hcl]     Body starts shutting down   Hydromorphone Itching    Per pt severe   Lipitor [Atorvastatin]     Per patient this causes cramps   Nsaids     Heartburn   Penicillins Other (See Comments)    Childhood reaction Has patient had a PCN reaction causing immediate rash, facial/tongue/throat swelling, SOB or lightheadedness with hypotension: No Has patient had a PCN reaction causing severe rash involving mucus membranes or skin necrosis: No Has patient had a PCN reaction that required hospitalization No Has patient had a PCN reaction occurring within the last 10 years: No If all of the above answers are "NO", then may proceed  with Cephalosporin use.    Trulicity [Dulaglutide]     Abdominal pain    Avocado Diarrhea and Other (See Comments)    Severe cramping, sweats, and diarrhea in upper GI/stomach   Tramadol Hcl Itching    OBJECTIVE:  BP 135/77   Ht 5\' 1"  (1.549 m)   Wt 206 lb (93.4 kg)   BMI 38.92 kg/m   PHYSICAL EXAM:  GEN: Alert and Oriented, NAD, comfortable in exam room RESP: Unlabored respirations, symmetric chest rise PSY: normal mood, congruent affect   Right Hand MSK EXAM: No deformity or swelling.  Thumb with limited ROM. Full flex/ext at the IP joint, but limited at the MCP joint 2/2 pain. Point TTP over flexor tendon at the A1 pulley, no tenderness distally. Mildly TTP along the sides of the 1st MCP joint Palpable nodule in the right flexor tendon at the A1 pulley. NVI distally  Assessment & Plan Trigger finger, acquired Exam most consistent with trigger finger of the base of the right thumb. Recommended cortisone injection which was performed below. Advised icing this evening and can do topical voltaren prn as well as tylenol. Recommended monitoring response to the injection over the next 3-4 weeks and if having persistent pain/triggering consider a second injection. Patient's questions were answered and they are in agreement with this plan.  Trigger Finger Injection Procedure:   Risks & benefits of right thumb trigger finger injection reviewed.  Verbal and written consent  obtained.  Time-out completed.  Patient prepped and draped in the normal fashion.  Area cleansed with alcohol.  Ethyl chloride spray used to anesthetize the skin.  Solution of 0.5 mL 1% lidocaine with 0.5 mL methylprednisolone (Depo-Medrol) 40mg /ml injected into right 1st finger A1 pulley.  Patient tolerated procedure well without any complications.  Area covered with adhesive bandage.  Post-procedure care reviewed.  All questions answered.  Miranda Salen, MD PGY-4, Sports Medicine Fellow [facility]

## 2024-01-23 NOTE — Patient Instructions (Signed)
 Follow-up for a second injection in 4 weeks if no improvement after this first one.  Today you received an injection with a corticosteroid (aka: cortisone injection). This injection is usually done in response to pain and inflammation. There is some "numbing medicine" (Lidocaine) in the shot, so the injected area may be numb and feel really good for the next couple of hours. The numbing medicine usually wears off in 2-3 hours, and then your pain level may be back to where it was before the injection until the cortisone starts working.    The actually benefit from the steroid injection is usually noticed within 3-5 days, but may take up to 14 days. You may actually experience a small (as in 10%) INCREASE in pain in the first 24 hours---that is common.  Things to watch out for that you should contact us or a health care provider urgently would include: 1. Unusual (as in more than 10%) increase in pain 2. New fever > 101.5 3. New swelling or redness of the injected area. 4. Streaking of red lines around the area injected.  Do not hesitate to call or reach out with any questions or concerns.

## 2024-01-28 ENCOUNTER — Ambulatory Visit (INDEPENDENT_AMBULATORY_CARE_PROVIDER_SITE_OTHER): Payer: Self-pay | Admitting: Family Medicine

## 2024-01-28 ENCOUNTER — Encounter: Payer: Self-pay | Admitting: Family Medicine

## 2024-01-28 ENCOUNTER — Other Ambulatory Visit: Payer: Self-pay

## 2024-01-28 VITALS — Ht 61.0 in | Wt 202.0 lb

## 2024-01-28 DIAGNOSIS — M1712 Unilateral primary osteoarthritis, left knee: Secondary | ICD-10-CM

## 2024-01-28 MED ORDER — HYALURONAN 30 MG/2ML IX SOSY
30.0000 mg | PREFILLED_SYRINGE | Freq: Once | INTRA_ARTICULAR | Status: AC
  Administered 2024-01-28: 30 mg via INTRA_ARTICULAR

## 2024-01-28 NOTE — Progress Notes (Signed)
 PCP: Herma Carson, MD  Subjective:   HPI: Patient is a 48 y.o. female here for left knee orthovisc injection.  2/24: Patient returns for her third orthovisc injection into left knee. Overall knee not much improved though dealing with some other pain issues including her back.  3/31: Patient returns for fourth orthovisc injection. Her knee pain has improved and was able to hike as a result. Worse on concrete and asphalt.  Past Medical History:  Diagnosis Date   Adopted person    per pt unknown biological medical history   Anticoagulant long-term use    plavix--- managed by  ?neurology or pcp  (stroke prevention)   Asthma    Carpal tunnel syndrome    Cervical dysplasia 07/2023   low grade   Diabetic gastroparesis (HCC)    Dyspnea    08-27-2023  pt stated able to do stairs with minimal sob, household chores w/ no sob   GAD (generalized anxiety disorder)    GERD (gastroesophageal reflux disease)    History of CVA (cerebrovascular accident) 03/25/2016   left MCA ---  without resiudals   History of CVA (cerebrovascular accident) 04/2016   neurologist--  Dr. Quinn Plowman ([facility]);;  second cva treated with tpn,  without residials   History of HPV infection    History of small bowel obstruction    age 22 months s/p surgical intervention   Hyperlipidemia    Hypertension    Hypothyroidism    followed by endocrinologist   Insomnia    Insulin dependent type 2 diabetes mellitus (HCC) 2004   endocrinology--- williams breeante PA;  has insulin pump w/ novolog   Insulin pump in place    Irritable bowel syndrome with diarrhea    MDD (major depressive disorder)    sees Dr. Clayborn Heron in [place]    Migraine without aura and without status migrainosus, not intractable    sees Dr. Quinn Plowman at [facility]    Morbid obesity [facility])    Multiple personality disorder (HCC)    w/ periods of catatonia   NASH (nonalcoholic steatohepatitis)    followed by [facility]-- dr sheehid    Neuropathy associated with endocrine disorder (HCC)    OA (osteoarthritis)    knees, shoulders, elbows   OSA (obstructive sleep apnea)    08-27-2023  per pt last study approx 4 yrs ago told moderate osa,  no longer uses cpap , not functional , has schduled to have study done 12/ 2024 does not think she has osa any more   PCOS (polycystic ovarian syndrome)    PVC's (premature ventricular contractions)    EP cardiologly-- dr Rudolpho Sevin   Smokers' cough (HCC)    nonproductive   Wears contact lenses     Current Outpatient Medications on File Prior to Visit  Medication Sig Dispense Refill   acetaminophen (TYLENOL) 500 MG tablet Take 1,000 mg by mouth every 6 (six) hours as needed.     albuterol (PROAIR HFA) 108 (90 Base) MCG/ACT inhaler Inhale 2 puffs into the lungs every 4 (four) hours as needed for wheezing. 8.5 g 11   budesonide-formoterol (SYMBICORT) 160-4.5 MCG/ACT inhaler Inhale 2 puffs into the lungs 2 (two) times daily. 1 Inhaler 11   cetirizine (ZYRTEC) 10 MG tablet Take 50 mg by mouth in the morning, at noon, and at bedtime.     clopidogrel (PLAVIX) 75 MG tablet TAKE ONE TABLET BY MOUTH DAILY (Patient taking differently: Take 75 mg by mouth daily.) 90 tablet 0  cyclobenzaprine (FLEXERIL) 5 MG tablet Take 5 mg by mouth 3 (three) times daily as needed for muscle spasms.     diclofenac Sodium (VOLTAREN) 1 % GEL Apply 1 application topically 2 (two) times daily as needed (pain).     Galcanezumab-gnlm (EMGALITY) 120 MG/ML SOAJ Inject into the skin every 30 (thirty) days.     Glucagon (BAQSIMI ONE PACK) 3 MG/DOSE POWD Place 3 mg into the nose daily as needed (low blood sugar).     ibuprofen (ADVIL) 200 MG tablet Take 800 mg by mouth every 8 (eight) hours as needed.     insulin aspart (NOVOLOG) 100 UNIT/ML injection Use in the insulin pump as directed (Patient taking differently: as directed. Use in the insulin pump as directed) 10 mL 0   Insulin Glargine (BASAGLAR KWIKPEN) 100 UNIT/ML Inject 30  Units into the skin as directed. Per pt when pump being used , as directed by provider do glargine daily     levothyroxine (SYNTHROID, LEVOTHROID) 125 MCG tablet Take 1 tablet (125 mcg total) by mouth daily before breakfast. 90 tablet 3   linaclotide (LINZESS) 145 MCG CAPS capsule Take 145 mcg by mouth daily before breakfast.     Magnesium 500 MG CAPS Take 1 capsule by mouth daily.     Menthol, Topical Analgesic, (BIOFREEZE) 10 % LIQD Apply 1 application topically 2 (two) times daily as needed (pain).     metoprolol succinate (TOPROL-XL) 25 MG 24 hr tablet Take 0.5 tablets (12.5 mg total) by mouth daily. 30 tablet 0   montelukast (SINGULAIR) 10 MG tablet Take 10 mg by mouth at bedtime.     Multiple Vitamins-Minerals (IMMUNE SUPPORT VITAMIN C PO) Take 1 capsule by mouth daily.     nystatin [facility]) powder Apply topically QID. 60 g 0   ondansetron (ZOFRAN-ODT) 4 MG disintegrating tablet Take 1 tablet (4 mg total) by mouth every 8 (eight) hours as needed. 12 tablet 1   pantoprazole (PROTONIX) 40 MG tablet TAKE ONE TABLET BY MOUTH TWICE A DAY (Patient taking differently: Take 40 mg by mouth 2 (two) times daily.) 30 tablet 1   Perfluorohexyloctane (MIEBO) 1.338 GM/ML SOLN Apply 1 drop to eye 2 (two) times daily.     prazosin (MINIPRESS) 5 MG capsule Take 5 mg by mouth at bedtime.     Prenatal Vit-Fe Fumarate-FA (MULTIVITAMIN-PRENATAL) 27-0.8 MG TABS tablet Take 1 tablet by mouth daily at 12 noon.     promethazine (PHENERGAN) 25 MG tablet Take 25 mg by mouth every 6 (six) hours as needed for nausea or vomiting.     Rimegepant Sulfate (NURTEC) 75 MG TBDP Take 75 mg by mouth daily as needed (migraines).     rosuvastatin (CRESTOR) 20 MG tablet Take 1 tablet (20 mg total) by mouth daily. (Patient taking differently: Take 20 mg by mouth at bedtime.) 30 tablet 6   terconazole (TERAZOL 7) 0.4 % vaginal cream Place 1 applicator vaginally at bedtime as needed (yeast infections).     trospium (SANCTURA) 20 MG  tablet Take 1 tablet (20 mg total) by mouth 2 (two) times daily. 60 tablet 5   valACYclovir (VALTREX) 1000 MG tablet Take 1 tablet (1,000 mg total) by mouth 3 (three) times daily. 30 tablet 0   VITAMIN A PO Take 1 tablet by mouth daily.     No current facility-administered medications on file prior to visit.    Past Surgical History:  Procedure Laterality Date   CHOLECYSTECTOMY, LAPAROSCOPIC  05/13/2010   @[facility]  by dr  m. wakefield   COLECTOMY  48   age 31 months old (bowel obstruction)   COLPOSCOPY N/A 08/29/2023   Procedure: COLPOSCOPY;  Surgeon: Zelphia Cairo, MD;  Location: [facility] [place];  Service: Gynecology;  Laterality: N/A;   INGUINAL HERNIA REPAIR Left 1980   age 55   KNEE CLOSED REDUCTION Right 08/22/2021   Procedure: RIGHT  KNEE MANIPULATION UNDER ANESTHESIA;  Surgeon: Cammy Copa, MD;  Location: [facility];  Service: Orthopedics;  Laterality: Right;   LAPAROSCOPIC ENDOMETRIOSIS FULGURATION  1998   LOOP RECORDER IMPLANT  02/05/2017   @[facility]  by dr f. al-khori;    (08-24-2023  per pt removed approx 2022)   TOTAL KNEE ARTHROPLASTY Right 07/05/2021   Procedure: TOTAL KNEE ARTHROPLASTY;  Surgeon: Cammy Copa, MD;  Location: [facility] OR;  Service: Orthopedics;  Laterality: Right;   WISDOM TOOTH EXTRACTION  2007    Allergies  Allergen Reactions   Macrobid [Nitrofurantoin] Hives and Shortness Of Breath   Metformin And Related Other (See Comments)    Abdominal cramping    Byetta 10 Mcg Pen [Exenatide] Hives   Clindamycin/Lincomycin Nausea And Vomiting   Geodon [Ziprasidone Hcl]     Body starts shutting down   Hydromorphone Itching    Per pt severe   Lipitor [Atorvastatin]     Per patient this causes cramps   Nsaids     Heartburn   Penicillins Other (See Comments)    Childhood reaction Has patient had a PCN reaction causing immediate rash, facial/tongue/throat swelling, SOB or lightheadedness with hypotension: No Has patient had a  PCN reaction causing severe rash involving mucus membranes or skin necrosis: No Has patient had a PCN reaction that required hospitalization No Has patient had a PCN reaction occurring within the last 10 years: No If all of the above answers are "NO", then may proceed with Cephalosporin use.    Trulicity [Dulaglutide]     Abdominal pain    Avocado Diarrhea and Other (See Comments)    Severe cramping, sweats, and diarrhea in upper GI/stomach   Tramadol Hcl Itching    Ht 5\' 1"  (1.549 m)   Wt 202 lb (91.6 kg)   BMI 38.17 kg/m       No data to display              No data to display              Objective:  Physical Exam:  Gen: NAD, comfortable in exam room  Knee exam not repeated today.   Assessment & Plan:  1. Left knee osteoarthritis - fourth orthovisc injection given today.  Follow up with [place] as needed.  After informed written consent timeout was performed, patient was lying supine on exam table. Left knee was prepped with alcohol swab and utilizing superolateral approach with ultrasound guidance, patient's left knee was injected intraarticularly with 3mL lidocaine followed by orthovisc. Patient tolerated the procedure well without immediate complications.

## 2024-02-26 ENCOUNTER — Other Ambulatory Visit (HOSPITAL_COMMUNITY): Payer: Self-pay

## 2024-03-03 ENCOUNTER — Ambulatory Visit: Payer: Self-pay | Admitting: Family Medicine

## 2024-03-03 ENCOUNTER — Ambulatory Visit: Payer: BC Managed Care – PPO | Admitting: Obstetrics

## 2024-03-06 ENCOUNTER — Ambulatory Visit (INDEPENDENT_AMBULATORY_CARE_PROVIDER_SITE_OTHER): Payer: Self-pay | Admitting: Obstetrics and Gynecology

## 2024-03-06 ENCOUNTER — Ambulatory Visit: Payer: Self-pay

## 2024-03-06 ENCOUNTER — Other Ambulatory Visit: Payer: Self-pay

## 2024-03-06 ENCOUNTER — Ambulatory Visit (INDEPENDENT_AMBULATORY_CARE_PROVIDER_SITE_OTHER): Payer: Self-pay | Admitting: Family Medicine

## 2024-03-06 VITALS — BP 126/68 | Ht 61.75 in | Wt 202.0 lb

## 2024-03-06 VITALS — BP 107/74 | HR 74

## 2024-03-06 DIAGNOSIS — N393 Stress incontinence (female) (male): Secondary | ICD-10-CM | POA: Diagnosis not present

## 2024-03-06 DIAGNOSIS — N3941 Urge incontinence: Secondary | ICD-10-CM | POA: Diagnosis not present

## 2024-03-06 DIAGNOSIS — G5603 Carpal tunnel syndrome, bilateral upper limbs: Secondary | ICD-10-CM

## 2024-03-06 DIAGNOSIS — R350 Frequency of micturition: Secondary | ICD-10-CM

## 2024-03-06 MED ORDER — METHYLPREDNISOLONE ACETATE 40 MG/ML IJ SUSP
40.0000 mg | Freq: Once | INTRAMUSCULAR | Status: AC
  Administered 2024-03-06: 40 mg via INTRA_ARTICULAR

## 2024-03-06 MED ORDER — CYCLOBENZAPRINE HCL 10 MG PO TABS
10.0000 mg | ORAL_TABLET | Freq: Three times a day (TID) | ORAL | 1 refills | Status: DC | PRN
  Filled 2024-03-06: qty 60, 20d supply, fill #0

## 2024-03-06 MED ORDER — TROSPIUM CHLORIDE 20 MG PO TABS
20.0000 mg | ORAL_TABLET | Freq: Two times a day (BID) | ORAL | 5 refills | Status: AC
  Filled 2024-03-06: qty 60, 30d supply, fill #0

## 2024-03-06 MED ORDER — CYCLOBENZAPRINE HCL 10 MG PO TABS: 10.0000 mg | ORAL_TABLET | Freq: Three times a day (TID) | ORAL | 1 refills | Status: DC | PRN

## 2024-03-06 NOTE — Progress Notes (Signed)
 PCP: Rada Buerger, MD  Subjective:   HPI: Patient is a 48 y.o. female here for bilateral carpal tunnel syndrome.  Patient reports she's had previous history of carpal tunnel syndrome that was graded moderate back in 2018 and 2019 by nerve conduction studies. She has used wrist braces at times since then and this seemed to improve without requiring surgery. Current problems have been going on for a long time - at least several months - though really more severe for the past month. She went to the ED last week and has been seen twice for this with being given IM toradol  and a steroid, oral steroid dose pack, and muscle relaxant though these records aren't available. Left side is worse than right. Feels left 1-3 digits are numb. She types at work but doesn't feel like she's doing more than usual. Wearing wrist braces currently though only mildly improved with medications she's been recently prescribed. She had a nerve conduction study in march showing moderate carpal tunnel syndrome and recommended surgery though she'd like to try injection on left first.  Past Medical History:  Diagnosis Date   Adopted person    per pt unknown biological medical history   Anticoagulant long-term use    plavix --- managed by  ?neurology or pcp  (stroke prevention)   Asthma    Carpal tunnel syndrome    Cervical dysplasia 07/2023   low grade   Diabetic gastroparesis (HCC)    Dyspnea    08-27-2023  pt stated able to do stairs with minimal sob, household chores w/ no sob   GAD (generalized anxiety disorder)    GERD (gastroesophageal reflux disease)    History of CVA (cerebrovascular accident) 03/25/2016   left MCA ---  without resiudals   History of CVA (cerebrovascular accident) 04/2016   neurologist--  Dr. Fernand Howard ([facility]);;  second cva treated with tpn,  without residials   History of HPV infection    History of small bowel obstruction    age 65 months s/p surgical intervention    Hyperlipidemia    Hypertension    Hypothyroidism    followed by endocrinologist   Insomnia    Insulin  dependent type 2 diabetes mellitus (HCC) 2004   endocrinology--- williams breeante PA;  has insulin  pump w/ novolog    Insulin  pump in place    Irritable bowel syndrome with diarrhea    MDD (major depressive disorder)    sees Dr. Maris Sickle in [place]    Migraine without aura and without status migrainosus, not intractable    se[facility] Fernand Howard [facility]    Morbid obesity [facility])    Multiple personality disorder (HCC)    w/ periods of catatonia   NASH (nonalcoholic steatohepatitis)    followed by [facility]-- dr sheehid   Neuropathy associated with endocrine disorder (HCC)    OA (osteoarthritis)    knees, shoulders, elbows   OSA (obstructive sleep apnea)    08-27-2023  per pt last study approx 4 yrs ago told moderate osa,  no longer uses cpap , not functional , has schduled to have study done 12/ 2024 does not think she has osa any more   PCOS (polycystic ovarian syndrome)    PVC's (premature ventricular contractions)    EP cardiologly-- dr Jearldine Mina   Smokers' cough (HCC)    nonproductive   Wears contact lenses     Current Outpatient Medications on File Prior to Visit  Medication Sig Dispense Refill   acetaminophen  (TYLENOL ) 500  MG tablet Take 1,000 mg by mouth every 6 (six) hours as needed.     albuterol  (PROAIR  HFA) 108 (90 Base) MCG/ACT inhaler Inhale 2 puffs into the lungs every 4 (four) hours as needed for wheezing. 8.5 g 11   budesonide -formoterol  (SYMBICORT ) 160-4.5 MCG/ACT inhaler Inhale 2 puffs into the lungs 2 (two) times daily. 1 Inhaler 11   cetirizine (ZYRTEC) 10 MG tablet Take 50 mg by mouth in the morning, at noon, and at bedtime.     clopidogrel  (PLAVIX ) 75 MG tablet TAKE ONE TABLET BY MOUTH DAILY (Patient taking differently: Take 75 mg by mouth daily.) 90 tablet 0   diclofenac  Sodium (VOLTAREN ) 1 % GEL Apply 1 application topically 2 (two) times daily as  needed (pain).     Galcanezumab-gnlm (EMGALITY) 120 MG/ML SOAJ Inject into the skin every 30 (thirty) days.     Glucagon (BAQSIMI ONE PACK) 3 MG/DOSE POWD Place 3 mg into the nose daily as needed (low blood sugar).     ibuprofen  (ADVIL ) 200 MG tablet Take 800 mg by mouth every 8 (eight) hours as needed.     insulin  aspart (NOVOLOG ) 100 UNIT/ML injection Use in the insulin  pump as directed (Patient taking differently: as directed. Use in the insulin  pump as directed) 10 mL 0   Insulin  Glargine (BASAGLAR  KWIKPEN) 100 UNIT/ML Inject 30 Units into the skin as directed. Per pt when pump being used , as directed by provider do glargine daily     levothyroxine  (SYNTHROID , LEVOTHROID) 125 MCG tablet Take 1 tablet (125 mcg total) by mouth daily before breakfast. 90 tablet 3   linaclotide (LINZESS) 145 MCG CAPS capsule Take 145 mcg by mouth daily before breakfast.     Magnesium  500 MG CAPS Take 1 capsule by mouth daily.     Menthol , Topical Analgesic, (BIOFREEZE) 10 % LIQD Apply 1 application topically 2 (two) times daily as needed (pain).     metoprolol  succinate (TOPROL -XL) 25 MG 24 hr tablet Take 0.5 tablets (12.5 mg total) by mouth daily. 30 tablet 0   montelukast (SINGULAIR) 10 MG tablet Take 10 mg by mouth at bedtime.     Multiple Vitamins-Minerals (IMMUNE SUPPORT VITAMIN C PO) Take 1 capsule by mouth daily.     nystatin  (NYAMYC ) powder Apply topically QID. 60 g 0   ondansetron  (ZOFRAN -ODT) 4 MG disintegrating tablet Take 1 tablet (4 mg total) by mouth every 8 (eight) hours as needed. 12 tablet 1   pantoprazole  (PROTONIX ) 40 MG tablet TAKE ONE TABLET BY MOUTH TWICE A DAY (Patient taking differently: Take 40 mg by mouth 2 (two) times daily.) 30 tablet 1   Perfluorohexyloctane (MIEBO) 1.338 GM/ML SOLN Apply 1 drop to eye 2 (two) times daily.     prazosin  (MINIPRESS ) 5 MG capsule Take 5 mg by mouth at bedtime.     Prenatal Vit-Fe Fumarate-FA (MULTIVITAMIN-PRENATAL) 27-0.8 MG TABS tablet Take 1 tablet by  mouth daily at 12 noon.     promethazine  (PHENERGAN ) 25 MG tablet Take 25 mg by mouth every 6 (six) hours as needed for nausea or vomiting.     Rimegepant Sulfate (NURTEC) 75 MG TBDP Take 75 mg by mouth daily as needed (migraines).     rosuvastatin  (CRESTOR ) 20 MG tablet Take 1 tablet (20 mg total) by mouth daily. (Patient taking differently: Take 20 mg by mouth at bedtime.) 30 tablet 6   terconazole (TERAZOL 7) 0.4 % vaginal cream Place 1 applicator vaginally at bedtime as needed (yeast infections).  trospium  (SANCTURA ) 20 MG tablet Take 1 tablet (20 mg total) by mouth 2 (two) times daily. 60 tablet 5   valACYclovir  (VALTREX ) 1000 MG tablet Take 1 tablet (1,000 mg total) by mouth 3 (three) times daily. 30 tablet 0   VITAMIN A PO Take 1 tablet by mouth daily.     No current facility-administered medications on file prior to visit.    Past Surgical History:  Procedure Laterality Date   CHOLECYSTECTOMY, LAPAROSCOPIC  05/13/2010   @[facility]  by dr m. wakefield   COLECTOMY  1975   age 37 months old (bowel obstruction)   COLPOSCOPY N/A 08/29/2023   Procedure: COLPOSCOPY;  Surgeon: Ashby Lawman, MD;  Location: [facility] [place];  Service: Gynecology;  Laterality: N/A;   INGUINAL HERNIA REPAIR Left 1980   age 38   KNEE CLOSED REDUCTION Right 08/22/2021   Procedure: RIGHT  KNEE MANIPULATION UNDER ANESTHESIA;  Surgeon: Jasmine Mesi, MD;  Location: [facility];  Service: Orthopedics;  Laterality: Right;   LAPAROSCOPIC ENDOMETRIOSIS FULGURATION  1998   LOOP RECORDER IMPLANT  02/05/2017   @[facility]  by dr f. al-khori;    (08-24-2023  per pt removed approx 2022)   TOTAL KNEE ARTHROPLASTY Right 07/05/2021   Procedure: TOTAL KNEE ARTHROPLASTY;  Surgeon: Jasmine Mesi, MD;  Location: [facility] OR;  Service: Orthopedics;  Laterality: Right;   WISDOM TOOTH EXTRACTION  2007    Allergies  Allergen Reactions   Macrobid [Nitrofurantoin] Hives and Shortness Of Breath   Metformin   And Related Other (See Comments)    Abdominal cramping    Byetta 10 Mcg Pen [Exenatide] Hives   Clindamycin/Lincomycin Nausea And Vomiting   Geodon [Ziprasidone Hcl]     Body starts shutting down   Hydromorphone  Itching    Per pt severe   Lipitor [Atorvastatin ]     Per patient this causes cramps   Nsaids     Heartburn   Penicillins Other (See Comments)    Childhood reaction Has patient had a PCN reaction causing immediate rash, facial/tongue/throat swelling, SOB or lightheadedness with hypotension: No Has patient had a PCN reaction causing severe rash involving mucus membranes or skin necrosis: No Has patient had a PCN reaction that required hospitalization No Has patient had a PCN reaction occurring within the last 10 years: No If all of the above answers are "NO", then may proceed with Cephalosporin use.    Trulicity [Dulaglutide]     Abdominal pain    Avocado Diarrhea and Other (See Comments)    Severe cramping, sweats, and diarrhea in upper GI/stomach   Tramadol Hcl Itching    BP 126/68   Ht 5' 1.75" (1.568 m)   Wt 202 lb (91.6 kg)   BMI 37.25 kg/m       No data to display              No data to display              Objective:  Physical Exam:  Gen: NAD, comfortable in exam room  Bilateral wrists: No deformity, swelling, bruising. FROM with 5/5 strength finger abduction, extension, thumb opposition. Tenderness to palpation L > R carpal tunnel. Sensation diminished 2nd digit left hand. Positive tinels carpal tunnel.  Limited MSK u/s bilateral wrists:  Median nerve volume 0.14cm2 bilaterally prior to entry into carpal tunnel.    Assessment & Plan:  1. Bilateral carpal tunnel syndrome - confirmed with ultrasound.  Recent nerve conduction study grading this as moderate.  More  symptomatic on left.  She would like to try injection on this side prior to considering surgical intervention.  We discussed concerns regarding possible permanent long term issues  with greater severity of carpal tunnel and her longstanding history of this.  Continue wrist braces at nighttime and consider during the day.  She feels the muscle relaxant has helped so will continue this (she questions element of cervical muscle issue which may be contributory).  Follow up with us  in 1 month.  She's had steroid flares with her last 2 intraarticular knee injections so advised to call us  if notes something similar and we can send in some pain medication.  After informed written consent timeout was performed.   Patient was lying supine on exam table.  Area overlying left carpal tunnel prepped with alcohol swabs.  Then utilizing ultrasound guidance patient's left carpal tunnel was injected with 2:1 marcaine : depomedrol.  Patient tolerated procedure well without immediate complications.

## 2024-03-06 NOTE — Progress Notes (Signed)
 follow

## 2024-03-06 NOTE — Progress Notes (Signed)
 [facility] Return Visit  SUBJECTIVE  History of Present Illness: Miranda [name] is a 48 y.o. female seen in follow-up for OAB and SUI.  Patient is currently unhoused and living in a tent. They state they have applied for Medicaid and that is in process. Patient is reportedly unable to afford the Trospium  20mg  x2 daily at this time but feels it was helping her significantly.     Past Medical History: Patient  has a past medical history of Adopted person, Anticoagulant long-term use, Asthma, Carpal tunnel syndrome, Cervical dysplasia (07/2023), Diabetic gastroparesis (HCC), Dyspnea, GAD (generalized anxiety disorder), GERD (gastroesophageal reflux disease), History of CVA (cerebrovascular accident) (03/25/2016), History of CVA (cerebrovascular accident) (04/2016), History of HPV infection, History of small bowel obstruction, Hyperlipidemia, Hypertension, Hypothyroidism, Insomnia, Insulin  dependent type 2 diabetes mellitus (HCC) (2004), Insulin  pump in place, Irritable bowel syndrome with diarrhea, MDD (major depressive disorder), Migraine without aura and without status migrainosus, not intractable, Morbid obesity (HCC), Multiple personality disorder (HCC), NASH (nonalcoholic steatohepatitis), Neuropathy associated with endocrine disorder (HCC), OA (osteoarthritis), OSA (obstructive sleep apnea), PCOS (polycystic ovarian syndrome), PVC's (premature ventricular contractions), Smokers' cough (HCC), and Wears contact lenses.   Past Surgical History: She  has a past surgical history that includes Inguinal hernia repair (Left, 1980); Cholecystectomy, laparoscopic (05/13/2010); Laparoscopic endometriosis fulguration (1998); Wisdom tooth extraction (2007); Total knee arthroplasty (Right, 07/05/2021); Knee Closed Reduction (Right, 08/22/2021); Colectomy (1975); Loop recorder implant (02/05/2017); and Colposcopy (N/A, 08/29/2023).   Medications: She has a current medication list which  includes the following prescription(s): acetaminophen , albuterol , budesonide -formoterol , cetirizine, clopidogrel , diclofenac  sodium, emgality, baqsimi one pack, ibuprofen , insulin  aspart, basaglar  kwikpen, levothyroxine , linaclotide, magnesium , biofreeze, metoprolol  succinate, montelukast, multiple vitamins-minerals, nystatin , ondansetron , pantoprazole , miebo, prazosin , multivitamin-prenatal, promethazine , nurtec, rosuvastatin , terconazole, valacyclovir , vitamin a, cyclobenzaprine , and trospium .   Allergies: Patient is allergic to macrobid [nitrofurantoin], metformin  and related, byetta 10 mcg pen [exenatide], clindamycin/lincomycin, geodon [ziprasidone hcl], hydromorphone , lipitor [atorvastatin ], nsaids, penicillins, trulicity [dulaglutide], avocado, and tramadol hcl.   Social History: Patient  reports that she has been smoking cigarettes. She has never used smokeless tobacco. She reports current alcohol use. She reports that she does not currently use drugs after having used the following drugs: Marijuana.     OBJECTIVE     Physical Exam: Vitals:   03/06/24 1255  BP: 107/74  Pulse: 74   Gen: No apparent distress, A&O x 3.  Detailed Urogynecologic Evaluation:  Deferred.   ASSESSMENT AND PLAN    Miranda [name] is a 48 y.o. with:  1. SUI (stress urinary incontinence, female)   2. Urinary frequency   3. Urge incontinence of urine    Patient is still having significant SUI. They would like to pursue bulking, but needs to wait until medicaid provides coverage. They encourage us  to call them in 1 month to see if insurance has begun.  Patient to continue Trospium  60mg  ER daily. They feels this has been helping some with their urgency and frequency.  Continue Trospium  20mg  x2 daily. They also requested that the muscle relaxant she needs to pickup be sent to the community pharmacy where she can get her medication cheaper.   Patient to call with insurance information otherwise we can see them  back in 6 months or sooner if needed.    Miranda Catalfamo G Lawrie Tunks, NP

## 2024-03-13 ENCOUNTER — Encounter: Payer: Self-pay | Admitting: Obstetrics and Gynecology

## 2024-03-13 ENCOUNTER — Other Ambulatory Visit: Payer: Self-pay

## 2024-03-17 ENCOUNTER — Other Ambulatory Visit: Payer: Self-pay

## 2024-05-01 ENCOUNTER — Encounter: Payer: Self-pay | Admitting: Family Medicine

## 2024-05-01 ENCOUNTER — Ambulatory Visit: Admitting: Family Medicine

## 2024-05-01 ENCOUNTER — Other Ambulatory Visit: Payer: Self-pay | Admitting: *Deleted

## 2024-05-01 ENCOUNTER — Other Ambulatory Visit: Payer: Self-pay

## 2024-05-01 VITALS — BP 118/71 | Ht 61.75 in | Wt 202.0 lb

## 2024-05-01 DIAGNOSIS — M25531 Pain in right wrist: Secondary | ICD-10-CM

## 2024-05-01 MED ORDER — DICLOFENAC SODIUM 1 % EX GEL: 2.0000 g | Freq: Four times a day (QID) | CUTANEOUS | 1 refills | Status: AC

## 2024-05-01 MED ORDER — DICLOFENAC SODIUM 1 % EX GEL: 2.0000 g | Freq: Four times a day (QID) | CUTANEOUS | Status: DC

## 2024-05-01 NOTE — Addendum Note (Signed)
 Addended by: TERESSA RAINELL BROCKS on: 05/01/2024 06:18 PM   Modules accepted: Level of Service

## 2024-05-01 NOTE — Progress Notes (Signed)
 DATE OF VISIT: 05/01/2024        Miranda [name] DOB: 1976/08/22 MRN: [id number]  CC: Right hand pain  History of present Illness: Miranda [name] is a 48 y.o. female who presents for a follow-up visit for right hand pain Last seen by Dr. Lonie 03/06/2024 for bilateral carpal tunnel syndrome - Underwent left carpal tunnel injection at that time After that visit she notes she was doing well Sustained a cat bite to her right thumb 04/10/2024 - Was evaluated at urgent care, -note reviewed during the visit today - Was given a tetanus vaccine and a course of antibiotics About a week later was hospitalized for migraine with aura She notes over the last several days has been having increasing right thumb/hand pain Denies any swelling Denies any increased redness or warmth Denies any fevers/chills/night sweats Been using wrist splint intermittently [place] a lot of repetitive activities with her hands, also washing her hands regularly 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   albuterol  (PROAIR  HFA) 108 (90 Base) MCG/ACT inhaler Inhale 2 puffs into the lungs every 4 (four) hours as needed for wheezing.   budesonide -formoterol  (SYMBICORT ) 160-4.5 MCG/ACT inhaler Inhale 2 puffs into the lungs 2 (two) times daily.   cetirizine (ZYRTEC) 10 MG tablet Take 50 mg by mouth in the morning, at noon, and at bedtime.   clopidogrel  (PLAVIX ) 75 MG tablet TAKE ONE TABLET BY MOUTH DAILY (Patient taking differently: Take 75 mg by mouth daily.)    cyclobenzaprine  (FLEXERIL ) 10 MG tablet Take 1 tablet (10 mg total) by mouth 3 (three) times daily as needed for muscle spasms.   diclofenac  Sodium (VOLTAREN ) 1 % GEL Apply 1 application topically 2 (two) times daily as needed (pain).   Galcanezumab-gnlm (EMGALITY) 120 MG/ML SOAJ Inject into the skin every 30 (thirty) days.   Glucagon (BAQSIMI ONE PACK) 3 MG/DOSE POWD Place 3 mg into the nose daily as needed (low blood sugar).   ibuprofen  (ADVIL ) 200 MG tablet Take 800 mg by mouth every 8 (eight) hours as needed.   insulin  aspart (NOVOLOG ) 100 UNIT/ML injection Use in the insulin  pump as directed (Patient taking differently: as directed. Use in the insulin  pump as directed)   Insulin  Glargine (BASAGLAR  KWIKPEN) 100 UNIT/ML Inject 30 Units into the skin as directed. Per pt when pump being used , as directed by provider do glargine daily   levothyroxine  (SYNTHROID , LEVOTHROID) 125 MCG tablet Take 1 tablet (125 mcg total) by mouth daily before breakfast.   linaclotide (LINZESS) 145 MCG CAPS capsule Take 145 mcg by mouth daily before breakfast.   Magnesium  500 MG CAPS Take 1 capsule by mouth daily.   Menthol , Topical Analgesic, (BIOFREEZE) 10 % LIQD Apply 1 application topically 2 (two) times daily as needed (pain).   metoprolol  succinate (TOPROL -XL) 25 MG 24 hr tablet Take 0.5 tablets (12.5 mg total) by mouth daily.   montelukast (SINGULAIR) 10 MG tablet Take 10 mg by mouth at bedtime.   Multiple Vitamins-Minerals (IMMUNE SUPPORT VITAMIN  C PO) Take 1 capsule by mouth daily.   nystatin  (NYAMYC ) powder Apply topically QID.   ondansetron  (ZOFRAN -ODT) 4 MG disintegrating tablet Take 1 tablet (4 mg total) by mouth every 8 (eight) hours as needed.   pantoprazole  (PROTONIX ) 40 MG tablet TAKE ONE TABLET BY MOUTH TWICE A DAY (Patient taking differently: Take 40 mg by mouth 2 (two) times daily.)   Perfluorohexyloctane (MIEBO) 1.338 GM/ML SOLN Apply 1 drop to eye 2 (two) times daily.   prazosin  (MINIPRESS ) 5  MG capsule Take 5 mg by mouth at bedtime.   Prenatal Vit-Fe Fumarate-FA (MULTIVITAMIN-PRENATAL) 27-0.8 MG TABS tablet Take 1 tablet by mouth daily at 12 noon.   promethazine  (PHENERGAN ) 25 MG tablet Take 25 mg by mouth every 6 (six) hours as needed for nausea or vomiting.   Rimegepant Sulfate (NURTEC) 75 MG TBDP Take 75 mg by mouth daily as needed (migraines).   rosuvastatin  (CRESTOR ) 20 MG tablet Take 1 tablet (20 mg total) by mouth daily. (Patient taking differently: Take 20 mg by mouth at bedtime.)   terconazole (TERAZOL 7) 0.4 % vaginal cream Place 1 applicator vaginally at bedtime as needed (yeast infections).   trospium  (SANCTURA ) 20 MG tablet Take 1 tablet (20 mg total) by mouth 2 (two) times daily.   valACYclovir  (VALTREX ) 1000 MG tablet Take 1 tablet (1,000 mg total) by mouth 3 (three) times daily.   VITAMIN A PO Take 1 tablet by mouth daily.   [DISCONTINUED] diclofenac  Sodium (VOLTAREN ) 1 % topical gel 2 g    No facility-administered encounter medications on file as of 05/01/2024.    Allergies: is allergic to macrobid [nitrofurantoin], metformin  and related, byetta 10 mcg pen [exenatide], clindamycin/lincomycin, geodon [ziprasidone hcl], hydromorphone , lipitor [atorvastatin ], nsaids, penicillins, trulicity [dulaglutide], avocado, and tramadol hcl.  Physical Examination: Vitals: BP 118/71 (BP Location: Right Arm, Patient Position: Sitting)   Ht 5' 1.75 (1.568 m)   Wt 202 lb (91.6 kg)   BMI 37.25 kg/m  GENERAL:  Miranda [name] is a 48 y.o. female appearing their stated age, alert and oriented x 3, in no apparent distress.  SKIN: no rashes or lesions, skin clean, dry, intact MSK: Hand/wrist: Right hand and wrist without any swelling or effusion.  No increased redness or warmth.  Has well-healed puncture wounds along the right thumb in the palm of the right hand from recent cat bite.  Full range of motion of the wrist with some pain in terminal flexion and extension.  No bony  tenderness throughout the carpals, metacarpals, phalanges.  Mildly positive Finkelstein's.  Normal grip strength.  Wrist strength 5 -/5 throughout.  No tenderness of the TFCC. Left hand and wrist with full range of motion without pain, weakness, instability NEURO: sensation intact to light touch upper extremity bilaterally VASC: pulses 2+ and symmetric radial artery bilaterally, no edema  Radiology: Limited MSK ultrasound right wrist Date: 05/01/2024 Indication: Right wrist pain Findings: - Right wrist joint without any swelling or effusion - Normal-appearing first extensor compartment - Normal-appearing second extensor compartment and normal appearing intersection of compartments 1 and 2 - Small amount of increased fluid in the extensor compartment 3 and 4.  No increased Doppler flow.  No abnormalities of the tendons - Normal-appearing extensor compartments 5 and 6 - Mild degenerative changes noted along the first [facility] joint and first MCP joint.  No significant effusion.  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  No signs  of tenosynovitis or findings suspicious of infection Images and interpretation completed by Rainell Cedar, DO    Assessment & Plan Wrist pain, right Acute right dorsal wrist pain with underlying history of carpal tunnel syndrome.  Current symptoms do not seem consistent with carpal tunnel.  She did have recent cat bite approximately 2 weeks ago, no signs of infection on physical exam or ultrasound today.  Suspect symptoms related to altered wrist/hand mechanics from the cat bite  Plan: - Urgent care notes and hospital notes reviewed during the visit today - Ultrasound completed with findings as noted above - Recommend she use Voltaren  gel 3-4 times a day as needed.  Cannot take oral NSAIDs due to GERD - Should continue to use her wrist splint.  Recommend using at bedtime, can use  throughout the day as needed - She will continue her other chronic medications including Lyrica and Flexeril  at dosages prescribed - Will ice as needed - Will follow-up in 1 to 2 weeks if no improvement, sooner as needed   Patient expressed understanding & agreement with above.  Encounter Diagnosis  Name Primary?   Wrist pain, right Yes    Orders Placed This Encounter  Procedures   US  LIMITED JOINT SPACE STRUCTURES UP RIGHT

## 2024-05-06 ENCOUNTER — Other Ambulatory Visit: Payer: Self-pay | Admitting: Family Medicine

## 2024-05-30 ENCOUNTER — Ambulatory Visit (INDEPENDENT_AMBULATORY_CARE_PROVIDER_SITE_OTHER): Admitting: Family Medicine

## 2024-05-30 VITALS — BP 123/66 | Ht 61.75 in | Wt 182.0 lb

## 2024-05-30 DIAGNOSIS — M1712 Unilateral primary osteoarthritis, left knee: Secondary | ICD-10-CM

## 2024-05-30 DIAGNOSIS — M79601 Pain in right arm: Secondary | ICD-10-CM

## 2024-05-30 DIAGNOSIS — R200 Anesthesia of skin: Secondary | ICD-10-CM

## 2024-05-30 MED ORDER — PREDNISONE 10 MG PO TABS
ORAL_TABLET | ORAL | 0 refills | Status: DC
Start: 2024-05-30 — End: 2024-09-19

## 2024-05-30 MED ORDER — CYCLOBENZAPRINE HCL 10 MG PO TABS: 10.0000 mg | ORAL_TABLET | Freq: Three times a day (TID) | ORAL | 1 refills | Status: DC | PRN

## 2024-05-30 NOTE — Patient Instructions (Addendum)
 Your wrist pain is due to a combination of things - irritation of median and ulnar nerves, tendinitis of the thumb, and trigger thumb. Wear the thumb spica brace when you sleep and as often as possible during the day. Prednisone  dose pack x 6 days. Flexeril  as needed. The prednisone  should help with your knee also. We can consider the gel series again if this was beneficial for your knee.

## 2024-06-01 NOTE — Progress Notes (Signed)
 PCP: Alray Loader, MD  Subjective:   HPI: Patient is a 48 y.o. female here for right hand, left knee pain.  Patient reports injection for left carpal tunnel syndrome helped. Current issue with right hand involves numbness in entire right hand. Aching yesterday as well with triggering of her right thumb. Has tried ice/heat, tylenol . Wearing wrist brace as well. For left knee this has worsened again. She did trip last week though did not fall. Knee has felt better with weight loss up until recently. Had steroid flare with a couple intraarticular injections and does not want to do this again.  Past Medical History:  Diagnosis Date   Adopted person    per pt unknown biological medical history   Anticoagulant long-term use    plavix --- managed by  ?neurology or pcp  (stroke prevention)   Asthma    Carpal tunnel syndrome    Cervical dysplasia 07/2023   low grade   Diabetic gastroparesis (HCC)    Dyspnea    08-27-2023  pt stated able to do stairs with minimal sob, household chores w/ no sob   GAD (generalized anxiety disorder)    GERD (gastroesophageal reflux disease)    History of CVA (cerebrovascular accident) 03/25/2016   left MCA ---  without resiudals   History of CVA (cerebrovascular accident) 04/2016   neurologist--  Dr. Greig Pellant ([facility]);;  second cva treated with tpn,  without residials   History of HPV infection    History of small bowel obstruction    age 92 months s/p surgical intervention   Hyperlipidemia    Hypertension    Hypothyroidism    followed by endocrinologist   Insomnia    Insulin  dependent type 2 diabetes mellitus (HCC) 2004   endocrinology--- williams breeante PA;  has insulin  pump w/ novolog    Insulin  pump in place    Irritable bowel syndrome with diarrhea    MDD (major depressive disorder)    sees Dr. Wolm Netters in [place]    Migraine without aura and without status migrainosus, not intractable    se[facility] 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   NASH (nonalcoholic steatohepatitis)    followed by [facility]-- dr sheehid   Neuropathy associated with endocrine disorder (HCC)    OA (osteoarthritis)    knees, shoulders, elbows   OSA (obstructive sleep apnea)    08-27-2023  per pt last study approx 4 yrs ago told moderate osa,  no longer uses cpap , not functional , has schduled to have study done 12/ 2024 does not think she has osa any more   PCOS (polycystic ovarian syndrome)    PVC's (premature ventricular contractions)    EP cardiologly-- dr meldon   Smokers' cough (HCC)    nonproductive   Wears contact lenses     Current Outpatient Medications on File Prior to Visit  Medication Sig Dispense Refill   acetaminophen  (TYLENOL ) 500 MG tablet Take 1,000 mg by mouth every 6 (six) hours as needed.     albuterol  (PROAIR  HFA) 108 (90 Base) MCG/ACT inhaler Inhale 2 puffs into the lungs every 4 (four) hours as needed for wheezing. 8.5 g 11   budesonide -formoterol  (SYMBICORT ) 160-4.5 MCG/ACT inhaler Inhale 2 puffs into the lungs 2 (two) times daily. 1 Inhaler 11   cetirizine (ZYRTEC) 10 MG tablet Take 50 mg by mouth in the morning, at noon, and  at bedtime.     clopidogrel  (PLAVIX ) 75 MG tablet TAKE ONE TABLET BY MOUTH DAILY (Patient taking differently: Take 75 mg by mouth daily.) 90 tablet 0   diclofenac  Sodium (VOLTAREN ) 1 % GEL Apply 1 application topically 2 (two) times daily as needed (pain).     diclofenac  Sodium (VOLTAREN ) 1 % GEL Apply 2 g topically 4 (four) times daily. 120 g 1   Galcanezumab-gnlm (EMGALITY) 120 MG/ML SOAJ Inject into the skin every 30 (thirty) days.     Glucagon (BAQSIMI ONE PACK) 3 MG/DOSE POWD Place 3 mg into the nose daily as needed (low blood sugar).     ibuprofen  (ADVIL ) 200 MG tablet Take 800 mg by mouth every 8 (eight) hours as needed.     insulin  aspart (NOVOLOG ) 100 UNIT/ML injection Use in the insulin  pump as directed  (Patient taking differently: as directed. Use in the insulin  pump as directed) 10 mL 0   Insulin  Glargine (BASAGLAR  KWIKPEN) 100 UNIT/ML Inject 30 Units into the skin as directed. Per pt when pump being used , as directed by provider do glargine daily     levothyroxine  (SYNTHROID , LEVOTHROID) 125 MCG tablet Take 1 tablet (125 mcg total) by mouth daily before breakfast. 90 tablet 3   linaclotide (LINZESS) 145 MCG CAPS capsule Take 145 mcg by mouth daily before breakfast.     Magnesium  500 MG CAPS Take 1 capsule by mouth daily.     Menthol , Topical Analgesic, (BIOFREEZE) 10 % LIQD Apply 1 application topically 2 (two) times daily as needed (pain).     metoprolol  succinate (TOPROL -XL) 25 MG 24 hr tablet Take 0.5 tablets (12.5 mg total) by mouth daily. 30 tablet 0   montelukast (SINGULAIR) 10 MG tablet Take 10 mg by mouth at bedtime.     Multiple Vitamins-Minerals (IMMUNE SUPPORT VITAMIN C PO) Take 1 capsule by mouth daily.     nystatin  (NYAMYC ) powder Apply topically QID. 60 g 0   ondansetron  (ZOFRAN -ODT) 4 MG disintegrating tablet Take 1 tablet (4 mg total) by mouth every 8 (eight) hours as needed. 12 tablet 1   pantoprazole  (PROTONIX ) 40 MG tablet TAKE ONE TABLET BY MOUTH TWICE A DAY (Patient taking differently: Take 40 mg by mouth 2 (two) times daily.) 30 tablet 1   Perfluorohexyloctane (MIEBO) 1.338 GM/ML SOLN Apply 1 drop to eye 2 (two) times daily.     prazosin  (MINIPRESS ) 5 MG capsule Take 5 mg by mouth at bedtime.     Prenatal Vit-Fe Fumarate-FA (MULTIVITAMIN-PRENATAL) 27-0.8 MG TABS tablet Take 1 tablet by mouth daily at 12 noon.     promethazine  (PHENERGAN ) 25 MG tablet Take 25 mg by mouth every 6 (six) hours as needed for nausea or vomiting.     Rimegepant Sulfate (NURTEC) 75 MG TBDP Take 75 mg by mouth daily as needed (migraines).     rosuvastatin  (CRESTOR ) 20 MG tablet Take 1 tablet (20 mg total) by mouth daily. (Patient taking differently: Take 20 mg by mouth at bedtime.) 30 tablet 6    terconazole (TERAZOL 7) 0.4 % vaginal cream Place 1 applicator vaginally at bedtime as needed (yeast infections).     trospium  (SANCTURA ) 20 MG tablet Take 1 tablet (20 mg total) by mouth 2 (two) times daily. 60 tablet 5   valACYclovir  (VALTREX ) 1000 MG tablet Take 1 tablet (1,000 mg total) by mouth 3 (three) times daily. 30 tablet 0   VITAMIN A PO Take 1 tablet by mouth daily.     No current facility-administered  medications on file prior to visit.    Past Surgical History:  Procedure Laterality Date   CHOLECYSTECTOMY, LAPAROSCOPIC  05/13/2010   @[facility]  by dr m. wakefield   COLECTOMY  1975   age 20 months old (bowel obstruction)   COLPOSCOPY N/A 08/29/2023   Procedure: COLPOSCOPY;  Surgeon: Latisha Medford, MD;  Location: [facility];  Service: Gynecology;  Laterality: N/A;   INGUINAL HERNIA REPAIR Left 1980   age 56   KNEE CLOSED REDUCTION Right 08/22/2021   Procedure: RIGHT  KNEE MANIPULATION UNDER ANESTHESIA;  Surgeon: Addie Cordella Hamilton, MD;  Location: [facility];  Service: Orthopedics;  Laterality: Right;   LAPAROSCOPIC ENDOMETRIOSIS FULGURATION  1998   LOOP RECORDER IMPLANT  02/05/2017   @[facility]  by dr f. al-khori;    (08-24-2023  per pt removed approx 2022)   TOTAL KNEE ARTHROPLASTY Right 07/05/2021   Procedure: TOTAL KNEE ARTHROPLASTY;  Surgeon: Addie Cordella Hamilton, MD;  Location: [facility];  Service: Orthopedics;  Laterality: Right;   WISDOM TOOTH EXTRACTION  2007    Allergies  Allergen Reactions   Macrobid [Nitrofurantoin] Hives and Shortness Of Breath   Metformin  And Related Other (See Comments)    Abdominal cramping    Byetta 10 Mcg Pen [Exenatide] Hives   Clindamycin/Lincomycin Nausea And Vomiting   Geodon [Ziprasidone Hcl]     Body starts shutting down   Hydromorphone  Itching    Per pt severe   Lipitor [Atorvastatin ]     Per patient this causes cramps   Nsaids     Heartburn   Penicillins Other (See Comments)    Childhood reaction Has  patient had a PCN reaction causing immediate rash, facial/tongue/throat swelling, SOB or lightheadedness with hypotension: No Has patient had a PCN reaction causing severe rash involving mucus membranes or skin necrosis: No Has patient had a PCN reaction that required hospitalization No Has patient had a PCN reaction occurring within the last 10 years: No If all of the above answers are NO, then may proceed with Cephalosporin use.    Trulicity [Dulaglutide]     Abdominal pain    Avocado Diarrhea and Other (See Comments)    Severe cramping, sweats, and diarrhea in upper GI/stomach   Tramadol Hcl Itching    BP 123/66   Ht 5' 1.75 (1.568 m)   Wt 182 lb (82.6 kg)   BMI 33.56 kg/m       No data to display              No data to display              Objective:  Physical Exam:  Gen: NAD, comfortable in exam room  Right hand/wrist: No deformity, swelling, bruising. FROM with 5/5 strength finger abduction, extension, thumb opposition. Mild tenderness to palpation over carpal tunnel, 1st dorsal compartment. NVI distally. Negative tinels carpal tunnel.   Assessment & Plan:  1. Right hand/wrist pain - with numbness but nonanatomic distribution.  Describes triggering of thumb as well.  Consistent with irritation of median and ulnar nerves, de quervains, and trigger thumb.  Wear thumb spice.  Prednisone  dose pack and flexeril  as needed.    2. Left knee pain - has known arthritis.  Declined steroid injection as she's had flares with this the past 2 times she's received.  Consider viscosupplementation.

## 2024-06-05 ENCOUNTER — Encounter: Payer: Self-pay | Admitting: Family Medicine

## 2024-06-18 ENCOUNTER — Ambulatory Visit: Admitting: Family Medicine

## 2024-06-18 ENCOUNTER — Other Ambulatory Visit: Payer: Self-pay

## 2024-06-18 VITALS — BP 124/71 | Ht 61.75 in | Wt 185.0 lb

## 2024-06-18 DIAGNOSIS — M6530 Trigger finger, unspecified finger: Secondary | ICD-10-CM

## 2024-06-18 DIAGNOSIS — M1712 Unilateral primary osteoarthritis, left knee: Secondary | ICD-10-CM

## 2024-06-18 MED ORDER — METHYLPREDNISOLONE ACETATE 40 MG/ML IJ SUSP
20.0000 mg | Freq: Once | INTRAMUSCULAR | Status: AC
  Administered 2024-06-18: 20 mg via INTRA_ARTICULAR

## 2024-06-18 NOTE — Patient Instructions (Signed)
 You were given a trigger finger injection today. Do the band-aid splinting for the next week. Heat as needed 15 minutes at a time. We will look into the gel injection(s) for your left knee. Take tylenol  500mg  1-2 tabs three times a day.

## 2024-06-19 NOTE — Progress Notes (Signed)
 PCP: Alray Loader, MD  Subjective:   HPI: Patient is a 48 y.o. female here for right thumb, left knee pain.  Patient reports most of the symptoms in her right hand have improved. However right thumb is painful and stuck, difficult to flex and grip things. Her left knee is still painful - taking tylenol  2 tabs twice a day.  Past Medical History:  Diagnosis Date   Adopted person    per pt unknown biological medical history   Anticoagulant long-term use    plavix --- managed by  ?neurology or pcp  (stroke prevention)   Asthma    Carpal tunnel syndrome    Cervical dysplasia 07/2023   low grade   Diabetic gastroparesis (HCC)    Dyspnea    08-27-2023  pt stated able to do stairs with minimal sob, household chores w/ no sob   GAD (generalized anxiety disorder)    GERD (gastroesophageal reflux disease)    History of CVA (cerebrovascular accident) 03/25/2016   left MCA ---  without resiudals   History of CVA (cerebrovascular accident) 04/2016   neurologist--  Dr. Greig Pellant ([facility];  second cva treated with tpn,  without residials   History of HPV infection    History of small bowel obstruction    age 46 months s/p surgical intervention   Hyperlipidemia    Hypertension    Hypothyroidism    followed by endocrinologist   Insomnia    Insulin  dependent type 2 diabetes mellitus (HCC) 2004   endocrinology--- williams breeante PA;  has insulin  pump w/ novolog    Insulin  pump in place    Irritable bowel syndrome with diarrhea    MDD (major depressive disorder)    sees Dr. Wolm Netters in [place]    Migraine without aura and without status migrainosus, not intractable    sees Dr. Greig Pellant at [facility]    Morbid obesity [facility])    Multiple personality disorder (HCC)    w/ periods of catatonia   NASH (nonalcoholic steatohepatitis)    followed by [facility]-- dr sheehid   Neuropathy associated with endocrine disorder (HCC)    OA (osteoarthritis)    knees, shoulders, elbows    OSA (obstructive sleep apnea)    08-27-2023  per pt last study approx 4 yrs ago told moderate osa,  no longer uses cpap , not functional , has schduled to have study done 12/ 2024 does not think she has osa any more   PCOS (polycystic ovarian syndrome)    PVC's (premature ventricular contractions)    EP cardiologly-- dr meldon   Smokers' cough (HCC)    nonproductive   Wears contact lenses     Current Outpatient Medications on File Prior to Visit  Medication Sig Dispense Refill   acetaminophen  (TYLENOL ) 500 MG tablet Take 1,000 mg by mouth every 6 (six) hours as needed.     albuterol  (PROAIR  HFA) 108 (90 Base) MCG/ACT inhaler Inhale 2 puffs into the lungs every 4 (four) hours as needed for wheezing. 8.5 g 11   budesonide -formoterol  (SYMBICORT ) 160-4.5 MCG/ACT inhaler Inhale 2 puffs into the lungs 2 (two) times daily. 1 Inhaler 11   cetirizine (ZYRTEC) 10 MG tablet Take 50 mg by mouth in the morning, at noon, and at bedtime.     clopidogrel  (PLAVIX ) 75 MG tablet TAKE ONE TABLET BY MOUTH DAILY (Patient taking differently: Take 75 mg by mouth daily.) 90 tablet 0   cyclobenzaprine  (FLEXERIL ) 10 MG tablet Take 1 tablet (10 mg total)  by mouth 3 (three) times daily as needed for muscle spasms. 60 tablet 1   diclofenac  Sodium (VOLTAREN ) 1 % GEL Apply 1 application topically 2 (two) times daily as needed (pain).     diclofenac  Sodium (VOLTAREN ) 1 % GEL Apply 2 g topically 4 (four) times daily. 120 g 1   Galcanezumab-gnlm (EMGALITY) 120 MG/ML SOAJ Inject into the skin every 30 (thirty) days.     Glucagon (BAQSIMI ONE PACK) 3 MG/DOSE POWD Place 3 mg into the nose daily as needed (low blood sugar).     ibuprofen  (ADVIL ) 200 MG tablet Take 800 mg by mouth every 8 (eight) hours as needed.     insulin  aspart (NOVOLOG ) 100 UNIT/ML injection Use in the insulin  pump as directed (Patient taking differently: as directed. Use in the insulin  pump as directed) 10 mL 0   Insulin  Glargine (BASAGLAR  KWIKPEN) 100  UNIT/ML Inject 30 Units into the skin as directed. Per pt when pump being used , as directed by provider do glargine daily     levothyroxine  (SYNTHROID , LEVOTHROID) 125 MCG tablet Take 1 tablet (125 mcg total) by mouth daily before breakfast. 90 tablet 3   linaclotide (LINZESS) 145 MCG CAPS capsule Take 145 mcg by mouth daily before breakfast.     Magnesium  500 MG CAPS Take 1 capsule by mouth daily.     Menthol , Topical Analgesic, (BIOFREEZE) 10 % LIQD Apply 1 application topically 2 (two) times daily as needed (pain).     metoprolol  succinate (TOPROL -XL) 25 MG 24 hr tablet Take 0.5 tablets (12.5 mg total) by mouth daily. 30 tablet 0   montelukast (SINGULAIR) 10 MG tablet Take 10 mg by mouth at bedtime.     Multiple Vitamins-Minerals (IMMUNE SUPPORT VITAMIN C PO) Take 1 capsule by mouth daily.     nystatin  (NYAMYC ) powder Apply topically QID. 60 g 0   ondansetron  (ZOFRAN -ODT) 4 MG disintegrating tablet Take 1 tablet (4 mg total) by mouth every 8 (eight) hours as needed. 12 tablet 1   pantoprazole  (PROTONIX ) 40 MG tablet TAKE ONE TABLET BY MOUTH TWICE A DAY (Patient taking differently: Take 40 mg by mouth 2 (two) times daily.) 30 tablet 1   Perfluorohexyloctane (MIEBO) 1.338 GM/ML SOLN Apply 1 drop to eye 2 (two) times daily.     prazosin  (MINIPRESS ) 5 MG capsule Take 5 mg by mouth at bedtime.     predniSONE  (DELTASONE ) 10 MG tablet 6 tabs po day 1, 5 tabs po day 2, 4 tabs po day 3, 3 tabs po day 4, 2 tabs po day 5, 1 tab po day 6 21 tablet 0   Prenatal Vit-Fe Fumarate-FA (MULTIVITAMIN-PRENATAL) 27-0.8 MG TABS tablet Take 1 tablet by mouth daily at 12 noon.     promethazine  (PHENERGAN ) 25 MG tablet Take 25 mg by mouth every 6 (six) hours as needed for nausea or vomiting.     Rimegepant Sulfate (NURTEC) 75 MG TBDP Take 75 mg by mouth daily as needed (migraines).     rosuvastatin  (CRESTOR ) 20 MG tablet Take 1 tablet (20 mg total) by mouth daily. (Patient taking differently: Take 20 mg by mouth at  bedtime.) 30 tablet 6   terconazole (TERAZOL 7) 0.4 % vaginal cream Place 1 applicator vaginally at bedtime as needed (yeast infections).     trospium  (SANCTURA ) 20 MG tablet Take 1 tablet (20 mg total) by mouth 2 (two) times daily. 60 tablet 5   valACYclovir  (VALTREX ) 1000 MG tablet Take 1 tablet (1,000 mg total) by mouth  3 (three) times daily. 30 tablet 0   VITAMIN A PO Take 1 tablet by mouth daily.     No current facility-administered medications on file prior to visit.    Past Surgical History:  Procedure Laterality Date   CHOLECYSTECTOMY, LAPAROSCOPIC  05/13/2010   @[facility]  by dr m. wakefield   COLECTOMY  1975   age 65 months old (bowel obstruction)   COLPOSCOPY N/A 08/29/2023   Procedure: COLPOSCOPY;  Surgeon: Latisha Medford, MD;  Location: [facility];  Service: Gynecology;  Laterality: N/A;   INGUINAL HERNIA REPAIR Left 1980   age 89   KNEE CLOSED REDUCTION Right 08/22/2021   Procedure: RIGHT  KNEE MANIPULATION UNDER ANESTHESIA;  Surgeon: Addie Cordella Hamilton, MD;  Location: [facility];  Service: Orthopedics;  Laterality: Right;   LAPAROSCOPIC ENDOMETRIOSIS FULGURATION  1998   LOOP RECORDER IMPLANT  02/05/2017   @[facility]  by dr f. al-khori;    (08-24-2023  per pt removed approx 2022)   TOTAL KNEE ARTHROPLASTY Right 07/05/2021   Procedure: TOTAL KNEE ARTHROPLASTY;  Surgeon: Addie Cordella Hamilton, MD;  Location: [facility] OR;  Service: Orthopedics;  Laterality: Right;   WISDOM TOOTH EXTRACTION  2007    Allergies  Allergen Reactions   Macrobid [Nitrofurantoin] Hives and Shortness Of Breath   Metformin  And Related Other (See Comments)    Abdominal cramping    Byetta 10 Mcg Pen [Exenatide] Hives   Clindamycin/Lincomycin Nausea And Vomiting   Geodon [Ziprasidone Hcl]     Body starts shutting down   Hydromorphone  Itching    Per pt severe   Lipitor [Atorvastatin ]     Per patient this causes cramps   Nsaids     Heartburn   Penicillins Other (See Comments)     Childhood reaction Has patient had a PCN reaction causing immediate rash, facial/tongue/throat swelling, SOB or lightheadedness with hypotension: No Has patient had a PCN reaction causing severe rash involving mucus membranes or skin necrosis: No Has patient had a PCN reaction that required hospitalization No Has patient had a PCN reaction occurring within the last 10 years: No If all of the above answers are NO, then may proceed with Cephalosporin use.    Trulicity [Dulaglutide]     Abdominal pain    Avocado Diarrhea and Other (See Comments)    Severe cramping, sweats, and diarrhea in upper GI/stomach   Tramadol Hcl Itching    BP 124/71   Ht 5' 1.75 (1.568 m)   Wt 185 lb (83.9 kg)   BMI 34.11 kg/m       No data to display              No data to display              Objective:  Physical Exam:  Gen: NAD, comfortable in exam room  Right thumb: Nodule at A1 pulley.  No other deformity, swelling, bruising. Difficulty flexing at IP joint, catching at level of A1 pulley. Tenderness to palpation at nodule above. NVI distally.   Left knee: No deformity. FROM with 5/5 strength. Tenderness to palpation medial joint line. NVI distally.  Assessment & Plan:  1. Right trigger thumb - discussed options.  Injection given today.  Shown how to splint IP joint as well.  After informed written consent timeout was performed.  Patient was seated on exam table.  Area overlying right 1st A1 pulley prepped with alcohol swabs then injected with 0.5:0.5mL lidocaine : depomedrol.  Patient tolerated procedure well without immediate  complications.  2. Left knee arthritis - has done well with visco in past - will look into this if it's an option for her.  Last time this was done was February.

## 2024-07-28 ENCOUNTER — Encounter: Payer: Self-pay | Admitting: Family Medicine

## 2024-07-28 ENCOUNTER — Other Ambulatory Visit: Payer: Self-pay | Admitting: Family Medicine

## 2024-07-29 ENCOUNTER — Other Ambulatory Visit: Payer: Self-pay

## 2024-07-29 MED ORDER — CYCLOBENZAPRINE HCL 10 MG PO TABS: 10.0000 mg | ORAL_TABLET | Freq: Three times a day (TID) | ORAL | 3 refills | Status: AC | PRN

## 2024-09-17 ENCOUNTER — Ambulatory Visit: Admitting: Family Medicine

## 2024-09-17 ENCOUNTER — Other Ambulatory Visit: Payer: Self-pay

## 2024-09-17 VITALS — BP 122/83 | Ht 61.5 in | Wt 199.0 lb

## 2024-09-17 DIAGNOSIS — M19041 Primary osteoarthritis, right hand: Secondary | ICD-10-CM | POA: Diagnosis not present

## 2024-09-17 DIAGNOSIS — M1712 Unilateral primary osteoarthritis, left knee: Secondary | ICD-10-CM

## 2024-09-17 DIAGNOSIS — M19042 Primary osteoarthritis, left hand: Secondary | ICD-10-CM | POA: Diagnosis not present

## 2024-09-17 MED ORDER — SODIUM HYALURONATE (VISCOSUP) 16.8 MG/2ML IX SOSY
16.8000 mg | PREFILLED_SYRINGE | Freq: Once | INTRA_ARTICULAR | Status: AC
  Administered 2024-09-17: 16.8 mg via INTRA_ARTICULAR

## 2024-09-17 NOTE — Progress Notes (Signed)
 PCP: Alray Loader, MD  Patient is a 48 y.o. female here for left knee arthritis, bilateral hand pain.  HPI Patient returns to start visco series for her left knee. Has done well with this in the past. Both hands are more painful as the weather has gotten colder. Feels this in joints of both hands. Numbness had been better with using wrist braces - feels some just in tips of 3rd, 4th digits. She is using flexeril  as needed, tylenol , topical voltaren  gel occasionally.  Past Medical History:  Diagnosis Date   Adopted person    per pt unknown biological medical history   Anticoagulant long-term use    plavix --- managed by  ?neurology or pcp  (stroke prevention)   Asthma    Carpal tunnel syndrome    Cervical dysplasia 07/2023   low grade   Diabetic gastroparesis (HCC)    Dyspnea    08-27-2023  pt stated able to do stairs with minimal sob, household chores w/ no sob   GAD (generalized anxiety disorder)    GERD (gastroesophageal reflux disease)    History of CVA (cerebrovascular accident) 03/25/2016   left MCA ---  without resiudals   History of CVA (cerebrovascular accident) 04/2016   neurologist--  Dr. Greig Pellant ([facility]);;  second cva treated with tpn,  without residials   History of HPV infection    History of small bowel obstruction    age 67 months s/p surgical intervention   Hyperlipidemia    Hypertension    Hypothyroidism    followed by endocrinologist   Insomnia    Insulin  dependent type 2 diabetes mellitus (HCC) 2004   endocrinology--- williams breeante PA;  has insulin  pump w/ novolog    Insulin  pump in place    Irritable bowel syndrome with diarrhea    MDD (major depressive disorder)    sees Dr. Wolm Netters in [place]    Migraine without aura and without status migrainosus, not intractable    se[facility] Greig Pellant at [facility]    Morbid obesity [facility])    Multiple personality disorder (HCC)    w/ periods of catatonia   NASH (nonalcoholic steatohepatitis)     followed by [facility]-- dr sheehid   Neuropathy associated with endocrine disorder    OA (osteoarthritis)    knees, shoulders, elbows   OSA (obstructive sleep apnea)    08-27-2023  per pt last study approx 4 yrs ago told moderate osa,  no longer uses cpap , not functional , has schduled to have study done 12/ 2024 does not think she has osa any more   PCOS (polycystic ovarian syndrome)    PVC's (premature ventricular contractions)    EP cardiologly-- dr meldon   Smokers' cough (HCC)    nonproductive   Wears contact lenses     Current Outpatient Medications on File Prior to Visit  Medication Sig Dispense Refill   acetaminophen  (TYLENOL ) 500 MG tablet Take 1,000 mg by mouth every 6 (six) hours as needed.     albuterol  (PROAIR  HFA) 108 (90 Base) MCG/ACT inhaler Inhale 2 puffs into the lungs every 4 (four) hours as needed for wheezing. 8.5 g 11   budesonide -formoterol  (SYMBICORT ) 160-4.5 MCG/ACT inhaler Inhale 2 puffs into the lungs 2 (two) times daily. 1 Inhaler 11   cetirizine (ZYRTEC) 10 MG tablet Take 50 mg by mouth in the morning, at noon, and at bedtime.     clopidogrel  (PLAVIX ) 75 MG tablet TAKE ONE TABLET BY MOUTH DAILY (Patient taking  differently: Take 75 mg by mouth daily.) 90 tablet 0   cyclobenzaprine  (FLEXERIL ) 10 MG tablet Take 1 tablet (10 mg total) by mouth 3 (three) times daily as needed for muscle spasms. 60 tablet 3   diclofenac  Sodium (VOLTAREN ) 1 % GEL Apply 1 application topically 2 (two) times daily as needed (pain).     diclofenac  Sodium (VOLTAREN ) 1 % GEL Apply 2 g topically 4 (four) times daily. 120 g 1   Galcanezumab-gnlm (EMGALITY) 120 MG/ML SOAJ Inject into the skin every 30 (thirty) days.     Glucagon (BAQSIMI ONE PACK) 3 MG/DOSE POWD Place 3 mg into the nose daily as needed (low blood sugar).     ibuprofen  (ADVIL ) 200 MG tablet Take 800 mg by mouth every 8 (eight) hours as needed.     insulin  aspart (NOVOLOG ) 100 UNIT/ML injection Use in the insulin  pump as  directed (Patient taking differently: as directed. Use in the insulin  pump as directed) 10 mL 0   Insulin  Glargine (BASAGLAR  KWIKPEN) 100 UNIT/ML Inject 30 Units into the skin as directed. Per pt when pump being used , as directed by provider do glargine daily     levothyroxine  (SYNTHROID , LEVOTHROID) 125 MCG tablet Take 1 tablet (125 mcg total) by mouth daily before breakfast. 90 tablet 3   linaclotide (LINZESS) 145 MCG CAPS capsule Take 145 mcg by mouth daily before breakfast.     Magnesium  500 MG CAPS Take 1 capsule by mouth daily.     Menthol , Topical Analgesic, (BIOFREEZE) 10 % LIQD Apply 1 application topically 2 (two) times daily as needed (pain).     metoprolol  succinate (TOPROL -XL) 25 MG 24 hr tablet Take 0.5 tablets (12.5 mg total) by mouth daily. 30 tablet 0   montelukast (SINGULAIR) 10 MG tablet Take 10 mg by mouth at bedtime.     Multiple Vitamins-Minerals (IMMUNE SUPPORT VITAMIN C PO) Take 1 capsule by mouth daily.     nystatin  (NYAMYC ) powder Apply topically QID. 60 g 0   ondansetron  (ZOFRAN -ODT) 4 MG disintegrating tablet Take 1 tablet (4 mg total) by mouth every 8 (eight) hours as needed. 12 tablet 1   pantoprazole  (PROTONIX ) 40 MG tablet TAKE ONE TABLET BY MOUTH TWICE A DAY (Patient taking differently: Take 40 mg by mouth 2 (two) times daily.) 30 tablet 1   Perfluorohexyloctane (MIEBO) 1.338 GM/ML SOLN Apply 1 drop to eye 2 (two) times daily.     prazosin  (MINIPRESS ) 5 MG capsule Take 5 mg by mouth at bedtime.     predniSONE  (DELTASONE ) 10 MG tablet 6 tabs po day 1, 5 tabs po day 2, 4 tabs po day 3, 3 tabs po day 4, 2 tabs po day 5, 1 tab po day 6 21 tablet 0   Prenatal Vit-Fe Fumarate-FA (MULTIVITAMIN-PRENATAL) 27-0.8 MG TABS tablet Take 1 tablet by mouth daily at 12 noon.     promethazine  (PHENERGAN ) 25 MG tablet Take 25 mg by mouth every 6 (six) hours as needed for nausea or vomiting.     Rimegepant Sulfate (NURTEC) 75 MG TBDP Take 75 mg by mouth daily as needed (migraines).      rosuvastatin  (CRESTOR ) 20 MG tablet Take 1 tablet (20 mg total) by mouth daily. (Patient taking differently: Take 20 mg by mouth at bedtime.) 30 tablet 6   terconazole (TERAZOL 7) 0.4 % vaginal cream Place 1 applicator vaginally at bedtime as needed (yeast infections).     trospium  (SANCTURA ) 20 MG tablet Take 1 tablet (20 mg total) by  mouth 2 (two) times daily. 60 tablet 5   valACYclovir  (VALTREX ) 1000 MG tablet Take 1 tablet (1,000 mg total) by mouth 3 (three) times daily. 30 tablet 0   VITAMIN A PO Take 1 tablet by mouth daily.     No current facility-administered medications on file prior to visit.    Past Surgical History:  Procedure Laterality Date   CHOLECYSTECTOMY, LAPAROSCOPIC  05/13/2010   @[facility]  by dr m. wakefield   COLECTOMY  1975   age 52 months old (bowel obstruction)   COLPOSCOPY N/A 08/29/2023   Procedure: COLPOSCOPY;  Surgeon: Latisha Medford, MD;  Location: [facility];  Service: Gynecology;  Laterality: N/A;   INGUINAL HERNIA REPAIR Left 1980   age 28   KNEE CLOSED REDUCTION Right 08/22/2021   Procedure: RIGHT  KNEE MANIPULATION UNDER ANESTHESIA;  Surgeon: Addie Cordella Hamilton, MD;  Location: [facility];  Service: Orthopedics;  Laterality: Right;   LAPAROSCOPIC ENDOMETRIOSIS FULGURATION  1998   LOOP RECORDER IMPLANT  02/05/2017   @[facility]  by dr f. al-khori;    (08-24-2023  per pt removed approx 2022)   TOTAL KNEE ARTHROPLASTY Right 07/05/2021   Procedure: TOTAL KNEE ARTHROPLASTY;  Surgeon: Addie Cordella Hamilton, MD;  Location: [facility] OR;  Service: Orthopedics;  Laterality: Right;   WISDOM TOOTH EXTRACTION  2007    Allergies  Allergen Reactions   Macrobid [Nitrofurantoin] Hives and Shortness Of Breath   Metformin  And Related Other (See Comments)    Abdominal cramping    Byetta 10 Mcg Pen [Exenatide] Hives   Clindamycin/Lincomycin Nausea And Vomiting   Geodon [Ziprasidone Hcl]     Body starts shutting down   Hydromorphone  Itching    Per pt  severe   Lipitor [Atorvastatin ]     Per patient this causes cramps   Nsaids     Heartburn   Penicillins Other (See Comments)    Childhood reaction Has patient had a PCN reaction causing immediate rash, facial/tongue/throat swelling, SOB or lightheadedness with hypotension: No Has patient had a PCN reaction causing severe rash involving mucus membranes or skin necrosis: No Has patient had a PCN reaction that required hospitalization No Has patient had a PCN reaction occurring within the last 10 years: No If all of the above answers are NO, then may proceed with Cephalosporin use.    Trulicity [Dulaglutide]     Abdominal pain    Avocado Diarrhea and Other (See Comments)    Severe cramping, sweats, and diarrhea in upper GI/stomach   Tramadol Hcl Itching    BP 122/83   Ht 5' 1.5 (1.562 m)   Wt 199 lb (90.3 kg)   BMI 36.99 kg/m       No data to display              No data to display              Objective:  Physical Exam:  Gen: NAD, comfortable in exam room  Bilateral hands: Mild swelling of PIP and DIP joints of 2nd-5th digits. FROM with 5/5 strength. No tenderness to palpation currently but feels pain typically between PIP and DIP joints. NVI distally.  Assessment and Plan:  Left knee osteroarthritis - gelsyn series started today.  Follow up in 1 week for second of three injections.  After informed written consent timeout was performed, patient was lying supine on exam table. Left knee was prepped with alcohol swab and utilizing superolateral approach with ultrasound guidance, patient's left knee was injected  intraarticularly with 3mL lidocaine  followed by gelsyn. Patient tolerated the procedure well without immediate complications.  2. Bilateral hand pain - due to osteoarthritis.  Discussed tylenol , topical medications, supplements, nsaids, heat.

## 2024-09-19 ENCOUNTER — Telehealth: Admitting: Physician Assistant

## 2024-09-19 DIAGNOSIS — M79641 Pain in right hand: Secondary | ICD-10-CM

## 2024-09-19 DIAGNOSIS — M79642 Pain in left hand: Secondary | ICD-10-CM

## 2024-09-19 MED ORDER — PREDNISONE 10 MG (21) PO TBPK: ORAL_TABLET | ORAL | 0 refills | Status: AC

## 2024-09-19 NOTE — Patient Instructions (Signed)
 Miranda [name], thank you for joining Miranda CHRISTELLA Dickinson, PA-C for today's virtual visit.  While this provider is not your primary care provider (PCP), if your PCP is located in our provider database this encounter information will be shared with them immediately following your visit.   A [facility] MyChart account gives you access to today's visit and all your visits, tests, and labs performed at [facility]  click here if you don't have a Miranda [name] MyChart account or go to [email or web address]  Consent: (Patient) Miranda [name] provided verbal consent for this virtual visit at the beginning of the encounter.  Current Medications:  Current Outpatient Medications:    predniSONE  (STERAPRED UNI-PAK 21 TAB) 10 MG (21) TBPK tablet, 6 day taper; take as directed on package instructions, Disp: 21 tablet, Rfl: 0   acetaminophen  (TYLENOL ) 500 MG tablet, Take 1,000 mg by mouth every 6 (six) hours as needed., Disp: , Rfl:    albuterol  (PROAIR  HFA) 108 (90 Base) MCG/ACT inhaler, Inhale 2 puffs into the lungs every 4 (four) hours as needed for wheezing., Disp: 8.5 g, Rfl: 11   budesonide -formoterol  (SYMBICORT ) 160-4.5 MCG/ACT inhaler, Inhale 2 puffs into the lungs 2 (two) times daily., Disp: 1 Inhaler, Rfl: 11   cetirizine (ZYRTEC) 10 MG tablet, Take 50 mg by mouth in the morning, at noon, and at bedtime., Disp: , Rfl:    clopidogrel  (PLAVIX ) 75 MG tablet, TAKE ONE TABLET BY MOUTH DAILY (Patient taking differently: Take 75 mg by mouth daily.), Disp: 90 tablet, Rfl: 0   cyclobenzaprine  (FLEXERIL ) 10 MG tablet, Take 1 tablet (10 mg total) by mouth 3 (three) times daily as needed for muscle spasms., Disp: 60 tablet, Rfl: 3   diclofenac  Sodium (VOLTAREN ) 1 % GEL, Apply 1 application topically 2 (two) times daily as needed (pain)., Disp: , Rfl:    diclofenac  Sodium (VOLTAREN ) 1 % GEL, Apply 2 g topically 4 (four) times daily., Disp: 120 g, Rfl: 1   Galcanezumab-gnlm (EMGALITY) 120  MG/ML SOAJ, Inject into the skin every 30 (thirty) days., Disp: , Rfl:    Glucagon (BAQSIMI ONE PACK) 3 MG/DOSE POWD, Place 3 mg into the nose daily as needed (low blood sugar)., Disp: , Rfl:    ibuprofen  (ADVIL ) 200 MG tablet, Take 800 mg by mouth every 8 (eight) hours as needed., Disp: , Rfl:    insulin  aspart (NOVOLOG ) 100 UNIT/ML injection, Use in the insulin  pump as directed (Patient taking differently: as directed. Use in the insulin  pump as directed), Disp: 10 mL, Rfl: 0   Insulin  Glargine (BASAGLAR  KWIKPEN) 100 UNIT/ML, Inject 30 Units into the skin as directed. Per pt when pump being used , as directed by provider do glargine daily, Disp: , Rfl:    levothyroxine  (SYNTHROID , LEVOTHROID) 125 MCG tablet, Take 1 tablet (125 mcg total) by mouth daily before breakfast., Disp: 90 tablet, Rfl: 3   linaclotide (LINZESS) 145 MCG CAPS capsule, Take 145 mcg by mouth daily before breakfast., Disp: , Rfl:    Magnesium  500 MG CAPS, Take 1 capsule by mouth daily., Disp: , Rfl:    Menthol , Topical Analgesic, (BIOFREEZE) 10 % LIQD, Apply 1 application topically 2 (two) times daily as needed (pain)., Disp: , Rfl:    metoprolol  succinate (TOPROL -XL) 25 MG 24 hr tablet, Take 0.5 tablets (12.5 mg total) by mouth daily., Disp: 30 tablet, Rfl: 0   montelukast (SINGULAIR) 10 MG tablet, Take 10 mg by mouth at bedtime., Disp: , Rfl:    Multiple Vitamins-Minerals (  IMMUNE SUPPORT VITAMIN C PO), Take 1 capsule by mouth daily., Disp: , Rfl:    nystatin  (NYAMYC ) powder, Apply topically QID., Disp: 60 g, Rfl: 0   ondansetron  (ZOFRAN -ODT) 4 MG disintegrating tablet, Take 1 tablet (4 mg total) by mouth every 8 (eight) hours as needed., Disp: 12 tablet, Rfl: 1   pantoprazole  (PROTONIX ) 40 MG tablet, TAKE ONE TABLET BY MOUTH TWICE A DAY (Patient taking differently: Take 40 mg by mouth 2 (two) times daily.), Disp: 30 tablet, Rfl: 1   Perfluorohexyloctane (MIEBO) 1.338 GM/ML SOLN, Apply 1 drop to eye 2 (two) times daily., Disp: ,  Rfl:    prazosin  (MINIPRESS ) 5 MG capsule, Take 5 mg by mouth at bedtime., Disp: , Rfl:    Prenatal Vit-Fe Fumarate-FA (MULTIVITAMIN-PRENATAL) 27-0.8 MG TABS tablet, Take 1 tablet by mouth daily at 12 noon., Disp: , Rfl:    promethazine  (PHENERGAN ) 25 MG tablet, Take 25 mg by mouth every 6 (six) hours as needed for nausea or vomiting., Disp: , Rfl:    Rimegepant Sulfate (NURTEC) 75 MG TBDP, Take 75 mg by mouth daily as needed (migraines)., Disp: , Rfl:    rosuvastatin  (CRESTOR ) 20 MG tablet, Take 1 tablet (20 mg total) by mouth daily. (Patient taking differently: Take 20 mg by mouth at bedtime.), Disp: 30 tablet, Rfl: 6   terconazole (TERAZOL 7) 0.4 % vaginal cream, Place 1 applicator vaginally at bedtime as needed (yeast infections)., Disp: , Rfl:    trospium  (SANCTURA ) 20 MG tablet, Take 1 tablet (20 mg total) by mouth 2 (two) times daily., Disp: 60 tablet, Rfl: 5   valACYclovir  (VALTREX ) 1000 MG tablet, Take 1 tablet (1,000 mg total) by mouth 3 (three) times daily., Disp: 30 tablet, Rfl: 0   VITAMIN A PO, Take 1 tablet by mouth daily., Disp: , Rfl:    Medications ordered in this encounter:  Meds ordered this encounter  Medications   predniSONE  (STERAPRED UNI-PAK 21 TAB) 10 MG (21) TBPK tablet    Sig: 6 day taper; take as directed on package instructions    Dispense:  21 tablet    Refill:  0    Supervising Provider:   BLAISE ALEENE KIDD [[id number]]     *If you need refills on other medications prior to your next appointment, please contact your pharmacy*  Follow-Up: Call back or seek an in-person evaluation if the symptoms worsen or if the condition fails to improve as anticipated.  [facility] [phone]  Other Instructions Rheumatoid Arthritis Rheumatoid arthritis (RA) is a long-term (chronic) disease that causes inflammation in the joints. RA may start slowly. It most often affects the small joints of the hands and feet. Usually, the same joints are affected on both  sides of the body. Inflammation from RA can also affect other parts of the body, including the heart, eyes, or lungs. There is no cure for RA, but medicines can help your symptoms and stop or slow down the progression of the disease. What are the causes? RA is an autoimmune disease. When you have an autoimmune disease, your body's defense system (immune system) mistakenly attacks healthy body tissues. The exact cause of RA is not known. What increases the risk? The following factors may make you more likely to develop this condition: Being female. Having a family history of RA or other autoimmune diseases. Having a history of smoking. Being obese. Having been exposed to pollutants or chemicals. What are the signs or symptoms? Symptoms of this condition usually start  gradually. They are often worse in the morning. The first symptom may be morning stiffness that lasts longer than 30 minutes. As RA progresses, symptoms may include: Pain, stiffness, swelling, warmth, and tenderness in joints on both sides of your body. Loss of energy. Loss of appetite. Weight loss. Low-grade fever. Dry eyes and dry mouth. Firm lumps (rheumatoid nodules) that grow beneath your skin in areas such as your forearm bones near your elbows and on your hands. Changes in the appearance of joints (deformity) and loss of joint function. Symptoms of this condition vary from person to person. Symptoms of RA often come and go. Sometimes, symptoms get worse for a period of time. These are called flares. How is this diagnosed? This condition is diagnosed based on your symptoms, medical history, and a physical exam. You may have X-rays or an MRI to check for the type of joint changes that are caused by RA. You may also have blood tests to look for: Proteins (antibodies) that your immune system may make if you have RA. These include rheumatoid factor (RF) and anti-CCP. When blood tests show these proteins, you are said to  have seropositive RA. When blood tests do not show these proteins, you may have seronegative RA. Inflammation in your blood. A low number of red blood cells (anemia). How is this treated? The goals of treatment are to relieve pain, reduce inflammation, and slow down or stop joint damage and disability. Treatment may include: Lifestyle changes. It is important to rest as needed, eat a healthy diet, and exercise. Medicines. Your health care provider may adjust your medicines every 3 months until treatment goals are reached. Common medicines include: Pain relievers (analgesics). Corticosteroids and NSAIDs, such as ibuprofen , to reduce inflammation. Disease-modifying antirheumatic drugs (DMARDs) to try to slow the course of the disease. Biologic response modifiers to reduce inflammation and damage. Physical therapy and occupational therapy. Surgery, if you have severe joint damage. Joint replacement or fusing of joints may be needed. Your health care provider will work with you to identify the best treatment option for you based on assessment of the overall disease activity in your body. Follow these instructions at home: Managing pain, stiffness, and swelling If directed, apply heat to the affected area as often as told by your health care provider. Use the heat source that your health care provider recommends, such as a moist heat pack or a heating pad. Place a towel between your skin and the heat source. Leave the heat on for 20-30 minutes. Remove the heat if your skin turns bright red. This is especially important if you are unable to feel pain, heat, or cold. You have a greater risk of getting burned.  Activity Return to your normal activities as told by your health care provider. Ask your health care provider what activities are safe for you. Rest when you are having a flare. Start an exercise program as told by your health care provider. This may include physical therapy exercises to  maintain movement and strength in your joints. General instructions Take over-the-counter and prescription medicines only as told by your health care provider. Keep all follow-up visits. This is important. Where to find more information [employer] of [employer]umat[employer]: rheumatology.org Arthritis Foundation: arthritis.org Contact a health care provider if: You have a flare-up of RA symptoms. You have a fever. You have side effects from your medicines. Get help right away if: You have chest pain. You have trouble breathing. You quickly develop a hot, painful joint that  is more severe than your usual joint aches. These symptoms may be an emergency. Get help right away. Call 911. Do not wait to see if the symptoms will go away. Do not drive yourself to the hospital. Summary Rheumatoid arthritis (RA) is a long-term (chronic) disease that causes inflammation in the joints. RA is an autoimmune disease. The goals of treatment are to relieve pain, reduce inflammation, and slow down or stop joint damage and disability. This information is not intended to replace advice given to you by your health care provider. Make sure you discuss any questions you have with your health care provider. Document Revised: 08/18/2021 Document Reviewed: 08/18/2021 Elsevier Patient Education  2024 Elsevier Inc.   If you have been instructed to have an in-person evaluation today at a local Urgent Care facility, please use the link below. It will take you to a list of all of our available [facility], including address, phone number and hours of operation. Please do not delay care.  [facility]  If you or a family member do not have a primary care provider, use the link below to schedule a visit and establish care. When you choose a [facility] primary care physician or advanced practice provider, you gain a long-term partner in health. Find a Primary Care Provider  Learn more about [facility]'s in-office and virtual care options:  - Get Care Now

## 2024-09-19 NOTE — Progress Notes (Signed)
 Virtual Visit Consent   Miranda [name], you are scheduled for a virtual visit with a [facility] provider today. Just as with appointments in the office, your consent must be obtained to participate. Your consent will be active for this visit and any virtual visit you may have with one of our providers in the next 365 days. If you have a MyChart account, a copy of this consent can be sent to you electronically.  As this is a virtual visit, video technology does not allow for your provider to perform a traditional examination. This may limit your provider's ability to fully assess your condition. If your provider identifies any concerns that need to be evaluated in person or the need to arrange testing (such as labs, EKG, etc.), we will make arrangements to do so. Although advances in technology are sophisticated, we cannot ensure that it will always work on either your end or our end. If the connection with a video visit is poor, the visit may have to be switched to a telephone visit. With either a video or telephone visit, we are not always able to ensure that we have a secure connection.  By engaging in this virtual visit, you consent to the provision of healthcare and authorize for your insurance to be billed (if applicable) for the services provided during this visit. Depending on your insurance coverage, you may receive a charge related to this service.  I need to obtain your verbal consent now. Are you willing to proceed with your visit today? Miranda [name] has provided verbal consent on 09/19/2024 for a virtual visit (video or telephone). Miranda CHRISTELLA Dickinson, PA-C  Date: 09/19/2024 5:39 PM   Virtual Visit via Video Note   I, Miranda [name], connected with  Miranda [name]  ([id number], [birth year]) on 09/19/24 at  5:15 PM EST by a video-enabled telemedicine application and verified that I am speaking with the correct person using two identifiers.  Location: Patient: Virtual  Visit Location Patient: Home Provider: Virtual Visit Location Provider: Home Office   I discussed the limitations of evaluation and management by telemedicine and the availability of in person appointments. The patient expressed understanding and agreed to proceed.    History of Present Illness: Miranda [name] is a 49 y.o. who identifies as a female who was assigned female at birth, and is being seen today for worsening joint pains. Saw PCP on 09/17/24 where this was mentioned, but was also being seen for OA in her knees. Reports hands are becoming more swollen, stiff, and painful. Symptoms are present in fingers mostly, thumbs are better. Just started in the past week. Flexion worsens the pain.   Tylenol  arthritis, tylenol  gel, diclofenac  gel have been tried and none relieve symptoms at all.   Has had carpal tunnel bilaterally (L>R) and reports this feels completely different. Has no numbness or tingling type pain.   Has also had trigger finger and reports no locking of the joints.   Has been seeing her PCP for OA in her knees and started hyaluronic shots on 09/17/24.  T: 99.8   Problems:  Patient Active Problem List   Diagnosis Date Noted   Osteoarthritis of left knee 08/14/2022   Arthrofibrosis of knee joint, right    Arthritis of right knee    Diabetic gastroparesis (HCC) 07/20/2021   Constipation 07/20/2021   S/P total knee arthroplasty, right 07/05/2021   Chronic diarrhea 04/29/2021   Belching 04/29/2021   Primary osteoarthritis of both knees  10/28/2020   Irritable bowel syndrome with diarrhea 10/16/2019   Tendinitis of right rotator cuff 09/17/2019   Hand pain, right 05/21/2019   Labral tear of hip joint 05/21/2019   Type 2 diabetes mellitus with diabetic neuropathy, unspecified (HCC) 10/01/2018   Right hip pain 05/22/2018   Everitt Quervain's disease (radial styloid tenosynovitis) 04/02/2018   Carpal tunnel syndrome, right upper limb 12/31/2017   Elbow injury, right,  initial encounter 12/16/2017   Chronic pain of both shoulders 11/30/2017   PTSD (post-traumatic stress disorder) 10/25/2016   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   Diabetes mellitus (HCC) 10/25/2016   Major depressive disorder, recurrent episode, severe, with psychosis (HCC) 10/20/2016   Major depressive disorder, recurrent episode, severe, 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HCC) 10/19/2016   TIA (transient ischemic attack) 05/16/2016   Arterial ischemic stroke, MCA, left, acute (HCC) 03/29/2016   Plantar fasciitis 08/08/2013   Obesity, Class III, BMI 40-49.9 (morbid obesity) (HCC) 04/09/2013   Sleep apnea 03/31/2013   PCO (polycystic ovaries) 04/11/2012   Neck pain 11/21/2010   FATTY LIVER DISEASE 01/26/2010   NAUSEA WITH VOMITING 01/26/2010   PORTAL HYPERTENSION 01/26/2010   Migraine headache 06/28/2009   BREAST MASS 08/21/2008   KNEE PAIN 04/14/2008   Hypothyroidism 03/17/2008   Hyperlipidemia 03/17/2008   DEPRESSION 03/17/2008   Mononeuritis 03/17/2008   Essential hypertension 03/17/2008   Asthma 03/17/2008   GERD 03/17/2008   Headache 03/17/2008    Allergies:  Allergies  Allergen Reactions   Macrobid [Nitrofurantoin] Hives and Shortness Of Breath   Metformin  And Related Other (See Comments)    Abdominal cramping    Byetta 10 Mcg Pen [Exenatide] Hives   Clindamycin/Lincomycin Nausea And Vomiting   Geodon [Ziprasidone Hcl]     Body starts shutting down   Hydromorphone  Itching    Per pt severe   Lipitor [Atorvastatin ]     Per patient this causes cramps   Nsaids     Heartburn   Penicillins Other (See Comments)    Childhood reaction Has patient had a PCN reaction causing immediate rash, facial/tongue/throat swelling, SOB or lightheadedness with hypotension: No Has patient had a PCN reaction causing severe rash involving mucus membranes or skin necrosis: No Has patient had a PCN reaction that required hospitalization No Has patient had a PCN reaction occurring  within the last 10 years: No If all of the above answers are NO, then may proceed with Cephalosporin use.    Trulicity [Dulaglutide]     Abdominal pain    Avocado Diarrhea and Other (See Comments)    Severe cramping, sweats, and diarrhea in upper GI/stomach   Tramadol Hcl Itching   Medications:  Current Outpatient Medications:    predniSONE  (STERAPRED UNI-PAK 21 TAB) 10 MG (21) TBPK tablet, 6 day taper; take as directed on package instructions, Disp: 21 tablet, Rfl: 0   acetaminophen  (TYLENOL ) 500 MG tablet, Take 1,000 mg by mouth every 6 (six) hours as needed., Disp: , Rfl:    albuterol  (PROAIR  HFA) 108 (90 Base) MCG/ACT inhaler, Inhale 2 puffs into the lungs every 4 (four) hours as needed for wheezing., Disp: 8.5 g, Rfl: 11   budesonide -formoterol  (SYMBICORT ) 160-4.5 MCG/ACT inhaler, Inhale 2 puffs into the lungs 2 (two) times daily., Disp: 1 Inhaler, Rfl: 11   cetirizine (ZYRTEC) 10 MG tablet, Take 50 mg by mouth in the morning, at noon, and at bedtime., Disp: , Rfl:    clopidogrel  (PLAVIX ) 75 MG tablet, TAKE ONE TABLET BY MOUTH DAILY (Patient taking differently: Take 75  mg by mouth daily.), Disp: 90 tablet, Rfl: 0   cyclobenzaprine  (FLEXERIL ) 10 MG tablet, Take 1 tablet (10 mg total) by mouth 3 (three) times daily as needed for muscle spasms., Disp: 60 tablet, Rfl: 3   diclofenac  Sodium (VOLTAREN ) 1 % GEL, Apply 1 application topically 2 (two) times daily as needed (pain)., Disp: , Rfl:    diclofenac  Sodium (VOLTAREN ) 1 % GEL, Apply 2 g topically 4 (four) times daily., Disp: 120 g, Rfl: 1   Galcanezumab-gnlm (EMGALITY) 120 MG/ML SOAJ, Inject into the skin every 30 (thirty) days., Disp: , Rfl:    Glucagon (BAQSIMI ONE PACK) 3 MG/DOSE POWD, Place 3 mg into the nose daily as needed (low blood sugar)., Disp: , Rfl:    ibuprofen  (ADVIL ) 200 MG tablet, Take 800 mg by mouth every 8 (eight) hours as needed., Disp: , Rfl:    insulin  aspart (NOVOLOG ) 100 UNIT/ML injection, Use in the insulin  pump  as directed (Patient taking differently: as directed. Use in the insulin  pump as directed), Disp: 10 mL, Rfl: 0   Insulin  Glargine (BASAGLAR  KWIKPEN) 100 UNIT/ML, Inject 30 Units into the skin as directed. Per pt when pump being used , as directed by provider do glargine daily, Disp: , Rfl:    levothyroxine  (SYNTHROID , LEVOTHROID) 125 MCG tablet, Take 1 tablet (125 mcg total) by mouth daily before breakfast., Disp: 90 tablet, Rfl: 3   linaclotide (LINZESS) 145 MCG CAPS capsule, Take 145 mcg by mouth daily before breakfast., Disp: , Rfl:    Magnesium  500 MG CAPS, Take 1 capsule by mouth daily., Disp: , Rfl:    Menthol , Topical Analgesic, (BIOFREEZE) 10 % LIQD, Apply 1 application topically 2 (two) times daily as needed (pain)., Disp: , Rfl:    metoprolol  succinate (TOPROL -XL) 25 MG 24 hr tablet, Take 0.5 tablets (12.5 mg total) by mouth daily., Disp: 30 tablet, Rfl: 0   montelukast (SINGULAIR) 10 MG tablet, Take 10 mg by mouth at bedtime., Disp: , Rfl:    Multiple Vitamins-Minerals (IMMUNE SUPPORT VITAMIN C PO), Take 1 capsule by mouth daily., Disp: , Rfl:    nystatin  (NYAMYC ) powder, Apply topically QID., Disp: 60 g, Rfl: 0   ondansetron  (ZOFRAN -ODT) 4 MG disintegrating tablet, Take 1 tablet (4 mg total) by mouth every 8 (eight) hours as needed., Disp: 12 tablet, Rfl: 1   pantoprazole  (PROTONIX ) 40 MG tablet, TAKE ONE TABLET BY MOUTH TWICE A DAY (Patient taking differently: Take 40 mg by mouth 2 (two) times daily.), Disp: 30 tablet, Rfl: 1   Perfluorohexyloctane (MIEBO) 1.338 GM/ML SOLN, Apply 1 drop to eye 2 (two) times daily., Disp: , Rfl:    prazosin  (MINIPRESS ) 5 MG capsule, Take 5 mg by mouth at bedtime., Disp: , Rfl:    Prenatal Vit-Fe Fumarate-FA (MULTIVITAMIN-PRENATAL) 27-0.8 MG TABS tablet, Take 1 tablet by mouth daily at 12 noon., Disp: , Rfl:    promethazine  (PHENERGAN ) 25 MG tablet, Take 25 mg by mouth every 6 (six) hours as needed for nausea or vomiting., Disp: , Rfl:    Rimegepant  Sulfate (NURTEC) 75 MG TBDP, Take 75 mg by mouth daily as needed (migraines)., Disp: , Rfl:    rosuvastatin  (CRESTOR ) 20 MG tablet, Take 1 tablet (20 mg total) by mouth daily. (Patient taking differently: Take 20 mg by mouth at bedtime.), Disp: 30 tablet, Rfl: 6   terconazole (TERAZOL 7) 0.4 % vaginal cream, Place 1 applicator vaginally at bedtime as needed (yeast infections)., Disp: , Rfl:    trospium  (SANCTURA ) 20  MG tablet, Take 1 tablet (20 mg total) by mouth 2 (two) times daily., Disp: 60 tablet, Rfl: 5   valACYclovir  (VALTREX ) 1000 MG tablet, Take 1 tablet (1,000 mg total) by mouth 3 (three) times daily., Disp: 30 tablet, Rfl: 0   VITAMIN A PO, Take 1 tablet by mouth daily., Disp: , Rfl:   Observations/Objective: Patient is well-developed, well-nourished in no acute distress.  Resting comfortably at home.  Head is normocephalic, atraumatic.  No labored breathing.  Speech is clear and coherent with logical content.  Patient is alert and oriented at baseline.    Assessment and Plan: 1. Pain in both hands (Primary) - predniSONE  (STERAPRED UNI-PAK 21 TAB) 10 MG (21) TBPK tablet; 6 day taper; take as directed on package instructions  Dispense: 21 tablet; Refill: 0  - Bilateral hand pain, swelling, and stiffness presenting acutely and worsening over the last week. - Suspicious for possible autoimmune course - Prednisone  provided - Follow up with PCP as scheduled on Monday to discuss possible work up if needed  Follow Up Instructions: I discussed the assessment and treatment plan with the patient. The patient was provided an opportunity to ask questions and all were answered. The patient agreed with the plan and demonstrated an understanding of the instructions.  A copy of instructions were sent to the patient via MyChart unless otherwise noted below.    The patient was advised to call back or seek an in-person evaluation if the symptoms worsen or if the condition fails to improve as  anticipated.    Miranda CHRISTELLA Dickinson, PA-C

## 2024-09-22 ENCOUNTER — Ambulatory Visit: Admitting: Family Medicine

## 2024-09-22 ENCOUNTER — Other Ambulatory Visit: Payer: Self-pay

## 2024-09-22 ENCOUNTER — Encounter: Payer: Self-pay | Admitting: Family Medicine

## 2024-09-22 VITALS — BP 130/82 | Ht 61.5 in | Wt 199.0 lb

## 2024-09-22 DIAGNOSIS — M1712 Unilateral primary osteoarthritis, left knee: Secondary | ICD-10-CM

## 2024-09-22 MED ORDER — SODIUM HYALURONATE (VISCOSUP) 16.8 MG/2ML IX SOSY
16.8000 mg | PREFILLED_SYRINGE | Freq: Once | INTRA_ARTICULAR | Status: AC
  Administered 2024-09-22: 16.8 mg via INTRA_ARTICULAR

## 2024-09-22 NOTE — Progress Notes (Signed)
 PCP: Alray Loader, MD  Patient is a 48 y.o. female here for left knee arthritis.  HPI Patient returns for her second visco injection for left knee.  Past Medical History:  Diagnosis Date   Adopted person    per pt unknown biological medical history   Anticoagulant long-term use    plavix --- managed by  ?neurology or pcp  (stroke prevention)   Asthma    Carpal tunnel syndrome    Cervical dysplasia 07/2023   low grade   Diabetic gastroparesis (HCC)    Dyspnea    08-27-2023  pt stated able to do stairs with minimal sob, household chores w/ no sob   GAD (generalized anxiety disorder)    GERD (gastroesophageal reflux disease)    History of CVA (cerebrovascular accident) 03/25/2016   left MCA ---  without resiudals   History of CVA (cerebrovascular accident) 04/2016   neurologist--  Dr. Greig Pellant ([facility]);;  second cva treated with tpn,  without residials   History of HPV infection    History of small bowel obstruction    age 79 months s/p surgical intervention   Hyperlipidemia    Hypertension    Hypothyroidism    followed by endocrinologist   Insomnia    Insulin  dependent type 2 diabetes mellitus (HCC) 2004   endocrinology--- williams breeante PA;  has insulin  pump w/ novolog    Insulin  pump in place    Irritable bowel syndrome with diarrhea    MDD (major depressive disorder)    sees Dr. Wolm Netters in [place]    Migraine without aura and without status migrainosus, not intractable    sees Dr. Greig Pellant at [facility]    Morbid obesity [facility])    Multiple personality disorder (HCC)    w/ periods of catatonia   NASH (nonalcoholic steatohepatitis)    followed by [facility]-- dr sheehid   Neuropathy associated with endocrine disorder    OA (osteoarthritis)    knees, shoulders, elbows   OSA (obstructive sleep apnea)    08-27-2023  per pt last study approx 4 yrs ago told moderate osa,  no longer uses cpap , not functional , has schduled to have study done 12/ 2024 does  not think she has osa any more   PCOS (polycystic ovarian syndrome)    PVC's (premature ventricular contractions)    EP cardiologly-- dr meldon   Smokers' cough (HCC)    nonproductive   Wears contact lenses     Current Outpatient Medications on File Prior to Visit  Medication Sig Dispense Refill   acetaminophen  (TYLENOL ) 500 MG tablet Take 1,000 mg by mouth every 6 (six) hours as needed.     albuterol  (PROAIR  HFA) 108 (90 Base) MCG/ACT inhaler Inhale 2 puffs into the lungs every 4 (four) hours as needed for wheezing. 8.5 g 11   budesonide -formoterol  (SYMBICORT ) 160-4.5 MCG/ACT inhaler Inhale 2 puffs into the lungs 2 (two) times daily. 1 Inhaler 11   cetirizine (ZYRTEC) 10 MG tablet Take 50 mg by mouth in the morning, at noon, and at bedtime.     clopidogrel  (PLAVIX ) 75 MG tablet TAKE ONE TABLET BY MOUTH DAILY (Patient taking differently: Take 75 mg by mouth daily.) 90 tablet 0   cyclobenzaprine  (FLEXERIL ) 10 MG tablet Take 1 tablet (10 mg total) by mouth 3 (three) times daily as needed for muscle spasms. 60 tablet 3   diclofenac  Sodium (VOLTAREN ) 1 % GEL Apply 1 application topically 2 (two) times daily as needed (pain).  diclofenac  Sodium (VOLTAREN ) 1 % GEL Apply 2 g topically 4 (four) times daily. 120 g 1   Galcanezumab-gnlm (EMGALITY) 120 MG/ML SOAJ Inject into the skin every 30 (thirty) days.     Glucagon (BAQSIMI ONE PACK) 3 MG/DOSE POWD Place 3 mg into the nose daily as needed (low blood sugar).     ibuprofen  (ADVIL ) 200 MG tablet Take 800 mg by mouth every 8 (eight) hours as needed.     insulin  aspart (NOVOLOG ) 100 UNIT/ML injection Use in the insulin  pump as directed (Patient taking differently: as directed. Use in the insulin  pump as directed) 10 mL 0   Insulin  Glargine (BASAGLAR  KWIKPEN) 100 UNIT/ML Inject 30 Units into the skin as directed. Per pt when pump being used , as directed by provider do glargine daily     levothyroxine  (SYNTHROID , LEVOTHROID) 125 MCG tablet Take 1  tablet (125 mcg total) by mouth daily before breakfast. 90 tablet 3   linaclotide (LINZESS) 145 MCG CAPS capsule Take 145 mcg by mouth daily before breakfast.     Magnesium  500 MG CAPS Take 1 capsule by mouth daily.     Menthol , Topical Analgesic, (BIOFREEZE) 10 % LIQD Apply 1 application topically 2 (two) times daily as needed (pain).     metoprolol  succinate (TOPROL -XL) 25 MG 24 hr tablet Take 0.5 tablets (12.5 mg total) by mouth daily. 30 tablet 0   montelukast (SINGULAIR) 10 MG tablet Take 10 mg by mouth at bedtime.     Multiple Vitamins-Minerals (IMMUNE SUPPORT VITAMIN C PO) Take 1 capsule by mouth daily.     nystatin  (NYAMYC ) powder Apply topically QID. 60 g 0   ondansetron  (ZOFRAN -ODT) 4 MG disintegrating tablet Take 1 tablet (4 mg total) by mouth every 8 (eight) hours as needed. 12 tablet 1   pantoprazole  (PROTONIX ) 40 MG tablet TAKE ONE TABLET BY MOUTH TWICE A DAY (Patient taking differently: Take 40 mg by mouth 2 (two) times daily.) 30 tablet 1   Perfluorohexyloctane (MIEBO) 1.338 GM/ML SOLN Apply 1 drop to eye 2 (two) times daily.     prazosin  (MINIPRESS ) 5 MG capsule Take 5 mg by mouth at bedtime.     predniSONE  (STERAPRED UNI-PAK 21 TAB) 10 MG (21) TBPK tablet 6 day taper; take as directed on package instructions 21 tablet 0   Prenatal Vit-Fe Fumarate-FA (MULTIVITAMIN-PRENATAL) 27-0.8 MG TABS tablet Take 1 tablet by mouth daily at 12 noon.     promethazine  (PHENERGAN ) 25 MG tablet Take 25 mg by mouth every 6 (six) hours as needed for nausea or vomiting.     Rimegepant Sulfate (NURTEC) 75 MG TBDP Take 75 mg by mouth daily as needed (migraines).     rosuvastatin  (CRESTOR ) 20 MG tablet Take 1 tablet (20 mg total) by mouth daily. (Patient taking differently: Take 20 mg by mouth at bedtime.) 30 tablet 6   terconazole (TERAZOL 7) 0.4 % vaginal cream Place 1 applicator vaginally at bedtime as needed (yeast infections).     trospium  (SANCTURA ) 20 MG tablet Take 1 tablet (20 mg total) by mouth  2 (two) times daily. 60 tablet 5   valACYclovir  (VALTREX ) 1000 MG tablet Take 1 tablet (1,000 mg total) by mouth 3 (three) times daily. 30 tablet 0   VITAMIN A PO Take 1 tablet by mouth daily.     No current facility-administered medications on file prior to visit.    Past Surgical History:  Procedure Laterality Date   CHOLECYSTECTOMY, LAPAROSCOPIC  05/13/2010   @[facility]  by dr m.  wakefield   COLECTOMY  12   age 72 months old (bowel obstruction)   COLPOSCOPY N/A 08/29/2023   Procedure: COLPOSCOPY;  Surgeon: Latisha Medford, MD;  Location: [facility] [place];  Service: Gynecology;  Laterality: N/A;   INGUINAL HERNIA REPAIR Left 1980   age 59   KNEE CLOSED REDUCTION Right 08/22/2021   Procedure: RIGHT  KNEE MANIPULATION UNDER ANESTHESIA;  Surgeon: Addie Cordella Hamilton, MD;  Location: [facility];  Service: Orthopedics;  Laterality: Right;   LAPAROSCOPIC ENDOMETRIOSIS FULGURATION  1998   LOOP RECORDER IMPLANT  02/05/2017   @[facility]  by dr f. al-khori;    (08-24-2023  per pt removed approx 2022)   TOTAL KNEE ARTHROPLASTY Right 07/05/2021   Procedure: TOTAL KNEE ARTHROPLASTY;  Surgeon: Addie Cordella Hamilton, MD;  Location: [facility] OR;  Service: Orthopedics;  Laterality: Right;   WISDOM TOOTH EXTRACTION  2007    Allergies  Allergen Reactions   Macrobid [Nitrofurantoin] Hives and Shortness Of Breath   Metformin  And Related Other (See Comments)    Abdominal cramping    Byetta 10 Mcg Pen [Exenatide] Hives   Clindamycin/Lincomycin Nausea And Vomiting   Geodon [Ziprasidone Hcl]     Body starts shutting down   Hydromorphone  Itching    Per pt severe   Lipitor [Atorvastatin ]     Per patient this causes cramps   Nsaids     Heartburn   Penicillins Other (See Comments)    Childhood reaction Has patient had a PCN reaction causing immediate rash, facial/tongue/throat swelling, SOB or lightheadedness with hypotension: No Has patient had a PCN reaction causing severe rash involving  mucus membranes or skin necrosis: No Has patient had a PCN reaction that required hospitalization No Has patient had a PCN reaction occurring within the last 10 years: No If all of the above answers are NO, then may proceed with Cephalosporin use.    Trulicity [Dulaglutide]     Abdominal pain    Avocado Diarrhea and Other (See Comments)    Severe cramping, sweats, and diarrhea in upper GI/stomach   Tramadol Hcl Itching    BP 130/82   Ht 5' 1.5 (1.562 m)   Wt 199 lb (90.3 kg)   BMI 36.99 kg/m       No data to display              No data to display              Objective:  Physical Exam:  Gen: NAD, comfortable in exam room  Left knee exam not repeated today.  Assessment and Plan:  Left knee arthritis - second gelsyn injection given today.  Follow up in 1 week for third injection.  After informed written consent timeout was performed, patient was lying supine on exam table. Left knee was prepped with alcohol swab and utilizing superolateral approach with ultrasound guidance, patient's left knee was injected intraarticularly with 3mL lidocaine  followed by gelsyn. Patient tolerated the procedure well without immediate complications.

## 2024-09-29 ENCOUNTER — Other Ambulatory Visit: Payer: Self-pay

## 2024-09-29 ENCOUNTER — Ambulatory Visit (INDEPENDENT_AMBULATORY_CARE_PROVIDER_SITE_OTHER): Admitting: Family Medicine

## 2024-09-29 VITALS — BP 127/67 | Ht 61.5 in | Wt 199.0 lb

## 2024-09-29 DIAGNOSIS — M2550 Pain in unspecified joint: Secondary | ICD-10-CM

## 2024-09-29 DIAGNOSIS — M1712 Unilateral primary osteoarthritis, left knee: Secondary | ICD-10-CM

## 2024-09-29 MED ORDER — SODIUM HYALURONATE (VISCOSUP) 16.8 MG/2ML IX SOSY
16.8000 mg | PREFILLED_SYRINGE | Freq: Once | INTRA_ARTICULAR | Status: AC
  Administered 2024-09-29: 16.8 mg via INTRA_ARTICULAR

## 2024-09-29 NOTE — Progress Notes (Signed)
 PCP: Alray Loader, MD  Patient is a 48 y.o. female here for left knee arthritis, bilateral hand pain.  HPI Patient reports not much change in her pain so far with 2 visco injections. Here for third. She was on prednisone  for bilateral hand pain localized to DIP, PIP joints primarily and this helped her symptoms tremendously but since discontinuing pain has recurred. Not noticed redness but has swelling of these joints. Worse in cold weather. Has previously had trigger fingers, carpal tunnel though current symptoms different. Has tried tylenol , voltaren  gel as well as the prednisone .  Past Medical History:  Diagnosis Date   Adopted person    per pt unknown biological medical history   Anticoagulant long-term use    plavix --- managed by  ?neurology or pcp  (stroke prevention)   Asthma    Carpal tunnel syndrome    Cervical dysplasia 07/2023   low grade   Diabetic gastroparesis (HCC)    Dyspnea    08-27-2023  pt stated able to do stairs with minimal sob, household chores w/ no sob   GAD (generalized anxiety disorder)    GERD (gastroesophageal reflux disease)    History of CVA (cerebrovascular accident) 03/25/2016   left MCA ---  without resiudals   History of CVA (cerebrovascular accident) 04/2016   neurologist--  Dr. Greig Pellant ([facility]);;  second cva treated with tpn,  without residials   History of HPV infection    History of small bowel obstruction    age 23 months s/p surgical intervention   Hyperlipidemia    Hypertension    Hypothyroidism    followed by endocrinologist   Insomnia    Insulin  dependent type 2 diabetes mellitus (HCC) 2004   endocrinology--- williams breeante PA;  has insulin  pump w/ novolog    Insulin  pump in place    Irritable bowel syndrome with diarrhea    MDD (major depressive disorder)    sees Dr. Wolm Netters in [place]    Migraine without aura and without status migrainosus, not intractable    sees Dr. Greig Pellant at [facility]    Morbid  obesity [facility])    Multiple personality disorder (HCC)    w/ periods of catatonia   NASH (nonalcoholic steatohepatitis)    followed by [facility]-- dr sheehid   Neuropathy associated with endocrine disorder    OA (osteoarthritis)    knees, shoulders, elbows   OSA (obstructive sleep apnea)    08-27-2023  per pt last study approx 4 yrs ago told moderate osa,  no longer uses cpap , not functional , has schduled to have study done 12/ 2024 does not think she has osa any more   PCOS (polycystic ovarian syndrome)    PVC's (premature ventricular contractions)    EP cardiologly-- dr meldon   Smokers' cough (HCC)    nonproductive   Wears contact lenses     Current Outpatient Medications on File Prior to Visit  Medication Sig Dispense Refill   acetaminophen  (TYLENOL ) 500 MG tablet Take 1,000 mg by mouth every 6 (six) hours as needed.     albuterol  (PROAIR  HFA) 108 (90 Base) MCG/ACT inhaler Inhale 2 puffs into the lungs every 4 (four) hours as needed for wheezing. 8.5 g 11   budesonide -formoterol  (SYMBICORT ) 160-4.5 MCG/ACT inhaler Inhale 2 puffs into the lungs 2 (two) times daily. 1 Inhaler 11   cetirizine (ZYRTEC) 10 MG tablet Take 50 mg by mouth in the morning, at noon, and at bedtime.     clopidogrel  (  PLAVIX ) 75 MG tablet TAKE ONE TABLET BY MOUTH DAILY (Patient taking differently: Take 75 mg by mouth daily.) 90 tablet 0   cyclobenzaprine  (FLEXERIL ) 10 MG tablet Take 1 tablet (10 mg total) by mouth 3 (three) times daily as needed for muscle spasms. 60 tablet 3   diclofenac  Sodium (VOLTAREN ) 1 % GEL Apply 1 application topically 2 (two) times daily as needed (pain).     diclofenac  Sodium (VOLTAREN ) 1 % GEL Apply 2 g topically 4 (four) times daily. 120 g 1   Galcanezumab-gnlm (EMGALITY) 120 MG/ML SOAJ Inject into the skin every 30 (thirty) days.     Glucagon (BAQSIMI ONE PACK) 3 MG/DOSE POWD Place 3 mg into the nose daily as needed (low blood sugar).     ibuprofen  (ADVIL ) 200 MG tablet Take 800 mg  by mouth every 8 (eight) hours as needed.     insulin  aspart (NOVOLOG ) 100 UNIT/ML injection Use in the insulin  pump as directed (Patient taking differently: as directed. Use in the insulin  pump as directed) 10 mL 0   Insulin  Glargine (BASAGLAR  KWIKPEN) 100 UNIT/ML Inject 30 Units into the skin as directed. Per pt when pump being used , as directed by provider do glargine daily     levothyroxine  (SYNTHROID , LEVOTHROID) 125 MCG tablet Take 1 tablet (125 mcg total) by mouth daily before breakfast. 90 tablet 3   linaclotide (LINZESS) 145 MCG CAPS capsule Take 145 mcg by mouth daily before breakfast.     Magnesium  500 MG CAPS Take 1 capsule by mouth daily.     Menthol , Topical Analgesic, (BIOFREEZE) 10 % LIQD Apply 1 application topically 2 (two) times daily as needed (pain).     metoprolol  succinate (TOPROL -XL) 25 MG 24 hr tablet Take 0.5 tablets (12.5 mg total) by mouth daily. 30 tablet 0   montelukast (SINGULAIR) 10 MG tablet Take 10 mg by mouth at bedtime.     Multiple Vitamins-Minerals (IMMUNE SUPPORT VITAMIN C PO) Take 1 capsule by mouth daily.     nystatin  (NYAMYC ) powder Apply topically QID. 60 g 0   ondansetron  (ZOFRAN -ODT) 4 MG disintegrating tablet Take 1 tablet (4 mg total) by mouth every 8 (eight) hours as needed. 12 tablet 1   pantoprazole  (PROTONIX ) 40 MG tablet TAKE ONE TABLET BY MOUTH TWICE A DAY (Patient taking differently: Take 40 mg by mouth 2 (two) times daily.) 30 tablet 1   Perfluorohexyloctane (MIEBO) 1.338 GM/ML SOLN Apply 1 drop to eye 2 (two) times daily.     prazosin  (MINIPRESS ) 5 MG capsule Take 5 mg by mouth at bedtime.     predniSONE  (STERAPRED UNI-PAK 21 TAB) 10 MG (21) TBPK tablet 6 day taper; take as directed on package instructions 21 tablet 0   Prenatal Vit-Fe Fumarate-FA (MULTIVITAMIN-PRENATAL) 27-0.8 MG TABS tablet Take 1 tablet by mouth daily at 12 noon.     promethazine  (PHENERGAN ) 25 MG tablet Take 25 mg by mouth every 6 (six) hours as needed for nausea or  vomiting.     Rimegepant Sulfate (NURTEC) 75 MG TBDP Take 75 mg by mouth daily as needed (migraines).     rosuvastatin  (CRESTOR ) 20 MG tablet Take 1 tablet (20 mg total) by mouth daily. (Patient taking differently: Take 20 mg by mouth at bedtime.) 30 tablet 6   terconazole (TERAZOL 7) 0.4 % vaginal cream Place 1 applicator vaginally at bedtime as needed (yeast infections).     trospium  (SANCTURA ) 20 MG tablet Take 1 tablet (20 mg total) by mouth 2 (two) times  daily. 60 tablet 5   valACYclovir  (VALTREX ) 1000 MG tablet Take 1 tablet (1,000 mg total) by mouth 3 (three) times daily. 30 tablet 0   VITAMIN A PO Take 1 tablet by mouth daily.     No current facility-administered medications on file prior to visit.    Past Surgical History:  Procedure Laterality Date   CHOLECYSTECTOMY, LAPAROSCOPIC  05/13/2010   @[facility]  by dr m. wakefield   COLECTOMY  1975   age 68 months old (bowel obstruction)   COLPOSCOPY N/A 08/29/2023   Procedure: COLPOSCOPY;  Surgeon: Latisha Medford, MD;  Location: [facility] [place];  Service: Gynecology;  Laterality: N/A;   INGUINAL HERNIA REPAIR Left 1980   age 1   KNEE CLOSED REDUCTION Right 08/22/2021   Procedure: RIGHT  KNEE MANIPULATION UNDER ANESTHESIA;  Surgeon: Addie Cordella Hamilton, MD;  Location: [facility];  Service: Orthopedics;  Laterality: Right;   LAPAROSCOPIC ENDOMETRIOSIS FULGURATION  1998   LOOP RECORDER IMPLANT  02/05/2017   @[facility]  by dr f. al-khori;    (08-24-2023  per pt removed approx 2022)   TOTAL KNEE ARTHROPLASTY Right 07/05/2021   Procedure: TOTAL KNEE ARTHROPLASTY;  Surgeon: Addie Cordella Hamilton, MD;  Location: [facility] OR;  Service: Orthopedics;  Laterality: Right;   WISDOM TOOTH EXTRACTION  2007    Allergies  Allergen Reactions   Macrobid [Nitrofurantoin] Hives and Shortness Of Breath   Metformin  And Related Other (See Comments)    Abdominal cramping    Byetta 10 Mcg Pen [Exenatide] Hives   Clindamycin/Lincomycin Nausea And  Vomiting   Geodon [Ziprasidone Hcl]     Body starts shutting down   Hydromorphone  Itching    Per pt severe   Lipitor [Atorvastatin ]     Per patient this causes cramps   Nsaids     Heartburn   Penicillins Other (See Comments)    Childhood reaction Has patient had a PCN reaction causing immediate rash, facial/tongue/throat swelling, SOB or lightheadedness with hypotension: No Has patient had a PCN reaction causing severe rash involving mucus membranes or skin necrosis: No Has patient had a PCN reaction that required hospitalization No Has patient had a PCN reaction occurring within the last 10 years: No If all of the above answers are NO, then may proceed with Cephalosporin use.    Trulicity [Dulaglutide]     Abdominal pain    Avocado Diarrhea and Other (See Comments)    Severe cramping, sweats, and diarrhea in upper GI/stomach   Tramadol Hcl Itching    BP 127/67   Ht 5' 1.5 (1.562 m)   Wt 199 lb (90.3 kg)   BMI 36.99 kg/m       No data to display              No data to display              Objective:  Physical Exam:  Gen: NAD, comfortable in exam room  Left knee exam not repeated today  Bilateral hands: Some dorsal nodularity DIP, PIP joints consistent with [place] and Bouchards nodes.  No malrotation, angulation, warmth, redness. FROM No tenderness to palpation. NVI distally.  Assessment and Plan:  Left knee arthritis - third gelsyn injection given today.  Tylenol , voltaren  gel, home exercises.  Follow up as needed.  After informed written consent timeout was performed, patient was lying supine on exam table. Left knee was prepped with alcohol swab and utilizing superolateral approach with ultrasound guidance, patient's left knee was injected intraarticularly  with 3mL lidocaine  followed by gelsyn. Patient tolerated the procedure well without immediate complications.  2. Bilateral hand pain - Discussed location of her pain and exam consistent with  osteoarthritis though she's had more severe pain than would expect with this condition.  Prior history of carpal tunnel and trigger fingers though current pain is different and more consistent with osteoarthritis vs inflammatory arthritis.  Will refer to rheumatology to consider workup, rule-out inflammatory condition.

## 2024-11-04 ENCOUNTER — Encounter: Payer: Self-pay | Admitting: Family Medicine

## 2024-11-04 NOTE — Telephone Encounter (Signed)
 Called patient.  She is feeling better compared to early this morning.  She's unable to tolerate wrist braces overnight.  Was sleeping on back and not on her arms when she woke up with this.  No bowel/bladder dysfunction.  Had recent rheumatologic labwork last month that was negative.  MRI cervical spine a year ago without anything compressive to account for these symptoms.  NCV/EMG was in March - following with neurology and has a call in to them as well.  Can consider prednisone  dose pack again though would like to avoid if possible with history of DM and prior usage.

## 2024-11-10 ENCOUNTER — Encounter: Payer: Self-pay | Admitting: *Deleted

## 2024-12-04 ENCOUNTER — Other Ambulatory Visit: Payer: Self-pay | Admitting: Family Medicine

## 2025-01-12 ENCOUNTER — Ambulatory Visit: Admitting: Rheumatology

## 2025-02-24 ENCOUNTER — Ambulatory Visit: Admitting: Rheumatology
